# Patient Record
Sex: Female | Born: 1965 | Race: White | Hispanic: Refuse to answer | Marital: Married | Smoking: Current every day smoker
Health system: Southern US, Community
[De-identification: ages and names within clinical notes are randomized; demographics above are authoritative.]

## PROBLEM LIST (undated history)

## (undated) DIAGNOSIS — Z923 Personal history of irradiation: Secondary | ICD-10-CM

## (undated) DIAGNOSIS — F419 Anxiety disorder, unspecified: Secondary | ICD-10-CM

## (undated) DIAGNOSIS — E785 Hyperlipidemia, unspecified: Secondary | ICD-10-CM

## (undated) DIAGNOSIS — F319 Bipolar disorder, unspecified: Secondary | ICD-10-CM

## (undated) DIAGNOSIS — I209 Angina pectoris, unspecified: Secondary | ICD-10-CM

## (undated) DIAGNOSIS — J45909 Unspecified asthma, uncomplicated: Secondary | ICD-10-CM

## (undated) DIAGNOSIS — I2510 Atherosclerotic heart disease of native coronary artery without angina pectoris: Secondary | ICD-10-CM

## (undated) DIAGNOSIS — C50919 Malignant neoplasm of unspecified site of unspecified female breast: Secondary | ICD-10-CM

## (undated) DIAGNOSIS — Z9221 Personal history of antineoplastic chemotherapy: Secondary | ICD-10-CM

## (undated) DIAGNOSIS — K859 Acute pancreatitis without necrosis or infection, unspecified: Secondary | ICD-10-CM

## (undated) DIAGNOSIS — E1169 Type 2 diabetes mellitus with other specified complication: Secondary | ICD-10-CM

## (undated) DIAGNOSIS — E119 Type 2 diabetes mellitus without complications: Secondary | ICD-10-CM

## (undated) DIAGNOSIS — I890 Lymphedema, not elsewhere classified: Secondary | ICD-10-CM

## (undated) DIAGNOSIS — I82401 Acute embolism and thrombosis of unspecified deep veins of right lower extremity: Secondary | ICD-10-CM

## (undated) DIAGNOSIS — M1990 Unspecified osteoarthritis, unspecified site: Secondary | ICD-10-CM

## (undated) DIAGNOSIS — I219 Acute myocardial infarction, unspecified: Secondary | ICD-10-CM

## (undated) DIAGNOSIS — G4730 Sleep apnea, unspecified: Secondary | ICD-10-CM

## (undated) DIAGNOSIS — E669 Obesity, unspecified: Secondary | ICD-10-CM

## (undated) HISTORY — DX: Acute pancreatitis without necrosis or infection, unspecified: K85.90

## (undated) HISTORY — DX: Unspecified osteoarthritis, unspecified site: M19.90

## (undated) HISTORY — PX: UMBILICAL HERNIA REPAIR: SHX196

## (undated) HISTORY — DX: Anxiety disorder, unspecified: F41.9

## (undated) HISTORY — DX: Sleep apnea, unspecified: G47.30

## (undated) HISTORY — PX: TUBAL LIGATION: SHX77

## (undated) HISTORY — PX: TRIGGER FINGER RELEASE: SHX641

## (undated) HISTORY — PX: CATARACT EXTRACTION: SUR2

## (undated) HISTORY — PX: CHOLECYSTECTOMY: SHX55

## (undated) HISTORY — PX: OTHER SURGICAL HISTORY: SHX169

## (undated) HISTORY — DX: Lymphedema, not elsewhere classified: I89.0

## (undated) NOTE — *Deleted (*Deleted)
PROGRESS NOTE    Ariana Herrera   NKN:[id number]  DOB: [birth year]  DOA: 08/20/2020 PCP: Ariana Sellar, MD   Brief Narrative:  Ariana Herrera a 62 y.o.femalewith medical history significant ofCAD andbreastcancertreatedwith chemo/rads and with residual lymphedema presenting with abdominal pain.She reports she was awakened overnight with severe abdominal pain. It got worse and worse for about 2 hours and her boyfriend called 911. +n/v. She felt ok yesterday with mild cramping. She is still having severe substernal pain that radiates into her back. Nothing makes it worse, better with pain meds but it is short-lived. No h/o similar prior. She is very nauseated. She did have an MI in the past (maybe 4 years ago) - presented with chest discomfort, told it might be GERD and they discharged her. About 1-2 days later she went back and they said a stent was not needed but it was a heart attack.   Of note, she was seen at Urgent Care 2 days ago with R sided shoulder pain and chest pain. She took NTG x 2 for this due to concern that it was a heart attack. She reports being told that it was a shoulder strain for which she was given prednisone. She took the 60 mg prednisone yesterday for the first time and symptoms developed overnight. Her only other pain medication was Norco, which she takes regularly for pain. Denies NSAIDs use.    Subjective: Abdominal pain improved. States she is very constipated.    Assessment & Plan:   Principal Problem:   NSTEMI (non-ST elevated myocardial infarction)   - vs related to acute pancreatitis  Ref. Range 08/20/2020 10:27 08/20/2020 13:32 08/20/2020 15:03  Troponin I (High Sensitivity) Latest Ref Range: <18 ng/L 1,534 (HH) 1,529 (HH) 1,415 [facility])  Cardiac Cath 10/29:  Prox RCA lesion is 30% stenosed.  Mid RCA lesion is 40% stenosed.  Mid RCA to Dist RCA lesion is 20% stenosed.  Ost LAD to Prox LAD lesion is 20%  stenosed.   1. Mild non-obstructive CAD 2. Elevated left filling pressures.  - ECHO noted below is unremarkable - no symptoms or signs of vasculitis/ sepsis/ shock/ rhabdomyolysis/ CVA - no history of Cocaine abuse  - no further work up at this time per Cards- will f/u as outpt  Active Problems:     Pancreatitis, acute - presenting with abdominal pain- CT correlated with acute pancreatitis and fatty liver (No h/o ETOH abuse) - having ongoing pain after being started on diet and asking for pain medications - downgraded to NPO- pain improved- will place on clears - advised to have fat free clears only- place on maintenance fluids  Constipation - Dulcolax suppository and Miralax ordered - Fleet enema if no BM today  Hypomagnesemia - replaced-  Labs not drawn today.  Fatty liver  - discussed findings and discussed need for weigh loss with patient  Dyslipidemia- cont Tricor - also related to uncontrolled DM Total CHOL/HDL Ratio    4.7           Total CHOL/HDL Ratio     Cholesterol    135           Cholesterol    HDL Cholesterol    29           HDL Cholesterol    LDL (calc)    61           LDL (calc)    Triglycerides    227  Triglycerides    VLDL    45           VLDL    CBC     Splenic lesion - see CTA report below - non urgent outpatient MRI can be pursued     Diabetes mellitus type 2 in obese (HCC)   - Uncontrolled   - A1c 10.8 - on Metformin at home- will need hold stop Trulicity - cont Lantus- following glucose -  she may benefit from short acting insulin with meals as outpatient     Breast cancer (HCC)  - cont outpt follow up  Time spent in minutes: 35 DVT prophylaxis: SCD's Start: 08/22/20 1322  Code Status: full code Family Communication:  Disposition Plan:  Status is: Inpatient  Remains inpatient appropriate because:acute pancreatitis   Dispo: The patient is from: Home              Anticipated d/c is to: Home              Anticipated d/c  date is: > 3 days              Patient currently is not medically stable to d/c.      Consultants:   cardiology Procedures:  10/28- 2 D ECHO 1. Left ventricular ejection fraction, by estimation, is 60 to 65%. The  left ventricle has normal function. The left ventricle has no regional  wall motion abnormalities. Left ventricular diastolic parameters were  normal.  2. Right ventricular systolic function is normal. The right ventricular  size is normal. Tricuspid regurgitation signal is inadequate for assessing  PA pressure.  3. The mitral valve is normal in structure. Trivial mitral valve  regurgitation. No evidence of mitral stenosis.  4. The aortic valve is tricuspid. Aortic valve regurgitation is not  visualized. No aortic stenosis is present.  5. The inferior vena cava is normal in size with greater than 50%  respiratory variability, suggesting right atrial pressure of 3 mmHg.  Antimicrobials:  Anti-infectives (From admission, onward)   None       Objective: Vitals:   08/24/20 2038 08/25/20 0342 08/25/20 0919 08/25/20 1109  BP: 124/75 127/77 (!) 147/90 123/82  Pulse: 86 88 92 92  Resp: 20 16 18 20   Temp: 98.8 F (37.1 C) 98.7 F (37.1 C) 97.8 F (36.6 C) 98.4 F (36.9 C)  TempSrc: Oral Oral Oral Oral  SpO2: 95% 97% 99% 97%  Weight:  72.7 kg    Height:        Intake/Output Summary (Last 24 hours) at 08/25/2020 1633 Last data filed at 08/25/2020 1606 Gross per 24 hour  Intake 3379.96 ml  Output 3625 ml  Net -245.04 ml   Filed Weights   08/23/20 0102 08/24/20 0059 08/25/20 0342  Weight: 73.8 kg 72.4 kg 72.7 kg    Examination: General exam: Appears comfortable  HEENT: PERRLA, oral mucosa moist, no sclera icterus or thrush Respiratory system: Clear to auscultation. Respiratory effort normal. Cardiovascular system: S1 & S2 heard,  No murmurs  Gastrointestinal system: Abdomen soft, epigastric tenderness today, nondistended. Normal bowel sounds    Central nervous system: Alert and oriented. No focal neurological deficits. Extremities: No cyanosis, clubbing or edema Skin: No rashes or ulcers Psychiatry:  Mood & affect appropriate.     Data Reviewed: I have personally reviewed following labs and imaging studies  CBC: Recent Labs  Lab 08/20/20 1027 08/20/20 1040 08/21/20 0558 08/22/20 0016 08/23/20 0558  WBC 12.8*  --  9.2  6.3 5.1  HGB 13.2 13.6 12.2 10.7* 11.9*  HCT 38.3 40.0 35.7* 31.5* 35.2*  MCV 89.3  --  88.8 89.2 90.5  PLT 408*  --  327 263 273   Basic Metabolic Panel: Recent Labs  Lab 08/21/20 0558 08/22/20 0016 08/23/20 0558 08/24/20 1454 08/25/20 0954  NA 136 137 137 136 137  K 3.6 3.3* 3.8 5.1 5.4*  CL 106 106 106 105 109  CO2 24 22 22 23 22   GLUCOSE 103* 68* 173* 285* 209*  BUN 9 6 5* 5* <5*  CREATININE 0.71 0.66 0.77 0.64 0.58  CALCIUM 8.6* 8.6* 9.0 8.6* 8.5*  MG 1.7 1.5* 1.5* 1.3* 1.2*   GFR: Estimated Creatinine Clearance: 77.6 mL/min (by C-G formula based on SCr of 0.58 mg/dL). Liver Function Tests: Recent Labs  Lab 08/20/20 1223 08/23/20 0558  AST 23 13*  ALT 23 15  ALKPHOS 72 60  BILITOT 0.9 0.8  PROT 6.1* 5.8*  ALBUMIN 3.4* 2.8*   Recent Labs  Lab 08/20/20 1223 08/22/20 0016 08/23/20 0558  LIPASE 148* 57* 48   No results for input(s): AMMONIA in the last 168 hours. Coagulation Profile: Recent Labs  Lab 08/20/20 1027 08/21/20 0558  INR 1.0 1.1   Cardiac Enzymes: No results for input(s): CKTOTAL, CKMB, CKMBINDEX, TROPONINI in the last 168 hours. BNP (last 3 results) No results for input(s): PROBNP in the last 8760 hours. HbA1C: No results for input(s): HGBA1C in the last 72 hours. CBG: Recent Labs  Lab 08/24/20 1720 08/24/20 2114 08/25/20 0615 08/25/20 1110 08/25/20 1605  GLUCAP 167* 162* 190* 221* 187*   Lipid Profile: No results for input(s): CHOL, HDL, LDLCALC, TRIG, CHOLHDL, LDLDIRECT in the last 72 hours. Thyroid Function Tests: No results for input(s):  TSH, T4TOTAL, FREET4, T3FREE, THYROIDAB in the last 72 hours. Anemia Panel: No results for input(s): VITAMINB12, FOLATE, FERRITIN, TIBC, IRON, RETICCTPCT in the last 72 hours. Urine analysis:    Component Value Date/Time   COLORURINE STRAW (A) 08/25/2020 1221   APPEARANCEUR CLEAR 08/25/2020 1221   LABSPEC 1.004 (L) 08/25/2020 1221   PHURINE 6.0 08/25/2020 1221   GLUCOSEU 50 (A) 08/25/2020 1221   HGBUR NEGATIVE 08/25/2020 1221   BILIRUBINUR NEGATIVE 08/25/2020 1221   KETONESUR NEGATIVE 08/25/2020 1221   PROTEINUR NEGATIVE 08/25/2020 1221   NITRITE NEGATIVE 08/25/2020 1221   LEUKOCYTESUR NEGATIVE 08/25/2020 1221   Sepsis Labs: @LABRCNTIP (procalcitonin:4,lacticidven:4) ) Recent Results (from the past 240 hour(s))  Respiratory Panel by RT PCR (Flu A&B, Covid) - Nasopharyngeal Swab     Status: None   Collection Time: 08/20/20 11:47 AM   Specimen: Nasopharyngeal Swab  Result Value Ref Range Status   SARS Coronavirus 2 by RT PCR NEGATIVE NEGATIVE Final    Comment: (NOTE) SARS-CoV-2 target nucleic acids are NOT DETECTED.  The SARS-CoV-2 RNA is generally detectable in upper respiratoy specimens during the acute phase of infection. The lowest concentration of SARS-CoV-2 viral copies this assay can detect is 131 copies/mL. A negative result does not preclude SARS-Cov-2 infection and should not be used as the sole basis for treatment or other patient management decisions. A negative result may occur with  improper specimen collection/handling, submission of specimen other than nasopharyngeal swab, presence of viral mutation(s) within the areas targeted by this assay, and inadequate number of viral copies (<131 copies/mL). A negative result must be combined with clinical observations, patient history, and epidemiological information. The expected result is Negative.  Fact Sheet for Patients:  https://www.moore.com/  Fact Sheet for Healthcare  Providers:   https://www.young.biz/  This test is no t yet approved or cleared by the [place] and  has been authorized for detection and/or diagnosis of SARS-CoV-2 by FDA under an Emergency Use Authorization (EUA). This EUA will remain  in effect (meaning this test can be used) for the duration of the COVID-19 declaration under Section 564(b)(1) of the Act, 21 U.S.C. section 360bbb-3(b)(1), unless the authorization is terminated or revoked sooner.     Influenza A by PCR NEGATIVE NEGATIVE Final   Influenza B by PCR NEGATIVE NEGATIVE Final    Comment: (NOTE) The Xpert Xpress SARS-CoV-2/FLU/RSV assay is intended as an aid in  the diagnosis of influenza from Nasopharyngeal swab specimens and  should not be used as a sole basis for treatment. Nasal washings and  aspirates are unacceptable for Xpert Xpress SARS-CoV-2/FLU/RSV  testing.  Fact Sheet for Patients: https://www.moore.com/  Fact Sheet for Healthcare Providers: https://www.young.biz/  This test is not yet approved or cleared by the [place] FDA and  has been authorized for detection and/or diagnosis of SARS-CoV-2 by  FDA under an Emergency Use Authorization (EUA). This EUA will remain  in effect (meaning this test can be used) for the duration of the  Covid-19 declaration under Section 564(b)(1) of the Act, 21  U.S.C. section 360bbb-3(b)(1), unless the authorization is  terminated or revoked. Performed at [facility], [address], [place], [place] [address]          Radiology Studies: No results found.    Scheduled Meds: . aspirin EC  81 mg Oral Daily  . atorvastatin  40 mg Oral Daily  . HYDROcodone-acetaminophen  1 tablet Oral TID  . insulin aspart  0-15 Units Subcutaneous TID WC  . insulin glargine  6 Units Subcutaneous QHS  . losartan  25 mg Oral Daily  . metoprolol succinate  25 mg Oral Daily  . nicotine  21 mg Transdermal Daily  .  pantoprazole (PROTONIX) IV  40 mg Intravenous Q12H  . pneumococcal 23 valent vaccine  0.5 mL Intramuscular Tomorrow-1000  . polyethylene glycol  17 g Oral Daily  . QUEtiapine  800 mg Oral QHS  . sodium chloride flush  3 mL Intravenous Q12H   Continuous Infusions: . sodium chloride       LOS: 5 days      Calvert Cantor, MD [facility] Pager: [email or web address] 08/25/2020, 4:33 PM

---

## 2019-03-26 HISTORY — PX: BREAST LUMPECTOMY: SHX2

## 2019-11-09 ENCOUNTER — Telehealth: Payer: Self-pay | Admitting: Hematology

## 2019-11-09 NOTE — Telephone Encounter (Signed)
Unable to reach patient to inform of new patient appointment 1/26 at 12 pm. Mailed appointment letter 11/09/19. Call [facility] to inform referral coordinator of appt date/time/location. Also, informed them that we will need patient records from previous oncologist per MD request.

## 2019-11-20 ENCOUNTER — Inpatient Hospital Stay: Payer: Medicaid Other

## 2019-11-20 ENCOUNTER — Ambulatory Visit: Payer: Medicaid Other | Admitting: Hematology

## 2019-11-26 ENCOUNTER — Other Ambulatory Visit: Payer: Self-pay | Admitting: *Deleted

## 2019-11-26 ENCOUNTER — Inpatient Hospital Stay (HOSPITAL_BASED_OUTPATIENT_CLINIC_OR_DEPARTMENT_OTHER): Payer: Medicaid Other | Admitting: Hematology

## 2019-11-26 ENCOUNTER — Inpatient Hospital Stay: Payer: Medicaid Other | Attending: Hematology

## 2019-11-26 ENCOUNTER — Other Ambulatory Visit: Payer: Self-pay

## 2019-11-26 ENCOUNTER — Encounter: Payer: Self-pay | Admitting: Hematology

## 2019-11-26 ENCOUNTER — Inpatient Hospital Stay: Payer: Medicaid Other

## 2019-11-26 ENCOUNTER — Inpatient Hospital Stay: Payer: Medicaid Other | Admitting: Hematology

## 2019-11-26 VITALS — BP 133/76 | HR 108 | Temp 97.9°F | Resp 18 | Ht 62.0 in | Wt 151.1 lb

## 2019-11-26 DIAGNOSIS — T451X5A Adverse effect of antineoplastic and immunosuppressive drugs, initial encounter: Secondary | ICD-10-CM | POA: Diagnosis not present

## 2019-11-26 DIAGNOSIS — C50912 Malignant neoplasm of unspecified site of left female breast: Secondary | ICD-10-CM | POA: Insufficient documentation

## 2019-11-26 DIAGNOSIS — Z9221 Personal history of antineoplastic chemotherapy: Secondary | ICD-10-CM | POA: Diagnosis not present

## 2019-11-26 DIAGNOSIS — Z720 Tobacco use: Secondary | ICD-10-CM

## 2019-11-26 DIAGNOSIS — Z171 Estrogen receptor negative status [ER-]: Secondary | ICD-10-CM

## 2019-11-26 DIAGNOSIS — C50919 Malignant neoplasm of unspecified site of unspecified female breast: Secondary | ICD-10-CM | POA: Insufficient documentation

## 2019-11-26 DIAGNOSIS — Z79899 Other long term (current) drug therapy: Secondary | ICD-10-CM | POA: Diagnosis not present

## 2019-11-26 DIAGNOSIS — F17210 Nicotine dependence, cigarettes, uncomplicated: Secondary | ICD-10-CM | POA: Diagnosis not present

## 2019-11-26 DIAGNOSIS — Z923 Personal history of irradiation: Secondary | ICD-10-CM | POA: Insufficient documentation

## 2019-11-26 DIAGNOSIS — I824Y1 Acute embolism and thrombosis of unspecified deep veins of right proximal lower extremity: Secondary | ICD-10-CM | POA: Diagnosis not present

## 2019-11-26 DIAGNOSIS — Z7984 Long term (current) use of oral hypoglycemic drugs: Secondary | ICD-10-CM | POA: Diagnosis not present

## 2019-11-26 DIAGNOSIS — Z86718 Personal history of other venous thrombosis and embolism: Secondary | ICD-10-CM | POA: Insufficient documentation

## 2019-11-26 DIAGNOSIS — Z7901 Long term (current) use of anticoagulants: Secondary | ICD-10-CM | POA: Diagnosis not present

## 2019-11-26 DIAGNOSIS — G620 Drug-induced polyneuropathy: Secondary | ICD-10-CM | POA: Insufficient documentation

## 2019-11-26 DIAGNOSIS — I82401 Acute embolism and thrombosis of unspecified deep veins of right lower extremity: Secondary | ICD-10-CM | POA: Insufficient documentation

## 2019-11-26 LAB — CBC WITH DIFFERENTIAL (CANCER CENTER ONLY)
Abs Immature Granulocytes: 0.04 10*3/uL (ref 0.00–0.07)
Basophils Absolute: 0 10*3/uL (ref 0.0–0.1)
Basophils Relative: 1 %
Eosinophils Absolute: 0.1 10*3/uL (ref 0.0–0.5)
Eosinophils Relative: 1 %
HCT: 40.6 % (ref 36.0–46.0)
Hemoglobin: 14.1 g/dL (ref 12.0–15.0)
Immature Granulocytes: 1 %
Lymphocytes Relative: 25 %
Lymphs Abs: 1.8 10*3/uL (ref 0.7–4.0)
MCH: 31.4 pg (ref 26.0–34.0)
MCHC: 34.7 g/dL (ref 30.0–36.0)
MCV: 90.4 fL (ref 80.0–100.0)
Monocytes Absolute: 0.5 10*3/uL (ref 0.1–1.0)
Monocytes Relative: 7 %
Neutro Abs: 4.8 10*3/uL (ref 1.7–7.7)
Neutrophils Relative %: 65 %
Platelet Count: 313 10*3/uL (ref 150–400)
RBC: 4.49 MIL/uL (ref 3.87–5.11)
RDW: 13.2 % (ref 11.5–15.5)
WBC Count: 7.2 10*3/uL (ref 4.0–10.5)
nRBC: 0 % (ref 0.0–0.2)

## 2019-11-26 LAB — CMP (CANCER CENTER ONLY)
ALT: 13 U/L (ref 0–44)
AST: 12 U/L — ABNORMAL LOW (ref 15–41)
Albumin: 4 g/dL (ref 3.5–5.0)
Alkaline Phosphatase: 123 U/L (ref 38–126)
Anion gap: 9 (ref 5–15)
BUN: 8 mg/dL (ref 6–20)
CO2: 25 mmol/L (ref 22–32)
Calcium: 9.4 mg/dL (ref 8.9–10.3)
Chloride: 104 mmol/L (ref 98–111)
Creatinine: 0.72 mg/dL (ref 0.44–1.00)
GFR, Est AFR Am: >60 mL/min (ref >60)
GFR, Estimated: >60 mL/min (ref >60)
Glucose, Bld: 175 mg/dL — ABNORMAL HIGH (ref 70–99)
Potassium: 4.5 mmol/L (ref 3.5–5.1)
Sodium: 138 mmol/L (ref 135–145)
Total Bilirubin: 0.3 mg/dL (ref 0.3–1.2)
Total Protein: 6.8 g/dL (ref 6.5–8.1)

## 2019-11-26 NOTE — Progress Notes (Addendum)
Ariana Herrera CONSULT NOTE  Patient Care Team: Ariana Sellar, MD as PCP - General (General Practice)  HEME/ONC OVERVIEW: 1. 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 RLE DVT -07/2018: acute DVT in the R gastrocnemius vein with slight extension to the popliteal vein  On Eliquis 7m BID  2. 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  She underwent left lumpectomy with sentinel lymph node biopsy on 07/18/2018.  Pathology showed invasive ductal carcinoma, Grade 3, tumor measuring 2.1 cm with negative margins and sentinel lymph node.  She received adjuvant Adriamycin/Cytoxan x4 cycles, followed by weekly Taxol x 10 cycles.  Her Taxol was  discontinued due to chemotherapy associated neuropathy.  She also received adjuvant radiation to the left breast and axilla, which she completed in late 02/2019.  Her surveillance mammogram in late May 2020 showed some questionable calcifications in the upper outer quadrant of the left breast, approximately 6 cm from the nipple, suspicious for malignancy.  She underwent a biopsy, which showed atrophic breast parenchyma with stromal fibrosis, and there was no atypical hyperplasia or malignancy.  [facility] and [facility] for [facility] of care. -I reviewed the imaging and pathology results in detail with the patient, as well as NCCN guideline -Given the triple negative breast cancer s/p adjuvant chemotherapy radiation, she does not require any further treatment, such as endocrine therapy, due to negative hormone and HER2 receptor status -As triple negative breast cancer is at slightly higher risk of disease recurrence then hormone positive breast cancer, I emphasized the importance of self breast exam and yearly surveillance mammogram (next due in 02/2019) -We will plan to see her q643monthwith breast exam x 5 years, and then annually -I also counseled the patient on the importance of maintaining an active lifestyle, healthy diet, limited alcohol intake, and tobacco cessation (see below)  RLE DVT -Diagnosed in 07/2018 in the distal right lower extremity -I reviewed with the patient about the plan for care for DVT  -This last episode of blood clot appeared to be provoked.  -Patient is tolerating Eliquis well without any abnormal bleeding or bruising -As she does not have a history of DVT or PTE, and her distal left lower extremity DVT was provoked in the setting of surgery and prolonged immobility, the duration of anticoagulation is generally 3-6 months -  I ordered Doppler of the right lower extremity, which was negative for DVT  -Therefore, I have  instructed the patient to stop anticoagulation  -I reinforced the importance of preventive strategies such as avoiding hormonal supplement, avoiding cigarette smoking, keeping up-to-date with screening programs for early cancer detection, frequent ambulation for long distance travel and aggressive DVT prophylaxis in all surgical settings.  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 -Neuropathy overall stable  -Currently on gabapentin -I have referred the patient to outpatient cancer rehab for physical therapy -I also encouraged patient to continue follow-up with her PCP for adjustment of her gabapentin -If her neuropathy worsens in the future, I encouraged her to discuss with her PCP regarding neurology referral for further management  Tobacco abuse -Patient currently smokes 1.5 to 2 ppd for past 40 years -I counseled the patient on the importance of tobacco cessation, especially in the setting of breast cancer and DVT -We discussed some of the pharmacologic interventions, including nicotine patch, gums, lozenges, and medications (such as Chantix) -Patient will discuss with her PCP for further management of tobacco cessation   Port-A-Cath in place -As patient has completed all her chemotherapy, and there is no evidence of recurrent disease, I have ordered the port to be removed  Orders Placed This Encounter  Procedures  . IR REMOVAL TUN ACCESS W/ PORT W/O FL MOD SED    Standing Status:   Future    Standing Expiration Date:   01/23/2021    Order Specific Question:   Reason for exam:    Answer:   Completed chemotherapy    Order Specific Question:   Preferred Imaging Location?    Answer:   [facility]    Order Specific Question:   Is the patient pregnant?    Answer:   No  . MM DIAG BREAST TOMO BILATERAL    Standing Status:   Future    Standing Expiration Date:   11/25/2020    Order Specific Question:   Reason for Exam (SYMPTOM  OR DIAGNOSIS REQUIRED)    Answer:    History of triple-negative breast cancer    Order Specific Question:   Is the patient pregnant?    Answer:   No    Order Specific Question:   Preferred imaging location?    Answer:   [facility]  . CBC with Differential (Cancer Center Only)    Standing Status:   Future    Standing Expiration Date:   12/30/2020  . CMP ([place] only)    Standing Status:   Future    Standing Expiration Date:   12/30/2020  . AMB referral to rehabilitation    Referral Priority:   Routine    Referral Type:   Consultation    Number of Visits Requested:   1    The total time spent in the appointment was 80 minutes encounter with patients including review of chart and various tests results, discussions about plan of care and coordination of care plan  All questions were answered. The patient knows to call the clinic with any problems, questions or concerns. No barriers to learning was detected.  Return in 6 months for labs, breast exam, and clinic appointment.   Tish Men, MD 2/1/20219:47 AM  CHIEF COMPLAINTS/PURPOSE OF CONSULTATION:  "I am doing okay*"  HISTORY OF PRESENTING ILLNESS:  Ariana Herrera 54 y.o. female is here because of history of Stage II triple negative breast cancer on surveillance.  Patient was diagnosed with triple-negative invasive ductal carcinoma of  the left breast in late 2019 in [place].  Staging scans, including CT and bone scan, were negative for metastatic disease.  She underwent left lumpectomy with sentinel lymph node biopsy on 07/18/2018.  Pathology showed invasive ductal carcinoma, Grade 3, tumor measuring 2.1 cm with negative margins and sentinel lymph node.  She received adjuvant Adriamycin/Cytoxan x4 cycles, followed by weekly Taxol x 10 cycles.  Her Taxol was discontinued due to chemotherapy associated neuropathy.  She also received adjuvant radiation to the left breast and axilla, which she completed in late 02/2019.  Her surveillance mammogram in late May 2020 showed  some questionable calcifications in the upper outer quadrant of the left breast, approximately 6 cm from the nipple, suspicious for malignancy.  She underwent a biopsy, which showed atrophic breast parenchyma with stromal fibrosis, and there was no atypical hyperplasia or malignancy.  [facility] after completing her treatment, and presented for establishment of care.  Patient reports that she has chronic numbness/tingling sensation in the fingertips and feet, as well as some burning sensation in the feet.  She also has had a few falls, usually with walking down stairs, due to "buckling of knee).  She also has some periodic bone aches, but her bone scan in 2020 was negative for any metastatic disease.  She takes Eliquis 5 mg BID for history of right lower extremity DVT, and denies any abnormal bleeding or bruising.  She currently smokes 1.5 to 2 ppds for the past 40 years.  She denies any other complaint today.  REVIEW OF SYSTEMS:   Constitutional: ( - ) fevers, ( - )  chills , ( - ) night sweats Eyes: ( - ) blurriness of vision, ( - ) double vision, ( - ) watery eyes Ears, nose, mouth, throat, and face: ( - ) mucositis, ( - ) sore throat Respiratory: ( - ) cough, ( - ) dyspnea, ( - ) wheezes Cardiovascular: ( - ) palpitation, ( - ) chest discomfort, ( - ) lower extremity swelling Gastrointestinal:  ( - ) nausea, ( - ) heartburn, ( - ) change in bowel habits Skin: ( - ) abnormal skin rashes Lymphatics: ( - ) new lymphadenopathy, ( - ) easy bruising Neurological: ( + ) numbness, ( + ) tingling, ( - ) new weaknesses Behavioral/Psych: ( - ) mood change, ( - ) new changes  All other systems were reviewed with the patient and are negative.  I have reviewed her chart and materials related to her cancer extensively and collaborated history with the patient. Summary of oncologic history is as follows: Oncology History  Breast cancer ([place])  11/26/2019 Initial Diagnosis   Breast cancer  ([place])   11/26/2019 Cancer Staging   Staging form: Breast, AJCC 8th Edition - Pathologic: Stage IIA (pT2, pN0, cM0, G3, ER-, PR-, HER2-) - Signed by Tish Men, MD on 11/26/2019     MEDICAL HISTORY:  History reviewed. No pertinent past medical history.  SURGICAL HISTORY: History reviewed. No pertinent surgical history.  SOCIAL HISTORY: Social History   Socioeconomic History  . Marital status: Unknown    Spouse name: Not on file  . Number of children: Not on file  . Years of education: Not on file  . Highest education level: Not on file  Occupational History  . Not on file  Tobacco Use  . Smoking status: Current Every Day Smoker    Packs/day: 2.00    Types: Cigarettes    Start date: 11/26/2019  . Smokeless  tobacco: Never Used  Substance and Sexual Activity  . Alcohol use: Not on file  . Drug use: Not on file  . Sexual activity: Not on file  Other Topics Concern  . Not on file  Social History Narrative  . Not on file   Social Determinants of Health   Financial Resource Strain:   . Difficulty of Paying Living Expenses: Not on file  Food Insecurity:   . Worried About Charity fundraiser in the Last Year: Not on file  . Ran Out of Food in the Last Year: Not on file  Transportation Needs:   . Lack of Transportation (Medical): Not on file  . Lack of Transportation (Non-Medical): Not on file  Physical Activity:   . Days of Exercise per Week: Not on file  . Minutes of Exercise per Session: Not on file  Stress:   . Feeling of Stress : Not on file  Social Connections:   . Frequency of Communication with Friends and Family: Not on file  . Frequency of Social Gatherings with Friends and Family: Not on file  . Attends Religious Services: Not on file  . Active Member of Clubs or Organizations: Not on file  . Attends Archivist Meetings: Not on file  . Marital Status: Not on file  Intimate Partner Violence:   . Fear of Current or Ex-Partner: Not on file  .  Emotionally Abused: Not on file  . Physically Abused: Not on file  . Sexually Abused: Not on file    FAMILY HISTORY: History reviewed. No pertinent family history.  ALLERGIES:  is allergic to lithium and penicillins.  MEDICATIONS:  Current Outpatient Medications  Medication Sig Dispense Refill  . apixaban (ELIQUIS) 5 MG TABS tablet Take 5 mg by mouth 2 (two) times daily.    . cyclobenzaprine (FLEXERIL) 10 MG tablet Take 10 mg by mouth 2 (two) times daily.    Marland Kitchen gabapentin (NEURONTIN) 800 MG tablet Take 800 mg by mouth 3 (three) times daily.    Marland Kitchen glipiZIDE (GLUCOTROL) 10 MG tablet Take 10 mg by mouth 2 (two) times daily before a meal.    . HYDROcodone-acetaminophen (NORCO/VICODIN) 5-325 MG tablet Take 1 tablet by mouth 2 (two) times daily.    Marland Kitchen ibuprofen (ADVIL) 400 MG tablet Take 400 mg by mouth 3 (three) times daily.    . metFORMIN (GLUCOPHAGE) 1000 MG tablet Take 1,000 mg by mouth 2 (two) times daily with a meal.    . omeprazole (PRILOSEC) 40 MG capsule Take 40 mg by mouth daily.     No current facility-administered medications for this visit.    PHYSICAL EXAMINATION: ECOG PERFORMANCE STATUS: 1 - Symptomatic but completely ambulatory  Vitals:   11/26/19 0904  BP: 133/76  Pulse: (!) 108  Resp: 18  Temp: 97.9 F (36.6 C)  SpO2: 98%   Filed Weights   11/26/19 0904  Weight: 151 lb 1.3 oz (68.5 kg)    GENERAL: alert, no distress and comfortable SKIN: skin color, texture, turgor are normal, no rashes or significant lesions EYES: conjunctiva are pink and non-injected, sclera clear OROPHARYNX: no exudate, no erythema; lips, buccal mucosa, and tongue normal  NECK: supple, non-tender LYMPH:  no palpable lymphadenopathy in the cervical LUNGS: clear to auscultation with normal breathing effort HEART: regular rate & rhythm, no murmurs, no lower extremity edema ABDOMEN: soft, non-tender, non-distended, normal bowel sounds Musculoskeletal: no cyanosis of digits and no clubbing   PSYCH: alert & oriented x 3, fluent speech  BREAST: left lumpectomy scar well healed, no abnormal mass palpated in either breast, no axillary adenopathy  LABORATORY DATA:  I have reviewed the data as listed Lab Results  Component Value Date   WBC 7.2 11/26/2019   HGB 14.1 11/26/2019   HCT 40.6 11/26/2019   MCV 90.4 11/26/2019   PLT 313 11/26/2019   Lab Results  Component Value Date   NA 138 11/26/2019   K 4.5 11/26/2019   CL 104 11/26/2019   CO2 25 11/26/2019    RADIOGRAPHIC STUDIES: I have personally reviewed the radiological images as listed and agreed with the findings in the report. No results found.  PATHOLOGY: I have reviewed the pathology reports as documented in the oncologist history.

## 2019-11-28 ENCOUNTER — Ambulatory Visit: Payer: Medicaid Other | Admitting: Physical Therapy

## 2019-11-29 ENCOUNTER — Ambulatory Visit (HOSPITAL_BASED_OUTPATIENT_CLINIC_OR_DEPARTMENT_OTHER)
Admission: RE | Admit: 2019-11-29 | Discharge: 2019-11-29 | Disposition: A | Discharge status: Home / Self Care | Payer: Medicaid Other | Source: Ambulatory Visit | Attending: Hematology | Admitting: Hematology

## 2019-11-29 ENCOUNTER — Other Ambulatory Visit: Payer: Self-pay

## 2019-11-29 DIAGNOSIS — I824Y1 Acute embolism and thrombosis of unspecified deep veins of right proximal lower extremity: Secondary | ICD-10-CM | POA: Insufficient documentation

## 2019-11-29 NOTE — Progress Notes (Signed)
VAS [place] LOWER EXTREMITY VENOUS BILAT (DVT)  11/29/19 Cardell Peach RDCS, RVT

## 2019-11-30 ENCOUNTER — Telehealth: Payer: Self-pay | Admitting: *Deleted

## 2019-11-30 NOTE — Telephone Encounter (Signed)
-----   Message from Tish Men, MD sent at 11/30/2019  9:06 AM EST ----- Graceann Congress,  Can you let Ms. Jones know that her doppler was negative for DVT, and she can stop her anticoagulation?Thanks.  GZ  ----- Message ----- From: Interface, [employer] Sent: 11/29/2019   5:34 PM EST To: Tish Men, MD

## 2019-11-30 NOTE — Telephone Encounter (Signed)
Unable to reach pt, no working phone numbers on file

## 2019-12-03 ENCOUNTER — Ambulatory Visit: Payer: Medicaid Other | Attending: Hematology | Admitting: Rehabilitation

## 2019-12-03 ENCOUNTER — Other Ambulatory Visit: Payer: Self-pay

## 2019-12-03 ENCOUNTER — Encounter: Payer: Self-pay | Admitting: Rehabilitation

## 2019-12-03 DIAGNOSIS — M6281 Muscle weakness (generalized): Secondary | ICD-10-CM | POA: Insufficient documentation

## 2019-12-03 DIAGNOSIS — T451X5A Adverse effect of antineoplastic and immunosuppressive drugs, initial encounter: Secondary | ICD-10-CM | POA: Diagnosis present

## 2019-12-03 DIAGNOSIS — G620 Drug-induced polyneuropathy: Secondary | ICD-10-CM | POA: Diagnosis present

## 2019-12-03 DIAGNOSIS — R296 Repeated falls: Secondary | ICD-10-CM | POA: Diagnosis present

## 2019-12-03 NOTE — Therapy (Signed)
[place], [place], [address] Phone: [phone]   Fax:  [phone]  Physical Therapy Treatment  Patient Details  Name: Ariana Herrera MRN: [id number] Date of Birth: 19-Feb-1966 Referring Provider (PT): Dr. Maylon Peppers   Encounter Date: 12/03/2019  PT End of Session - 12/03/19 1150    Visit Number  1    Number of Visits  13   POC visits 13   Date for PT Re-Evaluation  01/14/20    Authorization Type  Medicaid    PT Start Time  1100    PT Stop Time  1150    PT Time Calculation (min)  50 min    Activity Tolerance  Patient tolerated treatment well    Behavior During Therapy  [facility] for tasks assessed/performed       History reviewed. No pertinent past medical history.  History reviewed. No pertinent surgical history.  There were no vitals filed for this visit.  Subjective Assessment - 12/03/19 1103    Subjective  The numbness and tingling started with the taxotere,  I had to use the walker and my knees started giving out.  It has gotten worse since then.  My hands and the from the knees down are completely numb and 1-2x per week I go numb from the neck down.    Pertinent History  DM, bipolar disorder, high cholesterol, asthma, history of blood clots, breast cancer treatment completed lumpectomy on the left with SLNB 0/1 nodes removed.  Chemo completed except for 3 sessions and radiation completed all in [place].    Currently in Pain?  No/denies   It only hurts when I'm walking up to 8/10 both feet        [facility] PT Assessment - 12/03/19 0001      Assessment   Medical Diagnosis  CIPN    Referring Provider (PT)  Dr. Maylon Peppers    Onset Date/Surgical Date  07/18/18    Hand Dominance  Right    Prior Therapy  no      Precautions   Precaution Comments  fall       Balance Screen   Has the patient fallen in the past 6 months  Yes    How many times?  5    Has the patient had a decrease in activity level because  of a fear of falling?   Yes    Is the patient reluctant to leave their home because of a fear of falling?   Yes      [place]  Private residence    Living Arrangements  Spouse/significant other    Available Help at Discharge  Friend(s)    [place] to enter    Entrance Stairs-Number of Steps  3   I go [place]  One level    Additional Comments  I can only go down the stairs sideways because my knees give out      Prior Function   Level of Independence  Independent    Vocation  On disability      Cognition   Overall Cognitive Status  Within Functional Limits for tasks assessed      Sensation   Additional Comments  not able to feel monofilament Rt LE: toes 1-3 and anterior shin, LT LE: toes 2-3 and anterior shin      Coordination   Gross Motor Movements are Fluid and Coordinated  No    Coordination  and Movement Description  overall shakiness with LE gross movements    Finger Nose Finger Test  WNL    Heel Shin Test  WNL      ROM / Strength   AROM / PROM / Strength  Strength      Strength   Overall Strength Comments  SLR Rt lag of about 10deg, left lag of about 10deg with lots of shaking    Strength Assessment Site  Hip;Knee;Ankle    Right/Left Hip  Right;Left    Right Hip ABduction  4+/5   shaky   Left Hip ABduction  5/5    Right/Left Knee  Right;Left    Right Knee Flexion  4/5   slight cogwheel   Right Knee Extension  4+/5    Left Knee Flexion  4/5   slight cogwheel   Left Knee Extension  4+/5    Right/Left Ankle  Right;Left    Right Ankle Dorsiflexion  5/5    Right Ankle Plantar Flexion  4/5   gets arch cramps with PF bilaterally   Left Ankle Dorsiflexion  5/5    Left Ankle Plantar Flexion  4/5      Ambulation/Gait   Gait Comments  walks without evident deviation      Standardized Balance Assessment   Standardized Balance Assessment  Timed Up and Go Test;Berg Balance Test      Berg Balance Test   Sit to  Stand  Able to stand using hands after several tries    Standing Unsupported  Able to stand safely 2 minutes    Sitting with Back Unsupported but Feet Supported on Floor or Stool  Able to sit safely and securely 2 minutes    Stand to Sit  Sits safely with minimal use of hands    Transfers  Able to transfer safely, definite need of hands    Standing Unsupported with Eyes Closed  Able to stand 10 seconds with supervision    Standing Unsupported with Feet Together  Able to place feet together independently and stand for 1 minute with supervision    From Standing, Reach Forward with Outstretched Arm  Can reach confidently >25 cm (10")    From Standing Position, Pick up Object from Floor  Unable to pick up and needs supervision    From Standing Position, Turn to Look Behind Over each Shoulder  Needs assist to keep from losing balance and falling    Turn 360 Degrees  Needs assistance while turning    Standing Unsupported, Alternately Place Feet on Step/Stool  Able to complete >2 steps/needs minimal assist    Standing Unsupported, One Foot in [employer] balance while stepping or standing    Standing on One Leg  Unable to try or needs assist to prevent fall    Total Score  29                           PT Education - 12/03/19 1150    Education Details  high fall risk, walker use, POC, seated exercises to begin    Person(s) Educated  Patient    Methods  Explanation;Demonstration;Verbal cues;Handout    Comprehension  Verbalized understanding          PT Long Term Goals - 12/03/19 1201      PT LONG TERM GOAL #1   Title  Pt will improve BERG to at least 45/56    Baseline  26/56    Time  6    Period  Weeks    Status  New      PT LONG TERM GOAL #2   Title  Pt will demonstrate SLR with no quad lag and shakiness bilaterally    Time  6    Period  Weeks    Status  New      PT LONG TERM GOAL #3   Title  Pt will perform stairs or step up/down in the clinic safely with no  knee buckling    Time  6    Period  Weeks    Status  New      PT LONG TERM GOAL #4   Title  pt will be ind with LRAD as needed for ambulation    Time  6    Period  Weeks    Status  New            Plan - 12/03/19 1152    Clinical Impression Statement  Pt presents post left breast cancer treatment with continued LE Rt>Lt weakness and decreased sensation post chemotherapy with taxotere.  Pt continues with numbness noted in toes 1-3 and the anterior shin but also reports intermittent episodes of numbness from the neck down which is unlike CIPN presentation.  Pt reports Dr. Maylon Peppers may be also sending her to a neurologist which PT would also agree.  Pt also reports history of stroke/TIA in the past.  Examination today showing 26/56 on the BERG and pt was counseled on the use of a RW for safety.  LE strength testing showing more shakiness in the LE Rt>Lt limiting holds vs weakness but pt does have a bit of quad lag present in supine.  LOB posteriorly onto the mat x 1 with BERG testing.    Personal Factors and Comorbidities  Fitness;Behavior Pattern;Comorbidity 2    Comorbidities  odd presentation of symptoms, chemo reaction    Examination-Activity Limitations  Stairs;Locomotion Level    Examination-Participation Restrictions  Yard Work;Cleaning;Meal Prep;Community Activity;Driving;Laundry;Shop    Stability/Clinical Decision Making  Evolving/Moderate complexity    Clinical Decision Making  Moderate    Rehab Potential  Fair    PT Frequency  2x / week    PT Duration  6 weeks   after 3 initial medicaid visits   PT Treatment/Interventions  ADLs/Self Care Home Management;Therapeutic exercise;Patient/family education;Neuromuscular re-education;Gait training;DME Instruction;Balance training    PT Next Visit Plan  mat strengthening exercises for quads and LEs, TUG?, balance and gait work using care for falls    PT Home Exercise Plan  Access Code: [email or web address]    Consulted and Agree with Plan of Care   Patient       Patient will benefit from skilled therapeutic intervention in order to improve the following deficits and impairments:  Abnormal gait, Pain, Decreased mobility, Decreased activity tolerance, Decreased strength, Decreased balance  Visit Diagnosis: Muscle weakness (generalized)  Repeated falls  Chemotherapy-induced neuropathy ([place])     Problem List Patient Active Problem List   Diagnosis Date Noted  . Breast cancer ([place]) 11/26/2019  . Chemotherapy-induced neuropathy ([place]) 11/26/2019  . Deep vein thrombosis (DVT) of proximal vein of right lower extremity ([place]) 11/26/2019    Stark Bray 12/03/2019, 12:04 PM  [place], [place], [address] Phone: [phone]   Fax:  [phone]  Name: Aubry Farnes MRN: [id number] Date of Birth: 13-Sep-1966

## 2019-12-03 NOTE — Patient Instructions (Signed)
Access Code: [email or web address]  URL: [email or web address]  Date: 12/03/2019  Prepared by: Shan Levans   Exercises  Seated Long Arc Quad - 10 reps - 1 sets - 5 second hold - 1x daily - 7x weekly  Seated Heel Raise - 10 reps - 2x daily - 7x weekly  Seated Ankle Dorsiflexion AROM - 10 reps - 2x daily - 7x weekly  Seated Hip Adduction Isometrics with Ball - 10 reps - 5 second hold - 2x daily - 7x weekly  Seated March - 10 reps - 2x daily - 7x weekly

## 2019-12-10 ENCOUNTER — Ambulatory Visit: Payer: Medicaid Other | Admitting: Rehabilitation

## 2019-12-10 ENCOUNTER — Other Ambulatory Visit: Payer: Self-pay

## 2019-12-10 ENCOUNTER — Encounter: Payer: Self-pay | Admitting: Rehabilitation

## 2019-12-10 DIAGNOSIS — M6281 Muscle weakness (generalized): Secondary | ICD-10-CM | POA: Diagnosis not present

## 2019-12-10 DIAGNOSIS — T451X5A Adverse effect of antineoplastic and immunosuppressive drugs, initial encounter: Secondary | ICD-10-CM

## 2019-12-10 DIAGNOSIS — R296 Repeated falls: Secondary | ICD-10-CM

## 2019-12-10 DIAGNOSIS — G620 Drug-induced polyneuropathy: Secondary | ICD-10-CM

## 2019-12-10 NOTE — Therapy (Signed)
[place], [place], [address] Phone: [phone]   Fax:  [phone]  Physical Therapy Treatment  Patient Details  Name: Josiane Reifer MRN: [id number] Date of Birth: 09/13/1966 Referring Provider (PT): Dr. Maylon Peppers   Encounter Date: 12/10/2019  PT End of Session - 12/10/19 2146    Visit Number  2    Number of Visits  13    Date for PT Re-Evaluation  01/14/20    Authorization Type  Medicaid    Authorization - Visit Number  1    Authorization - Number of Visits  3   unitl 01/06/20   PT Start Time  0805    PT Stop Time  0843    PT Time Calculation (min)  38 min    Activity Tolerance  Patient tolerated treatment well    Behavior During Therapy  [facility] for tasks assessed/performed       History reviewed. No pertinent past medical history.  History reviewed. No pertinent surgical history.  There were no vitals filed for this visit.  Subjective Assessment - 12/10/19 2137    Subjective  My knees are hurting from walking this weekend.  I did alot of walking but with a buggy.  I have a walker ordered    Pertinent History  DM, bipolar disorder, high cholesterol, asthma, history of blood clots, breast cancer treatment completed lumpectomy on the left with SLNB 0/1 nodes removed.  Chemo completed except for 3 sessions and radiation completed all in [place].    Patient Stated Goals  improve walking and strength    Currently in Pain?  Yes    Pain Score  8     Pain Location  Knee    Pain Orientation  Right;Left    Pain Descriptors / Indicators  Aching    Pain Type  Chronic pain    Pain Onset  In the past 7 days    Pain Frequency  Intermittent    Aggravating Factors   walking weather    Pain Relieving Factors  rest, heat         [facility] PT Assessment - 12/10/19 0001      6 Minute Walk- Baseline   6 Minute Walk- Baseline  yes    Modified Borg Scale for Dyspnea  0- Nothing at all    Perceived Rate of  Exertion (Borg)  13- Somewhat hard      6 minute walk test results    Aerobic Endurance Distance Walked  456    Endurance additional comments  able to walk 74min15sec due to knees starting to feel weak                   [facility] Adult PT Treatment/Exercise - 12/10/19 0001      Ambulation/Gait   Gait Comments  discussed walking with increased TKE during stance phase to help with feelings of knees going to buckle      High Level Balance   High Level Balance Comments  standing at treadmill blue foam stance without hands 3x30" and feet together 3x20" SBA, at treadmill unstable grid foam step ups x 3 bil and side steps across the two squares Rt/Lt x 3      Exercises   Exercises  Knee/Hip      Knee/Hip Exercises: Aerobic   Stationary Bike  level 1 x 9min with knee pain decreased per pt      Knee/Hip Exercises: Standing   Heel Raises  5 reps  Heel Raises Limitations  at treadmill    Hip Flexion  5 reps    Hip Flexion Limitations  at treadmill    Hip Abduction  5 reps    Abduction Limitations  at treadmill    Hip Extension  5 reps    Extension Limitations  at treadmill    Functional Squat  5 reps    Functional Squat Limitations  at treadmill with vcs to increase hips over the chair                  PT Long Term Goals - 12/10/19 2150      PT LONG TERM GOAL #5   Title  Pt will tolerate a full 6min of walking during 25min walk test in clinic and without reports of knees feeling like buckling    Time  5    Period  Weeks    Status  New            Plan - 12/10/19 2147    Clinical Impression Statement  Pt returns for first treatment post eval.  Started LE endurance and balance which pt tolerated well with no LOB or knees giving away which pt is fearful of during gait and unsupported stance.  Pt arrived with knee pain that was decreased with TE today.  24min walk test performed today only able to get to 3min 15 seconds due to knees feeling weak but pt happy with  this distance and time due to never walking unsupported.    PT Frequency  2x / week    PT Duration  6 weeks    PT Treatment/Interventions  ADLs/Self Care Home Management;Therapeutic exercise;Patient/family education;Neuromuscular re-education;Gait training;DME Instruction;Balance training    PT Next Visit Plan  cont bike and strengthening exercises for quads and LEs,, balance and gait work using care for falls    PT Home Exercise Plan  Access Code: [email or web address]    Consulted and Agree with Plan of Care  Patient       Patient will benefit from skilled therapeutic intervention in order to improve the following deficits and impairments:  Abnormal gait, Pain, Decreased mobility, Decreased activity tolerance, Decreased strength, Decreased balance  Visit Diagnosis: Muscle weakness (generalized)  Repeated falls  Chemotherapy-induced neuropathy ([place])     Problem List Patient Active Problem List   Diagnosis Date Noted  . Breast cancer ([place]) 11/26/2019  . Chemotherapy-induced neuropathy ([place]) 11/26/2019  . Deep vein thrombosis (DVT) of proximal vein of right lower extremity ([place]) 11/26/2019    Stark Bray 12/10/2019, 9:52 PM  [place], [place], [address] Phone: [phone]   Fax:  [phone]  Name: Genara Turcotte MRN: [id number] Date of Birth: 1966/02/11

## 2019-12-11 ENCOUNTER — Other Ambulatory Visit: Payer: Self-pay | Admitting: *Deleted

## 2019-12-11 ENCOUNTER — Telehealth: Payer: Self-pay | Admitting: *Deleted

## 2019-12-11 ENCOUNTER — Other Ambulatory Visit: Payer: Self-pay | Admitting: Radiology

## 2019-12-11 NOTE — Telephone Encounter (Signed)
Tried returning patient's phone call regarding "a hard spot on her breast." Per Dr. Maylon Peppers, she needs to go to [place] and have a mammogram. He put the order in [place]. She can go to [facility], [place] [address], [address], [place], [place] [address]. She can call and arrange the appointment date and time. Phone number is [phone].

## 2019-12-13 ENCOUNTER — Other Ambulatory Visit (HOSPITAL_COMMUNITY): Payer: Medicaid Other

## 2019-12-13 ENCOUNTER — Ambulatory Visit (HOSPITAL_COMMUNITY): Payer: Medicaid Other

## 2019-12-14 ENCOUNTER — Telehealth: Payer: Self-pay | Admitting: *Deleted

## 2019-12-14 ENCOUNTER — Other Ambulatory Visit: Payer: Self-pay | Admitting: *Deleted

## 2019-12-14 DIAGNOSIS — C50212 Malignant neoplasm of upper-inner quadrant of left female breast: Secondary | ICD-10-CM

## 2019-12-14 DIAGNOSIS — Z171 Estrogen receptor negative status [ER-]: Secondary | ICD-10-CM

## 2019-12-14 NOTE — Telephone Encounter (Signed)
Patient called and stated,"I had cancer in my left breast. I've been in remission for 9 months. About four days ago, I noticed my left nipple is hard and is swollen. In fact, my whole breast is firm. I've had radiation and chemotherapy. There is no discharge from the nipple." Per Laverna Peace, she needs a mammogram on Monday at [facility]. Orders placed. Patient verbalized understanding.

## 2019-12-17 ENCOUNTER — Other Ambulatory Visit: Payer: Self-pay

## 2019-12-17 ENCOUNTER — Ambulatory Visit: Payer: Medicaid Other

## 2019-12-17 DIAGNOSIS — M6281 Muscle weakness (generalized): Secondary | ICD-10-CM

## 2019-12-17 DIAGNOSIS — R296 Repeated falls: Secondary | ICD-10-CM

## 2019-12-17 DIAGNOSIS — G620 Drug-induced polyneuropathy: Secondary | ICD-10-CM

## 2019-12-17 DIAGNOSIS — T451X5A Adverse effect of antineoplastic and immunosuppressive drugs, initial encounter: Secondary | ICD-10-CM

## 2019-12-17 NOTE — Patient Instructions (Addendum)
Hamstring Stretch    Sit in chair. Inhale and straighten spine. Exhale and lean forward toward extended leg holding for 20-30 sec. Repeat with other leg extended. Repeat _3__ times, alternating legs. Do _2__ times per day.   Piriformis Stretch, Sitting    Sit, one ankle on opposite knee, same-side hand on crossed knee. Push down on knee, keeping spine straight. Lean torso forward, with flat back, until tension is felt in hamstrings and gluteals of crossed-leg side. Hold __20-30_ seconds.  Repeat _3__ times per session. Do __2_ sessions per day.  Bridging (Buttocks / Hamstring Stretch)    Lie on back, knees bent, feet flat on surface. Breathe in. Raise buttocks and hold position _5__ seconds, breathing out through pursed lips. Return to start position, breathing in. Remember: do not hold your breath. Repeat _10__ times. Do _1-2__ sessions per day.  Short Arc [employer] a large can or rolled towel under left leg. Straighten leg. Hold __5__ seconds. Repeat __10__ times. Do __1-2__ sessions per day.  Straight Leg Raise    Tighten stomach and slowly raise locked right leg __6-10__ inches from floor. Repeat __5-10__ times per set. Do __1-2__ sets per session. Do __1-2__ sessions per day.    3 Way Raises:      Starting Position: FOR NOW SEATED IN CHAIR (Leaning against wall, walk feet a few inches away from the wall and make tummy tight (tuck hips underneath you) Press back/shoulders/head against wall as much as possible.) Keep thumbs up to ceiling, elbows straight and shoulders relaxed/down throughout.  1. Lift arms in front to shoulder height 2. Lift arms a little wider into a "V" to shoulder height 3. Lift arms out to sides in a "T" to shoulder height  Perform 10 times in each direction. Hold 1-2 lbs to start with and work up to 2-3 sets of 10/day. Perform 3-4 times/week. Increase weight as able, decreasing sets of 10 each time you increase weights, then slowly  working your way back up to 2-3 sets each time.    Cancer Rehab [phone]

## 2019-12-17 NOTE — Therapy (Signed)
[place], [place], [address] Phone: [phone]   Fax:  [phone]  Physical Therapy Treatment  Patient Details  Name: Ariana Herrera MRN: [id number] Date of Birth: 12/09/1965 Referring Provider (PT): Dr. Maylon Peppers   Encounter Date: 12/17/2019  PT End of Session - 12/17/19 1421    Visit Number  3    Number of Visits  13    Date for PT Re-Evaluation  01/14/20    Authorization Type  Medicaid    Authorization - Visit Number  2    Authorization - Number of Visits  3   until 01/06/20   PT Start Time  [id number]   pt arrived early and PTA had a cancellation so able to start early   PT Stop Time  1418    PT Time Calculation (min)  65 min    Activity Tolerance  Patient tolerated treatment well    Behavior During Therapy  [facility] for tasks assessed/performed       History reviewed. No pertinent past medical history.  History reviewed. No pertinent surgical history.  There were no vitals filed for this visit.  Subjective Assessment - 12/17/19 1316    Subjective  My knees bothered all weekend after I fell down my 3 steps at home Saturday. So both knees are really hurting today. I was going down the stairs and that's when it normally happens.    Pertinent History  DM, bipolar disorder, high cholesterol, asthma, history of blood clots, breast cancer treatment completed lumpectomy on the left with SLNB 0/1 nodes removed.  Chemo completed except for 3 sessions and radiation completed all in [place].    Patient Stated Goals  improve walking and strength    Currently in Pain?  Yes    Pain Score  8    Rt 8, Lt 5   Pain Location  Knee    Pain Orientation  Right;Left   right is worse   Pain Descriptors / Indicators  Sharp   feels like bone rubbing together   Pain Type  Chronic pain    Pain Onset  In the past 7 days    Pain Frequency  Intermittent    Aggravating Factors   fell on Saturday, prolonged walking, weather     Pain Relieving Factors  rest heat                       [facility] Adult PT Treatment/Exercise - 12/17/19 0001      Knee/Hip Exercises: Stretches   Piriformis Stretch  Right;Left;1 rep;20 seconds    Piriformis Stretch Limitations  seated edge of mat    Gastroc Stretch  Right;Left;2 reps;10 seconds   then seated with strap, much easier for pt   Gastroc Stretch Limitations  at counter supporting self on forearms, but still had LOB from Rt knee giving out on her      Knee/Hip Exercises: Aerobic   Nustep  Level 2, x10 mins which pt tolerated very well reporting pain almost gone after      Knee/Hip Exercises: Standing   Heel Raises  Both;10 reps    Heel Raises Limitations  at counter      Knee/Hip Exercises: Seated   Long Arc Quad  AROM;Strengthening;Right;Left;Other (comment)   7 reps   Long Arc Quad Limitations  pain in Lt (calf tightness), no pain with Rt, just quad quiver             PT Education -  12/17/19 1412    Education Details  Supine bil LE strength, and seated bil UE strength    Person(s) Educated  Patient    Methods  Explanation;Demonstration;Handout    Comprehension  Verbalized understanding;Returned demonstration;Need further instruction          PT Long Term Goals - 12/10/19 2150      PT LONG TERM GOAL #5   Title  Pt will tolerate a full 46min of walking during 45min walk test in clinic and without reports of knees feeling like buckling    Time  5    Period  Weeks    Status  New             Plan - 12/17/19 1422    Clinical Impression Statement  Started session with pt on NuStep which she tolerated very well being able to go for 10 minutes, though did require low resistance at level 2 due to LE weakness. Was working on calf stretch with leaning forearms on counter in runners pose with bent knee supported by cabinet and during last rep with Rt LE posterior her knee gave out casuing Lt knee to give out and pt slid to floor. No injury was  incurred with pt just reporting some soreness at knees once sitting edge of bed (was able to get up off floor using therapist min assist and mat table to pull up). Pt was able to complete session with seated edge of bed LE stretches and supine LE exercises which pt tolerated well without any pain, just reported exercises being challenging due to LE weakness. At end of session pt did not have any increased knee pain and soreness gone as well, and she was able to ambulate to car, though therapist walked with her for safety. Encouraged pt to begin bringing her walker with her and using for community ambulation as she has no signs of when her knees give out. She verbalized understanding and agreed.    Personal Factors and Comorbidities  Fitness;Behavior Pattern;Comorbidity 2    Comorbidities  odd presentation of symptoms, chemo reaction    Examination-Activity Limitations  Stairs;Locomotion Level    Examination-Participation Restrictions  Yard Work;Cleaning;Meal Prep;Community Activity;Driving;Laundry;Shop    Stability/Clinical Decision Making  Evolving/Moderate complexity    Rehab Potential  Fair    PT Frequency  2x / week    PT Duration  6 weeks    PT Treatment/Interventions  ADLs/Self Care Home Management;Therapeutic exercise;Patient/family education;Neuromuscular re-education;Gait training;DME Instruction;Balance training    PT Next Visit Plan  cont bike or Nustep and strengthening exercises for quads and LEs, balance and gait work using care for falls. Gait train with walker if pt brings.    PT Home Exercise Plan  Access Code: [email or web address]; seated HS and piriformis stretches, and supine LE strength    Consulted and Agree with Plan of Care  Patient       Patient will benefit from skilled therapeutic intervention in order to improve the following deficits and impairments:  Abnormal gait, Pain, Decreased mobility, Decreased activity tolerance, Decreased strength, Decreased balance  Visit  Diagnosis: Muscle weakness (generalized)  Repeated falls  Chemotherapy-induced neuropathy ([place])     Problem List Patient Active Problem List   Diagnosis Date Noted  . Breast cancer ([place]) 11/26/2019  . Chemotherapy-induced neuropathy ([place]) 11/26/2019  . Deep vein thrombosis (DVT) of proximal vein of right lower extremity ([place]) 11/26/2019    Otelia Limes, PTA 12/17/2019, 2:59 PM  [place]  [place], [place], [address] Phone: [phone]   Fax:  [phone]  Name: Ariana Herrera MRN: [id number] Date of Birth: [birth year]

## 2019-12-19 ENCOUNTER — Other Ambulatory Visit: Payer: Self-pay | Admitting: Radiology

## 2019-12-20 ENCOUNTER — Encounter (HOSPITAL_COMMUNITY): Payer: Self-pay

## 2019-12-20 ENCOUNTER — Other Ambulatory Visit: Payer: Self-pay

## 2019-12-20 ENCOUNTER — Ambulatory Visit (HOSPITAL_COMMUNITY)
Admission: RE | Admit: 2019-12-20 | Discharge: 2019-12-20 | Disposition: A | Discharge status: Home / Self Care | Payer: Medicaid Other | Source: Ambulatory Visit | Attending: Hematology | Admitting: Hematology

## 2019-12-20 DIAGNOSIS — Z923 Personal history of irradiation: Secondary | ICD-10-CM | POA: Insufficient documentation

## 2019-12-20 DIAGNOSIS — F17210 Nicotine dependence, cigarettes, uncomplicated: Secondary | ICD-10-CM | POA: Insufficient documentation

## 2019-12-20 DIAGNOSIS — Z7984 Long term (current) use of oral hypoglycemic drugs: Secondary | ICD-10-CM | POA: Insufficient documentation

## 2019-12-20 DIAGNOSIS — Z853 Personal history of malignant neoplasm of breast: Secondary | ICD-10-CM | POA: Diagnosis not present

## 2019-12-20 DIAGNOSIS — Z86718 Personal history of other venous thrombosis and embolism: Secondary | ICD-10-CM | POA: Diagnosis not present

## 2019-12-20 DIAGNOSIS — Z79899 Other long term (current) drug therapy: Secondary | ICD-10-CM | POA: Insufficient documentation

## 2019-12-20 DIAGNOSIS — Z880 Allergy status to penicillin: Secondary | ICD-10-CM | POA: Diagnosis not present

## 2019-12-20 DIAGNOSIS — Z452 Encounter for adjustment and management of vascular access device: Secondary | ICD-10-CM | POA: Diagnosis not present

## 2019-12-20 DIAGNOSIS — Z9221 Personal history of antineoplastic chemotherapy: Secondary | ICD-10-CM | POA: Insufficient documentation

## 2019-12-20 DIAGNOSIS — Z171 Estrogen receptor negative status [ER-]: Secondary | ICD-10-CM

## 2019-12-20 DIAGNOSIS — C50912 Malignant neoplasm of unspecified site of left female breast: Secondary | ICD-10-CM

## 2019-12-20 DIAGNOSIS — Z7901 Long term (current) use of anticoagulants: Secondary | ICD-10-CM | POA: Insufficient documentation

## 2019-12-20 HISTORY — PX: IR REMOVAL TUN ACCESS W/ PORT W/O FL MOD SED: IMG2290

## 2019-12-20 LAB — CBC WITH DIFFERENTIAL/PLATELET
Abs Immature Granulocytes: 0.02 10*3/uL (ref 0.00–0.07)
Basophils Absolute: 0 10*3/uL (ref 0.0–0.1)
Basophils Relative: 0 %
Eosinophils Absolute: 0.1 10*3/uL (ref 0.0–0.5)
Eosinophils Relative: 1 %
HCT: 38.7 % (ref 36.0–46.0)
Hemoglobin: 13.2 g/dL (ref 12.0–15.0)
Immature Granulocytes: 0 %
Lymphocytes Relative: 27 %
Lymphs Abs: 1.9 10*3/uL (ref 0.7–4.0)
MCH: 32.1 pg (ref 26.0–34.0)
MCHC: 34.1 g/dL (ref 30.0–36.0)
MCV: 94.2 fL (ref 80.0–100.0)
Monocytes Absolute: 0.6 10*3/uL (ref 0.1–1.0)
Monocytes Relative: 9 %
Neutro Abs: 4.3 10*3/uL (ref 1.7–7.7)
Neutrophils Relative %: 63 %
Platelets: 386 10*3/uL (ref 150–400)
RBC: 4.11 MIL/uL (ref 3.87–5.11)
RDW: 13.5 % (ref 11.5–15.5)
WBC: 6.9 10*3/uL (ref 4.0–10.5)
nRBC: 0 % (ref 0.0–0.2)

## 2019-12-20 LAB — PROTIME-INR
INR: 1 (ref 0.8–1.2)
Prothrombin Time: 13.5 seconds (ref 11.4–15.2)

## 2019-12-20 MED ORDER — SODIUM CHLORIDE 0.9 % IV SOLN: INTRAVENOUS | Status: DC

## 2019-12-20 MED ORDER — HEPARIN SOD (PORK) LOCK FLUSH 100 UNIT/ML IV SOLN
INTRAVENOUS | Status: AC
  Filled 2019-12-20: qty 5

## 2019-12-20 MED ORDER — LIDOCAINE HCL 1 % IJ SOLN
INTRAMUSCULAR | Status: AC
  Filled 2019-12-20: qty 20

## 2019-12-20 MED ORDER — MIDAZOLAM HCL 2 MG/2ML IJ SOLN
INTRAMUSCULAR | Status: AC | PRN
  Administered 2019-12-20 (×3): 1 mg via INTRAVENOUS

## 2019-12-20 MED ORDER — FENTANYL CITRATE (PF) 100 MCG/2ML IJ SOLN
INTRAMUSCULAR | Status: AC | PRN
  Administered 2019-12-20 (×2): 50 ug via INTRAVENOUS

## 2019-12-20 MED ORDER — MIDAZOLAM HCL 2 MG/2ML IJ SOLN
INTRAMUSCULAR | Status: AC
  Filled 2019-12-20: qty 4

## 2019-12-20 MED ORDER — LIDOCAINE HCL (PF) 1 % IJ SOLN
INTRAMUSCULAR | Status: AC | PRN
  Administered 2019-12-20: 10 mL via INTRADERMAL

## 2019-12-20 MED ORDER — FENTANYL CITRATE (PF) 100 MCG/2ML IJ SOLN
INTRAMUSCULAR | Status: AC
  Filled 2019-12-20: qty 2

## 2019-12-20 MED ORDER — CLINDAMYCIN PHOSPHATE 900 MG/50ML IV SOLN: 900.0000 mg | Freq: Once | INTRAVENOUS | Status: AC

## 2019-12-20 MED ORDER — CLINDAMYCIN PHOSPHATE 900 MG/50ML IV SOLN
INTRAVENOUS | Status: AC
  Administered 2019-12-20: 900 mg via INTRAVENOUS
  Filled 2019-12-20: qty 50

## 2019-12-20 NOTE — Consult Note (Signed)
Chief Complaint: Patient was seen in consultation today for Port-A-Cath removal  Referring Physician(s): [place]  Supervising Physician: Aletta Edouard  Patient Status: Liberal  History of Present Illness: Ariana Herrera is a 54 y.o. female smoker with past medical history of left breast carcinoma in 2019 with prior Port-A-Cath placement as well as lumpectomy and chemoradiation in [place].  She also has a history of right lower extremity DVT in 2019, on Eliquis, but with recent Doppler showing no further evidence of DVT.  She currently has no evidence of disease recurrence and presents today for Port-A-Cath removal.    History reviewed. No pertinent past medical history.  History reviewed. No pertinent surgical history.  Allergies: Lithium and Penicillins  Medications: Prior to Admission medications   Medication Sig Start Date End Date Taking? Authorizing Provider  cyclobenzaprine (FLEXERIL) 10 MG tablet Take 10 mg by mouth 2 (two) times daily. 11/26/19  Yes [provider]  gabapentin (NEURONTIN) 800 MG tablet Take 800 mg by mouth 3 (three) times daily. 11/26/19  Yes [provider]  glipiZIDE (GLUCOTROL) 10 MG tablet Take 10 mg by mouth 2 (two) times daily before a meal. 11/26/19  Yes [provider]  HYDROcodone-acetaminophen (NORCO/VICODIN) 5-325 MG tablet Take 1 tablet by mouth 2 (two) times daily. 11/26/19  Yes [provider]  ibuprofen (ADVIL) 400 MG tablet Take 400 mg by mouth 3 (three) times daily. 11/26/19  Yes [provider]  metFORMIN (GLUCOPHAGE) 1000 MG tablet Take 1,000 mg by mouth 2 (two) times daily with a meal. 11/26/19  Yes [provider]  omeprazole (PRILOSEC) 40 MG capsule Take 40 mg by mouth daily. 11/26/19  Yes [provider]  apixaban (ELIQUIS) 5 MG TABS tablet Take 5 mg by mouth 2 (two) times daily. 11/26/19   [provider]     History reviewed. No pertinent family  history.  Social History   Socioeconomic History  . Marital status: Unknown    Spouse name: Not on file  . Number of children: Not on file  . Years of education: Not on file  . Highest education level: Not on file  Occupational History  . Not on file  Tobacco Use  . Smoking status: Current Every Day Smoker    Packs/day: 2.00    Types: Cigarettes    Start date: 11/26/2019  . Smokeless tobacco: Never Used  Substance and Sexual Activity  . Alcohol use: Not on file  . Drug use: Not on file  . Sexual activity: Not on file  Other Topics Concern  . Not on file  Social History Narrative  . Not on file   Social Determinants of Health   Financial Resource Strain:   . Difficulty of Paying Living Expenses: Not on file  Food Insecurity:   . Worried About Charity fundraiser in the Last Year: Not on file  . Ran Out of Food in the Last Year: Not on file  Transportation Needs:   . Lack of Transportation (Medical): Not on file  . Lack of Transportation (Non-Medical): Not on file  Physical Activity:   . Days of Exercise per Week: Not on file  . Minutes of Exercise per Session: Not on file  Stress:   . Feeling of Stress : Not on file  Social Connections:   . Frequency of Communication with Friends and Family: Not on file  . Frequency of Social Gatherings with Friends and Family: Not on file  . Attends Religious Services: Not  on file  . Active Member of Clubs or Organizations: Not on file  . Attends Archivist Meetings: Not on file  . Marital Status: Not on file      Review of Systems She denies fever, headache, chest pain, dyspnea, cough, abdominal/back pain, nausea, vomiting or bleeding.  Vital Signs: BP 130/82   Pulse 99   Temp 98.6 F (37 C) (Oral)   Resp 16   SpO2 96%   Physical Exam awake, alert.  Chest clear to auscultation bilaterally.  Clean, intact right chest wall Port-A-Cath.  Heart with regular rate and rhythm.  Abdomen soft, positive bowel sounds,  nontender.  No lower extremity edema.  Imaging: VAS [place] LOWER EXTREMITY VENOUS (DVT)  Result Date: 11/29/2019  Lower Venous DVTStudy Indications: Rule out recurrent dvt.  Performing Technologist: Cardell Peach RDCS, RVT  Examination Guidelines: A complete evaluation includes B-mode imaging, spectral Doppler, color Doppler, and power Doppler as needed of all accessible portions of each vessel. Bilateral testing is considered an integral part of a complete examination. Limited examinations for reoccurring indications may be performed as noted. The reflux portion of the exam is performed with the patient in reverse Trendelenburg.  +---------+---------------+---------+-----------+----------+--------------+ RIGHT    CompressibilityPhasicitySpontaneityPropertiesThrombus Aging +---------+---------------+---------+-----------+----------+--------------+ CFV      Full           Yes      Yes                                 +---------+---------------+---------+-----------+----------+--------------+ SFJ      Full                                                        +---------+---------------+---------+-----------+----------+--------------+ FV Prox  Full           Yes      Yes                                 +---------+---------------+---------+-----------+----------+--------------+ FV Mid   Full                                                        +---------+---------------+---------+-----------+----------+--------------+ FV DistalFull           Yes      Yes                                 +---------+---------------+---------+-----------+----------+--------------+ PFV      Full                                                        +---------+---------------+---------+-----------+----------+--------------+ POP      Full           Yes      Yes                                 +---------+---------------+---------+-----------+----------+--------------+  PTV      Full                                                         +---------+---------------+---------+-----------+----------+--------------+ PERO     Full                                                        +---------+---------------+---------+-----------+----------+--------------+ Gastroc  Full                                                        +---------+---------------+---------+-----------+----------+--------------+ GSV      Full                                                        +---------+---------------+---------+-----------+----------+--------------+ SSV      Full                                                        +---------+---------------+---------+-----------+----------+--------------+   +---------+---------------+---------+-----------+----------+--------------+ LEFT     CompressibilityPhasicitySpontaneityPropertiesThrombus Aging +---------+---------------+---------+-----------+----------+--------------+ CFV      Full           Yes      Yes                                 +---------+---------------+---------+-----------+----------+--------------+ SFJ      Full                                                        +---------+---------------+---------+-----------+----------+--------------+ FV Prox  Full           Yes      Yes                                 +---------+---------------+---------+-----------+----------+--------------+ FV Mid   Full                                                        +---------+---------------+---------+-----------+----------+--------------+ FV DistalFull           Yes      Yes                                 +---------+---------------+---------+-----------+----------+--------------+  PFV      Full                                                        +---------+---------------+---------+-----------+----------+--------------+ POP      Full           Yes      Yes                                  +---------+---------------+---------+-----------+----------+--------------+ PTV      Full                                                        +---------+---------------+---------+-----------+----------+--------------+ PERO     Full                                                        +---------+---------------+---------+-----------+----------+--------------+ Gastroc  Full                                                        +---------+---------------+---------+-----------+----------+--------------+ GSV      Full                                                        +---------+---------------+---------+-----------+----------+--------------+ SSV      Full                                                        +---------+---------------+---------+-----------+----------+--------------+   No previous study for comparison   Summary: BILATERAL: - No evidence of deep vein thrombosis seen in the lower extremities, bilaterally. - No evidence of superficial venous thrombosis in the lower extremities, bilaterally.  RIGHT: - There is no evidence of deep vein thrombosis in the lower extremity. - There is no evidence of superficial venous thrombosis.  - No cystic structure found in the popliteal fossa.  LEFT: - There is no evidence of superficial venous thrombosis. - There is no evidence of deep vein thrombosis in the lower extremity.  - No cystic structure found in the popliteal fossa.  *See table(s) above for measurements and observations. Electronically signed by Shirlee More MD on 11/29/2019 at 5:34:10 PM.    Final     Labs:  CBC: Recent Labs    11/26/19 0823  WBC 7.2  HGB 14.1  HCT 40.6  PLT 313    COAGS: No results for input(s): INR, APTT in the last 8760 hours.  BMP: Recent Labs    11/26/19 0823  NA 138  K 4.5  CL 104  CO2 25  GLUCOSE 175*  BUN 8  CALCIUM 9.4  CREATININE 0.72  GFRNONAA >60  GFRAA >60    LIVER FUNCTION TESTS: Recent Labs     11/26/19 0823  BILITOT 0.3  AST 12*  ALT 13  ALKPHOS 123  PROT 6.8  ALBUMIN 4.0    TUMOR MARKERS: No results for input(s): AFPTM, CEA, CA199, CHROMGRNA in the last 8760 hours.  Assessment and Plan:  54 y.o. female smoker with past medical history of left breast carcinoma in 2019 with prior Port-A-Cath placement as well as lumpectomy and chemoradiation in [place].  She also has a history of right lower extremity DVT in 2019, on Eliquis, but with recent Doppler showing no further evidence of DVT.  She currently has no evidence of disease recurrence and presents today for Port-A-Cath removal.  She has been off her Eliquis for allotted amount of time in preparation for port removal.  Details/risks of procedure, including but not limited to, internal bleeding, infection, injury to adjacent structures discussed with patient with her understanding and consent.   Thank you for this interesting consult.  I greatly enjoyed meeting Ariana Herrera and look forward to participating in their care.  A copy of this report was sent to the requesting provider on this date.  Electronically Signed: D. Rowe Robert, PA-C 12/20/2019, 1:21 PM   I spent a total of 20 minutes  in face to face in clinical consultation, greater than 50% of which was counseling/coordinating care for Port-A-Cath removal

## 2019-12-20 NOTE — Sedation Documentation (Signed)
Finger pulse oximeter not registering oxygen level adequately due to patient's nail polish. Ear pulse oximeter applied.

## 2019-12-20 NOTE — Procedures (Signed)
Interventional Radiology Procedure Note  Procedure: Port removal  Complications: None  Estimated Blood Loss: < 10 mL  Findings: Right chest port removed in entirety utilizing sharp and blunt dissection.  Venetia Night. Kathlene Cote, M.D Pager:  [phone]

## 2019-12-20 NOTE — Discharge Instructions (Signed)
Implanted Port Removal  Implanted port removal is a procedure to remove the port and catheter that are implanted under your skin. The port is a small disc under your skin that can be punctured with a needle. It is connected to a vein in your chest or neck by a small flexible tube (catheter). The implanted port is used to give medicines for treatments, and it may also be used to take blood samples. Your health care provider will remove the implanted port if:  You no longer need it for treatment.  It is not working properly.  The area around it gets infected. Tell a health care provider about:  Any allergies you have.  All medicines you are taking, including vitamins, herbs, eye drops, creams, and over-the-counter medicines.  Any problems you or family members have had with anesthetic medicines.  Any blood disorders you have.  Any surgeries you have had.  Any medical conditions you have.  Whether you are pregnant or may be pregnant. What are the risks? Generally, this is a safe procedure. However, problems may occur, including:  Infection.  Bleeding.  Allergic reactions to anesthetic medicines.  Damage to nerves or blood vessels. What happens before the procedure? Medicines  Ask your health care provider about: ? Changing or stopping your regular medicines. This is especially important if you are taking diabetes medicines or blood thinners. ? Taking medicines such as aspirin and ibuprofen. These medicines can thin your blood. Do not take these medicines unless your health care provider tells you to take them. ? Taking over-the-counter medicines, vitamins, herbs, and supplements. General instructions  You will have: ? A physical exam. ? Blood tests. ? Imaging tests, including a chest X-ray.  Follow instructions from your health care provider about eating or drinking restrictions.  Ask your health care provider how your surgical site will be marked or identified.  Ask  your health care provider what steps will be taken to help prevent infection. These may include: ? Removing hair at the surgery site. ? Washing skin with a germ-killing soap. ? Antibiotic medicine.  Plan to have someone take you home from the hospital or clinic.  If you will be going home right after the procedure, plan to have a responsible adult care for you for at least 24 hours after you leave the hospital or clinic. This is important. What happens during the procedure?  You may be given one or more of the following: ? A medicine to help you relax (sedative). ? A medicine to numb the area (local anesthetic).  A small incision will be made at the site of your implanted port.  The implanted port and the catheter that has been inside your vein will be gently removed.  The port and catheter will be inspected to make sure that all the parts have been removed. Part of the catheter may be tested for bacteria.  The incision will be closed with stitches (sutures), adhesive strips, or skin glue.  A bandage (dressing) will be placed over the incision. The health care provider may apply gentle pressure over the dressing for about 5 minutes. The procedure may vary among health care providers and hospitals. What happens after the procedure?  Your blood pressure, heart rate, breathing rate, and blood oxygen level will be monitored until you leave the hospital or clinic.  You will be monitored to make sure that there is no bleeding from the site where the port was removed.  Do not drive for 24 hours  if you were given a sedative during your procedure. Summary  Implanted port removal is a procedure to remove the port and catheter that are implanted under your skin.  Before the procedure, follow your health care provider's instructions about changing or stopping your regular medicines. This is especially important if you are taking diabetes medicines or blood thinners.  If you will be going  home right after the procedure, plan to have a responsible adult care for you for at least 24 hours after you leave the hospital or clinic. This information is not intended to replace advice given to you by your health care provider. Make sure you discuss any questions you have with your health care provider. Document Revised: 11/24/2017 Document Reviewed: 11/24/2017 Elsevier Patient Education  [employer]. Moderate Conscious Sedation, Adult, Care After These instructions provide you with information about caring for yourself after your procedure. Your health care provider may also give you more specific instructions. Your treatment has been planned according to current medical practices, but problems sometimes occur. Call your health care provider if you have any problems or questions after your procedure. What can I expect after the procedure? After your procedure, it is common:  To feel sleepy for several hours.  To feel clumsy and have poor balance for several hours.  To have poor judgment for several hours.  To vomit if you eat too soon. Follow these instructions at home: For at least 24 hours after the procedure:   Do not: ? Participate in activities where you could fall or become injured. ? Drive. ? Use heavy machinery. ? Drink alcohol. ? Take sleeping pills or medicines that cause drowsiness. ? Make important decisions or sign legal documents. ? Take care of children on your own.  Rest. Eating and drinking  Follow the diet recommended by your health care provider.  If you vomit: ? Drink water, juice, or soup when you can drink without vomiting. ? Make sure you have little or no nausea before eating solid foods. General instructions  Have a responsible adult stay with you until you are awake and alert.  Take over-the-counter and prescription medicines only as told by your health care provider.  If you smoke, do not smoke without supervision.  Keep all  follow-up visits as told by your health care provider. This is important. Contact a health care provider if:  You keep feeling nauseous or you keep vomiting.  You feel light-headed.  You develop a rash.  You have a fever. Get help right away if:  You have trouble breathing. This information is not intended to replace advice given to you by your health care provider. Make sure you discuss any questions you have with your health care provider. Document Revised: 09/23/2017 Document Reviewed: 01/31/2016 Elsevier Patient Education  2020 [employer].

## 2019-12-24 ENCOUNTER — Other Ambulatory Visit: Payer: Self-pay

## 2019-12-24 ENCOUNTER — Ambulatory Visit: Payer: Medicaid Other | Attending: Hematology | Admitting: Rehabilitation

## 2019-12-24 ENCOUNTER — Encounter: Payer: Self-pay | Admitting: Rehabilitation

## 2019-12-24 DIAGNOSIS — M6281 Muscle weakness (generalized): Secondary | ICD-10-CM | POA: Diagnosis present

## 2019-12-24 DIAGNOSIS — T451X5A Adverse effect of antineoplastic and immunosuppressive drugs, initial encounter: Secondary | ICD-10-CM | POA: Insufficient documentation

## 2019-12-24 DIAGNOSIS — R296 Repeated falls: Secondary | ICD-10-CM | POA: Insufficient documentation

## 2019-12-24 DIAGNOSIS — G620 Drug-induced polyneuropathy: Secondary | ICD-10-CM | POA: Insufficient documentation

## 2019-12-24 NOTE — Therapy (Signed)
[facility] [place], [place], [address] Phone: [phone]   Fax:  [phone]  Physical Therapy Treatment  Patient Details  Name: Ariana Herrera MRN: [id number] Date of Birth: 20-Mar-1966 Referring Provider (PT): Dr. Maylon Peppers   Encounter Date: 12/24/2019  PT End of Session - 12/24/19 1926    Visit Number  4    Number of Visits  13    Date for PT Re-Evaluation  01/14/20    Authorization Type  Medicaid    Authorization - Visit Number  3    Authorization - Number of Visits  3    PT Start Time  8309    PT Stop Time  1153    PT Time Calculation (min)  45 min    Activity Tolerance  Patient tolerated treatment well    Behavior During Therapy  [facility] for tasks assessed/performed       History reviewed. No pertinent past medical history.  Past Surgical History:  Procedure Laterality Date  . IR REMOVAL TUN ACCESS W/ PORT W/O FL MOD SED  12/20/2019    There were no vitals filed for this visit.  Subjective Assessment - 12/24/19 1136    Subjective  I didn't fall at all since last time and I have my walker    Pertinent History  DM, bipolar disorder, high cholesterol, asthma, history of blood clots, breast cancer treatment completed lumpectomy on the left with SLNB 0/1 nodes removed.  Chemo completed except for 3 sessions and radiation completed all in [place].    Patient Stated Goals  improve walking and strength    Currently in Pain?  No/denies         [facility] PT Assessment - 12/24/19 0001      Strength   Overall Strength Comments  no more SLR lag and able to get it up to the height of the opposite knee      Berg Balance Test   Sit to Stand  Able to stand  independently using hands    Standing Unsupported  Able to stand 2 minutes with supervision    Sitting with Back Unsupported but Feet Supported on Floor or Stool  Able to sit safely and securely 2 minutes    Stand to Sit  Sits safely with minimal use of hands    Transfers  Able to transfer safely, definite need of hands    Standing Unsupported with Eyes Closed  Able to stand 10 seconds with supervision    Standing Unsupported with Feet Together  Able to place feet together independently and stand for 1 minute with supervision    From Standing, Reach Forward with Outstretched Arm  Can reach confidently >25 cm (10")    From Standing Position, Pick up Object from Floor  Able to pick up shoe, needs supervision    From Standing Position, Turn to Look Behind Over each Shoulder  Turn sideways only but maintains balance    Turn 360 Degrees  Needs close supervision or verbal cueing    Standing Unsupported, Alternately Place Feet on Step/Stool  Needs assistance to keep from falling or unable to try    Standing Unsupported, One Foot in Front  Loses balance while stepping or standing    Standing on One Leg  Able to lift leg independently and hold equal to or more than 3 seconds    Total Score  35  [facility] PT Treatment/Exercise - 12/24/19 0001      Ambulation/Gait   Gait Comments  pt arrives with standard walker without wheels discussed getting wheels or switching to a diifferent walker using her prescription; gait in the hallway x 54f with clinic RW      Balance   Balance Assessed  Yes    Balance comment  BERG retested today for goal assessment / visit request      Knee/Hip Exercises: Aerobic   Nustep  level 2 x 613m stopping due to fatigue in the quads                  PT Long Term Goals - 12/24/19 1931      PT LONG TERM GOAL #1   Title  Pt will improve BERG to at least 45/56    Baseline  26/56, 35/56 on 12/24/19    Time  6    Period  Weeks    Status  On-going      PT LONG TERM GOAL #2   Title  Pt will demonstrate SLR with no quad lag and shakiness bilaterally    Status  Achieved      PT LONG TERM GOAL #3   Title  Pt will perform stairs or step up/down in the clinic safely with no knee buckling     Status  On-going      PT LONG TERM GOAL #4   Title  pt will be ind with LRAD as needed for ambulation    Status  Partially Met      PT LONG TERM GOAL #5   Title  Pt will tolerate a full 45m45mof walking during 45mi74malk test in clinic and without reports of knees feeling like buckling    Status  Not Met            Plan - 12/24/19 1928    Clinical Impression Statement  Pt arrives with new standard walker but it would be preferrable to have pt get a RW so she will see if she can switch it or get wheels at the medical supply store.  Reassessed goals today with pt able increasing BERG balance score from 26/56 to 35/56 but still under the cut off for high risk for falls and with RW recommendations.  Pt is now able to perform a SLR without lag and without shakiness which is a big improvement since evaluation.  Pt's biggest issue continues to be falls with the knees giving away when going down stairs or with knee bends and gait.  Will request 2x per week for 4 weeks for new medicaid authorization today.    Personal Factors and Comorbidities  Fitness;Behavior Pattern;Comorbidity 2    PT Frequency  2x / week    PT Duration  6 weeks    PT Treatment/Interventions  ADLs/Self Care Home Management;Therapeutic exercise;Patient/family education;Neuromuscular re-education;Gait training;DME Instruction;Balance training    PT Next Visit Plan  cont bike or Nustep and strengthening exercises for quads and LEs, balance and gait work using care for falls. Gait train with walker if pt brings.    PT Home Exercise Plan  Access Code: [email or web address] HS and piriformis stretches, and supine LE strength    Consulted and Agree with Plan of Care  Patient       Patient will benefit from skilled therapeutic intervention in order to improve the following deficits and impairments:  Abnormal gait, Pain, Decreased mobility, Decreased activity tolerance, Decreased strength, Decreased balance  Visit Diagnosis: Muscle  weakness (generalized)  Repeated falls  Chemotherapy-induced neuropathy ([place])     Problem List Patient Active Problem List   Diagnosis Date Noted  . Breast cancer ([place]) 11/26/2019  . Chemotherapy-induced neuropathy ([place]) 11/26/2019  . Deep vein thrombosis (DVT) of proximal vein of right lower extremity ([place]) 11/26/2019    Stark Bray 12/24/2019, 7:34 PM  [facility] [place], [place], [address] Phone: [phone]   Fax:  [phone]  Name: Cheney Gosch MRN: [id number] Date of Birth: 1966-04-23

## 2019-12-25 ENCOUNTER — Other Ambulatory Visit: Payer: Self-pay | Admitting: Family

## 2019-12-25 DIAGNOSIS — Z171 Estrogen receptor negative status [ER-]: Secondary | ICD-10-CM

## 2019-12-25 DIAGNOSIS — C50212 Malignant neoplasm of upper-inner quadrant of left female breast: Secondary | ICD-10-CM

## 2020-01-02 ENCOUNTER — Ambulatory Visit: Payer: Medicaid Other | Admitting: Rehabilitation

## 2020-01-04 ENCOUNTER — Ambulatory Visit: Payer: Medicaid Other | Admitting: Rehabilitation

## 2020-01-08 ENCOUNTER — Other Ambulatory Visit: Payer: Self-pay | Admitting: Family

## 2020-01-08 ENCOUNTER — Other Ambulatory Visit: Payer: Self-pay

## 2020-01-08 ENCOUNTER — Ambulatory Visit
Admission: RE | Admit: 2020-01-08 | Discharge: 2020-01-08 | Disposition: A | Discharge status: Home / Self Care | Payer: Medicaid Other | Source: Ambulatory Visit | Attending: Family | Admitting: Family

## 2020-01-08 DIAGNOSIS — Z171 Estrogen receptor negative status [ER-]: Secondary | ICD-10-CM

## 2020-01-08 DIAGNOSIS — C50212 Malignant neoplasm of upper-inner quadrant of left female breast: Secondary | ICD-10-CM

## 2020-01-08 HISTORY — DX: Personal history of irradiation: Z92.3

## 2020-01-08 HISTORY — DX: Personal history of antineoplastic chemotherapy: Z92.21

## 2020-01-09 ENCOUNTER — Ambulatory Visit: Payer: Medicaid Other | Admitting: Rehabilitation

## 2020-01-09 ENCOUNTER — Other Ambulatory Visit: Payer: Self-pay | Admitting: Hematology

## 2020-01-09 DIAGNOSIS — C50212 Malignant neoplasm of upper-inner quadrant of left female breast: Secondary | ICD-10-CM

## 2020-01-09 DIAGNOSIS — R234 Changes in skin texture: Secondary | ICD-10-CM

## 2020-01-09 DIAGNOSIS — Z171 Estrogen receptor negative status [ER-]: Secondary | ICD-10-CM

## 2020-01-11 ENCOUNTER — Other Ambulatory Visit: Payer: Self-pay | Admitting: *Deleted

## 2020-01-11 ENCOUNTER — Ambulatory Visit: Payer: Medicaid Other | Admitting: Rehabilitation

## 2020-01-11 DIAGNOSIS — R234 Changes in skin texture: Secondary | ICD-10-CM

## 2020-01-14 ENCOUNTER — Other Ambulatory Visit: Payer: Self-pay | Admitting: Hematology

## 2020-01-15 ENCOUNTER — Encounter: Payer: Self-pay | Admitting: *Deleted

## 2020-01-15 ENCOUNTER — Other Ambulatory Visit: Payer: Self-pay | Admitting: Hematology

## 2020-01-15 DIAGNOSIS — C50212 Malignant neoplasm of upper-inner quadrant of left female breast: Secondary | ICD-10-CM

## 2020-01-15 DIAGNOSIS — Z171 Estrogen receptor negative status [ER-]: Secondary | ICD-10-CM

## 2020-01-15 DIAGNOSIS — N6459 Other signs and symptoms in breast: Secondary | ICD-10-CM

## 2020-01-15 NOTE — Progress Notes (Signed)
Scheduled MR Breast for patient. She denies any exposure to metal, claustrophobia or allergy to dye. Due to her diabetes she needs to have creatinine checked prior to MRI.   Patient is aware of her Lab and MRI appointment, including time, date and location. She will follow up with her after scan.

## 2020-01-16 ENCOUNTER — Ambulatory Visit: Payer: Medicaid Other | Admitting: Rehabilitation

## 2020-01-18 ENCOUNTER — Other Ambulatory Visit: Payer: Self-pay

## 2020-01-18 ENCOUNTER — Inpatient Hospital Stay: Payer: Medicaid Other | Attending: Hematology

## 2020-01-18 ENCOUNTER — Ambulatory Visit: Payer: Medicaid Other | Admitting: Rehabilitation

## 2020-01-18 DIAGNOSIS — Z171 Estrogen receptor negative status [ER-]: Secondary | ICD-10-CM | POA: Diagnosis not present

## 2020-01-18 DIAGNOSIS — C50912 Malignant neoplasm of unspecified site of left female breast: Secondary | ICD-10-CM | POA: Insufficient documentation

## 2020-01-18 DIAGNOSIS — C50212 Malignant neoplasm of upper-inner quadrant of left female breast: Secondary | ICD-10-CM

## 2020-01-18 LAB — CBC WITH DIFFERENTIAL (CANCER CENTER ONLY)
Abs Immature Granulocytes: 0.02 10*3/uL (ref 0.00–0.07)
Basophils Absolute: 0 10*3/uL (ref 0.0–0.1)
Basophils Relative: 1 %
Eosinophils Absolute: 0.1 10*3/uL (ref 0.0–0.5)
Eosinophils Relative: 2 %
HCT: 37.8 % (ref 36.0–46.0)
Hemoglobin: 12.6 g/dL (ref 12.0–15.0)
Immature Granulocytes: 0 %
Lymphocytes Relative: 27 %
Lymphs Abs: 1.6 10*3/uL (ref 0.7–4.0)
MCH: 31 pg (ref 26.0–34.0)
MCHC: 33.3 g/dL (ref 30.0–36.0)
MCV: 93.1 fL (ref 80.0–100.0)
Monocytes Absolute: 0.6 10*3/uL (ref 0.1–1.0)
Monocytes Relative: 10 %
Neutro Abs: 3.6 10*3/uL (ref 1.7–7.7)
Neutrophils Relative %: 60 %
Platelet Count: 370 10*3/uL (ref 150–400)
RBC: 4.06 MIL/uL (ref 3.87–5.11)
RDW: 13 % (ref 11.5–15.5)
WBC Count: 6 10*3/uL (ref 4.0–10.5)
nRBC: 0 % (ref 0.0–0.2)

## 2020-01-18 LAB — CMP (CANCER CENTER ONLY)
ALT: 13 U/L (ref 0–44)
AST: 14 U/L — ABNORMAL LOW (ref 15–41)
Albumin: 4.4 g/dL (ref 3.5–5.0)
Alkaline Phosphatase: 80 U/L (ref 38–126)
Anion gap: 7 (ref 5–15)
BUN: 17 mg/dL (ref 6–20)
CO2: 27 mmol/L (ref 22–32)
Calcium: 9.6 mg/dL (ref 8.9–10.3)
Chloride: 104 mmol/L (ref 98–111)
Creatinine: 0.87 mg/dL (ref 0.44–1.00)
GFR, Est AFR Am: >60 mL/min (ref >60)
GFR, Estimated: >60 mL/min (ref >60)
Glucose, Bld: 149 mg/dL — ABNORMAL HIGH (ref 70–99)
Potassium: 3.8 mmol/L (ref 3.5–5.1)
Sodium: 138 mmol/L (ref 135–145)
Total Bilirubin: 0.4 mg/dL (ref 0.3–1.2)
Total Protein: 7.3 g/dL (ref 6.5–8.1)

## 2020-01-22 ENCOUNTER — Encounter: Payer: Self-pay | Admitting: *Deleted

## 2020-01-22 ENCOUNTER — Other Ambulatory Visit: Payer: Self-pay | Admitting: Hematology

## 2020-01-22 ENCOUNTER — Ambulatory Visit (HOSPITAL_COMMUNITY)
Admission: RE | Admit: 2020-01-22 | Discharge: 2020-01-22 | Disposition: A | Discharge status: Home / Self Care | Payer: Medicaid Other | Source: Ambulatory Visit | Attending: Hematology | Admitting: Hematology

## 2020-01-22 ENCOUNTER — Other Ambulatory Visit: Payer: Self-pay

## 2020-01-22 DIAGNOSIS — C50212 Malignant neoplasm of upper-inner quadrant of left female breast: Secondary | ICD-10-CM | POA: Insufficient documentation

## 2020-01-22 DIAGNOSIS — N6459 Other signs and symptoms in breast: Secondary | ICD-10-CM | POA: Diagnosis present

## 2020-01-22 DIAGNOSIS — Z171 Estrogen receptor negative status [ER-]: Secondary | ICD-10-CM

## 2020-01-22 DIAGNOSIS — R234 Changes in skin texture: Secondary | ICD-10-CM

## 2020-01-22 MED ORDER — GADOBUTROL 1 MMOL/ML IV SOLN
7.0000 mL | Freq: Once | INTRAVENOUS | Status: AC | PRN
  Administered 2020-01-22: 7 mL via INTRAVENOUS

## 2020-01-22 MED ORDER — DOXYCYCLINE HYCLATE 100 MG PO TABS: 100.0000 mg | ORAL_TABLET | Freq: Two times a day (BID) | ORAL | 0 refills | Status: AC

## 2020-01-22 NOTE — Progress Notes (Signed)
Per Dr Maylon Peppers - patient needs a punch biopsy ASAP at [facility]. Referral placed.  I spoke to [place] at [facility] and relayed information. They will call patient this morning and schedule her with first available MD. I also called the patient and informed her of referral and reason for biopsy. She understood and was appreciative of the information.

## 2020-02-01 ENCOUNTER — Other Ambulatory Visit: Payer: Medicaid Other

## 2020-02-05 ENCOUNTER — Other Ambulatory Visit: Payer: Self-pay | Admitting: Surgery

## 2020-03-28 ENCOUNTER — Other Ambulatory Visit: Payer: Self-pay | Admitting: Family

## 2020-03-28 ENCOUNTER — Ambulatory Visit: Payer: Medicaid Other | Attending: Hematology | Admitting: Physical Therapy

## 2020-03-28 ENCOUNTER — Encounter: Payer: Self-pay | Admitting: Physical Therapy

## 2020-03-28 ENCOUNTER — Other Ambulatory Visit: Payer: Self-pay

## 2020-03-28 DIAGNOSIS — M25612 Stiffness of left shoulder, not elsewhere classified: Secondary | ICD-10-CM | POA: Insufficient documentation

## 2020-03-28 DIAGNOSIS — C50212 Malignant neoplasm of upper-inner quadrant of left female breast: Secondary | ICD-10-CM

## 2020-03-28 DIAGNOSIS — L599 Disorder of the skin and subcutaneous tissue related to radiation, unspecified: Secondary | ICD-10-CM | POA: Insufficient documentation

## 2020-03-28 DIAGNOSIS — I890 Lymphedema, not elsewhere classified: Secondary | ICD-10-CM | POA: Diagnosis present
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
## 2020-04-08 ENCOUNTER — Ambulatory Visit: Payer: Medicaid Other

## 2020-04-08 ENCOUNTER — Other Ambulatory Visit: Payer: Self-pay

## 2020-04-08 DIAGNOSIS — M25612 Stiffness of left shoulder, not elsewhere classified: Secondary | ICD-10-CM

## 2020-04-08 DIAGNOSIS — I890 Lymphedema, not elsewhere classified: Secondary | ICD-10-CM | POA: Diagnosis not present

## 2020-04-08 DIAGNOSIS — L599 Disorder of the skin and subcutaneous tissue related to radiation, unspecified: Secondary | ICD-10-CM

## 2020-04-08 NOTE — Therapy (Signed)
[place], [place], [address] Phone: [phone]   Fax:  [phone]  Physical Therapy Treatment  Patient Details  Name: Ariana Herrera MRN: [id number] Date of Birth: 1966/07/06 Referring Provider (PT): Dr. Marin Olp   Encounter Date: 04/08/2020   PT End of Session - 04/08/20 1207    Visit Number 2    Number of Visits 4    Date for PT Re-Evaluation 05/02/20    Authorization Type Medicaid    Authorization Time Period 04/01/20-05/05/20, 3 visits    Authorization - Visit Number 1    Authorization - Number of Visits 3    PT Start Time 1107    PT Stop Time 1205    PT Time Calculation (min) 58 min    Activity Tolerance Patient tolerated treatment well    Behavior During Therapy [facility] for tasks assessed/performed           Past Medical History:  Diagnosis Date  . Personal history of chemotherapy   . Personal history of radiation therapy     Past Surgical History:  Procedure Laterality Date  . BREAST LUMPECTOMY Left 03/2019  . IR REMOVAL TUN ACCESS W/ PORT W/O FL MOD SED  12/20/2019    There were no vitals filed for this visit.   Subjective Assessment - 04/08/20 1113    Subjective My Rt shoulder has started feeling sore, probably from overuse lately. My breast is bothering me today though.    Pertinent History DM, bipolar disorder, high cholesterol, asthma, history of blood clots, breast cancer treatment completed lumpectomy 07/17/18 on the left with SLNB 0/1 nodes removed.  Chemo completed except for 3 sessions and radiation completed all in [place].    Patient Stated Goals to get the swelling down    Currently in Pain? Yes    Pain Score 5     Pain Location Breast    Pain Orientation Left    Pain Descriptors / Indicators Sharp    Pain Type Surgical pain    Pain Onset More than a month ago    Pain Frequency Intermittent    Aggravating Factors  nothing much    Pain Relieving Factors nothing                               [facility] Adult PT Treatment/Exercise - 04/08/20 0001      Manual Therapy   Manual Therapy Myofascial release;Manual Lymphatic Drainage (MLD);Passive ROM;Soft tissue mobilization    Soft tissue mobilization Scar tissue massage at medial breast incision    Myofascial Release To Lt axilla during P/ROM very gently as pt very tender with limited tolerance for stretch    Manual Lymphatic Drainage (MLD) In Supine: Short neck, superficial and deep abdomials, Rt axillary and Lt inguinal nodes, anterior inter-axillary and Lt axillo-inguinal anastomosis, then focused on Lt breast, then into Rt S/L for brief work at lateral breast redirecting fluid towards posterior inter-axillary and Lt axillo-inguinal anastomosis, then finished retracing steps in supine beginning to instruct pt throughout    Passive ROM In Supine to Lt shoulder into flexion, abduction , and D2 to tolerance                       PT Long Term Goals - 03/28/20 1019      PT LONG TERM GOAL #1   Title Pt will receive appropriate compression garments for long term management of  lymphedema    Baseline measured for sleeve, gauntlet, bra on 6/4 by Melissa    Time 5    Period Weeks    Status New    Target Date 05/02/20      PT LONG TERM GOAL #2   Title Pt will be independent with self MLD for long term management of lymphedema.    Time 5    Period Weeks    Status New    Target Date 05/02/20      PT LONG TERM GOAL #3   Title Pt will demonstrate 150 degrees of left shoulder abduction to allow her to reach out to the side.    Baseline 104    Time 5    Period Weeks    Status New    Target Date 05/02/20      PT LONG TERM GOAL #4   Title Pt will report a 75% improvement in pain in left breast to allow improved comfort.    Time 5    Period Weeks    Status New    Target Date 05/02/20                 Plan - 04/08/20 1314    Clinical Impression Statement First session of  Lt breast manual lymph drainage and beginning to instruct pt while performing in basic anatomy of lymphatic system and sequence of MLD. Also incorporated Lt shoulder P/ROM during MLD with MFR which she had limited tolerance for due to sensitivity. She also required multiple VCs to relax due to muscle guarding which seems to be causing most of shoulder discomfort. Pt reports breast feeling much softer and less heavy by end of session.    Personal Factors and Comorbidities Fitness    Comorbidities odd presentation of symptoms, chemo reaction    Examination-Activity Limitations Sleep    Examination-Participation Restrictions Yard Work;Cleaning;Meal Prep;Community Activity;Driving;Laundry;Shop    Stability/Clinical Decision Making Stable/Uncomplicated    Rehab Potential Good    PT Frequency Other (comment)   3 visits per Josem Kaufmann   PT Duration 6 weeks    PT Treatment/Interventions ADLs/Self Care Home Management;Patient/family education;Manual lymph drainage;Compression bandaging;Manual techniques;Passive range of motion;Scar mobilization;Vasopneumatic Device;Taping;Therapeutic exercise    PT Next Visit Plan cont MLD to L breast, instruct pt and issue handout, L shoulder ROM and issue HEP for this, scar massage    Consulted and Agree with Plan of Care Patient           Patient will benefit from skilled therapeutic intervention in order to improve the following deficits and impairments:  Pain, Impaired UE functional use, Increased fascial restricitons, Decreased strength, Decreased knowledge of precautions, Decreased range of motion, Decreased scar mobility, Increased edema  Visit Diagnosis: Lymphedema, not elsewhere classified  Stiffness of left shoulder, not elsewhere classified  Disorder of the skin and subcutaneous tissue related to radiation, unspecified     Problem List Patient Active Problem List   Diagnosis Date Noted  . Breast cancer ([place]) 11/26/2019  . Chemotherapy-induced neuropathy  ([place]) 11/26/2019  . Deep vein thrombosis (DVT) of proximal vein of right lower extremity ([place]) 11/26/2019    Otelia Limes, PTA 04/08/2020, 1:20 PM  [place], [place], [address] Phone: [phone]   Fax:  [phone]  Name: Tiaira Arambula MRN: [id number] Date of Birth: Feb 03, 1966

## 2020-04-15 ENCOUNTER — Other Ambulatory Visit: Payer: Self-pay

## 2020-04-15 ENCOUNTER — Ambulatory Visit: Payer: Medicaid Other

## 2020-04-15 DIAGNOSIS — I890 Lymphedema, not elsewhere classified: Secondary | ICD-10-CM | POA: Diagnosis not present

## 2020-04-15 DIAGNOSIS — L599 Disorder of the skin and subcutaneous tissue related to radiation, unspecified: Secondary | ICD-10-CM

## 2020-04-15 DIAGNOSIS — M25612 Stiffness of left shoulder, not elsewhere classified: Secondary | ICD-10-CM

## 2020-04-15 NOTE — Therapy (Addendum)
[place], [place], [address] Phone: [phone]   Fax:  [phone]  Physical Therapy Treatment  Patient Details  Name: Ariana Herrera MRN: [id number] Date of Birth: 1965/11/13 Referring Provider (PT): Dr. Marin Olp   Encounter Date: 04/15/2020   PT End of Session - 04/15/20 1110    Visit Number 3    Number of Visits 4    Date for PT Re-Evaluation 05/02/20    Authorization Type Medicaid    Authorization Time Period 04/01/20-05/05/20, 3 visits    Authorization - Visit Number 2    Authorization - Number of Visits 3    PT Start Time 1006    PT Stop Time 1108    PT Time Calculation (min) 62 min    Activity Tolerance Patient tolerated treatment well    Behavior During Therapy [facility] for tasks assessed/performed           Past Medical History:  Diagnosis Date  . Personal history of chemotherapy   . Personal history of radiation therapy     Past Surgical History:  Procedure Laterality Date  . BREAST LUMPECTOMY Left 03/2019  . IR REMOVAL TUN ACCESS W/ PORT W/O FL MOD SED  12/20/2019    There were no vitals filed for this visit.   Subjective Assessment - 04/15/20 1010    Subjective My Lt breast stayed down since last session. I've been doing across the chest for the massage but couldn't remember the rest.    Pertinent History DM, bipolar disorder, high cholesterol, asthma, history of blood clots, breast cancer treatment completed lumpectomy 07/17/18 on the left with SLNB 0/1 nodes removed.  Chemo completed except for 3 sessions and radiation completed all in [place].    Patient Stated Goals to get the swelling down    Currently in Pain? Yes    Pain Score 5     Pain Location Shoulder    Pain Orientation Right    Pain Descriptors / Indicators Sharp    Pain Type Chronic pain    Pain Onset More than a month ago    Pain Frequency Intermittent    Aggravating Factors  depending on how I use it    Pain  Relieving Factors nothing                             [facility] Adult PT Treatment/Exercise - 04/15/20 0001      Shoulder Exercises: Supine   Horizontal ABduction Strengthening;Both;5 reps;Theraband    Theraband Level (Shoulder Horizontal ABduction) Level 1 (Yellow)    Horizontal ABduction Limitations Some pain with Rt UE so instructed to work within pain tolerance    External Rotation Strengthening;Both;5 reps;Theraband    Theraband Level (Shoulder External Rotation) Level 1 (Yellow)    External Rotation Limitations Tactile cuing and demo to keep elbows adducted at sides    Flexion Strengthening;Both;10 reps;Theraband   Narrow and Wide Grip 10 times each   Theraband Level (Shoulder Flexion) Level 1 (Yellow)    Flexion Limitations Cuing to again work within pain tolerance of Rt UE    Diagonals Strengthening;Right;Left;Theraband;Other (comment)    Theraband Level (Shoulder Diagonals) Level 1 (Yellow)    Diagonals Limitations Had pt only do 2x with each UE but had pain with Rt and max discomfort with Lt so did not cont or add to HEP      Manual Therapy   Soft tissue mobilization Scar tissue massage at  medial breast incision    Myofascial Release To Lt axilla during P/ROM very gently as pt very tender with limited tolerance for stretch    Manual Lymphatic Drainage (MLD) In Supine: Short neck, superficial and deep abdomials, Rt axillary and Lt inguinal nodes, Rt intact upper quadrant sequence then anterior inter-axillary and Lt axillo-inguinal anastomosis, then focused on Lt breast, then into Rt S/L for brief work at lateral breast redirecting fluid towards posterior inter-axillary and Lt axillo-inguinal anastomosis, then finished retracing steps in supine beginning to instruct pt throughout    Passive ROM In Supine to Lt shoulder into flexion, abduction , and D2 to tolerance                  PT Education - 04/15/20 1134    Education Details Self MLD and supine scapular  series modified    Person(s) Educated Patient;Spouse   fiancee   Methods Explanation;Demonstration;Verbal cues;Tactile cues;Handout    Comprehension Verbalized understanding;Returned demonstration;Need further instruction;Tactile cues required               PT Long Term Goals - 03/28/20 1019      PT LONG TERM GOAL #1   Title Pt will receive appropriate compression garments for long term management of lymphedema    Baseline measured for sleeve, gauntlet, bra on 6/4 by Melissa    Time 5    Period Weeks    Status New    Target Date 05/02/20      PT LONG TERM GOAL #2   Title Pt will be independent with self MLD for long term management of lymphedema.    Time 5    Period Weeks    Status New    Target Date 05/02/20      PT LONG TERM GOAL #3   Title Pt will demonstrate 150 degrees of left shoulder abduction to allow her to reach out to the side.    Baseline 104    Time 5    Period Weeks    Status New    Target Date 05/02/20      PT LONG TERM GOAL #4   Title Pt will report a 75% improvement in pain in left breast to allow improved comfort.    Time 5    Period Weeks    Status New    Target Date 05/02/20                 Plan - 04/15/20 1111    Clinical Impression Statement Continued with manual lymph drainage to Lt breast today continuing with instructing pt and her fiancee that came today as well. Had him return demo and hand over hand technique used to instruct in correct direction and lighter pressure. He did struggle with this but was very attentive during session paying attention to therapists technique. Also progressed HEP to include supine scapular series but not including D2 as this was painful to her Rt shoulder (chronic issues with this). Also heavily advised her to avoid pain at Rt shoulder with these exercises to avoid making shoulder worse. Pt verbalized understanding.    Personal Factors and Comorbidities Fitness    Comorbidities odd presentation of  symptoms, chemo reaction    Examination-Activity Limitations Sleep    Examination-Participation Restrictions Yard Work;Cleaning;Meal Prep;Community Activity;Driving;Laundry;Shop    Stability/Clinical Decision Making Stable/Uncomplicated    Rehab Potential Good    PT Frequency Other (comment)   3 visits per auth   PT Duration 6 weeks    PT  Treatment/Interventions ADLs/Self Care Home Management;Patient/family education;Manual lymph drainage;Compression bandaging;Manual techniques;Passive range of motion;Scar mobilization;Vasopneumatic Device;Taping;Therapeutic exercise    PT Next Visit Plan Medicaid renewal next if pt to cont; reassess goals/ROM and cont MLD to L breast and instruct pt in same, L shoulder ROM and issue HEP for this, scar massage; review supine scapular series assessing pain tolerance and modifying prn    PT Home Exercise Plan Access Code: [email or web address]; seated HS and piriformis stretches, and supine LE strength    Consulted and Agree with Plan of Care Patient           Patient will benefit from skilled therapeutic intervention in order to improve the following deficits and impairments:  Pain, Impaired UE functional use, Increased fascial restricitons, Decreased strength, Decreased knowledge of precautions, Decreased range of motion, Decreased scar mobility, Increased edema  Visit Diagnosis: Lymphedema, not elsewhere classified  Stiffness of left shoulder, not elsewhere classified  Disorder of the skin and subcutaneous tissue related to radiation, unspecified     Problem List Patient Active Problem List   Diagnosis Date Noted  . Breast cancer ([place]) 11/26/2019  . Chemotherapy-induced neuropathy ([place]) 11/26/2019  . Deep vein thrombosis (DVT) of proximal vein of right lower extremity ([place]) 11/26/2019    Otelia Limes, PTA 04/15/2020, 11:50 AM  [employer] [place], [place], [address] Phone:  [phone]   Fax:  [phone]  Name: Ariana Herrera MRN: [id number] Date of Birth: [birth year]

## 2020-04-15 NOTE — Patient Instructions (Signed)
Self manual lymph drainage:     Cancer Rehab  [phone] Perform this sequence once a day.  Only give enough pressure to your skin, to make the skin move.  Hug yourself.  Do circles at your neck just above your collarbones.  Repeat this 10 times.  Diaphragmatic - Supine   Inhale through nose making navel move out toward hands. Exhale through puckered lips, hands follow navel in. Repeat _5__ times. Rest _10__ seconds between repeats.   Axilla - One at a Time   Using full weight of flat hand and fingers at center of uninvolved armpit, make _10__ in-place circles.   LEG: Inguinal Nodes Stimulation   With small finger side of hand against hip crease on involved side, gently perform circles at the crease. Repeat __10_ times.   1) Axilla to Inguinal Nodes - Pump   On involved side, pump _4__ times from armpit along side of trunk to hip crease.  Now gently stretch skin from the involved side to the uninvolved side across the chest at the shoulder line.  Repeat that 4 times.  Draw an imaginary diagonal line from upper outer breast through the nipple area toward lower inner breast.  Direct fluid upward and inward from this line toward the pathway across your upper chest .  Do this in three rows to treat all of the upper inner breast tissue, and do each row 3-4x.      Direct fluid to treat all of lower outer breast tissue downward and outward toward  pathway that is aimed at the left groin.  Finish by doing the pathways as described above going from your involved armpit to the same side groin and going across your upper chest from the involved shoulder to the uninvolved shoulder.  Repeat the steps above where you do circles in your left groin and right armpit.  Over Head Pull: Narrow and Wide Grip      On back, knees bent, feet flat, band across thighs, elbows straight but relaxed. Pull hands apart (start). Keeping elbows straight, bring arms up and over head, hands toward floor. Keep pull  steady on band. Hold momentarily. Return slowly, keeping pull steady, back to start. Then do same with a wider grip on the band (past shoulder width) Repeat _5-10__ times. Band color __yellow____   Side Pull: Double Arm   On back, knees bent, feet flat. Arms perpendicular to body, shoulder level, elbows straight but relaxed. Pull arms out to sides, elbows straight. Resistance band comes across collarbones, hands toward floor. Hold momentarily. Slowly return to starting position. Repeat _5-10__ times. Band color _yellow____    Shoulder Rotation: Double Arm   On back, knees bent, feet flat, elbows tucked at sides, bent 90, hands palms up. Pull hands apart and down toward floor, keeping elbows near sides. Hold momentarily. Slowly return to starting position. Repeat _5-10__ times. Band color __yellow____

## 2020-04-22 ENCOUNTER — Ambulatory Visit: Payer: Medicaid Other

## 2020-04-30 ENCOUNTER — Other Ambulatory Visit: Payer: Self-pay

## 2020-04-30 ENCOUNTER — Ambulatory Visit: Payer: Medicaid Other | Attending: Family

## 2020-04-30 DIAGNOSIS — I890 Lymphedema, not elsewhere classified: Secondary | ICD-10-CM | POA: Insufficient documentation

## 2020-04-30 DIAGNOSIS — L599 Disorder of the skin and subcutaneous tissue related to radiation, unspecified: Secondary | ICD-10-CM | POA: Insufficient documentation

## 2020-04-30 DIAGNOSIS — M25612 Stiffness of left shoulder, not elsewhere classified: Secondary | ICD-10-CM | POA: Diagnosis present

## 2020-04-30 NOTE — Patient Instructions (Addendum)
Strengthening: Resisted Flexion    Cancer Rehab [phone]    Hold tubing with left arm at side. Pull forward and up. Move shoulder through pain-free range of motion. Repeat _5-10___ times per set. Do _1-2___ sessions per day.  Strengthening: Resisted Internal Rotation    Hold tubing in left hand, elbow at side and forearm out. Rotate forearm in across body. Repeat _5-10___ times per set. Do _1-2___ sessions per day.  Strengthening: Resisted Extension    Hold tubing in left hand, arm forward. Pull arm back, elbow straight. At end of motion, pinch shoulder blade towards spine. Repeat __5-10__ times per set. Do __1-2__ sessions per day.   Strengthening: Resisted External Rotation    Hold tubing in left hand, elbow at side and forearm across body. Rotate forearm out. Repeat _5-10___ times per set. Do __1-2__ sessions per day.   Flexion (Eccentric) - Active-Assist (Cane)          Use unaffected arm to push affected arm forward. Avoid hiking shoulder (shoulder should NOT touch cheek). Keep palm relaxed. Slowly lower affected arm. Hold stretch for _5_ seconds repeating _5-10_ times, _1-2_ times a day.  Abduction (Eccentric) - Active-Assist (Cane)    Use unaffected arm to push affected arm out to side. Avoid hiking shoulder (shoulder should NOT touch cheek). Keep palm relaxed. Slowly lower affected arm. Hold stretch _5_ seconds repeating _5-10_ times, _1-2_ times a day.   CHEST: Doorway, Bilateral - Standing    Standing in doorway, place Lt hand on wall with elbow bent at shoulder height and place one foot in front of other. Shift weight onto front foot. Hold _10-20__ seconds. Do _3-5_ times, _1-2_ times a day.

## 2020-04-30 NOTE — Therapy (Addendum)
[place], [place], [address] Phone: [phone]   Fax:  [phone]  Physical Therapy Treatment  Patient Details  Name: Ariana Herrera MRN: [id number] Date of Birth: 1966/10/09 Referring Provider (PT): Dr. Marin Olp   Encounter Date: 04/30/2020   PT End of Session - 04/30/20 1108    Visit Number 4    Number of Visits 4    Date for PT Re-Evaluation 05/02/20    Authorization Type Medicaid    Authorization Time Period 04/01/20-05/05/20, 3 visits    Authorization - Visit Number 3    Authorization - Number of Visits 3    PT Start Time 1003    PT Stop Time 1107    PT Time Calculation (min) 64 min    Activity Tolerance Patient tolerated treatment well    Behavior During Therapy [facility] for tasks assessed/performed           Past Medical History:  Diagnosis Date  . Personal history of chemotherapy   . Personal history of radiation therapy     Past Surgical History:  Procedure Laterality Date  . BREAST LUMPECTOMY Left 03/2019  . IR REMOVAL TUN ACCESS W/ PORT W/O FL MOD SED  12/20/2019    There were no vitals filed for this visit.   Subjective Assessment - 04/30/20 1007    Subjective I still haven't heard about my compression. I need to make today my last today so I brought my fiancee to review the MLD today. I couldn't do the new exercises because of my Rt shoulder.    Pertinent History DM, bipolar disorder, high cholesterol, asthma, history of blood clots, breast cancer treatment completed lumpectomy 07/17/18 on the left with SLNB 0/1 nodes removed.  Chemo completed except for 3 sessions and radiation completed all in [place].    Patient Stated Goals to get the swelling down    Currently in Pain? No/denies   Rt shoulder is achy from old injury             [facility] PT Assessment - 04/30/20 0001      AROM   Left Shoulder Flexion 147 Degrees    Left Shoulder ABduction 151 Degrees                          [facility] Adult PT Treatment/Exercise - 04/30/20 0001      Shoulder Exercises: Standing   External Rotation Strengthening;Left;5 reps;Theraband    Theraband Level (Shoulder External Rotation) Level 2 (Red)   but pt to use her yellow at home initially with all   Internal Rotation Strengthening;Left;5 reps;Theraband    Theraband Level (Shoulder Internal Rotation) Level 2 (Red)    Flexion Strengthening;Left;5 reps;Theraband    Theraband Level (Shoulder Flexion) Level 2 (Red)    Extension Strengthening;Left;5 reps;Theraband    Theraband Level (Shoulder Extension) Level 2 (Red)      Manual Therapy   Soft tissue mobilization Scar tissue massage at medial breast incision    Myofascial Release To Lt axilla during P/ROM very gently as pt still very tender with limited tolerance for stretch    Manual Lymphatic Drainage (MLD) In Supine: Short neck, 5 diaphragmatic breaths, Rt axillary and Lt inguinal nodes, Rt intact upper quadrant sequence then anterior inter-axillary and Lt axillo-inguinal anastomosis, then focused on Lt breast redirecting towards coordinating anastomsis reviewing technique with pt throughout and having her return demo.     Passive ROM In Supine to Lt shoulder  into flexion, abduction , and D2 to tolerance                  PT Education - 04/30/20 1018    Education Details Lt [place] with red issued, but pt to use her yellow for now at home and progress to red later.    Person(s) Educated Patient    Methods Explanation;Demonstration;Handout    Comprehension Verbalized understanding;Returned demonstration;Tactile cues required               PT Long Term Goals - 04/30/20 1019      PT LONG TERM GOAL #1   Title Pt will receive appropriate compression garments for long term management of lymphedema    Baseline measured for sleeve, gauntlet, bra on 6/4 by Melissa; these have not arrived yet due to awaiting [employer] coverage-04/30/20    Status  Partially Met      PT LONG TERM GOAL #2   Title Pt will be independent with self MLD for long term management of lymphedema.    Baseline Pt and fiancee have been instructed in this-04/30/20    Status Achieved      PT LONG TERM GOAL #3   Title Pt will demonstrate 150 degrees of left shoulder abduction to allow her to reach out to the side.    Baseline 104; 151 degrees after session today-04/30/20    Status Achieved      PT LONG TERM GOAL #4   Title Pt will report a 75% improvement in pain in left breast to allow improved comfort.    Baseline 50% reported at this time - 04/30/20    Status Partially Met                 Plan - 04/30/20 1300    Clinical Impression Statement Pt reports needing to make today her last session due to transportation issues. However, she has made excellent progress thus far. She has met her A/ROM goal and being independent with self MLD. Also good progress towards her pain reduction goal and compression garments goals (see goals for updates, and issued fitter number for pt to check ETA of garments). Changed HEP to include [place] for Lt shoulder as supine scapular series was increasing Rt shoulder pain. Instructed her to use her yellow theraband at home for now until strength improves and pain reduces. She verbalized understanding and knows she can call clinic with any follow up questions.    Personal Factors and Comorbidities Fitness    Comorbidities odd presentation of symptoms, chemo reaction    Examination-Activity Limitations Sleep    Examination-Participation Restrictions Yard Work;Cleaning;Meal Prep;Community Activity;Driving;Laundry;Shop    Stability/Clinical Decision Making Stable/Uncomplicated    Rehab Potential Good    PT Frequency Other (comment)   3 visits per Josem Kaufmann   PT Duration 6 weeks    PT Treatment/Interventions ADLs/Self Care Home Management;Patient/family education;Manual lymph drainage;Compression bandaging;Manual techniques;Passive range of  motion;Scar mobilization;Vasopneumatic Device;Taping;Therapeutic exercise    PT Next Visit Plan D/C this visit.    PT Home Exercise Plan Stop supine scapular series and begin [place]; cont self MLD    Consulted and Agree with Plan of Care Patient           Patient will benefit from skilled therapeutic intervention in order to improve the following deficits and impairments:  Pain, Impaired UE functional use, Increased fascial restricitons, Decreased strength, Decreased knowledge of precautions, Decreased range of motion, Decreased scar mobility, Increased edema  Visit Diagnosis: Lymphedema, not elsewhere classified  Stiffness of left shoulder, not elsewhere classified  Disorder of the skin and subcutaneous tissue related to radiation, unspecified     Problem List Patient Active Problem List   Diagnosis Date Noted  . Breast cancer ([place]) 11/26/2019  . 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 Deep vein thrombosis (DVT) of proximal vein of right lower extremity ([place]) 11/26/2019    Otelia Limes, PTA 04/30/2020, 1:16 PM  [place], [place], [address] Phone: [phone]   Fax:  [phone]  Name: Dali Kraner MRN: [id number] Date of Birth: 14-Apr-1966  PHYSICAL THERAPY DISCHARGE SUMMARY  Visits from Start of Care: 4  Current functional level related to goals / functional outcomes: All goals met or partially met- see above   Remaining deficits: See above -still has some pain in L breast though it is improving   Education / Equipment: HEP, self MLD Plan: Patient agrees to discharge.  Patient goals were not met. Patient is being discharged due to the patient's request.  ?????    Allyson Sabal [place], [place] 04/30/20 1:54 PM

## 2020-05-27 ENCOUNTER — Other Ambulatory Visit: Payer: Self-pay

## 2020-05-27 ENCOUNTER — Other Ambulatory Visit: Payer: Self-pay | Admitting: Family

## 2020-05-27 ENCOUNTER — Inpatient Hospital Stay: Payer: Medicaid Other | Attending: Hematology & Oncology | Admitting: Family

## 2020-05-27 ENCOUNTER — Encounter: Payer: Self-pay | Admitting: Family

## 2020-05-27 ENCOUNTER — Inpatient Hospital Stay: Payer: Medicaid Other

## 2020-05-27 VITALS — BP 139/77 | HR 95 | Temp 98.3°F | Resp 18 | Ht 62.0 in | Wt 172.4 lb

## 2020-05-27 DIAGNOSIS — C50212 Malignant neoplasm of upper-inner quadrant of left female breast: Secondary | ICD-10-CM

## 2020-05-27 DIAGNOSIS — I890 Lymphedema, not elsewhere classified: Secondary | ICD-10-CM

## 2020-05-27 DIAGNOSIS — Z171 Estrogen receptor negative status [ER-]: Secondary | ICD-10-CM

## 2020-05-27 DIAGNOSIS — Z923 Personal history of irradiation: Secondary | ICD-10-CM | POA: Diagnosis not present

## 2020-05-27 DIAGNOSIS — N6459 Other signs and symptoms in breast: Secondary | ICD-10-CM | POA: Diagnosis not present

## 2020-05-27 DIAGNOSIS — Z86718 Personal history of other venous thrombosis and embolism: Secondary | ICD-10-CM | POA: Diagnosis not present

## 2020-05-27 DIAGNOSIS — Z853 Personal history of malignant neoplasm of breast: Secondary | ICD-10-CM | POA: Diagnosis present

## 2020-05-27 DIAGNOSIS — Z9221 Personal history of antineoplastic chemotherapy: Secondary | ICD-10-CM | POA: Insufficient documentation

## 2020-05-27 DIAGNOSIS — I824Y1 Acute embolism and thrombosis of unspecified deep veins of right proximal lower extremity: Secondary | ICD-10-CM

## 2020-05-27 LAB — CBC WITH DIFFERENTIAL (CANCER CENTER ONLY)
Abs Immature Granulocytes: 0.04 10*3/uL (ref 0.00–0.07)
Basophils Absolute: 0 10*3/uL (ref 0.0–0.1)
Basophils Relative: 1 %
Eosinophils Absolute: 0.1 10*3/uL (ref 0.0–0.5)
Eosinophils Relative: 1 %
HCT: 38.1 % (ref 36.0–46.0)
Hemoglobin: 13.1 g/dL (ref 12.0–15.0)
Immature Granulocytes: 1 %
Lymphocytes Relative: 27 %
Lymphs Abs: 1.7 10*3/uL (ref 0.7–4.0)
MCH: 30.7 pg (ref 26.0–34.0)
MCHC: 34.4 g/dL (ref 30.0–36.0)
MCV: 89.2 fL (ref 80.0–100.0)
Monocytes Absolute: 0.4 10*3/uL (ref 0.1–1.0)
Monocytes Relative: 7 %
Neutro Abs: 3.9 10*3/uL (ref 1.7–7.7)
Neutrophils Relative %: 63 %
Platelet Count: 417 10*3/uL — ABNORMAL HIGH (ref 150–400)
RBC: 4.27 MIL/uL (ref 3.87–5.11)
RDW: 13.4 % (ref 11.5–15.5)
WBC Count: 6.1 10*3/uL (ref 4.0–10.5)
nRBC: 0 % (ref 0.0–0.2)

## 2020-05-27 LAB — CMP (CANCER CENTER ONLY)
ALT: 14 U/L (ref 0–44)
AST: 13 U/L — ABNORMAL LOW (ref 15–41)
Albumin: 4.1 g/dL (ref 3.5–5.0)
Alkaline Phosphatase: 74 U/L (ref 38–126)
Anion gap: 10 (ref 5–15)
BUN: 11 mg/dL (ref 6–20)
CO2: 24 mmol/L (ref 22–32)
Calcium: 9.4 mg/dL (ref 8.9–10.3)
Chloride: 102 mmol/L (ref 98–111)
Creatinine: 0.75 mg/dL (ref 0.44–1.00)
GFR, Est AFR Am: >60 mL/min (ref >60)
GFR, Estimated: >60 mL/min (ref >60)
Glucose, Bld: 283 mg/dL — ABNORMAL HIGH (ref 70–99)
Potassium: 4.2 mmol/L (ref 3.5–5.1)
Sodium: 136 mmol/L (ref 135–145)
Total Bilirubin: 0.3 mg/dL (ref 0.3–1.2)
Total Protein: 6.8 g/dL (ref 6.5–8.1)

## 2020-05-27 NOTE — Progress Notes (Signed)
Hematology and Oncology Follow Up Visit  Ariana Herrera [id number] [birth year] 54 y.o. 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 Path: invasive ductal carcinoma, Grade 3, tumor 2.1cm, margins negative, sentinel LN negative; pT2,pN0 Late 07/2018 - 12/2018: adjuvant AC x 4 cycles, followed by weekly Taxol x 10 cycles (d/c'ed due to neuropathy) 01/2019 - 02/2019: adjuvant RT  Late 02/2019: calcifications in the UOQ of left breast, 6cm from nipple, suspicious; bx'ed ? Path: atrophic breast parenchyma with stromal fibrosis; no atypical hyperplasia or malignancy On surveillance   Current Therapy:   Observation   Interim History:  Ariana Herrera is here today for follow-up. She is doing well and has completed lymphedema therapy. She learned how to massage and reduce the lymphedema in the left breast which seems to be improved.  Bilateral breast exam today was negative. She has some atrophy/thickening of the skin (fibrosis) of the left breast post radiation.  She does self breast exams regularly and will be due again for mammogram in March 2022.  No fever, chills, n/v, cough, rash, dizziness, chest pain, palpitations, abdominal pain or changes in bowel or bladder habits.  She has chronic constipation and states that she has seen GI and will be scheduled a capsule endosocpy.  She states that she has SOB secondary to bronchitis and asthma.  No episodes of bleeding. No bruising or petechiae.  No swelling or tenderness in her extremities.  She has neuropathy in her hands and feet that is stable/unchanged.  She states that she has stumbled a few times but denies falls or syncope.  She is still smoking 1 ppd but plans to try and quit.  She had 7 more teeth pulled and has temporary dentures in place at this time. She  has an ok appetite and is on soft foods. She is hydrating well and her weight is stable.   ECOG Performance Status: 1 - Symptomatic but completely ambulatory  Medications:  Allergies as of 05/27/2020      Reactions   Lithium Other (See Comments)   Spinal fluid built up in brain   Penicillins Other (See Comments)   UNKNOWN CHILDHOOD REACTION      Medication List       Accurate as of May 27, 2020  1:24 PM. If you have any questions, ask your nurse or doctor.        STOP taking these medications   ibuprofen 400 MG tablet Commonly known as: ADVIL Stopped by: Laverna Peace, NP     TAKE these medications   cyclobenzaprine 10 MG tablet Commonly known as: FLEXERIL Take 10 mg by mouth 2 (two) times daily.   Eliquis 5 MG Tabs tablet Generic drug: apixaban Take 5 mg by mouth 2 (two) times daily.   gabapentin 800 MG tablet Commonly known as: NEURONTIN Take 800 mg by mouth 3 (three) times daily.   glipiZIDE 10 MG tablet Commonly known as: GLUCOTROL Take 10 mg by mouth 2 (two) times daily before a meal.   HYDROcodone-acetaminophen 5-325 MG tablet Commonly known as: NORCO/VICODIN Take 1 tablet by mouth 2 (two) times daily.   metFORMIN 1000 MG tablet Commonly known as: GLUCOPHAGE Take 1,000 mg by mouth 2 (two) times daily with a meal.   omeprazole 40 MG capsule Commonly known as: PRILOSEC Take 40 mg by mouth daily.  Allergies:  Allergies  Allergen Reactions  . Lithium Other (See Comments)    Spinal fluid built up in brain  . Penicillins Other (See Comments)    UNKNOWN CHILDHOOD REACTION    Past Medical History, Surgical history, Social history, and Family History were reviewed and updated.  Review of Systems: All other 10 point review of systems is negative.   Physical Exam:  height is 5' 2"  (1.575 m) and weight is 172 lb 6.4 oz (78.2 kg). Her oral temperature is 98.3 F (36.8 C). Her blood pressure is 139/77 and her pulse is 95. Her respiration is 18  and oxygen saturation is 99%.   Wt Readings from Last 3 Encounters:  05/27/20 172 lb 6.4 oz (78.2 kg)  11/26/19 151 lb 1.3 oz (68.5 kg)    Ocular: Sclerae unicteric, pupils equal, round and reactive to light Ear-nose-throat: Oropharynx clear, dentition fair Lymphatic: No cervical, supraclavicular or axillary adenopathy Lungs no rales or rhonchi, good excursion bilaterally Heart regular rate and rhythm, no murmur appreciated Abd soft, nontender, positive bowel sounds, no liver or spleen tip palpated on exam, no fluid wave  MSK no focal spinal tenderness, no joint edema Neuro: non-focal, well-oriented, appropriate affect Breasts: Bilateral breast exam negative. She has skin atrophy/thickening (fibrosis) of the left breast due to radiation. No mass, lesion or rash noted.   Lab Results  Component Value Date   WBC 6.1 05/27/2020   HGB 13.1 05/27/2020   HCT 38.1 05/27/2020   MCV 89.2 05/27/2020   PLT 417 (H) 05/27/2020   No results found for: FERRITIN, IRON, TIBC, UIBC, IRONPCTSAT Lab Results  Component Value Date   RBC 4.27 05/27/2020   No results found for: KPAFRELGTCHN, LAMBDASER, KAPLAMBRATIO No results found for: IGGSERUM, IGA, IGMSERUM No results found for: Ronnald Herrera, A1GS, A2GS, Tillman Sers, SPEI   Chemistry      Component Value Date/Time   NA 138 01/18/2020 1354   K 3.8 01/18/2020 1354   CL 104 01/18/2020 1354   CO2 27 01/18/2020 1354   BUN 17 01/18/2020 1354   CREATININE 0.87 01/18/2020 1354      Component Value Date/Time   CALCIUM 9.6 01/18/2020 1354   ALKPHOS 80 01/18/2020 1354   AST 14 (L) 01/18/2020 1354   ALT 13 01/18/2020 1354   BILITOT 0.4 01/18/2020 1354       Impression and Plan: Ariana Herrera is a very pleasant 54 yo caucasian female with history of stage IIA (pT2,pN0,cM0) IDC of the left breast, 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 She completed treatment (lumpectomy, chemo and radiation) in [place] in May 2020. She also developed  andRLE DVT and thrombus of her central line which she states was felt to be provoked.  She has done well and so far there has been no evidence of recurrence.  I spoke with Dr. Marin Olp and since she has completed 2 years of full dose anticoagulation with Eiquis she is ok to stop at this time. We will plan to see her again in 6 months for follow-up.  Mammogram is due again in March 2022.  She promises to contact our office with any questions or concerns. We can certainly see her sooner if needed.  Greater than 50% of her visit was spent counseling and coordinating care.   Laverna Peace, NP 8/3/20211:24 PM

## 2020-05-28 ENCOUNTER — Telehealth: Payer: Self-pay | Admitting: Hematology & Oncology

## 2020-05-28 NOTE — Telephone Encounter (Signed)
No los 8/3 

## 2020-08-20 ENCOUNTER — Emergency Department (HOSPITAL_COMMUNITY): Payer: Medicaid Other

## 2020-08-20 ENCOUNTER — Encounter (HOSPITAL_COMMUNITY): Payer: Self-pay | Admitting: Cardiology

## 2020-08-20 ENCOUNTER — Inpatient Hospital Stay (HOSPITAL_COMMUNITY)
Admission: EM | Admit: 2020-08-20 | Discharge: 2020-08-25 | DRG: 280 | Disposition: A | Discharge status: Home / Self Care | Payer: Medicaid Other | Attending: Internal Medicine | Admitting: Internal Medicine

## 2020-08-20 ENCOUNTER — Inpatient Hospital Stay (HOSPITAL_COMMUNITY): Payer: Medicaid Other

## 2020-08-20 ENCOUNTER — Other Ambulatory Visit: Payer: Self-pay

## 2020-08-20 DIAGNOSIS — E781 Pure hyperglyceridemia: Secondary | ICD-10-CM | POA: Diagnosis present

## 2020-08-20 DIAGNOSIS — Z86718 Personal history of other venous thrombosis and embolism: Secondary | ICD-10-CM

## 2020-08-20 DIAGNOSIS — E876 Hypokalemia: Secondary | ICD-10-CM | POA: Diagnosis not present

## 2020-08-20 DIAGNOSIS — R079 Chest pain, unspecified: Secondary | ICD-10-CM | POA: Diagnosis present

## 2020-08-20 DIAGNOSIS — Z853 Personal history of malignant neoplasm of breast: Secondary | ICD-10-CM | POA: Diagnosis not present

## 2020-08-20 DIAGNOSIS — Z923 Personal history of irradiation: Secondary | ICD-10-CM

## 2020-08-20 DIAGNOSIS — F319 Bipolar disorder, unspecified: Secondary | ICD-10-CM | POA: Diagnosis present

## 2020-08-20 DIAGNOSIS — E669 Obesity, unspecified: Secondary | ICD-10-CM

## 2020-08-20 DIAGNOSIS — Z9221 Personal history of antineoplastic chemotherapy: Secondary | ICD-10-CM

## 2020-08-20 DIAGNOSIS — E6609 Other obesity due to excess calories: Secondary | ICD-10-CM

## 2020-08-20 DIAGNOSIS — Z888 Allergy status to other drugs, medicaments and biological substances status: Secondary | ICD-10-CM

## 2020-08-20 DIAGNOSIS — C50919 Malignant neoplasm of unspecified site of unspecified female breast: Secondary | ICD-10-CM | POA: Diagnosis present

## 2020-08-20 DIAGNOSIS — E1165 Type 2 diabetes mellitus with hyperglycemia: Secondary | ICD-10-CM | POA: Diagnosis present

## 2020-08-20 DIAGNOSIS — Z6831 Body mass index (BMI) 31.0-31.9, adult: Secondary | ICD-10-CM | POA: Diagnosis not present

## 2020-08-20 DIAGNOSIS — C50212 Malignant neoplasm of upper-inner quadrant of left female breast: Secondary | ICD-10-CM | POA: Diagnosis not present

## 2020-08-20 DIAGNOSIS — R7989 Other specified abnormal findings of blood chemistry: Secondary | ICD-10-CM

## 2020-08-20 DIAGNOSIS — T380X5A Adverse effect of glucocorticoids and synthetic analogues, initial encounter: Secondary | ICD-10-CM | POA: Diagnosis present

## 2020-08-20 DIAGNOSIS — S46911A Strain of unspecified muscle, fascia and tendon at shoulder and upper arm level, right arm, initial encounter: Secondary | ICD-10-CM | POA: Diagnosis present

## 2020-08-20 DIAGNOSIS — I252 Old myocardial infarction: Secondary | ICD-10-CM | POA: Diagnosis not present

## 2020-08-20 DIAGNOSIS — Z20822 Contact with and (suspected) exposure to covid-19: Secondary | ICD-10-CM | POA: Diagnosis present

## 2020-08-20 DIAGNOSIS — K760 Fatty (change of) liver, not elsewhere classified: Secondary | ICD-10-CM | POA: Diagnosis present

## 2020-08-20 DIAGNOSIS — I2510 Atherosclerotic heart disease of native coronary artery without angina pectoris: Secondary | ICD-10-CM | POA: Diagnosis present

## 2020-08-20 DIAGNOSIS — Z79899 Other long term (current) drug therapy: Secondary | ICD-10-CM

## 2020-08-20 DIAGNOSIS — D7389 Other diseases of spleen: Secondary | ICD-10-CM | POA: Diagnosis present

## 2020-08-20 DIAGNOSIS — K859 Acute pancreatitis without necrosis or infection, unspecified: Secondary | ICD-10-CM | POA: Diagnosis present

## 2020-08-20 DIAGNOSIS — R778 Other specified abnormalities of plasma proteins: Secondary | ICD-10-CM

## 2020-08-20 DIAGNOSIS — Z794 Long term (current) use of insulin: Secondary | ICD-10-CM | POA: Diagnosis not present

## 2020-08-20 DIAGNOSIS — K5909 Other constipation: Secondary | ICD-10-CM | POA: Diagnosis present

## 2020-08-20 DIAGNOSIS — E1169 Type 2 diabetes mellitus with other specified complication: Secondary | ICD-10-CM | POA: Diagnosis present

## 2020-08-20 DIAGNOSIS — G8929 Other chronic pain: Secondary | ICD-10-CM | POA: Diagnosis present

## 2020-08-20 DIAGNOSIS — I214 Non-ST elevation (NSTEMI) myocardial infarction: Secondary | ICD-10-CM | POA: Diagnosis present

## 2020-08-20 DIAGNOSIS — E785 Hyperlipidemia, unspecified: Secondary | ICD-10-CM | POA: Diagnosis present

## 2020-08-20 DIAGNOSIS — Z8249 Family history of ischemic heart disease and other diseases of the circulatory system: Secondary | ICD-10-CM

## 2020-08-20 DIAGNOSIS — K8500 Idiopathic acute pancreatitis without necrosis or infection: Secondary | ICD-10-CM | POA: Diagnosis not present

## 2020-08-20 DIAGNOSIS — Z171 Estrogen receptor negative status [ER-]: Secondary | ICD-10-CM | POA: Diagnosis not present

## 2020-08-20 DIAGNOSIS — Z8719 Personal history of other diseases of the digestive system: Secondary | ICD-10-CM | POA: Diagnosis present

## 2020-08-20 DIAGNOSIS — F17210 Nicotine dependence, cigarettes, uncomplicated: Secondary | ICD-10-CM | POA: Diagnosis present

## 2020-08-20 DIAGNOSIS — Z880 Allergy status to penicillin: Secondary | ICD-10-CM

## 2020-08-20 HISTORY — DX: Atherosclerotic heart disease of native coronary artery without angina pectoris: I25.10

## 2020-08-20 HISTORY — DX: Acute myocardial infarction, unspecified: I21.9

## 2020-08-20 HISTORY — DX: Acute embolism and thrombosis of unspecified deep veins of right lower extremity: I82.401

## 2020-08-20 HISTORY — DX: Bipolar disorder, unspecified: F31.9

## 2020-08-20 HISTORY — DX: Obesity, unspecified: E11.69

## 2020-08-20 HISTORY — DX: Type 2 diabetes mellitus without complications: E11.9

## 2020-08-20 HISTORY — DX: Unspecified asthma, uncomplicated: J45.909

## 2020-08-20 HISTORY — DX: Angina pectoris, unspecified: I20.9

## 2020-08-20 HISTORY — DX: Obesity, unspecified: E66.9

## 2020-08-20 HISTORY — DX: Hyperlipidemia, unspecified: E78.5

## 2020-08-20 HISTORY — DX: Malignant neoplasm of unspecified site of unspecified female breast: C50.919

## 2020-08-20 LAB — TROPONIN I (HIGH SENSITIVITY)
Troponin I (High Sensitivity): 1534 ng/L (ref <18)
Troponin I (High Sensitivity): 1529 ng/L (ref <18)
Troponin I (High Sensitivity): 1415 ng/L (ref <18)

## 2020-08-20 LAB — HIV ANTIBODY (ROUTINE TESTING W REFLEX): HIV Screen 4th Generation wRfx: NONREACTIVE

## 2020-08-20 LAB — CBC
HCT: 38.3 % (ref 36.0–46.0)
Hemoglobin: 13.2 g/dL (ref 12.0–15.0)
MCH: 30.8 pg (ref 26.0–34.0)
MCHC: 34.5 g/dL (ref 30.0–36.0)
MCV: 89.3 fL (ref 80.0–100.0)
Platelets: 408 10*3/uL — ABNORMAL HIGH (ref 150–400)
RBC: 4.29 MIL/uL (ref 3.87–5.11)
RDW: 13.8 % (ref 11.5–15.5)
WBC: 12.8 10*3/uL — ABNORMAL HIGH (ref 4.0–10.5)
nRBC: 0 % (ref 0.0–0.2)

## 2020-08-20 LAB — TYPE AND SCREEN
ABO/RH(D): A POS
Antibody Screen: NEGATIVE

## 2020-08-20 LAB — I-STAT CHEM 8, ED
BUN: 14 mg/dL (ref 6–20)
Calcium, Ion: 1.11 mmol/L — ABNORMAL LOW (ref 1.15–1.40)
Chloride: 101 mmol/L (ref 98–111)
Creatinine, Ser: 0.6 mg/dL (ref 0.44–1.00)
Glucose, Bld: 334 mg/dL — ABNORMAL HIGH (ref 70–99)
HCT: 40 % (ref 36.0–46.0)
Hemoglobin: 13.6 g/dL (ref 12.0–15.0)
Potassium: 3.9 mmol/L (ref 3.5–5.1)
Sodium: 135 mmol/L (ref 135–145)
TCO2: 21 mmol/L — ABNORMAL LOW (ref 22–32)

## 2020-08-20 LAB — LIPID PANEL
Cholesterol: 170 mg/dL (ref 0–200)
HDL: 38 mg/dL — ABNORMAL LOW (ref >40)
LDL Cholesterol: 101 mg/dL — ABNORMAL HIGH (ref 0–99)
Total CHOL/HDL Ratio: 4.5 RATIO
Triglycerides: 153 mg/dL — ABNORMAL HIGH (ref <150)
VLDL: 31 mg/dL (ref 0–40)

## 2020-08-20 LAB — HEPARIN LEVEL (UNFRACTIONATED): Heparin Unfractionated: <0.1 IU/mL — ABNORMAL LOW (ref 0.30–0.70)

## 2020-08-20 LAB — PROTIME-INR
INR: 1 (ref 0.8–1.2)
Prothrombin Time: 13.2 seconds (ref 11.4–15.2)

## 2020-08-20 LAB — LIPASE, BLOOD: Lipase: 148 U/L — ABNORMAL HIGH (ref 11–51)

## 2020-08-20 LAB — HEPATIC FUNCTION PANEL
ALT: 23 U/L (ref 0–44)
AST: 23 U/L (ref 15–41)
Albumin: 3.4 g/dL — ABNORMAL LOW (ref 3.5–5.0)
Alkaline Phosphatase: 72 U/L (ref 38–126)
Bilirubin, Direct: 0.3 mg/dL — ABNORMAL HIGH (ref 0.0–0.2)
Indirect Bilirubin: 0.6 mg/dL (ref 0.3–0.9)
Total Bilirubin: 0.9 mg/dL (ref 0.3–1.2)
Total Protein: 6.1 g/dL — ABNORMAL LOW (ref 6.5–8.1)

## 2020-08-20 LAB — RESPIRATORY PANEL BY RT PCR (FLU A&B, COVID)
Influenza A by PCR: NEGATIVE
Influenza B by PCR: NEGATIVE
SARS Coronavirus 2 by RT PCR: NEGATIVE

## 2020-08-20 LAB — TSH: TSH: 1.011 u[IU]/mL (ref 0.350–4.500)

## 2020-08-20 LAB — I-STAT BETA HCG BLOOD, ED (MC, WL, AP ONLY): I-stat hCG, quantitative: 6.6 m[IU]/mL — ABNORMAL HIGH (ref <5)

## 2020-08-20 LAB — GLUCOSE, CAPILLARY: Glucose-Capillary: 240 mg/dL — ABNORMAL HIGH (ref 70–99)

## 2020-08-20 LAB — MAGNESIUM
Magnesium: 1.5 mg/dL — ABNORMAL LOW (ref 1.7–2.4)
Magnesium: 1.3 mg/dL — ABNORMAL LOW (ref 1.7–2.4)

## 2020-08-20 LAB — T4, FREE: Free T4: 0.75 ng/dL (ref 0.61–1.12)

## 2020-08-20 LAB — ABO/RH: ABO/RH(D): A POS

## 2020-08-20 MED ORDER — SODIUM CHLORIDE 0.9 % IV BOLUS
500.0000 mL | Freq: Once | INTRAVENOUS | Status: AC
  Administered 2020-08-20: 500 mL via INTRAVENOUS

## 2020-08-20 MED ORDER — NICOTINE 21 MG/24HR TD PT24
21.0000 mg | MEDICATED_PATCH | Freq: Every day | TRANSDERMAL | Status: DC
  Filled 2020-08-20 (×5): qty 1

## 2020-08-20 MED ORDER — QUETIAPINE FUMARATE 100 MG PO TABS
800.0000 mg | ORAL_TABLET | Freq: Every day | ORAL | Status: DC
  Administered 2020-08-21 – 2020-08-24 (×4): 800 mg via ORAL
  Filled 2020-08-20: qty 8
  Filled 2020-08-20: qty 4
  Filled 2020-08-20 (×2): qty 8
  Filled 2020-08-20: qty 4
  Filled 2020-08-20 (×2): qty 8
  Filled 2020-08-20: qty 4

## 2020-08-20 MED ORDER — MORPHINE SULFATE (PF) 2 MG/ML IV SOLN
2.0000 mg | INTRAVENOUS | Status: DC | PRN
  Administered 2020-08-20 (×2): 2 mg via INTRAVENOUS
  Filled 2020-08-20 (×2): qty 1

## 2020-08-20 MED ORDER — MAGNESIUM SULFATE 2 GM/50ML IV SOLN
2.0000 g | Freq: Once | INTRAVENOUS | Status: AC
  Administered 2020-08-20: 2 g via INTRAVENOUS
  Filled 2020-08-20: qty 50

## 2020-08-20 MED ORDER — ASPIRIN 81 MG PO CHEW
324.0000 mg | CHEWABLE_TABLET | Freq: Once | ORAL | Status: AC
  Administered 2020-08-20: 324 mg via ORAL
  Filled 2020-08-20: qty 4

## 2020-08-20 MED ORDER — HYDROMORPHONE HCL 1 MG/ML IJ SOLN
1.0000 mg | Freq: Once | INTRAMUSCULAR | Status: AC
  Administered 2020-08-20: 1 mg via INTRAVENOUS
  Filled 2020-08-20: qty 1

## 2020-08-20 MED ORDER — HEPARIN BOLUS VIA INFUSION
4000.0000 [IU] | Freq: Once | INTRAVENOUS | Status: AC
  Administered 2020-08-20: 4000 [IU] via INTRAVENOUS
  Filled 2020-08-20: qty 4000

## 2020-08-20 MED ORDER — ONDANSETRON HCL 4 MG/2ML IJ SOLN
4.0000 mg | Freq: Four times a day (QID) | INTRAMUSCULAR | Status: DC | PRN
  Administered 2020-08-20 – 2020-08-21 (×2): 4 mg via INTRAVENOUS
  Filled 2020-08-20 (×2): qty 2

## 2020-08-20 MED ORDER — LACTATED RINGERS IV SOLN: INTRAVENOUS | Status: DC

## 2020-08-20 MED ORDER — PANTOPRAZOLE SODIUM 40 MG IV SOLR
40.0000 mg | Freq: Two times a day (BID) | INTRAVENOUS | Status: DC
  Administered 2020-08-20 – 2020-08-25 (×10): 40 mg via INTRAVENOUS
  Filled 2020-08-20 (×10): qty 40

## 2020-08-20 MED ORDER — INSULIN ASPART 100 UNIT/ML ~~LOC~~ SOLN
0.0000 [IU] | Freq: Three times a day (TID) | SUBCUTANEOUS | Status: DC
  Administered 2020-08-22: 3 [IU] via SUBCUTANEOUS
  Administered 2020-08-23: 2 [IU] via SUBCUTANEOUS
  Administered 2020-08-23: 3 [IU] via SUBCUTANEOUS
  Administered 2020-08-23: 5 [IU] via SUBCUTANEOUS
  Administered 2020-08-24: 3 [IU] via SUBCUTANEOUS
  Administered 2020-08-24: 5 [IU] via SUBCUTANEOUS

## 2020-08-20 MED ORDER — FENTANYL CITRATE (PF) 100 MCG/2ML IJ SOLN
100.0000 ug | Freq: Once | INTRAMUSCULAR | Status: AC
  Administered 2020-08-20: 100 ug via INTRAVENOUS

## 2020-08-20 MED ORDER — INSULIN GLARGINE 100 UNIT/ML ~~LOC~~ SOLN
12.0000 [IU] | Freq: Every day | SUBCUTANEOUS | Status: DC
  Administered 2020-08-20: 12 [IU] via SUBCUTANEOUS
  Filled 2020-08-20 (×4): qty 0.12

## 2020-08-20 MED ORDER — HEPARIN (PORCINE) 25000 UT/250ML-% IV SOLN
1250.0000 [IU]/h | INTRAVENOUS | Status: DC
  Administered 2020-08-20: 800 [IU]/h via INTRAVENOUS
  Filled 2020-08-20 (×3): qty 250

## 2020-08-20 MED ORDER — MORPHINE SULFATE (PF) 4 MG/ML IV SOLN
4.0000 mg | Freq: Once | INTRAVENOUS | Status: AC
  Administered 2020-08-20: 4 mg via INTRAVENOUS
  Filled 2020-08-20: qty 1

## 2020-08-20 MED ORDER — KETOROLAC TROMETHAMINE 15 MG/ML IJ SOLN
15.0000 mg | Freq: Once | INTRAMUSCULAR | Status: AC
  Administered 2020-08-20: 15 mg via INTRAVENOUS
  Filled 2020-08-20: qty 1

## 2020-08-20 MED ORDER — ACETAMINOPHEN 325 MG PO TABS: 650.0000 mg | ORAL_TABLET | ORAL | Status: DC | PRN

## 2020-08-20 MED ORDER — SODIUM CHLORIDE 0.9% FLUSH
3.0000 mL | Freq: Two times a day (BID) | INTRAVENOUS | Status: DC
  Administered 2020-08-21: 3 mL via INTRAVENOUS
  Administered 2020-08-21: 10 mL via INTRAVENOUS
  Administered 2020-08-25: 3 mL via INTRAVENOUS

## 2020-08-20 MED ORDER — NITROGLYCERIN 0.4 MG SL SUBL
SUBLINGUAL_TABLET | SUBLINGUAL | Status: AC
  Filled 2020-08-20: qty 3

## 2020-08-20 MED ORDER — NITROGLYCERIN 0.4 MG SL SUBL
0.4000 mg | SUBLINGUAL_TABLET | SUBLINGUAL | Status: DC | PRN
  Administered 2020-08-20 (×4): 0.4 mg via SUBLINGUAL
  Filled 2020-08-20: qty 1

## 2020-08-20 MED ORDER — SODIUM CHLORIDE 0.9 % IV SOLN: Freq: Once | INTRAVENOUS | Status: AC

## 2020-08-20 MED ORDER — HYDROMORPHONE HCL 1 MG/ML IJ SOLN
1.0000 mg | INTRAMUSCULAR | Status: DC | PRN
  Administered 2020-08-20 – 2020-08-25 (×26): 1 mg via INTRAVENOUS
  Filled 2020-08-20 (×29): qty 1

## 2020-08-20 MED ORDER — ATORVASTATIN CALCIUM 40 MG PO TABS
40.0000 mg | ORAL_TABLET | Freq: Every day | ORAL | Status: DC
  Administered 2020-08-21 – 2020-08-25 (×5): 40 mg via ORAL
  Filled 2020-08-20 (×5): qty 1

## 2020-08-20 MED ORDER — METOPROLOL TARTRATE 12.5 MG HALF TABLET
12.5000 mg | ORAL_TABLET | Freq: Two times a day (BID) | ORAL | Status: DC
  Administered 2020-08-20 – 2020-08-23 (×6): 12.5 mg via ORAL
  Filled 2020-08-20 (×6): qty 1

## 2020-08-20 MED ORDER — FENTANYL CITRATE (PF) 100 MCG/2ML IJ SOLN
INTRAMUSCULAR | Status: AC
  Filled 2020-08-20: qty 2

## 2020-08-20 MED ORDER — HEPARIN BOLUS VIA INFUSION
2000.0000 [IU] | Freq: Once | INTRAVENOUS | Status: AC
  Administered 2020-08-20: 2000 [IU] via INTRAVENOUS
  Filled 2020-08-20: qty 2000

## 2020-08-20 MED ORDER — IOHEXOL 350 MG/ML SOLN
100.0000 mL | Freq: Once | INTRAVENOUS | Status: AC | PRN
  Administered 2020-08-20: 100 mL via INTRAVENOUS

## 2020-08-20 MED ORDER — SODIUM CHLORIDE 0.9% FLUSH: 3.0000 mL | INTRAVENOUS | Status: DC | PRN

## 2020-08-20 MED ORDER — ASPIRIN EC 81 MG PO TBEC
81.0000 mg | DELAYED_RELEASE_TABLET | Freq: Every day | ORAL | Status: DC
  Administered 2020-08-21 – 2020-08-25 (×5): 81 mg via ORAL
  Filled 2020-08-20 (×5): qty 1

## 2020-08-20 MED ORDER — SODIUM CHLORIDE 0.9 % IV SOLN: 250.0000 mL | INTRAVENOUS | Status: DC | PRN

## 2020-08-20 NOTE — Progress Notes (Signed)
Admitted to room 9, assisted to bed, IVs and cardiac monitor attached without difficulty.  No skin abnormalities.  Limb alert to left arm.  States pain is 6/10 in abdomen, assisted to reposition with some relief.  Up to BR without difficulty.

## 2020-08-20 NOTE — Consult Note (Addendum)
Cardiology consult    Patient ID: Ariana Herrera MRN: [id number]; DOB: [birth year]   Admission date: 08/20/2020  Primary Care Provider: Dulce Sellar, MD [facility] Cardiologist: No primary care provider on file. new [place] Electrophysiologist:  None   Chief Complaint:  epigastric pain   Patient Profile:   Ariana Herrera is a 54 y.o. female who is being seen today for the evaluation of epigastric pain at the request of DR. Wentz.  Ariana Herrera is a 55 y.o. 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  Recently she has been well though a few days ago saw urgent care for Rt should er pain, dx of strain and placed on steroids, also had been on steroids for bronchitis. With that pain she took NTG without relief.    Today pain woke her from sleep today.  Epigastric, + nausea.  radiation to back and ribs, no neck or arms. Here with significant pain   Has had ASA 324 mg  Fentanyl, dilaudid, and morphine   EKG:  The ECG that was done today  was personally reviewed and demonstrates SR with no acute ST changes  Read as prolonged QT but baseline is wavy    Hs Troponin 1534, second pending  No chest Xray but CT chest and abd for aortic dissection with acute uncomplicated pancreatitis, hepatic steatosis, Indeterminate 1.8 cm rounded area of hypoattenuation within the anterior aspect of the spleen. Further evaluation with contrast-enhanced MRI can be performed on a nonemergent basis.  No aortic dissection.   Lipase 148  Total protein 6.1, albumn 3.4, direct bili 0.3 COVID neg Na 135, K+  3.9 glucose 334 Cr 0.60  WBC 12.8, Hgb 13.2 plts 408  INR 1.0 HCG 6.6 she denies pregnancy has had tubes tied.    Past Medical History:  Diagnosis Date  . MI (myocardial infarction) ([place])    was in [place]  . Personal history of chemotherapy   . Personal history of radiation therapy     Past Surgical History:  Procedure Laterality Date  . BREAST LUMPECTOMY Left 03/2019  . IR REMOVAL TUN ACCESS W/ PORT W/O FL MOD SED  12/20/2019     Medications Prior to Admission: Prior to Admission medications   Medication Sig Start Date End Date Taking? Authorizing Provider  Dulaglutide (TRULICITY) 4.58 KD/9.8PJ SOPN Inject 0.75 mg into the skin every Tuesday.   Yes [provider]  fenofibrate (TRICOR) 145 MG tablet Take 145 mg by mouth daily.   Yes [provider]  HYDROcodone-acetaminophen (NORCO/VICODIN) 5-325 MG tablet Take 1 tablet by mouth in the morning, at noon, and at bedtime.  11/26/19  Yes [provider]  insulin glargine (LANTUS) 100 UNIT/ML injection Inject 25 Units into the skin at bedtime.   Yes [provider]  linaclotide (LINZESS) 290 MCG CAPS capsule Take 290 mcg by mouth daily as needed.   Yes [provider]  metFORMIN (GLUCOPHAGE) 1000 MG tablet Take 1,000 mg by mouth 2 (two) times daily with a meal. 11/26/19  Yes [provider]  predniSONE (DELTASONE) 10 MG tablet Take by mouth as directed. 6 day supply 08/18/20  Yes [provider]  QUEtiapine (SEROQUEL) 400 MG tablet Take 800 mg by mouth at bedtime.   Yes [provider]     Allergies:    Allergies  Allergen Reactions  . Lithium Other (See Comments)    Spinal fluid built up in brain  . Penicillins Other (See Comments)    UNKNOWN CHILDHOOD REACTION    Social History:   Social History   Socioeconomic History  . Marital status: Unknown    Spouse name: Not on file  . Number of children: Not on file  . Years of education: Not on file  .  Highest education level: Not on file  Occupational History  . Not on file  Tobacco Use  . Smoking status: Current Every Day Smoker    Packs/day: 2.00    Types: Cigarettes    Start date: 11/26/2019  . Smokeless tobacco: Never Used  Vaping Use  . Vaping Use: Never used  Substance and Sexual Activity  . Alcohol use: Never  . Drug use: Yes    Types: Marijuana  . Sexual activity: Not on file  Other Topics Concern  . Not on file  Social History Narrative  . Not on file   Social Determinants of Health   Financial Resource Strain:   . Difficulty of Paying Living Expenses: Not on file  Food Insecurity:   . Worried About Charity fundraiser in the Last Year: Not on file  . Ran Out of Food in the Last Year: Not on file  Transportation Needs:   . Lack of Transportation (Medical): Not on file  . Lack of Transportation (Non-Medical): Not on file  Physical Activity:   . Days of Exercise per Week: Not on file  . Minutes of Exercise per Session: Not on file  Stress:   . Feeling of Stress : Not on file  Social Connections:   . Frequency of Communication with Friends and Family: Not on file  . Frequency of Social Gatherings with Friends and Family: Not on file  . Attends Religious Services: Not on file  . Active Member of Clubs or Organizations: Not on file  . Attends Archivist Meetings: Not on file  . Marital Status: Not on file  Intimate Partner Violence:   . Fear of Current or Ex-Partner: Not on file  . Emotionally Abused: Not on file  . Physically Abused: Not on file  . Sexually Abused: Not on file    Family History:  Mother with heart attack in her 26s. The patient's family history includes Heart attack in her mother.    ROS:  Please see the history of present illness.  General:no colds or fevers, no weight changes Skin:no rashes or ulcers HEENT:no blurred vision, no congestion CV:see HPI PUL:see HPI GI:no diarrhea constipation or melena, no indigestion GU:no  hematuria, no dysuria MS:+ rt shoulder pain recently ,no joint pain, no claudication Neuro:no syncope, no lightheadedness Endo:+ diabetes, no thyroid disease  All other ROS reviewed and negative.     Physical Exam/Data:   Vitals:   08/20/20 1215 08/20/20 1345 08/20/20 1400 08/20/20 1415  BP: (!) 148/80 (!) 154/82 139/86 (!) 154/86  Pulse: 82 95 88 80  Resp: (!) 25 (!) 25 18 (!) 23  SpO2: 98% 99% 96% 100%   No intake or output data in the 24 hours ending 08/20/20 1453 Last 3  Weights 05/27/2020 11/26/2019  Weight (lbs) 172 lb 6.4 oz 151 lb 1.3 oz  Weight (kg) 78.2 kg 68.529 kg     There is no height or weight on file to calculate BMI.  General:  Well nourished, well developed, in  acute distress with pain HEENT: normal Lymph: no adenopathy Neck: no JVD Endocrine:  No thryomegaly Vascular: No carotid bruits; pedal pulses 2+ bilaterally  Cardiac:  normal S1, S2; RRR; no murmur gallup rub or click Lungs:  clear to auscultation bilaterally, no wheezing, rhonchi or rales  Abd: soft, ++tenderness across upper abd and no pain in chest to palpation, no hepatomegaly  Ext: no lower ext edema Musculoskeletal:  No deformities, BUE and BLE strength normal and equal Skin: warm and dry  Neuro:  Alert and oriented X 3 MAE follows commands, no focal abnormalities noted Psych:  Normal affect    Relevant CV Studies: None Echo pending  Laboratory Data:  High Sensitivity Troponin:   Recent Labs  Lab 08/20/20 1027  TROPONINIHS 1,534*      Chemistry Recent Labs  Lab 08/20/20 1040  NA 135  K 3.9  CL 101  GLUCOSE 334*  BUN 14  CREATININE 0.60    Recent Labs  Lab 08/20/20 1223  PROT 6.1*  ALBUMIN 3.4*  AST 23  ALT 23  ALKPHOS 72  BILITOT 0.9   Hematology Recent Labs  Lab 08/20/20 1027 08/20/20 1040  WBC 12.8*  --   RBC 4.29  --   HGB 13.2 13.6  HCT 38.3 40.0  MCV 89.3  --   MCH 30.8  --   MCHC 34.5  --   RDW 13.8  --   PLT 408*  --    BNPNo results for input(s):  BNP, PROBNP in the last 168 hours.  DDimer No results for input(s): DDIMER in the last 168 hours.   Radiology/Studies:  CT Angio Chest/Abd/Pel for Dissection W and/or Wo Contrast  Result Date: 08/20/2020 CLINICAL DATA:  Severe epigastric pain, abdominal pain. History of left breast cancer treated with lumpectomy, chemotherapy, and radiation in September of 2019 EXAM: CT ANGIOGRAPHY CHEST, ABDOMEN AND PELVIS TECHNIQUE: Non-contrast CT of the chest was initially obtained. Multidetector CT imaging through the chest, abdomen and pelvis was performed using the standard protocol during bolus administration of intravenous contrast. Multiplanar reconstructed images and MIPs were obtained and reviewed to evaluate the vascular anatomy. CONTRAST:  161mL OMNIPAQUE IOHEXOL 350 MG/ML SOLN COMPARISON:  None. FINDINGS: CTA CHEST FINDINGS Cardiovascular: Initial noncontrast imaging of the chest demonstrates no evidence for aortic intramural hematoma. Arterial phase postcontrast imaging demonstrates no evidence of aortic aneurysm or dissection. Four vessel arch with the left vertebral artery arising directly from the arch, an anatomic variant. There is scattered atherosclerotic calcification of the aorta. There is an 11 x 5 mm area of noncalcified atherosclerotic plaque with a slightly polypoid configuration projecting into the lumen of the distal descending thoracic aorta (series 12, image 93). Central pulmonary vasculature is within normal limits. No central filling defect. Normal heart size. No pericardial effusion. Mediastinum/Nodes: No axillary, mediastinal, or hilar lymphadenopathy. Thyroid and trachea within normal limits. Small sliding-type hiatal hernia. Esophagus otherwise within normal limits. Lungs/Pleura: No focal airspace consolidation. No suspicious pulmonary nodules or masses. No pleural effusion or pneumothorax. Musculoskeletal: There is a somewhat mottled appearance of the left third rib anteriorly (series  7, image 34) with some scarring in the adjacent lung, potentially representing post radiation changes. Osseous structures of the bony thorax appear otherwise  within normal limits without suspicious bony lesion. Skin thickening of the left breast with irregularly marginated density within the inner aspect of the breast abutting the pectoralis musculature measuring approximately 2.3 x 1.8 x 2.2 cm (series 7, image 44; series 12, image 116), which may represent patient's lumpectomy site. Review of the MIP images confirms the above findings. CTA ABDOMEN AND PELVIS FINDINGS VASCULAR Aorta: Normal caliber aorta without aneurysm, dissection, vasculitis or significant stenosis. Mild calcified and noncalcified atherosclerotic plaque. Celiac: Patent without evidence of aneurysm, dissection, vasculitis or significant stenosis. SMA: Patent without evidence of aneurysm, dissection, vasculitis or significant stenosis. Renals: Both renal arteries are patent without evidence of aneurysm, dissection, vasculitis, fibromuscular dysplasia or significant stenosis. IMA: Patent. Inflow: Patent without evidence of aneurysm, dissection, vasculitis or significant stenosis. Mild calcified and noncalcified atherosclerotic plaque. Veins: No obvious venous abnormality within the limitations of this arterial phase study. Review of the MIP images confirms the above findings. NON-VASCULAR Hepatobiliary: Diffusely decreased attenuation of the hepatic parenchyma. No focal hepatic lesion is identified on arterial phase imaging. Status post cholecystectomy. No biliary dilatation. Pancreas: There is peripancreatic fat stranding around the pancreatic head and proximal body (series 7, image 93). No pancreatic ductal dilatation. No evidence of parenchymal non enhancement. No peripancreatic fluid collection. Spleen: 1.8 x 0.9 cm rounded area of hypoattenuation within the anterior aspect of the spleen (series 7, image 84; series 12, image 160) measuring  greater than fluid density. Spleen is otherwise within normal limits. There are 2 small splenules in the region of the splenic hilum. Adrenals/Urinary Tract: No adrenal nodules or masses. There are wedge-shaped areas of decreased enhancement within the inferior pole of the right kidney (series 10, images 117 and 121). Possible tiny area of hypoattenuating cortex in the inferior pole of the left kidney (series 10, image 119). Kidneys have otherwise symmetric enhancement. No enhancing renal mass, stone, or hydronephrosis. Urinary bladder unremarkable. Stomach/Bowel: Small hiatal hernia. Stomach is otherwise within normal limits. Appendix appears normal (series 7, image 159). No evidence of bowel wall thickening, distention, or inflammatory changes. Moderate volume stool within the colon. Lymphatic: No abdominopelvic lymphadenopathy. Reproductive: Uterus and bilateral adnexa are unremarkable. Other: No free fluid. No abdominopelvic fluid collection. No pneumoperitoneum. No abdominal wall hernia. Musculoskeletal: No acute or significant osseous findings. Review of the MIP images confirms the above findings. IMPRESSION: Vascular findings: 1. Negative for aortic aneurysm or dissection. 2. Small wedge-shaped areas of decreased enhancement within the inferior poles of the right kidney and left kidney, concerning for areas of focal renal infarction. Pyelonephritis could have a similar appearance. Correlation with urinalysis is recommended. 3. Focal 11 x 5 mm area of noncalcified atherosclerotic plaque with a slightly polypoid configuration projecting into the lumen of the distal descending thoracic aorta. This may be at risk for potential plaque thrombosis. Nonvascular findings: 1. Findings of acute uncomplicated pancreatitis. No evidence of pancreatic necrosis or peripancreatic fluid collection. Correlate with serum lipase. 2. Findings suggestive of hepatic steatosis. 3. Indeterminate 1.8 cm rounded area of hypoattenuation  within the anterior aspect of the spleen. Further evaluation with contrast-enhanced MRI can be performed on a nonemergent basis. 4. Slightly mottled appearance of the left third rib anteriorly with some adjacent scarring in the adjacent lung, potentially representing post radiation changes. 5. Left breast skin thickening with suspected lumpectomy site at the inner left breast. Continued mammographic follow-up is recommended. Electronically Signed   By: Davina Poke D.O.   On: 08/20/2020 11:40     Assessment and Plan:  1. Acute epigastric pain, her troponin is elevated and her lipase is mildly elevated though on CT of chest abd, + acute pancreatitis.  Second hs troponin is about the same, flat though elevated, no acute ST elevation.  Has had fentanyl, morphine and dilaudid, and trying NTG now.   Will order echo as well.  Dr. Debara Pickett to see.  Begin IV heparin for now , we will consult and follow  2. Hx of MI in [place] 4 years ago no stent but did have cath.  Elevated troponin.  + risk for CAD, DM-2, premature FH CAD in her mother, + tobacco use and has had radiation for breast cancer. 3. Hx of DVT and eliquis stopped 05/2020 after 2 years of taking felt to be due to central line 4. DM-2 she states has been stable after trulicity started  (side effect is pancreatitis) now on steroids as well.  5. HLD on Tricor will check lipids  6. Bipolar disorder with allergy to lithium has been stable.  7. Tobacco use, may need nicoderm patch       TIMI Risk Score for Unstable Angina or Non-ST Elevation MI:   The patient's TIMI risk score is 4, which indicates a 20% risk of all cause mortality, new or recurrent myocardial infarction or need for urgent revascularization in the next 14 days.     For questions or updates, please contact [place] Please consult [email or web address] for contact info under     Signed, Cecilie Kicks, NP  08/20/2020 2:53 PM

## 2020-08-20 NOTE — Progress Notes (Signed)
ANTICOAGULATION CONSULT NOTE - Initial Consult  Pharmacy Consult for heparin gtt Indication: chest pain/ACS  Allergies  Allergen Reactions  . Lithium Other (See Comments)    Spinal fluid built up in brain  . Penicillins Other (See Comments)    UNKNOWN CHILDHOOD REACTION    Patient Measurements: Height: 5\' 2"  (157.5 cm) Weight: 78.2 kg (172 lb 6.4 oz) IBW/kg (Calculated) : 50.1 Heparin Dosing Weight: 67.3kg  Vital Signs: BP: 154/86 (10/27 1415) Pulse Rate: 80 (10/27 1415)  Labs: Recent Labs    08/20/20 1027 08/20/20 1040 08/20/20 1332  HGB 13.2 13.6  --   HCT 38.3 40.0  --   PLT 408*  --   --   LABPROT 13.2  --   --   INR 1.0  --   --   CREATININE  --  0.60  --   TROPONINIHS 1,534*  --  1,529*    Estimated Creatinine Clearance: 80.5 mL/min (by C-G formula based on SCr of 0.6 mg/dL).   Medical History: Past Medical History:  Diagnosis Date  . MI (myocardial infarction) ([place])    was in [place]  . Personal history of chemotherapy   . Personal history of radiation therapy    Assessment: 54 year old female presenting with epigastric pain that awoke her from sleep. Per cardiology, pt was on eliquis x2 years for history of RLE DVT and thrombus of central line. Pt stopped taking eliquis in August (fill history correlates with this. LF #30 in Aug 2021). Pharmacy now consulted to dose heparin for ACS concerns.   CBC WNL; Hgb 13.6; Plt 408   Goal of Therapy:  Heparin level 0.3-0.7 units/ml Monitor platelets by anticoagulation protocol: Yes   Plan:  Heparin 4000 units bolus x1 Heparin 800 units/hr infusion 6hr HL 2200  Monitor CBC, HL, clinical progress, and s/sx bleeding    Carolin Guernsey  PGY1 Pharmacy resident 08/20/2020,3:11 PM

## 2020-08-20 NOTE — ED Notes (Signed)
States went to urgent care in [facility] 2 days ago "thinking I had a heart attack" "they said it was a strained right shoulder" Hx MI 4 yrs ago - treated in [place]

## 2020-08-20 NOTE — H&P (Addendum)
History and Physical    Ariana Herrera FGH:[id number] DOB: January 31, 1966 DOA: 08/20/2020  PCP: Dulce Sellar, MD Consultants:  Marin Olp - oncology Patient coming from:  Home - lives with fiance; NOK: Tansy, [place], [phone]   Chief Complaint: Abdominal pain  HPI: Ariana Herrera is a 54 y.o. female with medical history significant of CAD and breast cancer treated with chemo/rads and with residual lymphedema presenting with abdominal pain.  She reports she was awakened overnight with severe abdominal pain.  It got worse and worse for about 2 hours and her boyfriend called 911.  +n/v.  She felt ok yesterday with mild cramping.  She is still having severe substernal pain that radiates into her back.  Nothing makes it worse, better with pain meds but it is short-lived.  No h/o similar prior.  She is very nauseated.  She did have an MI in the past (maybe 4 years ago) - presented with chest discomfort, told it might be GERD and they discharged her.  About 1-2 days later she went back and they said a stent was not needed but it was a heart attack.    Of note, she was seen at Urgent Care 2 days ago with R sided shoulder pain and chest pain.  She took NTG x 2 for this due to concern that it was a heart attack.  She reports being told that it was a shoulder strain for which she was given prednisone.  She took the 60 mg prednisone yesterday for the first time and symptoms developed overnight.  Her only other pain medication was Norco, which she takes regularly for pain.  Denies NSAIDs use.   ED Course:  Cardiology has seen, troponin 1500+, giving heparin for possible NSTEMI.  Cardiology thinks more concerning for pancreatitis based on history.  Treated with morphine.  CTA for possible dissection.  Pancreatis on CT with lipase of 148.    Review of Systems: As per HPI; otherwise review of systems reviewed and negative.   Ambulatory Status:  Ambulates without assistance  COVID Vaccine Status:  Complete  Past Medical History:  Diagnosis Date  . Asthma   . Bipolar disease, chronic ([place])   . Breast cancer in female [facility])   . MI (myocardial infarction) ([place])    was in [place]  . Personal history of chemotherapy   . Personal history of radiation therapy     Past Surgical History:  Procedure Laterality Date  . BREAST LUMPECTOMY Left 03/2019  . IR REMOVAL TUN ACCESS W/ PORT W/O FL MOD SED  12/20/2019    Social History   Socioeconomic History  . Marital status: Unknown    Spouse name: Not on file  . Number of children: Not on file  . Years of education: Not on file  . Highest education level: Not on file  Occupational History  . Occupation: disabled due to neuropathy and cancer treatments  Tobacco Use  . Smoking status: Current Every Day Smoker    Packs/day: 2.00    Years: 36.00    Pack years: 72.00    Types: Cigarettes    Start date: 11/26/2019  . Smokeless tobacco: Never Used  Vaping Use  . Vaping Use: Never used  Substance and Sexual Activity  . Alcohol use: Never  . Drug use: Yes    Types: Marijuana    Comment: occasionally  . Sexual activity: Not on file  Other Topics Concern  . Not on file  Social History Narrative  . Not on  file   Social Determinants of Health   Financial Resource Strain:   . Difficulty of Paying Living Expenses: Not on file  Food Insecurity:   . Worried About Charity fundraiser in the Last Year: Not on file  . Ran Out of Food in the Last Year: Not on file  Transportation Needs:   . Lack of Transportation (Medical): Not on file  . Lack of Transportation (Non-Medical): Not on file  Physical Activity:   . Days of Exercise per Week: Not on file  . Minutes of Exercise per Session: Not on file  Stress:   . Feeling of Stress : Not on file  Social Connections:   . Frequency of Communication with Friends and Family: Not on file  . Frequency of Social Gatherings with Friends and Family: Not on file  . Attends Religious Services:  Not on file  . Active Member of Clubs or Organizations: Not on file  . Attends Archivist Meetings: Not on file  . Marital Status: Not on file  Intimate Partner Violence:   . Fear of Current or Ex-Partner: Not on file  . Emotionally Abused: Not on file  . Physically Abused: Not on file  . Sexually Abused: Not on file    Allergies  Allergen Reactions  . Lithium Other (See Comments)    Spinal fluid built up in brain  . Penicillins Other (See Comments)    UNKNOWN CHILDHOOD REACTION    Family History  Problem Relation Age of Onset  . Heart attack Mother 67    Prior to Admission medications   Medication Sig Start Date End Date Taking? Authorizing Provider  Dulaglutide (TRULICITY) 0.01 VC/9.4WH SOPN Inject 0.75 mg into the skin every Tuesday.   Yes [provider]  fenofibrate (TRICOR) 145 MG tablet Take 145 mg by mouth daily.   Yes [provider]  HYDROcodone-acetaminophen (NORCO/VICODIN) 5-325 MG tablet Take 1 tablet by mouth in the morning, at noon, and at bedtime.  11/26/19  Yes [provider]  insulin glargine (LANTUS) 100 UNIT/ML injection Inject 25 Units into the skin at bedtime.   Yes [provider]  linaclotide (LINZESS) 290 MCG CAPS capsule Take 290 mcg by mouth daily as needed.   Yes [provider]  metFORMIN (GLUCOPHAGE) 1000 MG tablet Take 1,000 mg by mouth 2 (two) times daily with a meal. 11/26/19  Yes [provider]  predniSONE (DELTASONE) 10 MG tablet Take by mouth as directed. 6 day supply 08/18/20  Yes [provider]  QUEtiapine (SEROQUEL) 400 MG tablet Take 800 mg by mouth at bedtime.   Yes [provider]    Physical Exam: Vitals:   08/20/20 1345 08/20/20 1400 08/20/20 1415 08/20/20 1500  BP: (!) 154/82 139/86 (!) 154/86   Pulse: 95 88 80   Resp: (!) 25 18 (!) 23   SpO2: 99% 96% 100%   Weight:    78.2 kg  Height:    5\' 2"  (1.575 m)     . General:  Appears uncomfortable and  emotional . Eyes:  PERRL, EOMI, normal lids, iris . ENT:  grossly normal hearing, lips & tongue, mmm . Neck:  no LAD, masses or thyromegaly . Cardiovascular:  RRR, no m/r/g. No LE edema.  Marland Kitchen Respiratory:   CTA bilaterally with no wheezes/rales/rhonchi.  Normal respiratory effort. . Abdomen:  Significant mid-epigastric TTP with voluntary guarding . Skin:  no rash or induration seen on limited exam . Musculoskeletal:  grossly normal tone  BUE/BLE, good ROM, no bony abnormality . Psychiatric:  Emotionally labile/hystrionic mood and affect, speech fluent and appropriate, AOx3 . Neurologic:  CN 2-12 grossly intact, moves all extremities in coordinated fashion    Radiological Exams on Admission: Independently reviewed - see discussion in A/P where applicable  DG Chest Portable 1 View  Result Date: 08/20/2020 CLINICAL DATA:  Chest pain EXAM: PORTABLE CHEST 1 VIEW COMPARISON:  Same-day CT FINDINGS: The heart size and mediastinal contours are within normal limits. No focal airspace consolidation, pleural effusion, or pneumothorax. The visualized skeletal structures are unremarkable. IMPRESSION: No active disease. Electronically Signed   By: Davina Poke D.O.   On: 08/20/2020 14:55   CT Angio Chest/Abd/Pel for Dissection W and/or Wo Contrast  Result Date: 08/20/2020 CLINICAL DATA:  Severe epigastric pain, abdominal pain. History of left breast cancer treated with lumpectomy, chemotherapy, and radiation in September of 2019 EXAM: CT ANGIOGRAPHY CHEST, ABDOMEN AND PELVIS TECHNIQUE: Non-contrast CT of the chest was initially obtained. Multidetector CT imaging through the chest, abdomen and pelvis was performed using the standard protocol during bolus administration of intravenous contrast. Multiplanar reconstructed images and MIPs were obtained and reviewed to evaluate the vascular anatomy. CONTRAST:  166mL OMNIPAQUE IOHEXOL 350 MG/ML SOLN COMPARISON:  None. FINDINGS: CTA CHEST FINDINGS Cardiovascular:  Initial noncontrast imaging of the chest demonstrates no evidence for aortic intramural hematoma. Arterial phase postcontrast imaging demonstrates no evidence of aortic aneurysm or dissection. Four vessel arch with the left vertebral artery arising directly from the arch, an anatomic variant. There is scattered atherosclerotic calcification of the aorta. There is an 11 x 5 mm area of noncalcified atherosclerotic plaque with a slightly polypoid configuration projecting into the lumen of the distal descending thoracic aorta (series 12, image 93). Central pulmonary vasculature is within normal limits. No central filling defect. Normal heart size. No pericardial effusion. Mediastinum/Nodes: No axillary, mediastinal, or hilar lymphadenopathy. Thyroid and trachea within normal limits. Small sliding-type hiatal hernia. Esophagus otherwise within normal limits. Lungs/Pleura: No focal airspace consolidation. No suspicious pulmonary nodules or masses. No pleural effusion or pneumothorax. Musculoskeletal: There is a somewhat mottled appearance of the left third rib anteriorly (series 7, image 34) with some scarring in the adjacent lung, potentially representing post radiation changes. Osseous structures of the bony thorax appear otherwise within normal limits without suspicious bony lesion. Skin thickening of the left breast with irregularly marginated density within the inner aspect of the breast abutting the pectoralis musculature measuring approximately 2.3 x 1.8 x 2.2 cm (series 7, image 44; series 12, image 116), which may represent patient's lumpectomy site. Review of the MIP images confirms the above findings. CTA ABDOMEN AND PELVIS FINDINGS VASCULAR Aorta: Normal caliber aorta without aneurysm, dissection, vasculitis or significant stenosis. Mild calcified and noncalcified atherosclerotic plaque. Celiac: Patent without evidence of aneurysm, dissection, vasculitis or significant stenosis. SMA: Patent without evidence of  aneurysm, dissection, vasculitis or significant stenosis. Renals: Both renal arteries are patent without evidence of aneurysm, dissection, vasculitis, fibromuscular dysplasia or significant stenosis. IMA: Patent. Inflow: Patent without evidence of aneurysm, dissection, vasculitis or significant stenosis. Mild calcified and noncalcified atherosclerotic plaque. Veins: No obvious venous abnormality within the limitations of this arterial phase study. Review of the MIP images confirms the above findings. NON-VASCULAR Hepatobiliary: Diffusely decreased attenuation of the hepatic parenchyma. No focal hepatic lesion is identified on arterial phase imaging. Status post cholecystectomy. No biliary dilatation. Pancreas: There is peripancreatic fat stranding around the pancreatic head and proximal body (series 7, image 93). No pancreatic  ductal dilatation. No evidence of parenchymal non enhancement. No peripancreatic fluid collection. Spleen: 1.8 x 0.9 cm rounded area of hypoattenuation within the anterior aspect of the spleen (series 7, image 84; series 12, image 160) measuring greater than fluid density. Spleen is otherwise within normal limits. There are 2 small splenules in the region of the splenic hilum. Adrenals/Urinary Tract: No adrenal nodules or masses. There are wedge-shaped areas of decreased enhancement within the inferior pole of the right kidney (series 10, images 117 and 121). Possible tiny area of hypoattenuating cortex in the inferior pole of the left kidney (series 10, image 119). Kidneys have otherwise symmetric enhancement. No enhancing renal mass, stone, or hydronephrosis. Urinary bladder unremarkable. Stomach/Bowel: Small hiatal hernia. Stomach is otherwise within normal limits. Appendix appears normal (series 7, image 159). No evidence of bowel wall thickening, distention, or inflammatory changes. Moderate volume stool within the colon. Lymphatic: No abdominopelvic lymphadenopathy. Reproductive: Uterus  and bilateral adnexa are unremarkable. Other: No free fluid. No abdominopelvic fluid collection. No pneumoperitoneum. No abdominal wall hernia. Musculoskeletal: No acute or significant osseous findings. Review of the MIP images confirms the above findings. IMPRESSION: Vascular findings: 1. Negative for aortic aneurysm or dissection. 2. Small wedge-shaped areas of decreased enhancement within the inferior poles of the right kidney and left kidney, concerning for areas of focal renal infarction. Pyelonephritis could have a similar appearance. Correlation with urinalysis is recommended. 3. Focal 11 x 5 mm area of noncalcified atherosclerotic plaque with a slightly polypoid configuration projecting into the lumen of the distal descending thoracic aorta. This may be at risk for potential plaque thrombosis. Nonvascular findings: 1. Findings of acute uncomplicated pancreatitis. No evidence of pancreatic necrosis or peripancreatic fluid collection. Correlate with serum lipase. 2. Findings suggestive of hepatic steatosis. 3. Indeterminate 1.8 cm rounded area of hypoattenuation within the anterior aspect of the spleen. Further evaluation with contrast-enhanced MRI can be performed on a nonemergent basis. 4. Slightly mottled appearance of the left third rib anteriorly with some adjacent scarring in the adjacent lung, potentially representing post radiation changes. 5. Left breast skin thickening with suspected lumpectomy site at the inner left breast. Continued mammographic follow-up is recommended. Electronically Signed   By: Davina Poke D.O.   On: 08/20/2020 11:40    EKG: Independently reviewed.   1022 - NSR with rate 89; prolonged QTc 505; low voltage with no evidence of acute ischemia 1143 - NSR with rate 84; prolonged QTc 506; low voltage with no evidence of acute ischemia   Labs on Admission: I have personally reviewed the available labs and imaging studies at the time of the admission.  Pertinent labs:    Glucose 334 Mag++ 1.5 Lipase 148 HS troponin 1534, 1529, 1415 WBC 12.8 Platelets 408 INR 1.0 HCG 6.6 COVID/flu negative   Assessment/Plan Principal Problem:   NSTEMI (non-ST elevated myocardial infarction) (HCC) Active Problems:   Breast cancer (HCC)   Pancreatitis, acute   Bipolar disease, chronic (HCC)   Diabetes mellitus type 2 in obese (HCC)   Class 1 obesity due to excess calories with body mass index (BMI) of 31.0 to 31.9 in adult   NSTEMI -Patient with substernal chest pain and R shoulder pain that appeared to be similar to prior MI, occurring 2 days PTA -She was seen at Urgent Care and given prednisone for possible shoulder strain -She took 1 dose of 60 mg prednisone yesterday and developed extreme midepigastric/substernal CP overnight that is ongoing -Troponin is markedly elevated and c/w NSTEMI; it is now  downtrending -CXR unremarkable.   -EKG with no apparent STEMI. -HEART risk score is 5; indicating an elevated risk and need for cardiology evaluation on admission.  -Will admit since the patient has positive troponins and/or an abnormal EKG with angina necessitating acute intervention. -Repeat EKG in AM -Risk factor stratification with HgbA1c and FLP; will also check TSH and UDS -Cardiology consultation requested -Patient appears likely to need invasive evaluation (cardiac catheterization) based on concerning history, pertinent symptoms, elevated troponin  -Start Heparin drip as per cardiology -NTG for symptom relief (although there is no mortality benefit) -Needs beta blocker (PO, but only if not in HF or at risk for shock); given metoprolol -morphine given -Supplemental O2 -Start Lipitor 40 mg qhs for now and check FLP -Echo ordered per cardiology, will change to STAT -Nonspecific atherosclerotic changes on dissection study  Possible pancreatitis -Patient without prior h/o pancreatitis presenting with symptoms as above -History is not overly consistent  with pancreatitis by my review, more likely gastritis associated with prednisone IF not due to NSTEMI -Lipase is minimally elevated and not overly suggestive of pancreatitis -CT read as acute uncomplicated pancreatitis -Strict NPO for now -Treat with IVF hydration but not overly aggressive due to concern for NSTEMI as above (Echo is pending and if negative it may be reasonable to increase IVF rate) -Pain control with morphine 2 mg q2h prn.   -Nausea control with Zofran -The 4 most likely causes for pancreatitis include:             -Gallstones - will check RUQ [place]             -Alcohol - does not drink.             -Medications - Could be related to Trulicity, will hold                     -Hypertriglyceridemia -Will check a fasting lipid profile -Will start Protonix 40 mg IV BID -Consider Carafate but hold for now -Splenic lesion seen on CTA, consider f/u with MRI   HLD -Hold Tricor for now -Check Lipids -Start empiric Lipitor  DM -Glucose 334, likely elevated due to prednisone and stress -Check A1c  -Continue Lantus but decrease from 25 to 12 units qhs while NPO -Will cover with moderate-scale SSI for now  Bipolar d/o -Continue high-dose Seroquel  Chronic pain -I have reviewed this patient in the [place] Controlled Substances Reporting System.  She is receiving medications from only one provider and appears to be taking them as prescribed. -She is at high risk of opioid misuse or diversion, but not of overdose.  Breast cancer -Followed by Dr. Marin Olp -Treated in [place] recommended as an outpatient due to left breast skin thickening on CTA  Obesity Body mass index is 31.53 kg/m.  -Weight loss should be encouraged -Outpatient PCP/bariatric medicine f/u encouraged     Note: This patient has been tested and is negative for the novel coronavirus COVID-19. The patient has been fully vaccinated against COVID-19.     DVT prophylaxis: Heparin drip Code Status:  Full -  confirmed with patient Family Communication: None present Disposition Plan:  The patient is from: home  Anticipated d/c is to: home without [facility] services   Anticipated d/c date will depend on clinical response to treatment, but likely several days  Patient is currently: acutely ill Consults called: Cardiology  Admission status: Admit - It is my clinical opinion that admission to INPATIENT is reasonable and necessary  because of the expectation that this patient will require hospital care that crosses at least 2 midnights to treat this condition based on the medical complexity of the problems presented.  Given the aforementioned information, the predictability of an adverse outcome is felt to be significant.   Karmen Bongo MD [facility]   How to contact the [facility] Attending or Consulting provider [place] or covering provider during after hours [place], for this patient?  1. Check the care team in [facility] and look for a) attending/consulting [facility] provider listed and b) the [facility] team listed 2. Log into [email or web address] and use [place] universal password to access. If you do not have the password, please contact the hospital operator. 3. Locate the [facility] provider you are looking for under [facility] and page to a number that you can be directly reached. 4. If you still have difficulty reaching the provider, please page the [facility] (Director on Call) for the Hospitalists listed on amion for assistance.   08/20/2020, 4:53 PM

## 2020-08-20 NOTE — ED Provider Notes (Signed)
  Face-to-face evaluation   History: She reports onset of upper abdominal pain radiating to her back, several hours ago.  She thought she might be constipated.  She did not eat breakfast this morning because she was not hungry.  Physical exam: Alert, groaning, uncomfortable.  Abdomen is mildly distended.  The abdomen is moderately tender primarily in the upper mid region.  No palpable mass.  Hypoactive bowel sounds, with auscultation.  Moving arms and legs normally.   EKG Interpretation  Date/Time:  Wednesday August 20 2020 11:43:20 EDT Ventricular Rate:  84 PR Interval:    QRS Duration: 101 QT Interval:  428 QTC Calculation: 506 R Axis:   13 Text Interpretation: Sinus rhythm Borderline prolonged QT interval Since last tracing of earlier today No significant change was found Confirmed by Daleen Bo [phone]) on 08/20/2020 12:08:46 PM      2:02 PM-she continues to be uncomfortable and is moaning.  She states that she gets transient relief with pain medicine for the pain comes back lipase returned, mildly elevated.  Delta troponin, delayed, currently pending.   Medical screening examination/treatment/procedure(s) were conducted as a shared visit with non-physician practitioner(s) and myself.  I personally evaluated the patient during the encounter    Daleen Bo, MD 08/23/20 [phone]

## 2020-08-20 NOTE — ED Provider Notes (Signed)
[facility] EMERGENCY DEPARTMENT Provider Note   CSN: [id number] Arrival date & time: 08/20/20  1019     History Chief Complaint  Patient presents with  . Abdominal Pain  . Chest Pain    Ariana Herrera is a 54 y.o. 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  I obtained history from EMS reports that she received 4 mg morphine and 4 mg Zofran in route to the hospital.  Vital signs stable aside from mildly elevated blood pressure.  On my exam, patient states that she felt entirely fine yesterday and prior to going to bed.  She is now writhing in discomfort and complaining of significant pain symptoms.  I was grabbed a sooner she was wheeled in by EMS for evaluation given concern for emergent pathology.  Will place orders for i-STAT Chem-8 and dissection studies.  CT has already been called.   HPI     Past Medical History:  Diagnosis Date  . MI (myocardial infarction) ([place])    was in [place]  . Personal history of chemotherapy   . Personal history of radiation therapy     Patient Active Problem List   Diagnosis Date Noted  . Breast cancer ([place]) 11/26/2019  . 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 Deep vein thrombosis (DVT) of proximal vein of right lower extremity ([place]) 11/26/2019    Past Surgical History:  Procedure Laterality Date  . BREAST LUMPECTOMY Left 03/2019  . IR REMOVAL TUN ACCESS W/ PORT W/O FL MOD SED  12/20/2019     OB History   No obstetric history on file.     Family History  Problem Relation Age of Onset  . Heart attack Mother     Social History   Tobacco Use  . Smoking status: Current Every Day Smoker    Packs/day: 2.00    Types: Cigarettes    Start date: 11/26/2019  . Smokeless tobacco: Never Used   Vaping Use  . Vaping Use: Never used  Substance Use Topics  . Alcohol use: Never  . Drug use: Yes    Types: Marijuana    Home Medications Prior to Admission medications   Medication Sig Start Date End Date Taking? Authorizing Provider  Dulaglutide (TRULICITY) 6.29 BM/8.4XL SOPN Inject 0.75 mg into the skin every Tuesday.   Yes [provider]  fenofibrate (TRICOR) 145 MG tablet Take 145 mg by mouth daily.   Yes [provider]  HYDROcodone-acetaminophen (NORCO/VICODIN) 5-325 MG tablet Take 1 tablet by mouth in the morning, at noon, and at bedtime.  11/26/19  Yes [provider]  insulin glargine (LANTUS) 100 UNIT/ML injection Inject 25 Units into the skin at bedtime.   Yes [provider]  linaclotide (LINZESS) 290 MCG CAPS capsule Take 290 mcg by mouth daily as needed.   Yes [provider]  metFORMIN (GLUCOPHAGE) 1000 MG tablet Take 1,000 mg by mouth 2 (two) times daily with a meal. 11/26/19  Yes [provider]  predniSONE (DELTASONE) 10 MG tablet Take by mouth as directed. 6 day supply 08/18/20  Yes [provider]  QUEtiapine (SEROQUEL) 400 MG tablet Take 800 mg by mouth at bedtime.   Yes [provider]    Allergies    Lithium and Penicillins  Review of  Systems   Review of Systems  Unable to perform ROS: Acuity of condition    Physical Exam Updated Vital Signs BP (!) 154/86   Pulse 80   Resp (!) 23   Ht 5\' 2"  (1.575 m)   Wt 78.2 kg   SpO2 100%   BMI 31.53 kg/m   Physical Exam Vitals and nursing note reviewed. Exam conducted with a chaperone present.  Constitutional:      General: She is in acute distress.     Appearance: Normal appearance.  HENT:     Head: Normocephalic and atraumatic.  Eyes:     General: No scleral icterus.    Conjunctiva/sclera: Conjunctivae normal.  Cardiovascular:     Rate and Rhythm: Normal rate and regular rhythm.     Pulses: Normal pulses.     Heart sounds: Normal  heart sounds.     Comments: Radial and pedal pulses intact and symmetric bilaterally. Pulmonary:     Effort: Pulmonary effort is normal.  Abdominal:     Comments: Significant epigastric and periumbilical TTP.  No overlying skin changes.  No guarding.  No pulsatile mass.  Musculoskeletal:     Cervical back: Normal range of motion.  Skin:    General: Skin is dry.     Capillary Refill: Capillary refill takes less than 2 seconds.  Neurological:     General: No focal deficit present.     Mental Status: She is alert and oriented to person, place, and time.     GCS: GCS eye subscore is 4. GCS verbal subscore is 5. GCS motor subscore is 6.  Psychiatric:        Mood and Affect: Mood normal.        Behavior: Behavior normal.        Thought Content: Thought content normal.     ED Results / Procedures / Treatments   Labs (all labs ordered are listed, but only abnormal results are displayed) Labs Reviewed  CBC - Abnormal; Notable for the following components:      Result Value   WBC 12.8 (*)    Platelets 408 (*)    All other components within normal limits  HEPATIC FUNCTION PANEL - Abnormal; Notable for the following components:   Total Protein 6.1 (*)    Albumin 3.4 (*)    Bilirubin, Direct 0.3 (*)    All other components within normal limits  LIPASE, BLOOD - Abnormal; Notable for the following components:   Lipase 148 (*)    All other components within normal limits  MAGNESIUM - Abnormal; Notable for the following components:   Magnesium 1.5 (*)    All other components within normal limits  I-STAT BETA HCG BLOOD, ED ([facility], [facility], [facility] ONLY) - Abnormal; Notable for the following components:   I-stat hCG, quantitative 6.6 (*)    All other components within normal limits  I-STAT CHEM 8, ED - Abnormal; Notable for the following components:   Glucose, Bld 334 (*)    Calcium, Ion 1.11 (*)    TCO2 21 (*)    All other components within normal limits  TROPONIN I (HIGH SENSITIVITY) - Abnormal;  Notable for the following components:   Troponin I (High Sensitivity) 1,534 (*)    All other components within normal limits  TROPONIN I (HIGH SENSITIVITY) - Abnormal; Notable for the following components:   Troponin I (High Sensitivity) 1,529 (*)    All other components within normal limits  TROPONIN I (HIGH SENSITIVITY) - Abnormal; Notable for  the following components:   Troponin I (High Sensitivity) 1,415 (*)    All other components within normal limits  RESPIRATORY PANEL BY RT PCR (FLU A&B, COVID)  PROTIME-INR  URINALYSIS, ROUTINE W REFLEX MICROSCOPIC  T4, FREE  HEMOGLOBIN A1C  HEPARIN LEVEL (UNFRACTIONATED)  TSH  MAGNESIUM  LIPID PANEL  TYPE AND SCREEN  ABO/RH    EKG EKG Interpretation  Date/Time:  Wednesday August 20 2020 11:43:20 EDT Ventricular Rate:  84 PR Interval:    QRS Duration: 101 QT Interval:  428 QTC Calculation: 506 R Axis:   13 Text Interpretation: Sinus rhythm Borderline prolonged QT interval Since last tracing of earlier today No significant change was found Confirmed by Daleen Bo [phone]) on 08/20/2020 12:08:46 PM   Radiology DG Chest Portable 1 View  Result Date: 08/20/2020 CLINICAL DATA:  Chest pain EXAM: PORTABLE CHEST 1 VIEW COMPARISON:  Same-day CT FINDINGS: The heart size and mediastinal contours are within normal limits. No focal airspace consolidation, pleural effusion, or pneumothorax. The visualized skeletal structures are unremarkable. IMPRESSION: No active disease. Electronically Signed   By: Davina Poke D.O.   On: 08/20/2020 14:55   CT Angio Chest/Abd/Pel for Dissection W and/or Wo Contrast  Result Date: 08/20/2020 CLINICAL DATA:  Severe epigastric pain, abdominal pain. History of left breast cancer treated with lumpectomy, chemotherapy, and radiation in September of 2019 EXAM: CT ANGIOGRAPHY CHEST, ABDOMEN AND PELVIS TECHNIQUE: Non-contrast CT of the chest was initially obtained. Multidetector CT imaging through the chest,  abdomen and pelvis was performed using the standard protocol during bolus administration of intravenous contrast. Multiplanar reconstructed images and MIPs were obtained and reviewed to evaluate the vascular anatomy. CONTRAST:  146mL OMNIPAQUE IOHEXOL 350 MG/ML SOLN COMPARISON:  None. FINDINGS: CTA CHEST FINDINGS Cardiovascular: Initial noncontrast imaging of the chest demonstrates no evidence for aortic intramural hematoma. Arterial phase postcontrast imaging demonstrates no evidence of aortic aneurysm or dissection. Four vessel arch with the left vertebral artery arising directly from the arch, an anatomic variant. There is scattered atherosclerotic calcification of the aorta. There is an 11 x 5 mm area of noncalcified atherosclerotic plaque with a slightly polypoid configuration projecting into the lumen of the distal descending thoracic aorta (series 12, image 93). Central pulmonary vasculature is within normal limits. No central filling defect. Normal heart size. No pericardial effusion. Mediastinum/Nodes: No axillary, mediastinal, or hilar lymphadenopathy. Thyroid and trachea within normal limits. Small sliding-type hiatal hernia. Esophagus otherwise within normal limits. Lungs/Pleura: No focal airspace consolidation. No suspicious pulmonary nodules or masses. No pleural effusion or pneumothorax. Musculoskeletal: There is a somewhat mottled appearance of the left third rib anteriorly (series 7, image 34) with some scarring in the adjacent lung, potentially representing post radiation changes. Osseous structures of the bony thorax appear otherwise within normal limits without suspicious bony lesion. Skin thickening of the left breast with irregularly marginated density within the inner aspect of the breast abutting the pectoralis musculature measuring approximately 2.3 x 1.8 x 2.2 cm (series 7, image 44; series 12, image 116), which may represent patient's lumpectomy site. Review of the MIP images confirms the  above findings. CTA ABDOMEN AND PELVIS FINDINGS VASCULAR Aorta: Normal caliber aorta without aneurysm, dissection, vasculitis or significant stenosis. Mild calcified and noncalcified atherosclerotic plaque. Celiac: Patent without evidence of aneurysm, dissection, vasculitis or significant stenosis. SMA: Patent without evidence of aneurysm, dissection, vasculitis or significant stenosis. Renals: Both renal arteries are patent without evidence of aneurysm, dissection, vasculitis, fibromuscular dysplasia or significant stenosis. IMA: Patent. Inflow: Patent  without evidence of aneurysm, dissection, vasculitis or significant stenosis. Mild calcified and noncalcified atherosclerotic plaque. Veins: No obvious venous abnormality within the limitations of this arterial phase study. Review of the MIP images confirms the above findings. NON-VASCULAR Hepatobiliary: Diffusely decreased attenuation of the hepatic parenchyma. No focal hepatic lesion is identified on arterial phase imaging. Status post cholecystectomy. No biliary dilatation. Pancreas: There is peripancreatic fat stranding around the pancreatic head and proximal body (series 7, image 93). No pancreatic ductal dilatation. No evidence of parenchymal non enhancement. No peripancreatic fluid collection. Spleen: 1.8 x 0.9 cm rounded area of hypoattenuation within the anterior aspect of the spleen (series 7, image 84; series 12, image 160) measuring greater than fluid density. Spleen is otherwise within normal limits. There are 2 small splenules in the region of the splenic hilum. Adrenals/Urinary Tract: No adrenal nodules or masses. There are wedge-shaped areas of decreased enhancement within the inferior pole of the right kidney (series 10, images 117 and 121). Possible tiny area of hypoattenuating cortex in the inferior pole of the left kidney (series 10, image 119). Kidneys have otherwise symmetric enhancement. No enhancing renal mass, stone, or hydronephrosis.  Urinary bladder unremarkable. Stomach/Bowel: Small hiatal hernia. Stomach is otherwise within normal limits. Appendix appears normal (series 7, image 159). No evidence of bowel wall thickening, distention, or inflammatory changes. Moderate volume stool within the colon. Lymphatic: No abdominopelvic lymphadenopathy. Reproductive: Uterus and bilateral adnexa are unremarkable. Other: No free fluid. No abdominopelvic fluid collection. No pneumoperitoneum. No abdominal wall hernia. Musculoskeletal: No acute or significant osseous findings. Review of the MIP images confirms the above findings. IMPRESSION: Vascular findings: 1. Negative for aortic aneurysm or dissection. 2. Small wedge-shaped areas of decreased enhancement within the inferior poles of the right kidney and left kidney, concerning for areas of focal renal infarction. Pyelonephritis could have a similar appearance. Correlation with urinalysis is recommended. 3. Focal 11 x 5 mm area of noncalcified atherosclerotic plaque with a slightly polypoid configuration projecting into the lumen of the distal descending thoracic aorta. This may be at risk for potential plaque thrombosis. Nonvascular findings: 1. Findings of acute uncomplicated pancreatitis. No evidence of pancreatic necrosis or peripancreatic fluid collection. Correlate with serum lipase. 2. Findings suggestive of hepatic steatosis. 3. Indeterminate 1.8 cm rounded area of hypoattenuation within the anterior aspect of the spleen. Further evaluation with contrast-enhanced MRI can be performed on a nonemergent basis. 4. Slightly mottled appearance of the left third rib anteriorly with some adjacent scarring in the adjacent lung, potentially representing post radiation changes. 5. Left breast skin thickening with suspected lumpectomy site at the inner left breast. Continued mammographic follow-up is recommended. Electronically Signed   By: Davina Poke D.O.   On: 08/20/2020 11:40     Procedures .Critical Care Performed by: Corena Herter, PA-C Authorized by: Corena Herter, PA-C   Critical care provider statement:    Critical care time (minutes):  45   Critical care was necessary to treat or prevent imminent or life-threatening deterioration of the following conditions:  Cardiac failure   Critical care was time spent personally by me on the following activities:  Discussions with consultants, evaluation of patient's response to treatment, examination of patient, ordering and performing treatments and interventions, ordering and review of laboratory studies, ordering and review of radiographic studies, pulse oximetry, re-evaluation of patient's condition, obtaining history from patient or surrogate, review of old charts and blood draw for specimens Comments:     Questionable NSTEMI given troponin elevated at 1500+   (  including critical care time)  Medications Ordered in ED Medications  fentaNYL (SUBLIMAZE) 100 MCG/2ML injection (has no administration in time range)  nitroGLYCERIN (NITROSTAT) SL tablet 0.4 mg (0.4 mg Sublingual Given 08/20/20 1432)  nitroGLYCERIN (NITROSTAT) 0.4 MG SL tablet (has no administration in time range)  heparin ADULT infusion 100 units/mL (25000 units/272mL sodium chloride 0.45%) (800 Units/hr Intravenous New Bag/Given 08/20/20 1555)  fentaNYL (SUBLIMAZE) injection 100 mcg (100 mcg Intravenous Given 08/20/20 1031)  iohexol (OMNIPAQUE) 350 MG/ML injection 100 mL (100 mLs Intravenous Contrast Given 08/20/20 1101)  morphine 4 MG/ML injection 4 mg (4 mg Intravenous Given 08/20/20 1144)  aspirin chewable tablet 324 mg (324 mg Oral Given 08/20/20 1353)  HYDROmorphone (DILAUDID) injection 1 mg (1 mg Intravenous Given 08/20/20 1414)  heparin bolus via infusion 4,000 Units (4,000 Units Intravenous Bolus from Bag 08/20/20 1556)  sodium chloride 0.9 % bolus 500 mL (500 mLs Intravenous New Bag/Given 08/20/20 1553)  0.9 %  sodium chloride infusion  ( Intravenous New Bag/Given 08/20/20 1553)    ED Course  I have reviewed the triage vital signs and the nursing notes.  Pertinent labs & imaging results that were available during my care of the patient were reviewed by me and considered in my medical decision making (see chart for details).  Clinical Course as of Aug 20 1601  Wed Aug 20, 2020  1220 Spoke with cardiology who will come evaluate patient given her elevated troponin to 1534.   [GG]    Clinical Course User Index [GG] Corena Herter, PA-C   MDM Rules/Calculators/A&P                          On subsequent evaluation, patient reports that she takes sublingual nitroglycerin intermittently for chest pain.  Patient has a history of blood clots, but subsequent to surgery and she is no longer taking any anticoagulation.  She states that she had been having some weakness, chest pain, and dyspnea on exertion in the past week which prompted her to go to the urgent care a couple of days ago.  They had lower suspicion for ACS and according to her sent her home with a prednisone taper.  Patient endorses nausea and diaphoresis this morning in addition to her epigastric abdominal pain that radiates to her back.  PCP with [facility].  Labs CBC: Mild leukocytosis 12.8. I-stat chem 8: Hyperglycemia to 334.  Troponin: Elevated at 1534 >> 1529.   Hepatic function panel: Unremarkable. Lipase: 148 Respiratory panel by PCR: In process.  EKG demonstrates sinus rhythm but with mild prolonged QTC of 505.  CT dissection study is personally reviewed and demonstrates findings suggestive of acute uncomplicated pancreatitis.  S/p cholecystectomy.  Negative for aortic aneurysm or dissection.  There are also small wedge-shaped areas of decreased enhancement in the inferior pole of the kidneys concerning for renal infarction.  There is also an area of hypoattenuation within the anterior aspect of the spleen, recommending further evaluation with  contrast-enhanced MRI.  Spoke with cardiology who will come evaluate patient given her elevated troponin to 1534.  Per cardiology, they asked that we admit to hospitalist services as they suspect that her epigastric abdominal pain is more likely related to her pancreatitis.  However, they will continue to follow once admitted.  Starting her on heparin.    Patient is with [facility], will consult unassigned medicine.    Dr. Lorin Mercy, [facility], is consulted and will admit patient for  her acute uncomplicated pancreatitis.  Will continue working with cardiology regarding her elevated troponins.    Final Clinical Impression(s) / ED Diagnoses Final diagnoses:  Acute pancreatitis, unspecified complication status, unspecified pancreatitis type  Elevated troponin    Rx / DC Orders ED Discharge Orders    None       Corena Herter, PA-C 08/20/20 1602    Daleen Bo, MD 08/23/20 [phone]

## 2020-08-20 NOTE — Progress Notes (Addendum)
ANTICOAGULATION CONSULT NOTE - Follow Up Consult  Pharmacy Consult for heparin Indication: chest pain/ACS  Labs: Recent Labs    08/20/20 1027 08/20/20 1040 08/20/20 1332 08/20/20 1503 08/20/20 2149  HGB 13.2 13.6  --   --   --   HCT 38.3 40.0  --   --   --   PLT 408*  --   --   --   --   LABPROT 13.2  --   --   --   --   INR 1.0  --   --   --   --   HEPARINUNFRC  --   --   --   --  <0.10*  CREATININE  --  0.60  --   --   --   TROPONINIHS 1,534*  --  1,529* 1,415*  --     Assessment: 53yo female subtherapeutic on heparin with initial dosing for ACS; no gtt issues or signs of bleeding per RN.  Goal of Therapy:  Heparin level 0.3-0.7 units/ml   Plan:  Will rebolus with heparin 2000 units and increase heparin gtt by 4 units/kg/hr to 1100 units/hr and check level in 6 hours.    Wynona Neat, PharmD, BCPS  08/20/2020,11:37 PM   Addendum: Heparin level now at goal, 0.47.  Will continue at current rate and confirm stable with additional level.  VB 08/21/2020 6:43 AM

## 2020-08-20 NOTE — ED Triage Notes (Signed)
To ED via [facility] EMS from home, with c/o severe tearing type pain midsternal upper abd, radiating to back that woke her up at 9:30 this am. Pt is moaning with pain, holding onto chest and abd. Pale, cool, oriented x 4,  Received Morphine 4mg  + Zofran 4mg  IV enroute per EMS.  Feet cool, positive pedal pulses.

## 2020-08-21 ENCOUNTER — Inpatient Hospital Stay (HOSPITAL_COMMUNITY): Payer: Medicaid Other

## 2020-08-21 DIAGNOSIS — F319 Bipolar disorder, unspecified: Secondary | ICD-10-CM

## 2020-08-21 DIAGNOSIS — K8500 Idiopathic acute pancreatitis without necrosis or infection: Secondary | ICD-10-CM | POA: Diagnosis not present

## 2020-08-21 DIAGNOSIS — I214 Non-ST elevation (NSTEMI) myocardial infarction: Secondary | ICD-10-CM | POA: Diagnosis not present

## 2020-08-21 DIAGNOSIS — R079 Chest pain, unspecified: Secondary | ICD-10-CM

## 2020-08-21 LAB — ECHOCARDIOGRAM COMPLETE
Area-P 1/2: 3.21 cm2
Height: 62 in
S' Lateral: 2.1 cm
Weight: 2563.2 [oz_av]

## 2020-08-21 LAB — CBC
HCT: 35.7 % — ABNORMAL LOW (ref 36.0–46.0)
Hemoglobin: 12.2 g/dL (ref 12.0–15.0)
MCH: 30.3 pg (ref 26.0–34.0)
MCHC: 34.2 g/dL (ref 30.0–36.0)
MCV: 88.8 fL (ref 80.0–100.0)
Platelets: 327 K/uL (ref 150–400)
RBC: 4.02 MIL/uL (ref 3.87–5.11)
RDW: 13.9 % (ref 11.5–15.5)
WBC: 9.2 K/uL (ref 4.0–10.5)
nRBC: 0 % (ref 0.0–0.2)

## 2020-08-21 LAB — GLUCOSE, CAPILLARY
Glucose-Capillary: 102 mg/dL — ABNORMAL HIGH (ref 70–99)
Glucose-Capillary: 122 mg/dL — ABNORMAL HIGH (ref 70–99)
Glucose-Capillary: 224 mg/dL — ABNORMAL HIGH (ref 70–99)
Glucose-Capillary: 70 mg/dL (ref 70–99)
Glucose-Capillary: 76 mg/dL (ref 70–99)
Glucose-Capillary: 79 mg/dL (ref 70–99)

## 2020-08-21 LAB — BASIC METABOLIC PANEL
Anion gap: 6 (ref 5–15)
BUN: 9 mg/dL (ref 6–20)
CO2: 24 mmol/L (ref 22–32)
Calcium: 8.6 mg/dL — ABNORMAL LOW (ref 8.9–10.3)
Chloride: 106 mmol/L (ref 98–111)
Creatinine, Ser: 0.71 mg/dL (ref 0.44–1.00)
GFR, Estimated: >60 mL/min (ref >60)
Glucose, Bld: 103 mg/dL — ABNORMAL HIGH (ref 70–99)
Potassium: 3.6 mmol/L (ref 3.5–5.1)
Sodium: 136 mmol/L (ref 135–145)

## 2020-08-21 LAB — PROTIME-INR
INR: 1.1 (ref 0.8–1.2)
Prothrombin Time: 13.8 seconds (ref 11.4–15.2)

## 2020-08-21 LAB — LIPID PANEL
Cholesterol: 135 mg/dL (ref 0–200)
HDL: 29 mg/dL — ABNORMAL LOW (ref >40)
LDL Cholesterol: 61 mg/dL (ref 0–99)
Total CHOL/HDL Ratio: 4.7 RATIO
Triglycerides: 227 mg/dL — ABNORMAL HIGH (ref <150)
VLDL: 45 mg/dL — ABNORMAL HIGH (ref 0–40)

## 2020-08-21 LAB — HEPARIN LEVEL (UNFRACTIONATED)
Heparin Unfractionated: 0.47 [IU]/mL (ref 0.30–0.70)
Heparin Unfractionated: 0.22 [IU]/mL — ABNORMAL LOW (ref 0.30–0.70)

## 2020-08-21 LAB — HEMOGLOBIN A1C
Hgb A1c MFr Bld: 10.8 % — ABNORMAL HIGH (ref 4.8–5.6)
Mean Plasma Glucose: 263 mg/dL

## 2020-08-21 LAB — MAGNESIUM: Magnesium: 1.7 mg/dL (ref 1.7–2.4)

## 2020-08-21 MED ORDER — SODIUM CHLORIDE 0.9 % IV SOLN: 250.0000 mL | INTRAVENOUS | Status: DC | PRN

## 2020-08-21 MED ORDER — SODIUM CHLORIDE 0.9% FLUSH: 3.0000 mL | INTRAVENOUS | Status: DC | PRN

## 2020-08-21 MED ORDER — DILTIAZEM HCL 25 MG/5ML IV SOLN
INTRAVENOUS | Status: AC
  Administered 2020-08-21: 10 mg
  Filled 2020-08-21: qty 5

## 2020-08-21 MED ORDER — NITROGLYCERIN 0.4 MG SL SUBL
SUBLINGUAL_TABLET | SUBLINGUAL | Status: AC
  Administered 2020-08-21: 0.4 mg
  Filled 2020-08-21: qty 2

## 2020-08-21 MED ORDER — SODIUM CHLORIDE 0.9% FLUSH
3.0000 mL | Freq: Two times a day (BID) | INTRAVENOUS | Status: DC
  Administered 2020-08-22 – 2020-08-24 (×4): 3 mL via INTRAVENOUS

## 2020-08-21 MED ORDER — SODIUM CHLORIDE 0.9 % IV SOLN: INTRAVENOUS | Status: DC

## 2020-08-21 MED ORDER — METOPROLOL TARTRATE 50 MG PO TABS
50.0000 mg | ORAL_TABLET | ORAL | Status: AC
  Administered 2020-08-21: 50 mg via ORAL
  Filled 2020-08-21: qty 1

## 2020-08-21 MED ORDER — METOPROLOL TARTRATE 5 MG/5ML IV SOLN
INTRAVENOUS | Status: AC
  Administered 2020-08-21: 5 mg
  Filled 2020-08-21: qty 5

## 2020-08-21 NOTE — Progress Notes (Signed)
Echocardiogram 2D Echocardiogram has been performed.  [name] 08/21/2020, 8:55 AM

## 2020-08-21 NOTE — Progress Notes (Signed)
ANTICOAGULATION CONSULT NOTE - Initial Consult  Pharmacy Consult for heparin Indication: chest pain/ACS  Allergies  Allergen Reactions  . Lithium Other (See Comments)    Spinal fluid built up in brain  . Penicillins Other (See Comments)    UNKNOWN CHILDHOOD REACTION    Patient Measurements: Height: 5\' 2"  (157.5 cm) Weight: 72.7 kg (160 lb 3.2 oz) IBW/kg (Calculated) : 50.1 Heparin Dosing Weight: 67.3 kg  Vital Signs: Temp: 98.4 F (36.9 C) (10/28 1131) Temp Source: Oral (10/28 1131) BP: 106/61 (10/28 1131) Pulse Rate: 88 (10/28 1131)  Labs: Recent Labs    08/20/20 1027 08/20/20 1027 08/20/20 1040 08/20/20 1332 08/20/20 1503 08/20/20 2149 08/21/20 0558 08/21/20 1555  HGB 13.2   < > 13.6  --   --   --  12.2  --   HCT 38.3  --  40.0  --   --   --  35.7*  --   PLT 408*  --   --   --   --   --  327  --   LABPROT 13.2  --   --   --   --   --  13.8  --   INR 1.0  --   --   --   --   --  1.1  --   HEPARINUNFRC  --   --   --   --   --  <0.10* 0.47 0.22*  CREATININE  --   --  0.60  --   --   --  0.71  --   TROPONINIHS 1,534*  --   --  1,529* 1,415*  --   --   --    < > = values in this interval not displayed.    Estimated Creatinine Clearance: 77.6 mL/min (by C-G formula based on SCr of 0.71 mg/dL).   Medical History: Past Medical History:  Diagnosis Date  . Asthma   . Bipolar disease, chronic ([place])   . Breast cancer in female [facility])   . MI (myocardial infarction) ([place])    was in [place]  . Personal history of chemotherapy   . Personal history of radiation therapy     Assessment: 54 yo W with chest pain and elevated troponins, started on heparin infusion. No AC PTA.   Heparin level now subtherapeutic at 0.22. CBC wnl. No active bleed issues reported.  Goal of Therapy:  Heparin level 0.3-0.7 units/ml Monitor platelets by anticoagulation protocol: Yes   Plan: Increase heparin to 1250 units/hr Check 6hr heparin level Monitor daily heparin level and CBC,  s/sx bleeding   Arturo Morton, PharmD, BCPS Please check AMION for all [place] contact numbers Clinical Pharmacist 08/21/2020 5:05 PM

## 2020-08-21 NOTE — Progress Notes (Signed)
ANTICOAGULATION CONSULT NOTE - Initial Consult  Pharmacy Consult for heparin Indication: chest pain/ACS  Allergies  Allergen Reactions  . Lithium Other (See Comments)    Spinal fluid built up in brain  . Penicillins Other (See Comments)    UNKNOWN CHILDHOOD REACTION    Patient Measurements: Height: 5\' 2"  (157.5 cm) Weight: 72.7 kg (160 lb 3.2 oz) IBW/kg (Calculated) : 50.1 Heparin Dosing Weight: 63 kg  Vital Signs: Temp: 98.9 F (37.2 C) (10/28 0800) Temp Source: Oral (10/28 0800) BP: 119/70 (10/28 0800) Pulse Rate: 86 (10/28 0800)  Labs: Recent Labs    08/20/20 1027 08/20/20 1027 08/20/20 1040 08/20/20 1332 08/20/20 1503 08/20/20 2149 08/21/20 0558  HGB 13.2   < > 13.6  --   --   --  12.2  HCT 38.3  --  40.0  --   --   --  35.7*  PLT 408*  --   --   --   --   --  327  LABPROT 13.2  --   --   --   --   --  13.8  INR 1.0  --   --   --   --   --  1.1  HEPARINUNFRC  --   --   --   --   --  <0.10* 0.47  CREATININE  --   --  0.60  --   --   --  0.71  TROPONINIHS 1,534*  --   --  1,529* 1,415*  --   --    < > = values in this interval not displayed.    Estimated Creatinine Clearance: 77.6 mL/min (by C-G formula based on SCr of 0.71 mg/dL).   Medical History: Past Medical History:  Diagnosis Date  . Asthma   . Bipolar disease, chronic ([place])   . Breast cancer in female [facility])   . MI (myocardial infarction) ([place])    was in [place]  . Personal history of chemotherapy   . Personal history of radiation therapy     Assessment: 54 yo W with chest pain and elevated troponins, started on heparin infusion. No AC PTA.   HL 0.47 at goal on 1100 units/hr. H/H, plt stable given fluids and lab draws.   Goal of Therapy:  Heparin level 0.3-0.7 units/ml Monitor platelets by anticoagulation protocol: Yes   Plan: Continue heparin 1100 units/hr F/u 8hr confirmatory level Monitor daily HL, CBC/plt Monitor for signs/symptoms of bleeding    Benetta Spar, PharmD, BCPS,  BCCP Clinical Pharmacist  Please check AMION for all [place] phone numbers After 10:00 PM, call [place]

## 2020-08-21 NOTE — Progress Notes (Addendum)
Inpatient Diabetes Program Recommendations  AACE/ADA: New Consensus Statement on Inpatient Glycemic Control (2015)  Target Ranges:  Prepandial:   less than 140 mg/dL      Peak postprandial:   less than 180 mg/dL (1-2 hours)      Critically ill patients:  140 - 180 mg/dL   Lab Results  Component Value Date   GLUCAP 76 08/21/2020   HGBA1C 10.8 (H) 08/20/2020    Review of Glycemic Control Results for Ariana Herrera, Ariana Herrera (MRN [id number]) as of 08/21/2020 13:45  Ref. Range 08/21/2020 00:07 08/21/2020 04:28 08/21/2020 08:36 08/21/2020 11:34  Glucose-Capillary Latest Ref Range: 70 - 99 mg/dL 224 (H) 122 (H) 102 (H) 76   Diabetes history: Type 2 DM Outpatient Diabetes medications: Trulicity 1.32 mg Qwk, Lantus 25 units QD, Metformin 1000 mg BID Current orders for Inpatient glycemic control: Lantus 12 units QHS, Novolog 0-15 units TID  Inpatient Diabetes Program Recommendations:    Noted potential for pancreatitis, would be guarded with resuming Truliclity at discharge.   Spoke with patient regarding outpatient diabetes management. Patient has been on Prednisone outpatient and reports CBG range from 100-450's mg/dL. Reviewed patient's current A1c of 10.8%. Explained what a A1c is and what it measures. Also reviewed goal A1c with patient, importance of good glucose control @ home, and blood sugar goals. Reviewed patho of DM, need for insulin, role of pancreas, impact of steroids, current inpatient glucose trends, vascular changes, impact from cardiac perspective, differences between long acting vs short acting insulin, importance of evaluating post prandial glucose, and other commorbidities.  Patient has a meter and uses it 3 times per day. Reviewed recommended frequency of CBG checks, and stressed the importance of reaching out to PCP with values exceeding 180 mg/dL. Encouraged to check post prandials while on steroids.  Admitting to consuming sugary beverages. Reviewed alternatives, carb counting,  reading nutritional labels and the importance of mindful of CHO intake. Patient has no further questions at this time and plans to follow up with PCP.   Thanks, Bronson Curb, MSN, RNC-OB Diabetes Coordinator [phone] (8a-5p)

## 2020-08-21 NOTE — Progress Notes (Signed)
PROGRESS NOTE    Ariana Herrera  GMW:[id number] DOB: May 31, 1966 DOA: 08/20/2020 PCP: Dulce Sellar, MD   Brief Narrative:  HPI on 08/20/2020 by Dr. Karmen Bongo Ariana Herrera is a 54 y.o. female with medical history significant of CAD and breast cancer treated with chemo/rads and with residual lymphedema presenting with abdominal pain.  She reports she was awakened overnight with severe abdominal pain.  It got worse and worse for about 2 hours and her boyfriend called 911.  +n/v.  She felt ok yesterday with mild cramping.  She is still having severe substernal pain that radiates into her back.  Nothing makes it worse, better with pain meds but it is short-lived.  No h/o similar prior.  She is very nauseated.  She did have an MI in the past (maybe 4 years ago) - presented with chest discomfort, told it might be GERD and they discharged her.  About 1-2 days later she went back and they said a stent was not needed but it was a heart attack.    Of note, she was seen at Urgent Care 2 days ago with R sided shoulder pain and chest pain.  She took NTG x 2 for this due to concern that it was a heart attack.  She reports being told that it was a shoulder strain for which she was given prednisone.  She took the 60 mg prednisone yesterday for the first time and symptoms developed overnight.  Her only other pain medication was Norco, which she takes regularly for pain.  Denies NSAIDs use.  Interim history  Assessment & Plan   NSTEMI -Patient presented with substernal chest pain along with right shoulder pain that she found was similar to her prior MI -She presented to urgent care prior to admission and was given prednisone for possible shoulder strain -High-sensitivity troponin IV, and trending downward to 1415 -Cardiology consulted and appreciated -Initially recommended placing the patient on heparin, obtaining echocardiogram and possible need for cardiac catheterization -Echocardiogram  pending -Today, cardiology recommending cardiac CT -Risk ratification including hemoglobin A1c was 10.8, lipid panel showed total cholesterol 170, HDL 38, LDL 101, triglycerides 153  Possible pancreatitis/epigastric pain -Upon admission, lipase 148 -RUQ ultrasound: Post cholecystectomy without biliary dilatation.  Heterogeneous hepatic parenchyma which is difficult to penetrate, typical steatosis -Patient denies any alcohol use.  She has been on Trulicity which has been held.  Her triglycerides minimally elevated at 153 -At this point if this is truly pancreatitis, will continue n.p.o. along with IV fluids and pain control along with antiemetics  Hyperlipidemia -Lipid panel as above -Continue statin -TriCor held  Diabetes mellitus, type II -Hemoglobin A1c as above, 10.8 -Continue Lantus, insulin sliding scale and CBG monitoring  Bipolar disorder -Continue Seroquel  Chronic pain -Continue Tylenol, Dilaudid as needed  Breast cancer -Continue to follow-up with Dr. Marin Olp, oncology  Obesity -BMI of 29.3 -Patient to follow-up with PCP to discuss lifestyle modifications  Splenic lesion -Incidentally seen on CTA -Patient may need outpatient MRI for follow-up  DVT Prophylaxis Heparin  Code Status: Full  Family Communication: None at bedside  Disposition Plan:  Status is: Inpatient  Remains inpatient appropriate because:Ongoing active pain requiring inpatient pain management and IV treatments appropriate due to intensity of illness or inability to take PO   Dispo: The patient is from: Home              Anticipated d/c is to: Home  Anticipated d/c date is: 2 days              Patient currently is not medically stable to d/c.   Consultants Cardiology  Procedures  RUQ ultrasound Echocardiogram  Antibiotics   Anti-infectives (From admission, onward)   None      Subjective:   Ariana Herrera seen and examined today.  Continues to have epigastric pain.   Denies current chest pain or shortness of breath, abdominal pain, nausea or vomiting, dizziness or headache.  Objective:   Vitals:   08/20/20 2346 08/21/20 0427 08/21/20 0800 08/21/20 1131  BP: 123/67 125/70 119/70 106/61  Pulse:  85 86 88  Resp: 16 16 16 20   Temp: 99.1 F (37.3 C) 98.7 F (37.1 C) 98.9 F (37.2 C) 98.4 F (36.9 C)  TempSrc: Oral Oral Oral Oral  SpO2: 99% 98% 96% 96%  Weight:  72.7 kg    Height:        Intake/Output Summary (Last 24 hours) at 08/21/2020 1359 Last data filed at 08/21/2020 1022 Gross per 24 hour  Intake 1359.39 ml  Output 1200 ml  Net 159.39 ml   Filed Weights   08/20/20 1500 08/21/20 0427  Weight: 78.2 kg 72.7 kg    Exam  General: Well developed, well nourished, NAD, appears stated age  20: NCAT,  mucous membranes moist.   Cardiovascular: S1 S2 auscultated, no murmurs, RRR  Respiratory: Clear to auscultation bilaterally  Abdomen: Soft, epigastric TTP, nondistended, + bowel sounds  Extremities: warm dry without cyanosis clubbing or edema  Neuro: AAOx3, nonfocal  Psych: Normal affect and demeanor with intact judgement and insight   Data Reviewed: I have personally reviewed following labs and imaging studies  CBC: Recent Labs  Lab 08/20/20 1027 08/20/20 1040 08/21/20 0558  WBC 12.8*  --  9.2  HGB 13.2 13.6 12.2  HCT 38.3 40.0 35.7*  MCV 89.3  --  88.8  PLT 408*  --  147   Basic Metabolic Panel: Recent Labs  Lab 08/20/20 1040 08/20/20 1503 08/20/20 1547 08/21/20 0558  NA 135  --   --  136  K 3.9  --   --  3.6  CL 101  --   --  106  CO2  --   --   --  24  GLUCOSE 334*  --   --  103*  BUN 14  --   --  9  CREATININE 0.60  --   --  0.71  CALCIUM  --   --   --  8.6*  MG  --  1.5* 1.3* 1.7   GFR: Estimated Creatinine Clearance: 77.6 mL/min (by C-G formula based on SCr of 0.71 mg/dL). Liver Function Tests: Recent Labs  Lab 08/20/20 1223  AST 23  ALT 23  ALKPHOS 72  BILITOT 0.9  PROT 6.1*  ALBUMIN  3.4*   Recent Labs  Lab 08/20/20 1223  LIPASE 148*   No results for input(s): AMMONIA in the last 168 hours. Coagulation Profile: Recent Labs  Lab 08/20/20 1027 08/21/20 0558  INR 1.0 1.1   Cardiac Enzymes: No results for input(s): CKTOTAL, CKMB, CKMBINDEX, TROPONINI in the last 168 hours. BNP (last 3 results) No results for input(s): PROBNP in the last 8760 hours. HbA1C: Recent Labs    08/20/20 1421  HGBA1C 10.8*   CBG: Recent Labs  Lab 08/20/20 2020 08/21/20 0007 08/21/20 0428 08/21/20 0836 08/21/20 1134  GLUCAP 240* 224* 122* 102* 76   Lipid Profile: Recent Labs  08/20/20 1547 08/21/20 0558  CHOL 170 135  HDL 38* 29*  LDLCALC 101* 61  TRIG 153* 227*  CHOLHDL 4.5 4.7   Thyroid Function Tests: Recent Labs    08/20/20 1547  TSH 1.011  FREET4 0.75   Anemia Panel: No results for input(s): VITAMINB12, FOLATE, FERRITIN, TIBC, IRON, RETICCTPCT in the last 72 hours. Urine analysis: No results found for: COLORURINE, APPEARANCEUR, LABSPEC, PHURINE, GLUCOSEU, HGBUR, BILIRUBINUR, KETONESUR, PROTEINUR, UROBILINOGEN, NITRITE, LEUKOCYTESUR Sepsis Labs: @LABRCNTIP (procalcitonin:4,lacticidven:4)  ) Recent Results (from the past 240 hour(s))  Respiratory Panel by RT PCR (Flu A&B, Covid) - Nasopharyngeal Swab     Status: None   Collection Time: 08/20/20 11:47 AM   Specimen: Nasopharyngeal Swab  Result Value Ref Range Status   SARS Coronavirus 2 by RT PCR NEGATIVE NEGATIVE Final    Comment: (NOTE) SARS-CoV-2 target nucleic acids are NOT DETECTED.  The SARS-CoV-2 RNA is generally detectable in upper respiratoy specimens during the acute phase of infection. The lowest concentration of SARS-CoV-2 viral copies this assay can detect is 131 copies/mL. A negative result does not preclude SARS-Cov-2 infection and should not be used as the sole basis for treatment or other patient management decisions. A negative result may occur with  improper specimen  collection/handling, submission of specimen other than nasopharyngeal swab, presence of viral mutation(s) within the areas targeted by this assay, and inadequate number of viral copies (<131 copies/mL). A negative result must be combined with clinical observations, patient history, and epidemiological information. The expected result is Negative.  Fact Sheet for Patients:  PinkCheek.be  Fact Sheet for Healthcare Providers:  GravelBags.it  This test is no t yet approved or cleared by the [place] FDA and  has been authorized for detection and/or diagnosis of SARS-CoV-2 by FDA under an Emergency Use Authorization (EUA). This EUA will remain  in effect (meaning this test can be used) for the duration of the COVID-19 declaration under Section 564(b)(1) of the Act, 21 U.S.C. section 360bbb-3(b)(1), unless the authorization is terminated or revoked sooner.     Influenza A by PCR NEGATIVE NEGATIVE Final   Influenza B by PCR NEGATIVE NEGATIVE Final    Comment: (NOTE) The Xpert Xpress SARS-CoV-2/FLU/RSV assay is intended as an aid in  the diagnosis of influenza from Nasopharyngeal swab specimens and  should not be used as a sole basis for treatment. Nasal washings and  aspirates are unacceptable for Xpert Xpress SARS-CoV-2/FLU/RSV  testing.  Fact Sheet for Patients: PinkCheek.be  Fact Sheet for Healthcare Providers: GravelBags.it  This test is not yet approved or cleared by the [place] FDA and  has been authorized for detection and/or diagnosis of SARS-CoV-2 by  FDA under an Emergency Use Authorization (EUA). This EUA will remain  in effect (meaning this test can be used) for the duration of the  Covid-19 declaration under Section 564(b)(1) of the Act, 21  U.S.C. section 360bbb-3(b)(1), unless the authorization is  terminated or revoked. Performed at [facility], [place] [address] [place], [place] [address]       Radiology Studies: DG Chest Portable 1 View  Result Date: 08/20/2020 CLINICAL DATA:  Chest pain EXAM: PORTABLE CHEST 1 VIEW COMPARISON:  Same-day CT FINDINGS: The heart size and mediastinal contours are within normal limits. No focal airspace consolidation, pleural effusion, or pneumothorax. The visualized skeletal structures are unremarkable. IMPRESSION: No active disease. Electronically Signed   By: Davina Poke D.O.   On: 08/20/2020 14:55   CT Angio Chest/Abd/Pel for Dissection  W and/or Wo Contrast  Result Date: 08/20/2020 CLINICAL DATA:  Severe epigastric pain, abdominal pain. History of left breast cancer treated with lumpectomy, chemotherapy, and radiation in September of 2019 EXAM: CT ANGIOGRAPHY CHEST, ABDOMEN AND PELVIS TECHNIQUE: Non-contrast CT of the chest was initially obtained. Multidetector CT imaging through the chest, abdomen and pelvis was performed using the standard protocol during bolus administration of intravenous contrast. Multiplanar reconstructed images and MIPs were obtained and reviewed to evaluate the vascular anatomy. CONTRAST:  130mL OMNIPAQUE IOHEXOL 350 MG/ML SOLN COMPARISON:  None. FINDINGS: CTA CHEST FINDINGS Cardiovascular: Initial noncontrast imaging of the chest demonstrates no evidence for aortic intramural hematoma. Arterial phase postcontrast imaging demonstrates no evidence of aortic aneurysm or dissection. Four vessel arch with the left vertebral artery arising directly from the arch, an anatomic variant. There is scattered atherosclerotic calcification of the aorta. There is an 11 x 5 mm area of noncalcified atherosclerotic plaque with a slightly polypoid configuration projecting into the lumen of the distal descending thoracic aorta (series 12, image 93). Central pulmonary vasculature is within normal limits. No central filling defect. Normal heart size. No pericardial effusion.  Mediastinum/Nodes: No axillary, mediastinal, or hilar lymphadenopathy. Thyroid and trachea within normal limits. Small sliding-type hiatal hernia. Esophagus otherwise within normal limits. Lungs/Pleura: No focal airspace consolidation. No suspicious pulmonary nodules or masses. No pleural effusion or pneumothorax. Musculoskeletal: There is a somewhat mottled appearance of the left third rib anteriorly (series 7, image 34) with some scarring in the adjacent lung, potentially representing post radiation changes. Osseous structures of the bony thorax appear otherwise within normal limits without suspicious bony lesion. Skin thickening of the left breast with irregularly marginated density within the inner aspect of the breast abutting the pectoralis musculature measuring approximately 2.3 x 1.8 x 2.2 cm (series 7, image 44; series 12, image 116), which may represent patient's lumpectomy site. Review of the MIP images confirms the above findings. CTA ABDOMEN AND PELVIS FINDINGS VASCULAR Aorta: Normal caliber aorta without aneurysm, dissection, vasculitis or significant stenosis. Mild calcified and noncalcified atherosclerotic plaque. Celiac: Patent without evidence of aneurysm, dissection, vasculitis or significant stenosis. SMA: Patent without evidence of aneurysm, dissection, vasculitis or significant stenosis. Renals: Both renal arteries are patent without evidence of aneurysm, dissection, vasculitis, fibromuscular dysplasia or significant stenosis. IMA: Patent. Inflow: Patent without evidence of aneurysm, dissection, vasculitis or significant stenosis. Mild calcified and noncalcified atherosclerotic plaque. Veins: No obvious venous abnormality within the limitations of this arterial phase study. Review of the MIP images confirms the above findings. NON-VASCULAR Hepatobiliary: Diffusely decreased attenuation of the hepatic parenchyma. No focal hepatic lesion is identified on arterial phase imaging. Status post  cholecystectomy. No biliary dilatation. Pancreas: There is peripancreatic fat stranding around the pancreatic head and proximal body (series 7, image 93). No pancreatic ductal dilatation. No evidence of parenchymal non enhancement. No peripancreatic fluid collection. Spleen: 1.8 x 0.9 cm rounded area of hypoattenuation within the anterior aspect of the spleen (series 7, image 84; series 12, image 160) measuring greater than fluid density. Spleen is otherwise within normal limits. There are 2 small splenules in the region of the splenic hilum. Adrenals/Urinary Tract: No adrenal nodules or masses. There are wedge-shaped areas of decreased enhancement within the inferior pole of the right kidney (series 10, images 117 and 121). Possible tiny area of hypoattenuating cortex in the inferior pole of the left kidney (series 10, image 119). Kidneys have otherwise symmetric enhancement. No enhancing renal mass, stone, or hydronephrosis. Urinary bladder unremarkable. Stomach/Bowel: Small  hiatal hernia. Stomach is otherwise within normal limits. Appendix appears normal (series 7, image 159). No evidence of bowel wall thickening, distention, or inflammatory changes. Moderate volume stool within the colon. Lymphatic: No abdominopelvic lymphadenopathy. Reproductive: Uterus and bilateral adnexa are unremarkable. Other: No free fluid. No abdominopelvic fluid collection. No pneumoperitoneum. No abdominal wall hernia. Musculoskeletal: No acute or significant osseous findings. Review of the MIP images confirms the above findings. IMPRESSION: Vascular findings: 1. Negative for aortic aneurysm or dissection. 2. Small wedge-shaped areas of decreased enhancement within the inferior poles of the right kidney and left kidney, concerning for areas of focal renal infarction. Pyelonephritis could have a similar appearance. Correlation with urinalysis is recommended. 3. Focal 11 x 5 mm area of noncalcified atherosclerotic plaque with a slightly  polypoid configuration projecting into the lumen of the distal descending thoracic aorta. This may be at risk for potential plaque thrombosis. Nonvascular findings: 1. Findings of acute uncomplicated pancreatitis. No evidence of pancreatic necrosis or peripancreatic fluid collection. Correlate with serum lipase. 2. Findings suggestive of hepatic steatosis. 3. Indeterminate 1.8 cm rounded area of hypoattenuation within the anterior aspect of the spleen. Further evaluation with contrast-enhanced MRI can be performed on a nonemergent basis. 4. Slightly mottled appearance of the left third rib anteriorly with some adjacent scarring in the adjacent lung, potentially representing post radiation changes. 5. Left breast skin thickening with suspected lumpectomy site at the inner left breast. Continued mammographic follow-up is recommended. Electronically Signed   By: Davina Poke D.O.   On: 08/20/2020 11:40   US Abdomen Limited RUQ (LIVER/GB)  Result Date: 08/20/2020 CLINICAL DATA:  Pancreatitis.  Right upper quadrant pain.  Nausea. EXAM: ULTRASOUND ABDOMEN LIMITED RIGHT UPPER QUADRANT COMPARISON:  Dissection protocol CT earlier today. FINDINGS: Gallbladder: Surgically absent. Common bile duct: Diameter: 4 mm, nondilated. Liver: Heterogeneously increased in parenchymal echogenicity. Liver parenchyma is difficult to penetrate. No focal abnormalities seen by ultrasound. Portal vein is patent on color Doppler imaging with normal direction of blood flow towards the liver. Other: No right upper quadrant ascites. IMPRESSION: 1. Post cholecystectomy without biliary dilatation. 2. Heterogeneous hepatic parenchyma which is difficult to penetrate, typical of steatosis. Electronically Signed   By: Keith Rake M.D.   On: 08/20/2020 19:46     Scheduled Meds: . aspirin EC  81 mg Oral Daily  . atorvastatin  40 mg Oral Daily  . insulin aspart  0-15 Units Subcutaneous TID WC  . insulin glargine  12 Units Subcutaneous  QHS  . metoprolol tartrate  12.5 mg Oral BID  . nicotine  21 mg Transdermal Daily  . pantoprazole (PROTONIX) IV  40 mg Intravenous Q12H  . QUEtiapine  800 mg Oral QHS  . sodium chloride flush  3 mL Intravenous Q12H   Continuous Infusions: . sodium chloride    . heparin 1,100 Units/hr (08/20/20 2349)  . lactated ringers 125 mL/hr at 08/21/20 0438     LOS: 1 day   Time Spent in minutes   45 minutes  Thoma Paulsen D.O. on 08/21/2020 at 1:59 PM  Between 7am to 7pm - Please see pager noted on [email or web address]  After 7pm go to [email or web address]  And look for the night coverage person covering for me after hours  [facility] Office  [phone]

## 2020-08-21 NOTE — Progress Notes (Signed)
DAILY PROGRESS NOTE   Patient Name: Ariana Herrera Date of Encounter: 08/21/2020 Cardiologist: Pixie Casino, MD  Chief Complaint   Midepigastric pain  Patient Profile   Ariana Herrera is a 54 y.o. female who is being seen today for the evaluation of epigastric pain at the request of DR. Wentz.  [name] is a 54 y.o. 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 Triglycerides not significantly elevated to cause pancreatitis. Echo personally reviewed, appears to have normal systolic and diastolic function without regional WMA's.   Objective   Vitals:   08/20/20 2047 08/20/20 2346 08/21/20 0427 08/21/20 0800  BP: 128/80 123/67 125/70 119/70  Pulse:   85 86  Resp:  16 16 16   Temp:  99.1 F (37.3 C) 98.7 F (37.1 C) 98.9 F (37.2 C)  TempSrc:  Oral Oral Oral  SpO2:  99% 98% 96%  Weight:   72.7 kg   Height:        Intake/Output Summary (Last 24 hours) at 08/21/2020 1030 Last data filed at 08/21/2020 1022 Gross per 24 hour  Intake 1359.39 ml  Output 1200 ml  Net 159.39 ml   Filed Weights   08/20/20 1500 08/21/20 0427  Weight: 78.2 kg 72.7 kg    Physical Exam   General appearance: alert and no distress Neck: no carotid bruit, no JVD and thyroid not enlarged, symmetric, no tenderness/mass/nodules Lungs: clear to auscultation bilaterally Heart: regular rate and rhythm Abdomen: moderate TTP over the upper mid-epigastrium Extremities: extremities normal, atraumatic, no cyanosis or edema Pulses: 2+ and symmetric Skin: Skin color, texture, turgor normal. No rashes or lesions Neurologic: Grossly normal Psych: Pleasant  Inpatient Medications    Scheduled Meds: .  aspirin EC  81 mg Oral Daily  . atorvastatin  40 mg Oral Daily  . insulin aspart  0-15 Units Subcutaneous TID WC  . insulin glargine  12 Units Subcutaneous QHS  . metoprolol tartrate  12.5 mg Oral BID  . nicotine  21 mg Transdermal Daily  . pantoprazole (PROTONIX) IV  40 mg Intravenous Q12H  . QUEtiapine  800 mg Oral QHS  . sodium chloride flush  3 mL Intravenous Q12H    Continuous Infusions: . sodium chloride    . heparin 1,100 Units/hr (08/20/20 2349)  . lactated ringers 125 mL/hr at 08/21/20 0438    PRN Meds: sodium chloride, acetaminophen, HYDROmorphone (DILAUDID) injection, nitroGLYCERIN, ondansetron (ZOFRAN) IV, sodium chloride flush   Labs   Results for orders placed or performed during the hospital encounter of 08/20/20 (from the past 48 hour(s))  CBC     Status: Abnormal   Collection Time: 08/20/20 10:27 AM  Result Value Ref Range   WBC 12.8 (H) 4.0 - 10.5 K/uL   RBC 4.29 3.87 - 5.11 MIL/uL   Hemoglobin 13.2 12.0 - 15.0 g/dL   HCT 38.3 36 - 46 %   MCV 89.3 80.0 - 100.0 fL   MCH 30.8 26.0 - 34.0 pg   MCHC 34.5 30.0 - 36.0 g/dL   RDW 13.8 11.5 - 15.5 %   Platelets 408 (H) 150 - 400 K/uL   nRBC 0.0 0.0 - 0.2 %  Comment: Performed at [facility], [place] [address], [place], [place] [address]  Troponin I (High Sensitivity)     Status: Abnormal   Collection Time: 08/20/20 10:27 AM  Result Value Ref Range   Troponin I (High Sensitivity) 1,534 (HH) <18 ng/L    Comment: CRITICAL RESULT CALLED TO, READ BACK BY AND VERIFIED WITH: k cobb,rn 08/20/2020 1135 wilderk (NOTE) Elevated high sensitivity troponin I (hsTnI) values and significant  changes across serial measurements may suggest ACS but many other  chronic and acute conditions are known to elevate hsTnI results.  Refer to the Links section for chest pain algorithms and additional  guidance. Performed at [facility], [place] [address], [place], [place] [address]   Protime-INR (order if Patient is taking  Coumadin / Warfarin)     Status: None   Collection Time: 08/20/20 10:27 AM  Result Value Ref Range   Prothrombin Time 13.2 11.4 - 15.2 seconds   INR 1.0 0.8 - 1.2    Comment: (NOTE) INR goal varies based on device and disease states. Performed at [facility], [place] [address] [place], [place] [address]   Type and screen [place]     Status: None   Collection Time: 08/20/20 10:27 AM  Result Value Ref Range   ABO/RH(D) A POS    Antibody Screen NEG    Sample Expiration      08/23/2020,2359 Performed at [facility], [place] [address], [place], [place] [address]   I-Stat beta hCG blood, ED     Status: Abnormal   Collection Time: 08/20/20 10:39 AM  Result Value Ref Range   I-stat hCG, quantitative 6.6 (H) <5 mIU/mL   Comment 3            Comment:   GEST. AGE      CONC.  (mIU/mL)   <=1 WEEK        5 - 50     2 WEEKS       50 - 500     3 WEEKS       100 - 10,000     4 WEEKS     1,000 - 30,000        FEMALE AND NON-PREGNANT FEMALE:     LESS THAN 5 mIU/mL   I-stat chem 8, ED (not at [facility] or [facility])     Status: Abnormal   Collection Time: 08/20/20 10:40 AM  Result Value Ref Range   Sodium 135 135 - 145 mmol/L   Potassium 3.9 3.5 - 5.1 mmol/L   Chloride 101 98 - 111 mmol/L   BUN 14 6 - 20 mg/dL   Creatinine, Ser 0.60 0.44 - 1.00 mg/dL   Glucose, Bld 334 (H) 70 - 99 mg/dL    Comment: Glucose reference range applies only to samples taken after fasting for at least 8 hours.   Calcium, Ion 1.11 (L) 1.15 - 1.40 mmol/L   TCO2 21 (L) 22 - 32 mmol/L   Hemoglobin 13.6 12.0 - 15.0 g/dL   HCT 40.0 36 - 46 %  Respiratory Panel by RT PCR (Flu A&B, Covid) - Nasopharyngeal Swab     Status: None   Collection Time: 08/20/20 11:47 AM   Specimen: Nasopharyngeal Swab  Result Value Ref Range   SARS Coronavirus 2 by RT PCR NEGATIVE NEGATIVE    Comment: (NOTE) SARS-CoV-2 target nucleic acids are NOT DETECTED.  The SARS-CoV-2 RNA is generally detectable in upper  respiratoy  specimens during the acute phase of infection. The lowest concentration of SARS-CoV-2 viral copies this assay can detect is 131 copies/mL. A negative result does not preclude SARS-Cov-2 infection and should not be used as the sole basis for treatment or other patient management decisions. A negative result may occur with  improper specimen collection/handling, submission of specimen other than nasopharyngeal swab, presence of viral mutation(s) within the areas targeted by this assay, and inadequate number of viral copies (<131 copies/mL). A negative result must be combined with clinical observations, patient history, and epidemiological information. The expected result is Negative.  Fact Sheet for Patients:  PinkCheek.be  Fact Sheet for Healthcare Providers:  GravelBags.it  This test is no t yet approved or cleared by the [place] FDA and  has been authorized for detection and/or diagnosis of SARS-CoV-2 by FDA under an Emergency Use Authorization (EUA). This EUA will remain  in effect (meaning this test can be used) for the duration of the COVID-19 declaration under Section 564(b)(1) of the Act, 21 U.S.C. section 360bbb-3(b)(1), unless the authorization is terminated or revoked sooner.     Influenza A by PCR NEGATIVE NEGATIVE   Influenza B by PCR NEGATIVE NEGATIVE    Comment: (NOTE) The Xpert Xpress SARS-CoV-2/FLU/RSV assay is intended as an aid in  the diagnosis of influenza from Nasopharyngeal swab specimens and  should not be used as a sole basis for treatment. Nasal washings and  aspirates are unacceptable for Xpert Xpress SARS-CoV-2/FLU/RSV  testing.  Fact Sheet for Patients: PinkCheek.be  Fact Sheet for Healthcare Providers: GravelBags.it  This test is not yet approved or cleared by the [place] FDA and  has been authorized for  detection and/or diagnosis of SARS-CoV-2 by  FDA under an Emergency Use Authorization (EUA). This EUA will remain  in effect (meaning this test can be used) for the duration of the  Covid-19 declaration under Section 564(b)(1) of the Act, 21  U.S.C. section 360bbb-3(b)(1), unless the authorization is  terminated or revoked. Performed at [facility], [place] [address], [place], [place] [address]   Hepatic function panel     Status: Abnormal   Collection Time: 08/20/20 12:23 PM  Result Value Ref Range   Total Protein 6.1 (L) 6.5 - 8.1 g/dL   Albumin 3.4 (L) 3.5 - 5.0 g/dL   AST 23 15 - 41 U/L   ALT 23 0 - 44 U/L   Alkaline Phosphatase 72 38 - 126 U/L   Total Bilirubin 0.9 0.3 - 1.2 mg/dL   Bilirubin, Direct 0.3 (H) 0.0 - 0.2 mg/dL   Indirect Bilirubin 0.6 0.3 - 0.9 mg/dL    Comment: Performed at [place] [facility] [address] [place], [place] [address]  Lipase, blood     Status: Abnormal   Collection Time: 08/20/20 12:23 PM  Result Value Ref Range   Lipase 148 (H) 11 - 51 U/L    Comment: Performed at [facility], [place] [address] [place], [place] [address]  ABO/Rh     Status: None   Collection Time: 08/20/20  1:32 PM  Result Value Ref Range   ABO/RH(D)      A POS Performed at [facility] [address], [place], [place] [address]   Troponin I (High Sensitivity)     Status: Abnormal   Collection Time: 08/20/20  1:32 PM  Result Value Ref Range   Troponin I (High Sensitivity) 1,529 (HH) <18 ng/L    Comment: CRITICAL VALUE  NOTED.  VALUE IS CONSISTENT WITH PREVIOUSLY REPORTED AND CALLED VALUE. (NOTE) Elevated high sensitivity troponin I (hsTnI) values and significant  changes across serial measurements may suggest ACS but many other  chronic and acute conditions are known to elevate hsTnI results.  Refer to the Links section for chest pain algorithms and additional  guidance. Performed at [facility], [place] [address] [place], [place] [address]    Hemoglobin A1c     Status: Abnormal   Collection Time: 08/20/20  2:21 PM  Result Value Ref Range   Hgb A1c MFr Bld 10.8 (H) 4.8 - 5.6 %    Comment: (NOTE)         Prediabetes: 5.7 - 6.4         Diabetes: >6.4         Glycemic control for adults with diabetes: <7.0    Mean Plasma Glucose 263 mg/dL    Comment: (NOTE) Performed At: [facility] [place], [place] [id number] Rush Farmer MD [id number]   Troponin I (High Sensitivity)     Status: Abnormal   Collection Time: 08/20/20  3:03 PM  Result Value Ref Range   Troponin I (High Sensitivity) 1,415 (HH) <18 ng/L    Comment: CRITICAL VALUE NOTED.  VALUE IS CONSISTENT WITH PREVIOUSLY REPORTED AND CALLED VALUE. (NOTE) Elevated high sensitivity troponin I (hsTnI) values and significant  changes across serial measurements may suggest ACS but many other  chronic and acute conditions are known to elevate hsTnI results.  Refer to the Links section for chest pain algorithms and additional  guidance. Performed at [facility], [place] [address], [place], [place] [address]   Magnesium     Status: Abnormal   Collection Time: 08/20/20  3:03 PM  Result Value Ref Range   Magnesium 1.5 (L) 1.7 - 2.4 mg/dL    Comment: SLIGHT HEMOLYSIS Performed at [facility] [address], [place], [place] [address]   T4, free     Status: None   Collection Time: 08/20/20  3:47 PM  Result Value Ref Range   Free T4 0.75 0.61 - 1.12 ng/dL    Comment: (NOTE) Biotin ingestion may interfere with free T4 tests. If the results are inconsistent with the TSH level, previous test results, or the clinical presentation, then consider biotin interference. If needed, order repeat testing after stopping biotin. Performed at [facility], [place] [address], [place], [place] [address]   TSH     Status: None   Collection Time: 08/20/20  3:47 PM  Result Value Ref Range   TSH 1.011 0.350 - 4.500 uIU/mL    Comment: Performed by  a 3rd Generation assay with a functional sensitivity of <=0.01 uIU/mL. Performed at [facility], [place] [address], [place], [place] [address]   Magnesium     Status: Abnormal   Collection Time: 08/20/20  3:47 PM  Result Value Ref Range   Magnesium 1.3 (L) 1.7 - 2.4 mg/dL    Comment: Performed at [facility] [address] [place], [place] [address]  Lipid panel     Status: Abnormal   Collection Time: 08/20/20  3:47 PM  Result Value Ref Range   Cholesterol 170 0 - 200 mg/dL   Triglycerides 153 (H) <150 mg/dL   HDL 38 (L) >40 mg/dL   Total CHOL/HDL Ratio 4.5 RATIO   VLDL 31 0 - 40 mg/dL   LDL Cholesterol 101 (H) 0 - 99  mg/dL    Comment:        Total Cholesterol/HDL:CHD Risk Coronary Heart Disease Risk Table                     Men   Women  1/2 Average Risk   3.4   3.3  Average Risk       5.0   4.4  2 X Average Risk   9.6   7.1  3 X Average Risk  23.4   11.0        Use the calculated Patient Ratio above and the CHD Risk Table to determine the patient's CHD Risk.        ATP III CLASSIFICATION (LDL):  <100     mg/dL   Optimal  100-129  mg/dL   Near or Above                    Optimal  130-159  mg/dL   Borderline  160-189  mg/dL   High  >190     mg/dL   Very High Performed at [facility] [address] [place], [place] [address]   HIV Antibody (routine testing w rflx)     Status: None   Collection Time: 08/20/20  6:13 PM  Result Value Ref Range   HIV Screen 4th Generation wRfx Non Reactive Non Reactive    Comment: Performed at [facility], [place] [address] [place], [place] [address]  Glucose, capillary     Status: Abnormal   Collection Time: 08/20/20  8:20 PM  Result Value Ref Range   Glucose-Capillary 240 (H) 70 - 99 mg/dL    Comment: Glucose reference range applies only to samples taken after fasting for at least 8 hours.  Heparin level (unfractionated)     Status: Abnormal   Collection Time: 08/20/20  9:49 PM  Result Value Ref Range    Heparin Unfractionated <0.10 (L) 0.30 - 0.70 IU/mL    Comment: (NOTE) If heparin results are below expected values, and patient dosage has  been confirmed, suggest follow up testing of antithrombin III levels. Performed at [facility], [place] [address], [place], [place] [address]   Glucose, capillary     Status: Abnormal   Collection Time: 08/21/20 12:07 AM  Result Value Ref Range   Glucose-Capillary 224 (H) 70 - 99 mg/dL    Comment: Glucose reference range applies only to samples taken after fasting for at least 8 hours.  Glucose, capillary     Status: Abnormal   Collection Time: 08/21/20  4:28 AM  Result Value Ref Range   Glucose-Capillary 122 (H) 70 - 99 mg/dL    Comment: Glucose reference range applies only to samples taken after fasting for at least 8 hours.   Comment 1 Notify RN    Comment 2 Document in Chart   Basic metabolic panel     Status: Abnormal   Collection Time: 08/21/20  5:58 AM  Result Value Ref Range   Sodium 136 135 - 145 mmol/L   Potassium 3.6 3.5 - 5.1 mmol/L   Chloride 106 98 - 111 mmol/L   CO2 24 22 - 32 mmol/L   Glucose, Bld 103 (H) 70 - 99 mg/dL    Comment: Glucose reference range applies only to samples taken after fasting for at least 8 hours.   BUN 9 6 - 20 mg/dL   Creatinine, Ser 0.71 0.44 - 1.00 mg/dL   Calcium 8.6 (L)  8.9 - 10.3 mg/dL   GFR, Estimated >60 >60 mL/min    Comment: (NOTE) Calculated using the CKD-EPI Creatinine Equation (2021)    Anion gap 6 5 - 15    Comment: Performed at [facility], [place] [address] [place], [place] [address]  CBC     Status: Abnormal   Collection Time: 08/21/20  5:58 AM  Result Value Ref Range   WBC 9.2 4.0 - 10.5 K/uL   RBC 4.02 3.87 - 5.11 MIL/uL   Hemoglobin 12.2 12.0 - 15.0 g/dL   HCT 35.7 (L) 36 - 46 %   MCV 88.8 80.0 - 100.0 fL   MCH 30.3 26.0 - 34.0 pg   MCHC 34.2 30.0 - 36.0 g/dL   RDW 13.9 11.5 - 15.5 %   Platelets 327 150 - 400 K/uL   nRBC 0.0 0.0 - 0.2 %    Comment: Performed at  [facility], [place] [address], [place], [place] [address]  Heparin level (unfractionated)     Status: None   Collection Time: 08/21/20  5:58 AM  Result Value Ref Range   Heparin Unfractionated 0.47 0.30 - 0.70 IU/mL    Comment: (NOTE) If heparin results are below expected values, and patient dosage has  been confirmed, suggest follow up testing of antithrombin III levels. Performed at [facility], [place] [address] [place], [place] [address]   Protime-INR     Status: None   Collection Time: 08/21/20  5:58 AM  Result Value Ref Range   Prothrombin Time 13.8 11.4 - 15.2 seconds   INR 1.1 0.8 - 1.2    Comment: (NOTE) INR goal varies based on device and disease states. Performed at [facility], [place] [address] [place], [place] [address]   Lipid panel     Status: Abnormal   Collection Time: 08/21/20  5:58 AM  Result Value Ref Range   Cholesterol 135 0 - 200 mg/dL   Triglycerides 227 (H) <150 mg/dL   HDL 29 (L) >40 mg/dL   Total CHOL/HDL Ratio 4.7 RATIO   VLDL 45 (H) 0 - 40 mg/dL   LDL Cholesterol 61 0 - 99 mg/dL    Comment:        Total Cholesterol/HDL:CHD Risk Coronary Heart Disease Risk Table                     Men   Women  1/2 Average Risk   3.4   3.3  Average Risk       5.0   4.4  2 X Average Risk   9.6   7.1  3 X Average Risk  23.4   11.0        Use the calculated Patient Ratio above and the CHD Risk Table to determine the patient's CHD Risk.        ATP III CLASSIFICATION (LDL):  <100     mg/dL   Optimal  100-129  mg/dL   Near or Above                    Optimal  130-159  mg/dL   Borderline  160-189  mg/dL   High  >190     mg/dL   Very High Performed at [facility] [address], [place], [place] [address]   Magnesium     Status: None   Collection Time: 08/21/20  5:58 AM  Result Value Ref Range   Magnesium  1.7 1.7 - 2.4 mg/dL    Comment: Performed at [facility], [place] [address], [place], [place] [address]  Glucose, capillary      Status: Abnormal   Collection Time: 08/21/20  8:36 AM  Result Value Ref Range   Glucose-Capillary 102 (H) 70 - 99 mg/dL    Comment: Glucose reference range applies only to samples taken after fasting for at least 8 hours.    ECG   Normal sinus rhythm at 76, no ischemic changes - Personally Reviewed  Telemetry   Sinus rhythm - Personally Reviewed  Radiology    DG Chest Portable 1 View  Result Date: 08/20/2020 CLINICAL DATA:  Chest pain EXAM: PORTABLE CHEST 1 VIEW COMPARISON:  Same-day CT FINDINGS: The heart size and mediastinal contours are within normal limits. No focal airspace consolidation, pleural effusion, or pneumothorax. The visualized skeletal structures are unremarkable. IMPRESSION: No active disease. Electronically Signed   By: Davina Poke D.O.   On: 08/20/2020 14:55   CT Angio Chest/Abd/Pel for Dissection W and/or Wo Contrast  Result Date: 08/20/2020 CLINICAL DATA:  Severe epigastric pain, abdominal pain. History of left breast cancer treated with lumpectomy, chemotherapy, and radiation in September of 2019 EXAM: CT ANGIOGRAPHY CHEST, ABDOMEN AND PELVIS TECHNIQUE: Non-contrast CT of the chest was initially obtained. Multidetector CT imaging through the chest, abdomen and pelvis was performed using the standard protocol during bolus administration of intravenous contrast. Multiplanar reconstructed images and MIPs were obtained and reviewed to evaluate the vascular anatomy. CONTRAST:  117mL OMNIPAQUE IOHEXOL 350 MG/ML SOLN COMPARISON:  None. FINDINGS: CTA CHEST FINDINGS Cardiovascular: Initial noncontrast imaging of the chest demonstrates no evidence for aortic intramural hematoma. Arterial phase postcontrast imaging demonstrates no evidence of aortic aneurysm or dissection. Four vessel arch with the left vertebral artery arising directly from the arch, an anatomic variant. There is scattered atherosclerotic calcification of the aorta. There is an 11 x 5 mm area of  noncalcified atherosclerotic plaque with a slightly polypoid configuration projecting into the lumen of the distal descending thoracic aorta (series 12, image 93). Central pulmonary vasculature is within normal limits. No central filling defect. Normal heart size. No pericardial effusion. Mediastinum/Nodes: No axillary, mediastinal, or hilar lymphadenopathy. Thyroid and trachea within normal limits. Small sliding-type hiatal hernia. Esophagus otherwise within normal limits. Lungs/Pleura: No focal airspace consolidation. No suspicious pulmonary nodules or masses. No pleural effusion or pneumothorax. Musculoskeletal: There is a somewhat mottled appearance of the left third rib anteriorly (series 7, image 34) with some scarring in the adjacent lung, potentially representing post radiation changes. Osseous structures of the bony thorax appear otherwise within normal limits without suspicious bony lesion. Skin thickening of the left breast with irregularly marginated density within the inner aspect of the breast abutting the pectoralis musculature measuring approximately 2.3 x 1.8 x 2.2 cm (series 7, image 44; series 12, image 116), which may represent patient's lumpectomy site. Review of the MIP images confirms the above findings. CTA ABDOMEN AND PELVIS FINDINGS VASCULAR Aorta: Normal caliber aorta without aneurysm, dissection, vasculitis or significant stenosis. Mild calcified and noncalcified atherosclerotic plaque. Celiac: Patent without evidence of aneurysm, dissection, vasculitis or significant stenosis. SMA: Patent without evidence of aneurysm, dissection, vasculitis or significant stenosis. Renals: Both renal arteries are patent without evidence of aneurysm, dissection, vasculitis, fibromuscular dysplasia or significant stenosis. IMA: Patent. Inflow: Patent without evidence of aneurysm, dissection, vasculitis or significant stenosis. Mild calcified and noncalcified atherosclerotic plaque. Veins: No obvious venous  abnormality within the limitations of this arterial  phase study. Review of the MIP images confirms the above findings. NON-VASCULAR Hepatobiliary: Diffusely decreased attenuation of the hepatic parenchyma. No focal hepatic lesion is identified on arterial phase imaging. Status post cholecystectomy. No biliary dilatation. Pancreas: There is peripancreatic fat stranding around the pancreatic head and proximal body (series 7, image 93). No pancreatic ductal dilatation. No evidence of parenchymal non enhancement. No peripancreatic fluid collection. Spleen: 1.8 x 0.9 cm rounded area of hypoattenuation within the anterior aspect of the spleen (series 7, image 84; series 12, image 160) measuring greater than fluid density. Spleen is otherwise within normal limits. There are 2 small splenules in the region of the splenic hilum. Adrenals/Urinary Tract: No adrenal nodules or masses. There are wedge-shaped areas of decreased enhancement within the inferior pole of the right kidney (series 10, images 117 and 121). Possible tiny area of hypoattenuating cortex in the inferior pole of the left kidney (series 10, image 119). Kidneys have otherwise symmetric enhancement. No enhancing renal mass, stone, or hydronephrosis. Urinary bladder unremarkable. Stomach/Bowel: Small hiatal hernia. Stomach is otherwise within normal limits. Appendix appears normal (series 7, image 159). No evidence of bowel wall thickening, distention, or inflammatory changes. Moderate volume stool within the colon. Lymphatic: No abdominopelvic lymphadenopathy. Reproductive: Uterus and bilateral adnexa are unremarkable. Other: No free fluid. No abdominopelvic fluid collection. No pneumoperitoneum. No abdominal wall hernia. Musculoskeletal: No acute or significant osseous findings. Review of the MIP images confirms the above findings. IMPRESSION: Vascular findings: 1. Negative for aortic aneurysm or dissection. 2. Small wedge-shaped areas of decreased enhancement  within the inferior poles of the right kidney and left kidney, concerning for areas of focal renal infarction. Pyelonephritis could have a similar appearance. Correlation with urinalysis is recommended. 3. Focal 11 x 5 mm area of noncalcified atherosclerotic plaque with a slightly polypoid configuration projecting into the lumen of the distal descending thoracic aorta. This may be at risk for potential plaque thrombosis. Nonvascular findings: 1. Findings of acute uncomplicated pancreatitis. No evidence of pancreatic necrosis or peripancreatic fluid collection. Correlate with serum lipase. 2. Findings suggestive of hepatic steatosis. 3. Indeterminate 1.8 cm rounded area of hypoattenuation within the anterior aspect of the spleen. Further evaluation with contrast-enhanced MRI can be performed on a nonemergent basis. 4. Slightly mottled appearance of the left third rib anteriorly with some adjacent scarring in the adjacent lung, potentially representing post radiation changes. 5. Left breast skin thickening with suspected lumpectomy site at the inner left breast. Continued mammographic follow-up is recommended. Electronically Signed   By: Davina Poke D.O.   On: 08/20/2020 11:40   US Abdomen Limited RUQ (LIVER/GB)  Result Date: 08/20/2020 CLINICAL DATA:  Pancreatitis.  Right upper quadrant pain.  Nausea. EXAM: ULTRASOUND ABDOMEN LIMITED RIGHT UPPER QUADRANT COMPARISON:  Dissection protocol CT earlier today. FINDINGS: Gallbladder: Surgically absent. Common bile duct: Diameter: 4 mm, nondilated. Liver: Heterogeneously increased in parenchymal echogenicity. Liver parenchyma is difficult to penetrate. No focal abnormalities seen by ultrasound. Portal vein is patent on color Doppler imaging with normal direction of blood flow towards the liver. Other: No right upper quadrant ascites. IMPRESSION: 1. Post cholecystectomy without biliary dilatation. 2. Heterogeneous hepatic parenchyma which is difficult to penetrate,  typical of steatosis. Electronically Signed   By: Keith Rake M.D.   On: 08/20/2020 19:46    Cardiac Studies   Echo pending  Assessment   1. Principal Problem: 2.   NSTEMI (non-ST elevated myocardial infarction) ([place]) 3. Active Problems: 4.   Breast cancer ([place]) 5.  Pancreatitis, acute 6.   Bipolar disease, chronic ([place]) 7.   Diabetes mellitus type 2 in obese ([facility]) 8.   Class 1 obesity due to excess calories with body mass index (BMI) of 31.0 to 31.9 in adult 9.   Plan   1. Noted to have flat, but significantly elevated troponin. Sounds like she had a similar presentation before and cath did not result in intervention, but told she "had an MI". Certain by elevated lipase, CT imaging and exam findings that she has pancreatitis. Literature review suggests that this can be associated with a falsely elevated troponin. I personally reviewed her echo today - there is normal LV function without regional WMA's and normal diastolic function - very unusual finding for acute MI. Will probably recommend coronary evaluation - I think we could consider a cardiac CT rather than cath. She is agreeable to this plan.  Time Spent Directly with Patient:  I have spent a total of 25 minutes with the patient reviewing hospital notes, telemetry, EKGs, labs and examining the patient as well as establishing an assessment and plan that was discussed personally with the patient.  > 50% of time was spent in direct patient care.  Length of Stay:  LOS: 1 day   Pixie Casino, MD, [facility], [place] Director of the Advanced Lipid Disorders &  Cardiovascular Risk Reduction Clinic Diplomate of the American Board of Clinical Lipidology Attending Cardiologist  Direct Dial: [phone]  Fax: [phone]  Website:  [email or web address] Sayed 08/21/2020, 10:30 AM

## 2020-08-21 NOTE — Progress Notes (Signed)
.   Called and discussed the calcium score findings with the patient.  Unfortunately we were unable to get an adequate heart rate allowing a safe blood pressure in order to do a CT coronary angiogram.  She was noted however to have an elevated calcium score out of proportion for age.  Even though her echo appears normal, I cannot exclude a cardiac event.  Therefore would recommend left heart catheterization.  I discussed this with her including the risk, benefits and alternatives and she is agreeable to proceed.  Please continue to keep her n.p.o. through tomorrow and obtain consent for cardiac catheterization.  Pixie Casino, MD, [facility], [place] Director of the Advanced Lipid Disorders &  Cardiovascular Risk Reduction Clinic Diplomate of the American Board of Clinical Lipidology Attending Cardiologist  Direct Dial: [phone]  Fax: [phone]  Website:  [email or web address]

## 2020-08-21 NOTE — Progress Notes (Signed)
Attempted CCTA but unable to complete due to a high heart rate despite PO and IV medications given.  Pt given 50mg  metoprolol tartrate 2 hr prior to scan, then while in CT1 HR was assessed and 75bpm,  5mg  IV metoprolol given at and then gave 1 SL NTG for vasodilation which subsequently increased HR to 85bpm. Hilty consulted and suggested 10mg  diltiazem given.  BP assess before diltiazem was given and was 770 systolic.   After diltiazem was given BP 94 systolic and HR 87.   Decision made to cancel test by hilty. Patient aware of the same.  Marchia Bond RN Navigator Cardiac Imaging [facility] [phone] Office  [phone] Cell

## 2020-08-22 ENCOUNTER — Encounter (HOSPITAL_COMMUNITY): Payer: Self-pay | Admitting: Cardiovascular Disease

## 2020-08-22 ENCOUNTER — Encounter (HOSPITAL_COMMUNITY)
Admission: EM | Disposition: A | Discharge status: Home / Self Care | Payer: Self-pay | Source: Home / Self Care | Attending: Internal Medicine

## 2020-08-22 DIAGNOSIS — R079 Chest pain, unspecified: Secondary | ICD-10-CM

## 2020-08-22 DIAGNOSIS — I214 Non-ST elevation (NSTEMI) myocardial infarction: Secondary | ICD-10-CM | POA: Diagnosis not present

## 2020-08-22 DIAGNOSIS — I2510 Atherosclerotic heart disease of native coronary artery without angina pectoris: Secondary | ICD-10-CM | POA: Diagnosis not present

## 2020-08-22 DIAGNOSIS — K8500 Idiopathic acute pancreatitis without necrosis or infection: Secondary | ICD-10-CM | POA: Diagnosis not present

## 2020-08-22 DIAGNOSIS — R7989 Other specified abnormal findings of blood chemistry: Secondary | ICD-10-CM

## 2020-08-22 DIAGNOSIS — R778 Other specified abnormalities of plasma proteins: Secondary | ICD-10-CM

## 2020-08-22 HISTORY — PX: LEFT HEART CATH AND CORONARY ANGIOGRAPHY: CATH118249

## 2020-08-22 LAB — BASIC METABOLIC PANEL
Anion gap: 9 (ref 5–15)
BUN: 6 mg/dL (ref 6–20)
CO2: 22 mmol/L (ref 22–32)
Calcium: 8.6 mg/dL — ABNORMAL LOW (ref 8.9–10.3)
Chloride: 106 mmol/L (ref 98–111)
Creatinine, Ser: 0.66 mg/dL (ref 0.44–1.00)
GFR, Estimated: >60 mL/min (ref >60)
Glucose, Bld: 68 mg/dL — ABNORMAL LOW (ref 70–99)
Potassium: 3.3 mmol/L — ABNORMAL LOW (ref 3.5–5.1)
Sodium: 137 mmol/L (ref 135–145)

## 2020-08-22 LAB — LIPASE, BLOOD: Lipase: 57 U/L — ABNORMAL HIGH (ref 11–51)

## 2020-08-22 LAB — GLUCOSE, CAPILLARY
Glucose-Capillary: 104 mg/dL — ABNORMAL HIGH (ref 70–99)
Glucose-Capillary: 123 mg/dL — ABNORMAL HIGH (ref 70–99)
Glucose-Capillary: 126 mg/dL — ABNORMAL HIGH (ref 70–99)
Glucose-Capillary: 141 mg/dL — ABNORMAL HIGH (ref 70–99)
Glucose-Capillary: 189 mg/dL — ABNORMAL HIGH (ref 70–99)
Glucose-Capillary: 59 mg/dL — ABNORMAL LOW (ref 70–99)
Glucose-Capillary: 66 mg/dL — ABNORMAL LOW (ref 70–99)
Glucose-Capillary: 98 mg/dL (ref 70–99)

## 2020-08-22 LAB — HEPARIN LEVEL (UNFRACTIONATED)
Heparin Unfractionated: 0.43 IU/mL (ref 0.30–0.70)
Heparin Unfractionated: 0.5 IU/mL (ref 0.30–0.70)

## 2020-08-22 LAB — CBC
HCT: 31.5 % — ABNORMAL LOW (ref 36.0–46.0)
Hemoglobin: 10.7 g/dL — ABNORMAL LOW (ref 12.0–15.0)
MCH: 30.3 pg (ref 26.0–34.0)
MCHC: 34 g/dL (ref 30.0–36.0)
MCV: 89.2 fL (ref 80.0–100.0)
Platelets: 263 10*3/uL (ref 150–400)
RBC: 3.53 MIL/uL — ABNORMAL LOW (ref 3.87–5.11)
RDW: 14 % (ref 11.5–15.5)
WBC: 6.3 10*3/uL (ref 4.0–10.5)
nRBC: 0 % (ref 0.0–0.2)

## 2020-08-22 LAB — MAGNESIUM: Magnesium: 1.5 mg/dL — ABNORMAL LOW (ref 1.7–2.4)

## 2020-08-22 SURGERY — LEFT HEART CATH AND CORONARY ANGIOGRAPHY
Anesthesia: LOCAL

## 2020-08-22 MED ORDER — HEPARIN SODIUM (PORCINE) 1000 UNIT/ML IJ SOLN
INTRAMUSCULAR | Status: DC | PRN
  Administered 2020-08-22: 4000 [IU] via INTRAVENOUS

## 2020-08-22 MED ORDER — PNEUMOCOCCAL VAC POLYVALENT 25 MCG/0.5ML IJ INJ
0.5000 mL | INJECTION | INTRAMUSCULAR | Status: DC
  Filled 2020-08-22: qty 0.5

## 2020-08-22 MED ORDER — SODIUM CHLORIDE 0.9% FLUSH
3.0000 mL | Freq: Two times a day (BID) | INTRAVENOUS | Status: DC
  Administered 2020-08-23 – 2020-08-24 (×3): 3 mL via INTRAVENOUS

## 2020-08-22 MED ORDER — LIDOCAINE HCL (PF) 1 % IJ SOLN
INTRAMUSCULAR | Status: DC | PRN
  Administered 2020-08-22: 2 mL

## 2020-08-22 MED ORDER — FENTANYL CITRATE (PF) 100 MCG/2ML IJ SOLN
INTRAMUSCULAR | Status: DC | PRN
Start: 2020-08-22 — End: 2020-08-22
  Administered 2020-08-22: 50 ug via INTRAVENOUS

## 2020-08-22 MED ORDER — LABETALOL HCL 5 MG/ML IV SOLN: 10.0000 mg | INTRAVENOUS | Status: AC | PRN

## 2020-08-22 MED ORDER — HYDRALAZINE HCL 20 MG/ML IJ SOLN: 10.0000 mg | INTRAMUSCULAR | Status: AC | PRN

## 2020-08-22 MED ORDER — IOHEXOL 350 MG/ML SOLN
INTRAVENOUS | Status: DC | PRN
  Administered 2020-08-22: 50 mL

## 2020-08-22 MED ORDER — VERAPAMIL HCL 2.5 MG/ML IV SOLN
INTRAVENOUS | Status: DC | PRN
  Administered 2020-08-22: 10 mL via INTRA_ARTERIAL

## 2020-08-22 MED ORDER — DEXTROSE-NACL 5-0.45 % IV SOLN: Freq: Once | INTRAVENOUS | Status: AC

## 2020-08-22 MED ORDER — MIDAZOLAM HCL 2 MG/2ML IJ SOLN
INTRAMUSCULAR | Status: DC | PRN
  Administered 2020-08-22: 2 mg via INTRAVENOUS

## 2020-08-22 MED ORDER — SODIUM CHLORIDE 0.9 % IV SOLN: 250.0000 mL | INTRAVENOUS | Status: DC | PRN

## 2020-08-22 MED ORDER — INSULIN GLARGINE 100 UNIT/ML ~~LOC~~ SOLN
6.0000 [IU] | Freq: Every day | SUBCUTANEOUS | Status: DC
  Administered 2020-08-22 – 2020-08-24 (×3): 6 [IU] via SUBCUTANEOUS
  Filled 2020-08-22 (×4): qty 0.06

## 2020-08-22 MED ORDER — SODIUM CHLORIDE 0.9 % IV SOLN: INTRAVENOUS | Status: DC

## 2020-08-22 MED ORDER — FENTANYL CITRATE (PF) 100 MCG/2ML IJ SOLN
INTRAMUSCULAR | Status: AC
Start: 2020-08-22 — End: ?
  Filled 2020-08-22: qty 2

## 2020-08-22 MED ORDER — HEPARIN SODIUM (PORCINE) 1000 UNIT/ML IJ SOLN
INTRAMUSCULAR | Status: AC
  Filled 2020-08-22: qty 1

## 2020-08-22 MED ORDER — SODIUM CHLORIDE 0.9 % IV SOLN: INTRAVENOUS | Status: AC

## 2020-08-22 MED ORDER — HEPARIN (PORCINE) IN NACL 1000-0.9 UT/500ML-% IV SOLN
INTRAVENOUS | Status: AC
  Filled 2020-08-22: qty 1000

## 2020-08-22 MED ORDER — DEXTROSE 50 % IV SOLN
INTRAVENOUS | Status: AC
  Filled 2020-08-22: qty 50

## 2020-08-22 MED ORDER — MAGNESIUM SULFATE 2 GM/50ML IV SOLN
2.0000 g | Freq: Once | INTRAVENOUS | Status: AC
  Administered 2020-08-22: 2 g via INTRAVENOUS
  Filled 2020-08-22: qty 50

## 2020-08-22 MED ORDER — SODIUM CHLORIDE 0.9% FLUSH: 3.0000 mL | INTRAVENOUS | Status: DC | PRN

## 2020-08-22 MED ORDER — LIDOCAINE HCL (PF) 1 % IJ SOLN
INTRAMUSCULAR | Status: AC
  Filled 2020-08-22: qty 30

## 2020-08-22 MED ORDER — POTASSIUM CHLORIDE 10 MEQ/100ML IV SOLN
10.0000 meq | INTRAVENOUS | Status: AC
  Administered 2020-08-22 (×6): 10 meq via INTRAVENOUS
  Filled 2020-08-22 (×6): qty 100

## 2020-08-22 MED ORDER — DEXTROSE 50 % IV SOLN
12.5000 g | INTRAVENOUS | Status: AC
  Administered 2020-08-22: 12.5 g via INTRAVENOUS

## 2020-08-22 MED ORDER — MIDAZOLAM HCL 2 MG/2ML IJ SOLN
INTRAMUSCULAR | Status: AC
  Filled 2020-08-22: qty 2

## 2020-08-22 MED ORDER — VERAPAMIL HCL 2.5 MG/ML IV SOLN
INTRAVENOUS | Status: AC
  Filled 2020-08-22: qty 2

## 2020-08-22 MED ORDER — HEPARIN (PORCINE) IN NACL 1000-0.9 UT/500ML-% IV SOLN
INTRAVENOUS | Status: DC | PRN
  Administered 2020-08-22 (×2): 500 mL

## 2020-08-22 SURGICAL SUPPLY — 9 items

## 2020-08-22 NOTE — Interval H&P Note (Signed)
History and Physical Interval Note:  08/22/2020 12:22 PM  Ariana Herrera  has presented today for surgery, with the diagnosis of chest pain.  The various methods of treatment have been discussed with the patient and family. After consideration of risks, benefits and other options for treatment, the patient has consented to  Procedure(s): LEFT HEART CATH AND CORONARY ANGIOGRAPHY (N/A) as a surgical intervention.  The patient's history has been reviewed, patient examined, no change in status, stable for surgery.  I have reviewed the patient's chart and labs.  Questions were answered to the patient's satisfaction.    Cath Lab Visit (complete for each Cath Lab visit)  Clinical Evaluation Leading to the Procedure:   ACS: No.  Non-ACS:    Anginal Classification: CCS III  Anti-ischemic medical therapy: No Therapy  Non-Invasive Test Results: No non-invasive testing performed  Prior CABG: No previous CABG        Lauree Chandler

## 2020-08-22 NOTE — Progress Notes (Signed)
Inpatient Diabetes Program Recommendations  AACE/ADA: New Consensus Statement on Inpatient Glycemic Control (2015)  Target Ranges:  Prepandial:   less than 140 mg/dL      Peak postprandial:   less than 180 mg/dL (1-2 hours)      Critically ill patients:  140 - 180 mg/dL   Lab Results  Component Value Date   GLUCAP 123 (H) 08/22/2020   HGBA1C 10.8 (H) 08/20/2020    Review of Glycemic Control Results for DELMI, FULFER (MRN [id number]) as of 08/22/2020 08:47  Ref. Range 08/22/2020 00:41 08/22/2020 04:00 08/22/2020 04:39 08/22/2020 07:43  Glucose-Capillary Latest Ref Range: 70 - 99 mg/dL 126 (H) 66 (L) 141 (H) 123 (H)   Diabetes history: Type 2 DM Outpatient Diabetes medications: Trulicity 2.63 mg Qwk, Lantus 25 units QD, Metformin 1000 mg BID Current orders for Inpatient glycemic control: Lantus 6 units QHS, Novolog 0-15 units TID  Inpatient Diabetes Program Recommendations:    Noted potential for pancreatitis, would be guarded with resuming Truliclity at discharge.   Also, noted hypoglycemia and subsequent insulin adjustments. Following.   Thanks, Bronson Curb, MSN, RNC-OB Diabetes Coordinator [phone] (8a-5p)

## 2020-08-22 NOTE — H&P (View-Only) (Signed)
DAILY PROGRESS NOTE   Patient Name: Ariana Herrera Date of Encounter: 08/22/2020 Cardiologist: Pixie Casino, MD  Chief Complaint   Midepigastric pain  Patient Profile   Ariana Herrera is a 54 y.o. female who is being seen today for the evaluation of epigastric pain at the request of DR. Wentz.  Ariana Herrera is a 54 y.o. 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 Did get a calcium score which was 31, 94th percentile for age and sex matched control. No significant extracardiac findings. There was left main and LAD calcification. Echo was normal. I don't suspect obstructive CAD, however, cannot exclude it - will need to proceed with cath.  Objective   Vitals:   08/22/20 0011 08/22/20 0228 08/22/20 0357 08/22/20 0742  BP: 99/60  110/65 111/63  Pulse: 90  93 91  Resp: 20  18 16   Temp: 98.2 F (36.8 C)  99 F (37.2 C) 99.3 F (37.4 C)  TempSrc: Oral  Oral Oral  SpO2: 93%  97% 93%  Weight:  74.3 kg    Height:        Intake/Output Summary (Last 24 hours) at 08/22/2020 0900 Last data filed at 08/22/2020 1610 Gross per 24 hour  Intake 1461.11 ml  Output 1700 ml  Net -238.89 ml   Filed Weights   08/20/20 1500 08/21/20 0427 08/22/20 0228  Weight: 78.2 kg 72.7 kg 74.3 kg    Physical Exam   General appearance: alert and no distress Neck: no carotid bruit, no JVD and thyroid not enlarged, symmetric, no tenderness/mass/nodules Lungs: clear to auscultation bilaterally Heart: regular rate and rhythm Abdomen: moderate TTP over the upper mid-epigastrium Extremities: extremities normal, atraumatic, no cyanosis or  edema Pulses: 2+ and symmetric Skin: Skin color, texture, turgor normal. No rashes or lesions Neurologic: Grossly normal Psych: Pleasant  Inpatient Medications    Scheduled Meds: . aspirin EC  81 mg Oral Daily  . atorvastatin  40 mg Oral Daily  . insulin aspart  0-15 Units Subcutaneous TID WC  . insulin glargine  6 Units Subcutaneous QHS  . metoprolol tartrate  12.5 mg Oral BID  . nicotine  21 mg Transdermal Daily  . pantoprazole (PROTONIX) IV  40 mg Intravenous Q12H  . QUEtiapine  800 mg Oral QHS  . sodium chloride flush  3 mL Intravenous Q12H  . sodium chloride flush  3 mL Intravenous Q12H    Continuous Infusions: . sodium chloride    . sodium chloride    . heparin 1,250 Units/hr (08/21/20 1821)  . potassium chloride 10 mEq (08/22/20 0758)    PRN Meds: sodium chloride, sodium chloride, acetaminophen, HYDROmorphone (DILAUDID) injection, nitroGLYCERIN, ondansetron (ZOFRAN) IV, sodium chloride flush, sodium chloride flush   Labs   Results for orders placed or performed during the hospital encounter of 08/20/20 (from the past 48 hour(s))  CBC     Status: Abnormal   Collection Time: 08/20/20 10:27 AM  Result Value Ref Range   WBC 12.8 (H) 4.0 - 10.5 K/uL   RBC 4.29 3.87 - 5.11 MIL/uL   Hemoglobin 13.2 12.0 - 15.0 g/dL   HCT  38.3 36 - 46 %   MCV 89.3 80.0 - 100.0 fL   MCH 30.8 26.0 - 34.0 pg   MCHC 34.5 30.0 - 36.0 g/dL   RDW 13.8 11.5 - 15.5 %   Platelets 408 (H) 150 - 400 K/uL   nRBC 0.0 0.0 - 0.2 %    Comment: Performed at [facility] [address] [place], [place] [address]  Troponin I (High Sensitivity)     Status: Abnormal   Collection Time: 08/20/20 10:27 AM  Result Value Ref Range   Troponin I (High Sensitivity) 1,534 (HH) <18 ng/L    Comment: CRITICAL RESULT CALLED TO, READ BACK BY AND VERIFIED WITH: k cobb,rn 08/20/2020 1135 wilderk (NOTE) Elevated high sensitivity troponin I (hsTnI) values and significant  changes across serial measurements may  suggest ACS but many other  chronic and acute conditions are known to elevate hsTnI results.  Refer to the Links section for chest pain algorithms and additional  guidance. Performed at [facility], [place] [address], [place], [place] [address]   Protime-INR (order if Patient is taking Coumadin / Warfarin)     Status: None   Collection Time: 08/20/20 10:27 AM  Result Value Ref Range   Prothrombin Time 13.2 11.4 - 15.2 seconds   INR 1.0 0.8 - 1.2    Comment: (NOTE) INR goal varies based on device and disease states. Performed at [facility], [place] [address] [place], [place] [address]   Type and screen [place]     Status: None   Collection Time: 08/20/20 10:27 AM  Result Value Ref Range   ABO/RH(D) A POS    Antibody Screen NEG    Sample Expiration      08/23/2020,2359 Performed at [facility], [place] [address] [place], [place] [address]   I-Stat beta hCG blood, ED     Status: Abnormal   Collection Time: 08/20/20 10:39 AM  Result Value Ref Range   I-stat hCG, quantitative 6.6 (H) <5 mIU/mL   Comment 3            Comment:   GEST. AGE      CONC.  (mIU/mL)   <=1 WEEK        5 - 50     2 WEEKS       50 - 500     3 WEEKS       100 - 10,000     4 WEEKS     1,000 - 30,000        FEMALE AND NON-PREGNANT FEMALE:     LESS THAN 5 mIU/mL   I-stat chem 8, ED (not at [facility] or [facility])     Status: Abnormal   Collection Time: 08/20/20 10:40 AM  Result Value Ref Range   Sodium 135 135 - 145 mmol/L   Potassium 3.9 3.5 - 5.1 mmol/L   Chloride 101 98 - 111 mmol/L   BUN 14 6 - 20 mg/dL   Creatinine, Ser 0.60 0.44 - 1.00 mg/dL   Glucose, Bld 334 (H) 70 - 99 mg/dL    Comment: Glucose reference range applies only to samples taken after fasting for at least 8 hours.   Calcium, Ion 1.11 (L) 1.15 - 1.40 mmol/L   TCO2 21 (L) 22 - 32 mmol/L   Hemoglobin 13.6 12.0 - 15.0 g/dL   HCT 40.0 36 - 46 %  Respiratory Panel by RT PCR (Flu A&B, Covid) - Nasopharyngeal  Swab      Status: None   Collection Time: 08/20/20 11:47 AM   Specimen: Nasopharyngeal Swab  Result Value Ref Range   SARS Coronavirus 2 by RT PCR NEGATIVE NEGATIVE    Comment: (NOTE) SARS-CoV-2 target nucleic acids are NOT DETECTED.  The SARS-CoV-2 RNA is generally detectable in upper respiratoy specimens during the acute phase of infection. The lowest concentration of SARS-CoV-2 viral copies this assay can detect is 131 copies/mL. A negative result does not preclude SARS-Cov-2 infection and should not be used as the sole basis for treatment or other patient management decisions. A negative result may occur with  improper specimen collection/handling, submission of specimen other than nasopharyngeal swab, presence of viral mutation(s) within the areas targeted by this assay, and inadequate number of viral copies (<131 copies/mL). A negative result must be combined with clinical observations, patient history, and epidemiological information. The expected result is Negative.  Fact Sheet for Patients:  PinkCheek.be  Fact Sheet for Healthcare Providers:  GravelBags.it  This test is no t yet approved or cleared by the [place] FDA and  has been authorized for detection and/or diagnosis of SARS-CoV-2 by FDA under an Emergency Use Authorization (EUA). This EUA will remain  in effect (meaning this test can be used) for the duration of the COVID-19 declaration under Section 564(b)(1) of the Act, 21 U.S.C. section 360bbb-3(b)(1), unless the authorization is terminated or revoked sooner.     Influenza A by PCR NEGATIVE NEGATIVE   Influenza B by PCR NEGATIVE NEGATIVE    Comment: (NOTE) The Xpert Xpress SARS-CoV-2/FLU/RSV assay is intended as an aid in  the diagnosis of influenza from Nasopharyngeal swab specimens and  should not be used as a sole basis for treatment. Nasal washings and  aspirates are unacceptable for Xpert Xpress  SARS-CoV-2/FLU/RSV  testing.  Fact Sheet for Patients: PinkCheek.be  Fact Sheet for Healthcare Providers: GravelBags.it  This test is not yet approved or cleared by the [place] FDA and  has been authorized for detection and/or diagnosis of SARS-CoV-2 by  FDA under an Emergency Use Authorization (EUA). This EUA will remain  in effect (meaning this test can be used) for the duration of the  Covid-19 declaration under Section 564(b)(1) of the Act, 21  U.S.C. section 360bbb-3(b)(1), unless the authorization is  terminated or revoked. Performed at [facility], [place] [address] [place], [place] [address]   Hepatic function panel     Status: Abnormal   Collection Time: 08/20/20 12:23 PM  Result Value Ref Range   Total Protein 6.1 (L) 6.5 - 8.1 g/dL   Albumin 3.4 (L) 3.5 - 5.0 g/dL   AST 23 15 - 41 U/L   ALT 23 0 - 44 U/L   Alkaline Phosphatase 72 38 - 126 U/L   Total Bilirubin 0.9 0.3 - 1.2 mg/dL   Bilirubin, Direct 0.3 (H) 0.0 - 0.2 mg/dL   Indirect Bilirubin 0.6 0.3 - 0.9 mg/dL    Comment: Performed at [facility] [address], [place], [place] [address]  Lipase, blood     Status: Abnormal   Collection Time: 08/20/20 12:23 PM  Result Value Ref Range   Lipase 148 (H) 11 - 51 U/L    Comment: Performed at [facility], [place] [address], [place],  [address]  ABO/Rh     Status: None   Collection Time: 08/20/20  1:32 PM  Result Value Ref Range   ABO/RH(D)  A POS Performed at [facility], [place] [address] [place], [place] [address]   Troponin I (High Sensitivity)     Status: Abnormal   Collection Time: 08/20/20  1:32 PM  Result Value Ref Range   Troponin I (High Sensitivity) 1,529 (HH) <18 ng/L    Comment: CRITICAL VALUE NOTED.  VALUE IS CONSISTENT WITH PREVIOUSLY REPORTED AND CALLED VALUE. (NOTE) Elevated high sensitivity troponin I (hsTnI) values and significant  changes across  serial measurements may suggest ACS but many other  chronic and acute conditions are known to elevate hsTnI results.  Refer to the Links section for chest pain algorithms and additional  guidance. Performed at [facility], [place] [address], [place], [place] [address]   Hemoglobin A1c     Status: Abnormal   Collection Time: 08/20/20  2:21 PM  Result Value Ref Range   Hgb A1c MFr Bld 10.8 (H) 4.8 - 5.6 %    Comment: (NOTE)         Prediabetes: 5.7 - 6.4         Diabetes: >6.4         Glycemic control for adults with diabetes: <7.0    Mean Plasma Glucose 263 mg/dL    Comment: (NOTE) Performed At: [facility] [place], [place] [id number] Rush Farmer MD HQ:[id number]   Troponin I (High Sensitivity)     Status: Abnormal   Collection Time: 08/20/20  3:03 PM  Result Value Ref Range   Troponin I (High Sensitivity) 1,415 (HH) <18 ng/L    Comment: CRITICAL VALUE NOTED.  VALUE IS CONSISTENT WITH PREVIOUSLY REPORTED AND CALLED VALUE. (NOTE) Elevated high sensitivity troponin I (hsTnI) values and significant  changes across serial measurements may suggest ACS but many other  chronic and acute conditions are known to elevate hsTnI results.  Refer to the Links section for chest pain algorithms and additional  guidance. Performed at [facility], [place] [address] [place], [place] [address]   Magnesium     Status: Abnormal   Collection Time: 08/20/20  3:03 PM  Result Value Ref Range   Magnesium 1.5 (L) 1.7 - 2.4 mg/dL    Comment: SLIGHT HEMOLYSIS Performed at [place] [address], [place], [place] [address]   T4, free     Status: None   Collection Time: 08/20/20  3:47 PM  Result Value Ref Range   Free T4 0.75 0.61 - 1.12 ng/dL    Comment: (NOTE) Biotin ingestion may interfere with free T4 tests. If the results are inconsistent with the TSH level, previous test results, or the clinical presentation, then consider biotin interference. If  needed, order repeat testing after stopping biotin. Performed at [facility], [place] [address], [place], [place] [address]   TSH     Status: None   Collection Time: 08/20/20  3:47 PM  Result Value Ref Range   TSH 1.011 0.350 - 4.500 uIU/mL    Comment: Performed by a 3rd Generation assay with a functional sensitivity of <=0.01 uIU/mL. Performed at [facility], [place] [address] [place], [place] [address]   Magnesium     Status: Abnormal   Collection Time: 08/20/20  3:47 PM  Result Value Ref Range   Magnesium 1.3 (L) 1.7 - 2.4 mg/dL    Comment: Performed at [facility] [address], [place],  [address]  Lipid panel     Status: Abnormal   Collection  Time: 08/20/20  3:47 PM  Result Value Ref Range   Cholesterol 170 0 - 200 mg/dL   Triglycerides 153 (H) <150 mg/dL   HDL 38 (L) >40 mg/dL   Total CHOL/HDL Ratio 4.5 RATIO   VLDL 31 0 - 40 mg/dL   LDL Cholesterol 101 (H) 0 - 99 mg/dL    Comment:        Total Cholesterol/HDL:CHD Risk Coronary Heart Disease Risk Table                     Men   Women  1/2 Average Risk   3.4   3.3  Average Risk       5.0   4.4  2 X Average Risk   9.6   7.1  3 X Average Risk  23.4   11.0        Use the calculated Patient Ratio above and the CHD Risk Table to determine the patient's CHD Risk.        ATP III CLASSIFICATION (LDL):  <100     mg/dL   Optimal  100-129  mg/dL   Near or Above                    Optimal  130-159  mg/dL   Borderline  160-189  mg/dL   High  >190     mg/dL   Very High Performed at [facility] [address], [place], [place] [address]   HIV Antibody (routine testing w rflx)     Status: None   Collection Time: 08/20/20  6:13 PM  Result Value Ref Range   HIV Screen 4th Generation wRfx Non Reactive Non Reactive    Comment: Performed at [facility], [place] [address], [place], [place] [address]  Glucose, capillary     Status: Abnormal   Collection Time: 08/20/20  8:20 PM  Result Value  Ref Range   Glucose-Capillary 240 (H) 70 - 99 mg/dL    Comment: Glucose reference range applies only to samples taken after fasting for at least 8 hours.  Heparin level (unfractionated)     Status: Abnormal   Collection Time: 08/20/20  9:49 PM  Result Value Ref Range   Heparin Unfractionated <0.10 (L) 0.30 - 0.70 IU/mL    Comment: (NOTE) If heparin results are below expected values, and patient dosage has  been confirmed, suggest follow up testing of antithrombin III levels. Performed at [facility], [place] [address], [place], [place] [address]   Glucose, capillary     Status: Abnormal   Collection Time: 08/21/20 12:07 AM  Result Value Ref Range   Glucose-Capillary 224 (H) 70 - 99 mg/dL    Comment: Glucose reference range applies only to samples taken after fasting for at least 8 hours.  Glucose, capillary     Status: Abnormal   Collection Time: 08/21/20  4:28 AM  Result Value Ref Range   Glucose-Capillary 122 (H) 70 - 99 mg/dL    Comment: Glucose reference range applies only to samples taken after fasting for at least 8 hours.   Comment 1 Notify RN    Comment 2 Document in Chart   Basic metabolic panel     Status: Abnormal   Collection Time: 08/21/20  5:58 AM  Result Value Ref Range   Sodium 136 135 - 145 mmol/L   Potassium 3.6 3.5 - 5.1 mmol/L   Chloride 106 98 - 111 mmol/L   CO2  24 22 - 32 mmol/L   Glucose, Bld 103 (H) 70 - 99 mg/dL    Comment: Glucose reference range applies only to samples taken after fasting for at least 8 hours.   BUN 9 6 - 20 mg/dL   Creatinine, Ser 0.71 0.44 - 1.00 mg/dL   Calcium 8.6 (L) 8.9 - 10.3 mg/dL   GFR, Estimated >60 >60 mL/min    Comment: (NOTE) Calculated using the CKD-EPI Creatinine Equation (2021)    Anion gap 6 5 - 15    Comment: Performed at [facility] [address], [place], [place] [address]  CBC     Status: Abnormal   Collection Time: 08/21/20  5:58 AM  Result Value Ref Range   WBC 9.2 4.0 - 10.5 K/uL   RBC 4.02  3.87 - 5.11 MIL/uL   Hemoglobin 12.2 12.0 - 15.0 g/dL   HCT 35.7 (L) 36 - 46 %   MCV 88.8 80.0 - 100.0 fL   MCH 30.3 26.0 - 34.0 pg   MCHC 34.2 30.0 - 36.0 g/dL   RDW 13.9 11.5 - 15.5 %   Platelets 327 150 - 400 K/uL   nRBC 0.0 0.0 - 0.2 %    Comment: Performed at [facility], [place] [address] [place], [place] [address]  Heparin level (unfractionated)     Status: None   Collection Time: 08/21/20  5:58 AM  Result Value Ref Range   Heparin Unfractionated 0.47 0.30 - 0.70 IU/mL    Comment: (NOTE) If heparin results are below expected values, and patient dosage has  been confirmed, suggest follow up testing of antithrombin III levels. Performed at [facility], [place] [address], [place], [place] [address]   Protime-INR     Status: None   Collection Time: 08/21/20  5:58 AM  Result Value Ref Range   Prothrombin Time 13.8 11.4 - 15.2 seconds   INR 1.1 0.8 - 1.2    Comment: (NOTE) INR goal varies based on device and disease states. Performed at [facility], [place] [address], [place], [place] [address]   Lipid panel     Status: Abnormal   Collection Time: 08/21/20  5:58 AM  Result Value Ref Range   Cholesterol 135 0 - 200 mg/dL   Triglycerides 227 (H) <150 mg/dL   HDL 29 (L) >40 mg/dL   Total CHOL/HDL Ratio 4.7 RATIO   VLDL 45 (H) 0 - 40 mg/dL   LDL Cholesterol 61 0 - 99 mg/dL    Comment:        Total Cholesterol/HDL:CHD Risk Coronary Heart Disease Risk Table                     Men   Women  1/2 Average Risk   3.4   3.3  Average Risk       5.0   4.4  2 X Average Risk   9.6   7.1  3 X Average Risk  23.4   11.0        Use the calculated Patient Ratio above and the CHD Risk Table to determine the patient's CHD Risk.        ATP III CLASSIFICATION (LDL):  <100     mg/dL   Optimal  100-129  mg/dL   Near or Above                    Optimal  130-159  mg/dL   Borderline  160-189  mg/dL   High  >190     mg/dL   Very High Performed at [facility] [address], [place], [place] [address]   Magnesium     Status: None   Collection Time: 08/21/20  5:58 AM  Result Value Ref Range   Magnesium 1.7 1.7 - 2.4 mg/dL    Comment: Performed at [facility] [address], [place], [place] [address]  Glucose, capillary     Status: Abnormal   Collection Time: 08/21/20  8:36 AM  Result Value Ref Range   Glucose-Capillary 102 (H) 70 - 99 mg/dL    Comment: Glucose reference range applies only to samples taken after fasting for at least 8 hours.  Glucose, capillary     Status: None   Collection Time: 08/21/20 11:34 AM  Result Value Ref Range   Glucose-Capillary 76 70 - 99 mg/dL    Comment: Glucose reference range applies only to samples taken after fasting for at least 8 hours.   Comment 1 Notify RN   Heparin level (unfractionated)     Status: Abnormal   Collection Time: 08/21/20  3:55 PM  Result Value Ref Range   Heparin Unfractionated 0.22 (L) 0.30 - 0.70 IU/mL    Comment: (NOTE) If heparin results are below expected values, and patient dosage has  been confirmed, suggest follow up testing of antithrombin III levels. Performed at [facility], [place] [address], [place], [place] [address]   Glucose, capillary     Status: None   Collection Time: 08/21/20  7:42 PM  Result Value Ref Range   Glucose-Capillary 70 70 - 99 mg/dL    Comment: Glucose reference range applies only to samples taken after fasting for at least 8 hours.  Glucose, capillary     Status: None   Collection Time: 08/21/20  9:16 PM  Result Value Ref Range   Glucose-Capillary 79 70 - 99 mg/dL    Comment: Glucose reference range applies only to samples taken after fasting for at least 8 hours.  Glucose, capillary     Status: Abnormal   Collection Time: 08/22/20 12:13 AM  Result Value Ref Range   Glucose-Capillary 59 (L) 70 - 99 mg/dL    Comment: Glucose reference range applies only to samples taken after fasting for at least 8 hours.  Heparin level (unfractionated)      Status: None   Collection Time: 08/22/20 12:16 AM  Result Value Ref Range   Heparin Unfractionated 0.50 0.30 - 0.70 IU/mL    Comment: (NOTE) If heparin results are below expected values, and patient dosage has  been confirmed, suggest follow up testing of antithrombin III levels. Performed at [facility], [place] [address], [place], [place] [address]   Lipase, blood     Status: Abnormal   Collection Time: 08/22/20 12:16 AM  Result Value Ref Range   Lipase 57 (H) 11 - 51 U/L    Comment: Performed at [facility] [address] [place], [place] [address]  Basic metabolic panel     Status: Abnormal   Collection Time: 08/22/20 12:16 AM  Result Value Ref Range   Sodium 137 135 - 145 mmol/L   Potassium 3.3 (L) 3.5 - 5.1 mmol/L   Chloride 106 98 - 111 mmol/L   CO2 22 22 - 32 mmol/L   Glucose, Bld 68 (L) 70 - 99 mg/dL    Comment: Glucose reference range applies only to samples  taken after fasting for at least 8 hours.   BUN 6 6 - 20 mg/dL   Creatinine, Ser 0.66 0.44 - 1.00 mg/dL   Calcium 8.6 (L) 8.9 - 10.3 mg/dL   GFR, Estimated >60 >60 mL/min    Comment: (NOTE) Calculated using the CKD-EPI Creatinine Equation (2021)    Anion gap 9 5 - 15    Comment: Performed at [facility] [address] [place], [place] [address]  Heparin level (unfractionated)     Status: None   Collection Time: 08/22/20 12:16 AM  Result Value Ref Range   Heparin Unfractionated 0.43 0.30 - 0.70 IU/mL    Comment: (NOTE) If heparin results are below expected values, and patient dosage has  been confirmed, suggest follow up testing of antithrombin III levels. Performed at [facility], [place] [address] [place], [place] [address]   CBC     Status: Abnormal   Collection Time: 08/22/20 12:16 AM  Result Value Ref Range   WBC 6.3 4.0 - 10.5 K/uL   RBC 3.53 (L) 3.87 - 5.11 MIL/uL   Hemoglobin 10.7 (L) 12.0 - 15.0 g/dL   HCT 31.5 (L) 36 - 46 %   MCV 89.2 80.0 - 100.0 fL   MCH 30.3  26.0 - 34.0 pg   MCHC 34.0 30.0 - 36.0 g/dL   RDW 14.0 11.5 - 15.5 %   Platelets 263 150 - 400 K/uL   nRBC 0.0 0.0 - 0.2 %    Comment: Performed at [facility], [place] [address] [place], [place] [address]  Glucose, capillary     Status: Abnormal   Collection Time: 08/22/20 12:41 AM  Result Value Ref Range   Glucose-Capillary 126 (H) 70 - 99 mg/dL    Comment: Glucose reference range applies only to samples taken after fasting for at least 8 hours.  Glucose, capillary     Status: Abnormal   Collection Time: 08/22/20  4:00 AM  Result Value Ref Range   Glucose-Capillary 66 (L) 70 - 99 mg/dL    Comment: Glucose reference range applies only to samples taken after fasting for at least 8 hours.   Comment 1 Notify RN    Comment 2 Document in Chart   Glucose, capillary     Status: Abnormal   Collection Time: 08/22/20  4:39 AM  Result Value Ref Range   Glucose-Capillary 141 (H) 70 - 99 mg/dL    Comment: Glucose reference range applies only to samples taken after fasting for at least 8 hours.  Glucose, capillary     Status: Abnormal   Collection Time: 08/22/20  7:43 AM  Result Value Ref Range   Glucose-Capillary 123 (H) 70 - 99 mg/dL    Comment: Glucose reference range applies only to samples taken after fasting for at least 8 hours.    ECG   Normal sinus rhythm at 76, no ischemic changes - Personally Reviewed  Telemetry   Sinus rhythm - Personally Reviewed  Radiology    CT CARDIAC SCORING  Addendum Date: 08/21/2020   ADDENDUM REPORT: 08/21/2020 18:20 EXAM: OVER-READ INTERPRETATION  CT CHEST The following report is an over-read performed by radiologist Dr. Alvino Blood [facility] on 08/21/2020. This over-read does not include interpretation of cardiac or coronary anatomy or pathology. The calcium score interpretation by the cardiologist is attached. COMPARISON:  CT chest 08/20/2020 FINDINGS: Limited view of the lung parenchyma demonstrates no suspicious  nodularity. Airways are normal. Limited view of the mediastinum  demonstrates no adenopathy. Esophagus normal. Limited view of the upper abdomen unremarkable. Limited view of the skeleton and chest wall is unremarkable. IMPRESSION: No significant extracardiac findings. Electronically Signed   By: Suzy Bouchard M.D.   On: 08/21/2020 18:20   Result Date: 08/21/2020 CLINICAL DATA:  Risk stratification EXAM: Coronary Calcium Score TECHNIQUE: The patient was scanned on a [employer]. Axial non-contrast 3 mm slices were carried out through the heart. The data set was analyzed on a dedicated work station and scored using the [place]. FINDINGS: Non-cardiac: See separate report from [facility]. Ascending Aorta: Normal caliber.  Atherosclerosis. Pericardium: Normal Coronary arteries: Normal origins.  Multivessel CAC. IMPRESSION: Coronary calcium score of 35. This was 94th percentile for age and sex matched control. Electronically Signed: By: Pixie Casino M.D. On: 08/21/2020 16:27   DG Chest Portable 1 View  Result Date: 08/20/2020 CLINICAL DATA:  Chest pain EXAM: PORTABLE CHEST 1 VIEW COMPARISON:  Same-day CT FINDINGS: The heart size and mediastinal contours are within normal limits. No focal airspace consolidation, pleural effusion, or pneumothorax. The visualized skeletal structures are unremarkable. IMPRESSION: No active disease. Electronically Signed   By: Davina Poke D.O.   On: 08/20/2020 14:55   ECHOCARDIOGRAM COMPLETE  Result Date: 08/21/2020    ECHOCARDIOGRAM REPORT   Patient Name:   Ariana Herrera Date of Exam: 08/21/2020 Medical Rec #:  [id number]     Height:       62.0 in Accession #:    [id number]    Weight:       160.2 lb Date of Birth:  [birth year]    BSA:          1.740 m Patient Age:    79 years      BP:           125/70 mmHg Patient Gender: F             HR:           90 bpm. Exam Location:  Inpatient Procedure: 2D Echo, Color Doppler and Cardiac Doppler  Indications:    NSTEMI  History:        Patient has no prior history of Echocardiogram examinations.                 Risk Factors:Diabetes.  Sonographer:    Raquel Sarna Senior RDCS Referring Phys: Isaiah Serge IMPRESSIONS  1. Left ventricular ejection fraction, by estimation, is 60 to 65%. The left ventricle has normal function. The left ventricle has no regional wall motion abnormalities. Left ventricular diastolic parameters were normal.  2. Right ventricular systolic function is normal. The right ventricular size is normal. Tricuspid regurgitation signal is inadequate for assessing PA pressure.  3. The mitral valve is normal in structure. Trivial mitral valve regurgitation. No evidence of mitral stenosis.  4. The aortic valve is tricuspid. Aortic valve regurgitation is not visualized. No aortic stenosis is present.  5. The inferior vena cava is normal in size with greater than 50% respiratory variability, suggesting right atrial pressure of 3 mmHg. FINDINGS  Left Ventricle: Left ventricular ejection fraction, by estimation, is 60 to 65%. The left ventricle has normal function. The left ventricle has no regional wall motion abnormalities. The left ventricular internal cavity size was normal in size. There is  no left ventricular hypertrophy. Left ventricular diastolic parameters were normal. Right Ventricle: The right ventricular size is normal. No increase in right ventricular wall thickness. Right ventricular systolic function is normal. Tricuspid regurgitation  signal is inadequate for assessing PA pressure. Left Atrium: Left atrial size was normal in size. Right Atrium: Right atrial size was normal in size. Pericardium: There is no evidence of pericardial effusion. Mitral Valve: The mitral valve is normal in structure. Trivial mitral valve regurgitation. No evidence of mitral valve stenosis. Tricuspid Valve: The tricuspid valve is normal in structure. Tricuspid valve regurgitation is not demonstrated. Aortic Valve:  The aortic valve is tricuspid. Aortic valve regurgitation is not visualized. No aortic stenosis is present. Pulmonic Valve: The pulmonic valve was normal in structure. Pulmonic valve regurgitation is not visualized. Aorta: The aortic root is normal in size and structure. Venous: The inferior vena cava is normal in size with greater than 50% respiratory variability, suggesting right atrial pressure of 3 mmHg. IAS/Shunts: No atrial level shunt detected by color flow Doppler.  LEFT VENTRICLE PLAX 2D LVIDd:         4.00 cm  Diastology LVIDs:         2.10 cm  LV e' medial:    8.16 cm/s LV PW:         0.60 cm  LV E/e' medial:  9.9 LV IVS:        0.90 cm  LV e' lateral:   9.03 cm/s LVOT diam:     2.00 cm  LV E/e' lateral: 9.0 LV SV:         62 LV SV Index:   36 LVOT Area:     3.14 cm  RIGHT VENTRICLE RV S prime:     9.57 cm/s TAPSE (M-mode): 1.7 cm LEFT ATRIUM             Index       RIGHT ATRIUM           Index LA diam:        2.50 cm 1.44 cm/m  RA Area:     11.90 cm LA Vol (A2C):   38.1 ml 21.90 ml/m RA Volume:   26.80 ml  15.41 ml/m LA Vol (A4C):   28.3 ml 16.27 ml/m LA Biplane Vol: 32.8 ml 18.85 ml/m  AORTIC VALVE LVOT Vmax:   102.00 cm/s LVOT Vmean:  75.900 cm/s LVOT VTI:    0.197 m  AORTA Ao Root diam: 3.00 cm Ao Asc diam:  3.20 cm MITRAL VALVE MV Area (PHT): 3.21 cm    SHUNTS MV Decel Time: 236 msec    Systemic VTI:  0.20 m MV E velocity: 81.00 cm/s  Systemic Diam: 2.00 cm MV A velocity: 68.60 cm/s MV E/A ratio:  1.18 Loralie Champagne MD Electronically signed by Loralie Champagne MD Signature Date/Time: 08/21/2020/2:50:07 PM    Final    CT Angio Chest/Abd/Pel for Dissection W and/or Wo Contrast  Result Date: 08/20/2020 CLINICAL DATA:  Severe epigastric pain, abdominal pain. History of left breast cancer treated with lumpectomy, chemotherapy, and radiation in September of 2019 EXAM: CT ANGIOGRAPHY CHEST, ABDOMEN AND PELVIS TECHNIQUE: Non-contrast CT of the chest was initially obtained. Multidetector CT imaging  through the chest, abdomen and pelvis was performed using the standard protocol during bolus administration of intravenous contrast. Multiplanar reconstructed images and MIPs were obtained and reviewed to evaluate the vascular anatomy. CONTRAST:  133mL OMNIPAQUE IOHEXOL 350 MG/ML SOLN COMPARISON:  None. FINDINGS: CTA CHEST FINDINGS Cardiovascular: Initial noncontrast imaging of the chest demonstrates no evidence for aortic intramural hematoma. Arterial phase postcontrast imaging demonstrates no evidence of aortic aneurysm or dissection. Four vessel arch with the left vertebral artery arising directly from  the arch, an anatomic variant. There is scattered atherosclerotic calcification of the aorta. There is an 11 x 5 mm area of noncalcified atherosclerotic plaque with a slightly polypoid configuration projecting into the lumen of the distal descending thoracic aorta (series 12, image 93). Central pulmonary vasculature is within normal limits. No central filling defect. Normal heart size. No pericardial effusion. Mediastinum/Nodes: No axillary, mediastinal, or hilar lymphadenopathy. Thyroid and trachea within normal limits. Small sliding-type hiatal hernia. Esophagus otherwise within normal limits. Lungs/Pleura: No focal airspace consolidation. No suspicious pulmonary nodules or masses. No pleural effusion or pneumothorax. Musculoskeletal: There is a somewhat mottled appearance of the left third rib anteriorly (series 7, image 34) with some scarring in the adjacent lung, potentially representing post radiation changes. Osseous structures of the bony thorax appear otherwise within normal limits without suspicious bony lesion. Skin thickening of the left breast with irregularly marginated density within the inner aspect of the breast abutting the pectoralis musculature measuring approximately 2.3 x 1.8 x 2.2 cm (series 7, image 44; series 12, image 116), which may represent patient's lumpectomy site. Review of the MIP  images confirms the above findings. CTA ABDOMEN AND PELVIS FINDINGS VASCULAR Aorta: Normal caliber aorta without aneurysm, dissection, vasculitis or significant stenosis. Mild calcified and noncalcified atherosclerotic plaque. Celiac: Patent without evidence of aneurysm, dissection, vasculitis or significant stenosis. SMA: Patent without evidence of aneurysm, dissection, vasculitis or significant stenosis. Renals: Both renal arteries are patent without evidence of aneurysm, dissection, vasculitis, fibromuscular dysplasia or significant stenosis. IMA: Patent. Inflow: Patent without evidence of aneurysm, dissection, vasculitis or significant stenosis. Mild calcified and noncalcified atherosclerotic plaque. Veins: No obvious venous abnormality within the limitations of this arterial phase study. Review of the MIP images confirms the above findings. NON-VASCULAR Hepatobiliary: Diffusely decreased attenuation of the hepatic parenchyma. No focal hepatic lesion is identified on arterial phase imaging. Status post cholecystectomy. No biliary dilatation. Pancreas: There is peripancreatic fat stranding around the pancreatic head and proximal body (series 7, image 93). No pancreatic ductal dilatation. No evidence of parenchymal non enhancement. No peripancreatic fluid collection. Spleen: 1.8 x 0.9 cm rounded area of hypoattenuation within the anterior aspect of the spleen (series 7, image 84; series 12, image 160) measuring greater than fluid density. Spleen is otherwise within normal limits. There are 2 small splenules in the region of the splenic hilum. Adrenals/Urinary Tract: No adrenal nodules or masses. There are wedge-shaped areas of decreased enhancement within the inferior pole of the right kidney (series 10, images 117 and 121). Possible tiny area of hypoattenuating cortex in the inferior pole of the left kidney (series 10, image 119). Kidneys have otherwise symmetric enhancement. No enhancing renal mass, stone, or  hydronephrosis. Urinary bladder unremarkable. Stomach/Bowel: Small hiatal hernia. Stomach is otherwise within normal limits. Appendix appears normal (series 7, image 159). No evidence of bowel wall thickening, distention, or inflammatory changes. Moderate volume stool within the colon. Lymphatic: No abdominopelvic lymphadenopathy. Reproductive: Uterus and bilateral adnexa are unremarkable. Other: No free fluid. No abdominopelvic fluid collection. No pneumoperitoneum. No abdominal wall hernia. Musculoskeletal: No acute or significant osseous findings. Review of the MIP images confirms the above findings. IMPRESSION: Vascular findings: 1. Negative for aortic aneurysm or dissection. 2. Small wedge-shaped areas of decreased enhancement within the inferior poles of the right kidney and left kidney, concerning for areas of focal renal infarction. Pyelonephritis could have a similar appearance. Correlation with urinalysis is recommended. 3. Focal 11 x 5 mm area of noncalcified atherosclerotic plaque with a slightly polypoid configuration  projecting into the lumen of the distal descending thoracic aorta. This may be at risk for potential plaque thrombosis. Nonvascular findings: 1. Findings of acute uncomplicated pancreatitis. No evidence of pancreatic necrosis or peripancreatic fluid collection. Correlate with serum lipase. 2. Findings suggestive of hepatic steatosis. 3. Indeterminate 1.8 cm rounded area of hypoattenuation within the anterior aspect of the spleen. Further evaluation with contrast-enhanced MRI can be performed on a nonemergent basis. 4. Slightly mottled appearance of the left third rib anteriorly with some adjacent scarring in the adjacent lung, potentially representing post radiation changes. 5. Left breast skin thickening with suspected lumpectomy site at the inner left breast. Continued mammographic follow-up is recommended. Electronically Signed   By: Davina Poke D.O.   On: 08/20/2020 11:40   US  Abdomen Limited RUQ (LIVER/GB)  Result Date: 08/20/2020 CLINICAL DATA:  Pancreatitis.  Right upper quadrant pain.  Nausea. EXAM: ULTRASOUND ABDOMEN LIMITED RIGHT UPPER QUADRANT COMPARISON:  Dissection protocol CT earlier today. FINDINGS: Gallbladder: Surgically absent. Common bile duct: Diameter: 4 mm, nondilated. Liver: Heterogeneously increased in parenchymal echogenicity. Liver parenchyma is difficult to penetrate. No focal abnormalities seen by ultrasound. Portal vein is patent on color Doppler imaging with normal direction of blood flow towards the liver. Other: No right upper quadrant ascites. IMPRESSION: 1. Post cholecystectomy without biliary dilatation. 2. Heterogeneous hepatic parenchyma which is difficult to penetrate, typical of steatosis. Electronically Signed   By: Keith Rake M.D.   On: 08/20/2020 19:46    Cardiac Studies   Echo as above, calcium score  Assessment   Principal Problem:   NSTEMI (non-ST elevated myocardial infarction) ([place]) Active Problems:   Breast cancer (HCC)   Pancreatitis, acute   Bipolar disease, chronic (HCC)   Diabetes mellitus type 2 in obese (HCC)   Class 1 obesity due to excess calories with body mass index (BMI) of 31.0 to 31.9 in adult   Plan   1. Normal echo but significantly elevated troponin -still suspect troponin elevation due to pancreatitis, likely worsened by diabetes medication. However, there is disproportionate coronary calcium, she is a smoker and had known non-obstructive CAD with similar presentation in the past for NSTEMI, but was not stented. Will proceed with coronary angiogram today.  Time Spent Directly with Patient:  I have spent a total of 25 minutes with the patient reviewing hospital notes, telemetry, EKGs, labs and examining the patient as well as establishing an assessment and plan that was discussed personally with the patient.  > 50% of time was spent in direct patient care.  Length of Stay:  LOS: 2 days    Pixie Casino, MD, [facility], [place] Director of the [facility] Diplomate of the American Board of Clinical Lipidology Attending Cardiologist  Direct Dial: [phone]  Fax: [phone]  Website:  [email or web address] Ericca Labra 08/22/2020, 9:00 AM

## 2020-08-22 NOTE — Progress Notes (Signed)
DAILY PROGRESS NOTE   Patient Name: Ariana Herrera Date of Encounter: 08/22/2020 Cardiologist: Pixie Casino, MD  Chief Complaint   Midepigastric pain  Patient Profile   Ariana Herrera is a 54 y.o. female who is being seen today for the evaluation of epigastric pain at the request of DR. Wentz.  Ariana Herrera is a 54 y.o. 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 Did get a calcium score which was 68, 94th percentile for age and sex matched control. No significant extracardiac findings. There was left main and LAD calcification. Echo was normal. I don't suspect obstructive CAD, however, cannot exclude it - will need to proceed with cath.  Objective   Vitals:   08/22/20 0011 08/22/20 0228 08/22/20 0357 08/22/20 0742  BP: 99/60  110/65 111/63  Pulse: 90  93 91  Resp: 20  18 16   Temp: 98.2 F (36.8 C)  99 F (37.2 C) 99.3 F (37.4 C)  TempSrc: Oral  Oral Oral  SpO2: 93%  97% 93%  Weight:  74.3 kg    Height:        Intake/Output Summary (Last 24 hours) at 08/22/2020 0900 Last data filed at 08/22/2020 4193 Gross per 24 hour  Intake 1461.11 ml  Output 1700 ml  Net -238.89 ml   Filed Weights   08/20/20 1500 08/21/20 0427 08/22/20 0228  Weight: 78.2 kg 72.7 kg 74.3 kg    Physical Exam   General appearance: alert and no distress Neck: no carotid bruit, no JVD and thyroid not enlarged, symmetric, no tenderness/mass/nodules Lungs: clear to auscultation bilaterally Heart: regular rate and rhythm Abdomen: moderate TTP over the upper mid-epigastrium Extremities: extremities normal, atraumatic, no cyanosis or  edema Pulses: 2+ and symmetric Skin: Skin color, texture, turgor normal. No rashes or lesions Neurologic: Grossly normal Psych: Pleasant  Inpatient Medications    Scheduled Meds: . aspirin EC  81 mg Oral Daily  . atorvastatin  40 mg Oral Daily  . insulin aspart  0-15 Units Subcutaneous TID WC  . insulin glargine  6 Units Subcutaneous QHS  . metoprolol tartrate  12.5 mg Oral BID  . nicotine  21 mg Transdermal Daily  . pantoprazole (PROTONIX) IV  40 mg Intravenous Q12H  . QUEtiapine  800 mg Oral QHS  . sodium chloride flush  3 mL Intravenous Q12H  . sodium chloride flush  3 mL Intravenous Q12H    Continuous Infusions: . sodium chloride    . sodium chloride    . heparin 1,250 Units/hr (08/21/20 1821)  . potassium chloride 10 mEq (08/22/20 0758)    PRN Meds: sodium chloride, sodium chloride, acetaminophen, HYDROmorphone (DILAUDID) injection, nitroGLYCERIN, ondansetron (ZOFRAN) IV, sodium chloride flush, sodium chloride flush   Labs   Results for orders placed or performed during the hospital encounter of 08/20/20 (from the past 48 hour(s))  CBC     Status: Abnormal   Collection Time: 08/20/20 10:27 AM  Result Value Ref Range   WBC 12.8 (H) 4.0 - 10.5 K/uL   RBC 4.29 3.87 - 5.11 MIL/uL   Hemoglobin 13.2 12.0 - 15.0 g/dL   HCT  38.3 36 - 46 %   MCV 89.3 80.0 - 100.0 fL   MCH 30.8 26.0 - 34.0 pg   MCHC 34.5 30.0 - 36.0 g/dL   RDW 13.8 11.5 - 15.5 %   Platelets 408 (H) 150 - 400 K/uL   nRBC 0.0 0.0 - 0.2 %    Comment: Performed at [facility] [address], [place], [place] [address]  Troponin I (High Sensitivity)     Status: Abnormal   Collection Time: 08/20/20 10:27 AM  Result Value Ref Range   Troponin I (High Sensitivity) 1,534 (HH) <18 ng/L    Comment: CRITICAL RESULT CALLED TO, READ BACK BY AND VERIFIED WITH: k cobb,rn 08/20/2020 1135 wilderk (NOTE) Elevated high sensitivity troponin I (hsTnI) values and significant  changes across serial measurements may  suggest ACS but many other  chronic and acute conditions are known to elevate hsTnI results.  Refer to the Links section for chest pain algorithms and additional  guidance. Performed at [facility], [place] [address] [place], [place] [address]   Protime-INR (order if Patient is taking Coumadin / Warfarin)     Status: None   Collection Time: 08/20/20 10:27 AM  Result Value Ref Range   Prothrombin Time 13.2 11.4 - 15.2 seconds   INR 1.0 0.8 - 1.2    Comment: (NOTE) INR goal varies based on device and disease states. Performed at [facility], [place] [address], [place], [place] [address]   Type and screen [place]     Status: None   Collection Time: 08/20/20 10:27 AM  Result Value Ref Range   ABO/RH(D) A POS    Antibody Screen NEG    Sample Expiration      08/23/2020,2359 Performed at [facility], [place] [address] [place], [place] [address]   I-Stat beta hCG blood, ED     Status: Abnormal   Collection Time: 08/20/20 10:39 AM  Result Value Ref Range   I-stat hCG, quantitative 6.6 (H) <5 mIU/mL   Comment 3            Comment:   GEST. AGE      CONC.  (mIU/mL)   <=1 WEEK        5 - 50     2 WEEKS       50 - 500     3 WEEKS       100 - 10,000     4 WEEKS     1,000 - 30,000        FEMALE AND NON-PREGNANT FEMALE:     LESS THAN 5 mIU/mL   I-stat chem 8, ED (not at [facility] or [facility])     Status: Abnormal   Collection Time: 08/20/20 10:40 AM  Result Value Ref Range   Sodium 135 135 - 145 mmol/L   Potassium 3.9 3.5 - 5.1 mmol/L   Chloride 101 98 - 111 mmol/L   BUN 14 6 - 20 mg/dL   Creatinine, Ser 0.60 0.44 - 1.00 mg/dL   Glucose, Bld 334 (H) 70 - 99 mg/dL    Comment: Glucose reference range applies only to samples taken after fasting for at least 8 hours.   Calcium, Ion 1.11 (L) 1.15 - 1.40 mmol/L   TCO2 21 (L) 22 - 32 mmol/L   Hemoglobin 13.6 12.0 - 15.0 g/dL   HCT 40.0 36 - 46 %  Respiratory Panel by RT PCR (Flu A&B, Covid) - Nasopharyngeal  Swab      Status: None   Collection Time: 08/20/20 11:47 AM   Specimen: Nasopharyngeal Swab  Result Value Ref Range   SARS Coronavirus 2 by RT PCR NEGATIVE NEGATIVE    Comment: (NOTE) SARS-CoV-2 target nucleic acids are NOT DETECTED.  The SARS-CoV-2 RNA is generally detectable in upper respiratoy specimens during the acute phase of infection. The lowest concentration of SARS-CoV-2 viral copies this assay can detect is 131 copies/mL. A negative result does not preclude SARS-Cov-2 infection and should not be used as the sole basis for treatment or other patient management decisions. A negative result may occur with  improper specimen collection/handling, submission of specimen other than nasopharyngeal swab, presence of viral mutation(s) within the areas targeted by this assay, and inadequate number of viral copies (<131 copies/mL). A negative result must be combined with clinical observations, patient history, and epidemiological information. The expected result is Negative.  Fact Sheet for Patients:  PinkCheek.be  Fact Sheet for Healthcare Providers:  GravelBags.it  This test is no t yet approved or cleared by the [place] FDA and  has been authorized for detection and/or diagnosis of SARS-CoV-2 by FDA under an Emergency Use Authorization (EUA). This EUA will remain  in effect (meaning this test can be used) for the duration of the COVID-19 declaration under Section 564(b)(1) of the Act, 21 U.S.C. section 360bbb-3(b)(1), unless the authorization is terminated or revoked sooner.     Influenza A by PCR NEGATIVE NEGATIVE   Influenza B by PCR NEGATIVE NEGATIVE    Comment: (NOTE) The Xpert Xpress SARS-CoV-2/FLU/RSV assay is intended as an aid in  the diagnosis of influenza from Nasopharyngeal swab specimens and  should not be used as a sole basis for treatment. Nasal washings and  aspirates are unacceptable for Xpert Xpress  SARS-CoV-2/FLU/RSV  testing.  Fact Sheet for Patients: PinkCheek.be  Fact Sheet for Healthcare Providers: GravelBags.it  This test is not yet approved or cleared by the [place] FDA and  has been authorized for detection and/or diagnosis of SARS-CoV-2 by  FDA under an Emergency Use Authorization (EUA). This EUA will remain  in effect (meaning this test can be used) for the duration of the  Covid-19 declaration under Section 564(b)(1) of the Act, 21  U.S.C. section 360bbb-3(b)(1), unless the authorization is  terminated or revoked. Performed at [facility], [place] [address], [place], [place] [address]   Hepatic function panel     Status: Abnormal   Collection Time: 08/20/20 12:23 PM  Result Value Ref Range   Total Protein 6.1 (L) 6.5 - 8.1 g/dL   Albumin 3.4 (L) 3.5 - 5.0 g/dL   AST 23 15 - 41 U/L   ALT 23 0 - 44 U/L   Alkaline Phosphatase 72 38 - 126 U/L   Total Bilirubin 0.9 0.3 - 1.2 mg/dL   Bilirubin, Direct 0.3 (H) 0.0 - 0.2 mg/dL   Indirect Bilirubin 0.6 0.3 - 0.9 mg/dL    Comment: Performed at [facility] [address], [place], [place] [address]  Lipase, blood     Status: Abnormal   Collection Time: 08/20/20 12:23 PM  Result Value Ref Range   Lipase 148 (H) 11 - 51 U/L    Comment: Performed at [facility], [place] [address], [place], [place] [address]  ABO/Rh     Status: None   Collection Time: 08/20/20  1:32 PM  Result Value Ref Range   ABO/RH(D)  A POS Performed at [facility], [place] [address] [place], [place] [address]   Troponin I (High Sensitivity)     Status: Abnormal   Collection Time: 08/20/20  1:32 PM  Result Value Ref Range   Troponin I (High Sensitivity) 1,529 (HH) <18 ng/L    Comment: CRITICAL VALUE NOTED.  VALUE IS CONSISTENT WITH PREVIOUSLY REPORTED AND CALLED VALUE. (NOTE) Elevated high sensitivity troponin I (hsTnI) values and significant  changes across  serial measurements may suggest ACS but many other  chronic and acute conditions are known to elevate hsTnI results.  Refer to the Links section for chest pain algorithms and additional  guidance. Performed at [facility], [place] [address], [place], [place] [address]   Hemoglobin A1c     Status: Abnormal   Collection Time: 08/20/20  2:21 PM  Result Value Ref Range   Hgb A1c MFr Bld 10.8 (H) 4.8 - 5.6 %    Comment: (NOTE)         Prediabetes: 5.7 - 6.4         Diabetes: >6.4         Glycemic control for adults with diabetes: <7.0    Mean Plasma Glucose 263 mg/dL    Comment: (NOTE) Performed At: [facility] [place], [place] [id number] Rush Farmer MD [id number]   Troponin I (High Sensitivity)     Status: Abnormal   Collection Time: 08/20/20  3:03 PM  Result Value Ref Range   Troponin I (High Sensitivity) 1,415 (HH) <18 ng/L    Comment: CRITICAL VALUE NOTED.  VALUE IS CONSISTENT WITH PREVIOUSLY REPORTED AND CALLED VALUE. (NOTE) Elevated high sensitivity troponin I (hsTnI) values and significant  changes across serial measurements may suggest ACS but many other  chronic and acute conditions are known to elevate hsTnI results.  Refer to the Links section for chest pain algorithms and additional  guidance. Performed at [facility], [place] [address] [place], [place] [address]   Magnesium     Status: Abnormal   Collection Time: 08/20/20  3:03 PM  Result Value Ref Range   Magnesium 1.5 (L) 1.7 - 2.4 mg/dL    Comment: SLIGHT HEMOLYSIS Performed at [facility] [address] [place], [place] [address]   T4, free     Status: None   Collection Time: 08/20/20  3:47 PM  Result Value Ref Range   Free T4 0.75 0.61 - 1.12 ng/dL    Comment: (NOTE) Biotin ingestion may interfere with free T4 tests. If the results are inconsistent with the TSH level, previous test results, or the clinical presentation, then consider biotin interference. If  needed, order repeat testing after stopping biotin. Performed at [facility], [place] [address], [place], [place] [address]   TSH     Status: None   Collection Time: 08/20/20  3:47 PM  Result Value Ref Range   TSH 1.011 0.350 - 4.500 uIU/mL    Comment: Performed by a 3rd Generation assay with a functional sensitivity of <=0.01 uIU/mL. Performed at [facility], [place] [address], [place], [place] [address]   Magnesium     Status: Abnormal   Collection Time: 08/20/20  3:47 PM  Result Value Ref Range   Magnesium 1.3 (L) 1.7 - 2.4 mg/dL    Comment: Performed at [facility] [address], [place], [place] [address]  Lipid panel     Status: Abnormal   Collection  Time: 08/20/20  3:47 PM  Result Value Ref Range   Cholesterol 170 0 - 200 mg/dL   Triglycerides 153 (H) <150 mg/dL   HDL 38 (L) >40 mg/dL   Total CHOL/HDL Ratio 4.5 RATIO   VLDL 31 0 - 40 mg/dL   LDL Cholesterol 101 (H) 0 - 99 mg/dL    Comment:        Total Cholesterol/HDL:CHD Risk Coronary Heart Disease Risk Table                     Men   Women  1/2 Average Risk   3.4   3.3  Average Risk       5.0   4.4  2 X Average Risk   9.6   7.1  3 X Average Risk  23.4   11.0        Use the calculated Patient Ratio above and the CHD Risk Table to determine the patient's CHD Risk.        ATP III CLASSIFICATION (LDL):  <100     mg/dL   Optimal  100-129  mg/dL   Near or Above                    Optimal  130-159  mg/dL   Borderline  160-189  mg/dL   High  >190     mg/dL   Very High Performed at [facility] [address], [place], [place] [address]   HIV Antibody (routine testing w rflx)     Status: None   Collection Time: 08/20/20  6:13 PM  Result Value Ref Range   HIV Screen 4th Generation wRfx Non Reactive Non Reactive    Comment: Performed at [facility], [place] [address] [place], [place] [address]  Glucose, capillary     Status: Abnormal   Collection Time: 08/20/20  8:20 PM  Result Value  Ref Range   Glucose-Capillary 240 (H) 70 - 99 mg/dL    Comment: Glucose reference range applies only to samples taken after fasting for at least 8 hours.  Heparin level (unfractionated)     Status: Abnormal   Collection Time: 08/20/20  9:49 PM  Result Value Ref Range   Heparin Unfractionated <0.10 (L) 0.30 - 0.70 IU/mL    Comment: (NOTE) If heparin results are below expected values, and patient dosage has  been confirmed, suggest follow up testing of antithrombin III levels. Performed at [facility], [place] [address] [place], [place] [address]   Glucose, capillary     Status: Abnormal   Collection Time: 08/21/20 12:07 AM  Result Value Ref Range   Glucose-Capillary 224 (H) 70 - 99 mg/dL    Comment: Glucose reference range applies only to samples taken after fasting for at least 8 hours.  Glucose, capillary     Status: Abnormal   Collection Time: 08/21/20  4:28 AM  Result Value Ref Range   Glucose-Capillary 122 (H) 70 - 99 mg/dL    Comment: Glucose reference range applies only to samples taken after fasting for at least 8 hours.   Comment 1 Notify RN    Comment 2 Document in Chart   Basic metabolic panel     Status: Abnormal   Collection Time: 08/21/20  5:58 AM  Result Value Ref Range   Sodium 136 135 - 145 mmol/L   Potassium 3.6 3.5 - 5.1 mmol/L   Chloride 106 98 - 111 mmol/L   CO2  24 22 - 32 mmol/L   Glucose, Bld 103 (H) 70 - 99 mg/dL    Comment: Glucose reference range applies only to samples taken after fasting for at least 8 hours.   BUN 9 6 - 20 mg/dL   Creatinine, Ser 0.71 0.44 - 1.00 mg/dL   Calcium 8.6 (L) 8.9 - 10.3 mg/dL   GFR, Estimated >60 >60 mL/min    Comment: (NOTE) Calculated using the CKD-EPI Creatinine Equation (2021)    Anion gap 6 5 - 15    Comment: Performed at [facility] [address] [place], [place] [address]  CBC     Status: Abnormal   Collection Time: 08/21/20  5:58 AM  Result Value Ref Range   WBC 9.2 4.0 - 10.5 K/uL   RBC 4.02  3.87 - 5.11 MIL/uL   Hemoglobin 12.2 12.0 - 15.0 g/dL   HCT 35.7 (L) 36 - 46 %   MCV 88.8 80.0 - 100.0 fL   MCH 30.3 26.0 - 34.0 pg   MCHC 34.2 30.0 - 36.0 g/dL   RDW 13.9 11.5 - 15.5 %   Platelets 327 150 - 400 K/uL   nRBC 0.0 0.0 - 0.2 %    Comment: Performed at [facility], [place] [address] [place], [place] [address]  Heparin level (unfractionated)     Status: None   Collection Time: 08/21/20  5:58 AM  Result Value Ref Range   Heparin Unfractionated 0.47 0.30 - 0.70 IU/mL    Comment: (NOTE) If heparin results are below expected values, and patient dosage has  been confirmed, suggest follow up testing of antithrombin III levels. Performed at [facility], [place] [address] [place], [place] [address]   Protime-INR     Status: None   Collection Time: 08/21/20  5:58 AM  Result Value Ref Range   Prothrombin Time 13.8 11.4 - 15.2 seconds   INR 1.1 0.8 - 1.2    Comment: (NOTE) INR goal varies based on device and disease states. Performed at [facility], [place] [address] [place], [place] [address]   Lipid panel     Status: Abnormal   Collection Time: 08/21/20  5:58 AM  Result Value Ref Range   Cholesterol 135 0 - 200 mg/dL   Triglycerides 227 (H) <150 mg/dL   HDL 29 (L) >40 mg/dL   Total CHOL/HDL Ratio 4.7 RATIO   VLDL 45 (H) 0 - 40 mg/dL   LDL Cholesterol 61 0 - 99 mg/dL    Comment:        Total Cholesterol/HDL:CHD Risk Coronary Heart Disease Risk Table                     Men   Women  1/2 Average Risk   3.4   3.3  Average Risk       5.0   4.4  2 X Average Risk   9.6   7.1  3 X Average Risk  23.4   11.0        Use the calculated Patient Ratio above and the CHD Risk Table to determine the patient's CHD Risk.        ATP III CLASSIFICATION (LDL):  <100     mg/dL   Optimal  100-129  mg/dL   Near or Above                    Optimal  130-159  mg/dL   Borderline  160-189  mg/dL   High  >190     mg/dL   Very High Performed at [facility] [address], [place], [place] [address]   Magnesium     Status: None   Collection Time: 08/21/20  5:58 AM  Result Value Ref Range   Magnesium 1.7 1.7 - 2.4 mg/dL    Comment: Performed at [facility] [address] [place], [place] [address]  Glucose, capillary     Status: Abnormal   Collection Time: 08/21/20  8:36 AM  Result Value Ref Range   Glucose-Capillary 102 (H) 70 - 99 mg/dL    Comment: Glucose reference range applies only to samples taken after fasting for at least 8 hours.  Glucose, capillary     Status: None   Collection Time: 08/21/20 11:34 AM  Result Value Ref Range   Glucose-Capillary 76 70 - 99 mg/dL    Comment: Glucose reference range applies only to samples taken after fasting for at least 8 hours.   Comment 1 Notify RN   Heparin level (unfractionated)     Status: Abnormal   Collection Time: 08/21/20  3:55 PM  Result Value Ref Range   Heparin Unfractionated 0.22 (L) 0.30 - 0.70 IU/mL    Comment: (NOTE) If heparin results are below expected values, and patient dosage has  been confirmed, suggest follow up testing of antithrombin III levels. Performed at [facility], [place] [address], [place], [place] [address]   Glucose, capillary     Status: None   Collection Time: 08/21/20  7:42 PM  Result Value Ref Range   Glucose-Capillary 70 70 - 99 mg/dL    Comment: Glucose reference range applies only to samples taken after fasting for at least 8 hours.  Glucose, capillary     Status: None   Collection Time: 08/21/20  9:16 PM  Result Value Ref Range   Glucose-Capillary 79 70 - 99 mg/dL    Comment: Glucose reference range applies only to samples taken after fasting for at least 8 hours.  Glucose, capillary     Status: Abnormal   Collection Time: 08/22/20 12:13 AM  Result Value Ref Range   Glucose-Capillary 59 (L) 70 - 99 mg/dL    Comment: Glucose reference range applies only to samples taken after fasting for at least 8 hours.  Heparin level (unfractionated)      Status: None   Collection Time: 08/22/20 12:16 AM  Result Value Ref Range   Heparin Unfractionated 0.50 0.30 - 0.70 IU/mL    Comment: (NOTE) If heparin results are below expected values, and patient dosage has  been confirmed, suggest follow up testing of antithrombin III levels. Performed at [facility], [place] [address] [place] [address] [place], [place] [address]   Lipase, blood     Status: Abnormal   Collection Time: 08/22/20 12:16 AM  Result Value Ref Range   Lipase 57 (H) 11 - 51 U/L    Comment: Performed at [facility] [address] [place], [place] [address]  Basic metabolic panel     Status: Abnormal   Collection Time: 08/22/20 12:16 AM  Result Value Ref Range   Sodium 137 135 - 145 mmol/L   Potassium 3.3 (L) 3.5 - 5.1 mmol/L   Chloride 106 98 - 111 mmol/L   CO2 22 22 - 32 mmol/L   Glucose, Bld 68 (L) 70 - 99 mg/dL    Comment: Glucose reference range applies only to samples  taken after fasting for at least 8 hours.   BUN 6 6 - 20 mg/dL   Creatinine, Ser 0.66 0.44 - 1.00 mg/dL   Calcium 8.6 (L) 8.9 - 10.3 mg/dL   GFR, Estimated >60 >60 mL/min    Comment: (NOTE) Calculated using the CKD-EPI Creatinine Equation (2021)    Anion gap 9 5 - 15    Comment: Performed at [facility] [address] [place], [place] [address]  Heparin level (unfractionated)     Status: None   Collection Time: 08/22/20 12:16 AM  Result Value Ref Range   Heparin Unfractionated 0.43 0.30 - 0.70 IU/mL    Comment: (NOTE) If heparin results are below expected values, and patient dosage has  been confirmed, suggest follow up testing of antithrombin III levels. Performed at [facility], [place] [address] [place],  [address]   CBC     Status: Abnormal   Collection Time: 08/22/20 12:16 AM  Result Value Ref Range   WBC 6.3 4.0 - 10.5 K/uL   RBC 3.53 (L) 3.87 - 5.11 MIL/uL   Hemoglobin 10.7 (L) 12.0 - 15.0 g/dL   HCT 31.5 (L) 36 - 46 %   MCV 89.2 80.0 - 100.0 fL   MCH 30.3  26.0 - 34.0 pg   MCHC 34.0 30.0 - 36.0 g/dL   RDW 14.0 11.5 - 15.5 %   Platelets 263 150 - 400 K/uL   nRBC 0.0 0.0 - 0.2 %    Comment: Performed at [facility], [place] [address], [place],  [address]  Glucose, capillary     Status: Abnormal   Collection Time: 08/22/20 12:41 AM  Result Value Ref Range   Glucose-Capillary 126 (H) 70 - 99 mg/dL    Comment: Glucose reference range applies only to samples taken after fasting for at least 8 hours.  Glucose, capillary     Status: Abnormal   Collection Time: 08/22/20  4:00 AM  Result Value Ref Range   Glucose-Capillary 66 (L) 70 - 99 mg/dL    Comment: Glucose reference range applies only to samples taken after fasting for at least 8 hours.   Comment 1 Notify RN    Comment 2 Document in Chart   Glucose, capillary     Status: Abnormal   Collection Time: 08/22/20  4:39 AM  Result Value Ref Range   Glucose-Capillary 141 (H) 70 - 99 mg/dL    Comment: Glucose reference range applies only to samples taken after fasting for at least 8 hours.  Glucose, capillary     Status: Abnormal   Collection Time: 08/22/20  7:43 AM  Result Value Ref Range   Glucose-Capillary 123 (H) 70 - 99 mg/dL    Comment: Glucose reference range applies only to samples taken after fasting for at least 8 hours.    ECG   Normal sinus rhythm at 76, no ischemic changes - Personally Reviewed  Telemetry   Sinus rhythm - Personally Reviewed  Radiology    CT CARDIAC SCORING  Addendum Date: 08/21/2020   ADDENDUM REPORT: 08/21/2020 18:20 EXAM: OVER-READ INTERPRETATION  CT CHEST The following report is an over-read performed by radiologist Dr. Alvino Blood [facility] on 08/21/2020. This over-read does not include interpretation of cardiac or coronary anatomy or pathology. The calcium score interpretation by the cardiologist is attached. COMPARISON:  CT chest 08/20/2020 FINDINGS: Limited view of the lung parenchyma demonstrates no suspicious  nodularity. Airways are normal. Limited view of the mediastinum  demonstrates no adenopathy. Esophagus normal. Limited view of the upper abdomen unremarkable. Limited view of the skeleton and chest wall is unremarkable. IMPRESSION: No significant extracardiac findings. Electronically Signed   By: Suzy Bouchard M.D.   On: 08/21/2020 18:20   Result Date: 08/21/2020 CLINICAL DATA:  Risk stratification EXAM: Coronary Calcium Score TECHNIQUE: The patient was scanned on a [employer]. Axial non-contrast 3 mm slices were carried out through the heart. The data set was analyzed on a dedicated work station and scored using the [place]. FINDINGS: Non-cardiac: See separate report from [facility]. Ascending Aorta: Normal caliber.  Atherosclerosis. Pericardium: Normal Coronary arteries: Normal origins.  Multivessel CAC. IMPRESSION: Coronary calcium score of 35. This was 94th percentile for age and sex matched control. Electronically Signed: By: Pixie Casino M.D. On: 08/21/2020 16:27   DG Chest Portable 1 View  Result Date: 08/20/2020 CLINICAL DATA:  Chest pain EXAM: PORTABLE CHEST 1 VIEW COMPARISON:  Same-day CT FINDINGS: The heart size and mediastinal contours are within normal limits. No focal airspace consolidation, pleural effusion, or pneumothorax. The visualized skeletal structures are unremarkable. IMPRESSION: No active disease. Electronically Signed   By: Davina Poke D.O.   On: 08/20/2020 14:55   ECHOCARDIOGRAM COMPLETE  Result Date: 08/21/2020    ECHOCARDIOGRAM REPORT   Patient Name:   Ariana Herrera Date of Exam: 08/21/2020 Medical Rec #:  [id number]     Height:       62.0 in Accession #:    [id number]    Weight:       160.2 lb Date of Birth:  05/22/1966    BSA:          1.740 m Patient Age:    62 years      BP:           125/70 mmHg Patient Gender: F             HR:           90 bpm. Exam Location:  Inpatient Procedure: 2D Echo, Color Doppler and Cardiac Doppler  Indications:    NSTEMI  History:        Patient has no prior history of Echocardiogram examinations.                 Risk Factors:Diabetes.  Sonographer:    Raquel Sarna Senior RDCS Referring Phys: Isaiah Serge IMPRESSIONS  1. Left ventricular ejection fraction, by estimation, is 60 to 65%. The left ventricle has normal function. The left ventricle has no regional wall motion abnormalities. Left ventricular diastolic parameters were normal.  2. Right ventricular systolic function is normal. The right ventricular size is normal. Tricuspid regurgitation signal is inadequate for assessing PA pressure.  3. The mitral valve is normal in structure. Trivial mitral valve regurgitation. No evidence of mitral stenosis.  4. The aortic valve is tricuspid. Aortic valve regurgitation is not visualized. No aortic stenosis is present.  5. The inferior vena cava is normal in size with greater than 50% respiratory variability, suggesting right atrial pressure of 3 mmHg. FINDINGS  Left Ventricle: Left ventricular ejection fraction, by estimation, is 60 to 65%. The left ventricle has normal function. The left ventricle has no regional wall motion abnormalities. The left ventricular internal cavity size was normal in size. There is  no left ventricular hypertrophy. Left ventricular diastolic parameters were normal. Right Ventricle: The right ventricular size is normal. No increase in right ventricular wall thickness. Right ventricular systolic function is normal. Tricuspid regurgitation  signal is inadequate for assessing PA pressure. Left Atrium: Left atrial size was normal in size. Right Atrium: Right atrial size was normal in size. Pericardium: There is no evidence of pericardial effusion. Mitral Valve: The mitral valve is normal in structure. Trivial mitral valve regurgitation. No evidence of mitral valve stenosis. Tricuspid Valve: The tricuspid valve is normal in structure. Tricuspid valve regurgitation is not demonstrated. Aortic Valve:  The aortic valve is tricuspid. Aortic valve regurgitation is not visualized. No aortic stenosis is present. Pulmonic Valve: The pulmonic valve was normal in structure. Pulmonic valve regurgitation is not visualized. Aorta: The aortic root is normal in size and structure. Venous: The inferior vena cava is normal in size with greater than 50% respiratory variability, suggesting right atrial pressure of 3 mmHg. IAS/Shunts: No atrial level shunt detected by color flow Doppler.  LEFT VENTRICLE PLAX 2D LVIDd:         4.00 cm  Diastology LVIDs:         2.10 cm  LV e' medial:    8.16 cm/s LV PW:         0.60 cm  LV E/e' medial:  9.9 LV IVS:        0.90 cm  LV e' lateral:   9.03 cm/s LVOT diam:     2.00 cm  LV E/e' lateral: 9.0 LV SV:         62 LV SV Index:   36 LVOT Area:     3.14 cm  RIGHT VENTRICLE RV S prime:     9.57 cm/s TAPSE (M-mode): 1.7 cm LEFT ATRIUM             Index       RIGHT ATRIUM           Index LA diam:        2.50 cm 1.44 cm/m  RA Area:     11.90 cm LA Vol (A2C):   38.1 ml 21.90 ml/m RA Volume:   26.80 ml  15.41 ml/m LA Vol (A4C):   28.3 ml 16.27 ml/m LA Biplane Vol: 32.8 ml 18.85 ml/m  AORTIC VALVE LVOT Vmax:   102.00 cm/s LVOT Vmean:  75.900 cm/s LVOT VTI:    0.197 m  AORTA Ao Root diam: 3.00 cm Ao Asc diam:  3.20 cm MITRAL VALVE MV Area (PHT): 3.21 cm    SHUNTS MV Decel Time: 236 msec    Systemic VTI:  0.20 m MV E velocity: 81.00 cm/s  Systemic Diam: 2.00 cm MV A velocity: 68.60 cm/s MV E/A ratio:  1.18 Loralie Champagne MD Electronically signed by Loralie Champagne MD Signature Date/Time: 08/21/2020/2:50:07 PM    Final    CT Angio Chest/Abd/Pel for Dissection W and/or Wo Contrast  Result Date: 08/20/2020 CLINICAL DATA:  Severe epigastric pain, abdominal pain. History of left breast cancer treated with lumpectomy, chemotherapy, and radiation in September of 2019 EXAM: CT ANGIOGRAPHY CHEST, ABDOMEN AND PELVIS TECHNIQUE: Non-contrast CT of the chest was initially obtained. Multidetector CT imaging  through the chest, abdomen and pelvis was performed using the standard protocol during bolus administration of intravenous contrast. Multiplanar reconstructed images and MIPs were obtained and reviewed to evaluate the vascular anatomy. CONTRAST:  141mL OMNIPAQUE IOHEXOL 350 MG/ML SOLN COMPARISON:  None. FINDINGS: CTA CHEST FINDINGS Cardiovascular: Initial noncontrast imaging of the chest demonstrates no evidence for aortic intramural hematoma. Arterial phase postcontrast imaging demonstrates no evidence of aortic aneurysm or dissection. Four vessel arch with the left vertebral artery arising directly from  the arch, an anatomic variant. There is scattered atherosclerotic calcification of the aorta. There is an 11 x 5 mm area of noncalcified atherosclerotic plaque with a slightly polypoid configuration projecting into the lumen of the distal descending thoracic aorta (series 12, image 93). Central pulmonary vasculature is within normal limits. No central filling defect. Normal heart size. No pericardial effusion. Mediastinum/Nodes: No axillary, mediastinal, or hilar lymphadenopathy. Thyroid and trachea within normal limits. Small sliding-type hiatal hernia. Esophagus otherwise within normal limits. Lungs/Pleura: No focal airspace consolidation. No suspicious pulmonary nodules or masses. No pleural effusion or pneumothorax. Musculoskeletal: There is a somewhat mottled appearance of the left third rib anteriorly (series 7, image 34) with some scarring in the adjacent lung, potentially representing post radiation changes. Osseous structures of the bony thorax appear otherwise within normal limits without suspicious bony lesion. Skin thickening of the left breast with irregularly marginated density within the inner aspect of the breast abutting the pectoralis musculature measuring approximately 2.3 x 1.8 x 2.2 cm (series 7, image 44; series 12, image 116), which may represent patient's lumpectomy site. Review of the MIP  images confirms the above findings. CTA ABDOMEN AND PELVIS FINDINGS VASCULAR Aorta: Normal caliber aorta without aneurysm, dissection, vasculitis or significant stenosis. Mild calcified and noncalcified atherosclerotic plaque. Celiac: Patent without evidence of aneurysm, dissection, vasculitis or significant stenosis. SMA: Patent without evidence of aneurysm, dissection, vasculitis or significant stenosis. Renals: Both renal arteries are patent without evidence of aneurysm, dissection, vasculitis, fibromuscular dysplasia or significant stenosis. IMA: Patent. Inflow: Patent without evidence of aneurysm, dissection, vasculitis or significant stenosis. Mild calcified and noncalcified atherosclerotic plaque. Veins: No obvious venous abnormality within the limitations of this arterial phase study. Review of the MIP images confirms the above findings. NON-VASCULAR Hepatobiliary: Diffusely decreased attenuation of the hepatic parenchyma. No focal hepatic lesion is identified on arterial phase imaging. Status post cholecystectomy. No biliary dilatation. Pancreas: There is peripancreatic fat stranding around the pancreatic head and proximal body (series 7, image 93). No pancreatic ductal dilatation. No evidence of parenchymal non enhancement. No peripancreatic fluid collection. Spleen: 1.8 x 0.9 cm rounded area of hypoattenuation within the anterior aspect of the spleen (series 7, image 84; series 12, image 160) measuring greater than fluid density. Spleen is otherwise within normal limits. There are 2 small splenules in the region of the splenic hilum. Adrenals/Urinary Tract: No adrenal nodules or masses. There are wedge-shaped areas of decreased enhancement within the inferior pole of the right kidney (series 10, images 117 and 121). Possible tiny area of hypoattenuating cortex in the inferior pole of the left kidney (series 10, image 119). Kidneys have otherwise symmetric enhancement. No enhancing renal mass, stone, or  hydronephrosis. Urinary bladder unremarkable. Stomach/Bowel: Small hiatal hernia. Stomach is otherwise within normal limits. Appendix appears normal (series 7, image 159). No evidence of bowel wall thickening, distention, or inflammatory changes. Moderate volume stool within the colon. Lymphatic: No abdominopelvic lymphadenopathy. Reproductive: Uterus and bilateral adnexa are unremarkable. Other: No free fluid. No abdominopelvic fluid collection. No pneumoperitoneum. No abdominal wall hernia. Musculoskeletal: No acute or significant osseous findings. Review of the MIP images confirms the above findings. IMPRESSION: Vascular findings: 1. Negative for aortic aneurysm or dissection. 2. Small wedge-shaped areas of decreased enhancement within the inferior poles of the right kidney and left kidney, concerning for areas of focal renal infarction. Pyelonephritis could have a similar appearance. Correlation with urinalysis is recommended. 3. Focal 11 x 5 mm area of noncalcified atherosclerotic plaque with a slightly polypoid configuration  projecting into the lumen of the distal descending thoracic aorta. This may be at risk for potential plaque thrombosis. Nonvascular findings: 1. Findings of acute uncomplicated pancreatitis. No evidence of pancreatic necrosis or peripancreatic fluid collection. Correlate with serum lipase. 2. Findings suggestive of hepatic steatosis. 3. Indeterminate 1.8 cm rounded area of hypoattenuation within the anterior aspect of the spleen. Further evaluation with contrast-enhanced MRI can be performed on a nonemergent basis. 4. Slightly mottled appearance of the left third rib anteriorly with some adjacent scarring in the adjacent lung, potentially representing post radiation changes. 5. Left breast skin thickening with suspected lumpectomy site at the inner left breast. Continued mammographic follow-up is recommended. Electronically Signed   By: Davina Poke D.O.   On: 08/20/2020 11:40   US  Abdomen Limited RUQ (LIVER/GB)  Result Date: 08/20/2020 CLINICAL DATA:  Pancreatitis.  Right upper quadrant pain.  Nausea. EXAM: ULTRASOUND ABDOMEN LIMITED RIGHT UPPER QUADRANT COMPARISON:  Dissection protocol CT earlier today. FINDINGS: Gallbladder: Surgically absent. Common bile duct: Diameter: 4 mm, nondilated. Liver: Heterogeneously increased in parenchymal echogenicity. Liver parenchyma is difficult to penetrate. No focal abnormalities seen by ultrasound. Portal vein is patent on color Doppler imaging with normal direction of blood flow towards the liver. Other: No right upper quadrant ascites. IMPRESSION: 1. Post cholecystectomy without biliary dilatation. 2. Heterogeneous hepatic parenchyma which is difficult to penetrate, typical of steatosis. Electronically Signed   By: Keith Rake M.D.   On: 08/20/2020 19:46    Cardiac Studies   Echo as above, calcium score  Assessment   Principal Problem:   NSTEMI (non-ST elevated myocardial infarction) ([place]) Active Problems:   Breast cancer (HCC)   Pancreatitis, acute   Bipolar disease, chronic (HCC)   Diabetes mellitus type 2 in obese (HCC)   Class 1 obesity due to excess calories with body mass index (BMI) of 31.0 to 31.9 in adult   Plan   1. Normal echo but significantly elevated troponin -still suspect troponin elevation due to pancreatitis, likely worsened by diabetes medication. However, there is disproportionate coronary calcium, she is a smoker and had known non-obstructive CAD with similar presentation in the past for NSTEMI, but was not stented. Will proceed with coronary angiogram today.  Time Spent Directly with Patient:  I have spent a total of 25 minutes with the patient reviewing hospital notes, telemetry, EKGs, labs and examining the patient as well as establishing an assessment and plan that was discussed personally with the patient.  > 50% of time was spent in direct patient care.  Length of Stay:  LOS: 2 days    Pixie Casino, MD, [facility], [place] Director of the [facility]  [facility] Diplomate of the American Board of Clinical Lipidology Attending Cardiologist  Direct Dial: [phone]  Fax: [phone]  Website:  [email or web address] Lallie Strahm 08/22/2020, 9:00 AM

## 2020-08-22 NOTE — Progress Notes (Signed)
[place] for heparin Indication: chest pain/ACS  Allergies  Allergen Reactions  . Lithium Other (See Comments)    Spinal fluid built up in brain  . Penicillins Other (See Comments)    UNKNOWN CHILDHOOD REACTION    Patient Measurements: Height: 5\' 2"  (157.5 cm) Weight: 72.7 kg (160 lb 3.2 oz) IBW/kg (Calculated) : 50.1 Heparin Dosing Weight: 67.3 kg  Vital Signs: Temp: 98.2 F (36.8 C) (10/29 0011) Temp Source: Oral (10/29 0011) BP: 99/60 (10/29 0011) Pulse Rate: 90 (10/29 0011)  Labs: Recent Labs    08/20/20 1027 08/20/20 1027 08/20/20 1040 08/20/20 1332 08/20/20 1503 08/20/20 2149 08/21/20 0558 08/21/20 1555 08/22/20 0016  HGB 13.2   < > 13.6  --   --   --  12.2  --   --   HCT 38.3  --  40.0  --   --   --  35.7*  --   --   PLT 408*  --   --   --   --   --  327  --   --   LABPROT 13.2  --   --   --   --   --  13.8  --   --   INR 1.0  --   --   --   --   --  1.1  --   --   HEPARINUNFRC  --   --   --   --   --    < > 0.47 0.22* 0.50  0.43  CREATININE  --   --  0.60  --   --   --  0.71  --   --   TROPONINIHS 1,534*  --   --  1,529* 1,415*  --   --   --   --    < > = values in this interval not displayed.    Estimated Creatinine Clearance: 77.6 mL/min (by C-G formula based on SCr of 0.71 mg/dL).    Assessment: 54 yo W with chest pain and elevated troponins, started on heparin infusion. No AC PTA.   Heparin level therapeutic (0.43 and 0.5) on gtt at 1250 units/hr.  Goal of Therapy:  Heparin level 0.3-0.7 units/ml Monitor platelets by anticoagulation protocol: Yes   Plan: Continue heparin at 1250 units/hr F/u daily heparin level  Sherlon Handing, PharmD, BCPS Please see amion for complete clinical pharmacist phone list 08/22/2020 12:56 AM

## 2020-08-22 NOTE — Progress Notes (Addendum)
PROGRESS NOTE    Ariana Herrera  PJK:[id number] DOB: 01-18-1966 DOA: 08/20/2020 PCP: Dulce Sellar, MD   Brief Narrative:  HPI on 08/20/2020 by Dr. Karmen Bongo Gabrielly Mccrystal is a 54 y.o. female with medical history significant of CAD and breast cancer treated with chemo/rads and with residual lymphedema presenting with abdominal pain.  She reports she was awakened overnight with severe abdominal pain.  It got worse and worse for about 2 hours and her boyfriend called 911.  +n/v.  She felt ok yesterday with mild cramping.  She is still having severe substernal pain that radiates into her back.  Nothing makes it worse, better with pain meds but it is short-lived.  No h/o similar prior.  She is very nauseated.  She did have an MI in the past (maybe 4 years ago) - presented with chest discomfort, told it might be GERD and they discharged her.  About 1-2 days later she went back and they said a stent was not needed but it was a heart attack.    Of note, she was seen at Urgent Care 2 days ago with R sided shoulder pain and chest pain.  She took NTG x 2 for this due to concern that it was a heart attack.  She reports being told that it was a shoulder strain for which she was given prednisone.  She took the 60 mg prednisone yesterday for the first time and symptoms developed overnight.  Her only other pain medication was Norco, which she takes regularly for pain.  Denies NSAIDs use.  Interim history Patient admitted with NSTEMI and also possible pancreatitis.  Cardiology consulted.  Cardiac catheterization.  Currently n.p.o., conservative management for possible pancreatitis. Assessment & Plan   NSTEMI -Patient presented with substernal chest pain along with right shoulder pain that she found was similar to her prior MI -She presented to urgent care prior to admission and was given prednisone for possible shoulder strain -High-sensitivity troponin IV, and trending downward to 1415 -Cardiology  consulted and appreciated -Initially recommended placing the patient on heparin, obtaining echocardiogram and possible need for cardiac catheterization -Echocardiogram pending -Today, cardiology recommending cardiac CT -Risk ratification including hemoglobin A1c was 10.8, lipid panel showed total cholesterol 170, HDL 38, LDL 101, triglycerides 153 -CT coronary was not completed due to heart rate control without hypotension.  Calcium score 35.  Cardiology planning to move forward with cardiac cath.  Acute pancreatitis/epigastric pain -Upon admission, lipase 148.  Base down to 57 -RUQ ultrasound: Post cholecystectomy without biliary dilatation.  Heterogeneous hepatic parenchyma which is difficult to penetrate, typical steatosis -Patient denies any alcohol use.  She has been on Trulicity which has been held (pancreatitis listed as an adverse reaction).  Her triglycerides minimally elevated at 153 -At this point if this is truly pancreatitis, will continue n.p.o. along with IV fluids and pain control along with antiemetics -may start patient on clear liquids after heart cath  Hyperlipidemia -Lipid panel as above -Continue statin -TriCor held  Diabetes mellitus, type II -Hemoglobin A1c as above, 10.8 -Continue Lantus, insulin sliding scale and CBG monitoring  Bipolar disorder -Continue Seroquel  Chronic pain -Continue Tylenol, Dilaudid as needed  Breast cancer -Continue to follow-up with Dr. Marin Olp, oncology  Obesity -BMI of 29.3 -Patient to follow-up with PCP to discuss lifestyle modifications  Splenic lesion -Incidentally seen on CTA -Patient may need outpatient MRI for follow-up  Hypokalemia/hypomagnesemia  -Replacing continue to monitor BMP and magnesium  DVT Prophylaxis Heparin  Code Status: Full  Family Communication: None at bedside  Disposition Plan:  Status is: Inpatient  Remains inpatient appropriate because:Ongoing active pain requiring inpatient pain  management and IV treatments appropriate due to intensity of illness or inability to take PO. Pending cardiac cath today.    Dispo: The patient is from: Home              Anticipated d/c is to: Home              Anticipated d/c date is: 2 days              Patient currently is not medically stable to d/c.   Consultants Cardiology  Procedures  RUQ ultrasound Echocardiogram  Antibiotics   Anti-infectives (From admission, onward)   None      Subjective:   Lubertha Basque seen and examined today.  Continues to have some epigastric tenderness and soreness but states it is improved since coming to the hospital.  Denies current chest pain or shortness of breath, nausea or vomiting, dizziness or headache.    Objective:   Vitals:   08/22/20 0011 08/22/20 0228 08/22/20 0357 08/22/20 0742  BP: 99/60  110/65 111/63  Pulse: 90  93 91  Resp: 20  18 16   Temp: 98.2 F (36.8 C)  99 F (37.2 C) 99.3 F (37.4 C)  TempSrc: Oral  Oral Oral  SpO2: 93%  97% 93%  Weight:  74.3 kg    Height:        Intake/Output Summary (Last 24 hours) at 08/22/2020 0941 Last data filed at 08/22/2020 0928 Gross per 24 hour  Intake 1461.11 ml  Output 2350 ml  Net -888.89 ml   Filed Weights   08/20/20 1500 08/21/20 0427 08/22/20 0228  Weight: 78.2 kg 72.7 kg 74.3 kg   Exam  General: Well developed, well nourished, NAD, appears stated age  52: NCAT,mucous membranes moist.   Cardiovascular: S1 S2 auscultated, no rubs, murmurs or gallops. Regular rate and rhythm.  Respiratory: Clear to auscultation bilaterally with equal chest rise  Abdomen: Soft, epigastric TTP, nondistended, + bowel sounds  Extremities: warm dry without cyanosis clubbing or edema  Neuro: AAOx3, nonfocal  Psych: appropriate mood and affect, pleasant   Data Reviewed: I have personally reviewed following labs and imaging studies  CBC: Recent Labs  Lab 08/20/20 1027 08/20/20 1040 08/21/20 0558 08/22/20 0016  WBC  12.8*  --  9.2 6.3  HGB 13.2 13.6 12.2 10.7*  HCT 38.3 40.0 35.7* 31.5*  MCV 89.3  --  88.8 89.2  PLT 408*  --  327 144   Basic Metabolic Panel: Recent Labs  Lab 08/20/20 1040 08/20/20 1503 08/20/20 1547 08/21/20 0558 08/22/20 0016  NA 135  --   --  136 137  K 3.9  --   --  3.6 3.3*  CL 101  --   --  106 106  CO2  --   --   --  24 22  GLUCOSE 334*  --   --  103* 68*  BUN 14  --   --  9 6  CREATININE 0.60  --   --  0.71 0.66  CALCIUM  --   --   --  8.6* 8.6*  MG  --  1.5* 1.3* 1.7 1.5*   GFR: Estimated Creatinine Clearance: 78.5 mL/min (by C-G formula based on SCr of 0.66 mg/dL). Liver Function Tests: Recent Labs  Lab 08/20/20 1223  AST 23  ALT 23  ALKPHOS 72  BILITOT 0.9  PROT 6.1*  ALBUMIN 3.4*   Recent Labs  Lab 08/20/20 1223 08/22/20 0016  LIPASE 148* 57*   No results for input(s): AMMONIA in the last 168 hours. Coagulation Profile: Recent Labs  Lab 08/20/20 1027 08/21/20 0558  INR 1.0 1.1   Cardiac Enzymes: No results for input(s): CKTOTAL, CKMB, CKMBINDEX, TROPONINI in the last 168 hours. BNP (last 3 results) No results for input(s): PROBNP in the last 8760 hours. HbA1C: Recent Labs    08/20/20 1421  HGBA1C 10.8*   CBG: Recent Labs  Lab 08/22/20 0013 08/22/20 0041 08/22/20 0400 08/22/20 0439 08/22/20 0743  GLUCAP 59* 126* 66* 141* 123*   Lipid Profile: Recent Labs    08/20/20 1547 08/21/20 0558  CHOL 170 135  HDL 38* 29*  LDLCALC 101* 61  TRIG 153* 227*  CHOLHDL 4.5 4.7   Thyroid Function Tests: Recent Labs    08/20/20 1547  TSH 1.011  FREET4 0.75   Anemia Panel: No results for input(s): VITAMINB12, FOLATE, FERRITIN, TIBC, IRON, RETICCTPCT in the last 72 hours. Urine analysis: No results found for: COLORURINE, APPEARANCEUR, LABSPEC, PHURINE, GLUCOSEU, HGBUR, BILIRUBINUR, KETONESUR, PROTEINUR, UROBILINOGEN, NITRITE, LEUKOCYTESUR Sepsis Labs: @LABRCNTIP (procalcitonin:4,lacticidven:4)  ) Recent Results (from the past  240 hour(s))  Respiratory Panel by RT PCR (Flu A&B, Covid) - Nasopharyngeal Swab     Status: None   Collection Time: 08/20/20 11:47 AM   Specimen: Nasopharyngeal Swab  Result Value Ref Range Status   SARS Coronavirus 2 by RT PCR NEGATIVE NEGATIVE Final    Comment: (NOTE) SARS-CoV-2 target nucleic acids are NOT DETECTED.  The SARS-CoV-2 RNA is generally detectable in upper respiratoy specimens during the acute phase of infection. The lowest concentration of SARS-CoV-2 viral copies this assay can detect is 131 copies/mL. A negative result does not preclude SARS-Cov-2 infection and should not be used as the sole basis for treatment or other patient management decisions. A negative result may occur with  improper specimen collection/handling, submission of specimen other than nasopharyngeal swab, presence of viral mutation(s) within the areas targeted by this assay, and inadequate number of viral copies (<131 copies/mL). A negative result must be combined with clinical observations, patient history, and epidemiological information. The expected result is Negative.  Fact Sheet for Patients:  PinkCheek.be  Fact Sheet for Healthcare Providers:  GravelBags.it  This test is no t yet approved or cleared by the [place] FDA and  has been authorized for detection and/or diagnosis of SARS-CoV-2 by FDA under an Emergency Use Authorization (EUA). This EUA will remain  in effect (meaning this test can be used) for the duration of the COVID-19 declaration under Section 564(b)(1) of the Act, 21 U.S.C. section 360bbb-3(b)(1), unless the authorization is terminated or revoked sooner.     Influenza A by PCR NEGATIVE NEGATIVE Final   Influenza B by PCR NEGATIVE NEGATIVE Final    Comment: (NOTE) The Xpert Xpress SARS-CoV-2/FLU/RSV assay is intended as an aid in  the diagnosis of influenza from Nasopharyngeal swab specimens and  should  not be used as a sole basis for treatment. Nasal washings and  aspirates are unacceptable for Xpert Xpress SARS-CoV-2/FLU/RSV  testing.  Fact Sheet for Patients: PinkCheek.be  Fact Sheet for Healthcare Providers: GravelBags.it  This test is not yet approved or cleared by the [place] FDA and  has been authorized for detection and/or diagnosis of SARS-CoV-2 by  FDA under an Emergency Use Authorization (EUA). This EUA will remain  in effect (meaning this test can be used) for  the duration of the  Covid-19 declaration under Section 564(b)(1) of the Act, 21  U.S.C. section 360bbb-3(b)(1), unless the authorization is  terminated or revoked. Performed at [facility], [place] [address] [place],  [address]       Radiology Studies: CT CARDIAC SCORING  Addendum Date: 08/21/2020   ADDENDUM REPORT: 08/21/2020 18:20 EXAM: OVER-READ INTERPRETATION  CT CHEST The following report is an over-read performed by radiologist Dr. Alvino Blood [facility] on 08/21/2020. This over-read does not include interpretation of cardiac or coronary anatomy or pathology. The calcium score interpretation by the cardiologist is attached. COMPARISON:  CT chest 08/20/2020 FINDINGS: Limited view of the lung parenchyma demonstrates no suspicious nodularity. Airways are normal. Limited view of the mediastinum demonstrates no adenopathy. Esophagus normal. Limited view of the upper abdomen unremarkable. Limited view of the skeleton and chest wall is unremarkable. IMPRESSION: No significant extracardiac findings. Electronically Signed   By: Suzy Bouchard M.D.   On: 08/21/2020 18:20   Result Date: 08/21/2020 CLINICAL DATA:  Risk stratification EXAM: Coronary Calcium Score TECHNIQUE: The patient was scanned on a [employer]. Axial non-contrast 3 mm slices were carried out through the heart. The data set was analyzed on a  dedicated work station and scored using the [employer]. FINDINGS: Non-cardiac: See separate report from [facility]. Ascending Aorta: Normal caliber.  Atherosclerosis. Pericardium: Normal Coronary arteries: Normal origins.  Multivessel CAC. IMPRESSION: Coronary calcium score of 35. This was 94th percentile for age and sex matched control. Electronically Signed: By: Pixie Casino M.D. On: 08/21/2020 16:27   DG Chest Portable 1 View  Result Date: 08/20/2020 CLINICAL DATA:  Chest pain EXAM: PORTABLE CHEST 1 VIEW COMPARISON:  Same-day CT FINDINGS: The heart size and mediastinal contours are within normal limits. No focal airspace consolidation, pleural effusion, or pneumothorax. The visualized skeletal structures are unremarkable. IMPRESSION: No active disease. Electronically Signed   By: Davina Poke D.O.   On: 08/20/2020 14:55   ECHOCARDIOGRAM COMPLETE  Result Date: 08/21/2020    ECHOCARDIOGRAM REPORT   Patient Name:   Vedika Dumlao Date of Exam: 08/21/2020 Medical Rec #:  [id number]     Height:       62.0 in Accession #:    [id number]    Weight:       160.2 lb Date of Birth:  August 31, 1966    BSA:          1.740 m Patient Age:    56 years      BP:           125/70 mmHg Patient Gender: F             HR:           90 bpm. Exam Location:  Inpatient Procedure: 2D Echo, Color Doppler and Cardiac Doppler Indications:    NSTEMI  History:        Patient has no prior history of Echocardiogram examinations.                 Risk Factors:Diabetes.  Sonographer:    Raquel Sarna Senior RDCS Referring Phys: Isaiah Serge IMPRESSIONS  1. Left ventricular ejection fraction, by estimation, is 60 to 65%. The left ventricle has normal function. The left ventricle has no regional wall motion abnormalities. Left ventricular diastolic parameters were normal.  2. Right ventricular systolic function is normal. The right ventricular size is normal. Tricuspid regurgitation signal is inadequate for assessing PA pressure.  3.  The mitral  valve is normal in structure. Trivial mitral valve regurgitation. No evidence of mitral stenosis.  4. The aortic valve is tricuspid. Aortic valve regurgitation is not visualized. No aortic stenosis is present.  5. The inferior vena cava is normal in size with greater than 50% respiratory variability, suggesting right atrial pressure of 3 mmHg. FINDINGS  Left Ventricle: Left ventricular ejection fraction, by estimation, is 60 to 65%. The left ventricle has normal function. The left ventricle has no regional wall motion abnormalities. The left ventricular internal cavity size was normal in size. There is  no left ventricular hypertrophy. Left ventricular diastolic parameters were normal. Right Ventricle: The right ventricular size is normal. No increase in right ventricular wall thickness. Right ventricular systolic function is normal. Tricuspid regurgitation signal is inadequate for assessing PA pressure. Left Atrium: Left atrial size was normal in size. Right Atrium: Right atrial size was normal in size. Pericardium: There is no evidence of pericardial effusion. Mitral Valve: The mitral valve is normal in structure. Trivial mitral valve regurgitation. No evidence of mitral valve stenosis. Tricuspid Valve: The tricuspid valve is normal in structure. Tricuspid valve regurgitation is not demonstrated. Aortic Valve: The aortic valve is tricuspid. Aortic valve regurgitation is not visualized. No aortic stenosis is present. Pulmonic Valve: The pulmonic valve was normal in structure. Pulmonic valve regurgitation is not visualized. Aorta: The aortic root is normal in size and structure. Venous: The inferior vena cava is normal in size with greater than 50% respiratory variability, suggesting right atrial pressure of 3 mmHg. IAS/Shunts: No atrial level shunt detected by color flow Doppler.  LEFT VENTRICLE PLAX 2D LVIDd:         4.00 cm  Diastology LVIDs:         2.10 cm  LV e' medial:    8.16 cm/s LV PW:          0.60 cm  LV E/e' medial:  9.9 LV IVS:        0.90 cm  LV e' lateral:   9.03 cm/s LVOT diam:     2.00 cm  LV E/e' lateral: 9.0 LV SV:         62 LV SV Index:   36 LVOT Area:     3.14 cm  RIGHT VENTRICLE RV S prime:     9.57 cm/s TAPSE (M-mode): 1.7 cm LEFT ATRIUM             Index       RIGHT ATRIUM           Index LA diam:        2.50 cm 1.44 cm/m  RA Area:     11.90 cm LA Vol (A2C):   38.1 ml 21.90 ml/m RA Volume:   26.80 ml  15.41 ml/m LA Vol (A4C):   28.3 ml 16.27 ml/m LA Biplane Vol: 32.8 ml 18.85 ml/m  AORTIC VALVE LVOT Vmax:   102.00 cm/s LVOT Vmean:  75.900 cm/s LVOT VTI:    0.197 m  AORTA Ao Root diam: 3.00 cm Ao Asc diam:  3.20 cm MITRAL VALVE MV Area (PHT): 3.21 cm    SHUNTS MV Decel Time: 236 msec    Systemic VTI:  0.20 m MV E velocity: 81.00 cm/s  Systemic Diam: 2.00 cm MV A velocity: 68.60 cm/s MV E/A ratio:  1.18 Loralie Champagne MD Electronically signed by Loralie Champagne MD Signature Date/Time: 08/21/2020/2:50:07 PM    Final    CT Angio Chest/Abd/Pel for Dissection W and/or Wo Contrast  Result Date: 08/20/2020 CLINICAL DATA:  Severe epigastric pain, abdominal pain. History of left breast cancer treated with lumpectomy, chemotherapy, and radiation in September of 2019 EXAM: CT ANGIOGRAPHY CHEST, ABDOMEN AND PELVIS TECHNIQUE: Non-contrast CT of the chest was initially obtained. Multidetector CT imaging through the chest, abdomen and pelvis was performed using the standard protocol during bolus administration of intravenous contrast. Multiplanar reconstructed images and MIPs were obtained and reviewed to evaluate the vascular anatomy. CONTRAST:  126mL OMNIPAQUE IOHEXOL 350 MG/ML SOLN COMPARISON:  None. FINDINGS: CTA CHEST FINDINGS Cardiovascular: Initial noncontrast imaging of the chest demonstrates no evidence for aortic intramural hematoma. Arterial phase postcontrast imaging demonstrates no evidence of aortic aneurysm or dissection. Four vessel arch with the left vertebral artery arising  directly from the arch, an anatomic variant. There is scattered atherosclerotic calcification of the aorta. There is an 11 x 5 mm area of noncalcified atherosclerotic plaque with a slightly polypoid configuration projecting into the lumen of the distal descending thoracic aorta (series 12, image 93). Central pulmonary vasculature is within normal limits. No central filling defect. Normal heart size. No pericardial effusion. Mediastinum/Nodes: No axillary, mediastinal, or hilar lymphadenopathy. Thyroid and trachea within normal limits. Small sliding-type hiatal hernia. Esophagus otherwise within normal limits. Lungs/Pleura: No focal airspace consolidation. No suspicious pulmonary nodules or masses. No pleural effusion or pneumothorax. Musculoskeletal: There is a somewhat mottled appearance of the left third rib anteriorly (series 7, image 34) with some scarring in the adjacent lung, potentially representing post radiation changes. Osseous structures of the bony thorax appear otherwise within normal limits without suspicious bony lesion. Skin thickening of the left breast with irregularly marginated density within the inner aspect of the breast abutting the pectoralis musculature measuring approximately 2.3 x 1.8 x 2.2 cm (series 7, image 44; series 12, image 116), which may represent patient's lumpectomy site. Review of the MIP images confirms the above findings. CTA ABDOMEN AND PELVIS FINDINGS VASCULAR Aorta: Normal caliber aorta without aneurysm, dissection, vasculitis or significant stenosis. Mild calcified and noncalcified atherosclerotic plaque. Celiac: Patent without evidence of aneurysm, dissection, vasculitis or significant stenosis. SMA: Patent without evidence of aneurysm, dissection, vasculitis or significant stenosis. Renals: Both renal arteries are patent without evidence of aneurysm, dissection, vasculitis, fibromuscular dysplasia or significant stenosis. IMA: Patent. Inflow: Patent without evidence of  aneurysm, dissection, vasculitis or significant stenosis. Mild calcified and noncalcified atherosclerotic plaque. Veins: No obvious venous abnormality within the limitations of this arterial phase study. Review of the MIP images confirms the above findings. NON-VASCULAR Hepatobiliary: Diffusely decreased attenuation of the hepatic parenchyma. No focal hepatic lesion is identified on arterial phase imaging. Status post cholecystectomy. No biliary dilatation. Pancreas: There is peripancreatic fat stranding around the pancreatic head and proximal body (series 7, image 93). No pancreatic ductal dilatation. No evidence of parenchymal non enhancement. No peripancreatic fluid collection. Spleen: 1.8 x 0.9 cm rounded area of hypoattenuation within the anterior aspect of the spleen (series 7, image 84; series 12, image 160) measuring greater than fluid density. Spleen is otherwise within normal limits. There are 2 small splenules in the region of the splenic hilum. Adrenals/Urinary Tract: No adrenal nodules or masses. There are wedge-shaped areas of decreased enhancement within the inferior pole of the right kidney (series 10, images 117 and 121). Possible tiny area of hypoattenuating cortex in the inferior pole of the left kidney (series 10, image 119). Kidneys have otherwise symmetric enhancement. No enhancing renal mass, stone, or hydronephrosis. Urinary bladder unremarkable. Stomach/Bowel: Small hiatal hernia. Stomach is otherwise  within normal limits. Appendix appears normal (series 7, image 159). No evidence of bowel wall thickening, distention, or inflammatory changes. Moderate volume stool within the colon. Lymphatic: No abdominopelvic lymphadenopathy. Reproductive: Uterus and bilateral adnexa are unremarkable. Other: No free fluid. No abdominopelvic fluid collection. No pneumoperitoneum. No abdominal wall hernia. Musculoskeletal: No acute or significant osseous findings. Review of the MIP images confirms the above  findings. IMPRESSION: Vascular findings: 1. Negative for aortic aneurysm or dissection. 2. Small wedge-shaped areas of decreased enhancement within the inferior poles of the right kidney and left kidney, concerning for areas of focal renal infarction. Pyelonephritis could have a similar appearance. Correlation with urinalysis is recommended. 3. Focal 11 x 5 mm area of noncalcified atherosclerotic plaque with a slightly polypoid configuration projecting into the lumen of the distal descending thoracic aorta. This may be at risk for potential plaque thrombosis. Nonvascular findings: 1. Findings of acute uncomplicated pancreatitis. No evidence of pancreatic necrosis or peripancreatic fluid collection. Correlate with serum lipase. 2. Findings suggestive of hepatic steatosis. 3. Indeterminate 1.8 cm rounded area of hypoattenuation within the anterior aspect of the spleen. Further evaluation with contrast-enhanced MRI can be performed on a nonemergent basis. 4. Slightly mottled appearance of the left third rib anteriorly with some adjacent scarring in the adjacent lung, potentially representing post radiation changes. 5. Left breast skin thickening with suspected lumpectomy site at the inner left breast. Continued mammographic follow-up is recommended. Electronically Signed   By: Davina Poke D.O.   On: 08/20/2020 11:40   US Abdomen Limited RUQ (LIVER/GB)  Result Date: 08/20/2020 CLINICAL DATA:  Pancreatitis.  Right upper quadrant pain.  Nausea. EXAM: ULTRASOUND ABDOMEN LIMITED RIGHT UPPER QUADRANT COMPARISON:  Dissection protocol CT earlier today. FINDINGS: Gallbladder: Surgically absent. Common bile duct: Diameter: 4 mm, nondilated. Liver: Heterogeneously increased in parenchymal echogenicity. Liver parenchyma is difficult to penetrate. No focal abnormalities seen by ultrasound. Portal vein is patent on color Doppler imaging with normal direction of blood flow towards the liver. Other: No right upper quadrant  ascites. IMPRESSION: 1. Post cholecystectomy without biliary dilatation. 2. Heterogeneous hepatic parenchyma which is difficult to penetrate, typical of steatosis. Electronically Signed   By: Keith Rake M.D.   On: 08/20/2020 19:46     Scheduled Meds: . aspirin EC  81 mg Oral Daily  . atorvastatin  40 mg Oral Daily  . insulin aspart  0-15 Units Subcutaneous TID WC  . insulin glargine  6 Units Subcutaneous QHS  . metoprolol tartrate  12.5 mg Oral BID  . nicotine  21 mg Transdermal Daily  . pantoprazole (PROTONIX) IV  40 mg Intravenous Q12H  . QUEtiapine  800 mg Oral QHS  . sodium chloride flush  3 mL Intravenous Q12H  . sodium chloride flush  3 mL Intravenous Q12H   Continuous Infusions: . sodium chloride    . sodium chloride    . heparin 1,250 Units/hr (08/21/20 1821)  . potassium chloride 10 mEq (08/22/20 0912)     LOS: 2 days   Time Spent in minutes   45 minutes  Aleighya Mcanelly D.O. on 08/22/2020 at 9:41 AM  Between 7am to 7pm - Please see pager noted on [email or web address]  After 7pm go to [email or web address]  And look for the night coverage person covering for me after hours  [facility] Office  [phone]

## 2020-08-22 NOTE — Progress Notes (Signed)
BS 141, coverage insulin 2 units per order, patient refused stating she is afraid her "sugar will drop" because of NPO status and procedure today.  Teaching provided, refused insulin injection.

## 2020-08-22 NOTE — Progress Notes (Signed)
Hypoglycemic Event  CBG: 59  Treatment: 1/2 amp D50  Symptoms: drowsy  Follow-up CBG: Time: 0041 CBG Result: 126   Possible Reasons for Event: NPO  Comments/MD notified: protocol followed.    [place]

## 2020-08-22 NOTE — Progress Notes (Signed)
Hypoglycemic Event  CBG: 66  Treatment: 1/2 amp d50  Symptoms: asymptomatic   Follow-up CBG: Time: 0439 CBG Result: 141  Possible Reasons for Event: NPO  Comments/MD notified: protocol followed.     [place]

## 2020-08-23 DIAGNOSIS — K859 Acute pancreatitis without necrosis or infection, unspecified: Secondary | ICD-10-CM

## 2020-08-23 DIAGNOSIS — I214 Non-ST elevation (NSTEMI) myocardial infarction: Secondary | ICD-10-CM | POA: Diagnosis not present

## 2020-08-23 LAB — COMPREHENSIVE METABOLIC PANEL
ALT: 15 U/L (ref 0–44)
AST: 13 U/L — ABNORMAL LOW (ref 15–41)
Albumin: 2.8 g/dL — ABNORMAL LOW (ref 3.5–5.0)
Alkaline Phosphatase: 60 U/L (ref 38–126)
Anion gap: 9 (ref 5–15)
BUN: 5 mg/dL — ABNORMAL LOW (ref 6–20)
CO2: 22 mmol/L (ref 22–32)
Calcium: 9 mg/dL (ref 8.9–10.3)
Chloride: 106 mmol/L (ref 98–111)
Creatinine, Ser: 0.77 mg/dL (ref 0.44–1.00)
GFR, Estimated: >60 mL/min (ref >60)
Glucose, Bld: 173 mg/dL — ABNORMAL HIGH (ref 70–99)
Potassium: 3.8 mmol/L (ref 3.5–5.1)
Sodium: 137 mmol/L (ref 135–145)
Total Bilirubin: 0.8 mg/dL (ref 0.3–1.2)
Total Protein: 5.8 g/dL — ABNORMAL LOW (ref 6.5–8.1)

## 2020-08-23 LAB — GLUCOSE, CAPILLARY
Glucose-Capillary: 146 mg/dL — ABNORMAL HIGH (ref 70–99)
Glucose-Capillary: 218 mg/dL — ABNORMAL HIGH (ref 70–99)
Glucose-Capillary: 170 mg/dL — ABNORMAL HIGH (ref 70–99)

## 2020-08-23 LAB — CBC
HCT: 35.2 % — ABNORMAL LOW (ref 36.0–46.0)
Hemoglobin: 11.9 g/dL — ABNORMAL LOW (ref 12.0–15.0)
MCH: 30.6 pg (ref 26.0–34.0)
MCHC: 33.8 g/dL (ref 30.0–36.0)
MCV: 90.5 fL (ref 80.0–100.0)
Platelets: 273 10*3/uL (ref 150–400)
RBC: 3.89 MIL/uL (ref 3.87–5.11)
RDW: 14 % (ref 11.5–15.5)
WBC: 5.1 10*3/uL (ref 4.0–10.5)
nRBC: 0 % (ref 0.0–0.2)

## 2020-08-23 LAB — LIPASE, BLOOD: Lipase: 48 U/L (ref 11–51)

## 2020-08-23 LAB — MAGNESIUM: Magnesium: 1.5 mg/dL — ABNORMAL LOW (ref 1.7–2.4)

## 2020-08-23 MED ORDER — METOPROLOL TARTRATE 12.5 MG HALF TABLET
12.5000 mg | ORAL_TABLET | Freq: Once | ORAL | Status: AC
  Administered 2020-08-23: 12.5 mg via ORAL
  Filled 2020-08-23: qty 1

## 2020-08-23 MED ORDER — POTASSIUM CHLORIDE IN NACL 20-0.9 MEQ/L-% IV SOLN
INTRAVENOUS | Status: DC
  Filled 2020-08-23 (×5): qty 1000

## 2020-08-23 MED ORDER — MAGNESIUM SULFATE 4 GM/100ML IV SOLN
4.0000 g | Freq: Once | INTRAVENOUS | Status: AC
  Administered 2020-08-23: 4 g via INTRAVENOUS
  Filled 2020-08-23: qty 100

## 2020-08-23 MED ORDER — METOPROLOL SUCCINATE ER 25 MG PO TB24
25.0000 mg | ORAL_TABLET | Freq: Every day | ORAL | Status: DC
  Administered 2020-08-24 – 2020-08-25 (×2): 25 mg via ORAL
  Filled 2020-08-23 (×2): qty 1

## 2020-08-23 NOTE — Progress Notes (Signed)
PROGRESS NOTE    Ariana Herrera   XNT:[id number]  DOB: 11/26/1965  DOA: 08/20/2020 PCP: Dulce Sellar, MD   Brief Narrative:  Ariana Herrera a 54 y.o.femalewith medical history significant ofCAD andbreastcancertreatedwith chemo/rads and with residual lymphedema presenting with abdominal pain.She reports she was awakened overnight with severe abdominal pain. It got worse and worse for about 2 hours and her boyfriend called 911. +n/v. She felt ok yesterday with mild cramping. She is still having severe substernal pain that radiates into her back. Nothing makes it worse, better with pain meds but it is short-lived. No h/o similar prior. She is very nauseated. She did have an MI in the past (maybe 4 years ago) - presented with chest discomfort, told it might be GERD and they discharged her. About 1-2 days later she went back and they said a stent was not needed but it was a heart attack.   Of note, she was seen at Urgent Care 2 days ago with R sided shoulder pain and chest pain. She took NTG x 2 for this due to concern that it was a heart attack. She reports being told that it was a shoulder strain for which she was given prednisone. She took the 60 mg prednisone yesterday for the first time and symptoms developed overnight. Her only other pain medication was Norco, which she takes regularly for pain. Denies NSAIDs use.    Subjective: Having abdominal pain radiating to her back after eating.     Assessment & Plan:   Principal Problem:   NSTEMI (non-ST elevated myocardial infarction)   - vs related to acute pancreatitis  Ref. Range 08/20/2020 10:27 08/20/2020 13:32 08/20/2020 15:03  Troponin I (High Sensitivity) Latest Ref Range: <18 ng/L 1,534 (HH) 1,529 (HH) 1,415 [facility])  Cardiac Cath 10/29:  Prox RCA lesion is 30% stenosed.  Mid RCA lesion is 40% stenosed.  Mid RCA to Dist RCA lesion is 20% stenosed.  Ost LAD to Prox LAD lesion is 20%  stenosed.   1. Mild non-obstructive CAD 2. Elevated left filling pressures.  - ECHO noted below is unremarkable - no symptoms or signs of vasculitis/ sepsis/ shock/ rhabdomyolysis/ CVA - no history of Cocaine abuse  - no further work up at this time per Cards- will f/u as outpt  Active Problems:     Pancreatitis, acute - presenting with abdominal pain- CT correlated with acute pancreatitis and fatty liver (No h/o ETOH abuse) - having ongoing pain after being started on diet and asking for pain medications - downgrade to NPO- place on maintenance fluids  Hypomagnesemia - replaced- recheck tomorrow  Fatty liver  - discussed findings and discussed need for weigh loss per patient  Dyslipidemia- cont Tricor - also related to uncontrolled DM Total CHOL/HDL Ratio    4.7           Total CHOL/HDL Ratio     Cholesterol    135           Cholesterol    HDL Cholesterol    29           HDL Cholesterol    LDL (calc)    61           LDL (calc)    Triglycerides    227           Triglycerides    VLDL    45           VLDL    CBC  Splenic lesion - see CTA report below - non urgent outpatient MRI can be pursued     Diabetes mellitus type 2 in obese ([place])   - Uncontrolled   - A1c 10.8 - on Metformin at home- will need hold stop Trulicity - cont Lantus- she may benefit from short acting insulin with meals - I will ask her to follow closely with PCP for further management     Breast cancer ([place])  - cont outpt follow up  Time spent in minutes: 35 DVT prophylaxis: SCD's Start: 08/22/20 1322  Code Status: full code Family Communication:  Disposition Plan:  Status is: Inpatient  Remains inpatient appropriate because:acute pancreatitis   Dispo: The patient is from: Home              Anticipated d/c is to: Home              Anticipated d/c date is: > 3 days              Patient currently is not medically stable to d/c.      Consultants:   cardiology Procedures:  10/28-  2 D ECHO 1. Left ventricular ejection fraction, by estimation, is 60 to 65%. The  left ventricle has normal function. The left ventricle has no regional  wall motion abnormalities. Left ventricular diastolic parameters were  normal.  2. Right ventricular systolic function is normal. The right ventricular  size is normal. Tricuspid regurgitation signal is inadequate for assessing  PA pressure.  3. The mitral valve is normal in structure. Trivial mitral valve  regurgitation. No evidence of mitral stenosis.  4. The aortic valve is tricuspid. Aortic valve regurgitation is not  visualized. No aortic stenosis is present.  5. The inferior vena cava is normal in size with greater than 50%  respiratory variability, suggesting right atrial pressure of 3 mmHg.  Antimicrobials:  Anti-infectives (From admission, onward)   None       Objective: Vitals:   08/22/20 2104 08/23/20 0102 08/23/20 0548 08/23/20 0806  BP: 136/74 112/64 118/70 124/69  Pulse: 94 96 89 98  Resp: 18 15 17 18   Temp: 98.8 F (37.1 C) 98.7 F (37.1 C) 98.7 F (37.1 C) 99.8 F (37.7 C)  TempSrc: Oral Oral Oral Oral  SpO2: 93% 91% 94% 93%  Weight:  73.8 kg    Height:        Intake/Output Summary (Last 24 hours) at 08/23/2020 0934 Last data filed at 08/23/2020 0600 Gross per 24 hour  Intake 957 ml  Output 3100 ml  Net -2143 ml   Filed Weights   08/21/20 0427 08/22/20 0228 08/23/20 0102  Weight: 72.7 kg 74.3 kg 73.8 kg    Examination: General exam: Appears comfortable  HEENT: PERRLA, oral mucosa moist, no sclera icterus or thrush Respiratory system: Clear to auscultation. Respiratory effort normal. Cardiovascular system: S1 & S2 heard, RRR.   Gastrointestinal system: Abdomen soft,tender in epigastrium, nondistended. Normal bowel sounds. Central nervous system: Alert and oriented. No focal neurological deficits. Extremities: No cyanosis, clubbing or edema Skin: No rashes or ulcers Psychiatry:  Mood &  affect appropriate.     Data Reviewed: I have personally reviewed following labs and imaging studies  CBC: Recent Labs  Lab 08/20/20 1027 08/20/20 1040 08/21/20 0558 08/22/20 0016 08/23/20 0558  WBC 12.8*  --  9.2 6.3 5.1  HGB 13.2 13.6 12.2 10.7* 11.9*  HCT 38.3 40.0 35.7* 31.5* 35.2*  MCV 89.3  --  88.8 89.2 90.5  PLT 408*  --  327 263 671   Basic Metabolic Panel: Recent Labs  Lab 08/20/20 1040 08/20/20 1503 08/20/20 1547 08/21/20 0558 08/22/20 0016 08/23/20 0558  NA 135  --   --  136 137 137  K 3.9  --   --  3.6 3.3* 3.8  CL 101  --   --  106 106 106  CO2  --   --   --  24 22 22   GLUCOSE 334*  --   --  103* 68* 173*  BUN 14  --   --  9 6 5*  CREATININE 0.60  --   --  0.71 0.66 0.77  CALCIUM  --   --   --  8.6* 8.6* 9.0  MG  --  1.5* 1.3* 1.7 1.5* 1.5*   GFR: Estimated Creatinine Clearance: 78.3 mL/min (by C-G formula based on SCr of 0.77 mg/dL). Liver Function Tests: Recent Labs  Lab 08/20/20 1223 08/23/20 0558  AST 23 13*  ALT 23 15  ALKPHOS 72 60  BILITOT 0.9 0.8  PROT 6.1* 5.8*  ALBUMIN 3.4* 2.8*   Recent Labs  Lab 08/20/20 1223 08/22/20 0016 08/23/20 0558  LIPASE 148* 57* 48   No results for input(s): AMMONIA in the last 168 hours. Coagulation Profile: Recent Labs  Lab 08/20/20 1027 08/21/20 0558  INR 1.0 1.1   Cardiac Enzymes: No results for input(s): CKTOTAL, CKMB, CKMBINDEX, TROPONINI in the last 168 hours. BNP (last 3 results) No results for input(s): PROBNP in the last 8760 hours. HbA1C: Recent Labs    08/20/20 1421  HGBA1C 10.8*   CBG: Recent Labs  Lab 08/22/20 0743 08/22/20 1106 08/22/20 1607 08/22/20 2050 08/23/20 0638  GLUCAP 123* 104* 189* 98 146*   Lipid Profile: Recent Labs    08/20/20 1547 08/21/20 0558  CHOL 170 135  HDL 38* 29*  LDLCALC 101* 61  TRIG 153* 227*  CHOLHDL 4.5 4.7   Thyroid Function Tests: Recent Labs    08/20/20 1547  TSH 1.011  FREET4 0.75   Anemia Panel: No results for  input(s): VITAMINB12, FOLATE, FERRITIN, TIBC, IRON, RETICCTPCT in the last 72 hours. Urine analysis: No results found for: COLORURINE, APPEARANCEUR, LABSPEC, PHURINE, GLUCOSEU, HGBUR, BILIRUBINUR, KETONESUR, PROTEINUR, UROBILINOGEN, NITRITE, LEUKOCYTESUR Sepsis Labs: @LABRCNTIP (procalcitonin:4,lacticidven:4) ) Recent Results (from the past 240 hour(s))  Respiratory Panel by RT PCR (Flu A&B, Covid) - Nasopharyngeal Swab     Status: None   Collection Time: 08/20/20 11:47 AM   Specimen: Nasopharyngeal Swab  Result Value Ref Range Status   SARS Coronavirus 2 by RT PCR NEGATIVE NEGATIVE Final    Comment: (NOTE) SARS-CoV-2 target nucleic acids are NOT DETECTED.  The SARS-CoV-2 RNA is generally detectable in upper respiratoy specimens during the acute phase of infection. The lowest concentration of SARS-CoV-2 viral copies this assay can detect is 131 copies/mL. A negative result does not preclude SARS-Cov-2 infection and should not be used as the sole basis for treatment or other patient management decisions. A negative result may occur with  improper specimen collection/handling, submission of specimen other than nasopharyngeal swab, presence of viral mutation(s) within the areas targeted by this assay, and inadequate number of viral copies (<131 copies/mL). A negative result must be combined with clinical observations, patient history, and epidemiological information. The expected result is Negative.  Fact Sheet for Patients:  PinkCheek.be  Fact Sheet for Healthcare Providers:  GravelBags.it  This test is no t yet approved or cleared by the [place] and  has been authorized for detection and/or diagnosis of SARS-CoV-2 by FDA under an Emergency Use Authorization (EUA). This EUA will remain  in effect (meaning this test can be used) for the duration of the COVID-19 declaration under Section 564(b)(1) of the Act, 21  U.S.C. section 360bbb-3(b)(1), unless the authorization is terminated or revoked sooner.     Influenza A by PCR NEGATIVE NEGATIVE Final   Influenza B by PCR NEGATIVE NEGATIVE Final    Comment: (NOTE) The Xpert Xpress SARS-CoV-2/FLU/RSV assay is intended as an aid in  the diagnosis of influenza from Nasopharyngeal swab specimens and  should not be used as a sole basis for treatment. Nasal washings and  aspirates are unacceptable for Xpert Xpress SARS-CoV-2/FLU/RSV  testing.  Fact Sheet for Patients: PinkCheek.be  Fact Sheet for Healthcare Providers: GravelBags.it  This test is not yet approved or cleared by the [place] FDA and  has been authorized for detection and/or diagnosis of SARS-CoV-2 by  FDA under an Emergency Use Authorization (EUA). This EUA will remain  in effect (meaning this test can be used) for the duration of the  Covid-19 declaration under Section 564(b)(1) of the Act, 21  U.S.C. section 360bbb-3(b)(1), unless the authorization is  terminated or revoked. Performed at [facility], [place] [address] [place], [place] [address]          Radiology Studies: CARDIAC CATHETERIZATION  Result Date: 08/22/2020  Prox RCA lesion is 30% stenosed.  Mid RCA lesion is 40% stenosed.  Mid RCA to Dist RCA lesion is 20% stenosed.  Ost LAD to Prox LAD lesion is 20% stenosed.  1. Mild non-obstructive CAD 2. Elevated left filling pressures. Recommendations: Medical management of mild CAD   CT CARDIAC SCORING  Addendum Date: 08/21/2020   ADDENDUM REPORT: 08/21/2020 18:20 EXAM: OVER-READ INTERPRETATION  CT CHEST The following report is an over-read performed by radiologist Dr. Alvino Blood [facility] on 08/21/2020. This over-read does not include interpretation of cardiac or coronary anatomy or pathology. The calcium score interpretation by the cardiologist is attached. COMPARISON:  CT chest  08/20/2020 FINDINGS: Limited view of the lung parenchyma demonstrates no suspicious nodularity. Airways are normal. Limited view of the mediastinum demonstrates no adenopathy. Esophagus normal. Limited view of the upper abdomen unremarkable. Limited view of the skeleton and chest wall is unremarkable. IMPRESSION: No significant extracardiac findings. Electronically Signed   By: Suzy Bouchard M.D.   On: 08/21/2020 18:20   Result Date: 08/21/2020 CLINICAL DATA:  Risk stratification EXAM: Coronary Calcium Score TECHNIQUE: The patient was scanned on a [employer]. Axial non-contrast 3 mm slices were carried out through the heart. The data set was analyzed on a dedicated work station and scored using the [place]. FINDINGS: Non-cardiac: See separate report from [facility]. Ascending Aorta: Normal caliber.  Atherosclerosis. Pericardium: Normal Coronary arteries: Normal origins.  Multivessel CAC. IMPRESSION: Coronary calcium score of 35. This was 94th percentile for age and sex matched control. Electronically Signed: By: Pixie Casino M.D. On: 08/21/2020 16:27      Scheduled Meds: . aspirin EC  81 mg Oral Daily  . atorvastatin  40 mg Oral Daily  . insulin aspart  0-15 Units Subcutaneous TID WC  . insulin glargine  6 Units Subcutaneous QHS  . metoprolol succinate  25 mg Oral Daily  . nicotine  21 mg Transdermal Daily  . pantoprazole (PROTONIX) IV  40 mg Intravenous Q12H  . pneumococcal 23 valent vaccine  0.5 mL Intramuscular Tomorrow-1000  .  QUEtiapine  800 mg Oral QHS  . sodium chloride flush  3 mL Intravenous Q12H  . sodium chloride flush  3 mL Intravenous Q12H  . sodium chloride flush  3 mL Intravenous Q12H   Continuous Infusions: . sodium chloride    . sodium chloride    . magnesium sulfate bolus IVPB       LOS: 3 days      Debbe Odea, MD [facility] Pager: [email or web address] 08/23/2020, 9:34 AM

## 2020-08-23 NOTE — Progress Notes (Signed)
Progress Note  Patient Name: Ariana Herrera Date of Encounter: 08/23/2020  [facility] Cardiologist: Pixie Casino, MD   Subjective  Feels better this morning. Epigastric pain improved. Hoping to go home today.  Patient unable to tolerate CTA due to inability to control HR without causing hypotension. Ca score was 35 which was 94% for age and sex matched control. Given location in LM/LAD and inability to tolerate CTA, decision was made to proceed with cath yesterday.  Cath revealed non-obstructive CAD.  Inpatient Medications    Scheduled Meds: . aspirin EC  81 mg Oral Daily  . atorvastatin  40 mg Oral Daily  . insulin aspart  0-15 Units Subcutaneous TID WC  . insulin glargine  6 Units Subcutaneous QHS  . metoprolol tartrate  12.5 mg Oral BID  . nicotine  21 mg Transdermal Daily  . pantoprazole (PROTONIX) IV  40 mg Intravenous Q12H  . pneumococcal 23 valent vaccine  0.5 mL Intramuscular Tomorrow-1000  . QUEtiapine  800 mg Oral QHS  . sodium chloride flush  3 mL Intravenous Q12H  . sodium chloride flush  3 mL Intravenous Q12H  . sodium chloride flush  3 mL Intravenous Q12H   Continuous Infusions: . sodium chloride    . sodium chloride    . magnesium sulfate bolus IVPB     PRN Meds: sodium chloride, sodium chloride, acetaminophen, HYDROmorphone (DILAUDID) injection, nitroGLYCERIN, ondansetron (ZOFRAN) IV, sodium chloride flush, sodium chloride flush   Vital Signs    Vitals:   08/22/20 2104 08/23/20 0102 08/23/20 0548 08/23/20 0806  BP: 136/74 112/64 118/70 124/69  Pulse: 94 96 89 98  Resp: 18 15 17 18   Temp: 98.8 F (37.1 C) 98.7 F (37.1 C) 98.7 F (37.1 C) 99.8 F (37.7 C)  TempSrc: Oral Oral Oral Oral  SpO2: 93% 91% 94% 93%  Weight:  73.8 kg    Height:        Intake/Output Summary (Last 24 hours) at 08/23/2020 0900 Last data filed at 08/23/2020 0600 Gross per 24 hour  Intake 957 ml  Output 3750 ml  Net -2793 ml   Last 3 Weights 08/23/2020  08/22/2020 08/21/2020  Weight (lbs) 162 lb 11.2 oz 163 lb 14.4 oz 160 lb 3.2 oz  Weight (kg) 73.8 kg 74.345 kg 72.666 kg      Telemetry    NSR - Personally Reviewed  ECG    No new ECG - Personally Reviewed  Physical Exam   GEN: No acute distress. Laying comfortably in bed.  Neck: No JVD Cardiac: RRR, no murmurs, rubs, or gallops.  Respiratory: Clear to auscultation bilaterally. GI: Soft, nontender, non-distended  MS: No edema; No deformity. Neuro:  Nonfocal  Psych: Normal affect   Labs    High Sensitivity Troponin:   Recent Labs  Lab 08/20/20 1027 08/20/20 1332 08/20/20 1503  TROPONINIHS 1,534* 1,529* 1,415*      Chemistry Recent Labs  Lab 08/20/20 1040 08/20/20 1223 08/21/20 0558 08/22/20 0016 08/23/20 0558  NA   < >  --  136 137 137  K   < >  --  3.6 3.3* 3.8  CL   < >  --  106 106 106  CO2  --   --  24 22 22   GLUCOSE   < >  --  103* 68* 173*  BUN   < >  --  9 6 5*  CREATININE   < >  --  0.71 0.66 0.77  CALCIUM  --   --  8.6* 8.6* 9.0  PROT  --  6.1*  --   --  5.8*  ALBUMIN  --  3.4*  --   --  2.8*  AST  --  23  --   --  13*  ALT  --  23  --   --  15  ALKPHOS  --  72  --   --  60  BILITOT  --  0.9  --   --  0.8  GFRNONAA  --   --  >60 >60 >60  ANIONGAP  --   --  6 9 9    < > = values in this interval not displayed.     Hematology Recent Labs  Lab 08/21/20 0558 08/22/20 0016 08/23/20 0558  WBC 9.2 6.3 5.1  RBC 4.02 3.53* 3.89  HGB 12.2 10.7* 11.9*  HCT 35.7* 31.5* 35.2*  MCV 88.8 89.2 90.5  MCH 30.3 30.3 30.6  MCHC 34.2 34.0 33.8  RDW 13.9 14.0 14.0  PLT 327 263 273    BNPNo results for input(s): BNP, PROBNP in the last 168 hours.   DDimer No results for input(s): DDIMER in the last 168 hours.   Radiology    CARDIAC CATHETERIZATION  Result Date: 08/22/2020  Prox RCA lesion is 30% stenosed.  Mid RCA lesion is 40% stenosed.  Mid RCA to Dist RCA lesion is 20% stenosed.  Ost LAD to Prox LAD lesion is 20% stenosed.  1. Mild  non-obstructive CAD 2. Elevated left filling pressures. Recommendations: Medical management of mild CAD   CT CARDIAC SCORING  Addendum Date: 08/21/2020   ADDENDUM REPORT: 08/21/2020 18:20 EXAM: OVER-READ INTERPRETATION  CT CHEST The following report is an over-read performed by radiologist Dr. Alvino Blood [facility] on 08/21/2020. This over-read does not include interpretation of cardiac or coronary anatomy or pathology. The calcium score interpretation by the cardiologist is attached. COMPARISON:  CT chest 08/20/2020 FINDINGS: Limited view of the lung parenchyma demonstrates no suspicious nodularity. Airways are normal. Limited view of the mediastinum demonstrates no adenopathy. Esophagus normal. Limited view of the upper abdomen unremarkable. Limited view of the skeleton and chest wall is unremarkable. IMPRESSION: No significant extracardiac findings. Electronically Signed   By: Suzy Bouchard M.D.   On: 08/21/2020 18:20   Result Date: 08/21/2020 CLINICAL DATA:  Risk stratification EXAM: Coronary Calcium Score TECHNIQUE: The patient was scanned on a [employer]. Axial non-contrast 3 mm slices were carried out through the heart. The data set was analyzed on a dedicated work station and scored using the [place]. FINDINGS: Non-cardiac: See separate report from [facility]. Ascending Aorta: Normal caliber.  Atherosclerosis. Pericardium: Normal Coronary arteries: Normal origins.  Multivessel CAC. IMPRESSION: Coronary calcium score of 35. This was 94th percentile for age and sex matched control. Electronically Signed: By: Pixie Casino M.D. On: 08/21/2020 16:27    Cardiac Studies   Coronary angiogram 08/22/20:  Prox RCA lesion is 30% stenosed.  Mid RCA lesion is 40% stenosed.  Mid RCA to Dist RCA lesion is 20% stenosed.  Ost LAD to Prox LAD lesion is 20% stenosed.   1. Mild non-obstructive CAD 2. Elevated left filling pressures.    Recommendations: Medical management of mild CAD  TTE 08/21/20: FINDINGS  Left Ventricle: Left ventricular ejection fraction, by estimation, is 60  to 65%. The left ventricle has normal function. The left ventricle has no  regional wall motion abnormalities. The left ventricular internal cavity  size was normal in size. There is  no left ventricular hypertrophy. Left ventricular diastolic parameters  were normal.   Right Ventricle: The right ventricular size is normal. No increase in  right ventricular wall thickness. Right ventricular systolic function is  normal. Tricuspid regurgitation signal is inadequate for assessing PA  pressure.   Left Atrium: Left atrial size was normal in size.   Right Atrium: Right atrial size was normal in size.   Pericardium: There is no evidence of pericardial effusion.   Mitral Valve: The mitral valve is normal in structure. Trivial mitral  valve regurgitation. No evidence of mitral valve stenosis.   Tricuspid Valve: The tricuspid valve is normal in structure. Tricuspid  valve regurgitation is not demonstrated.   Aortic Valve: The aortic valve is tricuspid. Aortic valve regurgitation is  not visualized. No aortic stenosis is present.   Pulmonic Valve: The pulmonic valve was normal in structure. Pulmonic valve  regurgitation is not visualized.   Aorta: The aortic root is normal in size and structure.   Venous: The inferior vena cava is normal in size with greater than 50%  respiratory variability, suggesting right atrial pressure of 3 mmHg.   IAS/Shunts: No atrial level shunt detected by color flow Doppler.   CTA chest/abdomen/pelvis 08/20/20: NON-VASCULAR  Hepatobiliary: Diffusely decreased attenuation of the hepatic parenchyma. No focal hepatic lesion is identified on arterial phase imaging. Status post cholecystectomy. No biliary dilatation.  Pancreas: There is peripancreatic fat stranding around the pancreatic head and  proximal body (series 7, image 93). No pancreatic ductal dilatation. No evidence of parenchymal non enhancement. No peripancreatic fluid collection.  Spleen: 1.8 x 0.9 cm rounded area of hypoattenuation within the anterior aspect of the spleen (series 7, image 84; series 12, image 160) measuring greater than fluid density. Spleen is otherwise within normal limits. There are 2 small splenules in the region of the splenic hilum.  Adrenals/Urinary Tract: No adrenal nodules or masses. There are wedge-shaped areas of decreased enhancement within the inferior pole of the right kidney (series 10, images 117 and 121). Possible tiny area of hypoattenuating cortex in the inferior pole of the left kidney (series 10, image 119). Kidneys have otherwise symmetric enhancement. No enhancing renal mass, stone, or hydronephrosis. Urinary bladder unremarkable.  Stomach/Bowel: Small hiatal hernia. Stomach is otherwise within normal limits. Appendix appears normal (series 7, image 159). No evidence of bowel wall thickening, distention, or inflammatory changes. Moderate volume stool within the colon.  Lymphatic: No abdominopelvic lymphadenopathy.  Reproductive: Uterus and bilateral adnexa are unremarkable.  Other: No free fluid. No abdominopelvic fluid collection. No pneumoperitoneum. No abdominal wall hernia.  Musculoskeletal: No acute or significant osseous findings.  Review of the MIP images confirms the above findings.  IMPRESSION: Vascular findings:  1. Negative for aortic aneurysm or dissection. 2. Small wedge-shaped areas of decreased enhancement within the inferior poles of the right kidney and left kidney, concerning for areas of focal renal infarction. Pyelonephritis could have a similar appearance. Correlation with urinalysis is recommended. 3. Focal 11 x 5 mm area of noncalcified atherosclerotic plaque with a slightly polypoid configuration projecting into the lumen of  the distal descending thoracic aorta. This may be at risk for potential plaque thrombosis.  Nonvascular findings:  1. Findings of acute uncomplicated pancreatitis. No evidence of pancreatic necrosis or peripancreatic fluid collection. Correlate with serum lipase. 2. Findings suggestive of hepatic steatosis. 3. Indeterminate 1.8 cm rounded area of hypoattenuation within the anterior aspect of the spleen. Further evaluation with contrast-enhanced MRI can be performed on a nonemergent basis.  4. Slightly mottled appearance of the left third rib anteriorly with some adjacent scarring in the adjacent lung, potentially representing post radiation changes. 5. Left breast skin thickening with suspected lumpectomy site at the inner left breast. Continued mammographic follow-up is recommended.   Patient Profile     54 y.o. 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 Now on medical management.   Assessment & Plan    #Nonobstructive CAD: #NSTEMI Patient presented with epigastric pain that woke her from sleep with concern for UA. Attempted CTA, however, patient was unable to tolerate as became hypotensive with medication administered to control her HR. Ca score, however, was 35 (LAD and LM) which was 96% for age-matched gender controls. Given elevated Ca score, symptoms and risk factors, decision was made to proceed with cath which demonstrated non-obstructive CAD. Now on medical management. -Continue ASA 81mg  daily -Continue atorvastatin 40mg  daily -Change to metop succinate 25mg  XL -Would benefit from ACE/ARB, can add as out-patient -Okay to discharge home from CV perspective  #Epigastric Pain #Possible  pancreatitis: Patient presented with epigastric pain and elevated lipase with concern for possible pancreatitis. Now improving and tolerating full diet. -Management per primary team  #HLD: LDL 61, HDL 29, TG 227. -Continue atorvastatin 40mg  daily  Cardiology will sign off at this time. Okay to discharge home from CV perspective with follow-up with Dr. Debara Pickett.     For questions or updates, please contact [place] Please consult [email or web address] for contact info under        Signed, Freada Bergeron, MD  08/23/2020, 9:00 AM

## 2020-08-24 DIAGNOSIS — I214 Non-ST elevation (NSTEMI) myocardial infarction: Secondary | ICD-10-CM | POA: Diagnosis not present

## 2020-08-24 LAB — BASIC METABOLIC PANEL
Anion gap: 8 (ref 5–15)
BUN: 5 mg/dL — ABNORMAL LOW (ref 6–20)
CO2: 23 mmol/L (ref 22–32)
Calcium: 8.6 mg/dL — ABNORMAL LOW (ref 8.9–10.3)
Chloride: 105 mmol/L (ref 98–111)
Creatinine, Ser: 0.64 mg/dL (ref 0.44–1.00)
GFR, Estimated: >60 mL/min (ref >60)
Glucose, Bld: 285 mg/dL — ABNORMAL HIGH (ref 70–99)
Potassium: 5.1 mmol/L (ref 3.5–5.1)
Sodium: 136 mmol/L (ref 135–145)

## 2020-08-24 LAB — GLUCOSE, CAPILLARY
Glucose-Capillary: 119 mg/dL — ABNORMAL HIGH (ref 70–99)
Glucose-Capillary: 118 mg/dL — ABNORMAL HIGH (ref 70–99)
Glucose-Capillary: 220 mg/dL — ABNORMAL HIGH (ref 70–99)
Glucose-Capillary: 167 mg/dL — ABNORMAL HIGH (ref 70–99)
Glucose-Capillary: 162 mg/dL — ABNORMAL HIGH (ref 70–99)

## 2020-08-24 LAB — MAGNESIUM: Magnesium: 1.3 mg/dL — ABNORMAL LOW (ref 1.7–2.4)

## 2020-08-24 MED ORDER — FLEET ENEMA 7-19 GM/118ML RE ENEM
1.0000 | ENEMA | Freq: Once | RECTAL | Status: AC
  Administered 2020-08-24: 1 via RECTAL
  Filled 2020-08-24: qty 1

## 2020-08-24 MED ORDER — POLYETHYLENE GLYCOL 3350 17 G PO PACK
17.0000 g | PACK | Freq: Every day | ORAL | Status: DC
  Administered 2020-08-24 – 2020-08-25 (×2): 17 g via ORAL
  Filled 2020-08-24 (×2): qty 1

## 2020-08-24 MED ORDER — LOSARTAN POTASSIUM 25 MG PO TABS
25.0000 mg | ORAL_TABLET | Freq: Every day | ORAL | Status: DC
  Administered 2020-08-24 – 2020-08-25 (×2): 25 mg via ORAL
  Filled 2020-08-24 (×2): qty 1

## 2020-08-24 MED ORDER — PNEUMOCOCCAL VAC POLYVALENT 25 MCG/0.5ML IJ INJ: 0.5000 mL | INJECTION | INTRAMUSCULAR | Status: DC

## 2020-08-24 MED ORDER — BISACODYL 10 MG RE SUPP
10.0000 mg | Freq: Once | RECTAL | Status: AC
  Administered 2020-08-24: 10 mg via RECTAL
  Filled 2020-08-24: qty 1

## 2020-08-24 MED ORDER — INSULIN ASPART 100 UNIT/ML ~~LOC~~ SOLN
0.0000 [IU] | Freq: Three times a day (TID) | SUBCUTANEOUS | Status: DC
  Administered 2020-08-25 (×2): 3 [IU] via SUBCUTANEOUS
  Administered 2020-08-25: 5 [IU] via SUBCUTANEOUS

## 2020-08-24 NOTE — Progress Notes (Signed)
Progress Note  Patient Name: Ariana Herrera Date of Encounter: 08/24/2020  [facility] Cardiologist: Pixie Casino, MD   Subjective   Continues to have epigastric pain but improving. No chest pain. Cath access site in right wrist is c/d/i  Inpatient Medications    Scheduled Meds: . aspirin EC  81 mg Oral Daily  . atorvastatin  40 mg Oral Daily  . insulin aspart  0-15 Units Subcutaneous TID WC  . insulin glargine  6 Units Subcutaneous QHS  . metoprolol succinate  25 mg Oral Daily  . nicotine  21 mg Transdermal Daily  . pantoprazole (PROTONIX) IV  40 mg Intravenous Q12H  . pneumococcal 23 valent vaccine  0.5 mL Intramuscular Tomorrow-1000  . QUEtiapine  800 mg Oral QHS  . sodium chloride flush  3 mL Intravenous Q12H  . sodium chloride flush  3 mL Intravenous Q12H  . sodium chloride flush  3 mL Intravenous Q12H   Continuous Infusions: . sodium chloride    . sodium chloride    . 0.9 % NaCl with KCl 20 mEq / L 75 mL/hr at 08/23/20 2142   PRN Meds: sodium chloride, sodium chloride, acetaminophen, HYDROmorphone (DILAUDID) injection, nitroGLYCERIN, ondansetron (ZOFRAN) IV, sodium chloride flush, sodium chloride flush   Vital Signs    Vitals:   08/24/20 0059 08/24/20 0103 08/24/20 0406 08/24/20 0407  BP:  133/62 116/76 116/76  Pulse:  89 85 85  Resp:  19 17 17   Temp:  98.6 F (37 C) 99.2 F (37.3 C) 99.2 F (37.3 C)  TempSrc:  Oral Oral Oral  SpO2:  100% 97% 94%  Weight: 72.4 kg     Height:        Intake/Output Summary (Last 24 hours) at 08/24/2020 0804 Last data filed at 08/24/2020 0400 Gross per 24 hour  Intake 984.25 ml  Output 800 ml  Net 184.25 ml   Last 3 Weights 08/24/2020 08/23/2020 08/22/2020  Weight (lbs) 159 lb 11.2 oz 162 lb 11.2 oz 163 lb 14.4 oz  Weight (kg) 72.439 kg 73.8 kg 74.345 kg      Telemetry    NSR with HR 80-100 - Personally Reviewed  ECG    No new ECG - Personally Reviewed  Physical Exam   GEN: No acute distress.    Neck: No JVD Cardiac: RRR, no murmurs, rubs, or gallops.  Respiratory: Clear to auscultation bilaterally. GI: Soft, TTP in epigastric region MS: No edema; No deformity. Neuro:  Nonfocal  Psych: Normal affect   Labs    High Sensitivity Troponin:   Recent Labs  Lab 08/20/20 1027 08/20/20 1332 08/20/20 1503  TROPONINIHS 1,534* 1,529* 1,415*      Chemistry Recent Labs  Lab 08/20/20 1040 08/20/20 1223 08/21/20 0558 08/22/20 0016 08/23/20 0558  NA   < >  --  136 137 137  K   < >  --  3.6 3.3* 3.8  CL   < >  --  106 106 106  CO2  --   --  24 22 22   GLUCOSE   < >  --  103* 68* 173*  BUN   < >  --  9 6 5*  CREATININE   < >  --  0.71 0.66 0.77  CALCIUM  --   --  8.6* 8.6* 9.0  PROT  --  6.1*  --   --  5.8*  ALBUMIN  --  3.4*  --   --  2.8*  AST  --  23  --   --  13*  ALT  --  23  --   --  15  ALKPHOS  --  72  --   --  60  BILITOT  --  0.9  --   --  0.8  GFRNONAA  --   --  >60 >60 >60  ANIONGAP  --   --  6 9 9    < > = values in this interval not displayed.     Hematology Recent Labs  Lab 08/21/20 0558 08/22/20 0016 08/23/20 0558  WBC 9.2 6.3 5.1  RBC 4.02 3.53* 3.89  HGB 12.2 10.7* 11.9*  HCT 35.7* 31.5* 35.2*  MCV 88.8 89.2 90.5  MCH 30.3 30.3 30.6  MCHC 34.2 34.0 33.8  RDW 13.9 14.0 14.0  PLT 327 263 273    BNPNo results for input(s): BNP, PROBNP in the last 168 hours.   DDimer No results for input(s): DDIMER in the last 168 hours.   Radiology    CARDIAC CATHETERIZATION  Result Date: 08/22/2020  Prox RCA lesion is 30% stenosed.  Mid RCA lesion is 40% stenosed.  Mid RCA to Dist RCA lesion is 20% stenosed.  Ost LAD to Prox LAD lesion is 20% stenosed.  1. Mild non-obstructive CAD 2. Elevated left filling pressures. Recommendations: Medical management of mild CAD    Cardiac Studies   Coronary angiogram 08/22/20:  Prox RCA lesion is 30% stenosed.  Mid RCA lesion is 40% stenosed.  Mid RCA to Dist RCA lesion is 20% stenosed.  Ost LAD to Prox  LAD lesion is 20% stenosed.  1. Mild non-obstructive CAD 2. Elevated left filling pressures.   Recommendations: Medical management of mild CAD  TTE 08/21/20: FINDINGS  Left Ventricle: Left ventricular ejection fraction, by estimation, is 60  to 65%. The left ventricle has normal function. The left ventricle has no  regional wall motion abnormalities. The left ventricular internal cavity  size was normal in size. There is  no left ventricular hypertrophy. Left ventricular diastolic parameters  were normal.   Right Ventricle: The right ventricular size is normal. No increase in  right ventricular wall thickness. Right ventricular systolic function is  normal. Tricuspid regurgitation signal is inadequate for assessing PA  pressure.   Left Atrium: Left atrial size was normal in size.   Right Atrium: Right atrial size was normal in size.   Pericardium: There is no evidence of pericardial effusion.   Mitral Valve: The mitral valve is normal in structure. Trivial mitral  valve regurgitation. No evidence of mitral valve stenosis.   Tricuspid Valve: The tricuspid valve is normal in structure. Tricuspid  valve regurgitation is not demonstrated.   Aortic Valve: The aortic valve is tricuspid. Aortic valve regurgitation is  not visualized. No aortic stenosis is present.   Pulmonic Valve: The pulmonic valve was normal in structure. Pulmonic valve  regurgitation is not visualized.   Aorta: The aortic root is normal in size and structure.   Venous: The inferior vena cava is normal in size with greater than 50%  respiratory variability, suggesting right atrial pressure of 3 mmHg.   IAS/Shunts: No atrial level shunt detected by color flow Doppler.   CTA chest/abdomen/pelvis 08/20/20: NON-VASCULAR  Hepatobiliary: Diffusely decreased attenuation of the hepatic parenchyma. No focal hepatic lesion is identified on arterial phase imaging. Status post cholecystectomy. No biliary  dilatation.  Pancreas: There is peripancreatic fat stranding around the pancreatic head and proximal body (series 7, image 93). No pancreatic ductal dilatation.  No evidence of parenchymal non enhancement. No peripancreatic fluid collection.  Spleen: 1.8 x 0.9 cm rounded area of hypoattenuation within the anterior aspect of the spleen (series 7, image 84; series 12, image 160) measuring greater than fluid density. Spleen is otherwise within normal limits. There are 2 small splenules in the region of the splenic hilum.  Adrenals/Urinary Tract: No adrenal nodules or masses. There are wedge-shaped areas of decreased enhancement within the inferior pole of the right kidney (series 10, images 117 and 121). Possible tiny area of hypoattenuating cortex in the inferior pole of the left kidney (series 10, image 119). Kidneys have otherwise symmetric enhancement. No enhancing renal mass, stone, or hydronephrosis. Urinary bladder unremarkable.  Stomach/Bowel: Small hiatal hernia. Stomach is otherwise within normal limits. Appendix appears normal (series 7, image 159). No evidence of bowel wall thickening, distention, or inflammatory changes. Moderate volume stool within the colon.  Lymphatic: No abdominopelvic lymphadenopathy.  Reproductive: Uterus and bilateral adnexa are unremarkable.  Other: No free fluid. No abdominopelvic fluid collection. No pneumoperitoneum. No abdominal wall hernia.  Musculoskeletal: No acute or significant osseous findings.  Review of the MIP images confirms the above findings.  IMPRESSION: Vascular findings:  1. Negative for aortic aneurysm or dissection. 2. Small wedge-shaped areas of decreased enhancement within the inferior poles of the right kidney and left kidney, concerning for areas of focal renal infarction. Pyelonephritis could have a similar appearance. Correlation with urinalysis is recommended. 3. Focal 11 x 5 mm area of noncalcified  atherosclerotic plaque with a slightly polypoid configuration projecting into the lumen of the distal descending thoracic aorta. This may be at risk for potential plaque thrombosis.  Nonvascular findings:  1. Findings of acute uncomplicated pancreatitis. No evidence of pancreatic necrosis or peripancreatic fluid collection. Correlate with serum lipase. 2. Findings suggestive of hepatic steatosis. 3. Indeterminate 1.8 cm rounded area of hypoattenuation within the anterior aspect of the spleen. Further evaluation with contrast-enhanced MRI can be performed on a nonemergent basis. 4. Slightly mottled appearance of the left third rib anteriorly with some adjacent scarring in the adjacent lung, potentially representing post radiation changes. 5. Left breast skin thickening with suspected lumpectomy site at the inner left breast. Continued mammographic follow-up is recommended.  Patient Profile     54 y.o. 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 Now on medical management.  Assessment & Plan    #Nonobstructive CAD: #NSTEMI: Patient presented with epigastric pain that woke her from sleep with concern for UA. Attempted CTA, however, patient was unable to tolerate as became hypotensive with medication administered to control her HR. Ca score was obtained and returned 35 (LAD and LM) which was 96% for age-matched gender controls. Given elevated Ca score, symptoms and risk factors, decision was made to proceed with cath which demonstrated non-obstructive CAD. Given reassuring cardiac work-up (cath with non-obstructive CAD; TTE with normal EF, no WMA, no LVH, normal valves; tele without significant ectopy, CTA c/a/p  without PE/aortic pathology/vasculitis) suspect elevated troponin was secondary to acute pancreatitis. No further cardiac work-up needed  -Continue ASA 81mg  daily -Continue atorvastatin 40mg  daily -Continue metop succinate 25mg  XL -Start low dose losartan 25mg  daily -No further work-up of elevated troponin needed as in-patient at  this time; will follow-up with cardiology as out-patient for further management  #Epigastric Pain #Possible pancreatitis: With increased abdominal pain yesterday. Now NPO on IVF per primary team. -Management per primary team  #HLD: LDL 61, HDL 29, TG 227. -Continue atorvastatin 40mg  daily    Cardiology will sign-off. Please feel free to call with any questions or concerns. We will arrange for out-patient follow-up with Dr. Debara Pickett.  For questions or updates, please contact [place] Please consult [email or web address] for contact info under        Signed, Freada Bergeron, MD  08/24/2020, 8:04 AM

## 2020-08-24 NOTE — Progress Notes (Signed)
PROGRESS NOTE    Ariana Herrera   NKN:[id number]  DOB: [birth year]  DOA: 08/20/2020 PCP: Dulce Sellar, MD   Brief Narrative:  Ariana Herrera a 54 y.o.femalewith medical history significant ofCAD andbreastcancertreatedwith chemo/rads and with residual lymphedema presenting with abdominal pain.She reports she was awakened overnight with severe abdominal pain. It got worse and worse for about 2 hours and her boyfriend called 911. +n/v. She felt ok yesterday with mild cramping. She is still having severe substernal pain that radiates into her back. Nothing makes it worse, better with pain meds but it is short-lived. No h/o similar prior. She is very nauseated. She did have an MI in the past (maybe 4 years ago) - presented with chest discomfort, told it might be GERD and they discharged her. About 1-2 days later she went back and they said a stent was not needed but it was a heart attack.   Of note, she was seen at Urgent Care 2 days ago with R sided shoulder pain and chest pain. She took NTG x 2 for this due to concern that it was a heart attack. She reports being told that it was a shoulder strain for which she was given prednisone. She took the 60 mg prednisone yesterday for the first time and symptoms developed overnight. Her only other pain medication was Norco, which she takes regularly for pain. Denies NSAIDs use.    Subjective: Abdominal pain improved. States she is very constipated.    Assessment & Plan:   Principal Problem:   NSTEMI (non-ST elevated myocardial infarction)   - vs related to acute pancreatitis  Ref. Range 08/20/2020 10:27 08/20/2020 13:32 08/20/2020 15:03  Troponin I (High Sensitivity) Latest Ref Range: <18 ng/L 1,534 (HH) 1,529 (HH) 1,415 [facility])  Cardiac Cath 10/29:  Prox RCA lesion is 30% stenosed.  Mid RCA lesion is 40% stenosed.  Mid RCA to Dist RCA lesion is 20% stenosed.  Ost LAD to Prox LAD lesion is 20%  stenosed.   1. Mild non-obstructive CAD 2. Elevated left filling pressures.  - ECHO noted below is unremarkable - no symptoms or signs of vasculitis/ sepsis/ shock/ rhabdomyolysis/ CVA - no history of Cocaine abuse  - no further work up at this time per Cards- will f/u as outpt  Active Problems:     Pancreatitis, acute - presenting with abdominal pain- CT correlated with acute pancreatitis and fatty liver (No h/o ETOH abuse) - having ongoing pain after being started on diet and asking for pain medications - downgraded to NPO- pain improved- will place on clears - advised to have fat free clears only- place on maintenance fluids  Constipation - Dulcolax suppository and Miralax ordered - Fleet enema if no BM today  Hypomagnesemia - replaced-  Labs not drawn today.  Fatty liver  - discussed findings and discussed need for weigh loss with patient  Dyslipidemia- cont Tricor - also related to uncontrolled DM Total CHOL/HDL Ratio    4.7           Total CHOL/HDL Ratio     Cholesterol    135           Cholesterol    HDL Cholesterol    29           HDL Cholesterol    LDL (calc)    61           LDL (calc)    Triglycerides    227  Triglycerides    VLDL    45           VLDL    CBC     Splenic lesion - see CTA report below - non urgent outpatient MRI can be pursued     Diabetes mellitus type 2 in obese (HCC)   - Uncontrolled   - A1c 10.8 - on Metformin at home- will need hold stop Trulicity - cont Lantus- following glucose -  she may benefit from short acting insulin with meals as outpatient     Breast cancer ([place])  - cont outpt follow up  Time spent in minutes: 35 DVT prophylaxis: SCD's Start: 08/22/20 1322  Code Status: full code Family Communication:  Disposition Plan:  Status is: Inpatient  Remains inpatient appropriate because:acute pancreatitis   Dispo: The patient is from: Home              Anticipated d/c is to: Home              Anticipated d/c  date is: > 3 days              Patient currently is not medically stable to d/c.      Consultants:   cardiology Procedures:  10/28- 2 D ECHO 1. Left ventricular ejection fraction, by estimation, is 60 to 65%. The  left ventricle has normal function. The left ventricle has no regional  wall motion abnormalities. Left ventricular diastolic parameters were  normal.  2. Right ventricular systolic function is normal. The right ventricular  size is normal. Tricuspid regurgitation signal is inadequate for assessing  PA pressure.  3. The mitral valve is normal in structure. Trivial mitral valve  regurgitation. No evidence of mitral stenosis.  4. The aortic valve is tricuspid. Aortic valve regurgitation is not  visualized. No aortic stenosis is present.  5. The inferior vena cava is normal in size with greater than 50%  respiratory variability, suggesting right atrial pressure of 3 mmHg.  Antimicrobials:  Anti-infectives (From admission, onward)   None       Objective: Vitals:   08/24/20 0406 08/24/20 0407 08/24/20 0838 08/24/20 1207  BP: 116/76 116/76 (!) 143/76 131/74  Pulse: 85 85 90 91  Resp: 17 17 18 18   Temp: 99.2 F (37.3 C) 99.2 F (37.3 C) 98.7 F (37.1 C) 97.7 F (36.5 C)  TempSrc: Oral Oral Oral Oral  SpO2: 97% 94% 98% 94%  Weight:      Height:        Intake/Output Summary (Last 24 hours) at 08/24/2020 1411 Last data filed at 08/24/2020 1100 Gross per 24 hour  Intake 1227.25 ml  Output --  Net 1227.25 ml   Filed Weights   08/22/20 0228 08/23/20 0102 08/24/20 0059  Weight: 74.3 kg 73.8 kg 72.4 kg    Examination: General exam: Appears comfortable  HEENT: PERRLA, oral mucosa moist, no sclera icterus or thrush Respiratory system: Clear to auscultation. Respiratory effort normal. Cardiovascular system: S1 & S2 heard,  No murmurs  Gastrointestinal system: Abdomen soft, epigastric tenderness today, nondistended. Normal bowel sounds   Central nervous  system: Alert and oriented. No focal neurological deficits. Extremities: No cyanosis, clubbing or edema Skin: No rashes or ulcers Psychiatry:  Mood & affect appropriate.     Data Reviewed: I have personally reviewed following labs and imaging studies  CBC: Recent Labs  Lab 08/20/20 1027 08/20/20 1040 08/21/20 0558 08/22/20 0016 08/23/20 0558  WBC 12.8*  --  9.2 6.3 5.1  HGB 13.2 13.6 12.2 10.7* 11.9*  HCT 38.3 40.0 35.7* 31.5* 35.2*  MCV 89.3  --  88.8 89.2 90.5  PLT 408*  --  327 263 767   Basic Metabolic Panel: Recent Labs  Lab 08/20/20 1040 08/20/20 1503 08/20/20 1547 08/21/20 0558 08/22/20 0016 08/23/20 0558  NA 135  --   --  136 137 137  K 3.9  --   --  3.6 3.3* 3.8  CL 101  --   --  106 106 106  CO2  --   --   --  24 22 22   GLUCOSE 334*  --   --  103* 68* 173*  BUN 14  --   --  9 6 5*  CREATININE 0.60  --   --  0.71 0.66 0.77  CALCIUM  --   --   --  8.6* 8.6* 9.0  MG  --  1.5* 1.3* 1.7 1.5* 1.5*   GFR: Estimated Creatinine Clearance: 77.5 mL/min (by C-G formula based on SCr of 0.77 mg/dL). Liver Function Tests: Recent Labs  Lab 08/20/20 1223 08/23/20 0558  AST 23 13*  ALT 23 15  ALKPHOS 72 60  BILITOT 0.9 0.8  PROT 6.1* 5.8*  ALBUMIN 3.4* 2.8*   Recent Labs  Lab 08/20/20 1223 08/22/20 0016 08/23/20 0558  LIPASE 148* 57* 48   No results for input(s): AMMONIA in the last 168 hours. Coagulation Profile: Recent Labs  Lab 08/20/20 1027 08/21/20 0558  INR 1.0 1.1   Cardiac Enzymes: No results for input(s): CKTOTAL, CKMB, CKMBINDEX, TROPONINI in the last 168 hours. BNP (last 3 results) No results for input(s): PROBNP in the last 8760 hours. HbA1C: No results for input(s): HGBA1C in the last 72 hours. CBG: Recent Labs  Lab 08/23/20 1244 08/23/20 1757 08/24/20 0107 08/24/20 0644 08/24/20 1204  GLUCAP 218* 170* 119* 118* 220*   Lipid Profile: No results for input(s): CHOL, HDL, LDLCALC, TRIG, CHOLHDL, LDLDIRECT in the last 72  hours. Thyroid Function Tests: No results for input(s): TSH, T4TOTAL, FREET4, T3FREE, THYROIDAB in the last 72 hours. Anemia Panel: No results for input(s): VITAMINB12, FOLATE, FERRITIN, TIBC, IRON, RETICCTPCT in the last 72 hours. Urine analysis: No results found for: COLORURINE, APPEARANCEUR, LABSPEC, PHURINE, GLUCOSEU, HGBUR, BILIRUBINUR, KETONESUR, PROTEINUR, UROBILINOGEN, NITRITE, LEUKOCYTESUR Sepsis Labs: @LABRCNTIP (procalcitonin:4,lacticidven:4) ) Recent Results (from the past 240 hour(s))  Respiratory Panel by RT PCR (Flu A&B, Covid) - Nasopharyngeal Swab     Status: None   Collection Time: 08/20/20 11:47 AM   Specimen: Nasopharyngeal Swab  Result Value Ref Range Status   SARS Coronavirus 2 by RT PCR NEGATIVE NEGATIVE Final    Comment: (NOTE) SARS-CoV-2 target nucleic acids are NOT DETECTED.  The SARS-CoV-2 RNA is generally detectable in upper respiratoy specimens during the acute phase of infection. The lowest concentration of SARS-CoV-2 viral copies this assay can detect is 131 copies/mL. A negative result does not preclude SARS-Cov-2 infection and should not be used as the sole basis for treatment or other patient management decisions. A negative result may occur with  improper specimen collection/handling, submission of specimen other than nasopharyngeal swab, presence of viral mutation(s) within the areas targeted by this assay, and inadequate number of viral copies (<131 copies/mL). A negative result must be combined with clinical observations, patient history, and epidemiological information. The expected result is Negative.  Fact Sheet for Patients:  PinkCheek.be  Fact Sheet for Healthcare Providers:  GravelBags.it  This test is no t yet approved or cleared by the  [place] [employer]ural [employer] and  has been authorized for detection and/or diagnosis of SARS-CoV-2 by FDA under an Print production planner (EUA). This  EUA will remain  in effect (meaning this test can be used) for the duration of the COVID-19 declaration under Section 564(b)(1) of the Act, 21 U.S.C. section 360bbb-3(b)(1), unless the authorization is terminated or revoked sooner.     Influenza A by PCR NEGATIVE NEGATIVE Final   Influenza B by PCR NEGATIVE NEGATIVE Final    Comment: (NOTE) The Xpert Xpress SARS-CoV-2/FLU/RSV assay is intended as an aid in  the diagnosis of influenza from Nasopharyngeal swab specimens and  should not be used as a sole basis for treatment. Nasal washings and  aspirates are unacceptable for Xpert Xpress SARS-CoV-2/FLU/RSV  testing.  Fact Sheet for Patients: PinkCheek.be  Fact Sheet for Healthcare Providers: GravelBags.it  This test is not yet approved or cleared by the [place] FDA and  has been authorized for detection and/or diagnosis of SARS-CoV-2 by  FDA under an Emergency Use Authorization (EUA). This EUA will remain  in effect (meaning this test can be used) for the duration of the  Covid-19 declaration under Section 564(b)(1) of the Act, 21  U.S.C. section 360bbb-3(b)(1), unless the authorization is  terminated or revoked. Performed at [facility], [place] [address], [place], [place] [address]          Radiology Studies: No results found.    Scheduled Meds: . aspirin EC  81 mg Oral Daily  . atorvastatin  40 mg Oral Daily  . insulin aspart  0-15 Units Subcutaneous TID WC  . insulin glargine  6 Units Subcutaneous QHS  . losartan  25 mg Oral Daily  . metoprolol succinate  25 mg Oral Daily  . nicotine  21 mg Transdermal Daily  . pantoprazole (PROTONIX) IV  40 mg Intravenous Q12H  . pneumococcal 23 valent vaccine  0.5 mL Intramuscular Tomorrow-1000  . QUEtiapine  800 mg Oral QHS  . sodium chloride flush  3 mL Intravenous Q12H  . sodium chloride flush  3 mL Intravenous Q12H  . sodium chloride flush  3 mL  Intravenous Q12H   Continuous Infusions: . sodium chloride    . sodium chloride    . 0.9 % NaCl with KCl 20 mEq / L 75 mL/hr at 08/24/20 1028     LOS: 4 days      Debbe Odea, MD [facility] Pager: [email or web address] 08/24/2020, 2:11 PM

## 2020-08-25 ENCOUNTER — Telehealth: Payer: Self-pay | Admitting: Internal Medicine

## 2020-08-25 ENCOUNTER — Encounter (HOSPITAL_COMMUNITY): Payer: Self-pay | Admitting: Internal Medicine

## 2020-08-25 DIAGNOSIS — C50212 Malignant neoplasm of upper-inner quadrant of left female breast: Secondary | ICD-10-CM

## 2020-08-25 DIAGNOSIS — Z171 Estrogen receptor negative status [ER-]: Secondary | ICD-10-CM

## 2020-08-25 DIAGNOSIS — E669 Obesity, unspecified: Secondary | ICD-10-CM

## 2020-08-25 DIAGNOSIS — E1169 Type 2 diabetes mellitus with other specified complication: Secondary | ICD-10-CM

## 2020-08-25 LAB — URINALYSIS, ROUTINE W REFLEX MICROSCOPIC
Bilirubin Urine: NEGATIVE
Glucose, UA: 50 mg/dL — AB
Hgb urine dipstick: NEGATIVE
Ketones, ur: NEGATIVE mg/dL
Leukocytes,Ua: NEGATIVE
Nitrite: NEGATIVE
Protein, ur: NEGATIVE mg/dL
Specific Gravity, Urine: 1.004 — ABNORMAL LOW (ref 1.005–1.030)
pH: 6 (ref 5.0–8.0)

## 2020-08-25 LAB — GLUCOSE, CAPILLARY
Glucose-Capillary: 190 mg/dL — ABNORMAL HIGH (ref 70–99)
Glucose-Capillary: 221 mg/dL — ABNORMAL HIGH (ref 70–99)
Glucose-Capillary: 187 mg/dL — ABNORMAL HIGH (ref 70–99)

## 2020-08-25 LAB — BASIC METABOLIC PANEL
Anion gap: 6 (ref 5–15)
BUN: <5 mg/dL — ABNORMAL LOW (ref 6–20)
CO2: 22 mmol/L (ref 22–32)
Calcium: 8.5 mg/dL — ABNORMAL LOW (ref 8.9–10.3)
Chloride: 109 mmol/L (ref 98–111)
Creatinine, Ser: 0.58 mg/dL (ref 0.44–1.00)
GFR, Estimated: >60 mL/min (ref >60)
Glucose, Bld: 209 mg/dL — ABNORMAL HIGH (ref 70–99)
Potassium: 5.4 mmol/L — ABNORMAL HIGH (ref 3.5–5.1)
Sodium: 137 mmol/L (ref 135–145)

## 2020-08-25 LAB — RAPID URINE DRUG SCREEN, HOSP PERFORMED
Amphetamines: NOT DETECTED
Barbiturates: NOT DETECTED
Benzodiazepines: NOT DETECTED
Cocaine: NOT DETECTED
Opiates: POSITIVE — AB
Tetrahydrocannabinol: NOT DETECTED

## 2020-08-25 LAB — MAGNESIUM: Magnesium: 1.2 mg/dL — ABNORMAL LOW (ref 1.7–2.4)

## 2020-08-25 MED ORDER — ATORVASTATIN CALCIUM 40 MG PO TABS
40.0000 mg | ORAL_TABLET | Freq: Every day | ORAL | 0 refills | Status: DC
Start: 2020-08-26 — End: 2020-12-05

## 2020-08-25 MED ORDER — HYDROCODONE-ACETAMINOPHEN 5-325 MG PO TABS
1.0000 | ORAL_TABLET | Freq: Three times a day (TID) | ORAL | Status: DC
  Administered 2020-08-25: 1 via ORAL
  Filled 2020-08-25: qty 1

## 2020-08-25 MED ORDER — NICOTINE 21 MG/24HR TD PT24: 21.0000 mg | MEDICATED_PATCH | Freq: Every day | TRANSDERMAL | 0 refills | Status: DC

## 2020-08-25 MED ORDER — KETOROLAC TROMETHAMINE 30 MG/ML IJ SOLN
30.0000 mg | Freq: Once | INTRAMUSCULAR | Status: AC
  Administered 2020-08-25: 30 mg via INTRAVENOUS
  Filled 2020-08-25: qty 1

## 2020-08-25 MED ORDER — METOPROLOL SUCCINATE ER 25 MG PO TB24
25.0000 mg | ORAL_TABLET | Freq: Every day | ORAL | 0 refills | Status: DC
Start: 2020-08-26 — End: 2021-07-06

## 2020-08-25 MED ORDER — NOVOLOG FLEXPEN 100 UNIT/ML ~~LOC~~ SOPN
3.0000 [IU] | PEN_INJECTOR | Freq: Three times a day (TID) | SUBCUTANEOUS | 0 refills | Status: DC
Start: 2020-08-25 — End: 2024-03-01

## 2020-08-25 MED ORDER — LOSARTAN POTASSIUM 25 MG PO TABS
25.0000 mg | ORAL_TABLET | Freq: Every day | ORAL | 0 refills | Status: DC
Start: 2020-08-26 — End: 2023-01-24

## 2020-08-25 NOTE — Discharge Instructions (Signed)
Remember to continue to take a low fat diet for at least 1 wk.  If you find the Nicotine patches useful, please request your PCP to prescribe more.   You were cared for by a hospitalist during your hospital stay. If you have any questions about your discharge medications or the care you received while you were in the hospital after you are discharged, you can call the unit and asked to speak with the hospitalist on call if the hospitalist that took care of you is not available. Once you are discharged, your primary care physician will handle any further medical issues.   Please note that NO REFILLS for any discharge medications will be authorized once you are discharged, as it is imperative that you return to your primary care physician (or establish a relationship with a primary care physician if you do not have one) for your aftercare needs so that they can reassess your need for medications and monitor your lab values.  Please take all your medications with you for your next visit with your Primary MD. Please ask your Primary MD to get all Hospital records sent to his/her office. Please request your Primary MD to go over all hospital test results at the follow up.   If you experience worsening of your admission symptoms, develop shortness of breath, chest pain, suicidal or homicidal thoughts or a life threatening emergency, you must seek medical attention immediately by calling 911 or calling your MD.   Dennis Bast must read the complete instructions/literature along with all the possible adverse reactions/side effects for all the medicines you take including new medications that have been prescribed to you. Take new medicines after you have completely understood and accpet all the possible adverse reactions/side effects.    Do not drive when taking pain medications or sedatives.     Do not take more than prescribed Pain, Sleep and Anxiety Medications   If you have smoked or chewed Tobacco in the last  2 yrs please stop. Stop any regular alcohol  and or recreational drug use.   Wear Seat belts while driving.

## 2020-08-25 NOTE — Telephone Encounter (Signed)
°  TOC appt on 09/04/20 @ 9:15 am with Coletta Memos per staff message from [place], [place]

## 2020-08-25 NOTE — Progress Notes (Signed)
Inpatient Diabetes Program Recommendations  AACE/ADA: New Consensus Statement on Inpatient Glycemic Control (2015)  Target Ranges:  Prepandial:   less than 140 mg/dL      Peak postprandial:   less than 180 mg/dL (1-2 hours)      Critically ill patients:  140 - 180 mg/dL   Lab Results  Component Value Date   GLUCAP 190 (H) 08/25/2020   HGBA1C 10.8 (H) 08/20/2020    Review of Glycemic Control Results for ICEIS, KNAB (MRN [id number]) as of 08/25/2020 11:10  Ref. Range 08/24/2020 06:44 08/24/2020 12:04 08/24/2020 17:20 08/24/2020 21:14 08/25/2020 06:15  Glucose-Capillary Latest Ref Range: 70 - 99 mg/dL 118 (H) 220 (H) 167 (H) 162 (H) 190 (H)   Diabetes history:Type 2 DM Outpatient Diabetes medications:Trulicity0.75 mgQwk, Lantus 25 units QD, Metformin 1000 mg BID Current orders for Inpatient glycemic control:Lantus 6 units QHS, Novolog 0-15 units TID  Note: Referral for inpatient diabetes education on possible discharge on rapid acting insulin.  Spoke with patient at bedside.  Reviewed patient's current A1c of 10.8%. Explained what a A1c is and what it measures. Also reviewed goal A1c with patient, importance of good glucose control @ home, and blood sugar goals. Explained she may be discharged on rapid acting insulin.  She states she has taken it in the past.  Reviewed how rapid acting insulin works and importance of checking her blood sugars ac/hs.  She is aware of hypoglycemia, signs, symptoms and treatments.   She is current with her PCP and denies difficulties obtaining medications or supplies.  She should notify her PCP for blood sugars consistently < 100 mg/dL and > 200 mg/dL.  Will continue to follow while inpatient.  Thank you, Reche Dixon, RN, BSN Diabetes Coordinator Inpatient Diabetes Program [phone] (team pager from 8a-5p)

## 2020-08-25 NOTE — Telephone Encounter (Signed)
Currently admitted. TOC call for 11/2

## 2020-08-26 NOTE — Telephone Encounter (Signed)
[id number] 

## 2020-08-26 NOTE — Discharge Summary (Signed)
Physician Discharge Summary  Ariana Herrera KWI:[id number] DOB: 1966-10-02 DOA: 08/20/2020  PCP: Dulce Sellar, MD  Admit date: 08/20/2020 Discharge date: 08/26/2020  Admitted From: home  Disposition:  home   Recommendations for Outpatient Follow-up:  1. F/u on Troponin 2. Holding trulicity 3. Note new finding of splenic lesion- Radiology recommending f/u imaging- see below  Home Health:  none  Discharge Condition:  stable   CODE STATUS:  Full code   Diet recommendation:  Heart healthy/ low fat/ diabetic Consultations:  cardiology  Procedures/Studies: . Cardiac cath   Discharge Diagnoses:  Principal Problem:   NSTEMI (non-ST elevated myocardial infarction) ([place]) Active Problems:   Pancreatitis, acute   Bipolar disease, chronic ([place])   Diabetes mellitus type 2 in obese ([place])   Class 1 obesity due to excess calories with body mass index (BMI) of 31.0 to 31.9 in adult   Chest pain of uncertain etiology    Breast cancer ([place])     Brief Summary: Ariana Herrera a 54 y.o.femalewith medical history significant ofCAD andbreastcancertreatedwith chemo/rads and with residual lymphedema presenting with abdominal pain.She reports she was awakened overnight with severe abdominal pain. It got worse and worse for about 2 hours and her boyfriend called 911. +n/v. She felt ok yesterday with mild cramping. She is still having severe substernal pain that radiates into her back. Nothing makes it worse, better with pain meds but it is short-lived. No h/o similar prior. She is very nauseated. She did have an MI in the past (maybe 4 years ago) - presented with chest discomfort, told it might be GERD and they discharged her. About 1-2 days later she went back and they said a stent was not needed but it was a heart attack.   Of note, she was seen at Urgent Care 2 days ago with R sided shoulder pain and chest pain. She took NTG x 2 for this due to concern that it  was a heart attack. She reports being told that it was a shoulder strain for which she was given prednisone. She took the 60 mg prednisone yesterday for the first time and symptoms developed overnight. Her only other pain medication was Norco, which she takes regularly for pain. Denies NSAIDs use.  Hospital Course:  NSTEMI (non-ST elevated myocardial infarction)   - vs related to acute pancreatitis  Ref. Range 08/20/2020 10:27 08/20/2020 13:32 08/20/2020 15:03  Troponin I (High Sensitivity) Latest Ref Range: <18 ng/L 1,534 (HH) 1,529 (HH) 1,415 [facility])  Cardiac Cath 10/29:  Prox RCA lesion is 30% stenosed.  Mid RCA lesion is 40% stenosed.  Mid RCA to Dist RCA lesion is 20% stenosed.  Ost LAD to Prox LAD lesion is 20% stenosed.  1. Mild non-obstructive CAD 2. Elevated left filling pressures.  - ECHO noted below is unremarkable - no symptoms or signs of vasculitis/ sepsis/ shock/ rhabdomyolysis/ CVA - no history of Cocaine abuse  - Troponin can be elevated in setting of Acute pancreatitis (rarely) - no further work up at this time per Cards- will f/u as outpt  Active Problems:     Pancreatitis, acute - presenting with abdominal pain- CT correlated with acute pancreatitis and fatty liver (No h/o ETOH abuse) - possibly secondary to Trulicity which we are now holding - having ongoing pain after being started on diet and asking for pain medications - downgraded to NPO- pain improved-  - today has been advanced to solids- pain is constant and likely soreness from vomiting- not being exacerbated  after taking solids for lunch and dinner - she wants to d/c home today- can use heating pad for soreness and Ibuprofen for a few days.      Diabetes mellitus type 2 in obese ([facility])   - Uncontrolled   - A1c 10.8                - will need hold Trulicity - on Metformin at home- due to above pancreatitis - cont Lantus-   - I have discussed and started Novolog with meals which she would  like to take at home- patient is comfortable with using it- titrate dose as outpt  Constipation, chronic issue - Resolved after Fleet enema   Hypomagnesemia - replaced-   Fatty liver  - discussed findings and discussed need for weigh loss with patient  Dyslipidemia- cont Tricor - also related to uncontrolled DM                     Total CHOL/HDL Ratio    4.7           Total CHOL/HDL Ratio     Cholesterol    135           Cholesterol    HDL Cholesterol    29           HDL Cholesterol    LDL (calc)    61           LDL (calc)    Triglycerides    227           Triglycerides    VLDL    45           VLDL    CBC      Splenic lesion - see CTA report below - non urgent outpatient MRI can be pursued     Breast cancer ([place])  - cont outpt follow up     Discharge Exam: Vitals:   08/25/20 0919 08/25/20 1109  BP: (!) 147/90 123/82  Pulse: 92 92  Resp: 18 20  Temp: 97.8 F (36.6 C) 98.4 F (36.9 C)  SpO2: 99% 97%   Vitals:   08/24/20 2038 08/25/20 0342 08/25/20 0919 08/25/20 1109  BP: 124/75 127/77 (!) 147/90 123/82  Pulse: 86 88 92 92  Resp: 20 16 18 20   Temp: 98.8 F (37.1 C) 98.7 F (37.1 C) 97.8 F (36.6 C) 98.4 F (36.9 C)  TempSrc: Oral Oral Oral Oral  SpO2: 95% 97% 99% 97%  Weight:  72.7 kg    Height:        General: Pt is alert, awake, not in acute distress Cardiovascular: RRR, S1/S2 +, no rubs, no gallops Respiratory: CTA bilaterally, no wheezing, no rhonchi Abdominal: Soft, NT, mild tenderness in upper abdomen, bowel sounds + Extremities: no edema, no cyanosis   Discharge Instructions  Discharge Instructions    Diet - low sodium heart healthy   Complete by: As directed    Low fat   Diet Carb Modified   Complete by: As directed    Increase activity slowly   Complete by: As directed      Allergies as of 08/25/2020      Reactions   Lithium Other  (See Comments)   Spinal fluid built up in brain   Penicillins Other (See Comments)   UNKNOWN CHILDHOOD REACTION      Medication List    STOP taking these medications   predniSONE 10 MG tablet Commonly known as: DELTASONE  Trulicity 0.53 ZJ/6.7HA Sopn Generic drug: Dulaglutide     TAKE these medications   atorvastatin 40 MG tablet Commonly known as: LIPITOR Take 1 tablet (40 mg total) by mouth daily.   fenofibrate 145 MG tablet Commonly known as: TRICOR Take 145 mg by mouth daily.   HYDROcodone-acetaminophen 5-325 MG tablet Commonly known as: NORCO/VICODIN Take 1 tablet by mouth in the morning, at noon, and at bedtime.   insulin glargine 100 UNIT/ML injection Commonly known as: LANTUS Inject 25 Units into the skin at bedtime.   Linzess 290 MCG Caps capsule Generic drug: linaclotide Take 290 mcg by mouth daily as needed.   losartan 25 MG tablet Commonly known as: COZAAR Take 1 tablet (25 mg total) by mouth daily.   metFORMIN 1000 MG tablet Commonly known as: GLUCOPHAGE Take 1,000 mg by mouth 2 (two) times daily with a meal.   metoprolol succinate 25 MG 24 hr tablet Commonly known as: TOPROL-XL Take 1 tablet (25 mg total) by mouth daily.   nicotine 21 mg/24hr patch Commonly known as: NICODERM CQ - dosed in mg/24 hours Place 1 patch (21 mg total) onto the skin daily.   NovoLOG FlexPen 100 UNIT/ML FlexPen Generic drug: insulin aspart Inject 3 Units into the skin 3 (three) times daily with meals.   QUEtiapine 400 MG tablet Commonly known as: SEROQUEL Take 800 mg by mouth at bedtime.       Follow-up Information    Hilty, Nadean Corwin, MD Follow up.   Specialty: Cardiology Why: the office with call with appt date and time, if you have not heard by Tuesday call the office.  thanks Contact information: [place] [address] [phone]        Dulce Sellar, MD. Schedule an appointment as soon as possible for a visit in 1  week(s).   Specialty: General Practice Contact information: [address] [place] [address] [phone]              Allergies  Allergen Reactions  . Lithium Other (See Comments)    Spinal fluid built up in brain  . Penicillins Other (See Comments)    UNKNOWN CHILDHOOD REACTION      CARDIAC CATHETERIZATION  Result Date: 08/22/2020  Prox RCA lesion is 30% stenosed.  Mid RCA lesion is 40% stenosed.  Mid RCA to Dist RCA lesion is 20% stenosed.  Ost LAD to Prox LAD lesion is 20% stenosed.  1. Mild non-obstructive CAD 2. Elevated left filling pressures. Recommendations: Medical management of mild CAD   CT CARDIAC SCORING  Addendum Date: 08/21/2020   ADDENDUM REPORT: 08/21/2020 18:20 EXAM: OVER-READ INTERPRETATION  CT CHEST The following report is an over-read performed by radiologist Dr. Alvino Blood [facility] on 08/21/2020. This over-read does not include interpretation of cardiac or coronary anatomy or pathology. The calcium score interpretation by the cardiologist is attached. COMPARISON:  CT chest 08/20/2020 FINDINGS: Limited view of the lung parenchyma demonstrates no suspicious nodularity. Airways are normal. Limited view of the mediastinum demonstrates no adenopathy. Esophagus normal. Limited view of the upper abdomen unremarkable. Limited view of the skeleton and chest wall is unremarkable. IMPRESSION: No significant extracardiac findings. Electronically Signed   By: Suzy Bouchard M.D.   On: 08/21/2020 18:20   Result Date: 08/21/2020 CLINICAL DATA:  Risk stratification EXAM: Coronary Calcium Score TECHNIQUE: The patient was scanned on a [employer]. Axial non-contrast 3 mm slices were carried out through the heart. The data set was  analyzed on a dedicated work station and scored using the [employer]. FINDINGS: Non-cardiac: See separate report from [facility]. Ascending Aorta: Normal caliber.  Atherosclerosis.  Pericardium: Normal Coronary arteries: Normal origins.  Multivessel CAC. IMPRESSION: Coronary calcium score of 35. This was 94th percentile for age and sex matched control. Electronically Signed: By: Pixie Casino M.D. On: 08/21/2020 16:27   DG Chest Portable 1 View  Result Date: 08/20/2020 CLINICAL DATA:  Chest pain EXAM: PORTABLE CHEST 1 VIEW COMPARISON:  Same-day CT FINDINGS: The heart size and mediastinal contours are within normal limits. No focal airspace consolidation, pleural effusion, or pneumothorax. The visualized skeletal structures are unremarkable. IMPRESSION: No active disease. Electronically Signed   By: Davina Poke D.O.   On: 08/20/2020 14:55   ECHOCARDIOGRAM COMPLETE  Result Date: 08/21/2020    ECHOCARDIOGRAM REPORT   Patient Name:   Ariana Herrera Date of Exam: 08/21/2020 Medical Rec #:  [id number]     Height:       62.0 in Accession #:    [id number]    Weight:       160.2 lb Date of Birth:  1966-03-06    BSA:          1.740 m Patient Age:    18 years      BP:           125/70 mmHg Patient Gender: F             HR:           90 bpm. Exam Location:  Inpatient Procedure: 2D Echo, Color Doppler and Cardiac Doppler Indications:    NSTEMI  History:        Patient has no prior history of Echocardiogram examinations.                 Risk Factors:Diabetes.  Sonographer:    Raquel Sarna Senior RDCS Referring Phys: Isaiah Serge IMPRESSIONS  1. Left ventricular ejection fraction, by estimation, is 60 to 65%. The left ventricle has normal function. The left ventricle has no regional wall motion abnormalities. Left ventricular diastolic parameters were normal.  2. Right ventricular systolic function is normal. The right ventricular size is normal. Tricuspid regurgitation signal is inadequate for assessing PA pressure.  3. The mitral valve is normal in structure. Trivial mitral valve regurgitation. No evidence of mitral stenosis.  4. The aortic valve is tricuspid. Aortic valve regurgitation is not  visualized. No aortic stenosis is present.  5. The inferior vena cava is normal in size with greater than 50% respiratory variability, suggesting right atrial pressure of 3 mmHg. FINDINGS  Left Ventricle: Left ventricular ejection fraction, by estimation, is 60 to 65%. The left ventricle has normal function. The left ventricle has no regional wall motion abnormalities. The left ventricular internal cavity size was normal in size. There is  no left ventricular hypertrophy. Left ventricular diastolic parameters were normal. Right Ventricle: The right ventricular size is normal. No increase in right ventricular wall thickness. Right ventricular systolic function is normal. Tricuspid regurgitation signal is inadequate for assessing PA pressure. Left Atrium: Left atrial size was normal in size. Right Atrium: Right atrial size was normal in size. Pericardium: There is no evidence of pericardial effusion. Mitral Valve: The mitral valve is normal in structure. Trivial mitral valve regurgitation. No evidence of mitral valve stenosis. Tricuspid Valve: The tricuspid valve is normal in structure. Tricuspid valve regurgitation is not demonstrated. Aortic Valve: The aortic valve is tricuspid. Aortic valve regurgitation is not  visualized. No aortic stenosis is present. Pulmonic Valve: The pulmonic valve was normal in structure. Pulmonic valve regurgitation is not visualized. Aorta: The aortic root is normal in size and structure. Venous: The inferior vena cava is normal in size with greater than 50% respiratory variability, suggesting right atrial pressure of 3 mmHg. IAS/Shunts: No atrial level shunt detected by color flow Doppler.  LEFT VENTRICLE PLAX 2D LVIDd:         4.00 cm  Diastology LVIDs:         2.10 cm  LV e' medial:    8.16 cm/s LV PW:         0.60 cm  LV E/e' medial:  9.9 LV IVS:        0.90 cm  LV e' lateral:   9.03 cm/s LVOT diam:     2.00 cm  LV E/e' lateral: 9.0 LV SV:         62 LV SV Index:   36 LVOT Area:      3.14 cm  RIGHT VENTRICLE RV S prime:     9.57 cm/s TAPSE (M-mode): 1.7 cm LEFT ATRIUM             Index       RIGHT ATRIUM           Index LA diam:        2.50 cm 1.44 cm/m  RA Area:     11.90 cm LA Vol (A2C):   38.1 ml 21.90 ml/m RA Volume:   26.80 ml  15.41 ml/m LA Vol (A4C):   28.3 ml 16.27 ml/m LA Biplane Vol: 32.8 ml 18.85 ml/m  AORTIC VALVE LVOT Vmax:   102.00 cm/s LVOT Vmean:  75.900 cm/s LVOT VTI:    0.197 m  AORTA Ao Root diam: 3.00 cm Ao Asc diam:  3.20 cm MITRAL VALVE MV Area (PHT): 3.21 cm    SHUNTS MV Decel Time: 236 msec    Systemic VTI:  0.20 m MV E velocity: 81.00 cm/s  Systemic Diam: 2.00 cm MV A velocity: 68.60 cm/s MV E/A ratio:  1.18 Loralie Champagne MD Electronically signed by Loralie Champagne MD Signature Date/Time: 08/21/2020/2:50:07 PM    Final    CT Angio Chest/Abd/Pel for Dissection W and/or Wo Contrast  Result Date: 08/20/2020 CLINICAL DATA:  Severe epigastric pain, abdominal pain. History of left breast cancer treated with lumpectomy, chemotherapy, and radiation in September of 2019 EXAM: CT ANGIOGRAPHY CHEST, ABDOMEN AND PELVIS TECHNIQUE: Non-contrast CT of the chest was initially obtained. Multidetector CT imaging through the chest, abdomen and pelvis was performed using the standard protocol during bolus administration of intravenous contrast. Multiplanar reconstructed images and MIPs were obtained and reviewed to evaluate the vascular anatomy. CONTRAST:  168mL OMNIPAQUE IOHEXOL 350 MG/ML SOLN COMPARISON:  None. FINDINGS: CTA CHEST FINDINGS Cardiovascular: Initial noncontrast imaging of the chest demonstrates no evidence for aortic intramural hematoma. Arterial phase postcontrast imaging demonstrates no evidence of aortic aneurysm or dissection. Four vessel arch with the left vertebral artery arising directly from the arch, an anatomic variant. There is scattered atherosclerotic calcification of the aorta. There is an 11 x 5 mm area of noncalcified atherosclerotic plaque with a  slightly polypoid configuration projecting into the lumen of the distal descending thoracic aorta (series 12, image 93). Central pulmonary vasculature is within normal limits. No central filling defect. Normal heart size. No pericardial effusion. Mediastinum/Nodes: No axillary, mediastinal, or hilar lymphadenopathy. Thyroid and trachea within normal limits. Small sliding-type hiatal hernia. Esophagus  otherwise within normal limits. Lungs/Pleura: No focal airspace consolidation. No suspicious pulmonary nodules or masses. No pleural effusion or pneumothorax. Musculoskeletal: There is a somewhat mottled appearance of the left third rib anteriorly (series 7, image 34) with some scarring in the adjacent lung, potentially representing post radiation changes. Osseous structures of the bony thorax appear otherwise within normal limits without suspicious bony lesion. Skin thickening of the left breast with irregularly marginated density within the inner aspect of the breast abutting the pectoralis musculature measuring approximately 2.3 x 1.8 x 2.2 cm (series 7, image 44; series 12, image 116), which may represent patient's lumpectomy site. Review of the MIP images confirms the above findings. CTA ABDOMEN AND PELVIS FINDINGS VASCULAR Aorta: Normal caliber aorta without aneurysm, dissection, vasculitis or significant stenosis. Mild calcified and noncalcified atherosclerotic plaque. Celiac: Patent without evidence of aneurysm, dissection, vasculitis or significant stenosis. SMA: Patent without evidence of aneurysm, dissection, vasculitis or significant stenosis. Renals: Both renal arteries are patent without evidence of aneurysm, dissection, vasculitis, fibromuscular dysplasia or significant stenosis. IMA: Patent. Inflow: Patent without evidence of aneurysm, dissection, vasculitis or significant stenosis. Mild calcified and noncalcified atherosclerotic plaque. Veins: No obvious venous abnormality within the limitations of this  arterial phase study. Review of the MIP images confirms the above findings. NON-VASCULAR Hepatobiliary: Diffusely decreased attenuation of the hepatic parenchyma. No focal hepatic lesion is identified on arterial phase imaging. Status post cholecystectomy. No biliary dilatation. Pancreas: There is peripancreatic fat stranding around the pancreatic head and proximal body (series 7, image 93). No pancreatic ductal dilatation. No evidence of parenchymal non enhancement. No peripancreatic fluid collection. Spleen: 1.8 x 0.9 cm rounded area of hypoattenuation within the anterior aspect of the spleen (series 7, image 84; series 12, image 160) measuring greater than fluid density. Spleen is otherwise within normal limits. There are 2 small splenules in the region of the splenic hilum. Adrenals/Urinary Tract: No adrenal nodules or masses. There are wedge-shaped areas of decreased enhancement within the inferior pole of the right kidney (series 10, images 117 and 121). Possible tiny area of hypoattenuating cortex in the inferior pole of the left kidney (series 10, image 119). Kidneys have otherwise symmetric enhancement. No enhancing renal mass, stone, or hydronephrosis. Urinary bladder unremarkable. Stomach/Bowel: Small hiatal hernia. Stomach is otherwise within normal limits. Appendix appears normal (series 7, image 159). No evidence of bowel wall thickening, distention, or inflammatory changes. Moderate volume stool within the colon. Lymphatic: No abdominopelvic lymphadenopathy. Reproductive: Uterus and bilateral adnexa are unremarkable. Other: No free fluid. No abdominopelvic fluid collection. No pneumoperitoneum. No abdominal wall hernia. Musculoskeletal: No acute or significant osseous findings. Review of the MIP images confirms the above findings. IMPRESSION: Vascular findings: 1. Negative for aortic aneurysm or dissection. 2. Small wedge-shaped areas of decreased enhancement within the inferior poles of the right  kidney and left kidney, concerning for areas of focal renal infarction. Pyelonephritis could have a similar appearance. Correlation with urinalysis is recommended. 3. Focal 11 x 5 mm area of noncalcified atherosclerotic plaque with a slightly polypoid configuration projecting into the lumen of the distal descending thoracic aorta. This may be at risk for potential plaque thrombosis. Nonvascular findings: 1. Findings of acute uncomplicated pancreatitis. No evidence of pancreatic necrosis or peripancreatic fluid collection. Correlate with serum lipase. 2. Findings suggestive of hepatic steatosis. 3. Indeterminate 1.8 cm rounded area of hypoattenuation within the anterior aspect of the spleen. Further evaluation with contrast-enhanced MRI can be performed on a nonemergent basis. 4. Slightly mottled appearance of the  left third rib anteriorly with some adjacent scarring in the adjacent lung, potentially representing post radiation changes. 5. Left breast skin thickening with suspected lumpectomy site at the inner left breast. Continued mammographic follow-up is recommended. Electronically Signed   By: Davina Poke D.O.   On: 08/20/2020 11:40   US Abdomen Limited RUQ (LIVER/GB)  Result Date: 08/20/2020 CLINICAL DATA:  Pancreatitis.  Right upper quadrant pain.  Nausea. EXAM: ULTRASOUND ABDOMEN LIMITED RIGHT UPPER QUADRANT COMPARISON:  Dissection protocol CT earlier today. FINDINGS: Gallbladder: Surgically absent. Common bile duct: Diameter: 4 mm, nondilated. Liver: Heterogeneously increased in parenchymal echogenicity. Liver parenchyma is difficult to penetrate. No focal abnormalities seen by ultrasound. Portal vein is patent on color Doppler imaging with normal direction of blood flow towards the liver. Other: No right upper quadrant ascites. IMPRESSION: 1. Post cholecystectomy without biliary dilatation. 2. Heterogeneous hepatic parenchyma which is difficult to penetrate, typical of steatosis. Electronically  Signed   By: Keith Rake M.D.   On: 08/20/2020 19:46      The results of significant diagnostics from this hospitalization (including imaging, microbiology, ancillary and laboratory) are listed below for reference.     Microbiology: Recent Results (from the past 240 hour(s))  Respiratory Panel by RT PCR (Flu A&B, Covid) - Nasopharyngeal Swab     Status: None   Collection Time: 08/20/20 11:47 AM   Specimen: Nasopharyngeal Swab  Result Value Ref Range Status   SARS Coronavirus 2 by RT PCR NEGATIVE NEGATIVE Final    Comment: (NOTE) SARS-CoV-2 target nucleic acids are NOT DETECTED.  The SARS-CoV-2 RNA is generally detectable in upper respiratoy specimens during the acute phase of infection. The lowest concentration of SARS-CoV-2 viral copies this assay can detect is 131 copies/mL. A negative result does not preclude SARS-Cov-2 infection and should not be used as the sole basis for treatment or other patient management decisions. A negative result may occur with  improper specimen collection/handling, submission of specimen other than nasopharyngeal swab, presence of viral mutation(s) within the areas targeted by this assay, and inadequate number of viral copies (<131 copies/mL). A negative result must be combined with clinical observations, patient history, and epidemiological information. The expected result is Negative.  Fact Sheet for Patients:  PinkCheek.be  Fact Sheet for Healthcare Providers:  GravelBags.it  This test is no t yet approved or cleared by the [place] FDA and  has been authorized for detection and/or diagnosis of SARS-CoV-2 by FDA under an Emergency Use Authorization (EUA). This EUA will remain  in effect (meaning this test can be used) for the duration of the COVID-19 declaration under Section 564(b)(1) of the Act, 21 U.S.C. section 360bbb-3(b)(1), unless the authorization is terminated  or revoked sooner.     Influenza A by PCR NEGATIVE NEGATIVE Final   Influenza B by PCR NEGATIVE NEGATIVE Final    Comment: (NOTE) The Xpert Xpress SARS-CoV-2/FLU/RSV assay is intended as an aid in  the diagnosis of influenza from Nasopharyngeal swab specimens and  should not be used as a sole basis for treatment. Nasal washings and  aspirates are unacceptable for Xpert Xpress SARS-CoV-2/FLU/RSV  testing.  Fact Sheet for Patients: PinkCheek.be  Fact Sheet for Healthcare Providers: GravelBags.it  This test is not yet approved or cleared by the [place] FDA and  has been authorized for detection and/or diagnosis of SARS-CoV-2 by  FDA under an Emergency Use Authorization (EUA). This EUA will remain  in effect (meaning this test can be used) for the duration of the  Covid-19 declaration under Section 564(b)(1) of the Act, 21  U.S.C. section 360bbb-3(b)(1), unless the authorization is  terminated or revoked. Performed at [facility], [place] [address] [place], [place] [address]      Labs: BNP (last 3 results) No results for input(s): BNP in the last 8760 hours. Basic Metabolic Panel: Recent Labs  Lab 08/21/20 0558 08/22/20 0016 08/23/20 0558 08/24/20 1454 08/25/20 0954  NA 136 137 137 136 137  K 3.6 3.3* 3.8 5.1 5.4*  CL 106 106 106 105 109  CO2 24 22 22 23 22   GLUCOSE 103* 68* 173* 285* 209*  BUN 9 6 5* 5* <5*  CREATININE 0.71 0.66 0.77 0.64 0.58  CALCIUM 8.6* 8.6* 9.0 8.6* 8.5*  MG 1.7 1.5* 1.5* 1.3* 1.2*   Liver Function Tests: Recent Labs  Lab 08/20/20 1223 08/23/20 0558  AST 23 13*  ALT 23 15  ALKPHOS 72 60  BILITOT 0.9 0.8  PROT 6.1* 5.8*  ALBUMIN 3.4* 2.8*   Recent Labs  Lab 08/20/20 1223 08/22/20 0016 08/23/20 0558  LIPASE 148* 57* 48   No results for input(s): AMMONIA in the last 168 hours. CBC: Recent Labs  Lab 08/20/20 1027 08/20/20 1040 08/21/20 0558 08/22/20 0016  08/23/20 0558  WBC 12.8*  --  9.2 6.3 5.1  HGB 13.2 13.6 12.2 10.7* 11.9*  HCT 38.3 40.0 35.7* 31.5* 35.2*  MCV 89.3  --  88.8 89.2 90.5  PLT 408*  --  327 263 273   Cardiac Enzymes: No results for input(s): CKTOTAL, CKMB, CKMBINDEX, TROPONINI in the last 168 hours. BNP: Invalid input(s): POCBNP CBG: Recent Labs  Lab 08/24/20 1720 08/24/20 2114 08/25/20 0615 08/25/20 1110 08/25/20 1605  GLUCAP 167* 162* 190* 221* 187*   D-Dimer No results for input(s): DDIMER in the last 72 hours. Hgb A1c No results for input(s): HGBA1C in the last 72 hours. Lipid Profile No results for input(s): CHOL, HDL, LDLCALC, TRIG, CHOLHDL, LDLDIRECT in the last 72 hours. Thyroid function studies No results for input(s): TSH, T4TOTAL, T3FREE, THYROIDAB in the last 72 hours.  Invalid input(s): FREET3 Anemia work up No results for input(s): VITAMINB12, FOLATE, FERRITIN, TIBC, IRON, RETICCTPCT in the last 72 hours. Urinalysis    Component Value Date/Time   COLORURINE STRAW (A) 08/25/2020 1221   APPEARANCEUR CLEAR 08/25/2020 1221   LABSPEC 1.004 (L) 08/25/2020 1221   PHURINE 6.0 08/25/2020 1221   GLUCOSEU 50 (A) 08/25/2020 1221   HGBUR NEGATIVE 08/25/2020 [place] 08/25/2020 1221   KETONESUR NEGATIVE 08/25/2020 1221   PROTEINUR NEGATIVE 08/25/2020 1221   NITRITE NEGATIVE 08/25/2020 1221   LEUKOCYTESUR NEGATIVE 08/25/2020 1221   Sepsis Labs Invalid input(s): PROCALCITONIN,  WBC,  LACTICIDVEN Microbiology Recent Results (from the past 240 hour(s))  Respiratory Panel by RT PCR (Flu A&B, Covid) - Nasopharyngeal Swab     Status: None   Collection Time: 08/20/20 11:47 AM   Specimen: Nasopharyngeal Swab  Result Value Ref Range Status   SARS Coronavirus 2 by RT PCR NEGATIVE NEGATIVE Final    Comment: (NOTE) SARS-CoV-2 target nucleic acids are NOT DETECTED.  The SARS-CoV-2 RNA is generally detectable in upper respiratoy specimens during the acute phase of infection. The  lowest concentration of SARS-CoV-2 viral copies this assay can detect is 131 copies/mL. A negative result does not preclude SARS-Cov-2 infection and should not be used as the sole basis for treatment or other patient management decisions. A negative result may occur with  improper specimen collection/handling,  submission of specimen other than nasopharyngeal swab, presence of viral mutation(s) within the areas targeted by this assay, and inadequate number of viral copies (<131 copies/mL). A negative result must be combined with clinical observations, patient history, and epidemiological information. The expected result is Negative.  Fact Sheet for Patients:  PinkCheek.be  Fact Sheet for Healthcare Providers:  GravelBags.it  This test is no t yet approved or cleared by the [place] FDA and  has been authorized for detection and/or diagnosis of SARS-CoV-2 by FDA under an Emergency Use Authorization (EUA). This EUA will remain  in effect (meaning this test can be used) for the duration of the COVID-19 declaration under Section 564(b)(1) of the Act, 21 U.S.C. section 360bbb-3(b)(1), unless the authorization is terminated or revoked sooner.     Influenza A by PCR NEGATIVE NEGATIVE Final   Influenza B by PCR NEGATIVE NEGATIVE Final    Comment: (NOTE) The Xpert Xpress SARS-CoV-2/FLU/RSV assay is intended as an aid in  the diagnosis of influenza from Nasopharyngeal swab specimens and  should not be used as a sole basis for treatment. Nasal washings and  aspirates are unacceptable for Xpert Xpress SARS-CoV-2/FLU/RSV  testing.  Fact Sheet for Patients: PinkCheek.be  Fact Sheet for Healthcare Providers: GravelBags.it  This test is not yet approved or cleared by the [place] FDA and  has been authorized for detection and/or diagnosis of SARS-CoV-2 by  FDA under  an Emergency Use Authorization (EUA). This EUA will remain  in effect (meaning this test can be used) for the duration of the  Covid-19 declaration under Section 564(b)(1) of the Act, 21  U.S.C. section 360bbb-3(b)(1), unless the authorization is  terminated or revoked. Performed at [facility], [place] [address], [place], [place] [address]      Time coordinating discharge in minutes: 65  SIGNED:   Debbe Odea, MD  [facility] 08/26/2020, 5:42 PM

## 2020-08-27 NOTE — Telephone Encounter (Signed)
Lm will call back later Pt is at DR appt ./cy

## 2020-08-28 NOTE — Telephone Encounter (Signed)
Patient contacted regarding discharge from [facility] on 08/26/20.  Patient understands to follow up with provider cleaver on 09/04/20 at 9:15 am at [facility]. Patient understands discharge instructions? yes Patient understands medications and regiment? yes Patient understands to bring all medications to this visit? yes  Ask patient:  Are you enrolled in My Chart (yes or no)  If no ask patient if they would like to enroll.

## 2020-09-03 NOTE — Progress Notes (Signed)
Cardiology Clinic Note   Patient Name: Ariana Herrera Date of Encounter: 09/04/2020  Primary Care Provider:  Dulce Sellar, MD Primary Cardiologist:  Pixie Casino, MD  Patient Profile    Ariana Herrera 54 year old female presents the clinic today status post NSTEMI.  Past Medical History    Past Medical History:  Diagnosis Date  . Anginal pain ([place])   . Asthma   . Bipolar 1 disorder ([place])   . Bipolar disease, chronic ([place])   . Breast cancer in female [facility])   . Coronary artery disease   . Deep vein blood clot of right lower extremity ([place])   . Diabetes mellitus type 2 in obese ([place])   . Hyperlipidemia   . MI (myocardial infarction) ([place])    was in [place]  . Obesity   . Personal history of chemotherapy   . Personal history of radiation therapy    Past Surgical History:  Procedure Laterality Date  . BREAST LUMPECTOMY Left 03/2019  . IR REMOVAL TUN ACCESS W/ PORT W/O FL MOD SED  12/20/2019  . LEFT HEART CATH AND CORONARY ANGIOGRAPHY N/A 08/22/2020   Procedure: LEFT HEART CATH AND CORONARY ANGIOGRAPHY;  Surgeon: Burnell Blanks, MD;  Location: [facility];  Service: Cardiovascular;  Laterality: N/A;    Allergies  Allergies  Allergen Reactions  . Lithium Other (See Comments)    Spinal fluid built up in brain  . Trulicity [Dulaglutide] Nausea And Vomiting  . Penicillins Other (See Comments)    UNKNOWN CHILDHOOD REACTION    History of Present Illness    Ms. Ariana Herrera is a PMH of DVT right lower extremity, acute pancreatitis, type 2 diabetes chemotherapy-induced neuropathy, obesity, breast CA, bipolar disease, and NSTEMI.  She underwent cardiac catheterization 08/22/2020.  Her cardiac catheterization showed proximal RCA lesion 30%, mid RCA lesion 40%, mid-distal RCA lesion 20%, and OST LAD-proximal LAD 20%.  Medical management was recommended.  She presented to the hospital on 08/20/2020 and was discharged on 08/26/2020.  She awoke at night with severe  abdominal pain.  She indicated that her pain increased intensity over a 2-hour time span when she dialed 911.  She reported nausea and vomiting.  She also reported severe substernal pain that radiated to her back.  The pain was decreased with pain medication.  She indicated that she had been in the emergency department for years prior with similar symptoms/chest discomfort and was told that it was GERD.  She was discharged without further interventions.  1-2 days after the incident she presented back to the ED underwent evaluation.  She was informed that she had a heart attack but a stent was not needed.  During this admission she underwent abdominal CT which was consistent with acute pancreatitis and fatty liver.  It was felt that this possibly secondary to Trulicity which was placed on hold.  She was changed to n.p.o. status and her pain improved.  She was discharged in stable condition and told to follow-up with cardiology and her PCP.  She presents the clinic today for follow-up evaluation states she feels well today.  She has had no recurrent episodes of chest discomfort.  We reviewed her cardiac catheterization results.  She states she started smoking again when she left the hospital but plans to quit on December 1.  She also has nicotine patches which she is planning to use.  She reports controlled blood pressure at home.  I will give her the salty 6 diet sheet, have her  increase her physical activity as tolerated and follow-up in 6 months.  Today she denies chest pain, shortness of breath, lower extremity edema, fatigue, palpitations, melena, hematuria, hemoptysis, diaphoresis, weakness, presyncope, syncope, orthopnea, and PND.   Home Medications    Prior to Admission medications   Medication Sig Start Date End Date Taking? Authorizing Provider  atorvastatin (LIPITOR) 40 MG tablet Take 1 tablet (40 mg total) by mouth daily. 08/26/20   Debbe Odea, MD  fenofibrate (TRICOR) 145 MG tablet Take 145  mg by mouth daily.    [provider]  HYDROcodone-acetaminophen (NORCO/VICODIN) 5-325 MG tablet Take 1 tablet by mouth in the morning, at noon, and at bedtime.  11/26/19   [provider]  insulin aspart (NOVOLOG FLEXPEN) 100 UNIT/ML FlexPen Inject 3 Units into the skin 3 (three) times daily with meals. 08/25/20   Debbe Odea, MD  insulin glargine (LANTUS) 100 UNIT/ML injection Inject 25 Units into the skin at bedtime.    [provider]  linaclotide (LINZESS) 290 MCG CAPS capsule Take 290 mcg by mouth daily as needed.    [provider]  losartan (COZAAR) 25 MG tablet Take 1 tablet (25 mg total) by mouth daily. 08/26/20   Debbe Odea, MD  metFORMIN (GLUCOPHAGE) 1000 MG tablet Take 1,000 mg by mouth 2 (two) times daily with a meal. 11/26/19   [provider]  metoprolol succinate (TOPROL-XL) 25 MG 24 hr tablet Take 1 tablet (25 mg total) by mouth daily. 08/26/20   Debbe Odea, MD  nicotine (NICODERM CQ - DOSED IN MG/24 HOURS) 21 mg/24hr patch Place 1 patch (21 mg total) onto the skin daily. 08/26/20   Debbe Odea, MD  QUEtiapine (SEROQUEL) 400 MG tablet Take 800 mg by mouth at bedtime.    [provider]    Family History    Family History  Problem Relation Age of Onset  . Heart attack Mother 13   She indicated that her mother is alive.  Social History    Social History   Socioeconomic History  . Marital status: Unknown    Spouse name: Not on file  . Number of children: Not on file  . Years of education: Not on file  . Highest education level: Not on file  Occupational History  . Occupation: disabled due to neuropathy and cancer treatments  Tobacco Use  . Smoking status: Current Every Day Smoker    Packs/day: 2.00    Years: 36.00    Pack years: 72.00    Types: Cigarettes    Start date: 11/26/2019  . Smokeless tobacco: Never Used  Vaping Use  . Vaping Use: Never used  Substance and Sexual Activity  . Alcohol use: Never  .  Drug use: Yes    Types: Marijuana    Comment: occasionally  . Sexual activity: Not on file  Other Topics Concern  . Not on file  Social History Narrative  . Not on file   Social Determinants of Health   Financial Resource Strain:   . Difficulty of Paying Living Expenses: Not on file  Food Insecurity:   . Worried About Charity fundraiser in the Last Year: Not on file  . Ran Out of Food in the Last Year: Not on file  Transportation Needs:   . Lack of Transportation (Medical): Not on file  . Lack of Transportation (Non-Medical): Not on file  Physical Activity:   . Days of Exercise per Week: Not on file  . Minutes of Exercise per  Session: Not on file  Stress:   . Feeling of Stress : Not on file  Social Connections:   . Frequency of Communication with Friends and Family: Not on file  . Frequency of Social Gatherings with Friends and Family: Not on file  . Attends Religious Services: Not on file  . Active Member of Clubs or Organizations: Not on file  . Attends Archivist Meetings: Not on file  . Marital Status: Not on file  Intimate Partner Violence:   . Fear of Current or Ex-Partner: Not on file  . Emotionally Abused: Not on file  . Physically Abused: Not on file  . Sexually Abused: Not on file     Review of Systems    General:  No chills, fever, night sweats or weight changes.  Cardiovascular:  No chest pain, dyspnea on exertion, edema, orthopnea, palpitations, paroxysmal nocturnal dyspnea. Dermatological: No rash, lesions/masses Respiratory: No cough, dyspnea Urologic: No hematuria, dysuria Abdominal:   No nausea, vomiting, diarrhea, bright red blood per rectum, melena, or hematemesis Neurologic:  No visual changes, wkns, changes in mental status. All other systems reviewed and are otherwise negative except as noted above.  Physical Exam    VS:  BP 126/72   Pulse 88   Ht 5\' 2"  (1.575 m)   Wt 165 lb 6.4 oz (75 kg)   BMI 30.25 kg/m  , BMI Body mass index  is 30.25 kg/m. GEN: Well nourished, well developed, in no acute distress. HEENT: normal. Neck: Supple, no JVD, carotid bruits, or masses. Cardiac: RRR, no murmurs, rubs, or gallops. No clubbing, cyanosis, edema.  Radials/DP/PT 2+ and equal bilaterally.  Respiratory:  Respirations regular and unlabored, clear to auscultation bilaterally. GI: Soft, nontender, nondistended, BS + x 4. MS: no deformity or atrophy. Skin: warm and dry, no rash. Neuro:  Strength and sensation are intact. Psych: Normal affect.  Accessory Clinical Findings    Recent Labs: 08/20/2020: TSH 1.011 08/23/2020: ALT 15; Hemoglobin 11.9; Platelets 273 08/25/2020: BUN <5; Creatinine, Ser 0.58; Magnesium 1.2; Potassium 5.4; Sodium 137   Recent Lipid Panel    Component Value Date/Time   CHOL 135 08/21/2020 0558   TRIG 227 (H) 08/21/2020 0558   HDL 29 (L) 08/21/2020 0558   CHOLHDL 4.7 08/21/2020 0558   VLDL 45 (H) 08/21/2020 0558   LDLCALC 61 08/21/2020 0558    ECG personally reviewed by me today-none today. CT angio chest/abdomen  IMPRESSION: Vascular findings:  1. Negative for aortic aneurysm or dissection. 2. Small wedge-shaped areas of decreased enhancement within the inferior poles of the right kidney and left kidney, concerning for areas of focal renal infarction. Pyelonephritis could have a similar appearance. Correlation with urinalysis is recommended. 3. Focal 11 x 5 mm area of noncalcified atherosclerotic plaque with a slightly polypoid configuration projecting into the lumen of the distal descending thoracic aorta. This may be at risk for potential plaque thrombosis.  Nonvascular findings:  1. Findings of acute uncomplicated pancreatitis. No evidence of pancreatic necrosis or peripancreatic fluid collection. Correlate with serum lipase. 2. Findings suggestive of hepatic steatosis. 3. Indeterminate 1.8 cm rounded area of hypoattenuation within the anterior aspect of the spleen. Further  evaluation with contrast-enhanced MRI can be performed on a nonemergent basis. 4. Slightly mottled appearance of the left third rib anteriorly with some adjacent scarring in the adjacent lung, potentially representing post radiation changes. 5. Left breast skin thickening with suspected lumpectomy site at the inner left breast. Continued mammographic follow-up is recommended.  Echocardiogram 08/13/2020 IMPRESSIONS    1. Left ventricular ejection fraction, by estimation, is 60 to 65%. The  left ventricle has normal function. The left ventricle has no regional  wall motion abnormalities. Left ventricular diastolic parameters were  normal.  2. Right ventricular systolic function is normal. The right ventricular  size is normal. Tricuspid regurgitation signal is inadequate for assessing  PA pressure.  3. The mitral valve is normal in structure. Trivial mitral valve  regurgitation. No evidence of mitral stenosis.  4. The aortic valve is tricuspid. Aortic valve regurgitation is not  visualized. No aortic stenosis is present.  5. The inferior vena cava is normal in size with greater than 50%  respiratory variability, suggesting right atrial pressure of 3 mmHg.  Cardiac catheterization 08/22/2020   Prox RCA lesion is 30% stenosed.  Mid RCA lesion is 40% stenosed.  Mid RCA to Dist RCA lesion is 20% stenosed.  Ost LAD to Prox LAD lesion is 20% stenosed.   1. Mild non-obstructive CAD 2. Elevated left filling pressures.   Recommendations: Medical management of mild CAD   Diagnostic Dominance: Right  Intervention    Assessment & Plan   1.  NSTEMI/nonobstructive CAD -no chest pain today.  Underwent cardiac catheterization 08/22/2020 which showed nonobstructive CAD.  Medical management was recommended. Continue atorvastatin, metoprolol, losartan, fenofibrate Heart healthy low-sodium diet-salty 6 given Increase physical activity as  tolerated  Hyperlipidemia-08/21/2020: Cholesterol 135; HDL 29; LDL Cholesterol 61; Triglycerides 227; VLDL 45 Continue atorvastatin, fenofibrate Heart healthy low-sodium high-fiber diet.   Increase physical activity as tolerated  Essential hypertension-BP today 126/72.  Currently well controlled at home. Continue metoprolol, losartan Heart healthy low-sodium diet-salty 6 given Increase physical activity as tolerated Order BMP  Acute pancreatitis-no abdominal pain today.  Had CT abdomen 08/20/2020 which showed acute pancreatitis and fatty liver disease.  Negative for aortic aneurysm or dissection.  No history of EtOH abuse.  Details above.  Pain improved with making her n.p.o.  It was felt that her Trulicity may have been a contributing factor. Follow-up with PCP  Disposition: Follow-up with Dr. Debara Pickett or me in 3 months.   Jossie Ng. Derica Leiber NP-C    09/04/2020, 9:25 AM [place] [address] Office [phone] Fax [phone]  Notice: This dictation was prepared with Dragon dictation along with smaller phrase technology. Any transcriptional errors that result from this process are unintentional and may not be corrected upon review.

## 2020-09-04 ENCOUNTER — Other Ambulatory Visit: Payer: Self-pay

## 2020-09-04 ENCOUNTER — Encounter: Payer: Self-pay | Admitting: General Practice

## 2020-09-04 ENCOUNTER — Ambulatory Visit (INDEPENDENT_AMBULATORY_CARE_PROVIDER_SITE_OTHER): Payer: Medicaid Other | Admitting: General Practice

## 2020-09-04 VITALS — BP 126/72 | HR 88 | Ht 62.0 in | Wt 165.4 lb

## 2020-09-04 DIAGNOSIS — E782 Mixed hyperlipidemia: Secondary | ICD-10-CM | POA: Diagnosis not present

## 2020-09-04 DIAGNOSIS — I10 Essential (primary) hypertension: Secondary | ICD-10-CM | POA: Diagnosis not present

## 2020-09-04 DIAGNOSIS — I214 Non-ST elevation (NSTEMI) myocardial infarction: Secondary | ICD-10-CM | POA: Diagnosis not present

## 2020-09-04 DIAGNOSIS — K858 Other acute pancreatitis without necrosis or infection: Secondary | ICD-10-CM | POA: Diagnosis not present

## 2020-09-04 MED ORDER — NITROGLYCERIN 0.4 MG SL SUBL: 0.4000 mg | SUBLINGUAL_TABLET | SUBLINGUAL | 3 refills | Status: DC | PRN

## 2020-09-04 NOTE — Patient Instructions (Signed)
Medication Instructions:  The current medical regimen is effective;  continue present plan and medications as directed. Please refer to the Current Medication list given to you today.  *If you need a refill on your cardiac medications before your next appointment, please call your pharmacy*  Lab Work:   Testing/Procedures:  NONE    NONE  Special Instructions PLEASE READ AND FOLLOW HEART HEALTHY DIET-ATTACHED  PLEASE READ AND FOLLOW SALTY 6-ATTACHED-1,800mg  daily  PLEASE INCREASE PHYSICAL ACTIVITY AS TOLERATED  Follow-Up: Your next appointment:  3 month(s) In Person with K. Mali Hilty, MD OR IF UNAVAILABLE [place], FNP-C At [facility], you and your health needs are our priority.  As part of our continuing mission to provide you with exceptional heart care, we have created designated Provider Care Teams.  These Care Teams include your primary Cardiologist (physician) and Advanced Practice Providers (APPs -  Physician Assistants and Nurse Practitioners) who all work together to provide you with the care you need, when you need it.  We recommend signing up for the patient portal called "MyChart".  Sign up information is provided on this After Visit Summary.  MyChart is used to connect with patients for Virtual Visits (Telemedicine).  Patients are able to view lab/test results, encounter notes, upcoming appointments, etc.  Non-urgent messages can be sent to your provider as well.   To learn more about what you can do with MyChart, go to [email or web address].              6 SALTY THINGS TO AVOID     1,800MG  DAILY     HEART HEALTHY DIET Getting too much fat and cholesterol in your diet may cause health problems. Choosing the right foods helps keep your fat and cholesterol at normal levels. This can keep you from getting certain diseases.  What are tips for following this plan? Meal planning  At meals, divide your plate into four equal parts: ? Fill one-half of your plate with  vegetables and green salads. ? Fill one-fourth of your plate with whole grains. ? Fill one-fourth of your plate with low-fat (lean) protein foods.  Eat fish that is high in omega-3 fats at least two times a week. This includes mackerel, tuna, sardines, and salmon.  Eat foods that are high in fiber, such as whole grains, beans, apples, broccoli, carrots, peas, and barley. General tips   Work with your doctor to lose weight if you need to.  Avoid: ? Foods with added sugar. ? Fried foods. ? Foods with partially hydrogenated oils.  Limit alcohol intake to no more than 1 drink a day for nonpregnant women and 2 drinks a day for men. One drink equals 12 oz of beer, 5 oz of wine, or 1 oz of hard liquor. Reading food labels  Check food labels for: ? Trans fats. ? Partially hydrogenated oils. ? Saturated fat (g) in each serving. ? Cholesterol (mg) in each serving. ? Fiber (g) in each serving.  Choose foods with healthy fats, such as: ? Monounsaturated fats. ? Polyunsaturated fats. ? Omega-3 fats.  Choose grain products that have whole grains. Look for the word "whole" as the first word in the ingredient list. Cooking  Cook foods using low-fat methods. These include baking, boiling, grilling, and broiling.  Eat more home-cooked foods. Eat at restaurants and buffets less often.  Avoid cooking using saturated fats, such as butter, cream, palm oil, palm kernel oil, and coconut oil. Recommended foods  Fruits  All fresh, canned (in natural  juice), or frozen fruits. Vegetables  Fresh or frozen vegetables (raw, steamed, roasted, or grilled). Green salads. Grains  Whole grains, such as whole wheat or whole grain breads, crackers, cereals, and pasta. Unsweetened oatmeal, bulgur, barley, quinoa, or brown rice. Corn or whole wheat flour tortillas. Meats and other protein foods  Ground beef (85% or leaner), grass-fed beef, or beef trimmed of fat. Skinless chicken or [place]. Ground  chicken or [place]. Pork trimmed of fat. All fish and seafood. Egg whites. Dried beans, peas, or lentils. Unsalted nuts or seeds. Unsalted canned beans. Nut butters without added sugar or oil. Dairy  Low-fat or nonfat dairy products, such as skim or 1% milk, 2% or reduced-fat cheeses, low-fat and fat-free ricotta or cottage cheese, or plain low-fat and nonfat yogurt. Fats and oils  Tub margarine without trans fats. Light or reduced-fat mayonnaise and salad dressings. Avocado. Olive, canola, sesame, or safflower oils. The items listed above may not be a complete list of foods and beverages you can eat. Contact a dietitian for more information. Foods to avoid Fruits  Canned fruit in heavy syrup. Fruit in cream or butter sauce. Fried fruit. Vegetables  Vegetables cooked in cheese, cream, or butter sauce. Fried vegetables. Grains  White bread. White pasta. White rice. Cornbread. Bagels, pastries, and croissants. Crackers and snack foods that contain trans fat and hydrogenated oils. Meats and other protein foods  Fatty cuts of meat. Ribs, chicken wings, bacon, sausage, bologna, salami, chitterlings, fatback, hot dogs, bratwurst, and packaged lunch meats. Liver and organ meats. Whole eggs and egg yolks. Chicken and [place] with skin. Fried meat. Dairy  Whole or 2% milk, cream, half-and-half, and cream cheese. Whole milk cheeses. Whole-fat or sweetened yogurt. Full-fat cheeses. Nondairy creamers and whipped toppings. Processed cheese, cheese spreads, and cheese curds. Beverages  Alcohol. Sugar-sweetened drinks such as sodas, lemonade, and fruit drinks. Fats and oils  Butter, stick margarine, lard, shortening, ghee, or bacon fat. Coconut, palm kernel, and palm oils. Sweets and desserts  Corn syrup, sugars, honey, and molasses. Candy. Jam and jelly. Syrup. Sweetened cereals. Cookies, pies, cakes, donuts, muffins, and ice cream. The items listed above may not be a complete list of foods and  beverages you should avoid. Contact a dietitian for more information. Summary  Choosing the right foods helps keep your fat and cholesterol at normal levels. This can keep you from getting certain diseases.  At meals, fill one-half of your plate with vegetables and green salads.  Eat high-fiber foods, like whole grains, beans, apples, carrots, peas, and barley.  Limit added sugar, saturated fats, alcohol, and fried foods. This information is not intended to replace advice given to you by your health care provider. Make sure you discuss any questions you have with your health care provider. Document Revised: 06/14/2018 Document Reviewed: 06/28/2017 Elsevier Patient Education  [place].

## 2020-11-27 ENCOUNTER — Inpatient Hospital Stay: Payer: Medicaid Other | Admitting: Family

## 2020-11-27 ENCOUNTER — Inpatient Hospital Stay: Payer: Medicaid Other

## 2020-12-04 NOTE — Progress Notes (Signed)
Cardiology Clinic Note   Patient Name: Ariana Herrera Date of Encounter: 12/05/2020  Primary Care Provider:  Dulce Sellar, MD Primary Cardiologist:  Pixie Casino, MD  Patient Profile    Ariana Herrera 55 year old female presents the clinic today for follow-up evaluation of her coronary artery disease.  Past Medical History    Past Medical History:  Diagnosis Date  . Anginal pain ([place])   . Asthma   . Bipolar 1 disorder ([place])   . Bipolar disease, chronic ([place])   . Breast cancer in female [facility])   . Coronary artery disease   . Deep vein blood clot of right lower extremity ([place])   . Diabetes mellitus type 2 in obese ([place])   . Hyperlipidemia   . MI (myocardial infarction) ([place])    was in [place]  . Obesity   . Personal history of chemotherapy   . Personal history of radiation therapy    Past Surgical History:  Procedure Laterality Date  . BREAST LUMPECTOMY Left 03/2019  . IR REMOVAL TUN ACCESS W/ PORT W/O FL MOD SED  12/20/2019  . LEFT HEART CATH AND CORONARY ANGIOGRAPHY N/A 08/22/2020   Procedure: LEFT HEART CATH AND CORONARY ANGIOGRAPHY;  Surgeon: Burnell Blanks, MD;  Location: [facility];  Service: Cardiovascular;  Laterality: N/A;    Allergies  Allergies  Allergen Reactions  . Lithium Other (See Comments)    Spinal fluid built up in brain  . Trulicity [Dulaglutide] Nausea And Vomiting  . Penicillins Other (See Comments)    UNKNOWN CHILDHOOD REACTION    History of Present Illness    Ariana Herrera is a PMH of DVT right lower extremity, acute pancreatitis, type 2 diabetes chemotherapy-induced neuropathy, obesity, breast CA, bipolar disease, and NSTEMI.  She underwent cardiac catheterization 08/22/2020.  Her cardiac catheterization showed proximal RCA lesion 30%, mid RCA lesion 40%, mid-distal RCA lesion 20%, and OST LAD-proximal LAD 20%.  Medical management was recommended.  She presented to the hospital on 08/20/2020 and was discharged on  08/26/2020.  She awoke at night with severe abdominal pain.  She indicated that her pain increased intensity over a 2-hour time span when she dialed 911.  She reported nausea and vomiting.  She also reported severe substernal pain that radiated to her back.  The pain was decreased with pain medication.  She indicated that she had been in the emergency department for years prior with similar symptoms/chest discomfort and was told that it was GERD.  She was discharged without further interventions.  1-2 days after the incident she presented back to the ED underwent evaluation.  She was informed that she had a heart attack but a stent was not needed.  During this admission she underwent abdominal CT which was consistent with acute pancreatitis and fatty liver.  It was felt that this possibly secondary to Trulicity which was placed on hold.  She was changed to n.p.o. status and her pain improved.  She was discharged in stable condition and told to follow-up with cardiology and her PCP.  She presented to the clinic 09/04/2020 for follow-up evaluation stated she felt well today.  She  had no recurrent episodes of chest discomfort.  We reviewed her cardiac catheterization results.  She stated she started smoking again when she left the hospital but plans to quit on December 1.  She also had nicotine patches which she was planning to use.  She reported controlled blood pressure at home.  We reviewed her lipid panel  and reviewed her cardiac catheterization.  She expressed understanding. I  gave her the salty 6 diet sheet, had her increase her physical activity as tolerated and follow-up in 6 months.  She presents the clinic today for follow-up evaluation states states she feels well.  She has not had any further episodes of chest discomfort or increased shortness of breath.  She remains physically active doing indoor activities and chores around her house.  She is tolerating cancer treatment well and has not had any  trouble with her medications.  She reports that she is following a low-sodium diet.  She reports she had both COVID-19 vaccinations and is going to review getting a booster with her oncologist on Monday.  I will give her the salty 6 diet sheet, have her continue to increase her physical activity as tolerated-follow-up in 6 months.  Today she denies chest pain, shortness of breath, lower extremity edema, fatigue, palpitations, melena, hematuria, hemoptysis, diaphoresis, weakness, presyncope, syncope, orthopnea, and PND.  Home Medications    Prior to Admission medications   Medication Sig Start Date End Date Taking? Authorizing Provider  atorvastatin (LIPITOR) 40 MG tablet Take 1 tablet (40 mg total) by mouth daily. 08/26/20   Debbe Odea, MD  fenofibrate (TRICOR) 145 MG tablet Take 145 mg by mouth daily.    [provider]  HYDROcodone-acetaminophen (NORCO/VICODIN) 5-325 MG tablet Take 1 tablet by mouth in the morning, at noon, and at bedtime.  11/26/19   [provider]  insulin aspart (NOVOLOG FLEXPEN) 100 UNIT/ML FlexPen Inject 3 Units into the skin 3 (three) times daily with meals. 08/25/20   Debbe Odea, MD  insulin glargine (LANTUS) 100 UNIT/ML injection Inject 25 Units into the skin at bedtime.    [provider]  linaclotide (LINZESS) 290 MCG CAPS capsule Take 290 mcg by mouth daily as needed.    [provider]  losartan (COZAAR) 25 MG tablet Take 1 tablet (25 mg total) by mouth daily. 08/26/20   Debbe Odea, MD  metFORMIN (GLUCOPHAGE) 1000 MG tablet Take 1,000 mg by mouth 2 (two) times daily with a meal. 11/26/19   [provider]  metoprolol succinate (TOPROL-XL) 25 MG 24 hr tablet Take 1 tablet (25 mg total) by mouth daily. 08/26/20   Debbe Odea, MD  nicotine (NICODERM CQ - DOSED IN MG/24 HOURS) 21 mg/24hr patch Place 1 patch (21 mg total) onto the skin daily. Patient not taking: Reported on 09/04/2020 08/26/20   Debbe Odea, MD   nitroGLYCERIN (NITROSTAT) 0.4 MG SL tablet Place 1 tablet (0.4 mg total) under the tongue every 5 (five) minutes as needed for chest pain. 09/04/20   Deberah Pelton, NP  QUEtiapine (SEROQUEL) 400 MG tablet Take 800 mg by mouth at bedtime.    [provider]    Family History    Family History  Problem Relation Age of Onset  . Heart attack Mother 59   She indicated that her mother is alive.  Social History    Social History   Socioeconomic History  . Marital status: Unknown    Spouse name: Not on file  . Number of children: Not on file  . Years of education: Not on file  . Highest education level: Not on file  Occupational History  . Occupation: disabled due to neuropathy and cancer treatments  Tobacco Use  . Smoking status: Current Every Day Smoker    Packs/day: 2.00    Years: 36.00    Pack years: 72.00  Types: Cigarettes    Start date: 11/26/2019  . Smokeless tobacco: Never Used  Vaping Use  . Vaping Use: Never used  Substance and Sexual Activity  . Alcohol use: Never  . Drug use: Yes    Types: Marijuana    Comment: occasionally  . Sexual activity: Not on file  Other Topics Concern  . Not on file  Social History Narrative  . Not on file   Social Determinants of Health   Financial Resource Strain: Not on file  Food Insecurity: Not on file  Transportation Needs: Not on file  Physical Activity: Not on file  Stress: Not on file  Social Connections: Not on file  Intimate Partner Violence: Not on file     Review of Systems    General:  No chills, fever, night sweats or weight changes.  Cardiovascular:  No chest pain, dyspnea on exertion, edema, orthopnea, palpitations, paroxysmal nocturnal dyspnea. Dermatological: No rash, lesions/masses Respiratory: No cough, dyspnea Urologic: No hematuria, dysuria Abdominal:   No nausea, vomiting, diarrhea, bright red blood per rectum, melena, or hematemesis Neurologic:  No visual changes, wkns, changes in  mental status. All other systems reviewed and are otherwise negative except as noted above.  Physical Exam    VS:  BP 130/78   Pulse 90   Ht 5\' 2"  (1.575 m)   Wt 162 lb (73.5 kg)   SpO2 98%   BMI 29.63 kg/m  , BMI Body mass index is 29.63 kg/m. GEN: Well nourished, well developed, in no acute distress. HEENT: normal. Neck: Supple, no JVD, carotid bruits, or masses. Cardiac: RRR, no murmurs, rubs, or gallops. No clubbing, cyanosis, edema.  Radials/DP/PT 2+ and equal bilaterally.  Respiratory:  Respirations regular and unlabored, clear to auscultation bilaterally. GI: Soft, nontender, nondistended, BS + x 4. MS: no deformity or atrophy. Skin: warm and dry, no rash. Neuro:  Strength and sensation are intact. Psych: Normal affect.  Accessory Clinical Findings    Recent Labs: 08/20/2020: TSH 1.011 08/23/2020: ALT 15; Hemoglobin 11.9; Platelets 273 08/25/2020: BUN <5; Creatinine, Ser 0.58; Magnesium 1.2; Potassium 5.4; Sodium 137   Recent Lipid Panel    Component Value Date/Time   CHOL 135 08/21/2020 0558   TRIG 227 (H) 08/21/2020 0558   HDL 29 (L) 08/21/2020 0558   CHOLHDL 4.7 08/21/2020 0558   VLDL 45 (H) 08/21/2020 0558   LDLCALC 61 08/21/2020 0558    ECG personally reviewed by me today-none today.  CT angio chest/abdomen  IMPRESSION: Vascular findings:  1. Negative for aortic aneurysm or dissection. 2. Small wedge-shaped areas of decreased enhancement within the inferior poles of the right kidney and left kidney, concerning for areas of focal renal infarction. Pyelonephritis could have a similar appearance. Correlation with urinalysis is recommended. 3. Focal 11 x 5 mm area of noncalcified atherosclerotic plaque with a slightly polypoid configuration projecting into the lumen of the distal descending thoracic aorta. This may be at risk for potential plaque thrombosis.  Nonvascular findings:  1. Findings of acute uncomplicated pancreatitis. No evidence  of pancreatic necrosis or peripancreatic fluid collection. Correlate with serum lipase. 2. Findings suggestive of hepatic steatosis. 3. Indeterminate 1.8 cm rounded area of hypoattenuation within the anterior aspect of the spleen. Further evaluation with contrast-enhanced MRI can be performed on a nonemergent basis. 4. Slightly mottled appearance of the left third rib anteriorly with some adjacent scarring in the adjacent lung, potentially representing post radiation changes. 5. Left breast skin thickening with suspected lumpectomy site at the inner  left breast. Continued mammographic follow-up is recommended.   Echocardiogram 08/13/2020 IMPRESSIONS    1. Left ventricular ejection fraction, by estimation, is 60 to 65%. The  left ventricle has normal function. The left ventricle has no regional  wall motion abnormalities. Left ventricular diastolic parameters were  normal.  2. Right ventricular systolic function is normal. The right ventricular  size is normal. Tricuspid regurgitation signal is inadequate for assessing  PA pressure.  3. The mitral valve is normal in structure. Trivial mitral valve  regurgitation. No evidence of mitral stenosis.  4. The aortic valve is tricuspid. Aortic valve regurgitation is not  visualized. No aortic stenosis is present.  5. The inferior vena cava is normal in size with greater than 50%  respiratory variability, suggesting right atrial pressure of 3 mmHg.  Cardiac catheterization 08/22/2020   Prox RCA lesion is 30% stenosed.  Mid RCA lesion is 40% stenosed.  Mid RCA to Dist RCA lesion is 20% stenosed.  Ost LAD to Prox LAD lesion is 20% stenosed.  1. Mild non-obstructive CAD 2. Elevated left filling pressures.   Recommendations: Medical management of mild CAD   Diagnostic Dominance: Right  Intervention  Assessment & Plan   1.  NSTEMI/nonobstructive CAD -continues with no chest pain today.  Underwent cardiac  catheterization 08/22/2020 which showed nonobstructive CAD.  Medical management was recommended. Continue atorvastatin, metoprolol, losartan, fenofibrate Heart healthy low-sodium diet-salty 6 given Increase physical activity as tolerated  Hyperlipidemia-08/21/2020: Cholesterol 135; HDL 29; LDL Cholesterol 61; Triglycerides 227; VLDL 45 Continue atorvastatin, fenofibrate Heart healthy low-sodium high-fiber diet.   Increase physical activity as tolerated  Essential hypertension-BP today 130/78.  Currently well controlled at home. Continue metoprolol, losartan Heart healthy low-sodium diet-salty 6 given Increase physical activity as tolerated   Acute pancreatitis-no abdominal pain today.  Had CT abdomen 08/20/2020 which showed acute pancreatitis and fatty liver disease.  Negative for aortic aneurysm or dissection.  No history of EtOH abuse.  Details above.  Pain improved with making her n.p.o.  It was felt that her Trulicity may have been a contributing factor. Follow-up with PCP  Disposition: Follow-up with Dr. Debara Pickett  in 6 months.   Jossie Ng. Ethal Gotay NP-C    12/05/2020, 1:50 PM [place] [address] [phone] Fax [phone]  Notice: This dictation was prepared with Dragon dictation along with smaller phrase technology. Any transcriptional errors that result from this process are unintentional and may not be corrected upon review.  I spent 10 minutes examining this patient, reviewing medications, and using patient centered shared decision making involving her cardiac care.  Prior to her visit I spent greater than 20 minutes reviewing her past medical history,  medications, and prior cardiac tests.

## 2020-12-05 ENCOUNTER — Encounter: Payer: Self-pay | Admitting: General Practice

## 2020-12-05 ENCOUNTER — Other Ambulatory Visit: Payer: Self-pay

## 2020-12-05 ENCOUNTER — Ambulatory Visit (INDEPENDENT_AMBULATORY_CARE_PROVIDER_SITE_OTHER): Payer: Medicaid Other | Admitting: General Practice

## 2020-12-05 VITALS — BP 130/78 | HR 90 | Ht 62.0 in | Wt 162.0 lb

## 2020-12-05 DIAGNOSIS — K858 Other acute pancreatitis without necrosis or infection: Secondary | ICD-10-CM | POA: Diagnosis not present

## 2020-12-05 DIAGNOSIS — E782 Mixed hyperlipidemia: Secondary | ICD-10-CM | POA: Diagnosis not present

## 2020-12-05 DIAGNOSIS — I214 Non-ST elevation (NSTEMI) myocardial infarction: Secondary | ICD-10-CM

## 2020-12-05 DIAGNOSIS — I10 Essential (primary) hypertension: Secondary | ICD-10-CM

## 2020-12-05 MED ORDER — FENOFIBRATE 145 MG PO TABS: 145.0000 mg | ORAL_TABLET | Freq: Every day | ORAL | 3 refills | Status: DC

## 2020-12-05 MED ORDER — ATORVASTATIN CALCIUM 40 MG PO TABS: 40.0000 mg | ORAL_TABLET | Freq: Every day | ORAL | 3 refills | Status: DC

## 2020-12-05 NOTE — Patient Instructions (Signed)
Medication Instructions:  The current medical regimen is effective;  continue present plan and medications as directed. Please refer to the Current Medication list given to you today.  *If you need a refill on your cardiac medications before your next appointment, please call your pharmacy*  Lab Work:   Testing/Procedures:  NONE    NONE  Special Instructions PLEASE READ AND FOLLOW SALTY 6-ATTACHED-1,800mg  daily  PLEASE INCREASE PHYSICAL ACTIVITY AS TOLERATED  Follow-Up: Your next appointment:  6 month(s) In Person with K. Mali Hilty, MD OR IF UNAVAILABLE JESSE CLEAVER, FNP-C   Please call our office 2 months in advance to schedule this appointment   At [facility], you and your health needs are our priority.  As part of our continuing mission to provide you with exceptional heart care, we have created designated Provider Care Teams.  These Care Teams include your primary Cardiologist (physician) and Advanced Practice Providers (APPs -  Physician Assistants and Nurse Practitioners) who all work together to provide you with the care you need, when you need it.  We recommend signing up for the patient portal called "MyChart".  Sign up information is provided on this After Visit Summary.  MyChart is used to connect with patients for Virtual Visits (Telemedicine).  Patients are able to view lab/test results, encounter notes, upcoming appointments, etc.  Non-urgent messages can be sent to your provider as well.   To learn more about what you can do with MyChart, go to [email or web address].              6 SALTY THINGS TO AVOID     1,800MG  DAILY

## 2020-12-08 ENCOUNTER — Inpatient Hospital Stay: Payer: Medicaid Other

## 2020-12-08 ENCOUNTER — Inpatient Hospital Stay: Payer: Medicaid Other | Admitting: Family

## 2020-12-23 ENCOUNTER — Encounter: Payer: Self-pay | Admitting: Family

## 2020-12-23 ENCOUNTER — Other Ambulatory Visit: Payer: Self-pay

## 2020-12-23 ENCOUNTER — Telehealth: Payer: Self-pay

## 2020-12-23 ENCOUNTER — Inpatient Hospital Stay: Payer: Medicaid Other | Attending: Family

## 2020-12-23 ENCOUNTER — Inpatient Hospital Stay (HOSPITAL_BASED_OUTPATIENT_CLINIC_OR_DEPARTMENT_OTHER): Payer: Medicaid Other | Admitting: Family

## 2020-12-23 VITALS — BP 138/74 | HR 95 | Temp 98.2°F | Resp 18 | Ht 62.0 in | Wt 180.0 lb

## 2020-12-23 DIAGNOSIS — Z853 Personal history of malignant neoplasm of breast: Secondary | ICD-10-CM | POA: Diagnosis present

## 2020-12-23 DIAGNOSIS — G629 Polyneuropathy, unspecified: Secondary | ICD-10-CM | POA: Insufficient documentation

## 2020-12-23 DIAGNOSIS — F17210 Nicotine dependence, cigarettes, uncomplicated: Secondary | ICD-10-CM | POA: Diagnosis not present

## 2020-12-23 DIAGNOSIS — Z923 Personal history of irradiation: Secondary | ICD-10-CM | POA: Diagnosis not present

## 2020-12-23 DIAGNOSIS — Z9221 Personal history of antineoplastic chemotherapy: Secondary | ICD-10-CM | POA: Diagnosis not present

## 2020-12-23 DIAGNOSIS — N6459 Other signs and symptoms in breast: Secondary | ICD-10-CM

## 2020-12-23 DIAGNOSIS — Z171 Estrogen receptor negative status [ER-]: Secondary | ICD-10-CM | POA: Diagnosis not present

## 2020-12-23 DIAGNOSIS — C50212 Malignant neoplasm of upper-inner quadrant of left female breast: Secondary | ICD-10-CM

## 2020-12-23 DIAGNOSIS — I824Y1 Acute embolism and thrombosis of unspecified deep veins of right proximal lower extremity: Secondary | ICD-10-CM

## 2020-12-23 LAB — CMP (CANCER CENTER ONLY)
ALT: 11 U/L (ref 0–44)
AST: 10 U/L — ABNORMAL LOW (ref 15–41)
Albumin: 4.2 g/dL (ref 3.5–5.0)
Alkaline Phosphatase: 82 U/L (ref 38–126)
Anion gap: 7 (ref 5–15)
BUN: 12 mg/dL (ref 6–20)
CO2: 29 mmol/L (ref 22–32)
Calcium: 9.9 mg/dL (ref 8.9–10.3)
Chloride: 102 mmol/L (ref 98–111)
Creatinine: 0.83 mg/dL (ref 0.44–1.00)
GFR, Estimated: >60 mL/min (ref >60)
Glucose, Bld: 185 mg/dL — ABNORMAL HIGH (ref 70–99)
Potassium: 4.6 mmol/L (ref 3.5–5.1)
Sodium: 138 mmol/L (ref 135–145)
Total Bilirubin: 0.4 mg/dL (ref 0.3–1.2)
Total Protein: 7 g/dL (ref 6.5–8.1)

## 2020-12-23 LAB — CBC WITH DIFFERENTIAL (CANCER CENTER ONLY)
Abs Immature Granulocytes: 0.02 10*3/uL (ref 0.00–0.07)
Basophils Absolute: 0 10*3/uL (ref 0.0–0.1)
Basophils Relative: 1 %
Eosinophils Absolute: 0.1 10*3/uL (ref 0.0–0.5)
Eosinophils Relative: 1 %
HCT: 38.2 % (ref 36.0–46.0)
Hemoglobin: 13 g/dL (ref 12.0–15.0)
Immature Granulocytes: 0 %
Lymphocytes Relative: 24 %
Lymphs Abs: 1.5 10*3/uL (ref 0.7–4.0)
MCH: 30.1 pg (ref 26.0–34.0)
MCHC: 34 g/dL (ref 30.0–36.0)
MCV: 88.4 fL (ref 80.0–100.0)
Monocytes Absolute: 0.6 10*3/uL (ref 0.1–1.0)
Monocytes Relative: 9 %
Neutro Abs: 4.3 10*3/uL (ref 1.7–7.7)
Neutrophils Relative %: 65 %
Platelet Count: 311 10*3/uL (ref 150–400)
RBC: 4.32 MIL/uL (ref 3.87–5.11)
RDW: 13.8 % (ref 11.5–15.5)
WBC Count: 6.6 10*3/uL (ref 4.0–10.5)
nRBC: 0 % (ref 0.0–0.2)

## 2020-12-23 NOTE — Progress Notes (Addendum)
Hematology and Oncology Follow Up Visit  Ariana Herrera [id number] [birth year] 55 y.o. 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 Path: invasive ductal carcinoma, Grade 3, tumor 2.1cm, margins negative, sentinel LN negative; pT2,pN0 Late 07/2018 - 12/2018: adjuvant AC x 4 cycles, followed by weekly Taxol x 10cycles (d/c'eddue to neuropathy) 01/2019 - 02/2019: adjuvant RT  Late 02/2019: calcifications in the UOQ of left breast, 6cm from nipple, suspicious; bx'ed ? Path: atrophic breastparenchyma with stromal fibrosis; no atypical hyperplasia or malignancyOn surveillance  Current Therapy:        Observation   Interim History:  Ariana Herrera is here today for follow-up. She is doing well but still notes a little fluid build up around the left breast at time. She is interested in going back to PT for lymphatic massage. Bilateral breast exam negative. No mass, lesion or rash noted.  Mammogram due again later this month. Order is in and patient will schedule at [facility] breast center.  No adenopathy or lymphedema noted on exam.  No fever, chills, n/v, cough, rash, dizziness, SOB, chest pain, palpitations, abdominal pain or changes in bowel or bladder habits.  She is still smoking 1.5 ppd.  She did have pancreatitis as a side effect of Trulicity  No blood loss noted. No bruising or petechiae.  No swelling or tenderness in her extremities at this time.  She has neuropathy in her hands and feet that is stable/unchanged.  No syncope. She did trip and fall a month or so ago but thankfully states she was not injured.  ECOG Performance Status: 1 - Symptomatic but completely ambulatory  Medications:  Allergies as of 12/23/2020      Reactions   Lithium Other (See Comments)   Spinal fluid built up in  brain   Trulicity [dulaglutide] Nausea And Vomiting   Penicillins Other (See Comments)   UNKNOWN CHILDHOOD REACTION      Medication List       Accurate as of December 23, 2020  2:19 PM. If you have any questions, ask your nurse or doctor.        atorvastatin 40 MG tablet Commonly known as: LIPITOR Take 1 tablet (40 mg total) by mouth daily.   B-D UF III MINI PEN NEEDLES 31G X 5 MM Misc Generic drug: Insulin Pen Needle Use to administer insulin 4 times daily   fenofibrate 145 MG tablet Commonly known as: TRICOR Take 1 tablet (145 mg total) by mouth daily.   FreeStyle Libre 2 Sensor Misc Inject 1 sensor to the skin every 14 days for continuous glucose monitoring.   HYDROcodone-acetaminophen 5-325 MG tablet Commonly known as: NORCO/VICODIN Take 1 tablet by mouth in the morning, at noon, and at bedtime.   ibuprofen 800 MG tablet Commonly known as: ADVIL Take 800 mg by mouth 3 (three) times daily as needed.   insulin glargine 100 UNIT/ML injection Commonly known as: LANTUS Inject 25 Units into the skin at bedtime.   linaclotide 290 MCG Caps capsule Commonly known as: LINZESS Take 290 mcg by mouth daily as needed.   LORazepam 1 MG tablet Commonly known as: ATIVAN SMARTSIG:1 Tablet(s) Sublingual   losartan 25 MG tablet Commonly known as: COZAAR Take 1 tablet (25 mg total) by mouth daily.   metFORMIN 1000 MG tablet Commonly known as: GLUCOPHAGE Take 1,000 mg by  mouth 2 (two) times daily with a meal.   metoprolol succinate 25 MG 24 hr tablet Commonly known as: TOPROL-XL Take 1 tablet (25 mg total) by mouth daily.   nitroGLYCERIN 0.4 MG SL tablet Commonly known as: NITROSTAT Place 1 tablet (0.4 mg total) under the tongue every 5 (five) minutes as needed for chest pain.   NovoLOG FlexPen 100 UNIT/ML FlexPen Generic drug: insulin aspart Inject 3 Units into the skin 3 (three) times daily with meals.   omeprazole 20 MG capsule Commonly known as: PRILOSEC Take 20 mg  by mouth 2 (two) times daily.   QUEtiapine 400 MG tablet Commonly known as: SEROQUEL Take 800 mg by mouth at bedtime.       Allergies:  Allergies  Allergen Reactions  . Lithium Other (See Comments)    Spinal fluid built up in brain  . Trulicity [Dulaglutide] Nausea And Vomiting  . Penicillins Other (See Comments)    UNKNOWN CHILDHOOD REACTION    Past Medical History, Surgical history, Social history, and Family History were reviewed and updated.  Review of Systems: All other 10 point review of systems is negative.   Physical Exam:  height is 5' 2"  (1.575 m) and weight is 180 lb (81.6 kg). Her oral temperature is 98.2 F (36.8 C). Her blood pressure is 138/74 and her pulse is 95. Her respiration is 18 and oxygen saturation is 99%.   Wt Readings from Last 3 Encounters:  12/23/20 180 lb (81.6 kg)  12/05/20 162 lb (73.5 kg)  09/04/20 165 lb 6.4 oz (75 kg)    Ocular: Sclerae unicteric, pupils equal, round and reactive to light Ear-nose-throat: Oropharynx clear, dentition fair Lymphatic: No cervical, supraclavicular or axillary adenopathy Lungs no rales or rhonchi, good excursion bilaterally Heart regular rate and rhythm, no murmur appreciated Abd soft, nontender, positive bowel sounds, no liver or spleen tip palpated on exam, no fluid wave  MSK no focal spinal tenderness, no joint edema Neuro: non-focal, well-oriented, appropriate affect Breasts: Bilateral breast exam negative. No mass, lesion or rash noted.   Lab Results  Component Value Date   WBC 6.6 12/23/2020   HGB 13.0 12/23/2020   HCT 38.2 12/23/2020   MCV 88.4 12/23/2020   PLT 311 12/23/2020   No results found for: FERRITIN, IRON, TIBC, UIBC, IRONPCTSAT Lab Results  Component Value Date   RBC 4.32 12/23/2020   No results found for: KPAFRELGTCHN, LAMBDASER, KAPLAMBRATIO No results found for: IGGSERUM, IGA, IGMSERUM No results found for: Ariana Herrera   Chemistry      Component Value Date/Time   NA 137 08/25/2020 0954   K 5.4 (H) 08/25/2020 0954   CL 109 08/25/2020 0954   CO2 22 08/25/2020 0954   BUN <5 (L) 08/25/2020 0954   CREATININE 0.58 08/25/2020 0954   CREATININE 0.75 05/27/2020 1256      Component Value Date/Time   CALCIUM 8.5 (L) 08/25/2020 0954   ALKPHOS 60 08/23/2020 0558   AST 13 (L) 08/23/2020 0558   AST 13 (L) 05/27/2020 1256   ALT 15 08/23/2020 0558   ALT 14 05/27/2020 1256   BILITOT 0.8 08/23/2020 0558   BILITOT 0.3 05/27/2020 1256       Impression and Plan: Ariana Herrera is a very pleasant 55 yo caucasian female with history of stage IIA (pT2,pN0,cM0) IDC of the left breast, 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 She completed treatment (lumpectomy, chemo and radiation) in [place] in May 2020. She also developed andRLE DVT and  thrombus of her central line which she states was felt to be provoked. She completed 2 years of full dose anticoagulation in August 2021.  She continues to do well and so far there has been no evidence of recurrence.  Mammogram ordered for in 2-3 weeks.  Referral placed for PT/lymphedema clinic.  Follow-up in 6 months.  She was encouraged to contact our office with any questions or concerns.   Laverna Peace, NP 3/1/20222:19 PM

## 2020-12-23 NOTE — Telephone Encounter (Signed)
appts made and printed for pt per 12/23/20 los   [employer]

## 2021-01-09 ENCOUNTER — Telehealth: Payer: Self-pay | Admitting: Family

## 2021-01-09 NOTE — Telephone Encounter (Signed)
Appointments rescheduled due to provider pal.  Updated calendar & letter was mailed

## 2021-05-01 ENCOUNTER — Other Ambulatory Visit: Payer: Self-pay | Admitting: Family

## 2021-05-01 ENCOUNTER — Ambulatory Visit
Admission: RE | Admit: 2021-05-01 | Discharge: 2021-05-01 | Disposition: A | Discharge status: Home / Self Care | Payer: Medicaid Other | Source: Ambulatory Visit | Attending: Family | Admitting: Family

## 2021-05-01 ENCOUNTER — Other Ambulatory Visit: Payer: Self-pay

## 2021-05-01 DIAGNOSIS — C50212 Malignant neoplasm of upper-inner quadrant of left female breast: Secondary | ICD-10-CM

## 2021-05-01 DIAGNOSIS — N6459 Other signs and symptoms in breast: Secondary | ICD-10-CM

## 2021-05-01 DIAGNOSIS — N6489 Other specified disorders of breast: Secondary | ICD-10-CM

## 2021-05-01 DIAGNOSIS — Z171 Estrogen receptor negative status [ER-]: Secondary | ICD-10-CM

## 2021-05-08 ENCOUNTER — Ambulatory Visit
Admission: RE | Admit: 2021-05-08 | Discharge: 2021-05-08 | Disposition: A | Discharge status: Home / Self Care | Payer: Medicaid Other | Source: Ambulatory Visit | Attending: Family | Admitting: Family

## 2021-05-08 ENCOUNTER — Other Ambulatory Visit: Payer: Self-pay

## 2021-05-08 DIAGNOSIS — N6489 Other specified disorders of breast: Secondary | ICD-10-CM

## 2021-05-11 ENCOUNTER — Telehealth: Payer: Self-pay | Admitting: *Deleted

## 2021-05-11 NOTE — Telephone Encounter (Addendum)
-----   Message from Eliezer Bottom, NP sent at 05/11/2021  1:44 PM EDT ----- Called patient with message from Verneita Griffes NP. LMAM for patient to cal [place] back for this message. "No malignancy identified!!! [place]!!!!!!"  Sarah  ----- Message ----- From: Interface, Lab In [facility] Sent: 05/11/2021  11:07 AM EDT To: Eliezer Bottom, NP

## 2021-05-26 ENCOUNTER — Telehealth: Payer: Self-pay | Admitting: *Deleted

## 2021-05-26 NOTE — Telephone Encounter (Signed)
Called and lvm of rescheduling appointment with Judson Roch - requested call back to confirm - mailed updated calendar

## 2021-06-23 ENCOUNTER — Inpatient Hospital Stay: Payer: Medicaid Other

## 2021-06-23 ENCOUNTER — Inpatient Hospital Stay: Payer: Medicaid Other | Admitting: Family

## 2021-06-25 ENCOUNTER — Ambulatory Visit: Payer: Medicaid Other | Admitting: Family

## 2021-06-25 ENCOUNTER — Other Ambulatory Visit: Payer: Medicaid Other

## 2021-06-26 ENCOUNTER — Ambulatory Visit: Payer: Medicaid Other | Admitting: Family

## 2021-06-26 ENCOUNTER — Other Ambulatory Visit: Payer: Medicaid Other

## 2021-07-03 NOTE — Progress Notes (Signed)
Cardiology Clinic Note   Patient Name: [name] Date of Encounter: 07/06/2021  Primary Care Provider:  Dulce Sellar, MD Primary Cardiologist:  Pixie Casino, MD  Patient Profile    Angelis Herrera 55 year old female presents the clinic today for follow-up evaluation of her coronary artery disease and HLD.  Past Medical History    Past Medical History:  Diagnosis Date   Anginal pain ([place])    Asthma    Bipolar 1 disorder ([place])    Bipolar disease, chronic ([place])    Breast cancer in female [facility])    Coronary artery disease    Deep vein blood clot of right lower extremity ([place])    Diabetes mellitus type 2 in obese ([place])    Hyperlipidemia    MI (myocardial infarction) ([place])    was in [place]   Obesity    Personal history of chemotherapy    Personal history of radiation therapy    Past Surgical History:  Procedure Laterality Date   BREAST LUMPECTOMY Left 03/2019   IR REMOVAL TUN ACCESS W/ PORT W/O FL MOD SED  12/20/2019   LEFT HEART CATH AND CORONARY ANGIOGRAPHY N/A 08/22/2020   Procedure: LEFT HEART CATH AND CORONARY ANGIOGRAPHY;  Surgeon: Burnell Blanks, MD;  Location: [facility];  Service: Cardiovascular;  Laterality: N/A;    Allergies  Allergies  Allergen Reactions   Lithium Other (See Comments)    Spinal fluid built up in brain   Trulicity [Dulaglutide] Nausea And Vomiting   Penicillins Other (See Comments)    UNKNOWN CHILDHOOD REACTION    History of Present Illness    Ariana Herrera is a PMH of DVT right lower extremity, acute pancreatitis, type 2 diabetes chemotherapy-induced neuropathy, obesity, breast CA, bipolar disease, and NSTEMI.  She underwent cardiac catheterization 08/22/2020.  Her cardiac catheterization showed proximal RCA lesion 30%, mid RCA lesion 40%, mid-distal RCA lesion 20%, and OST LAD-proximal LAD 20%.  Medical management was recommended.   She presented to the hospital on 08/20/2020 and was discharged on 08/26/2020.   She awoke at night with severe abdominal pain.  She indicated that her pain increased intensity over a 2-hour time span when she dialed 911.  She reported nausea and vomiting.  She also reported severe substernal pain that radiated to her back.  The pain was decreased with pain medication.  She indicated that she had been in the emergency department for years prior with similar symptoms/chest discomfort and was told that it was GERD.  She was discharged without further interventions.  1-2 days after the incident she presented back to the ED underwent evaluation.  She was informed that she had a heart attack but a stent was not needed.  During this admission she underwent abdominal CT which was consistent with acute pancreatitis and fatty liver.  It was felt that this possibly secondary to Trulicity which was placed on hold.  She was changed to n.p.o. status and her pain improved.  She was discharged in stable condition and told to follow-up with cardiology and her PCP.   She presented to the clinic 09/04/2020 for follow-up evaluation stated she felt well today.  She  had no recurrent episodes of chest discomfort.  We reviewed her cardiac catheterization results.  She stated she started smoking again when she left the hospital but plans to quit on December 1.  She also had nicotine patches which she was planning to use.  She reported controlled blood pressure at home.  We reviewed her lipid panel and reviewed her cardiac catheterization.  She expressed understanding. I  gave her the salty 6 diet sheet, had her increase her physical activity as tolerated and follow-up in 6 months.   She presented to the clinic 12/05/20 for follow-up evaluation stated  she felt well.  She had not had any further episodes of chest discomfort or increased shortness of breath.  She remained physically active doing indoor activities and chores around her house.  She was tolerating cancer treatment well and had not had any trouble with  her medications.  She reported that she was following a low-sodium diet.  She reported she had both COVID-19 vaccinations and was going to review getting a booster with her oncologist on Monday.  I gave her the salty 6 diet sheet, had her continue to increase her physical activity as tolerated and planned follow-up in 6 months.  She presents to the clinic today for follow up evaluation and states she noticed swelling in her feet and hands last week.  This week she reports that her feet and hands have returned to normal.  She went to her PCP who prescribed pravastatin.  She reports that she was already taking atorvastatin.  I explained that these medications are in the same class and that she does not need to take pravastatin.  We will discontinue this.  She also notices that her heart rate occasionally goes into the 100s.  On exam today it is then normal 90s.  Blood pressure initially is 140/78 and on recheck is 136/78.  She also reports that she has been gaining weight.  She reports that she has been cooking most of her meals at home but uses salt type seasonings in the food.  I will increase her metoprolol to 37.5.,  Give high-fiber diet as well as salty 6 diet instructions.   I have asked her to increase her physical activity and we will repeat her fasting lipids and LFTs next week.  We plan follow-up for 6 months.   Today she denies chest pain, shortness of breath, lower extremity edema, fatigue, palpitations, melena, hematuria, hemoptysis, diaphoresis, weakness, presyncope, syncope, orthopnea, and PND.  Home Medications    Prior to Admission medications   Medication Sig Start Date End Date Taking? Authorizing Provider  atorvastatin (LIPITOR) 40 MG tablet Take 1 tablet (40 mg total) by mouth daily. 12/05/20   Deberah Pelton, NP  Continuous Blood Gluc Sensor (FREESTYLE LIBRE 2 SENSOR) MISC Inject 1 sensor to the skin every 14 days for continuous glucose monitoring. 12/09/20   [provider]   fenofibrate (TRICOR) 145 MG tablet Take 1 tablet (145 mg total) by mouth daily. 12/05/20   Deberah Pelton, NP  HYDROcodone-acetaminophen (NORCO/VICODIN) 5-325 MG tablet Take 1 tablet by mouth in the morning, at noon, and at bedtime.  11/26/19   [provider]  ibuprofen (ADVIL) 800 MG tablet Take 800 mg by mouth 3 (three) times daily as needed. 09/24/20   [provider]  insulin aspart (NOVOLOG FLEXPEN) 100 UNIT/ML FlexPen Inject 3 Units into the skin 3 (three) times daily with meals. 08/25/20   Debbe Odea, MD  insulin glargine (LANTUS) 100 UNIT/ML injection Inject 25 Units into the skin at bedtime.    [provider]  Insulin Pen Needle (B-D UF III MINI PEN NEEDLES) 31G X 5 MM MISC Use to administer insulin 4 times daily 11/07/20   [provider]  linaclotide (LINZESS) 290 MCG CAPS capsule Take 290  mcg by mouth daily as needed.    [provider]  LORazepam (ATIVAN) 1 MG tablet SMARTSIG:1 Tablet(s) Sublingual 09/08/20   [provider]  losartan (COZAAR) 25 MG tablet Take 1 tablet (25 mg total) by mouth daily. 08/26/20   Debbe Odea, MD  metFORMIN (GLUCOPHAGE) 1000 MG tablet Take 1,000 mg by mouth 2 (two) times daily with a meal. 11/26/19   [provider]  metoprolol succinate (TOPROL-XL) 25 MG 24 hr tablet Take 1 tablet (25 mg total) by mouth daily. 08/26/20   Debbe Odea, MD  nitroGLYCERIN (NITROSTAT) 0.4 MG SL tablet Place 1 tablet (0.4 mg total) under the tongue every 5 (five) minutes as needed for chest pain. 09/04/20   Deberah Pelton, NP  omeprazole (PRILOSEC) 20 MG capsule Take 20 mg by mouth 2 (two) times daily. 10/10/20   [provider]  QUEtiapine (SEROQUEL) 400 MG tablet Take 800 mg by mouth at bedtime.    [provider]    Family History    Family History  Problem Relation Age of Onset   Heart attack Mother 33   She indicated that her mother is alive.  Social History    Social History    Socioeconomic History   Marital status: Unknown    Spouse name: Not on file   Number of children: Not on file   Years of education: Not on file   Highest education level: Not on file  Occupational History   Occupation: disabled due to neuropathy and cancer treatments  Tobacco Use   Smoking status: Every Day    Packs/day: 2.00    Years: 36.00    Pack years: 72.00    Types: Cigarettes    Start date: 11/26/2019   Smokeless tobacco: Never  Vaping Use   Vaping Use: Never used  Substance and Sexual Activity   Alcohol use: Never   Drug use: Yes    Types: Marijuana    Comment: occasionally   Sexual activity: Not on file  Other Topics Concern   Not on file  Social History Narrative   Not on file   Social Determinants of Health   Financial Resource Strain: Not on file  Food Insecurity: Not on file  Transportation Needs: Not on file  Physical Activity: Not on file  Stress: Not on file  Social Connections: Not on file  Intimate Partner Violence: Not on file     Review of Systems    General:  No chills, fever, night sweats or weight changes.  Cardiovascular:  No chest pain, dyspnea on exertion, edema, orthopnea, palpitations, paroxysmal nocturnal dyspnea. Dermatological: No rash, lesions/masses Respiratory: No cough, dyspnea Urologic: No hematuria, dysuria Abdominal:   No nausea, vomiting, diarrhea, bright red blood per rectum, melena, or hematemesis Neurologic:  No visual changes, wkns, changes in mental status. All other systems reviewed and are otherwise negative except as noted above.  Physical Exam    VS:  BP 136/78 (BP Location: Right Arm)   Pulse 90   Ht '5\' 2"'$  (1.575 m)   Wt 186 lb 12.8 oz (84.7 kg)   SpO2 97%   BMI 34.17 kg/m  , BMI Body mass index is 34.17 kg/m. GEN: Well nourished, well developed, in no acute distress. HEENT: normal. Neck: Supple, no JVD, carotid bruits, or masses. Cardiac: RRR, no murmurs, rubs, or gallops. No clubbing, cyanosis, edema.   Radials/DP/PT 2+ and equal bilaterally.  Respiratory:  Respirations regular and unlabored, clear to auscultation bilaterally. GI: Soft, nontender, nondistended,  BS + x 4. MS: no deformity or atrophy. Skin: warm and dry, no rash. Neuro:  Strength and sensation are intact. Psych: Normal affect.  Accessory Clinical Findings    Recent Labs: 08/20/2020: TSH 1.011 08/25/2020: Magnesium 1.2 12/23/2020: ALT 11; BUN 12; Creatinine 0.83; Hemoglobin 13.0; Platelet Count 311; Potassium 4.6; Sodium 138   Recent Lipid Panel    Component Value Date/Time   CHOL 135 08/21/2020 0558   TRIG 227 (H) 08/21/2020 0558   HDL 29 (L) 08/21/2020 0558   CHOLHDL 4.7 08/21/2020 0558   VLDL 45 (H) 08/21/2020 0558   LDLCALC 61 08/21/2020 0558    ECG personally reviewed by me today-none today.  CT angio chest/abdomen   IMPRESSION: Vascular findings:   1. Negative for aortic aneurysm or dissection. 2. Small wedge-shaped areas of decreased enhancement within the inferior poles of the right kidney and left kidney, concerning for areas of focal renal infarction. Pyelonephritis could have a similar appearance. Correlation with urinalysis is recommended. 3. Focal 11 x 5 mm area of noncalcified atherosclerotic plaque with a slightly polypoid configuration projecting into the lumen of the distal descending thoracic aorta. This may be at risk for potential plaque thrombosis.   Nonvascular findings:   1. Findings of acute uncomplicated pancreatitis. No evidence of pancreatic necrosis or peripancreatic fluid collection. Correlate with serum lipase. 2. Findings suggestive of hepatic steatosis. 3. Indeterminate 1.8 cm rounded area of hypoattenuation within the anterior aspect of the spleen. Further evaluation with contrast-enhanced MRI can be performed on a nonemergent basis. 4. Slightly mottled appearance of the left third rib anteriorly with some adjacent scarring in the adjacent lung,  potentially representing post radiation changes. 5. Left breast skin thickening with suspected lumpectomy site at the inner left breast. Continued mammographic follow-up is recommended.     Echocardiogram 08/13/2020 IMPRESSIONS     1. Left ventricular ejection fraction, by estimation, is 60 to 65%. The  left ventricle has normal function. The left ventricle has no regional  wall motion abnormalities. Left ventricular diastolic parameters were  normal.   2. Right ventricular systolic function is normal. The right ventricular  size is normal. Tricuspid regurgitation signal is inadequate for assessing  PA pressure.   3. The mitral valve is normal in structure. Trivial mitral valve  regurgitation. No evidence of mitral stenosis.   4. The aortic valve is tricuspid. Aortic valve regurgitation is not  visualized. No aortic stenosis is present.   5. The inferior vena cava is normal in size with greater than 50%  respiratory variability, suggesting right atrial pressure of 3 mmHg.   Cardiac catheterization 08/22/2020   Prox RCA lesion is 30% stenosed. Mid RCA lesion is 40% stenosed. Mid RCA to Dist RCA lesion is 20% stenosed. Ost LAD to Prox LAD lesion is 20% stenosed.   1. Mild non-obstructive CAD 2. Elevated left filling pressures.    Recommendations: Medical management of mild CAD     Diagnostic Dominance: Right Intervention  Assessment & Plan   1.  Nonobstructive CAD -denies recent episodes of chest discomfort. Underwent cardiac catheterization 08/22/2020 which showed nonobstructive CAD.  Medical management was recommended. Continue atorvastatin, metoprolol, losartan, fenofibrate Heart healthy low-sodium diet-salty 6 given Increase physical activity as tolerated   Hyperlipidemia-08/21/2020: Cholesterol 135; HDL 29; LDL Cholesterol 61; Triglycerides 227; VLDL 45 Continue atorvastatin, fenofibrate Heart healthy low-sodium high-fiber diet.   Increase physical activity as  tolerated  Repeat fasting lipids and LFT's  Essential hypertension-BP today 136/78.  Currently well controlled at home.  Continue metoprolol, losartan Heart healthy low-sodium diet-salty 6 given Increase physical activity as tolerated   Acute pancreatitis-continues to deny episodes of abdominal pain.  Had CT abdomen 08/20/2020 which showed acute pancreatitis and fatty liver disease.  Negative for aortic aneurysm or dissection.  No history of EtOH abuse.  Details above.  Pain previously improved with making her n.p.o.  It was felt that her Trulicity may have been a contributing factor. Follow-up with PCP   Disposition: Follow-up with Dr. Debara Pickett  in 6 months.   Jossie Ng. Aydn Ferrara NP-C    07/06/2021, 11:58 AM [address] Office [phone] Fax [phone]  Notice: This dictation was prepared with Dragon dictation along with smaller phrase technology. Any transcriptional errors that result from this process are unintentional and may not be corrected upon review.  I spent 13 minutes examining this patient, reviewing medications, and using patient centered shared decision making involving her cardiac care.  Prior to her visit I spent greater than 20 minutes reviewing her past medical history,  medications, and prior cardiac tests.

## 2021-07-06 ENCOUNTER — Encounter: Payer: Self-pay | Admitting: General Practice

## 2021-07-06 ENCOUNTER — Other Ambulatory Visit: Payer: Self-pay

## 2021-07-06 ENCOUNTER — Ambulatory Visit (INDEPENDENT_AMBULATORY_CARE_PROVIDER_SITE_OTHER): Payer: Medicaid Other | Admitting: General Practice

## 2021-07-06 VITALS — BP 136/78 | HR 90 | Ht 62.0 in | Wt 186.8 lb

## 2021-07-06 DIAGNOSIS — I10 Essential (primary) hypertension: Secondary | ICD-10-CM

## 2021-07-06 DIAGNOSIS — Z79899 Other long term (current) drug therapy: Secondary | ICD-10-CM

## 2021-07-06 DIAGNOSIS — E782 Mixed hyperlipidemia: Secondary | ICD-10-CM | POA: Diagnosis not present

## 2021-07-06 DIAGNOSIS — K858 Other acute pancreatitis without necrosis or infection: Secondary | ICD-10-CM

## 2021-07-06 DIAGNOSIS — I2510 Atherosclerotic heart disease of native coronary artery without angina pectoris: Secondary | ICD-10-CM

## 2021-07-06 MED ORDER — METOPROLOL SUCCINATE ER 25 MG PO TB24: 37.5000 mg | ORAL_TABLET | Freq: Every day | ORAL | 0 refills | Status: DC

## 2021-07-06 NOTE — Patient Instructions (Signed)
Medication Instructions:  STOP PRAVASTATIN  INCREASE METOPROLOL 37.'5MG'$  DAILY (1 & 1/2 TAB DAILY  *If you need a refill on your cardiac medications before your next appointment, please call your pharmacy*  Lab Work: FASTING LIPID AND LFT NEXT WEEK If you have labs (blood work) drawn today and your tests are completely normal, you will receive your results only by:  [place] (if you have MyChart) OR A paper copy in the mail.  If you have any lab test that is abnormal or we need to change your treatment, we will call you to review the results. You may go to any [facility] that is convenient for you however, we do have a lab in our office that is able to assist you. You DO NOT need an appointment for our lab. The lab is open 8:00am and closes at 4:00pm. Lunch 12:45 - 1:45pm.  Special Instructions PLEASE READ AND FOLLOW INCREASED FIBER DIET-ATTACHED  PLEASE READ AND FOLLOW SALTY 6-ATTACHED-1,800 mg daily  PLEASE START EXERCISING AS TOLERATED  Follow-Up: Your next appointment:  6 month(s) In Person with K. Mali Hilty, MD   At [facility], you and your health needs are our priority.  As part of our continuing mission to provide you with exceptional heart care, we have created designated Provider Care Teams.  These Care Teams include your primary Cardiologist (physician) and Advanced Practice Providers (APPs -  Physician Assistants and Nurse Practitioners) who all work together to provide you with the care you need, when you need it.  We recommend signing up for the patient portal called "MyChart".  Sign up information is provided on this After Visit Summary.  MyChart is used to connect with patients for Virtual Visits (Telemedicine).  Patients are able to view lab/test results, encounter notes, upcoming appointments, etc.  Non-urgent messages can be sent to your provider as well.   To learn more about what you can do with MyChart, go to [email or web address].

## 2021-07-13 LAB — LIPID PANEL
Chol/HDL Ratio: 3.7 ratio (ref 0.0–4.4)
Cholesterol, Total: 128 mg/dL (ref 100–199)
HDL: 35 mg/dL — ABNORMAL LOW (ref >39)
LDL Chol Calc (NIH): 68 mg/dL (ref 0–99)
Triglycerides: 145 mg/dL (ref 0–149)
VLDL Cholesterol Cal: 25 mg/dL (ref 5–40)

## 2021-07-13 LAB — HEPATIC FUNCTION PANEL
ALT: 17 IU/L (ref 0–32)
AST: 13 IU/L (ref 0–40)
Albumin: 4.5 g/dL (ref 3.8–4.9)
Alkaline Phosphatase: 66 IU/L (ref 44–121)
Bilirubin Total: 0.3 mg/dL (ref 0.0–1.2)
Bilirubin, Direct: <0.1 mg/dL (ref 0.00–0.40)
Total Protein: 6.9 g/dL (ref 6.0–8.5)

## 2021-07-29 ENCOUNTER — Inpatient Hospital Stay: Payer: Medicaid Other | Attending: Hematology & Oncology

## 2021-07-29 ENCOUNTER — Inpatient Hospital Stay (HOSPITAL_BASED_OUTPATIENT_CLINIC_OR_DEPARTMENT_OTHER): Payer: Medicaid Other | Admitting: Family

## 2021-07-29 ENCOUNTER — Encounter: Payer: Self-pay | Admitting: Family

## 2021-07-29 ENCOUNTER — Other Ambulatory Visit: Payer: Self-pay

## 2021-07-29 VITALS — BP 136/65 | HR 99 | Temp 98.9°F | Resp 18 | Wt 191.0 lb

## 2021-07-29 DIAGNOSIS — Z9221 Personal history of antineoplastic chemotherapy: Secondary | ICD-10-CM | POA: Insufficient documentation

## 2021-07-29 DIAGNOSIS — N6459 Other signs and symptoms in breast: Secondary | ICD-10-CM

## 2021-07-29 DIAGNOSIS — C50212 Malignant neoplasm of upper-inner quadrant of left female breast: Secondary | ICD-10-CM

## 2021-07-29 DIAGNOSIS — Z171 Estrogen receptor negative status [ER-]: Secondary | ICD-10-CM

## 2021-07-29 DIAGNOSIS — Z853 Personal history of malignant neoplasm of breast: Secondary | ICD-10-CM | POA: Diagnosis not present

## 2021-07-29 DIAGNOSIS — I824Y1 Acute embolism and thrombosis of unspecified deep veins of right proximal lower extremity: Secondary | ICD-10-CM

## 2021-07-29 DIAGNOSIS — Z923 Personal history of irradiation: Secondary | ICD-10-CM | POA: Insufficient documentation

## 2021-07-29 LAB — CBC WITH DIFFERENTIAL (CANCER CENTER ONLY)
Abs Immature Granulocytes: 0.04 10*3/uL (ref 0.00–0.07)
Basophils Absolute: 0 10*3/uL (ref 0.0–0.1)
Basophils Relative: 1 %
Eosinophils Absolute: 0.1 10*3/uL (ref 0.0–0.5)
Eosinophils Relative: 1 %
HCT: 37.6 % (ref 36.0–46.0)
Hemoglobin: 12.8 g/dL (ref 12.0–15.0)
Immature Granulocytes: 1 %
Lymphocytes Relative: 22 %
Lymphs Abs: 1.9 10*3/uL (ref 0.7–4.0)
MCH: 29.5 pg (ref 26.0–34.0)
MCHC: 34 g/dL (ref 30.0–36.0)
MCV: 86.6 fL (ref 80.0–100.0)
Monocytes Absolute: 0.6 10*3/uL (ref 0.1–1.0)
Monocytes Relative: 6 %
Neutro Abs: 6.1 10*3/uL (ref 1.7–7.7)
Neutrophils Relative %: 69 %
Platelet Count: 422 10*3/uL — ABNORMAL HIGH (ref 150–400)
RBC: 4.34 MIL/uL (ref 3.87–5.11)
RDW: 14 % (ref 11.5–15.5)
WBC Count: 8.7 10*3/uL (ref 4.0–10.5)
nRBC: 0 % (ref 0.0–0.2)

## 2021-07-29 LAB — CMP (CANCER CENTER ONLY)
ALT: 13 U/L (ref 0–44)
AST: 12 U/L — ABNORMAL LOW (ref 15–41)
Albumin: 4.2 g/dL (ref 3.5–5.0)
Alkaline Phosphatase: 45 U/L (ref 38–126)
Anion gap: 11 (ref 5–15)
BUN: 22 mg/dL — ABNORMAL HIGH (ref 6–20)
CO2: 21 mmol/L — ABNORMAL LOW (ref 22–32)
Calcium: 9.5 mg/dL (ref 8.9–10.3)
Chloride: 104 mmol/L (ref 98–111)
Creatinine: 0.92 mg/dL (ref 0.44–1.00)
GFR, Estimated: >60 mL/min (ref >60)
Glucose, Bld: 240 mg/dL — ABNORMAL HIGH (ref 70–99)
Potassium: 4.3 mmol/L (ref 3.5–5.1)
Sodium: 136 mmol/L (ref 135–145)
Total Bilirubin: 0.3 mg/dL (ref 0.3–1.2)
Total Protein: 7 g/dL (ref 6.5–8.1)

## 2021-07-29 NOTE — Progress Notes (Signed)
Hematology and Oncology Follow Up Visit  Ariana Herrera [id number] 23-Jan-1966 55 y.o. 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 - Treatment completed in [place] DVT - resolved on [place] 11/29/2019   Past Therapy: 06/2018: left lumpectomy with SLN biopsy Path: invasive ductal carcinoma, Grade 3, tumor 2.1cm, margins negative, sentinel LN negative; pT2,pN0 Late 07/2018 - 12/2018: adjuvant AC x 4 cycles, followed by weekly Taxol x 10 cycles (d/c'ed due to neuropathy) 01/2019 - 02/2019: adjuvant RT  Late 02/2019: calcifications in the UOQ of left breast, 6cm from nipple, suspicious; bx'ed Path: atrophic breast parenchyma with stromal fibrosis; no atypical hyperplasia or malignancy On surveillance    Current Therapy:        Observation   Interim History:  Ariana Herrera is here today for follow-up. She is doing well and has no complaints at this time.  She had a left breast biopsy in July that showed periductal fibrosis, no malignancy. She will have a repeat mammogram in January 2023.  Breast exam today was negative. No mass, lesion or mass, noted. She had some fluid buildup in the left breast that is unchanged from baseline. She has history of lymphedema.  No adenopathy noted.  No swelling in her extremities at this time.  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  No falls or syncope to report.  She has maintained a good appetite and is staying well hydrated. Her weight is stable at 191 lbs. No fever, chills, n/v, cough, rash, dizziness, chest pain, palpitations, abdominal pain or changes in bowel or bladder habits.  She has mild SOB with over exertion and will take a break to rest when needed. She is smoking 1 ppd.   ECOG Performance Status: 1 - Symptomatic but completely ambulatory  Medications:  Allergies as of 07/29/2021       Reactions   Lithium Other (See Comments)   Spinal fluid built up in brain    Trulicity [dulaglutide] Nausea And Vomiting   Penicillins Other (See Comments)   UNKNOWN CHILDHOOD REACTION        Medication List        Accurate as of July 29, 2021 12:39 PM. If you have any questions, ask your nurse or doctor.          atorvastatin 40 MG tablet Commonly known as: LIPITOR Take 1 tablet (40 mg total) by mouth daily.   B-D UF III MINI PEN NEEDLES 31G X 5 MM Misc Generic drug: Insulin Pen Needle Use to administer insulin 4 times daily   fenofibrate 145 MG tablet Commonly known as: TRICOR Take 1 tablet (145 mg total) by mouth daily.   FreeStyle Libre 2 Sensor Misc Inject 1 sensor to the skin every 14 days for continuous glucose monitoring.   HYDROcodone-acetaminophen 5-325 MG tablet Commonly known as: NORCO/VICODIN Take 1 tablet by mouth in the morning, at noon, and at bedtime.   ibuprofen 800 MG tablet Commonly known as: ADVIL Take 800 mg by mouth 3 (three) times daily as needed.   insulin glargine 100 UNIT/ML injection Commonly known as: LANTUS Inject 25 Units into the skin at bedtime.   linaclotide 290 MCG Caps capsule Commonly known as: LINZESS Take 290 mcg by mouth daily as needed.   LORazepam 1 MG tablet Commonly known as: ATIVAN SMARTSIG:1 Tablet(s) Sublingual   losartan 25 MG tablet Commonly known as: COZAAR Take 1 tablet (25 mg total) by mouth daily.  metFORMIN 1000 MG tablet Commonly known as: GLUCOPHAGE Take 1,000 mg by mouth 2 (two) times daily with a meal.   metoprolol succinate 25 MG 24 hr tablet Commonly known as: TOPROL-XL Take 1.5 tablets (37.5 mg total) by mouth daily.   nitroGLYCERIN 0.4 MG SL tablet Commonly known as: NITROSTAT Place 1 tablet (0.4 mg total) under the tongue every 5 (five) minutes as needed for chest pain.   NovoLOG FlexPen 100 UNIT/ML FlexPen Generic drug: insulin aspart Inject 3 Units into the skin 3 (three) times daily with meals.   omeprazole 20 MG capsule Commonly known as:  PRILOSEC Take 20 mg by mouth 2 (two) times daily.   QUEtiapine 400 MG tablet Commonly known as: SEROQUEL Take 800 mg by mouth at bedtime.        Allergies:  Allergies  Allergen Reactions   Lithium Other (See Comments)    Spinal fluid built up in brain   Trulicity [Dulaglutide] Nausea And Vomiting   Penicillins Other (See Comments)    UNKNOWN CHILDHOOD REACTION    Past Medical History, Surgical history, Social history, and Family History were reviewed and updated.  Review of Systems: All other 10 point review of systems is negative.   Physical Exam:  vitals were not taken for this visit.   Wt Readings from Last 3 Encounters:  07/06/21 186 lb 12.8 oz (84.7 kg)  12/23/20 180 lb (81.6 kg)  12/05/20 162 lb (73.5 kg)    Ocular: Sclerae unicteric, pupils equal, round and reactive to light Ear-nose-throat: Oropharynx clear, dentition fair Lymphatic: No cervical, supraclavicular or axillary adenopathy Lungs no rales or rhonchi, good excursion bilaterally Heart regular rate and rhythm, no murmur appreciated Abd soft, nontender, positive bowel sounds MSK no focal spinal tenderness, no joint edema Neuro: non-focal, well-oriented, appropriate affect Breasts: No changes. No mass, lesion or rash noted.   Lab Results  Component Value Date   WBC 8.7 07/29/2021   HGB 12.8 07/29/2021   HCT 37.6 07/29/2021   MCV 86.6 07/29/2021   PLT 422 (H) 07/29/2021   No results found for: FERRITIN, IRON, TIBC, UIBC, IRONPCTSAT Lab Results  Component Value Date   RBC 4.34 07/29/2021   No results found for: KPAFRELGTCHN, LAMBDASER, KAPLAMBRATIO No results found for: IGGSERUM, IGA, IGMSERUM No results found for: Odetta Pink, SPEI   Chemistry      Component Value Date/Time   NA 138 12/23/2020 1347   K 4.6 12/23/2020 1347   CL 102 12/23/2020 1347   CO2 29 12/23/2020 1347   BUN 12 12/23/2020 1347   CREATININE 0.83 12/23/2020 1347       Component Value Date/Time   CALCIUM 9.9 12/23/2020 1347   ALKPHOS 66 07/13/2021 0831   AST 13 07/13/2021 0831   AST 10 (L) 12/23/2020 1347   ALT 17 07/13/2021 0831   ALT 11 12/23/2020 1347   BILITOT 0.3 07/13/2021 0831   BILITOT 0.4 12/23/2020 1347       Impression and Plan: Ariana Herrera is a very pleasant 55 yo caucasian female with history of stage IIA (pT2,pN0,cM0) IDC of the left breast, 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 She completed treatment (lumpectomy, chemo and radiation) in [place] in May 2020. She also developed and RLE DVT and thrombus of her central line which was felt to be provoked. She completed 2 years of full dose anticoagulation in August 2021.  She continues to do well and has no complaints at this time.  So far, there has been no  evidence of recurrence. Mammogram due again in January 2023.  Follow-up in 6 months.  She can contact our office with any questions or concerns.   Lottie Dawson, NP 10/5/202212:39 PM

## 2021-08-27 DIAGNOSIS — M65332 Trigger finger, left middle finger: Secondary | ICD-10-CM | POA: Insufficient documentation

## 2021-08-29 ENCOUNTER — Other Ambulatory Visit: Payer: Self-pay | Admitting: General Practice

## 2021-09-22 ENCOUNTER — Other Ambulatory Visit: Payer: Self-pay | Admitting: General Practice

## 2021-09-23 DIAGNOSIS — Z4789 Encounter for other orthopedic aftercare: Secondary | ICD-10-CM | POA: Insufficient documentation

## 2021-09-28 ENCOUNTER — Other Ambulatory Visit: Payer: Self-pay | Admitting: Adult Medicine

## 2021-09-28 DIAGNOSIS — Z9889 Other specified postprocedural states: Secondary | ICD-10-CM

## 2021-10-05 ENCOUNTER — Inpatient Hospital Stay: Payer: Medicaid Other | Attending: Hematology & Oncology

## 2021-10-05 ENCOUNTER — Other Ambulatory Visit: Payer: Self-pay

## 2021-10-05 ENCOUNTER — Inpatient Hospital Stay (HOSPITAL_BASED_OUTPATIENT_CLINIC_OR_DEPARTMENT_OTHER): Payer: Medicaid Other | Admitting: Family

## 2021-10-05 ENCOUNTER — Inpatient Hospital Stay: Payer: Medicaid Other

## 2021-10-05 ENCOUNTER — Encounter: Payer: Self-pay | Admitting: Family

## 2021-10-05 VITALS — BP 153/71 | HR 67 | Resp 17 | Ht 62.0 in | Wt 196.0 lb

## 2021-10-05 DIAGNOSIS — C50919 Malignant neoplasm of unspecified site of unspecified female breast: Secondary | ICD-10-CM | POA: Diagnosis not present

## 2021-10-05 DIAGNOSIS — C50212 Malignant neoplasm of upper-inner quadrant of left female breast: Secondary | ICD-10-CM

## 2021-10-05 DIAGNOSIS — Z9221 Personal history of antineoplastic chemotherapy: Secondary | ICD-10-CM | POA: Diagnosis not present

## 2021-10-05 DIAGNOSIS — Z171 Estrogen receptor negative status [ER-]: Secondary | ICD-10-CM

## 2021-10-05 DIAGNOSIS — I824Y1 Acute embolism and thrombosis of unspecified deep veins of right proximal lower extremity: Secondary | ICD-10-CM

## 2021-10-05 DIAGNOSIS — R222 Localized swelling, mass and lump, trunk: Secondary | ICD-10-CM

## 2021-10-05 DIAGNOSIS — Z853 Personal history of malignant neoplasm of breast: Secondary | ICD-10-CM | POA: Insufficient documentation

## 2021-10-05 DIAGNOSIS — Z923 Personal history of irradiation: Secondary | ICD-10-CM | POA: Insufficient documentation

## 2021-10-05 DIAGNOSIS — R221 Localized swelling, mass and lump, neck: Secondary | ICD-10-CM | POA: Insufficient documentation

## 2021-10-05 LAB — CMP (CANCER CENTER ONLY)
ALT: 21 U/L (ref 0–44)
AST: 14 U/L — ABNORMAL LOW (ref 15–41)
Albumin: 4.3 g/dL (ref 3.5–5.0)
Alkaline Phosphatase: 70 U/L (ref 38–126)
Anion gap: 8 (ref 5–15)
BUN: 11 mg/dL (ref 6–20)
CO2: 27 mmol/L (ref 22–32)
Calcium: 9.7 mg/dL (ref 8.9–10.3)
Chloride: 103 mmol/L (ref 98–111)
Creatinine: 0.81 mg/dL (ref 0.44–1.00)
GFR, Estimated: >60 mL/min (ref >60)
Glucose, Bld: 164 mg/dL — ABNORMAL HIGH (ref 70–99)
Potassium: 4.2 mmol/L (ref 3.5–5.1)
Sodium: 138 mmol/L (ref 135–145)
Total Bilirubin: 0.3 mg/dL (ref 0.3–1.2)
Total Protein: 6.8 g/dL (ref 6.5–8.1)

## 2021-10-05 LAB — CBC WITH DIFFERENTIAL (CANCER CENTER ONLY)
Abs Immature Granulocytes: 0.04 10*3/uL (ref 0.00–0.07)
Basophils Absolute: 0 10*3/uL (ref 0.0–0.1)
Basophils Relative: 1 %
Eosinophils Absolute: 0.1 10*3/uL (ref 0.0–0.5)
Eosinophils Relative: 2 %
HCT: 37.9 % (ref 36.0–46.0)
Hemoglobin: 12.8 g/dL (ref 12.0–15.0)
Immature Granulocytes: 1 %
Lymphocytes Relative: 27 %
Lymphs Abs: 2 10*3/uL (ref 0.7–4.0)
MCH: 29.2 pg (ref 26.0–34.0)
MCHC: 33.8 g/dL (ref 30.0–36.0)
MCV: 86.3 fL (ref 80.0–100.0)
Monocytes Absolute: 0.6 10*3/uL (ref 0.1–1.0)
Monocytes Relative: 8 %
Neutro Abs: 4.6 10*3/uL (ref 1.7–7.7)
Neutrophils Relative %: 61 %
Platelet Count: 391 10*3/uL (ref 150–400)
RBC: 4.39 MIL/uL (ref 3.87–5.11)
RDW: 14.3 % (ref 11.5–15.5)
WBC Count: 7.4 10*3/uL (ref 4.0–10.5)
nRBC: 0 % (ref 0.0–0.2)

## 2021-10-05 NOTE — Progress Notes (Signed)
Hematology and Oncology Follow Up Visit  Ariana Herrera [id number] [birth year] 55 y.o. 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 - Treatment completed in [place]    RLE DVT - resolved on [place] 11/29/2019   Past Therapy: 06/2018: left lumpectomy with SLN biopsy Path: invasive ductal carcinoma, Grade 3, tumor 2.1cm, margins negative, sentinel LN negative; pT2,pN0 Late 07/2018 - 12/2018: adjuvant AC x 4 cycles, followed by weekly Taxol x 10 cycles (d/c'ed due to neuropathy) 01/2019 - 02/2019: adjuvant RT  Late 02/2019: calcifications in the UOQ of left breast, 6cm from nipple, suspicious; bx'ed Path: atrophic breast parenchyma with stromal fibrosis; no atypical hyperplasia or malignancy On surveillance    Current Therapy:        Observation   Interim History:  Ariana Herrera is here today with her friend Thurmond Butts for early visit. She states that she recently noted a  that significantly increased in size within 3 weeks. She went to see her PCP and states that scans showed possible metastatic mass. She has requested a disc with her imaging from her PCP and will deliver to [facility] radiology department.  Today mass measures approximately 6 in length and 4 in width. She has a palpable nickel sized posterior cervical node above the mass. No axillary adenopathy noted on exam.  She is symptomatic with left neck tenderness and tenderness at the site with pressure.  No axillary adenopathy noted.  No lymphedema noted at this time.  No fever, chills, n/v, cough, rash, dizziness, SOB, chest pain, palpitations, abdominal pain or changes in bowel or bladder habits at this time.  No blood loss noted. No abnormal bruising, no petechiae.  No swelling in her extremities  Neuropathy in the hands and feet unchanged from baseline.  No falls or syncope to report.  She states that she has a good appetite and she is doing her best to stay well hydrated. Her  weight is stable at 196 lbs.   ECOG Performance Status: 1 - Symptomatic but completely ambulatory  Medications:  Allergies as of 10/05/2021       Reactions   Lithium Other (See Comments)   Spinal fluid built up in brain   Trulicity [dulaglutide] Nausea And Vomiting   Penicillins Other (See Comments)   UNKNOWN CHILDHOOD REACTION        Medication List        Accurate as of October 05, 2021  1:09 PM. If you have any questions, ask your nurse or doctor.          STOP taking these medications    ibuprofen 800 MG tablet Commonly known as: ADVIL Stopped by: Lottie Dawson, NP   Jardiance 10 MG Tabs tablet Generic drug: empagliflozin Stopped by: Lottie Dawson, NP       TAKE these medications    Accu-Chek Guide test strip Generic drug: glucose blood 3 (three) times daily.   atorvastatin 40 MG tablet Commonly known as: LIPITOR Take 1 tablet (40 mg total) by mouth daily.   B-D UF III MINI PEN NEEDLES 31G X 5 MM Misc Generic drug: Insulin Pen Needle Use to administer insulin 4 times daily   fenofibrate 145 MG tablet Commonly known as: TRICOR Take 1 tablet (145 mg total) by mouth daily.   FreeStyle Libre 2 Sensor Misc Inject 1 sensor to the skin every 14 days for continuous glucose monitoring.   HYDROcodone-acetaminophen 5-325 MG tablet Commonly known as: NORCO/VICODIN Take 1 tablet by  mouth in the morning, at noon, and at bedtime.   insulin glargine 100 UNIT/ML injection Commonly known as: LANTUS Inject 25 Units into the skin at bedtime.   linaclotide 290 MCG Caps capsule Commonly known as: LINZESS Take 290 mcg by mouth daily as needed.   LORazepam 1 MG tablet Commonly known as: ATIVAN SMARTSIG:1 Tablet(s) Sublingual   losartan 25 MG tablet Commonly known as: COZAAR Take 1 tablet (25 mg total) by mouth daily.   metFORMIN 1000 MG tablet Commonly known as: GLUCOPHAGE Take 1,000 mg by mouth 2 (two) times daily with a meal.   metoprolol succinate 25  MG 24 hr tablet Commonly known as: TOPROL-XL TAKE 1 AND 1/2 TABLETS BY MOUTH EVERY DAY   nitroGLYCERIN 0.4 MG SL tablet Commonly known as: NITROSTAT Place 1 tablet (0.4 mg total) under the tongue every 5 (five) minutes as needed for chest pain.   NovoLOG FlexPen 100 UNIT/ML FlexPen Generic drug: insulin aspart Inject 3 Units into the skin 3 (three) times daily with meals.   omeprazole 20 MG capsule Commonly known as: PRILOSEC Take 20 mg by mouth 2 (two) times daily.   QUEtiapine 400 MG tablet Commonly known as: SEROQUEL Take 800 mg by mouth at bedtime.        Allergies:  Allergies  Allergen Reactions   Lithium Other (See Comments)    Spinal fluid built up in brain   Trulicity [Dulaglutide] Nausea And Vomiting   Penicillins Other (See Comments)    UNKNOWN CHILDHOOD REACTION    Past Medical History, Surgical history, Social history, and Family History were reviewed and updated.  Review of Systems: All other 10 point review of systems is negative.   Physical Exam:  height is 5' 2"  (1.575 m) and weight is 196 lb (88.9 kg). Her blood pressure is 153/71 (abnormal) and her pulse is 67. Her respiration is 17 and oxygen saturation is 100%.   Wt Readings from Last 3 Encounters:  10/05/21 196 lb (88.9 kg)  07/29/21 191 lb (86.6 kg)  07/06/21 186 lb 12.8 oz (84.7 kg)    Ocular: Sclerae unicteric, pupils equal, round and reactive to light Ear-nose-throat: Oropharynx clear, dentition fair Lymphatic: left supraclavicular mass and left palpable nickel sized posterior cervical node, no axillary adenopathy Lungs no rales or rhonchi, good excursion bilaterally Heart regular rate and rhythm, no murmur appreciated Abd soft, nontender, positive bowel sounds MSK no focal spinal tenderness, no joint edema Neuro: non-focal, well-oriented, appropriate affect Breasts: Left breast firm at 12 o'clock position, right breast unremarkable  Lab Results  Component Value Date   WBC 7.4  10/05/2021   HGB 12.8 10/05/2021   HCT 37.9 10/05/2021   MCV 86.3 10/05/2021   PLT 391 10/05/2021   No results found for: FERRITIN, IRON, TIBC, UIBC, IRONPCTSAT Lab Results  Component Value Date   RBC 4.39 10/05/2021   No results found for: KPAFRELGTCHN, LAMBDASER, KAPLAMBRATIO No results found for: IGGSERUM, IGA, IGMSERUM No results found for: Odetta Pink, SPEI   Chemistry      Component Value Date/Time   NA 138 10/05/2021 0828   K 4.2 10/05/2021 0828   CL 103 10/05/2021 0828   CO2 27 10/05/2021 0828   BUN 11 10/05/2021 0828   CREATININE 0.81 10/05/2021 0828      Component Value Date/Time   CALCIUM 9.7 10/05/2021 0828   ALKPHOS 70 10/05/2021 0828   AST 14 (L) 10/05/2021 0828   ALT 21 10/05/2021 0828  BILITOT 0.3 10/05/2021 0828       Impression and Plan: Ariana Herrera is a very pleasant 55 yo caucasian female with history of stage IIA (pT2,pN0,cM0) IDC of the left breast, 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 She completed treatment (lumpectomy, chemo and radiation) in [place] in May 2020. She also developed and RLE DVT and thrombus of her central line which was felt to be provoked. She completed 2 years of full dose anticoagulation in August 2021.  She now has a large left supraclavicular mass and palpable left posterior cervical node noted on exam. Scans with PCP are concerning for metastatic disease. We are waiting for faxed report from their office and patient has requested a disc with imaging to take to [facility] radiology.  CA 27.29 is 30.  IR biopsy order placed. Dr. Marin Olp also aware.  We will schedule follow-up once results are available.  Roselyn Reef, RN oncology navigator notified.   Lottie Dawson, NP 12/12/20221:09 PM

## 2021-10-06 LAB — CANCER ANTIGEN 27.29: CA 27.29: 30 U/mL (ref 0.0–38.6)

## 2021-10-07 ENCOUNTER — Encounter: Payer: Self-pay | Admitting: *Deleted

## 2021-10-07 NOTE — Progress Notes (Signed)
Patient is an established patient with possible recurrence. Provider asked that I assist patient with her workup.   At this time patient needs to be scheduled for a biopsy, however we need the CT images on disc to load into our system for biopsy planning. Patient went to [facility] on [address] yesterday and today and has been unable to get the disc.   I called [facility] [phone]) and requested an update on the disc. They stated it was not ready for pick up and that a message would be left for the MD tomorrow. I expressed the urgent need of this disc and requested that high priority be placed on getting this disc to the patient. They stated they would call [name] when the disc was ready for pickup.  Called [name] and notified her of the above. She will call me back tomorrow if she doesn't hear from the office.   Oncology Nurse Navigator Documentation  Oncology Nurse Navigator Flowsheets 10/07/2021  Navigator Follow Up Date: 10/08/2021  Navigator Follow Up Reason: Radiology  Navigator Location [facility]  Navigator Encounter Type Telephone  Telephone Patient Update;Incoming Call  Patient Visit Type MedOnc  Treatment Phase Abnormal Scans  Barriers/Navigation Needs Coordination of Care;Education  Education Other  Interventions Education;Psycho-Social Support  Acuity Level 2-Minimal Needs (1-2 Barriers Identified)  Referrals -  Coordination of Care -  Education Method Verbal  Time Spent with Patient 30

## 2021-10-08 ENCOUNTER — Encounter: Payer: Self-pay | Admitting: *Deleted

## 2021-10-08 NOTE — Progress Notes (Signed)
Patient called stating she was still unable to pick up a CD of images. I called [facility] [phone]) and requested an update on the disc. The person I spoke to wasn't aware of any prior request. She stated she would leave a message requesting this. I once again expressed the urgency of this request and I requested a call back to my number when it was ready for pick up.  Notified patient of conversation. Will follow up with provider tomorrow.  Oncology Nurse Navigator Documentation  Oncology Nurse Navigator Flowsheets 10/08/2021  Navigator Follow Up Date: 10/09/2021  Navigator Follow Up Reason: Radiology  Navigator Location [facility]  Navigator Encounter Type Telephone  Telephone Patient Update;Incoming Call;Outgoing Call  Patient Visit Type MedOnc  Treatment Phase Abnormal Scans  Barriers/Navigation Needs Coordination of Care;Education  Education Other  Interventions Education;Psycho-Social Support  Acuity Level 2-Minimal Needs (1-2 Barriers Identified)  Referrals -  Coordination of Care -  Education Method Verbal  Time Spent with Patient 30

## 2021-10-09 ENCOUNTER — Encounter: Payer: Self-pay | Admitting: *Deleted

## 2021-10-09 NOTE — Progress Notes (Signed)
Received a call from [facility] stating the CD was ready for pick up. Spoke to [place]. Patient was not notified.   Called patient and notified her that her CD was ready for pick up. She can ask for [facility]. She will then bring the CD to [facility] for biopsy planning.  Will follow up next week for biopsy scheduling.  Oncology Nurse Navigator Documentation  Oncology Nurse Navigator Flowsheets 10/09/2021  Navigator Follow Up Date: 10/13/2021  Navigator Follow Up Reason: Radiology  Navigator Location [facility]  Navigator Encounter Type Telephone  Telephone Outgoing Call  Patient Visit Type MedOnc  Treatment Phase Abnormal Scans  Barriers/Navigation Needs Coordination of Care;Education  Education Other  Interventions Coordination of Care;Education  Acuity Level 2-Minimal Needs (1-2 Barriers Identified)  Referrals -  Coordination of Care Other  Education Method Verbal  Time Spent with Patient 30

## 2021-10-12 ENCOUNTER — Ambulatory Visit
Admission: RE | Admit: 2021-10-12 | Discharge: 2021-10-12 | Disposition: A | Discharge status: Home / Self Care | Payer: Self-pay | Source: Ambulatory Visit | Attending: Family | Admitting: Family

## 2021-10-12 ENCOUNTER — Other Ambulatory Visit (HOSPITAL_COMMUNITY): Payer: Self-pay | Admitting: Family

## 2021-10-12 DIAGNOSIS — R52 Pain, unspecified: Secondary | ICD-10-CM

## 2021-10-13 ENCOUNTER — Encounter (HOSPITAL_COMMUNITY): Payer: Self-pay | Admitting: Radiology

## 2021-10-13 ENCOUNTER — Encounter: Payer: Self-pay | Admitting: *Deleted

## 2021-10-13 NOTE — Progress Notes (Signed)
Patient Name  Ariana Herrera, Ariana Herrera Legal Sex  Female DOB  [birth year] SSN  [id number] Address  [address]  [place] [address] Phone  [phone] (Home)  [phone] (Mobile) *Preferred*    RE: CT Biopsy Received: Today Sandi Mariscal, MD  Garth Bigness D OK for US guided L supraclavicular mass Bx.   Neck CT -  11/30 - image 55, series 3.   Cathren Harsh        Previous Messages   ----- Message -----  From: Garth Bigness D  Sent: 10/13/2021   8:59 AM EST  To: Ir Procedure Requests  Subject: CT Biopsy                                       Procedure:  CT Biopsy   Reason:  Malignant neoplasm of upper-inner quadrant of left breast in female, estrogen receptor,  negative,  Supraclavicular mass,  Metastatic breast cancer, biopsy left supraclavicular mass, history of breast cancer triple negative, assessing for metastatic disease   History:  outside CT uploaded   Provider:  Celso Amy   Provider Contact:  [phone]

## 2021-10-13 NOTE — Progress Notes (Signed)
Patient scheduled for biopsy on 11/02/2021. Her supraclavicular mass is continuing to grow and her biopsy was ordered STAT on 10/05/21.   I contact several people in IR but was unable to get any movement on the patient's appointment. Spoke to the patient and she will travel wherever she needs to, to get the biopsy earlier.  Called [facility] scheduling at [phone]. Spoke to [place] who states they are unable to get the patient seen sooner. They will place me on their cancellation list and reach out if something opens up.   MD and NP notified. Called patient and made her aware that I was unsuccessful in getting the biopsy scheduled any sooner.   Oncology Nurse Navigator Documentation  Oncology Nurse Navigator Flowsheets 10/13/2021  Navigator Follow Up Date: 11/02/2021  Navigator Follow Up Reason: Other:  Navigator Location [facility]  Navigator Encounter Type Telephone;Appt/Treatment Plan Review  Telephone Incoming Call;Outgoing Call  Patient Visit Type MedOnc  Treatment Phase Abnormal Scans  Barriers/Navigation Needs Coordination of Care;Education  Education Other  Interventions Coordination of Care;Psycho-Social Support  Acuity Level 2-Minimal Needs (1-2 Barriers Identified)  Referrals -  Coordination of Care Radiology  Education Method Verbal  Time Spent with Patient 30

## 2021-10-29 ENCOUNTER — Other Ambulatory Visit: Payer: Self-pay | Admitting: Radiology

## 2021-10-30 ENCOUNTER — Other Ambulatory Visit: Payer: Self-pay | Admitting: Radiology

## 2021-11-02 ENCOUNTER — Other Ambulatory Visit: Payer: Self-pay | Admitting: Family

## 2021-11-02 ENCOUNTER — Encounter (HOSPITAL_COMMUNITY): Payer: Self-pay

## 2021-11-02 ENCOUNTER — Other Ambulatory Visit: Payer: Self-pay

## 2021-11-02 ENCOUNTER — Ambulatory Visit (HOSPITAL_COMMUNITY)
Admission: RE | Admit: 2021-11-02 | Discharge: 2021-11-02 | Disposition: A | Discharge status: Home / Self Care | Payer: Medicaid Other | Source: Ambulatory Visit | Attending: Family | Admitting: Family

## 2021-11-02 DIAGNOSIS — C50919 Malignant neoplasm of unspecified site of unspecified female breast: Secondary | ICD-10-CM

## 2021-11-02 DIAGNOSIS — R222 Localized swelling, mass and lump, trunk: Secondary | ICD-10-CM

## 2021-11-02 DIAGNOSIS — Z171 Estrogen receptor negative status [ER-]: Secondary | ICD-10-CM

## 2021-11-02 DIAGNOSIS — C50212 Malignant neoplasm of upper-inner quadrant of left female breast: Secondary | ICD-10-CM

## 2021-11-02 DIAGNOSIS — R221 Localized swelling, mass and lump, neck: Secondary | ICD-10-CM | POA: Insufficient documentation

## 2021-11-02 DIAGNOSIS — F17210 Nicotine dependence, cigarettes, uncomplicated: Secondary | ICD-10-CM | POA: Insufficient documentation

## 2021-11-02 HISTORY — PX: IR US GUIDE BX ASP/DRAIN: IMG2392

## 2021-11-02 LAB — GLUCOSE, CAPILLARY: Glucose-Capillary: 165 mg/dL — ABNORMAL HIGH (ref 70–99)

## 2021-11-02 MED ORDER — MIDAZOLAM HCL 2 MG/2ML IJ SOLN
INTRAMUSCULAR | Status: AC
  Filled 2021-11-02: qty 2

## 2021-11-02 MED ORDER — LIDOCAINE HCL 1 % IJ SOLN
INTRAMUSCULAR | Status: AC
  Administered 2021-11-02: 5 mL
  Filled 2021-11-02: qty 20

## 2021-11-02 MED ORDER — FENTANYL CITRATE (PF) 100 MCG/2ML IJ SOLN
INTRAMUSCULAR | Status: AC | PRN
  Administered 2021-11-02 (×2): 50 ug via INTRAVENOUS

## 2021-11-02 MED ORDER — MIDAZOLAM HCL 2 MG/2ML IJ SOLN
INTRAMUSCULAR | Status: AC | PRN
Start: 2021-11-02 — End: 2021-11-02
  Administered 2021-11-02 (×2): 1 mg via INTRAVENOUS

## 2021-11-02 MED ORDER — SODIUM CHLORIDE 0.9 % IV SOLN: INTRAVENOUS | Status: DC

## 2021-11-02 MED ORDER — FENTANYL CITRATE (PF) 100 MCG/2ML IJ SOLN
INTRAMUSCULAR | Status: AC
  Filled 2021-11-02: qty 2

## 2021-11-02 NOTE — H&P (Addendum)
Chief Complaint: Patient was seen in consultation today for left supraclavicular lymph node biopsy at the request of Carter,Sarah M  Referring Physician(s): Celso Amy  Supervising Physician: Sandi Mariscal  Patient Status: [facility] - Out-pt  History of Present Illness: Ariana Herrera is a 56 y.o. female   Hx Left Breast Ca 2019 Triple negative  Noticed Left neck LN last month Was small and NT It is enlarging now and somewhat painful ++smoker Outside films reviewed with Dr Pascal Lux Now scheduled for biopsy of L SCLN per Lottie Dawson NP  E[place] Post NP note 10/05/21: She now has a large left supraclavicular mass and palpable left posterior cervical node noted on exam. Scans with PCP are concerning for metastatic disease. We are waiting for faxed report from their office and patient has requested a disc with imaging to take to [facility] radiology.  CA 27.29 is 30.  IR biopsy order placed. Dr. Marin Olp also aware.  We will schedule follow-up once results are available.  Roselyn Reef, RN oncology navigator notified.   Past Medical History:  Diagnosis Date   Anginal pain ([place])    Asthma    Bipolar 1 disorder ([place])    Bipolar disease, chronic ([place])    Breast cancer in female [facility])    Coronary artery disease    Deep vein blood clot of right lower extremity (HCC)    Diabetes mellitus type 2 in obese ([place])    Hyperlipidemia    MI (myocardial infarction) ([place])    was in [place]   Obesity    Personal history of chemotherapy    Personal history of radiation therapy     Past Surgical History:  Procedure Laterality Date   BREAST LUMPECTOMY Left 03/2019   IR REMOVAL TUN ACCESS W/ PORT W/O FL MOD SED  12/20/2019   LEFT HEART CATH AND CORONARY ANGIOGRAPHY N/A 08/22/2020   Procedure: LEFT HEART CATH AND CORONARY ANGIOGRAPHY;  Surgeon: Burnell Blanks, MD;  Location: [facility];  Service: Cardiovascular;  Laterality: N/A;    Allergies: Lithium, Trulicity [dulaglutide], and  Penicillins  Medications: Prior to Admission medications   Medication Sig Start Date End Date Taking? Authorizing Provider  ACCU-CHEK GUIDE test strip 3 (three) times daily. 07/17/21  Yes [provider]  albuterol (VENTOLIN HFA) 108 (90 Base) MCG/ACT inhaler Inhale 2 puffs into the lungs every 6 (six) hours as needed for wheezing or shortness of breath.   Yes [provider]  atorvastatin (LIPITOR) 40 MG tablet Take 1 tablet (40 mg total) by mouth daily. 12/05/20  Yes Cleaver, Jossie Ng, NP  Continuous Blood Gluc Sensor (FREESTYLE LIBRE 2 SENSOR) MISC Inject 1 sensor to the skin every 14 days for continuous glucose monitoring. 12/09/20  Yes [provider]  fenofibrate (TRICOR) 145 MG tablet Take 1 tablet (145 mg total) by mouth daily. 12/05/20  Yes Cleaver, Jossie Ng, NP  HYDROcodone-acetaminophen (NORCO/VICODIN) 5-325 MG tablet Take 1 tablet by mouth 3 (three) times daily. 11/26/19  Yes [provider]  insulin aspart (NOVOLOG FLEXPEN) 100 UNIT/ML FlexPen Inject 3 Units into the skin 3 (three) times daily with meals. Patient taking differently: Inject 10-16 Units into the skin 3 (three) times daily with meals. 08/25/20  Yes Debbe Odea, MD  insulin glargine (LANTUS) 100 UNIT/ML injection Inject 25 Units into the skin at bedtime.   Yes [provider]  Insulin Pen Needle (B-D UF III MINI PEN NEEDLES) 31G X 5 MM MISC Use to administer insulin 4 times daily 11/07/20  Yes [provider]  losartan (COZAAR) 25 MG tablet Take 1 tablet (25 mg total) by mouth daily. Patient taking differently: Take 12.5 mg by mouth daily. 08/26/20  Yes Debbe Odea, MD  metFORMIN (GLUCOPHAGE) 1000 MG tablet Take 1,000 mg by mouth 2 (two) times daily with a meal. 11/26/19  Yes [provider]  metoprolol succinate (TOPROL-XL) 25 MG 24 hr tablet TAKE 1 AND 1/2 TABLETS BY MOUTH EVERY DAY Patient taking differently: Take 25 mg by mouth daily. 09/22/21  Yes Hilty, Nadean Corwin, MD  omeprazole (PRILOSEC) 20 MG capsule Take 20 mg by mouth daily. 10/10/20  Yes [provider]  pravastatin (PRAVACHOL) 20 MG tablet Take 20 mg by mouth daily. 09/20/21  Yes [provider]  QUEtiapine (SEROQUEL) 400 MG tablet Take 800 mg by mouth at bedtime.   Yes [provider]  nitroGLYCERIN (NITROSTAT) 0.4 MG SL tablet Place 1 tablet (0.4 mg total) under the tongue every 5 (five) minutes as needed for chest pain. 09/04/20   Deberah Pelton, NP  Potassium 99 MG TABS Take 99 mg by mouth daily.    [provider]     Family History  Problem Relation Age of Onset   Heart attack Mother 43    Social History   Socioeconomic History   Marital status: Unknown    Spouse name: Not on file   Number of children: Not on file   Years of education: Not on file   Highest education level: Not on file  Occupational History   Occupation: disabled due to neuropathy and cancer treatments  Tobacco Use   Smoking status: Every Day    Packs/day: 2.00    Years: 36.00    Pack years: 72.00    Types: Cigarettes    Start date: 11/26/2019   Smokeless tobacco: Never  Vaping Use   Vaping Use: Never used  Substance and Sexual Activity   Alcohol use: Never   Drug use: Yes    Types: Marijuana    Comment: occasionally   Sexual activity: Not on file  Other Topics Concern   Not on file  Social History Narrative   Not on file   Social Determinants of Health   Financial Resource Strain: Not on file  Food Insecurity: Not on file  Transportation Needs: Not on file  Physical Activity: Not on file  Stress: Not on file  Social Connections: Not on file    Review of Systems: A 12 point ROS discussed and pertinent positives are indicated in the HPI above.  All other systems are negative.  Review of Systems  Constitutional:  Negative for activity change, fatigue and fever.  HENT:  Negative for sore throat, tinnitus and trouble swallowing.   Respiratory:  Negative  for cough and shortness of breath.   Cardiovascular:  Negative for chest pain.  Gastrointestinal:  Negative for abdominal pain, nausea and vomiting.  Musculoskeletal:  Positive for neck pain.  Psychiatric/Behavioral:  Negative for behavioral problems and confusion.    Vital Signs: BP (!) 146/84 (BP Location: Right Arm)    Pulse (!) 112    Temp 98.3 F (36.8 C) (Oral)    Resp 18    SpO2 99%   Physical Exam HENT:     Mouth/Throat:     Mouth: Mucous membranes are moist.  Neck:     Comments: Left neck tender Palpation of Left neck Lymph node LN is firm; large and tender Cardiovascular:     Rate and Rhythm:  Normal rate and regular rhythm.     Heart sounds: Normal heart sounds.  Pulmonary:     Effort: Pulmonary effort is normal.     Breath sounds: Normal breath sounds.  Abdominal:     Palpations: Abdomen is soft.  Musculoskeletal:        General: Normal range of motion.     Cervical back: Normal range of motion. Tenderness present.  Skin:    General: Skin is warm.  Neurological:     Mental Status: She is alert and oriented to person, place, and time.  Psychiatric:        Behavior: Behavior normal.    Imaging: No results found.  Labs:  CBC: Recent Labs    12/23/20 1347 07/29/21 1228 10/05/21 0828  WBC 6.6 8.7 7.4  HGB 13.0 12.8 12.8  HCT 38.2 37.6 37.9  PLT 311 422* 391    COAGS: No results for input(s): INR, APTT in the last 8760 hours.  BMP: Recent Labs    12/23/20 1347 07/29/21 1228 10/05/21 0828  NA 138 136 138  K 4.6 4.3 4.2  CL 102 104 103  CO2 29 21* 27  GLUCOSE 185* 240* 164*  BUN 12 22* 11  CALCIUM 9.9 9.5 9.7  CREATININE 0.83 0.92 0.81  GFRNONAA >60 >60 >60    LIVER FUNCTION TESTS: Recent Labs    12/23/20 1347 07/13/21 0831 07/29/21 1228 10/05/21 0828  BILITOT 0.4 0.3 0.3 0.3  AST 10* 13 12* 14*  ALT 11 17 13 21   ALKPHOS 82 66 45 70  PROT 7.0 6.9 7.0 6.8  ALBUMIN 4.2 4.5 4.2 4.3    TUMOR MARKERS: No results for input(s):  AFPTM, CEA, CA199, CHROMGRNA in the last 8760 hours.  Assessment and Plan:  Left Supraclavicular Lymph node Enlarging since onset 1 mo ago Hx Left breast cancer 2019 Scheduled now for biopsy of same Risks and benefits of left supraclavicular lymph node biopsy was discussed with the patient and/or patient's family including, but not limited to bleeding, infection, damage to adjacent structures or low yield requiring additional tests.  All of the questions were answered and there is agreement to proceed.  Consent signed and in chart.   Thank you for this interesting consult.  I greatly enjoyed meeting Ariana Herrera and look forward to participating in their care.  A copy of this report was sent to the requesting provider on this date.  Electronically Signed: Lavonia Drafts, PA-C 11/02/2021, 11:29 AM   I spent a total of  30 Minutes   in face to face in clinical consultation, greater than 50% of which was counseling/coordinating care for left supraclavicular lymph node bx

## 2021-11-02 NOTE — Sedation Documentation (Signed)
Awaiting orders to transfer pt to [facility]. MD paged

## 2021-11-02 NOTE — Sedation Documentation (Signed)
Patient is resting comfortably. 

## 2021-11-02 NOTE — Procedures (Signed)
Pre Procedure Dx: Left neck mass Post Procedural Dx: Same  Technically successful US guided biopsy of indeterminate infiltrative ill defined left sided neck mass.   EBL: None  No immediate complications.   Ronny Bacon, MD Pager #: [phone]

## 2021-11-02 NOTE — Sedation Documentation (Signed)
Vital signs stable. 

## 2021-11-03 ENCOUNTER — Encounter: Payer: Self-pay | Admitting: *Deleted

## 2021-11-03 NOTE — Progress Notes (Signed)
Patient had a technically successful biopsy of her clavicular mass. Will follow for path results.   Oncology Nurse Navigator Documentation  Oncology Nurse Navigator Flowsheets 11/03/2021  Navigator Follow Up Date: 11/05/2021  Navigator Follow Up Reason: Pathology  Navigator Location [facility]  Navigator Encounter Type Appt/Treatment Plan Review  Telephone -  Patient Visit Type MedOnc  Treatment Phase Abnormal Scans  Barriers/Navigation Needs Coordination of Care;Education  Education -  Interventions None Required  Acuity Level 2-Minimal Needs (1-2 Barriers Identified)  Referrals -  Coordination of Care -  Education Method -  Support Groups/Services Friends and Family  Time Spent with Patient 15

## 2021-11-04 ENCOUNTER — Encounter: Payer: Self-pay | Admitting: *Deleted

## 2021-11-04 LAB — SURGICAL PATHOLOGY

## 2021-11-04 NOTE — Progress Notes (Signed)
Received message from pathologist that biopsy is consistent with metastatic breast cancer. Spoke with Dr Marin Olp who would like to see patient this week.   Called patient and scheduled appointment for 11/06/2021. She is aware of time, date and location. Confirmed that pathology had been posted, but did not discuss results.   Oncology Nurse Navigator Documentation  Oncology Nurse Navigator Flowsheets 11/04/2021  Confirmed Diagnosis Date 11/02/2021  Diagnosis Status Recurrent  Navigator Follow Up Date: 11/06/2021  Navigator Follow Up Reason: Follow-up After Biopsy  Navigator Location [facility]  Navigator Encounter Type Telephone;Pathology Review  Telephone Outgoing Call;Education;Appt Confirmation/Clarification  Patient Visit Type MedOnc  Treatment Phase Pre-Tx/Tx Discussion  Barriers/Navigation Needs Coordination of Care;Education  Education Other  Interventions Coordination of Care;Education  Acuity Level 2-Minimal Needs (1-2 Barriers Identified)  Referrals -  Coordination of Care Appts  Education Method Verbal  Support Groups/Services Friends and Family  Time Spent with Patient 66

## 2021-11-06 ENCOUNTER — Other Ambulatory Visit: Payer: Self-pay

## 2021-11-06 ENCOUNTER — Encounter: Payer: Self-pay | Admitting: *Deleted

## 2021-11-06 ENCOUNTER — Inpatient Hospital Stay (HOSPITAL_BASED_OUTPATIENT_CLINIC_OR_DEPARTMENT_OTHER): Payer: Medicaid Other | Admitting: Hematology & Oncology

## 2021-11-06 ENCOUNTER — Encounter: Payer: Self-pay | Admitting: Hematology & Oncology

## 2021-11-06 ENCOUNTER — Inpatient Hospital Stay: Payer: Medicaid Other | Attending: Hematology & Oncology

## 2021-11-06 VITALS — BP 136/70 | HR 95 | Temp 98.4°F | Resp 18 | Ht 62.0 in | Wt 196.1 lb

## 2021-11-06 DIAGNOSIS — R222 Localized swelling, mass and lump, trunk: Secondary | ICD-10-CM

## 2021-11-06 DIAGNOSIS — Z79899 Other long term (current) drug therapy: Secondary | ICD-10-CM | POA: Diagnosis not present

## 2021-11-06 DIAGNOSIS — C7989 Secondary malignant neoplasm of other specified sites: Secondary | ICD-10-CM | POA: Insufficient documentation

## 2021-11-06 DIAGNOSIS — Z171 Estrogen receptor negative status [ER-]: Secondary | ICD-10-CM | POA: Insufficient documentation

## 2021-11-06 DIAGNOSIS — F17210 Nicotine dependence, cigarettes, uncomplicated: Secondary | ICD-10-CM | POA: Diagnosis not present

## 2021-11-06 DIAGNOSIS — C50912 Malignant neoplasm of unspecified site of left female breast: Secondary | ICD-10-CM | POA: Insufficient documentation

## 2021-11-06 DIAGNOSIS — C50919 Malignant neoplasm of unspecified site of unspecified female breast: Secondary | ICD-10-CM

## 2021-11-06 DIAGNOSIS — C50011 Malignant neoplasm of nipple and areola, right female breast: Secondary | ICD-10-CM

## 2021-11-06 DIAGNOSIS — C50212 Malignant neoplasm of upper-inner quadrant of left female breast: Secondary | ICD-10-CM

## 2021-11-06 LAB — CMP (CANCER CENTER ONLY)
ALT: 24 U/L (ref 0–44)
AST: 16 U/L (ref 15–41)
Albumin: 4.3 g/dL (ref 3.5–5.0)
Alkaline Phosphatase: 89 U/L (ref 38–126)
Anion gap: 9 (ref 5–15)
BUN: 10 mg/dL (ref 6–20)
CO2: 24 mmol/L (ref 22–32)
Calcium: 9.8 mg/dL (ref 8.9–10.3)
Chloride: 106 mmol/L (ref 98–111)
Creatinine: 0.79 mg/dL (ref 0.44–1.00)
GFR, Estimated: >60 mL/min (ref >60)
Glucose, Bld: 95 mg/dL (ref 70–99)
Potassium: 4.2 mmol/L (ref 3.5–5.1)
Sodium: 139 mmol/L (ref 135–145)
Total Bilirubin: 0.3 mg/dL (ref 0.3–1.2)
Total Protein: 6.7 g/dL (ref 6.5–8.1)

## 2021-11-06 LAB — CBC WITH DIFFERENTIAL (CANCER CENTER ONLY)
Abs Immature Granulocytes: 0.03 10*3/uL (ref 0.00–0.07)
Basophils Absolute: 0 10*3/uL (ref 0.0–0.1)
Basophils Relative: 1 %
Eosinophils Absolute: 0.1 10*3/uL (ref 0.0–0.5)
Eosinophils Relative: 1 %
HCT: 36.5 % (ref 36.0–46.0)
Hemoglobin: 12.4 g/dL (ref 12.0–15.0)
Immature Granulocytes: 0 %
Lymphocytes Relative: 25 %
Lymphs Abs: 1.8 10*3/uL (ref 0.7–4.0)
MCH: 29.2 pg (ref 26.0–34.0)
MCHC: 34 g/dL (ref 30.0–36.0)
MCV: 86.1 fL (ref 80.0–100.0)
Monocytes Absolute: 0.6 10*3/uL (ref 0.1–1.0)
Monocytes Relative: 8 %
Neutro Abs: 4.8 10*3/uL (ref 1.7–7.7)
Neutrophils Relative %: 65 %
Platelet Count: 397 10*3/uL (ref 150–400)
RBC: 4.24 MIL/uL (ref 3.87–5.11)
RDW: 14.4 % (ref 11.5–15.5)
WBC Count: 7.3 10*3/uL (ref 4.0–10.5)
nRBC: 0 % (ref 0.0–0.2)

## 2021-11-06 NOTE — Progress Notes (Signed)
Met with patient prior to visit with MD. Explained that Dr Marin Olp would be in to explain biopsy results and that a plan would be made from there. I will assist with any further workup needed. I have her, and her fiance, my business card with contact info.   She does not work at this time and she has a good support network.   Patient needs a PET scan. Scheduled for 11/17/2021. Called patient and reviewed this appointment with her including time, date and location. PET prep was also reviewed and radiology info sheet mailed to patient home for education reinforcement.   Spoke with Dr Marin Olp and at this time, patient has no further needs.   Oncology Nurse Navigator Documentation  Oncology Nurse Navigator Flowsheets 11/06/2021  Confirmed Diagnosis Date -  Diagnosis Status -  Navigator Follow Up Date: 11/17/2021  Navigator Follow Up Reason: Scan Review  Navigator Location [facility]  Navigator Encounter Type Follow-up Appt  Telephone -  Patient Visit Type MedOnc  Treatment Phase Pre-Tx/Tx Discussion  Barriers/Navigation Needs Coordination of Care  Education Other  Interventions Coordination of Care;Education;Psycho-Social Support  Acuity Level 2-Minimal Needs (1-2 Barriers Identified)  Referrals -  Coordination of Care Radiology  Education Method Verbal;Written  Support Groups/Services Friends and Family  Time Spent with Patient 73

## 2021-11-06 NOTE — Progress Notes (Signed)
Hematology and Oncology Follow Up Visit  BARBARANN KELLY [id number] 09-Dec-1965 56 y.o. 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 is back for follow-up.  We first saw her last year.  At that time, she had been previously treated in [place] for a stage IIa triple negative ductal carcinoma of the left breast.  She had adjuvant chemotherapy with AC-T.  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  This was back in March 2020.  She then underwent adjuvant radiation therapy.  She completed this in May 2020.  She has been in [place] since then.  She has been doing pretty well until recently.  She noted some swelling in the left neck.  She subsequently had a biopsy.  This was done 11/02/2021.  The pathology report ([id number]) showed an invasive ductal carcinoma.  The tumor was weakly ER positive, PR negative and HER2 negative.  Where she had CA 27.29 done back in December.  This was normal at 30.  At this point, we have to really stage her.  We have to see exactly what we are dealing with.  More think that with the normal CA 27.29 that she may not have extensive or metastatic disease and this may be a local type of recurrence.  She is still smoking.  She says she smokes about a pack and a half a day.  She has had no problems with headache.  She has had no cough.  There is been no nausea or vomiting.  She has not lost weight.  She has had no leg swelling.  She is debilitated by neuropathy.  Overall, I would say performance status is ECOG 1.  Medications:  Current Outpatient Medications:    ACCU-CHEK GUIDE test strip, 3 (three) times daily., Disp: , Rfl:    albuterol (VENTOLIN HFA) 108 (90 Base) MCG/ACT inhaler, Inhale 2 puffs into the lungs every 6 (six) hours as needed for wheezing or shortness of breath., Disp: , Rfl:     Continuous Blood Gluc Sensor (FREESTYLE LIBRE 2 SENSOR) MISC, Inject 1 sensor to the skin every 14 days for continuous glucose monitoring., Disp: , Rfl:    HYDROcodone-acetaminophen (NORCO/VICODIN) 5-325 MG tablet, Take 1 tablet by mouth 3 (three) times daily., Disp: , Rfl:    insulin aspart (NOVOLOG FLEXPEN) 100 UNIT/ML FlexPen, Inject 3 Units into the skin 3 (three) times daily with meals. (Patient taking differently: Inject 10-16 Units into the skin 3 (three) times daily with meals.), Disp: 15 mL, Rfl: 0   insulin glargine (LANTUS) 100 UNIT/ML injection, Inject 25 Units into the skin at bedtime., Disp: , Rfl:    Insulin Pen Needle (B-D UF III MINI PEN NEEDLES) 31G X 5 MM MISC, Use to administer insulin 4 times daily, Disp: , Rfl:    losartan (COZAAR) 25 MG tablet, Take 1 tablet (25 mg total) by mouth daily. (Patient taking differently: Take 12.5 mg by mouth daily.), Disp: 30 tablet, Rfl: 0   metFORMIN (GLUCOPHAGE) 1000 MG tablet, Take 1,000 mg by mouth 2 (two) times daily with a meal., Disp: , Rfl:    metoprolol succinate (TOPROL-XL) 25 MG 24 hr tablet, TAKE 1 AND 1/2 TABLETS BY MOUTH EVERY DAY (Patient taking differently: Take 25 mg by mouth daily.), Disp: 45 tablet, Rfl: 10   omeprazole (PRILOSEC) 20 MG capsule, Take 20 mg by mouth daily., Disp: ,  Rfl:    Potassium 99 MG TABS, Take 99 mg by mouth daily., Disp: , Rfl:    pravastatin (PRAVACHOL) 20 MG tablet, Take 20 mg by mouth daily., Disp: , Rfl:    QUEtiapine (SEROQUEL) 400 MG tablet, Take 800 mg by mouth at bedtime., Disp: , Rfl:    nitroGLYCERIN (NITROSTAT) 0.4 MG SL tablet, Place 1 tablet (0.4 mg total) under the tongue every 5 (five) minutes as needed for chest pain. (Patient not taking: Reported on 11/06/2021), Disp: 25 tablet, Rfl: 3  Allergies:  Allergies  Allergen Reactions   Lithium Other (See Comments)    Spinal fluid built up in brain   Trulicity [Dulaglutide] Nausea And Vomiting   Penicillins Other (See Comments)    UNKNOWN  CHILDHOOD REACTION    Past Medical History, Surgical history, Social history, and Family History were reviewed and updated.  Review of Systems: Review of Systems  Constitutional: Negative.   HENT:   Positive for lump/mass.   Eyes: Negative.   Respiratory: Negative.    Cardiovascular: Negative.   Gastrointestinal: Negative.   Endocrine: Negative.   Genitourinary: Negative.    Musculoskeletal: Negative.   Skin: Negative.   Neurological:  Positive for numbness.  Hematological: Negative.   Psychiatric/Behavioral: Negative.     Physical Exam:  height is _0  (1.575 m) and weight is 196 lb 1.3 oz (88.9 kg). Her oral temperature is 98.4 F (36.9 C). Her blood pressure is 136/70 and her pulse is 95. Her respiration is 18 and oxygen saturation is 100%.   Wt Readings from Last 3 Encounters:  11/06/21 196 lb 1.3 oz (88.9 kg)  10/05/21 196 lb (88.9 kg)  07/29/21 191 lb (86.6 kg)    Physical Exam Vitals reviewed.  Constitutional:      Comments: Her breast exam shows right breast with no masses, edema or erythema.  There is no right axillary adenopathy.  Left breast is retracted from radiation and surgery.  There is some hyperpigmentation of the left breast.  She has the lumpectomy site has well-healed.  She has little bit of radiation telangiectasias at the biopsy site.  There is no left axillary adenopathy.  HENT:     Head: Normocephalic and atraumatic.  Eyes:     Pupils: Pupils are equal, round, and reactive to light.  Neck:     Comments: Neck exam shows some fullness in the left neck.  There is a mass that is palpable.  It is not all that mobile.  It probably measures about 4 x 4 cm.  Right neck is unremarkable. Cardiovascular:     Rate and Rhythm: Normal rate and regular rhythm.     Heart sounds: Normal heart sounds.  Pulmonary:     Effort: Pulmonary effort is normal.     Breath sounds: Normal breath sounds.  Abdominal:     General: Bowel sounds are normal.     Palpations:  Abdomen is soft.  Musculoskeletal:        General: No tenderness or deformity. Normal range of motion.     Cervical back: Normal range of motion.  Lymphadenopathy:     Cervical: No cervical adenopathy.  Skin:    General: Skin is warm and dry.     Findings: No erythema or rash.  Neurological:     Mental Status: She is alert and oriented to person, place, and time.  Psychiatric:        Behavior: Behavior normal.        Thought Content:  Thought content normal.        Judgment: Judgment normal.     Lab Results  Component Value Date   WBC 7.3 11/06/2021   HGB 12.4 11/06/2021   HCT 36.5 11/06/2021   MCV 86.1 11/06/2021   PLT 397 11/06/2021     Chemistry      Component Value Date/Time   NA 139 11/06/2021 1035   K 4.2 11/06/2021 1035   CL 106 11/06/2021 1035   CO2 24 11/06/2021 1035   BUN 10 11/06/2021 1035   CREATININE 0.79 11/06/2021 1035      Component Value Date/Time   CALCIUM 9.8 11/06/2021 1035   ALKPHOS 89 11/06/2021 1035   AST 16 11/06/2021 1035   ALT 24 11/06/2021 1035   BILITOT 0.3 11/06/2021 1035      Impression and Plan: [name] is a very charming 56 year old white female.  Looks like she has recurrent ductal carcinoma of the left breast.  Again, I think the issue now is how extensive is the recurrence.  It does not escape me for the fact that this is in the left supraclavicular region where she had the breast cancer in the left breast.  I suspect that this probably is outside of the radiation field.  I think that if it we do a PET scan and find that she only has disease in the left supraclavicular region, then we might consider trying to resect the recurrence out and then given her radiation and chemotherapy.  I think that we can be aggressive.  She is young.  She is healthy.  I know that she still smokes but I she is overall in pretty decent shape.  I had a wonderful time talking with she and her fianc.  We will get the PET scan set up.  I do not  think we have to do molecular test right now.  We will plan to get her back once we have the results back from the PET scan.  If the PET scan only shows disease in the left supraclavicular region, then I might get an MRI of the neck to see if we can better identify spatial relationships with structures.   Volanda Napoleon, MD 1/13/20235:26 PM

## 2021-11-07 LAB — CANCER ANTIGEN 27.29: CA 27.29: 41.9 U/mL — ABNORMAL HIGH (ref 0.0–38.6)

## 2021-11-10 ENCOUNTER — Other Ambulatory Visit: Payer: Medicaid Other

## 2021-11-17 ENCOUNTER — Ambulatory Visit (HOSPITAL_COMMUNITY)
Admission: RE | Admit: 2021-11-17 | Discharge: 2021-11-17 | Disposition: A | Discharge status: Home / Self Care | Payer: Medicaid Other | Source: Ambulatory Visit | Attending: Hematology & Oncology | Admitting: Hematology & Oncology

## 2021-11-17 ENCOUNTER — Other Ambulatory Visit: Payer: Self-pay

## 2021-11-17 DIAGNOSIS — C50011 Malignant neoplasm of nipple and areola, right female breast: Secondary | ICD-10-CM | POA: Insufficient documentation

## 2021-11-17 LAB — GLUCOSE, CAPILLARY: Glucose-Capillary: 184 mg/dL — ABNORMAL HIGH (ref 70–99)

## 2021-11-17 MED ORDER — FLUDEOXYGLUCOSE F - 18 (FDG) INJECTION
9.7000 | Freq: Once | INTRAVENOUS | Status: AC
  Administered 2021-11-17: 12:00:00 9.78 via INTRAVENOUS

## 2021-11-18 ENCOUNTER — Encounter: Payer: Self-pay | Admitting: *Deleted

## 2021-11-18 NOTE — Progress Notes (Signed)
PET scan resulted and reviewed with Dr Marin Olp. He will speak to patient regarding results and patient will come in Monday for follow up.   Oncology Nurse Navigator Documentation  Oncology Nurse Navigator Flowsheets 11/18/2021  Confirmed Diagnosis Date -  Diagnosis Status -  Navigator Follow Up Date: 11/23/2021  Navigator Follow Up Reason: Follow-up Appointment  Navigator Location [facility]  Navigator Encounter Type Scan Review  Telephone -  Patient Visit Type MedOnc  Treatment Phase Pre-Tx/Tx Discussion  Barriers/Navigation Needs Coordination of Care  Education -  Interventions Coordination of Care  Acuity Level 2-Minimal Needs (1-2 Barriers Identified)  Referrals -  Coordination of Care Other  Education Method -  Support Groups/Services Friends and Family  Time Spent with Patient 30

## 2021-11-20 ENCOUNTER — Telehealth: Payer: Self-pay | Admitting: *Deleted

## 2021-11-20 NOTE — Telephone Encounter (Signed)
Per scheduling message Lorriane Shire per Dr. Marin Olp - called and gave upcoming appointment - confirmed

## 2021-11-23 ENCOUNTER — Encounter: Payer: Self-pay | Admitting: *Deleted

## 2021-11-23 ENCOUNTER — Encounter: Payer: Self-pay | Admitting: Hematology & Oncology

## 2021-11-23 ENCOUNTER — Inpatient Hospital Stay (HOSPITAL_BASED_OUTPATIENT_CLINIC_OR_DEPARTMENT_OTHER): Payer: Medicaid Other | Admitting: Hematology & Oncology

## 2021-11-23 ENCOUNTER — Other Ambulatory Visit: Payer: Self-pay

## 2021-11-23 VITALS — BP 123/75 | HR 112 | Temp 98.4°F | Resp 18 | Wt 192.0 lb

## 2021-11-23 DIAGNOSIS — Z7189 Other specified counseling: Secondary | ICD-10-CM | POA: Insufficient documentation

## 2021-11-23 DIAGNOSIS — C50011 Malignant neoplasm of nipple and areola, right female breast: Secondary | ICD-10-CM

## 2021-11-23 DIAGNOSIS — C50912 Malignant neoplasm of unspecified site of left female breast: Secondary | ICD-10-CM | POA: Diagnosis not present

## 2021-11-23 HISTORY — DX: Other specified counseling: Z71.89

## 2021-11-23 MED ORDER — OLANZAPINE 10 MG PO TABS: 10.0000 mg | ORAL_TABLET | Freq: Every day | ORAL | 4 refills | Status: DC

## 2021-11-23 MED ORDER — MORPHINE SULFATE ER 15 MG PO TBCR
15.0000 mg | EXTENDED_RELEASE_TABLET | Freq: Two times a day (BID) | ORAL | 0 refills | Status: DC
Start: 2021-11-23 — End: 2021-12-21

## 2021-11-23 NOTE — Progress Notes (Signed)
Hematology and Oncology Follow Up Visit  Ariana Herrera [id number] 1966-03-18 56 y.o. 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  We did do a PET scan on her.  The PET scan did show that she had disease outside of the left neck.  She had right axillary adenopathy.  She had some lymph nodes in the mediastinum.  There may been a breast nodule in the right breast.  The biopsy that was taken was basically triple negative.  Her last CA 27.29 was 41.  Think that we can have to go with systemic chemotherapy.  I recommend that she is having more problems with discomfort.  She is on Norco.  I think she needs a long-acting pain medication.  I probably put her on MS Contin at 15 mg p.o. twice daily illicit this can help with some of her pain issues.  She is having some problems with nausea.  I will try her on some Zyprexa to see if this might help.  She has a good performance status.  As such, I think we can be aggressive.  I would probably consider her for systemic chemoimmunotherapy.  I will try her on carboplatinum/Gemzar along with Keytruda.  I talked to she and her partner.  I explained the chemotherapy protocol.  I went over the side effects.  She has a good understanding of all this.  Hopefully, we can see a good response.  If so, then we might still think about some kind of surgical resection.  She has had no change in bowel or bladder habits.  She has had no cough.  I think she is still smokes a little bit.  Overall, I would have to say that her performance status is probably ECOG 1.   Medications:  Current Outpatient Medications:    ACCU-CHEK GUIDE test strip, 3 (three) times daily., Disp: , Rfl:    albuterol (VENTOLIN HFA) 108 (90 Base) MCG/ACT inhaler, Inhale  2 puffs into the lungs every 6 (six) hours as needed for wheezing or shortness of breath., Disp: , Rfl:    Continuous Blood Gluc Sensor (FREESTYLE LIBRE 2 SENSOR) MISC, Inject 1 sensor to the skin every 14 days for continuous glucose monitoring., Disp: , Rfl:    fenofibrate (TRICOR) 145 MG tablet, Take 145 mg by mouth daily., Disp: , Rfl:    HYDROcodone-acetaminophen (NORCO/VICODIN) 5-325 MG tablet, Take 1 tablet by mouth 3 (three) times daily., Disp: , Rfl:    insulin aspart (NOVOLOG FLEXPEN) 100 UNIT/ML FlexPen, Inject 3 Units into the skin 3 (three) times daily with meals. (Patient taking differently: Inject 10-16 Units into the skin 3 (three) times daily with meals.), Disp: 15 mL, Rfl: 0   insulin glargine (LANTUS) 100 UNIT/ML injection, Inject 25 Units into the skin at bedtime., Disp: , Rfl:    Insulin Pen Needle (B-D UF III MINI PEN NEEDLES) 31G X 5 MM MISC, Use to administer insulin 4 times daily, Disp: , Rfl:    losartan (COZAAR) 25 MG tablet, Take 1 tablet (25 mg total) by mouth daily. (Patient taking differently: Take 12.5 mg by mouth daily.), Disp: 30 tablet, Rfl: 0   metFORMIN (GLUCOPHAGE) 1000 MG tablet, Take 1,000 mg by mouth 2 (two) times daily with a meal., Disp: , Rfl:    metoprolol succinate (TOPROL-XL)  25 MG 24 hr tablet, TAKE 1 AND 1/2 TABLETS BY MOUTH EVERY DAY (Patient taking differently: Take 25 mg by mouth daily.), Disp: 45 tablet, Rfl: 10   nitroGLYCERIN (NITROSTAT) 0.4 MG SL tablet, Place 1 tablet (0.4 mg total) under the tongue every 5 (five) minutes as needed for chest pain. (Patient not taking: Reported on 11/06/2021), Disp: 25 tablet, Rfl: 3   omeprazole (PRILOSEC) 20 MG capsule, Take 20 mg by mouth daily., Disp: , Rfl:    Potassium 99 MG TABS, Take 99 mg by mouth daily., Disp: , Rfl:    pravastatin (PRAVACHOL) 20 MG tablet, Take 20 mg by mouth daily., Disp: , Rfl:    QUEtiapine (SEROQUEL) 400 MG tablet, Take 800 mg by mouth at bedtime., Disp: , Rfl:   Allergies:   Allergies  Allergen Reactions   Lithium Other (See Comments)    Spinal fluid built up in brain   Trulicity [Dulaglutide] Nausea And Vomiting   Penicillins Other (See Comments)    UNKNOWN CHILDHOOD REACTION    Past Medical History, Surgical history, Social history, and Family History were reviewed and updated.  Review of Systems: Review of Systems  Constitutional: Negative.   HENT:   Positive for lump/mass.   Eyes: Negative.   Respiratory: Negative.    Cardiovascular: Negative.   Gastrointestinal: Negative.   Endocrine: Negative.   Genitourinary: Negative.    Musculoskeletal: Negative.   Skin: Negative.   Neurological:  Positive for numbness.  Hematological: Negative.   Psychiatric/Behavioral: Negative.     Physical Exam:  weight is 192 lb (87.1 kg). Her oral temperature is 98.4 F (36.9 C). Her blood pressure is 123/75 and her pulse is 112 (abnormal). Her respiration is 18 and oxygen saturation is 99%.   Wt Readings from Last 3 Encounters:  11/23/21 192 lb (87.1 kg)  11/06/21 196 lb 1.3 oz (88.9 kg)  10/05/21 196 lb (88.9 kg)    Physical Exam Vitals reviewed.  Constitutional:      Comments: Her breast exam shows right breast with no masses, edema or erythema.  There is no right axillary adenopathy.  Left breast is retracted from radiation and surgery.  There is some hyperpigmentation of the left breast.  She has the lumpectomy site has well-healed.  She has little bit of radiation telangiectasias at the biopsy site.  There is no left axillary adenopathy.  HENT:     Head: Normocephalic and atraumatic.  Eyes:     Pupils: Pupils are equal, round, and reactive to light.  Neck:     Comments: Neck exam shows some fullness in the left neck.  There is a mass that is palpable.  It is not all that mobile.  It probably measures about 4 x 4 cm.  Right neck is unremarkable. Cardiovascular:     Rate and Rhythm: Normal rate and regular rhythm.     Heart sounds: Normal heart  sounds.  Pulmonary:     Effort: Pulmonary effort is normal.     Breath sounds: Normal breath sounds.  Abdominal:     General: Bowel sounds are normal.     Palpations: Abdomen is soft.  Musculoskeletal:        General: No tenderness or deformity. Normal range of motion.     Cervical back: Normal range of motion.  Lymphadenopathy:     Cervical: No cervical adenopathy.  Skin:    General: Skin is warm and dry.     Findings: No erythema or rash.  Neurological:  Mental Status: She is alert and oriented to person, place, and time.  Psychiatric:        Behavior: Behavior normal.        Thought Content: Thought content normal.        Judgment: Judgment normal.     Lab Results  Component Value Date   WBC 7.3 11/06/2021   HGB 12.4 11/06/2021   HCT 36.5 11/06/2021   MCV 86.1 11/06/2021   PLT 397 11/06/2021     Chemistry      Component Value Date/Time   NA 139 11/06/2021 1035   K 4.2 11/06/2021 1035   CL 106 11/06/2021 1035   CO2 24 11/06/2021 1035   BUN 10 11/06/2021 1035   CREATININE 0.79 11/06/2021 1035      Component Value Date/Time   CALCIUM 9.8 11/06/2021 1035   ALKPHOS 89 11/06/2021 1035   AST 16 11/06/2021 1035   ALT 24 11/06/2021 1035   BILITOT 0.3 11/06/2021 1035      Impression and Plan: Ariana Herrera is a very charming 56 year old white female.  Looks like she has recurrent ductal carcinoma of the left breast.  Looks like the recurrence is a little more extensive than I would have liked.  Again we will try her on systemic chemotherapy.  We will also add immunotherapy.  Given that she is triple negative, I think that carbo/Gemzar/Keytruda would really be reasonable.  I think she could tolerate this.  We could use the CA 27.29 to see how well she responds.  Again went over toxicity.  I do not think that we will have to transfuse her.  I told her about the diarrhea and skin rash along with hypothyroidism along with possibility of liver issues with  immunotherapy.  Again we will try to get treatment started this week.  I would do 2 cycles of treatment and then we can repeat the scan and see how everything looks.    I will go ahead and plan to see her back when she starts her second cycle of treatment.   Volanda Napoleon, MD 1/30/202312:44 PM

## 2021-11-23 NOTE — Progress Notes (Signed)
Plan to start treatment this week. Will wait for chemo authorization and then schedule treatment and chemo ed.   Oncology Nurse Navigator Documentation  Oncology Nurse Navigator Flowsheets 11/23/2021  Confirmed Diagnosis Date -  Diagnosis Status -  Navigator Follow Up Date: 11/24/2021  Navigator Follow Up Reason: Appointment Review  Navigator Location [facility]  Navigator Encounter Type Follow-up Appt;Appt/Treatment Plan Review  Telephone -  Patient Visit Type MedOnc  Treatment Phase Pre-Tx/Tx Discussion  Barriers/Navigation Needs Coordination of Care  Education -  Interventions Coordination of Care  Acuity Level 2-Minimal Needs (1-2 Barriers Identified)  Referrals -  Coordination of Care -  Education Method -  Support Groups/Services Friends and Family  Time Spent with Patient 15

## 2021-11-23 NOTE — Progress Notes (Signed)
START ON PATHWAY REGIMEN - Breast     A cycle is every 21 days:     Pembrolizumab      Carboplatin      Gemcitabine   **Always confirm dose/schedule in your pharmacy ordering system**  Patient Characteristics: Distant Metastases or Locoregional Recurrent Disease - Unresected or Locally Advanced Unresectable Disease Progressing after Neoadjuvant and Local Therapies, HER2 Low/Negative/Unknown, ER Negative/Unknown, Chemotherapy, HER2 Negative/Unknown, First Line,  ER Negative, PD-L1 Expression Positive by CPS ?10 Therapeutic Status: Distant Metastases HER2 Status: Negative (-) ER Status: Negative (-) PR Status: Negative (-) Therapy Approach Indicated: Standard Chemotherapy/Endocrine Therapy Line of Therapy: First Line PD-L1 Expression Status: PD-L1 Expression Positive by CPS ?10 Intent of Therapy: Non-Curative / Palliative Intent, Discussed with Patient

## 2021-11-24 ENCOUNTER — Encounter: Payer: Self-pay | Admitting: *Deleted

## 2021-11-24 NOTE — Progress Notes (Signed)
Patient's chemo regimen authorized. Scheduled to start 11/30/2021. Patient will also need chemo education. Will schedule during her appointment on Monday.   Spoke with patient. She is aware of her appointments on Monday. She has had chemo in the past, but is aware that she will be provided additional education at her appointment.  She has no further questions right now about her regimen.   Patient filled her MSContin and has started it, but she still requires her Norco. Informed her that we would like for her to be managed with her MSContin but that it takes a few days for levels to become therapeutic. She will continue Norco and advise [place] of her pain management when she's here on Monday.   Oncology Nurse Navigator Documentation  Oncology Nurse Navigator Flowsheets 11/24/2021  Confirmed Diagnosis Date -  Diagnosis Status -  Planned Course of Treatment Chemotherapy  Phase of Treatment Chemo  Navigator Follow Up Date: 11/30/2021  Navigator Follow Up Reason: Chemotherapy  Navigator Location [facility]  Navigator Encounter Type Appt/Treatment Plan Review;Telephone  Telephone Outgoing Call  Patient Visit Type MedOnc  Treatment Phase Pre-Tx/Tx Discussion  Barriers/Navigation Needs Coordination of Care  Education Other  Interventions Coordination of Care;Education;Psycho-Social Support  Acuity Level 2-Minimal Needs (1-2 Barriers Identified)  Referrals -  Coordination of Care Appts  Education Method Verbal  Support Groups/Services Friends and Family  Time Spent with Patient 30

## 2021-11-25 NOTE — Progress Notes (Signed)
Pharmacist Chemotherapy Monitoring - Initial Assessment    Anticipated start date: 11-30-21   The following has been reviewed per standard work regarding the patient's treatment regimen: The patient's diagnosis, treatment plan and drug doses, and organ/hematologic function Lab orders and baseline tests specific to treatment regimen  The treatment plan start date, drug sequencing, and pre-medications Prior authorization status  Patient's documented medication list, including drug-drug interaction screen and prescriptions for anti-emetics and supportive care specific to the treatment regimen The drug concentrations, fluid compatibility, administration routes, and timing of the medications to be used The patient's access for treatment and lifetime cumulative dose history, if applicable  The patient's medication allergies and previous infusion related reactions, if applicable   Changes made to treatment plan:  treatment plan date  Follow up needed:  N/A   Judge Stall, [place], 11/25/2021  3:09 PM

## 2021-11-26 ENCOUNTER — Other Ambulatory Visit: Payer: Self-pay | Admitting: *Deleted

## 2021-11-26 DIAGNOSIS — C50011 Malignant neoplasm of nipple and areola, right female breast: Secondary | ICD-10-CM

## 2021-11-26 MED ORDER — LIDOCAINE-PRILOCAINE 2.5-2.5 % EX CREA: TOPICAL_CREAM | CUTANEOUS | 3 refills | Status: DC

## 2021-11-26 MED ORDER — ONDANSETRON HCL 8 MG PO TABS: 8.0000 mg | ORAL_TABLET | Freq: Two times a day (BID) | ORAL | 1 refills | Status: DC | PRN

## 2021-11-26 MED ORDER — PROCHLORPERAZINE MALEATE 10 MG PO TABS: 10.0000 mg | ORAL_TABLET | Freq: Four times a day (QID) | ORAL | 1 refills | Status: DC | PRN

## 2021-11-26 MED ORDER — DEXAMETHASONE 4 MG PO TABS: 8.0000 mg | ORAL_TABLET | Freq: Every day | ORAL | 1 refills | Status: DC

## 2021-11-30 ENCOUNTER — Inpatient Hospital Stay: Payer: Medicaid Other | Attending: Hematology & Oncology

## 2021-11-30 ENCOUNTER — Other Ambulatory Visit: Payer: Self-pay

## 2021-11-30 ENCOUNTER — Inpatient Hospital Stay: Payer: Medicaid Other

## 2021-11-30 VITALS — BP 136/61 | HR 98 | Temp 98.5°F | Resp 18

## 2021-11-30 DIAGNOSIS — C50912 Malignant neoplasm of unspecified site of left female breast: Secondary | ICD-10-CM | POA: Insufficient documentation

## 2021-11-30 DIAGNOSIS — Z171 Estrogen receptor negative status [ER-]: Secondary | ICD-10-CM | POA: Insufficient documentation

## 2021-11-30 DIAGNOSIS — C50011 Malignant neoplasm of nipple and areola, right female breast: Secondary | ICD-10-CM

## 2021-11-30 DIAGNOSIS — Z5112 Encounter for antineoplastic immunotherapy: Secondary | ICD-10-CM | POA: Insufficient documentation

## 2021-11-30 DIAGNOSIS — Z5111 Encounter for antineoplastic chemotherapy: Secondary | ICD-10-CM | POA: Insufficient documentation

## 2021-11-30 DIAGNOSIS — C7989 Secondary malignant neoplasm of other specified sites: Secondary | ICD-10-CM | POA: Insufficient documentation

## 2021-11-30 DIAGNOSIS — Z79899 Other long term (current) drug therapy: Secondary | ICD-10-CM | POA: Diagnosis not present

## 2021-11-30 LAB — CBC WITH DIFFERENTIAL (CANCER CENTER ONLY)
Abs Immature Granulocytes: 0.04 10*3/uL (ref 0.00–0.07)
Basophils Absolute: 0.1 10*3/uL (ref 0.0–0.1)
Basophils Relative: 1 %
Eosinophils Absolute: 0.1 10*3/uL (ref 0.0–0.5)
Eosinophils Relative: 2 %
HCT: 36.8 % (ref 36.0–46.0)
Hemoglobin: 12.3 g/dL (ref 12.0–15.0)
Immature Granulocytes: 1 %
Lymphocytes Relative: 28 %
Lymphs Abs: 2.3 10*3/uL (ref 0.7–4.0)
MCH: 29.1 pg (ref 26.0–34.0)
MCHC: 33.4 g/dL (ref 30.0–36.0)
MCV: 87 fL (ref 80.0–100.0)
Monocytes Absolute: 0.7 10*3/uL (ref 0.1–1.0)
Monocytes Relative: 8 %
Neutro Abs: 4.9 10*3/uL (ref 1.7–7.7)
Neutrophils Relative %: 60 %
Platelet Count: 361 10*3/uL (ref 150–400)
RBC: 4.23 MIL/uL (ref 3.87–5.11)
RDW: 14.5 % (ref 11.5–15.5)
WBC Count: 8.1 10*3/uL (ref 4.0–10.5)
nRBC: 0 % (ref 0.0–0.2)

## 2021-11-30 LAB — CMP (CANCER CENTER ONLY)
ALT: 17 U/L (ref 0–44)
AST: 13 U/L — ABNORMAL LOW (ref 15–41)
Albumin: 3.8 g/dL (ref 3.5–5.0)
Alkaline Phosphatase: 80 U/L (ref 38–126)
Anion gap: 8 (ref 5–15)
BUN: 13 mg/dL (ref 6–20)
CO2: 23 mmol/L (ref 22–32)
Calcium: 9 mg/dL (ref 8.9–10.3)
Chloride: 103 mmol/L (ref 98–111)
Creatinine: 0.75 mg/dL (ref 0.44–1.00)
GFR, Estimated: >60 mL/min (ref >60)
Glucose, Bld: 180 mg/dL — ABNORMAL HIGH (ref 70–99)
Potassium: 3.7 mmol/L (ref 3.5–5.1)
Sodium: 134 mmol/L — ABNORMAL LOW (ref 135–145)
Total Bilirubin: 0.3 mg/dL (ref 0.3–1.2)
Total Protein: 6.4 g/dL — ABNORMAL LOW (ref 6.5–8.1)

## 2021-11-30 MED ORDER — SODIUM CHLORIDE 0.9 % IV SOLN
273.6000 mg | Freq: Once | INTRAVENOUS | Status: AC
  Administered 2021-11-30: 270 mg via INTRAVENOUS
  Filled 2021-11-30: qty 27

## 2021-11-30 MED ORDER — SODIUM CHLORIDE 0.9 % IV SOLN
200.0000 mg | Freq: Once | INTRAVENOUS | Status: AC
  Administered 2021-11-30: 200 mg via INTRAVENOUS
  Filled 2021-11-30: qty 8

## 2021-11-30 MED ORDER — SODIUM CHLORIDE 0.9 % IV SOLN
1000.0000 mg/m2 | Freq: Once | INTRAVENOUS | Status: AC
  Administered 2021-11-30: 1938 mg via INTRAVENOUS
  Filled 2021-11-30: qty 50.97

## 2021-11-30 MED ORDER — PALONOSETRON HCL INJECTION 0.25 MG/5ML
0.2500 mg | Freq: Once | INTRAVENOUS | Status: AC
  Administered 2021-11-30: 0.25 mg via INTRAVENOUS
  Filled 2021-11-30: qty 5

## 2021-11-30 MED ORDER — SODIUM CHLORIDE 0.9% FLUSH: 10.0000 mL | INTRAVENOUS | Status: DC | PRN

## 2021-11-30 MED ORDER — SODIUM CHLORIDE 0.9 % IV SOLN: Freq: Once | INTRAVENOUS | Status: DC

## 2021-11-30 MED ORDER — HEPARIN SOD (PORK) LOCK FLUSH 100 UNIT/ML IV SOLN: 500.0000 [IU] | Freq: Once | INTRAVENOUS | Status: DC | PRN

## 2021-11-30 MED ORDER — SODIUM CHLORIDE 0.9 % IV SOLN
10.0000 mg | Freq: Once | INTRAVENOUS | Status: AC
  Administered 2021-11-30: 10 mg via INTRAVENOUS
  Filled 2021-11-30: qty 10

## 2021-11-30 MED ORDER — SODIUM CHLORIDE 0.9 % IV SOLN: Freq: Once | INTRAVENOUS | Status: AC

## 2021-11-30 MED ORDER — HYDROCODONE-ACETAMINOPHEN 5-325 MG PO TABS
1.0000 | ORAL_TABLET | ORAL | 0 refills | Status: DC | PRN
Start: 2021-11-30 — End: 2021-12-01

## 2021-11-30 NOTE — Patient Instructions (Signed)
[facility]  Discharge Instructions: Thank you for choosing [facility] to provide your oncology and hematology care.   If you have a lab appointment with the [facility], please go directly to the [facility] and check in at the registration area.  Wear comfortable clothing and clothing appropriate for easy access to any Portacath or PICC line.   We strive to give you quality time with your provider. You may need to reschedule your appointment if you arrive late (15 or more minutes).  Arriving late affects you and other patients whose appointments are after yours.  Also, if you miss three or more appointments without notifying the office, you may be dismissed from the clinic at the providers discretion.      For prescription refill requests, have your pharmacy contact our office and allow 72 hours for refills to be completed.    Today you received the following chemotherapy and/or immunotherapy agents Keytruda, Gemzar and Carboplatin      To help prevent nausea and vomiting after your treatment, we encourage you to take your nausea medication as directed.  BELOW ARE SYMPTOMS THAT SHOULD BE REPORTED IMMEDIATELY: *FEVER GREATER THAN 100.4 F (38 C) OR HIGHER *CHILLS OR SWEATING *NAUSEA AND VOMITING THAT IS NOT CONTROLLED WITH YOUR NAUSEA MEDICATION *UNUSUAL SHORTNESS OF BREATH *UNUSUAL BRUISING OR BLEEDING *URINARY PROBLEMS (pain or burning when urinating, or frequent urination) *BOWEL PROBLEMS (unusual diarrhea, constipation, pain near the anus) TENDERNESS IN MOUTH AND THROAT WITH OR WITHOUT PRESENCE OF ULCERS (sore throat, sores in mouth, or a toothache) UNUSUAL RASH, SWELLING OR PAIN  UNUSUAL VAGINAL DISCHARGE OR ITCHING   Items with * indicate a potential emergency and should be followed up as soon as possible or go to the Emergency Department if any problems should occur.  Please show the CHEMOTHERAPY ALERT CARD or IMMUNOTHERAPY ALERT  CARD at check-in to the Emergency Department and triage nurse. Should you have questions after your visit or need to cancel or reschedule your appointment, please contact [facility]  [phone] and follow the prompts.  Office hours are 8:00 a.m. to 4:30 p.m. Monday - Friday. Please note that voicemails left after 4:00 p.m. may not be returned until the following business day.  We are closed weekends and major holidays. You have access to a nurse at all times for urgent questions. Please call the main number to the clinic [phone] and follow the prompts.  For any non-urgent questions, you may also contact your provider using MyChart. We now offer e-Visits for anyone 54 and older to request care online for non-urgent symptoms. For details visit [email or web address].   Also download the MyChart app! Go to the app store, search "MyChart", open the app, select [facility], and log in with your MyChart username and password.  Due to Covid, a mask is required upon entering the hospital/clinic. If you do not have a mask, one will be given to you upon arrival. For doctor visits, patients may have 1 support person aged 54 or older with them. For treatment visits, patients cannot have anyone with them due to current Covid guidelines and our immunocompromised population.

## 2021-11-30 NOTE — Progress Notes (Signed)
Patient in chemotherapy education class with fiance. Discussed side effects of keytruda, carboplatin and gemzar which include but are not limited to myelosuppression, decreased appetitte, fatigue, fever, allergic or infusional reaction, mucositis, cardiac toxicity, cough, SOB, altered taste, nausea and vomiting, diarrhea, constipation, elevated LFTs, myalgia and arthralgias, hair loss or thinning, rash, skin dryness, nail changes, peripheral neuropathy, discolored urine, delayed wound healing, mental changes (chemo brain), increased risk of infection, and weight loss.  Reviewed infusion room and office policy and procedure and phone numbers 24 hours x 7 days a week. Reviewed when to call the office with any concerns or problems. Scientist, clinical (histocompatibility and immunogenetics) given. Discussed portacath insertion and EMLA cream administration. Antiemetic protocol and chemotherapy schedule reviewed. Patient verbalized understanding of chemotherapy indications and possible side effects. Pt demonstrated teachback.

## 2021-12-01 ENCOUNTER — Encounter: Payer: Self-pay | Admitting: Hematology & Oncology

## 2021-12-01 ENCOUNTER — Encounter: Payer: Self-pay | Admitting: *Deleted

## 2021-12-01 ENCOUNTER — Other Ambulatory Visit: Payer: Self-pay

## 2021-12-01 ENCOUNTER — Other Ambulatory Visit (HOSPITAL_BASED_OUTPATIENT_CLINIC_OR_DEPARTMENT_OTHER): Payer: Self-pay

## 2021-12-01 DIAGNOSIS — C50011 Malignant neoplasm of nipple and areola, right female breast: Secondary | ICD-10-CM

## 2021-12-01 DIAGNOSIS — C50919 Malignant neoplasm of unspecified site of unspecified female breast: Secondary | ICD-10-CM

## 2021-12-01 LAB — T4: T4, Total: 3.8 ug/dL — ABNORMAL LOW (ref 4.5–12.0)

## 2021-12-01 LAB — TSH: TSH: 1.557 u[IU]/mL (ref 0.308–3.960)

## 2021-12-01 MED ORDER — HYDROCODONE-ACETAMINOPHEN 5-325 MG PO TABS
1.0000 | ORAL_TABLET | ORAL | 0 refills | Status: DC | PRN
  Filled 2021-12-01: qty 90, 15d supply, fill #0

## 2021-12-01 NOTE — Progress Notes (Signed)
Patient had her first treatment yesterday. Called to get update on how she was doing. She stated she slept well and has no complaints. She denies n/v but does have her prn medications at home and understands how to use them. She states she is drinking well and eating at baseline.   Also spoke to her about a port. She had mentioned interest to the infusion nurses during her treatment. I spoke to Dr Marin Olp and he okay with getting a port placed. Order placed. Patient is aware that it's unlikely that the port will be placed before day 8 of her current cycle, but it should be placed before she starts cycle 2. She is in agreement.   Jackelyn Poling knows to call with any questions or concerns.   Oncology Nurse Navigator Documentation  Oncology Nurse Navigator Flowsheets 12/01/2021  Confirmed Diagnosis Date -  Diagnosis Status -  Planned Course of Treatment -  Phase of Treatment Chemo  Chemotherapy Actual Start Date: 11/30/2021  Chemotherapy Expected End Date: 02/08/2022  Navigator Follow Up Date: 12/21/2021  Navigator Follow Up Reason: Follow-up Appointment;Chemotherapy  Navigator Restaurant manager, fast food Encounter Type Telephone  Telephone Patient Update;Outgoing Call  Patient Visit Type MedOnc  Treatment Phase Pre-Tx/Tx Discussion  Barriers/Navigation Needs Coordination of Care;Education  Education Pain/ Symptom Management;Other  Interventions Coordination of Care;Education;Psycho-Social Support  Acuity Level 2-Minimal Needs (1-2 Barriers Identified)  Referrals -  Coordination of Care Radiology  Education Method Verbal  Support Groups/Services Friends and Family  Time Spent with Patient 47

## 2021-12-01 NOTE — Telephone Encounter (Signed)
Pt called stating [facility] was unable to get her hydrocodone, that it was on back order and request to get it through [facility], she called and confirmed her insurance through them as well.  Called and cancelled rx through [facility] and resent to [employer]

## 2021-12-03 ENCOUNTER — Encounter: Payer: Self-pay | Admitting: *Deleted

## 2021-12-03 NOTE — Progress Notes (Signed)
Patient calling with c/o sore throat last 2 days. Denies any other symptoms including congestion and/or fever.   Instructed patient to use home and over the counter remedies for sore throat. Told her call us back if she were to develop a fever. She understands.   Oncology Nurse Navigator Documentation  Oncology Nurse Navigator Flowsheets 12/03/2021  Confirmed Diagnosis Date -  Diagnosis Status -  Planned Course of Treatment -  Phase of Treatment -  Chemotherapy Actual Start Date: -  Chemotherapy Expected End Date: -  Navigator Follow Up Date: 12/21/2021  Navigator Follow Up Reason: Follow-up Appointment;Chemotherapy  Production assistant, radio Encounter Type Telephone  Telephone Symptom Mgt;Incoming Call  Patient Visit Type MedOnc  Treatment Phase Active Tx  Barriers/Navigation Needs Coordination of Care;Education  Education Pain/ Symptom Management  Interventions Education;Psycho-Social Support  Acuity Level 2-Minimal Needs (1-2 Barriers Identified)  Referrals -  Coordination of Care -  Education Method Verbal  Support Groups/Services Friends and Family  Time Spent with Patient 15

## 2021-12-07 ENCOUNTER — Inpatient Hospital Stay: Payer: Medicaid Other

## 2021-12-07 ENCOUNTER — Other Ambulatory Visit: Payer: Self-pay

## 2021-12-07 VITALS — BP 130/66 | HR 96 | Temp 98.1°F | Resp 18

## 2021-12-07 DIAGNOSIS — C50011 Malignant neoplasm of nipple and areola, right female breast: Secondary | ICD-10-CM

## 2021-12-07 DIAGNOSIS — Z5112 Encounter for antineoplastic immunotherapy: Secondary | ICD-10-CM | POA: Diagnosis not present

## 2021-12-07 LAB — CMP (CANCER CENTER ONLY)
ALT: 68 U/L — ABNORMAL HIGH (ref 0–44)
AST: 37 U/L (ref 15–41)
Albumin: 3.7 g/dL (ref 3.5–5.0)
Alkaline Phosphatase: 87 U/L (ref 38–126)
Anion gap: 7 (ref 5–15)
BUN: 9 mg/dL (ref 6–20)
CO2: 24 mmol/L (ref 22–32)
Calcium: 8.7 mg/dL — ABNORMAL LOW (ref 8.9–10.3)
Chloride: 106 mmol/L (ref 98–111)
Creatinine: 0.77 mg/dL (ref 0.44–1.00)
GFR, Estimated: >60 mL/min (ref >60)
Glucose, Bld: 83 mg/dL (ref 70–99)
Potassium: 4 mmol/L (ref 3.5–5.1)
Sodium: 137 mmol/L (ref 135–145)
Total Bilirubin: 0.3 mg/dL (ref 0.3–1.2)
Total Protein: 6.5 g/dL (ref 6.5–8.1)

## 2021-12-07 LAB — CBC WITH DIFFERENTIAL (CANCER CENTER ONLY)
Abs Immature Granulocytes: 0.03 10*3/uL (ref 0.00–0.07)
Basophils Absolute: 0 10*3/uL (ref 0.0–0.1)
Basophils Relative: 1 %
Eosinophils Absolute: 0 10*3/uL (ref 0.0–0.5)
Eosinophils Relative: 0 %
HCT: 35.3 % — ABNORMAL LOW (ref 36.0–46.0)
Hemoglobin: 11.7 g/dL — ABNORMAL LOW (ref 12.0–15.0)
Immature Granulocytes: 1 %
Lymphocytes Relative: 21 %
Lymphs Abs: 1.1 10*3/uL (ref 0.7–4.0)
MCH: 29 pg (ref 26.0–34.0)
MCHC: 33.1 g/dL (ref 30.0–36.0)
MCV: 87.6 fL (ref 80.0–100.0)
Monocytes Absolute: 0.3 10*3/uL (ref 0.1–1.0)
Monocytes Relative: 5 %
Neutro Abs: 3.8 10*3/uL (ref 1.7–7.7)
Neutrophils Relative %: 72 %
Platelet Count: 240 10*3/uL (ref 150–400)
RBC: 4.03 MIL/uL (ref 3.87–5.11)
RDW: 14.3 % (ref 11.5–15.5)
WBC Count: 5.2 10*3/uL (ref 4.0–10.5)
nRBC: 0.4 % — ABNORMAL HIGH (ref 0.0–0.2)

## 2021-12-07 MED ORDER — SODIUM CHLORIDE 0.9 % IV SOLN
1000.0000 mg/m2 | Freq: Once | INTRAVENOUS | Status: AC
  Administered 2021-12-07: 1938 mg via INTRAVENOUS
  Filled 2021-12-07: qty 50.97

## 2021-12-07 MED ORDER — PALONOSETRON HCL INJECTION 0.25 MG/5ML
0.2500 mg | Freq: Once | INTRAVENOUS | Status: AC
  Administered 2021-12-07: 0.25 mg via INTRAVENOUS
  Filled 2021-12-07: qty 5

## 2021-12-07 MED ORDER — SODIUM CHLORIDE 0.9 % IV SOLN: Freq: Once | INTRAVENOUS | Status: AC

## 2021-12-07 MED ORDER — SODIUM CHLORIDE 0.9 % IV SOLN
273.6000 mg | Freq: Once | INTRAVENOUS | Status: AC
  Administered 2021-12-07: 270 mg via INTRAVENOUS
  Filled 2021-12-07: qty 27

## 2021-12-07 MED ORDER — SODIUM CHLORIDE 0.9 % IV SOLN
10.0000 mg | Freq: Once | INTRAVENOUS | Status: AC
  Administered 2021-12-07: 10 mg via INTRAVENOUS
  Filled 2021-12-07: qty 10

## 2021-12-07 MED ORDER — SODIUM CHLORIDE 0.9 % IV SOLN: Freq: Once | INTRAVENOUS | Status: DC

## 2021-12-08 ENCOUNTER — Other Ambulatory Visit: Payer: Medicaid Other

## 2021-12-08 LAB — TSH: TSH: 1.472 u[IU]/mL (ref 0.308–3.960)

## 2021-12-08 LAB — T4: T4, Total: 3.7 ug/dL — ABNORMAL LOW (ref 4.5–12.0)

## 2021-12-14 ENCOUNTER — Other Ambulatory Visit: Payer: Self-pay | Admitting: Radiology

## 2021-12-14 ENCOUNTER — Other Ambulatory Visit (HOSPITAL_COMMUNITY): Payer: Self-pay | Admitting: Physician Assistant

## 2021-12-15 ENCOUNTER — Other Ambulatory Visit: Payer: Self-pay | Admitting: Hematology & Oncology

## 2021-12-15 ENCOUNTER — Ambulatory Visit (HOSPITAL_COMMUNITY)
Admission: RE | Admit: 2021-12-15 | Discharge: 2021-12-15 | Disposition: A | Discharge status: Home / Self Care | Payer: Medicaid Other | Source: Ambulatory Visit | Attending: Hematology & Oncology | Admitting: Hematology & Oncology

## 2021-12-15 ENCOUNTER — Encounter (HOSPITAL_COMMUNITY): Payer: Self-pay

## 2021-12-15 ENCOUNTER — Other Ambulatory Visit: Payer: Self-pay

## 2021-12-15 DIAGNOSIS — C50011 Malignant neoplasm of nipple and areola, right female breast: Secondary | ICD-10-CM | POA: Insufficient documentation

## 2021-12-15 DIAGNOSIS — C50919 Malignant neoplasm of unspecified site of unspecified female breast: Secondary | ICD-10-CM

## 2021-12-15 DIAGNOSIS — I2510 Atherosclerotic heart disease of native coronary artery without angina pectoris: Secondary | ICD-10-CM | POA: Insufficient documentation

## 2021-12-15 DIAGNOSIS — E119 Type 2 diabetes mellitus without complications: Secondary | ICD-10-CM | POA: Diagnosis not present

## 2021-12-15 DIAGNOSIS — Z794 Long term (current) use of insulin: Secondary | ICD-10-CM | POA: Insufficient documentation

## 2021-12-15 DIAGNOSIS — I252 Old myocardial infarction: Secondary | ICD-10-CM | POA: Diagnosis not present

## 2021-12-15 DIAGNOSIS — Z7984 Long term (current) use of oral hypoglycemic drugs: Secondary | ICD-10-CM | POA: Insufficient documentation

## 2021-12-15 DIAGNOSIS — F319 Bipolar disorder, unspecified: Secondary | ICD-10-CM | POA: Diagnosis not present

## 2021-12-15 HISTORY — PX: IR IMAGING GUIDED PORT INSERTION: IMG5740

## 2021-12-15 LAB — GLUCOSE, CAPILLARY: Glucose-Capillary: 171 mg/dL — ABNORMAL HIGH (ref 70–99)

## 2021-12-15 MED ORDER — SODIUM CHLORIDE 0.9 % IV SOLN: INTRAVENOUS | Status: DC

## 2021-12-15 MED ORDER — FENTANYL CITRATE (PF) 100 MCG/2ML IJ SOLN
INTRAMUSCULAR | Status: AC | PRN
  Administered 2021-12-15: 50 ug via INTRAVENOUS

## 2021-12-15 MED ORDER — LIDOCAINE-EPINEPHRINE 1 %-1:100000 IJ SOLN
INTRAMUSCULAR | Status: AC
  Filled 2021-12-15: qty 1

## 2021-12-15 MED ORDER — LIDOCAINE-EPINEPHRINE 1 %-1:100000 IJ SOLN
INTRAMUSCULAR | Status: AC | PRN
  Administered 2021-12-15: 10 mL via INTRADERMAL

## 2021-12-15 MED ORDER — HEPARIN SOD (PORK) LOCK FLUSH 100 UNIT/ML IV SOLN
INTRAVENOUS | Status: AC
  Filled 2021-12-15: qty 5

## 2021-12-15 MED ORDER — FENTANYL CITRATE (PF) 100 MCG/2ML IJ SOLN
INTRAMUSCULAR | Status: AC
  Filled 2021-12-15: qty 2

## 2021-12-15 MED ORDER — FENTANYL CITRATE (PF) 100 MCG/2ML IJ SOLN
INTRAMUSCULAR | Status: AC | PRN
Start: 2021-12-15 — End: 2021-12-15
  Administered 2021-12-15: 50 ug via INTRAVENOUS

## 2021-12-15 MED ORDER — MIDAZOLAM HCL 2 MG/2ML IJ SOLN
INTRAMUSCULAR | Status: AC
  Filled 2021-12-15: qty 4

## 2021-12-15 MED ORDER — MIDAZOLAM HCL 2 MG/2ML IJ SOLN
INTRAMUSCULAR | Status: AC | PRN
  Administered 2021-12-15: 1 mg via INTRAVENOUS

## 2021-12-15 MED ORDER — HEPARIN SOD (PORK) LOCK FLUSH 100 UNIT/ML IV SOLN
INTRAVENOUS | Status: AC | PRN
  Administered 2021-12-15: 500 [IU] via INTRAVENOUS

## 2021-12-15 NOTE — H&P (Signed)
Referring Physician(s): Ennever,Peter R  Supervising Physician: Michaelle Birks  Patient Status:  [facility]  Chief Complaint:  "I'm getting a port a cath"  Subjective: Patient known to IR service from Port-A-Cath removal in 2021, and left supraclavicular mass biopsy on 11/02/2021.  She has a history of left breast carcinoma diagnosed in 2019 with prior lumpectomy, chemotherapy and radiation therapy, now with evidence of recurrence.She presents today for Port-A-Cath placement for chemo/immunotherapy. She denies fever,HA,CP,dyspnea, cough, abd/back pain, or bleeding. She has had some recent nausea.  Past Medical History:  Diagnosis Date   Anginal pain ([place])    Asthma    Bipolar 1 disorder ([place])    Bipolar disease, chronic ([place])    Breast cancer in female ([place])    2019 and recurrent in 2023 to right breast, right axillary, and left neck   Coronary artery disease    Deep vein blood clot of right lower extremity ([place])    Diabetes mellitus type 2 in obese ([place])    Goals of care, counseling/discussion 11/23/2021   Hyperlipidemia    MI (myocardial infarction) ([place])    was in [place]   Obesity    Personal history of chemotherapy    Personal history of radiation therapy    Past Surgical History:  Procedure Laterality Date   BREAST LUMPECTOMY Left 03/2019   IR REMOVAL TUN ACCESS W/ PORT W/O FL MOD SED  12/20/2019   IR US GUIDE BX ASP/DRAIN  11/02/2021   LEFT HEART CATH AND CORONARY ANGIOGRAPHY N/A 08/22/2020   Procedure: LEFT HEART CATH AND CORONARY ANGIOGRAPHY;  Surgeon: Burnell Blanks, MD;  Location: [facility];  Service: Cardiovascular;  Laterality: N/A;       Allergies: Lithium, Trulicity [dulaglutide], and Penicillins  Medications: Prior to Admission medications   Medication Sig Start Date End Date Taking? Authorizing Provider  albuterol (VENTOLIN HFA) 108 (90 Base) MCG/ACT inhaler Inhale 2 puffs into the lungs every 6 (six) hours as needed for wheezing or  shortness of breath.   Yes [provider]  HYDROcodone-acetaminophen (NORCO/VICODIN) 5-325 MG tablet Take 1 tablet by mouth every 4 (four) hours as needed for moderate pain. 12/01/21  Yes Ennever, Rudell Cobb, MD  insulin aspart (NOVOLOG FLEXPEN) 100 UNIT/ML FlexPen Inject 3 Units into the skin 3 (three) times daily with meals. Patient taking differently: Inject 10-16 Units into the skin 3 (three) times daily with meals. 08/25/20  Yes Debbe Odea, MD  losartan (COZAAR) 25 MG tablet Take 1 tablet (25 mg total) by mouth daily. Patient taking differently: Take 12.5 mg by mouth daily. 08/26/20  Yes Debbe Odea, MD  metFORMIN (GLUCOPHAGE) 1000 MG tablet Take 1,000 mg by mouth 2 (two) times daily with a meal. 11/26/19  Yes [provider]  metoprolol succinate (TOPROL-XL) 25 MG 24 hr tablet TAKE 1 AND 1/2 TABLETS BY MOUTH EVERY DAY Patient taking differently: Take 25 mg by mouth daily. 09/22/21  Yes Hilty, Nadean Corwin, MD  morphine (MS CONTIN) 15 MG 12 hr tablet Take 1 tablet (15 mg total) by mouth every 12 (twelve) hours. 11/23/21  Yes Ennever, Rudell Cobb, MD  OLANZapine (ZYPREXA) 10 MG tablet Take 1 tablet (10 mg total) by mouth at bedtime. 11/23/21  Yes Ennever, Rudell Cobb, MD  omeprazole (PRILOSEC) 20 MG capsule Take 20 mg by mouth daily. 10/10/20  Yes [provider]  pravastatin (PRAVACHOL) 20 MG tablet Take 20 mg by mouth daily. 09/20/21  Yes [provider]  QUEtiapine (SEROQUEL) 400  MG tablet Take 800 mg by mouth at bedtime.   Yes [provider]  ACCU-CHEK GUIDE test strip 3 (three) times daily. 07/17/21   [provider]  Continuous Blood Gluc Sensor (FREESTYLE LIBRE 2 SENSOR) MISC Inject 1 sensor to the skin every 14 days for continuous glucose monitoring. 12/09/20   [provider]  fenofibrate (TRICOR) 145 MG tablet Take 145 mg by mouth daily. 11/22/21   [provider]  insulin glargine (LANTUS) 100 UNIT/ML injection Inject 25 Units into  the skin at bedtime.    [provider]  Insulin Pen Needle (B-D UF III MINI PEN NEEDLES) 31G X 5 MM MISC Use to administer insulin 4 times daily 11/07/20   [provider]  lidocaine-prilocaine (EMLA) cream Apply to affected area once 11/26/21   Ennever, Rudell Cobb, MD  nitroGLYCERIN (NITROSTAT) 0.4 MG SL tablet Place 1 tablet (0.4 mg total) under the tongue every 5 (five) minutes as needed for chest pain. Patient not taking: Reported on 11/06/2021 09/04/20   Deberah Pelton, NP  ondansetron (ZOFRAN) 8 MG tablet Take 1 tablet (8 mg total) by mouth 2 (two) times daily as needed for refractory nausea / vomiting. Start on day 3 after chemotherapy. 11/26/21   Volanda Napoleon, MD  Potassium 99 MG TABS Take 99 mg by mouth daily.    [provider]  prochlorperazine (COMPAZINE) 10 MG tablet Take 1 tablet (10 mg total) by mouth every 6 (six) hours as needed (Nausea or vomiting). 11/26/21   Volanda Napoleon, MD     Vital Signs: BP 132/71    Pulse 93    Temp 98.3 F (36.8 C) (Oral)    Resp 16    LMP 10/04/2016 Comment: after "procedure to stop bleeding"   SpO2 97%   Physical Exam awake, alert.  Chest with distant breath sounds bilaterally. Occ inspiratory wheeze.  Heart with regular rate and rhythm.  Abdomen soft, positive bowel sounds, nontender; left neck/nodal mass; trace pretibial edema  Imaging: No results found.  Labs:  CBC: Recent Labs    10/05/21 0828 11/06/21 1035 11/30/21 1054 12/07/21 1056  WBC 7.4 7.3 8.1 5.2  HGB 12.8 12.4 12.3 11.7*  HCT 37.9 36.5 36.8 35.3*  PLT 391 397 361 240    COAGS: No results for input(s): INR, APTT in the last 8760 hours.  BMP: Recent Labs    10/05/21 0828 11/06/21 1035 11/30/21 1054 12/07/21 1056  NA 138 139 134* 137  K 4.2 4.2 3.7 4.0  CL 103 106 103 106  CO2 27 24 23 24   GLUCOSE 164* 95 180* 83  BUN 11 10 13 9   CALCIUM 9.7 9.8 9.0 8.7*  CREATININE 0.81 0.79 0.75 0.77  GFRNONAA >60 >60 >60 >60    LIVER FUNCTION  TESTS: Recent Labs    10/05/21 0828 11/06/21 1035 11/30/21 1054 12/07/21 1056  BILITOT 0.3 0.3 0.3 0.3  AST 14* 16 13* 37  ALT 21 24 17  68*  ALKPHOS 70 89 80 87  PROT 6.8 6.7 6.4* 6.5  ALBUMIN 4.3 4.3 3.8 3.7    Assessment and Plan: Patient known to IR service from Port-A-Cath removal in 2021, and left supraclavicular mass biopsy on 11/02/2021.  She has a history of left breast carcinoma diagnosed in 2019 with prior lumpectomy, chemotherapy and radiation therapy, now with evidence of recurrence.She presents today for Port-A-Cath placement for chemo/immunotherapy. Risks and benefits of image guided port-a-catheter placement was discussed with the patient including, but not  limited to bleeding, infection, pneumothorax, or fibrin sheath development and need for additional procedures.  All of the patient's questions were answered, patient is agreeable to proceed. Consent signed and in chart.    Electronically Signed: D. Rowe Robert, PA-C 12/15/2021, 10:43 AM   I spent a total of 20 minutes at the the patient's bedside AND on the patient's hospital floor or unit, greater than 50% of which was counseling/coordinating care for port a cath placement

## 2021-12-15 NOTE — Procedures (Signed)
Vascular and Interventional Radiology Procedure Note  Patient: Ariana Herrera DOB: [birth year] Medical Record Number: [id number] Note Date/Time: 12/15/21 1:38 PM   Performing Physician: Michaelle Birks, MD Assistant(s): None  Diagnosis: Breast cancer  Procedure: PORT PLACEMENT  Anesthesia: Conscious Sedation Complications: None Estimated Blood Loss: Minimal  Findings:  Successful right-sided port placement, with the tip of the catheter in the proximal right atrium.  Plan: Catheter ready for use.  See detailed procedure note with images in PACS. The patient tolerated the procedure well without incident or complication and was returned to [facility] in stable condition.    Michaelle Birks, MD [facility] Radiology   Pager. [place]

## 2021-12-21 ENCOUNTER — Encounter: Payer: Self-pay | Admitting: Hematology & Oncology

## 2021-12-21 ENCOUNTER — Other Ambulatory Visit: Payer: Self-pay

## 2021-12-21 ENCOUNTER — Encounter: Payer: Self-pay | Admitting: *Deleted

## 2021-12-21 ENCOUNTER — Inpatient Hospital Stay: Payer: Medicaid Other

## 2021-12-21 ENCOUNTER — Other Ambulatory Visit (HOSPITAL_BASED_OUTPATIENT_CLINIC_OR_DEPARTMENT_OTHER): Payer: Self-pay

## 2021-12-21 ENCOUNTER — Inpatient Hospital Stay (HOSPITAL_BASED_OUTPATIENT_CLINIC_OR_DEPARTMENT_OTHER): Payer: Medicaid Other | Admitting: Hematology & Oncology

## 2021-12-21 DIAGNOSIS — Z5112 Encounter for antineoplastic immunotherapy: Secondary | ICD-10-CM | POA: Diagnosis not present

## 2021-12-21 DIAGNOSIS — C50011 Malignant neoplasm of nipple and areola, right female breast: Secondary | ICD-10-CM

## 2021-12-21 LAB — CBC WITH DIFFERENTIAL (CANCER CENTER ONLY)
Abs Immature Granulocytes: 0.01 10*3/uL (ref 0.00–0.07)
Basophils Absolute: 0 10*3/uL (ref 0.0–0.1)
Basophils Relative: 0 %
Eosinophils Absolute: 0.1 10*3/uL (ref 0.0–0.5)
Eosinophils Relative: 2 %
HCT: 32.7 % — ABNORMAL LOW (ref 36.0–46.0)
Hemoglobin: 11.1 g/dL — ABNORMAL LOW (ref 12.0–15.0)
Immature Granulocytes: 0 %
Lymphocytes Relative: 50 %
Lymphs Abs: 1.3 10*3/uL (ref 0.7–4.0)
MCH: 29.5 pg (ref 26.0–34.0)
MCHC: 33.9 g/dL (ref 30.0–36.0)
MCV: 87 fL (ref 80.0–100.0)
Monocytes Absolute: 0.4 10*3/uL (ref 0.1–1.0)
Monocytes Relative: 16 %
Neutro Abs: 0.8 10*3/uL — ABNORMAL LOW (ref 1.7–7.7)
Neutrophils Relative %: 32 %
Platelet Count: 183 10*3/uL (ref 150–400)
RBC: 3.76 MIL/uL — ABNORMAL LOW (ref 3.87–5.11)
RDW: 14.9 % (ref 11.5–15.5)
WBC Count: 2.6 10*3/uL — ABNORMAL LOW (ref 4.0–10.5)
nRBC: 0.8 % — ABNORMAL HIGH (ref 0.0–0.2)

## 2021-12-21 LAB — CMP (CANCER CENTER ONLY)
ALT: 54 U/L — ABNORMAL HIGH (ref 0–44)
AST: 30 U/L (ref 15–41)
Albumin: 3.8 g/dL (ref 3.5–5.0)
Alkaline Phosphatase: 98 U/L (ref 38–126)
Anion gap: 9 (ref 5–15)
BUN: 12 mg/dL (ref 6–20)
CO2: 26 mmol/L (ref 22–32)
Calcium: 8.9 mg/dL (ref 8.9–10.3)
Chloride: 106 mmol/L (ref 98–111)
Creatinine: 0.76 mg/dL (ref 0.44–1.00)
GFR, Estimated: >60 mL/min (ref >60)
Glucose, Bld: 182 mg/dL — ABNORMAL HIGH (ref 70–99)
Potassium: 4.2 mmol/L (ref 3.5–5.1)
Sodium: 141 mmol/L (ref 135–145)
Total Bilirubin: 0.3 mg/dL (ref 0.3–1.2)
Total Protein: 6.6 g/dL (ref 6.5–8.1)

## 2021-12-21 LAB — TSH: TSH: 2.218 u[IU]/mL (ref 0.308–3.960)

## 2021-12-21 MED ORDER — SODIUM CHLORIDE 0.9% FLUSH
10.0000 mL | INTRAVENOUS | Status: DC | PRN
  Administered 2021-12-21: 10 mL

## 2021-12-21 MED ORDER — MORPHINE SULFATE ER 15 MG PO TBCR
15.0000 mg | EXTENDED_RELEASE_TABLET | Freq: Two times a day (BID) | ORAL | 0 refills | Status: DC
  Filled 2021-12-21: qty 60, 30d supply, fill #0

## 2021-12-21 MED ORDER — SODIUM CHLORIDE 0.9 % IV SOLN
273.6000 mg | Freq: Once | INTRAVENOUS | Status: AC
  Administered 2021-12-21: 270 mg via INTRAVENOUS
  Filled 2021-12-21: qty 27

## 2021-12-21 MED ORDER — SODIUM CHLORIDE 0.9 % IV SOLN
200.0000 mg | Freq: Once | INTRAVENOUS | Status: AC
  Administered 2021-12-21: 200 mg via INTRAVENOUS
  Filled 2021-12-21: qty 8

## 2021-12-21 MED ORDER — SODIUM CHLORIDE 0.9 % IV SOLN
900.0000 mg/m2 | Freq: Once | INTRAVENOUS | Status: AC
  Administered 2021-12-21: 1748 mg via INTRAVENOUS
  Filled 2021-12-21: qty 26.3

## 2021-12-21 MED ORDER — HYDROCODONE-ACETAMINOPHEN 5-325 MG PO TABS
1.0000 | ORAL_TABLET | ORAL | 0 refills | Status: DC | PRN
  Filled 2021-12-21: qty 90, 15d supply, fill #0

## 2021-12-21 MED ORDER — HEPARIN SOD (PORK) LOCK FLUSH 100 UNIT/ML IV SOLN
500.0000 [IU] | Freq: Once | INTRAVENOUS | Status: AC | PRN
  Administered 2021-12-21: 500 [IU]

## 2021-12-21 MED ORDER — SODIUM CHLORIDE 0.9 % IV SOLN
10.0000 mg | Freq: Once | INTRAVENOUS | Status: AC
  Administered 2021-12-21: 10 mg via INTRAVENOUS
  Filled 2021-12-21: qty 10

## 2021-12-21 MED ORDER — PALONOSETRON HCL INJECTION 0.25 MG/5ML
0.2500 mg | Freq: Once | INTRAVENOUS | Status: AC
  Administered 2021-12-21: 0.25 mg via INTRAVENOUS
  Filled 2021-12-21: qty 5

## 2021-12-21 MED ORDER — SODIUM CHLORIDE 0.9 % IV SOLN: Freq: Once | INTRAVENOUS | Status: AC

## 2021-12-21 NOTE — Progress Notes (Signed)
Patient is here for cycle two. She states she did well after cycle one. She had mild diarrhea but otherwise no side effects. She has no concerns at this time. She confirms that she has her prn medication at home in case she needs it.   She will need scans after her next cycle.   Oncology Nurse Navigator Documentation  Oncology Nurse Navigator Flowsheets 12/21/2021  Confirmed Diagnosis Date -  Diagnosis Status -  Planned Course of Treatment -  Phase of Treatment -  Chemotherapy Actual Start Date: -  Chemotherapy Expected End Date: -  Navigator Follow Up Date: 01/11/2022  Navigator Follow Up Reason: Follow-up Appointment;Chemotherapy  Production assistant, radio Encounter Type Treatment  Telephone -  Patient Visit Type MedOnc  Treatment Phase Active Tx  Barriers/Navigation Needs Coordination of Care;Education  Education -  Interventions Psycho-Social Support  Acuity Level 2-Minimal Needs (1-2 Barriers Identified)  Referrals -  Coordination of Care -  Education Method -  Support Groups/Services Friends and Family  Time Spent with Patient 30

## 2021-12-21 NOTE — Patient Instructions (Signed)
[facility]  Discharge Instructions: Thank you for choosing [facility] to provide your oncology and hematology care.   If you have a lab appointment with the [facility], please go directly to the [facility] and check in at the registration area.  Wear comfortable clothing and clothing appropriate for easy access to any Portacath or PICC line.   We strive to give you quality time with your provider. You may need to reschedule your appointment if you arrive late (15 or more minutes).  Arriving late affects you and other patients whose appointments are after yours.  Also, if you miss three or more appointments without notifying the office, you may be dismissed from the clinic at the providers discretion.      For prescription refill requests, have your pharmacy contact our office and allow 72 hours for refills to be completed.    Today you received the following chemotherapy and/or immunotherapy agents Keytruda, Gemzar and Carboplatin      To help prevent nausea and vomiting after your treatment, we encourage you to take your nausea medication as directed.  BELOW ARE SYMPTOMS THAT SHOULD BE REPORTED IMMEDIATELY: *FEVER GREATER THAN 100.4 F (38 C) OR HIGHER *CHILLS OR SWEATING *NAUSEA AND VOMITING THAT IS NOT CONTROLLED WITH YOUR NAUSEA MEDICATION *UNUSUAL SHORTNESS OF BREATH *UNUSUAL BRUISING OR BLEEDING *URINARY PROBLEMS (pain or burning when urinating, or frequent urination) *BOWEL PROBLEMS (unusual diarrhea, constipation, pain near the anus) TENDERNESS IN MOUTH AND THROAT WITH OR WITHOUT PRESENCE OF ULCERS (sore throat, sores in mouth, or a toothache) UNUSUAL RASH, SWELLING OR PAIN  UNUSUAL VAGINAL DISCHARGE OR ITCHING   Items with * indicate a potential emergency and should be followed up as soon as possible or go to the Emergency Department if any problems should occur.  Please show the CHEMOTHERAPY ALERT CARD or IMMUNOTHERAPY ALERT  CARD at check-in to the Emergency Department and triage nurse. Should you have questions after your visit or need to cancel or reschedule your appointment, please contact [facility]  [phone] and follow the prompts.  Office hours are 8:00 a.m. to 4:30 p.m. Monday - Friday. Please note that voicemails left after 4:00 p.m. may not be returned until the following business day.  We are closed weekends and major holidays. You have access to a nurse at all times for urgent questions. Please call the main number to the clinic [phone] and follow the prompts.  For any non-urgent questions, you may also contact your provider using MyChart. We now offer e-Visits for anyone 64 and older to request care online for non-urgent symptoms. For details visit [email or web address].   Also download the MyChart app! Go to the app store, search "MyChart", open the app, select [facility], and log in with your MyChart username and password.  Due to Covid, a mask is required upon entering the hospital/clinic. If you do not have a mask, one will be given to you upon arrival. For doctor visits, patients may have 1 support person aged 40 or older with them. For treatment visits, patients cannot have anyone with them due to current Covid guidelines and our immunocompromised population.

## 2021-12-21 NOTE — Progress Notes (Signed)
Reviewed pt labs with Dr. Marin Olp and pt ok to treat with ANC 0.8 Per Dr. Marin Olp gemzar dose has been reduced.   Reviewed neutropenic precautions with patient including fever guidelines, good hand washing and avoiding crowds. Pt verbalized understanding and had no further questions.

## 2021-12-21 NOTE — Progress Notes (Signed)
Hematology and Oncology Follow Up Visit  MIMA CRANMORE [id number] Apr 02, 1966 56 y.o. 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 is back for follow-up.  She tolerated her first cycle of chemotherapy quite well.  She really had no problems with mouth sores.  She had no fever.  She had no diarrhea.  She had really no nausea or vomiting.  She still has some fullness in the left supraclavicular region.  There is been no problems with cough or shortness of breath.  She has had no bleeding.  There is been no rashes.  She has had no headache.  The big news is that she and her fianc will be married on July 7 of this year.  I am so happy for her.  I am sure that she will have a wonderful wedding and honeymoon.  The MS Contin really has helped with her pain.  She is on MS Contin along with Norco which has been doing a great job with a SPECT to her pain.  Overall, I would say performance status is probably ECOG 1.     Medications:  Current Outpatient Medications:    ACCU-CHEK GUIDE test strip, 3 (three) times daily., Disp: , Rfl:    albuterol (VENTOLIN HFA) 108 (90 Base) MCG/ACT inhaler, Inhale 2 puffs into the lungs every 6 (six) hours as needed for wheezing or shortness of breath., Disp: , Rfl:    Continuous Blood Gluc Sensor (FREESTYLE LIBRE 2 SENSOR) MISC, Inject 1 sensor to the skin every 14 days for continuous glucose monitoring., Disp: , Rfl:    fenofibrate (TRICOR) 145 MG tablet, Take 145 mg by mouth daily., Disp: , Rfl:    HYDROcodone-acetaminophen (NORCO/VICODIN) 5-325 MG tablet, Take 1 tablet by mouth every 4 (four) hours as needed for moderate pain., Disp: 90 tablet, Rfl: 0   insulin aspart (NOVOLOG FLEXPEN) 100 UNIT/ML FlexPen, Inject 3 Units into the skin 3 (three) times daily  with meals. (Patient taking differently: Inject 10-16 Units into the skin 3 (three) times daily with meals.), Disp: 15 mL, Rfl: 0   insulin glargine (LANTUS) 100 UNIT/ML injection, Inject 25 Units into the skin at bedtime., Disp: , Rfl:    Insulin Pen Needle (B-D UF III MINI PEN NEEDLES) 31G X 5 MM MISC, Use to administer insulin 4 times daily, Disp: , Rfl:    lidocaine-prilocaine (EMLA) cream, Apply to affected area once, Disp: 30 g, Rfl: 3   losartan (COZAAR) 25 MG tablet, Take 1 tablet (25 mg total) by mouth daily. (Patient taking differently: Take 12.5 mg by mouth daily.), Disp: 30 tablet, Rfl: 0   metFORMIN (GLUCOPHAGE) 1000 MG tablet, Take 1,000 mg by mouth 2 (two) times daily with a meal., Disp: , Rfl:    metoprolol succinate (TOPROL-XL) 25 MG 24 hr tablet, TAKE 1 AND 1/2 TABLETS BY MOUTH EVERY DAY (Patient taking differently: Take 25 mg by mouth daily.), Disp: 45 tablet, Rfl: 10   morphine (MS CONTIN) 15 MG 12 hr tablet, Take 1 tablet (15 mg total) by mouth every 12 (twelve) hours., Disp: 60 tablet, Rfl: 0   nitroGLYCERIN (NITROSTAT) 0.4 MG SL tablet, Place 1 tablet (0.4 mg total) under the tongue every 5 (five) minutes as needed for chest pain. (Patient not taking: Reported on 11/06/2021), Disp: 25 tablet, Rfl: 3  OLANZapine (ZYPREXA) 10 MG tablet, TAKE 1 TABLET BY MOUTH EVERYDAY AT BEDTIME, Disp: 90 tablet, Rfl: 2   omeprazole (PRILOSEC) 20 MG capsule, Take 20 mg by mouth daily., Disp: , Rfl:    ondansetron (ZOFRAN) 8 MG tablet, Take 1 tablet (8 mg total) by mouth 2 (two) times daily as needed for refractory nausea / vomiting. Start on day 3 after chemotherapy., Disp: 30 tablet, Rfl: 1   Potassium 99 MG TABS, Take 99 mg by mouth daily., Disp: , Rfl:    pravastatin (PRAVACHOL) 20 MG tablet, Take 20 mg by mouth daily., Disp: , Rfl:    prochlorperazine (COMPAZINE) 10 MG tablet, Take 1 tablet (10 mg total) by mouth every 6 (six) hours as needed (Nausea or vomiting)., Disp: 30 tablet, Rfl: 1    QUEtiapine (SEROQUEL) 400 MG tablet, Take 800 mg by mouth at bedtime., Disp: , Rfl:   Allergies:  Allergies  Allergen Reactions   Lithium Other (See Comments)    Spinal fluid built up in brain   Trulicity [Dulaglutide] Nausea And Vomiting   Penicillins Other (See Comments)    UNKNOWN CHILDHOOD REACTION    Past Medical History, Surgical history, Social history, and Family History were reviewed and updated.  Review of Systems: Review of Systems  Constitutional: Negative.   HENT:   Positive for lump/mass.   Eyes: Negative.   Respiratory: Negative.    Cardiovascular: Negative.   Gastrointestinal: Negative.   Endocrine: Negative.   Genitourinary: Negative.    Musculoskeletal: Negative.   Skin: Negative.   Neurological:  Positive for numbness.  Hematological: Negative.   Psychiatric/Behavioral: Negative.     Physical Exam:  vitals were not taken for this visit.   Wt Readings from Last 3 Encounters:  11/23/21 192 lb (87.1 kg)  11/06/21 196 lb 1.3 oz (88.9 kg)  10/05/21 196 lb (88.9 kg)    Physical Exam Vitals reviewed.  Constitutional:      Comments: Her breast exam shows right breast with no masses, edema or erythema.  There is no right axillary adenopathy.  Left breast is retracted from radiation and surgery.  There is some hyperpigmentation of the left breast.  She has the lumpectomy site has well-healed.  She has little bit of radiation telangiectasias at the biopsy site.  There is no left axillary adenopathy.  HENT:     Head: Normocephalic and atraumatic.  Eyes:     Pupils: Pupils are equal, round, and reactive to light.  Neck:     Comments: Neck exam shows some fullness in the left neck.  There is a mass that is palpable.  It is not all that mobile.  It probably measures about 4 x 4 cm.  Right neck is unremarkable. Cardiovascular:     Rate and Rhythm: Normal rate and regular rhythm.     Heart sounds: Normal heart sounds.  Pulmonary:     Effort: Pulmonary effort is  normal.     Breath sounds: Normal breath sounds.  Abdominal:     General: Bowel sounds are normal.     Palpations: Abdomen is soft.  Musculoskeletal:        General: No tenderness or deformity. Normal range of motion.     Cervical back: Normal range of motion.  Lymphadenopathy:     Cervical: No cervical adenopathy.  Skin:    General: Skin is warm and dry.     Findings: No erythema or rash.  Neurological:     Mental Status: She is alert and  oriented to person, place, and time.  Psychiatric:        Behavior: Behavior normal.        Thought Content: Thought content normal.        Judgment: Judgment normal.     Lab Results  Component Value Date   WBC 2.6 (L) 12/21/2021   HGB 11.1 (L) 12/21/2021   HCT 32.7 (L) 12/21/2021   MCV 87.0 12/21/2021   PLT 183 12/21/2021     Chemistry      Component Value Date/Time   NA 137 12/07/2021 1056   K 4.0 12/07/2021 1056   CL 106 12/07/2021 1056   CO2 24 12/07/2021 1056   BUN 9 12/07/2021 1056   CREATININE 0.77 12/07/2021 1056      Component Value Date/Time   CALCIUM 8.7 (L) 12/07/2021 1056   ALKPHOS 87 12/07/2021 1056   AST 37 12/07/2021 1056   ALT 68 (H) 12/07/2021 1056   BILITOT 0.3 12/07/2021 1056      Impression and Plan: [name] is a very charming 56 year old white female.  Looks like she has recurrent ductal carcinoma of the left breast.  Looks like the recurrence is a little more extensive than I would have liked.  We will go ahead with her second cycle of chemotherapy.  I will drop the dose of gemcitabine down by 10% given her white cells.  The white cells are little bit on the low side but her monocytes are up.  This indicates that her bone marrow is recovering.  I am just glad that her quality of life is doing well.  I am glad that the pain medicine is working and again she is more functional.  After the third cycle of treatment, then we will repeat her scans and see how everything looks.  We will get her back in 3  more weeks.    Volanda Napoleon, MD 2/27/202310:02 AM

## 2021-12-22 ENCOUNTER — Encounter: Payer: Self-pay | Admitting: Hematology & Oncology

## 2021-12-22 LAB — T4: T4, Total: 4.8 ug/dL (ref 4.5–12.0)

## 2021-12-22 LAB — CANCER ANTIGEN 27.29: CA 27.29: 52.4 U/mL — ABNORMAL HIGH (ref 0.0–38.6)

## 2022-01-04 NOTE — Progress Notes (Signed)
Patient did not receive cycle 2, day 8. ?Will delete per Dr. Antonieta Pert instructions and resume with Cycle 3. ?

## 2022-01-07 ENCOUNTER — Other Ambulatory Visit: Payer: Self-pay | Admitting: Hematology & Oncology

## 2022-01-07 MED ORDER — HYDROCODONE-ACETAMINOPHEN 5-325 MG PO TABS
1.0000 | ORAL_TABLET | ORAL | 0 refills | Status: DC | PRN
  Filled 2022-01-07: qty 90, 15d supply, fill #0

## 2022-01-08 ENCOUNTER — Other Ambulatory Visit (HOSPITAL_BASED_OUTPATIENT_CLINIC_OR_DEPARTMENT_OTHER): Payer: Self-pay

## 2022-01-11 ENCOUNTER — Inpatient Hospital Stay: Payer: Medicaid Other

## 2022-01-11 ENCOUNTER — Other Ambulatory Visit: Payer: Self-pay

## 2022-01-11 ENCOUNTER — Inpatient Hospital Stay (HOSPITAL_BASED_OUTPATIENT_CLINIC_OR_DEPARTMENT_OTHER): Payer: Medicaid Other | Admitting: Hematology & Oncology

## 2022-01-11 ENCOUNTER — Encounter: Payer: Self-pay | Admitting: Hematology & Oncology

## 2022-01-11 ENCOUNTER — Inpatient Hospital Stay: Payer: Medicaid Other | Attending: Hematology & Oncology

## 2022-01-11 VITALS — BP 108/63 | HR 82

## 2022-01-11 VITALS — BP 107/74 | HR 93 | Temp 97.9°F | Resp 17 | Wt 192.0 lb

## 2022-01-11 DIAGNOSIS — C50011 Malignant neoplasm of nipple and areola, right female breast: Secondary | ICD-10-CM

## 2022-01-11 DIAGNOSIS — Z79899 Other long term (current) drug therapy: Secondary | ICD-10-CM | POA: Diagnosis not present

## 2022-01-11 DIAGNOSIS — C50912 Malignant neoplasm of unspecified site of left female breast: Secondary | ICD-10-CM | POA: Insufficient documentation

## 2022-01-11 DIAGNOSIS — Z5111 Encounter for antineoplastic chemotherapy: Secondary | ICD-10-CM | POA: Insufficient documentation

## 2022-01-11 DIAGNOSIS — C7989 Secondary malignant neoplasm of other specified sites: Secondary | ICD-10-CM | POA: Insufficient documentation

## 2022-01-11 DIAGNOSIS — Z5112 Encounter for antineoplastic immunotherapy: Secondary | ICD-10-CM | POA: Diagnosis not present

## 2022-01-11 DIAGNOSIS — M25512 Pain in left shoulder: Secondary | ICD-10-CM

## 2022-01-11 DIAGNOSIS — Z171 Estrogen receptor negative status [ER-]: Secondary | ICD-10-CM | POA: Insufficient documentation

## 2022-01-11 LAB — CBC WITH DIFFERENTIAL (CANCER CENTER ONLY)
Abs Immature Granulocytes: 0.03 10*3/uL (ref 0.00–0.07)
Basophils Absolute: 0 10*3/uL (ref 0.0–0.1)
Basophils Relative: 1 %
Eosinophils Absolute: 0 10*3/uL (ref 0.0–0.5)
Eosinophils Relative: 0 %
HCT: 34.9 % — ABNORMAL LOW (ref 36.0–46.0)
Hemoglobin: 11.6 g/dL — ABNORMAL LOW (ref 12.0–15.0)
Immature Granulocytes: 1 %
Lymphocytes Relative: 38 %
Lymphs Abs: 1.6 10*3/uL (ref 0.7–4.0)
MCH: 30.6 pg (ref 26.0–34.0)
MCHC: 33.2 g/dL (ref 30.0–36.0)
MCV: 92.1 fL (ref 80.0–100.0)
Monocytes Absolute: 0.9 10*3/uL (ref 0.1–1.0)
Monocytes Relative: 21 %
Neutro Abs: 1.6 10*3/uL — ABNORMAL LOW (ref 1.7–7.7)
Neutrophils Relative %: 39 %
Platelet Count: 233 10*3/uL (ref 150–400)
RBC: 3.79 MIL/uL — ABNORMAL LOW (ref 3.87–5.11)
RDW: 20.6 % — ABNORMAL HIGH (ref 11.5–15.5)
Smear Review: NORMAL
WBC Count: 4.2 10*3/uL (ref 4.0–10.5)
nRBC: 0 % (ref 0.0–0.2)

## 2022-01-11 LAB — CMP (CANCER CENTER ONLY)
ALT: 15 U/L (ref 0–44)
AST: 15 U/L (ref 15–41)
Albumin: 4 g/dL (ref 3.5–5.0)
Alkaline Phosphatase: 99 U/L (ref 38–126)
Anion gap: 7 (ref 5–15)
BUN: 12 mg/dL (ref 6–20)
CO2: 24 mmol/L (ref 22–32)
Calcium: 9.5 mg/dL (ref 8.9–10.3)
Chloride: 106 mmol/L (ref 98–111)
Creatinine: 0.82 mg/dL (ref 0.44–1.00)
GFR, Estimated: >60 mL/min (ref >60)
Glucose, Bld: 68 mg/dL — ABNORMAL LOW (ref 70–99)
Potassium: 4.3 mmol/L (ref 3.5–5.1)
Sodium: 137 mmol/L (ref 135–145)
Total Bilirubin: 0.4 mg/dL (ref 0.3–1.2)
Total Protein: 6.8 g/dL (ref 6.5–8.1)

## 2022-01-11 LAB — SAVE SMEAR(SSMR), FOR PROVIDER SLIDE REVIEW

## 2022-01-11 LAB — LACTATE DEHYDROGENASE: LDH: 187 U/L (ref 98–192)

## 2022-01-11 LAB — TSH: TSH: 1.417 u[IU]/mL (ref 0.308–3.960)

## 2022-01-11 MED ORDER — SODIUM CHLORIDE 0.9 % IV SOLN: Freq: Once | INTRAVENOUS | Status: DC

## 2022-01-11 MED ORDER — SODIUM CHLORIDE 0.9 % IV SOLN
10.0000 mg | Freq: Once | INTRAVENOUS | Status: AC
  Administered 2022-01-11: 10 mg via INTRAVENOUS
  Filled 2022-01-11: qty 10

## 2022-01-11 MED ORDER — PALONOSETRON HCL INJECTION 0.25 MG/5ML
0.2500 mg | Freq: Once | INTRAVENOUS | Status: AC
  Administered 2022-01-11: 0.25 mg via INTRAVENOUS
  Filled 2022-01-11: qty 5

## 2022-01-11 MED ORDER — HEPARIN SOD (PORK) LOCK FLUSH 100 UNIT/ML IV SOLN
500.0000 [IU] | Freq: Once | INTRAVENOUS | Status: AC | PRN
  Administered 2022-01-11: 500 [IU]

## 2022-01-11 MED ORDER — SODIUM CHLORIDE 0.9 % IV SOLN
900.0000 mg/m2 | Freq: Once | INTRAVENOUS | Status: AC
  Administered 2022-01-11: 1748 mg via INTRAVENOUS
  Filled 2022-01-11: qty 26.3

## 2022-01-11 MED ORDER — SODIUM CHLORIDE 0.9 % IV SOLN
268.2000 mg | Freq: Once | INTRAVENOUS | Status: AC
  Administered 2022-01-11: 270 mg via INTRAVENOUS
  Filled 2022-01-11: qty 27

## 2022-01-11 MED ORDER — SODIUM CHLORIDE 0.9 % IV SOLN
200.0000 mg | Freq: Once | INTRAVENOUS | Status: AC
  Administered 2022-01-11: 200 mg via INTRAVENOUS
  Filled 2022-01-11: qty 8

## 2022-01-11 MED ORDER — SODIUM CHLORIDE 0.9 % IV SOLN: Freq: Once | INTRAVENOUS | Status: AC

## 2022-01-11 MED ORDER — SODIUM CHLORIDE 0.9% FLUSH
10.0000 mL | INTRAVENOUS | Status: DC | PRN
  Administered 2022-01-11: 10 mL

## 2022-01-11 NOTE — Patient Instructions (Signed)

## 2022-01-11 NOTE — Patient Instructions (Signed)
[facility]  Discharge Instructions: ?Thank you for choosing [facility] to provide your oncology and hematology care.  ? ?If you have a lab appointment with the [facility], please go directly to the [facility] and check in at the registration area. ? ?Wear comfortable clothing and clothing appropriate for easy access to any Portacath or PICC line.  ? ?We strive to give you quality time with your provider. You may need to reschedule your appointment if you arrive late (15 or more minutes).  Arriving late affects you and other patients whose appointments are after yours.  Also, if you miss three or more appointments without notifying the office, you may be dismissed from the clinic at the provider?s discretion.    ?  ?For prescription refill requests, have your pharmacy contact our office and allow 72 hours for refills to be completed.   ? ?Today you received the following chemotherapy and/or immunotherapy agents Keytruda, Gemzar and Carboplatin    ?  ?To help prevent nausea and vomiting after your treatment, we encourage you to take your nausea medication as directed. ? ?BELOW ARE SYMPTOMS THAT SHOULD BE REPORTED IMMEDIATELY: ?*FEVER GREATER THAN 100.4 F (38 ?C) OR HIGHER ?*CHILLS OR SWEATING ?*NAUSEA AND VOMITING THAT IS NOT CONTROLLED WITH YOUR NAUSEA MEDICATION ?*UNUSUAL SHORTNESS OF BREATH ?*UNUSUAL BRUISING OR BLEEDING ?*URINARY PROBLEMS (pain or burning when urinating, or frequent urination) ?*BOWEL PROBLEMS (unusual diarrhea, constipation, pain near the anus) ?TENDERNESS IN MOUTH AND THROAT WITH OR WITHOUT PRESENCE OF ULCERS (sore throat, sores in mouth, or a toothache) ?UNUSUAL RASH, SWELLING OR PAIN  ?UNUSUAL VAGINAL DISCHARGE OR ITCHING  ? ?Items with * indicate a potential emergency and should be followed up as soon as possible or go to the Emergency Department if any problems should occur. ? ?Please show the CHEMOTHERAPY ALERT CARD or IMMUNOTHERAPY ALERT  CARD at check-in to the Emergency Department and triage nurse. ?Should you have questions after your visit or need to cancel or reschedule your appointment, please contact [facility]  [phone] and follow the prompts.  Office hours are 8:00 a.m. to 4:30 p.m. Monday - Friday. Please note that voicemails left after 4:00 p.m. may not be returned until the following business day.  We are closed weekends and major holidays. You have access to a nurse at all times for urgent questions. Please call the main number to the clinic [phone] and follow the prompts. ? ?For any non-urgent questions, you may also contact your provider using MyChart. We now offer e-Visits for anyone 35 and older to request care online for non-urgent symptoms. For details visit [email or web address]. ?  ?Also download the MyChart app! Go to the app store, search "MyChart", open the app, select [facility], and log in with your MyChart username and password. ? ?Due to Covid, a mask is required upon entering the hospital/clinic. If you do not have a mask, one will be given to you upon arrival. For doctor visits, patients may have 1 support person aged 54 or older with them. For treatment visits, patients cannot have anyone with them due to current Covid guidelines and our immunocompromised population.  ?

## 2022-01-11 NOTE — Progress Notes (Signed)
?Hematology and Oncology Follow Up Visit ? ?Ariana Herrera ?[id number] ?Mar 04, 1966 56 y.o. ?01/11/2022 ? ? 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 ?Current Therapy:   ?Carbo/Gemzar/Pembrolizumab -- s/p cycle #2 -- start on 11/27/2021 ?    ?Interim History:  Ms. Ariana Herrera is back for follow-up.  Her main complaint has been pain in the left shoulder.  I do not think this is anything related to breast cancer.  It sounds like this might be more rotator cuff issue or may be some impingement. ? ?This is been going on for a couple weeks.  She has little bit of tenderness in the left upper arm.  There is no swelling. ? ?She feels that the adenopathy in the left neck is also improving.  I am happy about this. ? ?She is still smoking.  I think she is smoking quite a bit. ? ?She has had no cough or shortness of breath.  There is been no nausea or vomiting.  She has had no bleeding.  There is no mouth sores.  She has had no change in bowel or bladder habits.  She has had no leg swelling.  Maybe a little bit of swelling about the ankles. ? ?Her last CA 27.29 was 52. ? ?Overall, I would say performance status is probably ECOG 1.   ? ? ?Medications:  ?Current Outpatient Medications:  ?  ACCU-CHEK GUIDE test strip, 3 (three) times daily., Disp: , Rfl:  ?  albuterol (VENTOLIN HFA) 108 (90 Base) MCG/ACT inhaler, Inhale 2 puffs into the lungs every 6 (six) hours as needed for wheezing or shortness of breath., Disp: , Rfl:  ?  Continuous Blood Gluc Sensor (FREESTYLE LIBRE 2 SENSOR) MISC, Inject 1 sensor to the skin every 14 days for continuous glucose monitoring. (Patient not taking: Reported on 12/21/2021), Disp: , Rfl:  ?  fenofibrate (TRICOR) 145 MG tablet, Take 145 mg by mouth daily., Disp: , Rfl:  ?  HYDROcodone-acetaminophen (NORCO/VICODIN) 5-325 MG tablet, Take 1 tablet by mouth every 4 (four) hours as needed for moderate pain., Disp: 90 tablet, Rfl: 0 ?   insulin aspart (NOVOLOG FLEXPEN) 100 UNIT/ML FlexPen, Inject 3 Units into the skin 3 (three) times daily with meals. (Patient taking differently: Inject 10-16 Units into the skin 3 (three) times daily with meals.), Disp: 15 mL, Rfl: 0 ?  insulin glargine (LANTUS) 100 UNIT/ML injection, Inject 25 Units into the skin at bedtime., Disp: , Rfl:  ?  Insulin Pen Needle (B-D UF III MINI PEN NEEDLES) 31G X 5 MM MISC, Use to administer insulin 4 times daily, Disp: , Rfl:  ?  lidocaine-prilocaine (EMLA) cream, Apply to affected area once, Disp: 30 g, Rfl: 3 ?  losartan (COZAAR) 25 MG tablet, Take 1 tablet (25 mg total) by mouth daily. (Patient taking differently: Take 12.5 mg by mouth daily.), Disp: 30 tablet, Rfl: 0 ?  metFORMIN (GLUCOPHAGE) 1000 MG tablet, Take 1,000 mg by mouth 2 (two) times daily with a meal., Disp: , Rfl:  ?  metoprolol succinate (TOPROL-XL) 25 MG 24 hr tablet, TAKE 1 AND 1/2 TABLETS BY MOUTH EVERY DAY (Patient taking differently: Take 25 mg by mouth daily.), Disp: 45 tablet, Rfl: 10 ?  morphine (MS CONTIN) 15 MG 12 hr tablet, Take 1 tablet (15 mg total) by mouth every 12 (twelve) hours., Disp: 60 tablet, Rfl: 0 ?  nitroGLYCERIN (NITROSTAT) 0.4 MG SL tablet, Place 1 tablet (0.4 mg total) under the tongue  every 5 (five) minutes as needed for chest pain. (Patient not taking: Reported on 11/06/2021), Disp: 25 tablet, Rfl: 3 ?  OLANZapine (ZYPREXA) 10 MG tablet, TAKE 1 TABLET BY MOUTH EVERYDAY AT BEDTIME, Disp: 90 tablet, Rfl: 2 ?  omeprazole (PRILOSEC) 20 MG capsule, Take 20 mg by mouth daily., Disp: , Rfl:  ?  ondansetron (ZOFRAN) 8 MG tablet, Take 1 tablet (8 mg total) by mouth 2 (two) times daily as needed for refractory nausea / vomiting. Start on day 3 after chemotherapy. (Patient not taking: Reported on 12/21/2021), Disp: 30 tablet, Rfl: 1 ?  Potassium 99 MG TABS, Take 99 mg by mouth daily., Disp: , Rfl:  ?  pravastatin (PRAVACHOL) 20 MG tablet, Take 20 mg by mouth daily., Disp: , Rfl:  ?   prochlorperazine (COMPAZINE) 10 MG tablet, Take 1 tablet (10 mg total) by mouth every 6 (six) hours as needed (Nausea or vomiting). (Patient not taking: Reported on 12/21/2021), Disp: 30 tablet, Rfl: 1 ?  QUEtiapine (SEROQUEL) 400 MG tablet, Take 800 mg by mouth at bedtime., Disp: , Rfl:  ?No current facility-administered medications for this visit. ? ?Facility-Administered Medications Ordered in Other Visits:  ?  0.9 %  sodium chloride infusion, , Intravenous, Once, Eleftherios Dudenhoeffer, Rudell Cobb, MD ?  CARBOplatin (PARAPLATIN) 270 mg in sodium chloride 0.9 % 100 mL chemo infusion, 270 mg, Intravenous, Once, Gatsby Chismar, Rudell Cobb, MD ?  gemcitabine (GEMZAR) 1,748 mg in sodium chloride 0.9 % 250 mL chemo infusion, 900 mg/m2 (Treatment Plan Recorded), Intravenous, Once, Latoya Maulding, Rudell Cobb, MD ?  heparin lock flush 100 unit/mL, 500 Units, Intracatheter, Once PRN, Volanda Napoleon, MD ?  sodium chloride flush (NS) 0.9 % injection 10 mL, 10 mL, Intracatheter, PRN, Volanda Napoleon, MD ? ?Allergies:  ?Allergies  ?Allergen Reactions  ? Lithium Other (See Comments)  ?  Spinal fluid built up in brain  ? Trulicity [Dulaglutide] Nausea And Vomiting  ? Penicillins Other (See Comments)  ?  UNKNOWN CHILDHOOD REACTION  ? ? ?Past Medical History, Surgical history, Social history, and Family History were reviewed and updated. ? ?Review of Systems: ?Review of Systems  ?Constitutional: Negative.   ?HENT:   Positive for lump/mass.   ?Eyes: Negative.   ?Respiratory: Negative.    ?Cardiovascular: Negative.   ?Gastrointestinal: Negative.   ?Endocrine: Negative.   ?Genitourinary: Negative.    ?Musculoskeletal: Negative.   ?Skin: Negative.   ?Neurological:  Positive for numbness.  ?Hematological: Negative.   ?Psychiatric/Behavioral: Negative.    ? ?Physical Exam: ? weight is 192 lb (87.1 kg). Her oral temperature is 97.9 ?F (36.6 ?C). Her blood pressure is 107/74 and her pulse is 93. Her respiration is 17 and oxygen saturation is 99%.  ? ?Wt Readings from Last  3 Encounters:  ?01/11/22 192 lb (87.1 kg)  ?12/21/21 189 lb 1.9 oz (85.8 kg)  ?11/23/21 192 lb (87.1 kg)  ? ? ?Physical Exam ?Vitals reviewed.  ?Constitutional:   ?   Comments: Her breast exam shows right breast with no masses, edema or erythema.  There is no right axillary adenopathy.  Left breast is retracted from radiation and surgery.  There is some hyperpigmentation of the left breast.  She has the lumpectomy site has well-healed.  She has little bit of radiation telangiectasias at the biopsy site.  There is no left axillary adenopathy.  ?HENT:  ?   Head: Normocephalic and atraumatic.  ?Eyes:  ?   Pupils: Pupils are equal, round, and reactive to light.  ?Neck:  ?  Comments: Neck exam shows some fullness in the left neck.  There is a mass that is palpable.  It is not all that mobile.  It probably measures about 4 x 4 cm.  Right neck is unremarkable. ?Cardiovascular:  ?   Rate and Rhythm: Normal rate and regular rhythm.  ?   Heart sounds: Normal heart sounds.  ?Pulmonary:  ?   Effort: Pulmonary effort is normal.  ?   Breath sounds: Normal breath sounds.  ?Abdominal:  ?   General: Bowel sounds are normal.  ?   Palpations: Abdomen is soft.  ?Musculoskeletal:     ?   General: No tenderness or deformity. Normal range of motion.  ?   Cervical back: Normal range of motion.  ?Lymphadenopathy:  ?   Cervical: No cervical adenopathy.  ?Skin: ?   General: Skin is warm and dry.  ?   Findings: No erythema or rash.  ?Neurological:  ?   Mental Status: She is alert and oriented to person, place, and time.  ?Psychiatric:     ?   Behavior: Behavior normal.     ?   Thought Content: Thought content normal.     ?   Judgment: Judgment normal.  ? ? ? ?Lab Results  ?Component Value Date  ? WBC 4.2 01/11/2022  ? HGB 11.6 (L) 01/11/2022  ? HCT 34.9 (L) 01/11/2022  ? MCV 92.1 01/11/2022  ? PLT 233 01/11/2022  ? ?  Chemistry   ?   ?Component Value Date/Time  ? NA 137 01/11/2022 0820  ? K 4.3 01/11/2022 0820  ? CL 106 01/11/2022 0820  ? CO2  24 01/11/2022 0820  ? BUN 12 01/11/2022 0820  ? CREATININE 0.82 01/11/2022 0820  ?    ?Component Value Date/Time  ? CALCIUM 9.5 01/11/2022 0820  ? ALKPHOS 99 01/11/2022 0820  ? AST 15 01/11/2022 0820  ? ALT 15 03

## 2022-01-12 ENCOUNTER — Encounter: Payer: Self-pay | Admitting: *Deleted

## 2022-01-12 LAB — CANCER ANTIGEN 27.29: CA 27.29: 69 U/mL — ABNORMAL HIGH (ref 0.0–38.6)

## 2022-01-12 LAB — T4: T4, Total: 4.7 ug/dL (ref 4.5–12.0)

## 2022-01-12 NOTE — Progress Notes (Signed)
Patient had her 3rd cycle of chemo yesterday. She has tolerated treatment well. Dr Marin Olp would like her to have a PET before her next cycle.  ? ?PET scheduled for 01/29/2022. Patient is aware of appointment time, date and location. Reviewed the following with her, as well as sent out a radiology info sheet to her home for education reinforcement: ? ?Arrive at 8:30 ?Water only that morning ?Hold oral diabetic medication ?Hold long acting insulin after midnight ?Hold short acting insulin after 7am ? ?Oncology Nurse Navigator Documentation ? ?Oncology Nurse Navigator Flowsheets 01/12/2022  ?Confirmed Diagnosis Date -  ?Diagnosis Status -  ?Planned Course of Treatment -  ?Phase of Treatment -  ?Chemotherapy Actual Start Date: -  ?Chemotherapy Expected End Date: -  ?Navigator Follow Up Date: 01/29/2022  ?Navigator Follow Up Reason: Scan Review  ?Navigator Location [facility]  ?Navigator Encounter Type Appt/Treatment Plan Review;Telephone  ?Telephone Appt Confirmation/Clarification;Education;Outgoing Call  ?Patient Visit Type MedOnc  ?Treatment Phase Active Tx  ?Barriers/Navigation Needs Coordination of Care;Education  ?Education Other  ?Interventions Coordination of Care;Education;Psycho-Social Support  ?Acuity Level 2-Minimal Needs (1-2 Barriers Identified)  ?Referrals -  ?Coordination of Care Radiology  ?Education Method Verbal;Teach-back;Written  ?Support Groups/Services Friends and Family  ?Time Spent with Patient 30  ?  ?

## 2022-01-13 ENCOUNTER — Ambulatory Visit: Payer: Medicaid Other | Admitting: Internal Medicine

## 2022-01-14 ENCOUNTER — Other Ambulatory Visit: Payer: Self-pay | Admitting: Hematology & Oncology

## 2022-01-14 ENCOUNTER — Other Ambulatory Visit (HOSPITAL_BASED_OUTPATIENT_CLINIC_OR_DEPARTMENT_OTHER): Payer: Self-pay

## 2022-01-14 DIAGNOSIS — C50919 Malignant neoplasm of unspecified site of unspecified female breast: Secondary | ICD-10-CM

## 2022-01-14 MED ORDER — MORPHINE SULFATE ER 15 MG PO TBCR
15.0000 mg | EXTENDED_RELEASE_TABLET | Freq: Two times a day (BID) | ORAL | 0 refills | Status: DC
  Filled 2022-01-14 – 2022-01-18 (×2): qty 60, 30d supply, fill #0

## 2022-01-18 ENCOUNTER — Other Ambulatory Visit: Payer: Self-pay

## 2022-01-18 ENCOUNTER — Inpatient Hospital Stay: Payer: Medicaid Other

## 2022-01-18 ENCOUNTER — Other Ambulatory Visit: Payer: Self-pay | Admitting: Hematology & Oncology

## 2022-01-18 ENCOUNTER — Other Ambulatory Visit (HOSPITAL_BASED_OUTPATIENT_CLINIC_OR_DEPARTMENT_OTHER): Payer: Self-pay

## 2022-01-18 ENCOUNTER — Other Ambulatory Visit: Payer: Self-pay | Admitting: *Deleted

## 2022-01-18 VITALS — HR 92 | Temp 98.9°F

## 2022-01-18 DIAGNOSIS — C50011 Malignant neoplasm of nipple and areola, right female breast: Secondary | ICD-10-CM

## 2022-01-18 DIAGNOSIS — Z5112 Encounter for antineoplastic immunotherapy: Secondary | ICD-10-CM | POA: Diagnosis not present

## 2022-01-18 LAB — CBC WITH DIFFERENTIAL (CANCER CENTER ONLY)
Abs Immature Granulocytes: 0.1 10*3/uL — ABNORMAL HIGH (ref 0.00–0.07)
Basophils Absolute: 0 10*3/uL (ref 0.0–0.1)
Basophils Relative: 0 %
Eosinophils Absolute: 0 10*3/uL (ref 0.0–0.5)
Eosinophils Relative: 0 %
HCT: 32.9 % — ABNORMAL LOW (ref 36.0–46.0)
Hemoglobin: 11.1 g/dL — ABNORMAL LOW (ref 12.0–15.0)
Immature Granulocytes: 3 %
Lymphocytes Relative: 43 %
Lymphs Abs: 1.3 10*3/uL (ref 0.7–4.0)
MCH: 30.5 pg (ref 26.0–34.0)
MCHC: 33.7 g/dL (ref 30.0–36.0)
MCV: 90.4 fL (ref 80.0–100.0)
Monocytes Absolute: 0.2 10*3/uL (ref 0.1–1.0)
Monocytes Relative: 8 %
Neutro Abs: 1.4 10*3/uL — ABNORMAL LOW (ref 1.7–7.7)
Neutrophils Relative %: 46 %
Platelet Count: 121 10*3/uL — ABNORMAL LOW (ref 150–400)
RBC: 3.64 MIL/uL — ABNORMAL LOW (ref 3.87–5.11)
RDW: 18.9 % — ABNORMAL HIGH (ref 11.5–15.5)
Smear Review: NORMAL
WBC Count: 3.1 10*3/uL — ABNORMAL LOW (ref 4.0–10.5)
nRBC: 1 % — ABNORMAL HIGH (ref 0.0–0.2)

## 2022-01-18 LAB — CMP (CANCER CENTER ONLY)
ALT: 56 U/L — ABNORMAL HIGH (ref 0–44)
AST: 36 U/L (ref 15–41)
Albumin: 4.1 g/dL (ref 3.5–5.0)
Alkaline Phosphatase: 76 U/L (ref 38–126)
Anion gap: 8 (ref 5–15)
BUN: 13 mg/dL (ref 6–20)
CO2: 23 mmol/L (ref 22–32)
Calcium: 9.4 mg/dL (ref 8.9–10.3)
Chloride: 102 mmol/L (ref 98–111)
Creatinine: 0.77 mg/dL (ref 0.44–1.00)
GFR, Estimated: >60 mL/min (ref >60)
Glucose, Bld: 248 mg/dL — ABNORMAL HIGH (ref 70–99)
Potassium: 3.9 mmol/L (ref 3.5–5.1)
Sodium: 133 mmol/L — ABNORMAL LOW (ref 135–145)
Total Bilirubin: 0.4 mg/dL (ref 0.3–1.2)
Total Protein: 6.9 g/dL (ref 6.5–8.1)

## 2022-01-18 MED ORDER — HYDROCODONE-ACETAMINOPHEN 5-325 MG PO TABS
1.0000 | ORAL_TABLET | ORAL | 0 refills | Status: DC | PRN
  Filled 2022-01-18 – 2022-01-21 (×2): qty 90, 15d supply, fill #0

## 2022-01-18 MED ORDER — SODIUM CHLORIDE 0.9 % IV SOLN
273.6000 mg | Freq: Once | INTRAVENOUS | Status: AC
  Administered 2022-01-18: 270 mg via INTRAVENOUS
  Filled 2022-01-18: qty 27

## 2022-01-18 MED ORDER — SODIUM CHLORIDE 0.9% FLUSH
10.0000 mL | INTRAVENOUS | Status: DC | PRN
  Administered 2022-01-18: 10 mL

## 2022-01-18 MED ORDER — PALONOSETRON HCL INJECTION 0.25 MG/5ML
0.2500 mg | Freq: Once | INTRAVENOUS | Status: AC
  Administered 2022-01-18: 0.25 mg via INTRAVENOUS
  Filled 2022-01-18: qty 5

## 2022-01-18 MED ORDER — SODIUM CHLORIDE 0.9 % IV SOLN
10.0000 mg | Freq: Once | INTRAVENOUS | Status: AC
  Administered 2022-01-18: 10 mg via INTRAVENOUS
  Filled 2022-01-18: qty 10

## 2022-01-18 MED ORDER — HEPARIN SOD (PORK) LOCK FLUSH 100 UNIT/ML IV SOLN
500.0000 [IU] | Freq: Once | INTRAVENOUS | Status: AC | PRN
  Administered 2022-01-18: 500 [IU]

## 2022-01-18 MED ORDER — SODIUM CHLORIDE 0.9 % IV SOLN: Freq: Once | INTRAVENOUS | Status: AC

## 2022-01-18 MED ORDER — SODIUM CHLORIDE 0.9 % IV SOLN
900.0000 mg/m2 | Freq: Once | INTRAVENOUS | Status: AC
  Administered 2022-01-18: 1748 mg via INTRAVENOUS
  Filled 2022-01-18: qty 26.3

## 2022-01-18 MED ORDER — AZITHROMYCIN 250 MG PO TABS
ORAL_TABLET | ORAL | 0 refills | Status: DC
  Filled 2022-01-18: qty 6, 5d supply, fill #0

## 2022-01-18 NOTE — Progress Notes (Signed)
Ok to treat with ANC 1.4 per Dr Marin Olp.  Patient has bronchitis without a fever.  Zpak will be called in ? ?

## 2022-01-18 NOTE — Patient Instructions (Signed)
[facility]  Discharge Instructions: ?Thank you for choosing [facility] to provide your oncology and hematology care.  ? ?If you have a lab appointment with the [facility], please go directly to the [facility] and check in at the registration area. ? ?Wear comfortable clothing and clothing appropriate for easy access to any Portacath or PICC line.  ? ?We strive to give you quality time with your provider. You may need to reschedule your appointment if you arrive late (15 or more minutes).  Arriving late affects you and other patients whose appointments are after yours.  Also, if you miss three or more appointments without notifying the office, you may be dismissed from the clinic at the provider?s discretion.    ?  ?For prescription refill requests, have your pharmacy contact our office and allow 72 hours for refills to be completed.   ? ?Today you received the following chemotherapy and/or immunotherapy agents Carboplatin, Gemzar    ?  ?To help prevent nausea and vomiting after your treatment, we encourage you to take your nausea medication as directed. ? ?BELOW ARE SYMPTOMS THAT SHOULD BE REPORTED IMMEDIATELY: ?*FEVER GREATER THAN 100.4 F (38 ?C) OR HIGHER ?*CHILLS OR SWEATING ?*NAUSEA AND VOMITING THAT IS NOT CONTROLLED WITH YOUR NAUSEA MEDICATION ?*UNUSUAL SHORTNESS OF BREATH ?*UNUSUAL BRUISING OR BLEEDING ?*URINARY PROBLEMS (pain or burning when urinating, or frequent urination) ?*BOWEL PROBLEMS (unusual diarrhea, constipation, pain near the anus) ?TENDERNESS IN MOUTH AND THROAT WITH OR WITHOUT PRESENCE OF ULCERS (sore throat, sores in mouth, or a toothache) ?UNUSUAL RASH, SWELLING OR PAIN  ?UNUSUAL VAGINAL DISCHARGE OR ITCHING  ? ?Items with * indicate a potential emergency and should be followed up as soon as possible or go to the Emergency Department if any problems should occur. ? ?Please show the CHEMOTHERAPY ALERT CARD or IMMUNOTHERAPY ALERT CARD at  check-in to the Emergency Department and triage nurse. ?Should you have questions after your visit or need to cancel or reschedule your appointment, please contact [facility]  [phone] and follow the prompts.  Office hours are 8:00 a.m. to 4:30 p.m. Monday - Friday. Please note that voicemails left after 4:00 p.m. may not be returned until the following business day.  We are closed weekends and major holidays. You have access to a nurse at all times for urgent questions. Please call the main number to the clinic [phone] and follow the prompts. ? ?For any non-urgent questions, you may also contact your provider using MyChart. We now offer e-Visits for anyone 72 and older to request care online for non-urgent symptoms. For details visit [email or web address]. ?  ?Also download the MyChart app! Go to the app store, search "MyChart", open the app, select [facility], and log in with your MyChart username and password. ? ?Due to Covid, a mask is required upon entering the hospital/clinic. If you do not have a mask, one will be given to you upon arrival. For doctor visits, patients may have 1 support person aged 40 or older with them. For treatment visits, patients cannot have anyone with them due to current Covid guidelines and our immunocompromised population.  ?

## 2022-01-19 LAB — TSH: TSH: 1.263 u[IU]/mL (ref 0.308–3.960)

## 2022-01-19 LAB — T4: T4, Total: 5.4 ug/dL (ref 4.5–12.0)

## 2022-01-21 ENCOUNTER — Other Ambulatory Visit (HOSPITAL_BASED_OUTPATIENT_CLINIC_OR_DEPARTMENT_OTHER): Payer: Self-pay

## 2022-01-27 ENCOUNTER — Ambulatory Visit: Payer: Medicaid Other | Admitting: Family

## 2022-01-27 ENCOUNTER — Other Ambulatory Visit: Payer: Medicaid Other

## 2022-01-29 ENCOUNTER — Encounter: Payer: Self-pay | Admitting: *Deleted

## 2022-01-29 ENCOUNTER — Telehealth: Payer: Self-pay

## 2022-01-29 ENCOUNTER — Ambulatory Visit (HOSPITAL_COMMUNITY)
Admission: RE | Admit: 2022-01-29 | Discharge: 2022-01-29 | Disposition: A | Discharge status: Home / Self Care | Payer: Medicaid Other | Source: Ambulatory Visit | Attending: Hematology & Oncology | Admitting: Hematology & Oncology

## 2022-01-29 DIAGNOSIS — C50011 Malignant neoplasm of nipple and areola, right female breast: Secondary | ICD-10-CM | POA: Insufficient documentation

## 2022-01-29 LAB — GLUCOSE, CAPILLARY: Glucose-Capillary: 114 mg/dL — ABNORMAL HIGH (ref 70–99)

## 2022-01-29 MED ORDER — FLUDEOXYGLUCOSE F - 18 (FDG) INJECTION
9.7700 | Freq: Once | INTRAVENOUS | Status: AC
  Administered 2022-01-29: 9.77 via INTRAVENOUS

## 2022-01-29 NOTE — Telephone Encounter (Signed)
-----   Message from Volanda Napoleon, MD sent at 01/29/2022  1:36 PM EDT ----- ?Call and let her know that the PET scan shows a very nice response.  Most of the lymph nodes in the left neck have resolved. ?

## 2022-01-29 NOTE — Progress Notes (Signed)
Oncology Nurse Navigator Documentation ? ? ?  01/29/2022  ?  1:30 PM  ?Oncology Nurse Navigator Flowsheets  ?Navigator Follow Up Date: 02/02/2022  ?Navigator Follow Up Reason: Follow-up Appointment;Chemotherapy  ?Navigator Location [facility]  ?Navigator Encounter Type Scan Review  ?Patient Visit Type MedOnc  ?Treatment Phase Active Tx  ?Barriers/Navigation Needs Coordination of Care;Education  ?Interventions None Required  ?Acuity Level 2-Minimal Needs (1-2 Barriers Identified)  ?Support Groups/Services Friends and Family  ?Time Spent with Patient 15  ?  ?

## 2022-01-29 NOTE — Telephone Encounter (Signed)
Advised via MyChart.

## 2022-01-30 ENCOUNTER — Ambulatory Visit (HOSPITAL_COMMUNITY): Payer: Medicaid Other

## 2022-02-02 ENCOUNTER — Other Ambulatory Visit: Payer: Self-pay | Admitting: Hematology & Oncology

## 2022-02-02 ENCOUNTER — Inpatient Hospital Stay (HOSPITAL_BASED_OUTPATIENT_CLINIC_OR_DEPARTMENT_OTHER): Payer: Medicaid Other | Admitting: Hematology & Oncology

## 2022-02-02 ENCOUNTER — Other Ambulatory Visit: Payer: Self-pay

## 2022-02-02 ENCOUNTER — Inpatient Hospital Stay: Payer: Medicaid Other

## 2022-02-02 ENCOUNTER — Encounter: Payer: Self-pay | Admitting: Hematology & Oncology

## 2022-02-02 ENCOUNTER — Other Ambulatory Visit (HOSPITAL_BASED_OUTPATIENT_CLINIC_OR_DEPARTMENT_OTHER): Payer: Self-pay

## 2022-02-02 ENCOUNTER — Encounter: Payer: Self-pay | Admitting: *Deleted

## 2022-02-02 ENCOUNTER — Inpatient Hospital Stay: Payer: Medicaid Other | Attending: Hematology & Oncology

## 2022-02-02 VITALS — BP 122/58 | HR 90 | Temp 98.0°F | Resp 18 | Ht 62.0 in | Wt 197.4 lb

## 2022-02-02 DIAGNOSIS — C50912 Malignant neoplasm of unspecified site of left female breast: Secondary | ICD-10-CM | POA: Insufficient documentation

## 2022-02-02 DIAGNOSIS — Z171 Estrogen receptor negative status [ER-]: Secondary | ICD-10-CM | POA: Diagnosis not present

## 2022-02-02 DIAGNOSIS — Z452 Encounter for adjustment and management of vascular access device: Secondary | ICD-10-CM | POA: Insufficient documentation

## 2022-02-02 DIAGNOSIS — C50011 Malignant neoplasm of nipple and areola, right female breast: Secondary | ICD-10-CM | POA: Diagnosis not present

## 2022-02-02 DIAGNOSIS — Z79899 Other long term (current) drug therapy: Secondary | ICD-10-CM | POA: Diagnosis not present

## 2022-02-02 DIAGNOSIS — G620 Drug-induced polyneuropathy: Secondary | ICD-10-CM | POA: Diagnosis not present

## 2022-02-02 DIAGNOSIS — Z5112 Encounter for antineoplastic immunotherapy: Secondary | ICD-10-CM | POA: Insufficient documentation

## 2022-02-02 DIAGNOSIS — Z5111 Encounter for antineoplastic chemotherapy: Secondary | ICD-10-CM | POA: Insufficient documentation

## 2022-02-02 LAB — CBC WITH DIFFERENTIAL (CANCER CENTER ONLY)
Abs Immature Granulocytes: 0.01 10*3/uL (ref 0.00–0.07)
Basophils Absolute: 0 10*3/uL (ref 0.0–0.1)
Basophils Relative: 0 %
Eosinophils Absolute: 0 10*3/uL (ref 0.0–0.5)
Eosinophils Relative: 0 %
HCT: 26.4 % — ABNORMAL LOW (ref 36.0–46.0)
Hemoglobin: 8.9 g/dL — ABNORMAL LOW (ref 12.0–15.0)
Immature Granulocytes: 1 %
Lymphocytes Relative: 44 %
Lymphs Abs: 0.8 10*3/uL (ref 0.7–4.0)
MCH: 31.4 pg (ref 26.0–34.0)
MCHC: 33.7 g/dL (ref 30.0–36.0)
MCV: 93.3 fL (ref 80.0–100.0)
Monocytes Absolute: 0.3 10*3/uL (ref 0.1–1.0)
Monocytes Relative: 17 %
Neutro Abs: 0.7 10*3/uL — ABNORMAL LOW (ref 1.7–7.7)
Neutrophils Relative %: 38 %
Platelet Count: 59 10*3/uL — ABNORMAL LOW (ref 150–400)
RBC: 2.83 MIL/uL — ABNORMAL LOW (ref 3.87–5.11)
RDW: 21 % — ABNORMAL HIGH (ref 11.5–15.5)
WBC Count: 1.8 10*3/uL — ABNORMAL LOW (ref 4.0–10.5)
nRBC: 0 % (ref 0.0–0.2)

## 2022-02-02 LAB — CMP (CANCER CENTER ONLY)
ALT: 16 U/L (ref 0–44)
AST: 17 U/L (ref 15–41)
Albumin: 3.9 g/dL (ref 3.5–5.0)
Alkaline Phosphatase: 64 U/L (ref 38–126)
Anion gap: 6 (ref 5–15)
BUN: 12 mg/dL (ref 6–20)
CO2: 24 mmol/L (ref 22–32)
Calcium: 9.2 mg/dL (ref 8.9–10.3)
Chloride: 107 mmol/L (ref 98–111)
Creatinine: 0.74 mg/dL (ref 0.44–1.00)
GFR, Estimated: >60 mL/min (ref >60)
Glucose, Bld: 91 mg/dL (ref 70–99)
Potassium: 4.1 mmol/L (ref 3.5–5.1)
Sodium: 137 mmol/L (ref 135–145)
Total Bilirubin: 0.3 mg/dL (ref 0.3–1.2)
Total Protein: 6.2 g/dL — ABNORMAL LOW (ref 6.5–8.1)

## 2022-02-02 LAB — TSH: TSH: 2.051 u[IU]/mL (ref 0.308–3.960)

## 2022-02-02 MED ORDER — SODIUM CHLORIDE 0.9% FLUSH
10.0000 mL | INTRAVENOUS | Status: DC | PRN
  Administered 2022-02-02: 10 mL via INTRAVENOUS

## 2022-02-02 MED ORDER — HEPARIN SOD (PORK) LOCK FLUSH 100 UNIT/ML IV SOLN
500.0000 [IU] | Freq: Once | INTRAVENOUS | Status: AC
  Administered 2022-02-02: 500 [IU] via INTRAVENOUS

## 2022-02-02 MED ORDER — HYDROCODONE-ACETAMINOPHEN 5-325 MG PO TABS
1.0000 | ORAL_TABLET | ORAL | 0 refills | Status: DC | PRN
  Filled 2022-02-02 – 2022-02-03 (×2): qty 90, 15d supply, fill #0

## 2022-02-02 NOTE — Addendum Note (Signed)
Addended by: Rico Ala on: 02/02/2022 08:53 AM ? ? Modules accepted: Orders ? ?

## 2022-02-02 NOTE — Patient Instructions (Signed)

## 2022-02-02 NOTE — Progress Notes (Signed)
Patient's counts are too low for treatment. Will return next week. ? ?Oncology Nurse Navigator Documentation ? ? ?  02/02/2022  ?  8:45 AM  ?Oncology Nurse Navigator Flowsheets  ?Navigator Follow Up Date: 02/08/2022  ?Navigator Follow Up Reason: Chemotherapy  ?Navigator Location [facility]  ?Navigator Encounter Type Treatment;Appt/Treatment Plan Review  ?Patient Visit Type MedOnc  ?Treatment Phase Active Tx  ?Barriers/Navigation Needs Coordination of Care;Education  ?Interventions Psycho-Social Support  ?Acuity Level 2-Minimal Needs (1-2 Barriers Identified)  ?Support Groups/Services Friends and Family  ?Time Spent with Patient 15  ?  ?

## 2022-02-02 NOTE — Progress Notes (Signed)
?Hematology and Oncology Follow Up Visit ? ?Ariana Herrera ?[id number] ?26-May-1966 56 y.o. ?02/02/2022 ? ? 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 ?Current Therapy:   ?Carbo/Gemzar/Pembrolizumab -- s/p cycle #3-- start on 11/27/2021 ?    ?Interim History:  Ms. Ariana Herrera is back for follow-up.  Thankfully, she is doing quite nicely.  She is responding very well.  Unfortunately, her blood counts just not that great today.  We will have to give her a week off. ? ?The PET scan done on 01/29/2022.  PET scan showed a very nice response with interval decrease of her left supraclavicular lymph node down to 1.3 x 1.2 cm.  Has very little SUV now.  There is no evidence of metastatic disease elsewhere. ? ?She still is having problems with neuropathy.  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  She is on hydrocodone which she says works.  She is not taking any MS Contin. ? ?She says her blood sugars have been doing okay.  We will have to see what they are today.  She has had her last hemoglobin A1c was 6.4. ? ?She has had no problems with bowels or bladder.  She has had no problems with nausea or vomiting.  She has had no rashes.  Is been a little swelling in her lower legs. ? ?Overall, I would say performance status is probably ECOG 1. ? ? ?Medications:  ?Current Outpatient Medications:  ?  ACCU-CHEK GUIDE test strip, 3 (three) times daily., Disp: , Rfl:  ?  albuterol (VENTOLIN HFA) 108 (90 Base) MCG/ACT inhaler, Inhale 2 puffs into the lungs every 6 (six) hours as needed for wheezing or shortness of breath., Disp: , Rfl:  ?  Continuous Blood Gluc Sensor (FREESTYLE LIBRE 2 SENSOR) MISC, , Disp: , Rfl:  ?  fenofibrate (TRICOR) 145 MG tablet, Take 145 mg by mouth daily., Disp: , Rfl:  ?  HYDROcodone-acetaminophen (NORCO/VICODIN) 5-325 MG tablet, Take 1 tablet by mouth every 4 (four) hours as needed for moderate pain., Disp: 90  tablet, Rfl: 0 ?  insulin aspart (NOVOLOG FLEXPEN) 100 UNIT/ML FlexPen, Inject 3 Units into the skin 3 (three) times daily with meals. (Patient taking differently: Inject 10-16 Units into the skin 3 (three) times daily with meals.), Disp: 15 mL, Rfl: 0 ?  insulin glargine (LANTUS) 100 UNIT/ML injection, Inject 25 Units into the skin at bedtime., Disp: , Rfl:  ?  Insulin Pen Needle (B-D UF III MINI PEN NEEDLES) 31G X 5 MM MISC, Use to administer insulin 4 times daily, Disp: , Rfl:  ?  lidocaine-prilocaine (EMLA) cream, Apply to affected area once, Disp: 30 g, Rfl: 3 ?  losartan (COZAAR) 25 MG tablet, Take 1 tablet (25 mg total) by mouth daily. (Patient taking differently: Take 12.5 mg by mouth daily.), Disp: 30 tablet, Rfl: 0 ?  metFORMIN (GLUCOPHAGE) 1000 MG tablet, Take 1,000 mg by mouth 2 (two) times daily with a meal., Disp: , Rfl:  ?  metoprolol succinate (TOPROL-XL) 25 MG 24 hr tablet, TAKE 1 AND 1/2 TABLETS BY MOUTH EVERY DAY (Patient taking differently: Take 25 mg by mouth daily.), Disp: 45 tablet, Rfl: 10 ?  morphine (MS CONTIN) 15 MG 12 hr tablet, Take 1 tablet (15 mg total) by mouth every 12 (twelve) hours., Disp: 60 tablet, Rfl: 0 ?  OLANZapine (ZYPREXA) 10 MG tablet, TAKE 1 TABLET BY MOUTH EVERYDAY AT BEDTIME, Disp: 90 tablet, Rfl: 2 ?  omeprazole (PRILOSEC) 20 MG capsule, Take 20 mg by mouth daily., Disp: , Rfl:  ?  ondansetron (ZOFRAN) 8 MG tablet, Take 1 tablet (8 mg total) by mouth 2 (two) times daily as needed for refractory nausea / vomiting. Start on day 3 after chemotherapy., Disp: 30 tablet, Rfl: 1 ?  Potassium 99 MG TABS, Take 99 mg by mouth daily., Disp: , Rfl:  ?  pravastatin (PRAVACHOL) 20 MG tablet, Take 20 mg by mouth daily., Disp: , Rfl:  ?  prochlorperazine (COMPAZINE) 10 MG tablet, Take 1 tablet (10 mg total) by mouth every 6 (six) hours as needed (Nausea or vomiting)., Disp: 30 tablet, Rfl: 1 ?  QUEtiapine (SEROQUEL) 400 MG tablet, Take 800 mg by mouth at bedtime., Disp: , Rfl:  ?   nitroGLYCERIN (NITROSTAT) 0.4 MG SL tablet, Place 1 tablet (0.4 mg total) under the tongue every 5 (five) minutes as needed for chest pain. (Patient not taking: Reported on 11/06/2021), Disp: 25 tablet, Rfl: 3 ? ?Allergies:  ?Allergies  ?Allergen Reactions  ? Lithium Other (See Comments)  ?  Spinal fluid built up in brain  ? Trulicity [Dulaglutide] Nausea And Vomiting  ? Penicillins Other (See Comments)  ?  UNKNOWN CHILDHOOD REACTION  ? ? ?Past Medical History, Surgical history, Social history, and Family History were reviewed and updated. ? ?Review of Systems: ?Review of Systems  ?Constitutional: Negative.   ?HENT:   Positive for lump/mass.   ?Eyes: Negative.   ?Respiratory: Negative.    ?Cardiovascular: Negative.   ?Gastrointestinal: Negative.   ?Endocrine: Negative.   ?Genitourinary: Negative.    ?Musculoskeletal: Negative.   ?Skin: Negative.   ?Neurological:  Positive for numbness.  ?Hematological: Negative.   ?Psychiatric/Behavioral: Negative.    ? ?Physical Exam: ? height is '5\' 2"'$  (1.575 m) and weight is 197 lb 6.4 oz (89.5 kg). Her oral temperature is 98 ?F (36.7 ?C). Her blood pressure is 122/58 (abnormal) and her pulse is 90. Her respiration is 18 and oxygen saturation is 100%.  ? ?Wt Readings from Last 3 Encounters:  ?02/02/22 197 lb 6.4 oz (89.5 kg)  ?01/11/22 192 lb (87.1 kg)  ?12/21/21 189 lb 1.9 oz (85.8 kg)  ? ? ?Physical Exam ?Vitals reviewed.  ?Constitutional:   ?   Comments: Her breast exam shows right breast with no masses, edema or erythema.  There is no right axillary adenopathy.  Left breast is retracted from radiation and surgery.  There is some hyperpigmentation of the left breast.  She has the lumpectomy site has well-healed.  She has little bit of radiation telangiectasias at the biopsy site.  There is no left axillary adenopathy.  ?HENT:  ?   Head: Normocephalic and atraumatic.  ?Eyes:  ?   Pupils: Pupils are equal, round, and reactive to light.  ?Neck:  ?   Comments: Neck exam shows some  fullness in the left neck.  There is a mass that is palpable.  It is not all that mobile.  It probably measures about 4 x 4 cm.  Right neck is unremarkable. ?Cardiovascular:  ?   Rate and Rhythm: Normal rate and regular rhythm.  ?   Heart sounds: Normal heart sounds.  ?Pulmonary:  ?   Effort: Pulmonary effort is normal.  ?   Breath sounds: Normal breath sounds.  ?Abdominal:  ?   General: Bowel sounds are normal.  ?   Palpations: Abdomen is soft.  ?Musculoskeletal:     ?   General: No tenderness or deformity. Normal  range of motion.  ?   Cervical back: Normal range of motion.  ?Lymphadenopathy:  ?   Cervical: No cervical adenopathy.  ?Skin: ?   General: Skin is warm and dry.  ?   Findings: No erythema or rash.  ?Neurological:  ?   Mental Status: She is alert and oriented to person, place, and time.  ?Psychiatric:     ?   Behavior: Behavior normal.     ?   Thought Content: Thought content normal.     ?   Judgment: Judgment normal.  ? ? ? ?Lab Results  ?Component Value Date  ? WBC 3.1 (L) 01/18/2022  ? HGB 11.1 (L) 01/18/2022  ? HCT 32.9 (L) 01/18/2022  ? MCV 90.4 01/18/2022  ? PLT 121 (L) 01/18/2022  ? ?  Chemistry   ?   ?Component Value Date/Time  ? NA 133 (L) 01/18/2022 1050  ? K 3.9 01/18/2022 1050  ? CL 102 01/18/2022 1050  ? CO2 23 01/18/2022 1050  ? BUN 13 01/18/2022 1050  ? CREATININE 0.77 01/18/2022 1050  ?    ?Component Value Date/Time  ? CALCIUM 9.4 01/18/2022 1050  ? ALKPHOS 76 01/18/2022 1050  ? AST 36 01/18/2022 1050  ? ALT 56 (H) 01/18/2022 1050  ? BILITOT 0.4 01/18/2022 1050  ?  ? ? ?Impression and Plan: ?Ms. Ariana Herrera is a very charming 56 year old white female.  Looks like she has recurrent ductal carcinoma of the left breast.  Looks like the recurrence is a little more extensive than I would have liked. ? ?Again, we will hold on her fourth cycle of chemotherapy.  We will give her a week off.  I think that we may have to move her treatments out to every 28-day cycles.  I will decrease day 8 of gemcitabine  to see if this may help a little bit. ? ?I think we will probably give her a total of 6 cycles of chemotherapy.  We then may want to see if we can do some kind of maintenance protocol if possible. ? ?I am ju

## 2022-02-03 ENCOUNTER — Other Ambulatory Visit (HOSPITAL_BASED_OUTPATIENT_CLINIC_OR_DEPARTMENT_OTHER): Payer: Self-pay

## 2022-02-03 LAB — T4: T4, Total: 4.8 ug/dL (ref 4.5–12.0)

## 2022-02-03 LAB — CANCER ANTIGEN 27.29: CA 27.29: 39.6 U/mL — ABNORMAL HIGH (ref 0.0–38.6)

## 2022-02-08 ENCOUNTER — Inpatient Hospital Stay: Payer: Medicaid Other

## 2022-02-08 ENCOUNTER — Telehealth: Payer: Self-pay | Admitting: *Deleted

## 2022-02-08 VITALS — BP 114/64 | HR 97 | Temp 98.2°F | Resp 18

## 2022-02-08 DIAGNOSIS — C50011 Malignant neoplasm of nipple and areola, right female breast: Secondary | ICD-10-CM

## 2022-02-08 DIAGNOSIS — Z5112 Encounter for antineoplastic immunotherapy: Secondary | ICD-10-CM | POA: Diagnosis not present

## 2022-02-08 LAB — CBC WITH DIFFERENTIAL (CANCER CENTER ONLY)
Abs Immature Granulocytes: 0.01 10*3/uL (ref 0.00–0.07)
Basophils Absolute: 0 10*3/uL (ref 0.0–0.1)
Basophils Relative: 0 %
Eosinophils Absolute: 0 10*3/uL (ref 0.0–0.5)
Eosinophils Relative: 0 %
HCT: 28.1 % — ABNORMAL LOW (ref 36.0–46.0)
Hemoglobin: 9.2 g/dL — ABNORMAL LOW (ref 12.0–15.0)
Immature Granulocytes: 1 %
Lymphocytes Relative: 50 %
Lymphs Abs: 1 10*3/uL (ref 0.7–4.0)
MCH: 31.7 pg (ref 26.0–34.0)
MCHC: 32.7 g/dL (ref 30.0–36.0)
MCV: 96.9 fL (ref 80.0–100.0)
Monocytes Absolute: 0.6 10*3/uL (ref 0.1–1.0)
Monocytes Relative: 29 %
Neutro Abs: 0.4 10*3/uL — CL (ref 1.7–7.7)
Neutrophils Relative %: 20 %
Platelet Count: 179 10*3/uL (ref 150–400)
RBC: 2.9 MIL/uL — ABNORMAL LOW (ref 3.87–5.11)
RDW: 23.4 % — ABNORMAL HIGH (ref 11.5–15.5)
Smear Review: NORMAL
WBC Count: 1.9 10*3/uL — ABNORMAL LOW (ref 4.0–10.5)
nRBC: 1 % — ABNORMAL HIGH (ref 0.0–0.2)

## 2022-02-08 LAB — CMP (CANCER CENTER ONLY)
ALT: 12 U/L (ref 0–44)
AST: 16 U/L (ref 15–41)
Albumin: 4.1 g/dL (ref 3.5–5.0)
Alkaline Phosphatase: 66 U/L (ref 38–126)
Anion gap: 9 (ref 5–15)
BUN: 11 mg/dL (ref 6–20)
CO2: 25 mmol/L (ref 22–32)
Calcium: 9.2 mg/dL (ref 8.9–10.3)
Chloride: 103 mmol/L (ref 98–111)
Creatinine: 0.78 mg/dL (ref 0.44–1.00)
GFR, Estimated: >60 mL/min (ref >60)
Glucose, Bld: 102 mg/dL — ABNORMAL HIGH (ref 70–99)
Potassium: 4 mmol/L (ref 3.5–5.1)
Sodium: 137 mmol/L (ref 135–145)
Total Bilirubin: 0.4 mg/dL (ref 0.3–1.2)
Total Protein: 6.7 g/dL (ref 6.5–8.1)

## 2022-02-08 MED ORDER — SODIUM CHLORIDE 0.9% FLUSH
10.0000 mL | INTRAVENOUS | Status: DC | PRN
  Administered 2022-02-08: 10 mL

## 2022-02-08 MED ORDER — SODIUM CHLORIDE 0.9 % IV SOLN: Freq: Once | INTRAVENOUS | Status: AC

## 2022-02-08 MED ORDER — SODIUM CHLORIDE 0.9 % IV SOLN: Freq: Once | INTRAVENOUS | Status: DC

## 2022-02-08 MED ORDER — SODIUM CHLORIDE 0.9 % IV SOLN
200.0000 mg | Freq: Once | INTRAVENOUS | Status: AC
  Administered 2022-02-08: 200 mg via INTRAVENOUS
  Filled 2022-02-08: qty 8

## 2022-02-08 MED ORDER — SODIUM CHLORIDE 0.9 % IV SOLN
10.0000 mg | Freq: Once | INTRAVENOUS | Status: AC
  Administered 2022-02-08: 10 mg via INTRAVENOUS
  Filled 2022-02-08: qty 10

## 2022-02-08 MED ORDER — SODIUM CHLORIDE 0.9 % IV SOLN
400.0000 mg | Freq: Once | INTRAVENOUS | Status: DC
  Filled 2022-02-08: qty 16

## 2022-02-08 MED ORDER — SODIUM CHLORIDE 0.9 % IV SOLN
273.6000 mg | Freq: Once | INTRAVENOUS | Status: AC
  Administered 2022-02-08: 270 mg via INTRAVENOUS
  Filled 2022-02-08: qty 27

## 2022-02-08 MED ORDER — PALONOSETRON HCL INJECTION 0.25 MG/5ML
0.2500 mg | Freq: Once | INTRAVENOUS | Status: AC
  Administered 2022-02-08: 0.25 mg via INTRAVENOUS

## 2022-02-08 MED ORDER — HEPARIN SOD (PORK) LOCK FLUSH 100 UNIT/ML IV SOLN
500.0000 [IU] | Freq: Once | INTRAVENOUS | Status: AC | PRN
  Administered 2022-02-08: 500 [IU]

## 2022-02-08 MED ORDER — SODIUM CHLORIDE 0.9 % IV SOLN
900.0000 mg/m2 | Freq: Once | INTRAVENOUS | Status: AC
  Administered 2022-02-08: 1748 mg via INTRAVENOUS
  Filled 2022-02-08: qty 26.3

## 2022-02-08 NOTE — Progress Notes (Signed)
Changing Keytruda to 200 mg every 4 weeks per Dr. Antonieta Pert instructions. ?

## 2022-02-08 NOTE — Progress Notes (Signed)
Per dr Marin Olp, okay to treat today, despite labs ?

## 2022-02-08 NOTE — Progress Notes (Signed)
Reviewed with MD "Ok to treat with Recent Vitals  114/44 HR 97" ? ?

## 2022-02-08 NOTE — Patient Instructions (Signed)
[facility]  Discharge Instructions: ?Thank you for choosing [facility] to provide your oncology and hematology care.  ? ?If you have a lab appointment with the [facility], please go directly to the [facility] and check in at the registration area. ? ?Wear comfortable clothing and clothing appropriate for easy access to any Portacath or PICC line.  ? ?We strive to give you quality time with your provider. You may need to reschedule your appointment if you arrive late (15 or more minutes).  Arriving late affects you and other patients whose appointments are after yours.  Also, if you miss three or more appointments without notifying the office, you may be dismissed from the clinic at the provider?s discretion.    ?  ?For prescription refill requests, have your pharmacy contact our office and allow 72 hours for refills to be completed.   ? ?Today you received the following chemotherapy and/or immunotherapy agents keytruda, gemzar, carboplatin   ?  ?To help prevent nausea and vomiting after your treatment, we encourage you to take your nausea medication as directed. ? ?BELOW ARE SYMPTOMS THAT SHOULD BE REPORTED IMMEDIATELY: ?*FEVER GREATER THAN 100.4 F (38 ?C) OR HIGHER ?*CHILLS OR SWEATING ?*NAUSEA AND VOMITING THAT IS NOT CONTROLLED WITH YOUR NAUSEA MEDICATION ?*UNUSUAL SHORTNESS OF BREATH ?*UNUSUAL BRUISING OR BLEEDING ?*URINARY PROBLEMS (pain or burning when urinating, or frequent urination) ?*BOWEL PROBLEMS (unusual diarrhea, constipation, pain near the anus) ?TENDERNESS IN MOUTH AND THROAT WITH OR WITHOUT PRESENCE OF ULCERS (sore throat, sores in mouth, or a toothache) ?UNUSUAL RASH, SWELLING OR PAIN  ?UNUSUAL VAGINAL DISCHARGE OR ITCHING  ? ?Items with * indicate a potential emergency and should be followed up as soon as possible or go to the Emergency Department if any problems should occur. ? ?Please show the CHEMOTHERAPY ALERT CARD or IMMUNOTHERAPY ALERT CARD  at check-in to the Emergency Department and triage nurse. ?Should you have questions after your visit or need to cancel or reschedule your appointment, please contact [facility]  [phone] and follow the prompts.  Office hours are 8:00 a.m. to 4:30 p.m. Monday - Friday. Please note that voicemails left after 4:00 p.m. may not be returned until the following business day.  We are closed weekends and major holidays. You have access to a nurse at all times for urgent questions. Please call the main number to the clinic [phone] and follow the prompts. ? ?For any non-urgent questions, you may also contact your provider using MyChart. We now offer e-Visits for anyone 27 and older to request care online for non-urgent symptoms. For details visit [email or web address] ?  ?Also download the MyChart app! Go to the app store, search "MyChart", open the app, select [facility], and log in with your MyChart username and password. ? ?Due to Covid, a mask is required upon entering the hospital/clinic. If you do not have a mask, one will be given to you upon arrival. For doctor visits, patients may have 1 support person aged 40 or older with them. For treatment visits, patients cannot have anyone with them due to current Covid guidelines and our immunocompromised population.  ?

## 2022-02-08 NOTE — Patient Instructions (Signed)

## 2022-02-08 NOTE — Telephone Encounter (Signed)
Critical ANC reported by [facility] in the lab of .4.  Dr Marin Olp notified.  No orders received ?

## 2022-02-09 ENCOUNTER — Encounter: Payer: Self-pay | Admitting: *Deleted

## 2022-02-09 LAB — T4: T4, Total: 5.8 ug/dL (ref 4.5–12.0)

## 2022-02-09 LAB — TSH: TSH: 1.759 u[IU]/mL (ref 0.350–4.500)

## 2022-02-09 NOTE — Progress Notes (Signed)
Patient treated 02/08/22 after one week delay.  ? ?Oncology Nurse Navigator Documentation ? ? ?  02/09/2022  ?  8:00 AM  ?Oncology Nurse Navigator Flowsheets  ?Navigator Follow Up Date: 03/09/2022  ?Navigator Follow Up Reason: Follow-up Appointment;Chemotherapy  ?Navigator Location [facility]  ?Navigator Encounter Type Appt/Treatment Plan Review  ?Patient Visit Type MedOnc  ?Treatment Phase Active Tx  ?Barriers/Navigation Needs Coordination of Care;Education  ?Interventions None Required  ?Acuity Level 2-Minimal Needs (1-2 Barriers Identified)  ?Support Groups/Services Friends and Family  ?Time Spent with Patient 15  ?  ?

## 2022-02-10 ENCOUNTER — Ambulatory Visit (HOSPITAL_COMMUNITY): Admission: RE | Admit: 2022-02-10 | Payer: Medicaid Other | Source: Ambulatory Visit

## 2022-02-15 ENCOUNTER — Inpatient Hospital Stay: Payer: Medicaid Other

## 2022-02-15 ENCOUNTER — Other Ambulatory Visit: Payer: Self-pay | Admitting: *Deleted

## 2022-02-15 ENCOUNTER — Other Ambulatory Visit: Payer: Self-pay | Admitting: Hematology & Oncology

## 2022-02-15 ENCOUNTER — Other Ambulatory Visit (HOSPITAL_BASED_OUTPATIENT_CLINIC_OR_DEPARTMENT_OTHER): Payer: Self-pay

## 2022-02-15 VITALS — BP 135/59 | HR 107 | Temp 98.9°F | Resp 18

## 2022-02-15 DIAGNOSIS — C50011 Malignant neoplasm of nipple and areola, right female breast: Secondary | ICD-10-CM

## 2022-02-15 DIAGNOSIS — M25512 Pain in left shoulder: Secondary | ICD-10-CM

## 2022-02-15 DIAGNOSIS — N390 Urinary tract infection, site not specified: Secondary | ICD-10-CM

## 2022-02-15 DIAGNOSIS — Z5112 Encounter for antineoplastic immunotherapy: Secondary | ICD-10-CM | POA: Diagnosis not present

## 2022-02-15 LAB — CMP (CANCER CENTER ONLY)
ALT: 33 U/L (ref 0–44)
AST: 46 U/L — ABNORMAL HIGH (ref 15–41)
Albumin: 3.9 g/dL (ref 3.5–5.0)
Alkaline Phosphatase: 57 U/L (ref 38–126)
Anion gap: 8 (ref 5–15)
BUN: 11 mg/dL (ref 6–20)
CO2: 23 mmol/L (ref 22–32)
Calcium: 9.3 mg/dL (ref 8.9–10.3)
Chloride: 105 mmol/L (ref 98–111)
Creatinine: 0.74 mg/dL (ref 0.44–1.00)
GFR, Estimated: >60 mL/min (ref >60)
Glucose, Bld: 143 mg/dL — ABNORMAL HIGH (ref 70–99)
Potassium: 4 mmol/L (ref 3.5–5.1)
Sodium: 136 mmol/L (ref 135–145)
Total Bilirubin: 0.6 mg/dL (ref 0.3–1.2)
Total Protein: 6.8 g/dL (ref 6.5–8.1)

## 2022-02-15 LAB — URINALYSIS, COMPLETE (UACMP) WITH MICROSCOPIC
Bilirubin Urine: NEGATIVE
Glucose, UA: NEGATIVE mg/dL
Hgb urine dipstick: NEGATIVE
Ketones, ur: NEGATIVE mg/dL
Leukocytes,Ua: NEGATIVE
Nitrite: NEGATIVE
Protein, ur: NEGATIVE mg/dL
Specific Gravity, Urine: 1.015 (ref 1.005–1.030)
pH: 7 (ref 5.0–8.0)

## 2022-02-15 LAB — TSH: TSH: 0.331 u[IU]/mL — ABNORMAL LOW (ref 0.350–4.500)

## 2022-02-15 LAB — CBC WITH DIFFERENTIAL (CANCER CENTER ONLY)
Abs Immature Granulocytes: 0.21 10*3/uL — ABNORMAL HIGH (ref 0.00–0.07)
Basophils Absolute: 0 10*3/uL (ref 0.0–0.1)
Basophils Relative: 0 %
Eosinophils Absolute: 0 10*3/uL (ref 0.0–0.5)
Eosinophils Relative: 0 %
HCT: 27.2 % — ABNORMAL LOW (ref 36.0–46.0)
Hemoglobin: 9.2 g/dL — ABNORMAL LOW (ref 12.0–15.0)
Immature Granulocytes: 9 %
Lymphocytes Relative: 32 %
Lymphs Abs: 0.7 10*3/uL (ref 0.7–4.0)
MCH: 32.1 pg (ref 26.0–34.0)
MCHC: 33.8 g/dL (ref 30.0–36.0)
MCV: 94.8 fL (ref 80.0–100.0)
Monocytes Absolute: 0.2 10*3/uL (ref 0.1–1.0)
Monocytes Relative: 9 %
Neutro Abs: 1.1 10*3/uL — ABNORMAL LOW (ref 1.7–7.7)
Neutrophils Relative %: 50 %
Platelet Count: 141 10*3/uL — ABNORMAL LOW (ref 150–400)
RBC: 2.87 MIL/uL — ABNORMAL LOW (ref 3.87–5.11)
RDW: 21.6 % — ABNORMAL HIGH (ref 11.5–15.5)
Smear Review: NORMAL
WBC Count: 2.3 10*3/uL — ABNORMAL LOW (ref 4.0–10.5)
nRBC: 4 % — ABNORMAL HIGH (ref 0.0–0.2)

## 2022-02-15 MED ORDER — HEPARIN SOD (PORK) LOCK FLUSH 100 UNIT/ML IV SOLN
500.0000 [IU] | Freq: Once | INTRAVENOUS | Status: AC
  Administered 2022-02-15: 500 [IU] via INTRAVENOUS

## 2022-02-15 MED ORDER — HYDROCODONE-ACETAMINOPHEN 5-325 MG PO TABS
1.0000 | ORAL_TABLET | ORAL | 0 refills | Status: DC | PRN
  Filled 2022-02-15 – 2022-02-16 (×2): qty 90, 15d supply, fill #0

## 2022-02-15 MED ORDER — SODIUM CHLORIDE 0.9% FLUSH
10.0000 mL | INTRAVENOUS | Status: DC | PRN
  Administered 2022-02-15: 10 mL via INTRAVENOUS

## 2022-02-15 NOTE — Progress Notes (Signed)
Day "8" of treatment plan moved out 1 week per Dr. Antonieta Pert instructions. ?Next dose of Keytruda 400 mg will be 7 weeks after her previous dose. ?

## 2022-02-15 NOTE — Progress Notes (Signed)
No treatment today per order of Dr. Ennever.  

## 2022-02-15 NOTE — Addendum Note (Signed)
Addended by: [place] on: 02/15/2022 12:25 PM ? ? Modules accepted: Orders ? ?

## 2022-02-15 NOTE — Progress Notes (Signed)
Dr. Marin Olp notified of WBC-2.3 and ANC-1.1. Pt complaining of difficulty emptying bladder when urinating.  Dr. Marin Olp notified and order received for U/A and C&S.  No treatment today and move today's treatment out by one week per order of Dr. Marin Olp.  Message sent to scheduling.  Neutropenic precautions reviewed with pt.  ? ?

## 2022-02-16 ENCOUNTER — Other Ambulatory Visit (HOSPITAL_BASED_OUTPATIENT_CLINIC_OR_DEPARTMENT_OTHER): Payer: Self-pay

## 2022-02-16 LAB — URINE CULTURE: Culture: NO GROWTH

## 2022-02-16 LAB — T4: T4, Total: 5.8 ug/dL (ref 4.5–12.0)

## 2022-02-17 ENCOUNTER — Encounter: Payer: Self-pay | Admitting: *Deleted

## 2022-02-22 ENCOUNTER — Inpatient Hospital Stay: Payer: Medicaid Other

## 2022-02-22 ENCOUNTER — Other Ambulatory Visit: Payer: Self-pay | Admitting: *Deleted

## 2022-02-22 ENCOUNTER — Other Ambulatory Visit (HOSPITAL_BASED_OUTPATIENT_CLINIC_OR_DEPARTMENT_OTHER): Payer: Self-pay

## 2022-02-22 ENCOUNTER — Inpatient Hospital Stay: Payer: Medicaid Other | Attending: Hematology & Oncology

## 2022-02-22 VITALS — BP 121/65 | HR 100 | Temp 98.3°F | Resp 18

## 2022-02-22 DIAGNOSIS — Z5112 Encounter for antineoplastic immunotherapy: Secondary | ICD-10-CM | POA: Diagnosis present

## 2022-02-22 DIAGNOSIS — Z5111 Encounter for antineoplastic chemotherapy: Secondary | ICD-10-CM | POA: Diagnosis present

## 2022-02-22 DIAGNOSIS — C50912 Malignant neoplasm of unspecified site of left female breast: Secondary | ICD-10-CM | POA: Insufficient documentation

## 2022-02-22 DIAGNOSIS — F17210 Nicotine dependence, cigarettes, uncomplicated: Secondary | ICD-10-CM | POA: Insufficient documentation

## 2022-02-22 DIAGNOSIS — Z171 Estrogen receptor negative status [ER-]: Secondary | ICD-10-CM | POA: Insufficient documentation

## 2022-02-22 DIAGNOSIS — C50011 Malignant neoplasm of nipple and areola, right female breast: Secondary | ICD-10-CM

## 2022-02-22 LAB — CBC WITH DIFFERENTIAL (CANCER CENTER ONLY)
Abs Immature Granulocytes: 0.02 10*3/uL (ref 0.00–0.07)
Basophils Absolute: 0 10*3/uL (ref 0.0–0.1)
Basophils Relative: 0 %
Eosinophils Absolute: 0 10*3/uL (ref 0.0–0.5)
Eosinophils Relative: 0 %
HCT: 29.7 % — ABNORMAL LOW (ref 36.0–46.0)
Hemoglobin: 10.1 g/dL — ABNORMAL LOW (ref 12.0–15.0)
Immature Granulocytes: 1 %
Lymphocytes Relative: 34 %
Lymphs Abs: 1.2 10*3/uL (ref 0.7–4.0)
MCH: 33.2 pg (ref 26.0–34.0)
MCHC: 34 g/dL (ref 30.0–36.0)
MCV: 97.7 fL (ref 80.0–100.0)
Monocytes Absolute: 0.8 10*3/uL (ref 0.1–1.0)
Monocytes Relative: 21 %
Neutro Abs: 1.6 10*3/uL — ABNORMAL LOW (ref 1.7–7.7)
Neutrophils Relative %: 44 %
Platelet Count: 182 10*3/uL (ref 150–400)
RBC: 3.04 MIL/uL — ABNORMAL LOW (ref 3.87–5.11)
RDW: 23.3 % — ABNORMAL HIGH (ref 11.5–15.5)
Smear Review: NORMAL
WBC Count: 3.6 10*3/uL — ABNORMAL LOW (ref 4.0–10.5)
nRBC: 0 % (ref 0.0–0.2)

## 2022-02-22 LAB — CMP (CANCER CENTER ONLY)
ALT: 12 U/L (ref 0–44)
AST: 13 U/L — ABNORMAL LOW (ref 15–41)
Albumin: 4.1 g/dL (ref 3.5–5.0)
Alkaline Phosphatase: 65 U/L (ref 38–126)
Anion gap: 8 (ref 5–15)
BUN: 15 mg/dL (ref 6–20)
CO2: 23 mmol/L (ref 22–32)
Calcium: 9.3 mg/dL (ref 8.9–10.3)
Chloride: 107 mmol/L (ref 98–111)
Creatinine: 0.72 mg/dL (ref 0.44–1.00)
GFR, Estimated: >60 mL/min (ref >60)
Glucose, Bld: 80 mg/dL (ref 70–99)
Potassium: 3.8 mmol/L (ref 3.5–5.1)
Sodium: 138 mmol/L (ref 135–145)
Total Bilirubin: 0.3 mg/dL (ref 0.3–1.2)
Total Protein: 6.8 g/dL (ref 6.5–8.1)

## 2022-02-22 LAB — TSH: TSH: 2.204 u[IU]/mL (ref 0.350–4.500)

## 2022-02-22 MED ORDER — SODIUM CHLORIDE 0.9% FLUSH
10.0000 mL | INTRAVENOUS | Status: DC | PRN
  Administered 2022-02-22: 10 mL

## 2022-02-22 MED ORDER — SODIUM CHLORIDE 0.9 % IV SOLN
273.6000 mg | Freq: Once | INTRAVENOUS | Status: AC
  Administered 2022-02-22: 270 mg via INTRAVENOUS
  Filled 2022-02-22: qty 27

## 2022-02-22 MED ORDER — HEPARIN SOD (PORK) LOCK FLUSH 100 UNIT/ML IV SOLN
500.0000 [IU] | Freq: Once | INTRAVENOUS | Status: AC | PRN
  Administered 2022-02-22: 500 [IU]

## 2022-02-22 MED ORDER — PALONOSETRON HCL INJECTION 0.25 MG/5ML
0.2500 mg | Freq: Once | INTRAVENOUS | Status: AC
  Administered 2022-02-22: 0.25 mg via INTRAVENOUS
  Filled 2022-02-22: qty 5

## 2022-02-22 MED ORDER — SODIUM CHLORIDE 0.9 % IV SOLN: Freq: Once | INTRAVENOUS | Status: AC

## 2022-02-22 MED ORDER — HYDROCODONE-ACETAMINOPHEN 7.5-325 MG PO TABS
1.0000 | ORAL_TABLET | Freq: Four times a day (QID) | ORAL | 0 refills | Status: DC | PRN
  Filled 2022-02-22: qty 30, 8d supply, fill #0

## 2022-02-22 MED ORDER — SODIUM CHLORIDE 0.9 % IV SOLN
10.0000 mg | Freq: Once | INTRAVENOUS | Status: AC
  Administered 2022-02-22: 10 mg via INTRAVENOUS
  Filled 2022-02-22: qty 10

## 2022-02-22 MED ORDER — SODIUM CHLORIDE 0.9 % IV SOLN
800.0000 mg/m2 | Freq: Once | INTRAVENOUS | Status: AC
  Administered 2022-02-22: 1558 mg via INTRAVENOUS
  Filled 2022-02-22: qty 26.3

## 2022-02-22 NOTE — Patient Instructions (Signed)
[facility] [place]  Discharge Instructions: ?Thank you for choosing [facility] to provide your oncology and hematology care.  ? ?If you have a lab appointment with the [facility], please go directly to the [facility] and check in at the registration area. ? ?Wear comfortable clothing and clothing appropriate for easy access to any Portacath or PICC line.  ? ?We strive to give you quality time with your provider. You may need to reschedule your appointment if you arrive late (15 or more minutes).  Arriving late affects you and other patients whose appointments are after yours.  Also, if you miss three or more appointments without notifying the office, you may be dismissed from the clinic at the provider?s discretion.    ?  ?For prescription refill requests, have your pharmacy contact our office and allow 72 hours for refills to be completed.   ? ?Today you received the following chemotherapy and/or immunotherapy agents gemzar and carboplatin ?    ?  ?To help prevent nausea and vomiting after your treatment, we encourage you to take your nausea medication as directed. ? ?BELOW ARE SYMPTOMS THAT SHOULD BE REPORTED IMMEDIATELY: ?*FEVER GREATER THAN 100.4 F (38 ?C) OR HIGHER ?*CHILLS OR SWEATING ?*NAUSEA AND VOMITING THAT IS NOT CONTROLLED WITH YOUR NAUSEA MEDICATION ?*UNUSUAL SHORTNESS OF BREATH ?*UNUSUAL BRUISING OR BLEEDING ?*URINARY PROBLEMS (pain or burning when urinating, or frequent urination) ?*BOWEL PROBLEMS (unusual diarrhea, constipation, pain near the anus) ?TENDERNESS IN MOUTH AND THROAT WITH OR WITHOUT PRESENCE OF ULCERS (sore throat, sores in mouth, or a toothache) ?UNUSUAL RASH, SWELLING OR PAIN  ?UNUSUAL VAGINAL DISCHARGE OR ITCHING  ? ?Items with * indicate a potential emergency and should be followed up as soon as possible or go to the Emergency Department if any problems should occur. ? ?Please show the CHEMOTHERAPY ALERT CARD or IMMUNOTHERAPY ALERT CARD at  check-in to the Emergency Department and triage nurse. ?Should you have questions after your visit or need to cancel or reschedule your appointment, please contact [facility]  [phone] and follow the prompts.  Office hours are 8:00 a.m. to 4:30 p.m. Monday - Friday. Please note that voicemails left after 4:00 p.m. may not be returned until the following business day.  We are closed weekends and major holidays. You have access to a nurse at all times for urgent questions. Please call the main number to the clinic [phone] and follow the prompts. ? ?For any non-urgent questions, you may also contact your provider using MyChart. We now offer e-Visits for anyone 35 and older to request care online for non-urgent symptoms. For details visit [email or web address] ?  ?Also download the MyChart app! Go to the app store, search "MyChart", open the app, select [facility], and log in with your MyChart username and password. ? ?Due to Covid, a mask is required upon entering the hospital/clinic. If you do not have a mask, one will be given to you upon arrival. For doctor visits, patients may have 1 support person aged 59 or older with them. For treatment visits, patients cannot have anyone with them due to current Covid guidelines and our immunocompromised population.  ?

## 2022-02-22 NOTE — Progress Notes (Signed)
Patient requests Norco be increased to 7.5 mg.  Ok per Dr Marin Olp.  Script sent to Dr Marin Olp for approval ? ?

## 2022-02-22 NOTE — Patient Instructions (Signed)

## 2022-02-23 LAB — T4: T4, Total: 4.5 ug/dL (ref 4.5–12.0)

## 2022-02-28 ENCOUNTER — Other Ambulatory Visit: Payer: Self-pay | Admitting: Hematology & Oncology

## 2022-03-01 ENCOUNTER — Other Ambulatory Visit (HOSPITAL_BASED_OUTPATIENT_CLINIC_OR_DEPARTMENT_OTHER): Payer: Self-pay

## 2022-03-01 ENCOUNTER — Encounter: Payer: Self-pay | Admitting: Hematology & Oncology

## 2022-03-01 MED ORDER — HYDROCODONE-ACETAMINOPHEN 7.5-325 MG PO TABS
1.0000 | ORAL_TABLET | Freq: Four times a day (QID) | ORAL | 0 refills | Status: DC | PRN
  Filled 2022-03-01: qty 30, 8d supply, fill #0

## 2022-03-04 ENCOUNTER — Encounter: Payer: Self-pay | Admitting: *Deleted

## 2022-03-04 ENCOUNTER — Encounter: Payer: Self-pay | Admitting: Hematology & Oncology

## 2022-03-05 ENCOUNTER — Other Ambulatory Visit (HOSPITAL_BASED_OUTPATIENT_CLINIC_OR_DEPARTMENT_OTHER): Payer: Self-pay

## 2022-03-05 ENCOUNTER — Other Ambulatory Visit: Payer: Self-pay | Admitting: Hematology & Oncology

## 2022-03-05 MED ORDER — HYDROCODONE-ACETAMINOPHEN 7.5-325 MG PO TABS
1.0000 | ORAL_TABLET | ORAL | 0 refills | Status: DC | PRN
  Filled 2022-03-05 (×2): qty 30, 5d supply, fill #0

## 2022-03-05 NOTE — Telephone Encounter (Signed)
Medication increase to every 4 hours as of 03/04/22. Pt will run out over the weekend. Rx instructions changed. Please advised for refills, thanks.  ?

## 2022-03-09 ENCOUNTER — Other Ambulatory Visit (HOSPITAL_BASED_OUTPATIENT_CLINIC_OR_DEPARTMENT_OTHER): Payer: Self-pay

## 2022-03-09 ENCOUNTER — Other Ambulatory Visit: Payer: Self-pay | Admitting: Hematology & Oncology

## 2022-03-09 ENCOUNTER — Encounter: Payer: Self-pay | Admitting: *Deleted

## 2022-03-09 ENCOUNTER — Inpatient Hospital Stay: Payer: Medicaid Other

## 2022-03-09 ENCOUNTER — Inpatient Hospital Stay (HOSPITAL_BASED_OUTPATIENT_CLINIC_OR_DEPARTMENT_OTHER): Payer: Medicaid Other | Admitting: Hematology & Oncology

## 2022-03-09 ENCOUNTER — Encounter: Payer: Self-pay | Admitting: Hematology & Oncology

## 2022-03-09 VITALS — BP 123/75 | HR 93 | Temp 97.8°F | Resp 18

## 2022-03-09 VITALS — BP 120/79 | HR 95 | Temp 97.9°F | Resp 19 | Wt 193.0 lb

## 2022-03-09 DIAGNOSIS — C50011 Malignant neoplasm of nipple and areola, right female breast: Secondary | ICD-10-CM

## 2022-03-09 DIAGNOSIS — Z5112 Encounter for antineoplastic immunotherapy: Secondary | ICD-10-CM | POA: Diagnosis not present

## 2022-03-09 LAB — CMP (CANCER CENTER ONLY)
ALT: 11 U/L (ref 0–44)
AST: 17 U/L (ref 15–41)
Albumin: 4 g/dL (ref 3.5–5.0)
Alkaline Phosphatase: 58 U/L (ref 38–126)
Anion gap: 7 (ref 5–15)
BUN: 15 mg/dL (ref 6–20)
CO2: 23 mmol/L (ref 22–32)
Calcium: 9.6 mg/dL (ref 8.9–10.3)
Chloride: 108 mmol/L (ref 98–111)
Creatinine: 0.8 mg/dL (ref 0.44–1.00)
GFR, Estimated: >60 mL/min (ref >60)
Glucose, Bld: 181 mg/dL — ABNORMAL HIGH (ref 70–99)
Potassium: 4.5 mmol/L (ref 3.5–5.1)
Sodium: 138 mmol/L (ref 135–145)
Total Bilirubin: 0.3 mg/dL (ref 0.3–1.2)
Total Protein: 6.8 g/dL (ref 6.5–8.1)

## 2022-03-09 LAB — CBC WITH DIFFERENTIAL (CANCER CENTER ONLY)
Abs Immature Granulocytes: 0.02 10*3/uL (ref 0.00–0.07)
Basophils Absolute: 0 10*3/uL (ref 0.0–0.1)
Basophils Relative: 0 %
Eosinophils Absolute: 0 10*3/uL (ref 0.0–0.5)
Eosinophils Relative: 0 %
HCT: 30.7 % — ABNORMAL LOW (ref 36.0–46.0)
Hemoglobin: 10.1 g/dL — ABNORMAL LOW (ref 12.0–15.0)
Immature Granulocytes: 1 %
Lymphocytes Relative: 22 %
Lymphs Abs: 0.9 10*3/uL (ref 0.7–4.0)
MCH: 33.6 pg (ref 26.0–34.0)
MCHC: 32.9 g/dL (ref 30.0–36.0)
MCV: 102 fL — ABNORMAL HIGH (ref 80.0–100.0)
Monocytes Absolute: 0.6 10*3/uL (ref 0.1–1.0)
Monocytes Relative: 15 %
Neutro Abs: 2.4 10*3/uL (ref 1.7–7.7)
Neutrophils Relative %: 62 %
Platelet Count: 134 10*3/uL — ABNORMAL LOW (ref 150–400)
RBC: 3.01 MIL/uL — ABNORMAL LOW (ref 3.87–5.11)
RDW: 22 % — ABNORMAL HIGH (ref 11.5–15.5)
Smear Review: NORMAL
WBC Count: 3.8 10*3/uL — ABNORMAL LOW (ref 4.0–10.5)
nRBC: 0 % (ref 0.0–0.2)

## 2022-03-09 LAB — LACTATE DEHYDROGENASE: LDH: 214 U/L — ABNORMAL HIGH (ref 98–192)

## 2022-03-09 MED ORDER — SODIUM CHLORIDE 0.9 % IV SOLN: Freq: Once | INTRAVENOUS | Status: AC

## 2022-03-09 MED ORDER — COLD PACK MISC ONCOLOGY: 1.0000 | Freq: Once | Status: DC | PRN

## 2022-03-09 MED ORDER — HEPARIN SOD (PORK) LOCK FLUSH 100 UNIT/ML IV SOLN
500.0000 [IU] | Freq: Once | INTRAVENOUS | Status: AC | PRN
  Administered 2022-03-09: 500 [IU]

## 2022-03-09 MED ORDER — SODIUM CHLORIDE 0.9 % IV SOLN
400.0000 mg | Freq: Once | INTRAVENOUS | Status: AC
  Administered 2022-03-09: 400 mg via INTRAVENOUS
  Filled 2022-03-09: qty 16

## 2022-03-09 MED ORDER — SODIUM CHLORIDE 0.9 % IV SOLN
10.0000 mg | Freq: Once | INTRAVENOUS | Status: AC
  Administered 2022-03-09: 10 mg via INTRAVENOUS
  Filled 2022-03-09: qty 10

## 2022-03-09 MED ORDER — PALONOSETRON HCL INJECTION 0.25 MG/5ML
0.2500 mg | Freq: Once | INTRAVENOUS | Status: AC
  Administered 2022-03-09: 0.25 mg via INTRAVENOUS
  Filled 2022-03-09: qty 5

## 2022-03-09 MED ORDER — SODIUM CHLORIDE 0.9 % IV SOLN: Freq: Once | INTRAVENOUS | Status: DC

## 2022-03-09 MED ORDER — HYDROCODONE-ACETAMINOPHEN 7.5-325 MG PO TABS
1.0000 | ORAL_TABLET | ORAL | 0 refills | Status: DC | PRN
  Filled 2022-03-09: qty 90, 15d supply, fill #0

## 2022-03-09 MED ORDER — SODIUM CHLORIDE 0.9% FLUSH
10.0000 mL | INTRAVENOUS | Status: DC | PRN
  Administered 2022-03-09: 10 mL

## 2022-03-09 MED ORDER — SODIUM CHLORIDE 0.9 % IV SOLN
800.0000 mg/m2 | Freq: Once | INTRAVENOUS | Status: AC
  Administered 2022-03-09: 1558 mg via INTRAVENOUS
  Filled 2022-03-09: qty 15.78

## 2022-03-09 MED ORDER — SODIUM CHLORIDE 0.9 % IV SOLN
273.6000 mg | Freq: Once | INTRAVENOUS | Status: AC
  Administered 2022-03-09: 270 mg via INTRAVENOUS
  Filled 2022-03-09: qty 27

## 2022-03-09 NOTE — Progress Notes (Signed)
Patient is doing well. She says she has moderate fatigue, but doesn't have any additional side effects.  ? ?Oncology Nurse Navigator Documentation ? ? ?  03/09/2022  ?  2:15 PM  ?Oncology Nurse Navigator Flowsheets  ?Navigator Follow Up Date: 04/06/2022  ?Navigator Follow Up Reason: Follow-up Appointment;Chemotherapy  ?Navigator Location [facility]  ?Navigator Encounter Type Treatment;Appt/Treatment Plan Review  ?Patient Visit Type MedOnc  ?Treatment Phase Active Tx  ?Barriers/Navigation Needs Coordination of Care;Education  ?Interventions Psycho-Social Support  ?Acuity Level 2-Minimal Needs (1-2 Barriers Identified)  ?Support Groups/Services Friends and Family  ?Time Spent with Patient 15  ?  ?

## 2022-03-09 NOTE — Patient Instructions (Signed)
[facility]  Discharge Instructions: ?Thank you for choosing [facility] to provide your oncology and hematology care.  ? ?If you have a lab appointment with the [facility], please go directly to the [facility] and check in at the registration area. ? ?Wear comfortable clothing and clothing appropriate for easy access to any Portacath or PICC line.  ? ?We strive to give you quality time with your provider. You may need to reschedule your appointment if you arrive late (15 or more minutes).  Arriving late affects you and other patients whose appointments are after yours.  Also, if you miss three or more appointments without notifying the office, you may be dismissed from the clinic at the provider?s discretion.    ?  ?For prescription refill requests, have your pharmacy contact our office and allow 72 hours for refills to be completed.   ? ?Today you received the following chemotherapy and/or immunotherapy agents Keytruda, Gemzar and Carboplatin.  ?  ?To help prevent nausea and vomiting after your treatment, we encourage you to take your nausea medication as directed. ? ?BELOW ARE SYMPTOMS THAT SHOULD BE REPORTED IMMEDIATELY: ?*FEVER GREATER THAN 100.4 F (38 ?C) OR HIGHER ?*CHILLS OR SWEATING ?*NAUSEA AND VOMITING THAT IS NOT CONTROLLED WITH YOUR NAUSEA MEDICATION ?*UNUSUAL SHORTNESS OF BREATH ?*UNUSUAL BRUISING OR BLEEDING ?*URINARY PROBLEMS (pain or burning when urinating, or frequent urination) ?*BOWEL PROBLEMS (unusual diarrhea, constipation, pain near the anus) ?TENDERNESS IN MOUTH AND THROAT WITH OR WITHOUT PRESENCE OF ULCERS (sore throat, sores in mouth, or a toothache) ?UNUSUAL RASH, SWELLING OR PAIN  ?UNUSUAL VAGINAL DISCHARGE OR ITCHING  ? ?Items with * indicate a potential emergency and should be followed up as soon as possible or go to the Emergency Department if any problems should occur. ? ?Please show the CHEMOTHERAPY ALERT CARD or IMMUNOTHERAPY ALERT  CARD at check-in to the Emergency Department and triage nurse. ?Should you have questions after your visit or need to cancel or reschedule your appointment, please contact [facility]  [phone] and follow the prompts.  Office hours are 8:00 a.m. to 4:30 p.m. Monday - Friday. Please note that voicemails left after 4:00 p.m. may not be returned until the following business day.  We are closed weekends and major holidays. You have access to a nurse at all times for urgent questions. Please call the main number to the clinic [phone] and follow the prompts. ? ?For any non-urgent questions, you may also contact your provider using MyChart. We now offer e-Visits for anyone 36 and older to request care online for non-urgent symptoms. For details visit [email or web address] ?  ?Also download the MyChart app! Go to the app store, search "MyChart", open the app, select [facility], and log in with your MyChart username and password. ? ?Due to Covid, a mask is required upon entering the hospital/clinic. If you do not have a mask, one will be given to you upon arrival. For doctor visits, patients may have 1 support person aged 2 or older with them. For treatment visits, patients cannot have anyone with them due to current Covid guidelines and our immunocompromised population.  ?

## 2022-03-09 NOTE — Progress Notes (Signed)
?Hematology and Oncology Follow Up Visit ? ?Ariana Herrera ?[id number] ?Nov 19, 1965 56 y.o. ?03/09/2022 ? ? 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 ?Current Therapy:   ?Carbo/Gemzar/Pembrolizumab -- s/p cycle #4-- start on 11/27/2021 ?    ?Interim History:  Ms. Ariana Herrera is back for follow-up.  She seems to be doing pretty well.  Unfortunately, I still think she is smoking again.  I really hate the fact that she is smoking. ? ?She has had no problems with cough or shortness of breath.  She has had no nausea or vomiting.  She has had no change in bowel or bladder habits. ? ?She has had no issues with headache. ? ?She is swallowing okay. ? ?She is having problems with her right shoulder.  I think she has a MRI later on this week.  It sounds like this might be a rotator cuff issue.  I do not think has anything to do with her breast cancer. ? ?She has had no mouth sores. ? ?Her last CA 27.29 was 40. ? ?Her appetite has been pretty decent. ? ?Overall, I would say that her performance status is ECOG 1. ? ? ?Medications:  ?Current Outpatient Medications:  ?  ACCU-CHEK GUIDE test strip, 3 (three) times daily., Disp: , Rfl:  ?  Continuous Blood Gluc Sensor (FREESTYLE LIBRE 2 SENSOR) MISC, , Disp: , Rfl:  ?  fenofibrate (TRICOR) 145 MG tablet, Take 145 mg by mouth daily., Disp: , Rfl:  ?  HYDROcodone-acetaminophen (NORCO) 7.5-325 MG tablet, Take 1 tablet by mouth every 4 (four) hours as needed for moderate pain., Disp: 90 tablet, Rfl: 0 ?  insulin aspart (NOVOLOG FLEXPEN) 100 UNIT/ML FlexPen, Inject 3 Units into the skin 3 (three) times daily with meals. (Patient taking differently: Inject 10-16 Units into the skin 3 (three) times daily with meals.), Disp: 15 mL, Rfl: 0 ?  insulin glargine (LANTUS) 100 UNIT/ML injection, Inject 25 Units into the skin at bedtime., Disp: , Rfl:  ?  Insulin Pen Needle (B-D UF III MINI PEN NEEDLES) 31G X 5 MM MISC, Use to  administer insulin 4 times daily, Disp: , Rfl:  ?  lidocaine-prilocaine (EMLA) cream, Apply to affected area once, Disp: 30 g, Rfl: 3 ?  losartan (COZAAR) 25 MG tablet, Take 1 tablet (25 mg total) by mouth daily. (Patient taking differently: Take 12.5 mg by mouth daily.), Disp: 30 tablet, Rfl: 0 ?  metFORMIN (GLUCOPHAGE) 1000 MG tablet, Take 1,000 mg by mouth 2 (two) times daily with a meal., Disp: , Rfl:  ?  metoprolol succinate (TOPROL-XL) 25 MG 24 hr tablet, TAKE 1 AND 1/2 TABLETS BY MOUTH EVERY DAY (Patient taking differently: Take 25 mg by mouth daily.), Disp: 45 tablet, Rfl: 10 ?  OLANZapine (ZYPREXA) 10 MG tablet, TAKE 1 TABLET BY MOUTH EVERYDAY AT BEDTIME, Disp: 90 tablet, Rfl: 2 ?  omeprazole (PRILOSEC) 20 MG capsule, Take 20 mg by mouth daily., Disp: , Rfl:  ?  pravastatin (PRAVACHOL) 20 MG tablet, Take 20 mg by mouth daily. 03/09/2022 Patient states is taking only Pravastatin., Disp: , Rfl:  ?  QUEtiapine (SEROQUEL) 400 MG tablet, Take 800 mg by mouth at bedtime., Disp: , Rfl:  ?  albuterol (VENTOLIN HFA) 108 (90 Base) MCG/ACT inhaler, Inhale 2 puffs into the lungs every 6 (six) hours as needed for wheezing or shortness of breath. (Patient not taking: Reported on 03/09/2022), Disp: , Rfl:  ?  nitroGLYCERIN (NITROSTAT) 0.4 MG SL  tablet, Place 1 tablet (0.4 mg total) under the tongue every 5 (five) minutes as needed for chest pain. (Patient not taking: Reported on 11/06/2021), Disp: 25 tablet, Rfl: 3 ?  ondansetron (ZOFRAN) 8 MG tablet, Take 1 tablet (8 mg total) by mouth 2 (two) times daily as needed for refractory nausea / vomiting. Start on day 3 after chemotherapy. (Patient not taking: Reported on 03/09/2022), Disp: 30 tablet, Rfl: 1 ?  prochlorperazine (COMPAZINE) 10 MG tablet, Take 1 tablet (10 mg total) by mouth every 6 (six) hours as needed (Nausea or vomiting). (Patient not taking: Reported on 03/09/2022), Disp: 30 tablet, Rfl: 1 ?No current facility-administered medications for this  visit. ? ?Facility-Administered Medications Ordered in Other Visits:  ?  sodium chloride flush (NS) 0.9 % injection 10 mL, 10 mL, Intravenous, PRN, Volanda Napoleon, MD, 10 mL at 02/02/22 0848 ?  sodium chloride flush (NS) 0.9 % injection 10 mL, 10 mL, Intravenous, PRN, Volanda Napoleon, MD, 10 mL at 02/15/22 1219 ? ?Allergies:  ?Allergies  ?Allergen Reactions  ? Lithium Other (See Comments)  ?  Spinal fluid built up in brain  ? Trulicity [Dulaglutide] Nausea And Vomiting  ? Penicillins Other (See Comments)  ?  UNKNOWN CHILDHOOD REACTION  ? ? ?Past Medical History, Surgical history, Social history, and Family History were reviewed and updated. ? ?Review of Systems: ?Review of Systems  ?Constitutional: Negative.   ?HENT:   Positive for lump/mass.   ?Eyes: Negative.   ?Respiratory: Negative.    ?Cardiovascular: Negative.   ?Gastrointestinal: Negative.   ?Endocrine: Negative.   ?Genitourinary: Negative.    ?Musculoskeletal: Negative.   ?Skin: Negative.   ?Neurological:  Positive for numbness.  ?Hematological: Negative.   ?Psychiatric/Behavioral: Negative.    ? ?Physical Exam: ? weight is 193 lb (87.5 kg). Her oral temperature is 97.9 ?F (36.6 ?C). Her blood pressure is 120/79 and her pulse is 95. Her respiration is 19 and oxygen saturation is 100%.  ? ?Wt Readings from Last 3 Encounters:  ?03/09/22 193 lb (87.5 kg)  ?02/02/22 197 lb 6.4 oz (89.5 kg)  ?01/11/22 192 lb (87.1 kg)  ? ? ?Physical Exam ?Vitals reviewed.  ?Constitutional:   ?   Comments: Her breast exam shows right breast with no masses, edema or erythema.  There is no right axillary adenopathy.  Left breast is retracted from radiation and surgery.  There is some hyperpigmentation of the left breast.  She has the lumpectomy site has well-healed.  She has little bit of radiation telangiectasias at the biopsy site.  There is no left axillary adenopathy.  ?HENT:  ?   Head: Normocephalic and atraumatic.  ?Eyes:  ?   Pupils: Pupils are equal, round, and reactive  to light.  ?Neck:  ?   Comments: Neck exam shows some fullness in the left neck.  There is a mass that is palpable.  It is not all that mobile.  It probably measures about 4 x 4 cm.  Right neck is unremarkable. ?Cardiovascular:  ?   Rate and Rhythm: Normal rate and regular rhythm.  ?   Heart sounds: Normal heart sounds.  ?Pulmonary:  ?   Effort: Pulmonary effort is normal.  ?   Breath sounds: Normal breath sounds.  ?Abdominal:  ?   General: Bowel sounds are normal.  ?   Palpations: Abdomen is soft.  ?Musculoskeletal:     ?   General: No tenderness or deformity. Normal range of motion.  ?   Cervical back: Normal  range of motion.  ?Lymphadenopathy:  ?   Cervical: No cervical adenopathy.  ?Skin: ?   General: Skin is warm and dry.  ?   Findings: No erythema or rash.  ?Neurological:  ?   Mental Status: She is alert and oriented to person, place, and time.  ?Psychiatric:     ?   Behavior: Behavior normal.     ?   Thought Content: Thought content normal.     ?   Judgment: Judgment normal.  ? ? ? ?Lab Results  ?Component Value Date  ? WBC 3.8 (L) 03/09/2022  ? HGB 10.1 (L) 03/09/2022  ? HCT 30.7 (L) 03/09/2022  ? MCV 102.0 (H) 03/09/2022  ? PLT 134 (L) 03/09/2022  ? ?  Chemistry   ?   ?Component Value Date/Time  ? NA 138 03/09/2022 0920  ? K 4.5 03/09/2022 0920  ? CL 108 03/09/2022 0920  ? CO2 23 03/09/2022 0920  ? BUN 15 03/09/2022 0920  ? CREATININE 0.80 03/09/2022 0920  ?    ?Component Value Date/Time  ? CALCIUM 9.6 03/09/2022 0920  ? ALKPHOS 58 03/09/2022 0920  ? AST 17 03/09/2022 0920  ? ALT 11 03/09/2022 0920  ? BILITOT 0.3 03/09/2022 0920  ?  ? ? ?Impression and Plan: ?Ms. Eimers is a very charming 56 year old white female.  Looks like she has recurrent ductal carcinoma of the left breast.  Looks like the recurrence is a little more extensive than I would have liked. ? ?We will go ahead with her fifth cycle of treatment.  After 6 cycles, we will then we scan her and then see how everything looks and see how we can  try to do some kind of maintenance protocol. ? ?I am just happy that she seems to be doing pretty well.  I do hate the fact that she is smoking.  I told her that smoking and blood sugars will certainly be a problem and co

## 2022-03-10 ENCOUNTER — Encounter: Payer: Self-pay | Admitting: Hematology & Oncology

## 2022-03-10 LAB — CANCER ANTIGEN 27.29: CA 27.29: 55.7 U/mL — ABNORMAL HIGH (ref 0.0–38.6)

## 2022-03-11 ENCOUNTER — Ambulatory Visit (HOSPITAL_COMMUNITY)
Admission: RE | Admit: 2022-03-11 | Discharge: 2022-03-11 | Disposition: A | Discharge status: Home / Self Care | Payer: Medicaid Other | Source: Ambulatory Visit | Attending: Hematology & Oncology | Admitting: Hematology & Oncology

## 2022-03-11 DIAGNOSIS — M25512 Pain in left shoulder: Secondary | ICD-10-CM | POA: Insufficient documentation

## 2022-03-11 MED ORDER — GADOBUTROL 1 MMOL/ML IV SOLN
8.0000 mL | Freq: Once | INTRAVENOUS | Status: AC | PRN
  Administered 2022-03-11: 8 mL via INTRAVENOUS

## 2022-03-15 ENCOUNTER — Telehealth: Payer: Self-pay | Admitting: *Deleted

## 2022-03-15 NOTE — Telephone Encounter (Signed)
Notified Ariana Herrera of MRI results. Gave Ariana Herrera Dr. Latanya Maudlin office # and address. Ariana Herrera verbalized understanding and will call office as instrcuted.

## 2022-03-18 ENCOUNTER — Other Ambulatory Visit: Payer: Self-pay | Admitting: Hematology & Oncology

## 2022-03-19 ENCOUNTER — Encounter: Payer: Self-pay | Admitting: Hematology & Oncology

## 2022-03-19 ENCOUNTER — Other Ambulatory Visit (HOSPITAL_BASED_OUTPATIENT_CLINIC_OR_DEPARTMENT_OTHER): Payer: Self-pay

## 2022-03-19 MED ORDER — HYDROCODONE-ACETAMINOPHEN 7.5-325 MG PO TABS
1.0000 | ORAL_TABLET | ORAL | 0 refills | Status: DC | PRN
Start: 2022-03-19 — End: 2022-04-02
  Filled 2022-03-19 (×2): qty 90, 15d supply, fill #0

## 2022-04-02 ENCOUNTER — Other Ambulatory Visit: Payer: Self-pay | Admitting: Hematology & Oncology

## 2022-04-02 ENCOUNTER — Other Ambulatory Visit (HOSPITAL_BASED_OUTPATIENT_CLINIC_OR_DEPARTMENT_OTHER): Payer: Self-pay

## 2022-04-02 DIAGNOSIS — M25512 Pain in left shoulder: Secondary | ICD-10-CM

## 2022-04-02 DIAGNOSIS — C50011 Malignant neoplasm of nipple and areola, right female breast: Secondary | ICD-10-CM

## 2022-04-02 MED ORDER — HYDROCODONE-ACETAMINOPHEN 7.5-325 MG PO TABS
1.0000 | ORAL_TABLET | ORAL | 0 refills | Status: DC | PRN
  Filled 2022-04-02: qty 90, 15d supply, fill #0

## 2022-04-06 ENCOUNTER — Other Ambulatory Visit: Payer: Self-pay | Admitting: Oncology

## 2022-04-06 ENCOUNTER — Inpatient Hospital Stay (HOSPITAL_BASED_OUTPATIENT_CLINIC_OR_DEPARTMENT_OTHER): Payer: Medicaid Other | Admitting: Hematology & Oncology

## 2022-04-06 ENCOUNTER — Encounter: Payer: Self-pay | Admitting: *Deleted

## 2022-04-06 ENCOUNTER — Inpatient Hospital Stay: Payer: Medicaid Other

## 2022-04-06 ENCOUNTER — Encounter: Payer: Self-pay | Admitting: Hematology & Oncology

## 2022-04-06 ENCOUNTER — Inpatient Hospital Stay: Payer: Medicaid Other | Attending: Hematology & Oncology

## 2022-04-06 VITALS — BP 117/64 | HR 98 | Temp 98.1°F | Resp 18 | Wt 194.0 lb

## 2022-04-06 DIAGNOSIS — Z452 Encounter for adjustment and management of vascular access device: Secondary | ICD-10-CM | POA: Insufficient documentation

## 2022-04-06 DIAGNOSIS — Z5111 Encounter for antineoplastic chemotherapy: Secondary | ICD-10-CM | POA: Insufficient documentation

## 2022-04-06 DIAGNOSIS — C50011 Malignant neoplasm of nipple and areola, right female breast: Secondary | ICD-10-CM | POA: Diagnosis not present

## 2022-04-06 DIAGNOSIS — C50912 Malignant neoplasm of unspecified site of left female breast: Secondary | ICD-10-CM | POA: Diagnosis present

## 2022-04-06 DIAGNOSIS — Z79899 Other long term (current) drug therapy: Secondary | ICD-10-CM | POA: Diagnosis not present

## 2022-04-06 DIAGNOSIS — Z171 Estrogen receptor negative status [ER-]: Secondary | ICD-10-CM | POA: Insufficient documentation

## 2022-04-06 LAB — CBC WITH DIFFERENTIAL (CANCER CENTER ONLY)
Abs Immature Granulocytes: 0.03 10*3/uL (ref 0.00–0.07)
Basophils Absolute: 0 10*3/uL (ref 0.0–0.1)
Basophils Relative: 0 %
Eosinophils Absolute: 0 10*3/uL (ref 0.0–0.5)
Eosinophils Relative: 0 %
HCT: 29.8 % — ABNORMAL LOW (ref 36.0–46.0)
Hemoglobin: 10 g/dL — ABNORMAL LOW (ref 12.0–15.0)
Immature Granulocytes: 1 %
Lymphocytes Relative: 34 %
Lymphs Abs: 1.1 10*3/uL (ref 0.7–4.0)
MCH: 35.1 pg — ABNORMAL HIGH (ref 26.0–34.0)
MCHC: 33.6 g/dL (ref 30.0–36.0)
MCV: 104.6 fL — ABNORMAL HIGH (ref 80.0–100.0)
Monocytes Absolute: 0.6 10*3/uL (ref 0.1–1.0)
Monocytes Relative: 19 %
Neutro Abs: 1.4 10*3/uL — ABNORMAL LOW (ref 1.7–7.7)
Neutrophils Relative %: 46 %
Platelet Count: 161 10*3/uL (ref 150–400)
RBC: 2.85 MIL/uL — ABNORMAL LOW (ref 3.87–5.11)
RDW: 18.1 % — ABNORMAL HIGH (ref 11.5–15.5)
Smear Review: NORMAL
WBC Count: 3.1 10*3/uL — ABNORMAL LOW (ref 4.0–10.5)
nRBC: 0 % (ref 0.0–0.2)

## 2022-04-06 LAB — CMP (CANCER CENTER ONLY)
ALT: 12 U/L (ref 0–44)
AST: 16 U/L (ref 15–41)
Albumin: 4.1 g/dL (ref 3.5–5.0)
Alkaline Phosphatase: 59 U/L (ref 38–126)
Anion gap: 7 (ref 5–15)
BUN: 14 mg/dL (ref 6–20)
CO2: 24 mmol/L (ref 22–32)
Calcium: 9.2 mg/dL (ref 8.9–10.3)
Chloride: 107 mmol/L (ref 98–111)
Creatinine: 0.74 mg/dL (ref 0.44–1.00)
GFR, Estimated: >60 mL/min (ref >60)
Glucose, Bld: 83 mg/dL (ref 70–99)
Potassium: 3.8 mmol/L (ref 3.5–5.1)
Sodium: 138 mmol/L (ref 135–145)
Total Bilirubin: 0.3 mg/dL (ref 0.3–1.2)
Total Protein: 6.5 g/dL (ref 6.5–8.1)

## 2022-04-06 LAB — LACTATE DEHYDROGENASE: LDH: 185 U/L (ref 98–192)

## 2022-04-06 LAB — TSH: TSH: 1.196 u[IU]/mL (ref 0.350–4.500)

## 2022-04-06 MED ORDER — SODIUM CHLORIDE 0.9 % IV SOLN
273.6000 mg | Freq: Once | INTRAVENOUS | Status: AC
  Administered 2022-04-06: 270 mg via INTRAVENOUS
  Filled 2022-04-06: qty 27

## 2022-04-06 MED ORDER — SODIUM CHLORIDE 0.9 % IV SOLN
800.0000 mg/m2 | Freq: Once | INTRAVENOUS | Status: AC
  Administered 2022-04-06: 1558 mg via INTRAVENOUS
  Filled 2022-04-06: qty 26.3

## 2022-04-06 MED ORDER — SODIUM CHLORIDE 0.9 % IV SOLN
10.0000 mg | Freq: Once | INTRAVENOUS | Status: AC
  Administered 2022-04-06: 10 mg via INTRAVENOUS
  Filled 2022-04-06: qty 10

## 2022-04-06 MED ORDER — PALONOSETRON HCL INJECTION 0.25 MG/5ML
0.2500 mg | Freq: Once | INTRAVENOUS | Status: AC
  Administered 2022-04-06: 0.25 mg via INTRAVENOUS
  Filled 2022-04-06: qty 5

## 2022-04-06 MED ORDER — SODIUM CHLORIDE 0.9 % IV SOLN: Freq: Once | INTRAVENOUS | Status: DC

## 2022-04-06 MED ORDER — HEPARIN SOD (PORK) LOCK FLUSH 100 UNIT/ML IV SOLN
500.0000 [IU] | Freq: Once | INTRAVENOUS | Status: AC | PRN
  Administered 2022-04-06: 500 [IU]

## 2022-04-06 MED ORDER — SODIUM CHLORIDE 0.9 % IV SOLN: Freq: Once | INTRAVENOUS | Status: AC

## 2022-04-06 MED ORDER — SODIUM CHLORIDE 0.9% FLUSH
10.0000 mL | INTRAVENOUS | Status: DC | PRN
  Administered 2022-04-06: 10 mL

## 2022-04-06 NOTE — Patient Instructions (Signed)
[facility]  Discharge Instructions: Thank you for choosing [facility] to provide your oncology and hematology care.   If you have a lab appointment with the [facility], please go directly to the [facility] and check in at the registration area.  Wear comfortable clothing and clothing appropriate for easy access to any Portacath or PICC line.   We strive to give you quality time with your provider. You may need to reschedule your appointment if you arrive late (15 or more minutes).  Arriving late affects you and other patients whose appointments are after yours.  Also, if you miss three or more appointments without notifying the office, you may be dismissed from the clinic at the provider's discretion.      For prescription refill requests, have your pharmacy contact our office and allow 72 hours for refills to be completed.    Today you received the following chemotherapy and/or immunotherapy agents carboplatin, gemzar    To help prevent nausea and vomiting after your treatment, we encourage you to take your nausea medication as directed.  BELOW ARE SYMPTOMS THAT SHOULD BE REPORTED IMMEDIATELY: *FEVER GREATER THAN 100.4 F (38 C) OR HIGHER *CHILLS OR SWEATING *NAUSEA AND VOMITING THAT IS NOT CONTROLLED WITH YOUR NAUSEA MEDICATION *UNUSUAL SHORTNESS OF BREATH *UNUSUAL BRUISING OR BLEEDING *URINARY PROBLEMS (pain or burning when urinating, or frequent urination) *BOWEL PROBLEMS (unusual diarrhea, constipation, pain near the anus) TENDERNESS IN MOUTH AND THROAT WITH OR WITHOUT PRESENCE OF ULCERS (sore throat, sores in mouth, or a toothache) UNUSUAL RASH, SWELLING OR PAIN  UNUSUAL VAGINAL DISCHARGE OR ITCHING   Items with * indicate a potential emergency and should be followed up as soon as possible or go to the Emergency Department if any problems should occur.  Please show the CHEMOTHERAPY ALERT CARD or IMMUNOTHERAPY ALERT CARD at check-in  to the Emergency Department and triage nurse. Should you have questions after your visit or need to cancel or reschedule your appointment, please contact [facility]  [phone] and follow the prompts.  Office hours are 8:00 a.m. to 4:30 p.m. Monday - Friday. Please note that voicemails left after 4:00 p.m. may not be returned until the following business day.  We are closed weekends and major holidays. You have access to a nurse at all times for urgent questions. Please call the main number to the clinic [phone] and follow the prompts.  For any non-urgent questions, you may also contact your provider using MyChart. We now offer e-Visits for anyone 33 and older to request care online for non-urgent symptoms. For details visit [email or web address].   Also download the MyChart app! Go to the app store, search "MyChart", open the app, select [facility], and log in with your MyChart username and password.  Masks are optional in the cancer centers. If you would like for your care team to wear a mask while they are taking care of you, please let them know. For doctor visits, patients may have with them one support person who is at least 54 years old. At this time, visitors are not allowed in the infusion area.

## 2022-04-06 NOTE — Progress Notes (Signed)
Patient needs a PET prior to her next appointment. PET scheduled for 05/05/2022.  Patient is aware of PET date, time and location. She is educated to the PET prep. Radiology sheet with same info also given to patient for reinforcement of education. She has all her insulin instructions.   Oncology Nurse Navigator Documentation     04/06/2022    1:15 PM  Oncology Nurse Navigator Flowsheets  Navigator Follow Up Date: 05/05/2022  Navigator Follow Up Reason: Scan Review  Navigator Location [facility]  Navigator Encounter Type Treatment;Appt/Treatment Plan Review  Patient Visit Type MedOnc  Treatment Phase Active Tx  Barriers/Navigation Needs Coordination of Care;Education  Education Other  Interventions Education;Coordination of Care;Psycho-Social Support  Acuity Level 2-Minimal Needs (1-2 Barriers Identified)  Coordination of Care Radiology  Education Method Verbal;Written  Support Groups/Services Friends and Family  Time Spent with Patient 69

## 2022-04-06 NOTE — Progress Notes (Signed)
Hematology and Oncology Follow Up Visit  Ariana Herrera [id number] 1966-09-17 56 y.o. 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  She is doing okay.  Her only complaint has been a little bit of tenderness under the left axilla.  She did have an MRI of the left shoulder.  This was done 03/11/2022.  This showed tendinitis.  There is no cancer in the shoulder itself.  She has some osteoarthritis.  I think she sees a Orthopedist in a week or so.  Her last CA 27.29 was up a little bit.  It was 56.  We will have to watch this.  She is due for another PET scan in about 3 weeks.  Her appetite has been okay.  She is gained weight.  She is still smoking.  Apparently, there is still stress at home.  She has had no obvious change in bowel or bladder habits.  There has been no bleeding.  She has had no rashes.  Is been no leg swelling.  She had a little bit of a cough.  It is nonproductive.  Overall, I would say that her performance status is probably ECOG 1.     Medications:  Current Outpatient Medications:    ACCU-CHEK GUIDE test strip, 3 (three) times daily., Disp: , Rfl:    Continuous Blood Gluc Sensor (FREESTYLE LIBRE 2 SENSOR) MISC, , Disp: , Rfl:    fenofibrate (TRICOR) 145 MG tablet, Take 145 mg by mouth daily., Disp: , Rfl:    HYDROcodone-acetaminophen (NORCO) 7.5-325 MG tablet, Take 1 tablet by mouth every 4 (four) hours as needed for moderate pain., Disp: 90 tablet, Rfl: 0   insulin aspart (NOVOLOG FLEXPEN) 100 UNIT/ML FlexPen, Inject 3 Units into the skin 3 (three) times daily with meals. (Patient taking differently: Inject 10-16 Units into the skin 3 (three) times daily with meals.), Disp: 15 mL, Rfl: 0   insulin glargine (LANTUS) 100 UNIT/ML injection,  Inject 25 Units into the skin at bedtime., Disp: , Rfl:    Insulin Pen Needle (B-D UF III MINI PEN NEEDLES) 31G X 5 MM MISC, Use to administer insulin 4 times daily, Disp: , Rfl:    lidocaine-prilocaine (EMLA) cream, Apply to affected area once, Disp: 30 g, Rfl: 3   losartan (COZAAR) 25 MG tablet, Take 1 tablet (25 mg total) by mouth daily. (Patient taking differently: Take 12.5 mg by mouth daily.), Disp: 30 tablet, Rfl: 0   metFORMIN (GLUCOPHAGE) 1000 MG tablet, Take 1,000 mg by mouth 2 (two) times daily with a meal., Disp: , Rfl:    metoprolol succinate (TOPROL-XL) 25 MG 24 hr tablet, TAKE 1 AND 1/2 TABLETS BY MOUTH EVERY DAY (Patient taking differently: Take 25 mg by mouth daily.), Disp: 45 tablet, Rfl: 10   OLANZapine (ZYPREXA) 10 MG tablet, TAKE 1 TABLET BY MOUTH EVERYDAY AT BEDTIME, Disp: 90 tablet, Rfl: 2   omeprazole (PRILOSEC) 20 MG capsule, Take 20 mg by mouth daily., Disp: , Rfl:    ondansetron (ZOFRAN) 8 MG tablet, Take 1 tablet (8 mg total) by mouth 2 (two) times daily as needed for refractory nausea / vomiting. Start on day 3 after chemotherapy., Disp: 30 tablet, Rfl: 1   pravastatin (PRAVACHOL) 20 MG tablet, Take 20 mg by mouth daily. 03/09/2022 Patient states is taking  only Pravastatin., Disp: , Rfl:    prochlorperazine (COMPAZINE) 10 MG tablet, Take 1 tablet (10 mg total) by mouth every 6 (six) hours as needed (Nausea or vomiting)., Disp: 30 tablet, Rfl: 1   QUEtiapine (SEROQUEL) 400 MG tablet, Take 800 mg by mouth at bedtime., Disp: , Rfl:    albuterol (VENTOLIN HFA) 108 (90 Base) MCG/ACT inhaler, Inhale 2 puffs into the lungs every 6 (six) hours as needed for wheezing or shortness of breath. (Patient not taking: Reported on 03/09/2022), Disp: , Rfl:    nitroGLYCERIN (NITROSTAT) 0.4 MG SL tablet, Place 1 tablet (0.4 mg total) under the tongue every 5 (five) minutes as needed for chest pain. (Patient not taking: Reported on 11/06/2021), Disp: 25 tablet, Rfl: 3 No current  facility-administered medications for this visit.  Facility-Administered Medications Ordered in Other Visits:    sodium chloride flush (NS) 0.9 % injection 10 mL, 10 mL, Intravenous, PRN, Volanda Napoleon, MD, 10 mL at 02/02/22 0848   sodium chloride flush (NS) 0.9 % injection 10 mL, 10 mL, Intravenous, PRN, Volanda Napoleon, MD, 10 mL at 02/15/22 1219  Allergies:  Allergies  Allergen Reactions   Lithium Other (See Comments)    Spinal fluid built up in brain   Trulicity [Dulaglutide] Nausea And Vomiting   Penicillins Other (See Comments)    UNKNOWN CHILDHOOD REACTION    Past Medical History, Surgical history, Social history, and Family History were reviewed and updated.  Review of Systems: Review of Systems  Constitutional: Negative.   HENT:   Positive for lump/mass.   Eyes: Negative.   Respiratory: Negative.    Cardiovascular: Negative.   Gastrointestinal: Negative.   Endocrine: Negative.   Genitourinary: Negative.    Musculoskeletal: Negative.   Skin: Negative.   Neurological:  Positive for numbness.  Hematological: Negative.   Psychiatric/Behavioral: Negative.      Physical Exam:  weight is 194 lb (88 kg). Her oral temperature is 98.1 F (36.7 C). Her blood pressure is 117/64 and her pulse is 98. Her respiration is 18 and oxygen saturation is 99%.   Wt Readings from Last 3 Encounters:  04/06/22 194 lb (88 kg)  03/09/22 193 lb (87.5 kg)  02/02/22 197 lb 6.4 oz (89.5 kg)    Physical Exam Vitals reviewed.  Constitutional:      Comments: Her breast exam shows right breast with no masses, edema or erythema.  There is no right axillary adenopathy.  Left breast is retracted from radiation and surgery.  There is some hyperpigmentation of the left breast.  She has the lumpectomy site has well-healed.  She has little bit of radiation telangiectasias at the biopsy site.  There is no left axillary adenopathy.  HENT:     Head: Normocephalic and atraumatic.  Eyes:     Pupils:  Pupils are equal, round, and reactive to light.  Neck:     Comments: Neck exam shows some fullness in the left neck.  There is a mass that is palpable.  It is not all that mobile.  It probably measures about 4 x 4 cm.  Right neck is unremarkable. Cardiovascular:     Rate and Rhythm: Normal rate and regular rhythm.     Heart sounds: Normal heart sounds.  Pulmonary:     Effort: Pulmonary effort is normal.     Breath sounds: Normal breath sounds.  Abdominal:     General: Bowel sounds are normal.     Palpations: Abdomen is soft.  Musculoskeletal:  General: No tenderness or deformity. Normal range of motion.     Cervical back: Normal range of motion.  Lymphadenopathy:     Cervical: No cervical adenopathy.  Skin:    General: Skin is warm and dry.     Findings: No erythema or rash.  Neurological:     Mental Status: She is alert and oriented to person, place, and time.  Psychiatric:        Behavior: Behavior normal.        Thought Content: Thought content normal.        Judgment: Judgment normal.      Lab Results  Component Value Date   WBC 3.1 (L) 04/06/2022   HGB 10.0 (L) 04/06/2022   HCT 29.8 (L) 04/06/2022   MCV 104.6 (H) 04/06/2022   PLT 161 04/06/2022     Chemistry      Component Value Date/Time   NA 138 04/06/2022 1152   K 3.8 04/06/2022 1152   CL 107 04/06/2022 1152   CO2 24 04/06/2022 1152   BUN 14 04/06/2022 1152   CREATININE 0.74 04/06/2022 1152      Component Value Date/Time   CALCIUM 9.2 04/06/2022 1152   ALKPHOS 59 04/06/2022 1152   AST 16 04/06/2022 1152   ALT 12 04/06/2022 1152   BILITOT 0.3 04/06/2022 1152      Impression and Plan: Ariana Herrera is a very charming 56 year old white female.  Looks like she has recurrent ductal carcinoma of the left breast.  Looks like the recurrence is a little more extensive than I would have liked.  We will go ahead with her sixth cycle of treatment.  After this, we will then plan for a follow-up PET scan on  her.  We will have to see what that looks like.  If we see that she is responding, we will continue with treatment.  If not, we will have to make a change.  Again when I examined her left axilla, there may have been a little bit of tenderness over one of the ribs.  I did not feel any obvious mass or lymph node in the left axilla.  Only give her an extra week off with his next cycle of treatment coming up.  I think it would help her out.   Volanda Napoleon, MD 6/13/20231:35 PM

## 2022-04-06 NOTE — Progress Notes (Signed)
Ok to treat despite counts, per MD

## 2022-04-07 LAB — T4: T4, Total: 5.4 ug/dL (ref 4.5–12.0)

## 2022-04-07 LAB — CANCER ANTIGEN 27.29: CA 27.29: 40.6 U/mL — ABNORMAL HIGH (ref 0.0–38.6)

## 2022-04-13 ENCOUNTER — Inpatient Hospital Stay: Payer: Medicaid Other

## 2022-04-13 ENCOUNTER — Other Ambulatory Visit: Payer: Self-pay | Admitting: Lab

## 2022-04-13 DIAGNOSIS — C50011 Malignant neoplasm of nipple and areola, right female breast: Secondary | ICD-10-CM

## 2022-04-13 DIAGNOSIS — Z5111 Encounter for antineoplastic chemotherapy: Secondary | ICD-10-CM | POA: Diagnosis not present

## 2022-04-13 LAB — CMP (CANCER CENTER ONLY)
ALT: 30 U/L (ref 0–44)
AST: 45 U/L — ABNORMAL HIGH (ref 15–41)
Albumin: 4.2 g/dL (ref 3.5–5.0)
Alkaline Phosphatase: 57 U/L (ref 38–126)
Anion gap: 8 (ref 5–15)
BUN: 18 mg/dL (ref 6–20)
CO2: 23 mmol/L (ref 22–32)
Calcium: 9.6 mg/dL (ref 8.9–10.3)
Chloride: 106 mmol/L (ref 98–111)
Creatinine: 0.98 mg/dL (ref 0.44–1.00)
GFR, Estimated: >60 mL/min (ref >60)
Glucose, Bld: 174 mg/dL — ABNORMAL HIGH (ref 70–99)
Potassium: 3.8 mmol/L (ref 3.5–5.1)
Sodium: 137 mmol/L (ref 135–145)
Total Bilirubin: 0.3 mg/dL (ref 0.3–1.2)
Total Protein: 6.9 g/dL (ref 6.5–8.1)

## 2022-04-13 LAB — CBC WITH DIFFERENTIAL (CANCER CENTER ONLY)
Abs Immature Granulocytes: 0.01 10*3/uL (ref 0.00–0.07)
Basophils Absolute: 0 10*3/uL (ref 0.0–0.1)
Basophils Relative: 1 %
Eosinophils Absolute: 0 10*3/uL (ref 0.0–0.5)
Eosinophils Relative: 0 %
HCT: 28.2 % — ABNORMAL LOW (ref 36.0–46.0)
Hemoglobin: 9.6 g/dL — ABNORMAL LOW (ref 12.0–15.0)
Immature Granulocytes: 1 %
Lymphocytes Relative: 48 %
Lymphs Abs: 1.1 10*3/uL (ref 0.7–4.0)
MCH: 35.2 pg — ABNORMAL HIGH (ref 26.0–34.0)
MCHC: 34 g/dL (ref 30.0–36.0)
MCV: 103.3 fL — ABNORMAL HIGH (ref 80.0–100.0)
Monocytes Absolute: 0.1 10*3/uL (ref 0.1–1.0)
Monocytes Relative: 7 %
Neutro Abs: 0.9 10*3/uL — ABNORMAL LOW (ref 1.7–7.7)
Neutrophils Relative %: 43 %
Platelet Count: 100 10*3/uL — ABNORMAL LOW (ref 150–400)
RBC: 2.73 MIL/uL — ABNORMAL LOW (ref 3.87–5.11)
RDW: 16.2 % — ABNORMAL HIGH (ref 11.5–15.5)
Smear Review: NORMAL
WBC Count: 2.1 10*3/uL — ABNORMAL LOW (ref 4.0–10.5)
nRBC: 0 % (ref 0.0–0.2)

## 2022-04-13 LAB — TSH: TSH: 3.084 u[IU]/mL (ref 0.350–4.500)

## 2022-04-13 MED ORDER — HEPARIN SOD (PORK) LOCK FLUSH 100 UNIT/ML IV SOLN
500.0000 [IU] | Freq: Once | INTRAVENOUS | Status: AC
  Administered 2022-04-13: 500 [IU] via INTRAVENOUS

## 2022-04-13 MED ORDER — SODIUM CHLORIDE 0.9% FLUSH
10.0000 mL | INTRAVENOUS | Status: DC | PRN
  Administered 2022-04-13: 10 mL via INTRAVENOUS

## 2022-04-13 NOTE — Patient Instructions (Signed)

## 2022-04-13 NOTE — Addendum Note (Signed)
Addended by: [place] Morelle on: 04/13/2022 12:45 PM   Modules accepted: Orders

## 2022-04-13 NOTE — Progress Notes (Signed)
No treatment today due to low WBC count per Dr Marin Olp.

## 2022-04-14 ENCOUNTER — Other Ambulatory Visit: Payer: Self-pay | Admitting: Hematology & Oncology

## 2022-04-14 ENCOUNTER — Other Ambulatory Visit (HOSPITAL_BASED_OUTPATIENT_CLINIC_OR_DEPARTMENT_OTHER): Payer: Self-pay

## 2022-04-14 DIAGNOSIS — M25512 Pain in left shoulder: Secondary | ICD-10-CM

## 2022-04-14 DIAGNOSIS — C50011 Malignant neoplasm of nipple and areola, right female breast: Secondary | ICD-10-CM

## 2022-04-14 LAB — T4: T4, Total: 5.4 ug/dL (ref 4.5–12.0)

## 2022-04-14 MED ORDER — HYDROCODONE-ACETAMINOPHEN 7.5-325 MG PO TABS
1.0000 | ORAL_TABLET | ORAL | 0 refills | Status: DC | PRN
  Filled 2022-04-14: qty 90, 15d supply, fill #0

## 2022-04-19 ENCOUNTER — Encounter: Payer: Self-pay | Admitting: *Deleted

## 2022-04-20 ENCOUNTER — Inpatient Hospital Stay: Payer: Medicaid Other

## 2022-04-20 VITALS — BP 125/70 | HR 88 | Temp 98.4°F | Resp 18

## 2022-04-20 DIAGNOSIS — C50011 Malignant neoplasm of nipple and areola, right female breast: Secondary | ICD-10-CM

## 2022-04-20 DIAGNOSIS — Z5111 Encounter for antineoplastic chemotherapy: Secondary | ICD-10-CM | POA: Diagnosis not present

## 2022-04-20 LAB — CMP (CANCER CENTER ONLY)
ALT: 11 U/L (ref 0–44)
AST: 13 U/L — ABNORMAL LOW (ref 15–41)
Albumin: 4.1 g/dL (ref 3.5–5.0)
Alkaline Phosphatase: 59 U/L (ref 38–126)
Anion gap: 6 (ref 5–15)
BUN: 11 mg/dL (ref 6–20)
CO2: 24 mmol/L (ref 22–32)
Calcium: 9 mg/dL (ref 8.9–10.3)
Chloride: 108 mmol/L (ref 98–111)
Creatinine: 0.76 mg/dL (ref 0.44–1.00)
GFR, Estimated: >60 mL/min (ref >60)
Glucose, Bld: 82 mg/dL (ref 70–99)
Potassium: 4.1 mmol/L (ref 3.5–5.1)
Sodium: 138 mmol/L (ref 135–145)
Total Bilirubin: 0.3 mg/dL (ref 0.3–1.2)
Total Protein: 6.6 g/dL (ref 6.5–8.1)

## 2022-04-20 LAB — CBC WITH DIFFERENTIAL (CANCER CENTER ONLY)
Abs Immature Granulocytes: 0.02 10*3/uL (ref 0.00–0.07)
Basophils Absolute: 0 10*3/uL (ref 0.0–0.1)
Basophils Relative: 0 %
Eosinophils Absolute: 0 10*3/uL (ref 0.0–0.5)
Eosinophils Relative: 0 %
HCT: 29.1 % — ABNORMAL LOW (ref 36.0–46.0)
Hemoglobin: 9.7 g/dL — ABNORMAL LOW (ref 12.0–15.0)
Immature Granulocytes: 1 %
Lymphocytes Relative: 34 %
Lymphs Abs: 1.1 10*3/uL (ref 0.7–4.0)
MCH: 35.5 pg — ABNORMAL HIGH (ref 26.0–34.0)
MCHC: 33.3 g/dL (ref 30.0–36.0)
MCV: 106.6 fL — ABNORMAL HIGH (ref 80.0–100.0)
Monocytes Absolute: 0.5 10*3/uL (ref 0.1–1.0)
Monocytes Relative: 17 %
Neutro Abs: 1.6 10*3/uL — ABNORMAL LOW (ref 1.7–7.7)
Neutrophils Relative %: 48 %
Platelet Count: 115 10*3/uL — ABNORMAL LOW (ref 150–400)
RBC: 2.73 MIL/uL — ABNORMAL LOW (ref 3.87–5.11)
RDW: 17.4 % — ABNORMAL HIGH (ref 11.5–15.5)
Smear Review: NORMAL
WBC Count: 3.3 10*3/uL — ABNORMAL LOW (ref 4.0–10.5)
nRBC: 0 % (ref 0.0–0.2)

## 2022-04-20 LAB — TSH: TSH: 1.479 u[IU]/mL (ref 0.350–4.500)

## 2022-04-20 MED ORDER — SODIUM CHLORIDE 0.9% FLUSH
10.0000 mL | INTRAVENOUS | Status: DC | PRN
  Administered 2022-04-20: 10 mL

## 2022-04-20 MED ORDER — SODIUM CHLORIDE 0.9 % IV SOLN
10.0000 mg | Freq: Once | INTRAVENOUS | Status: AC
  Administered 2022-04-20: 10 mg via INTRAVENOUS
  Filled 2022-04-20: qty 10

## 2022-04-20 MED ORDER — SODIUM CHLORIDE 0.9 % IV SOLN
273.6000 mg | Freq: Once | INTRAVENOUS | Status: AC
  Administered 2022-04-20: 270 mg via INTRAVENOUS
  Filled 2022-04-20: qty 27

## 2022-04-20 MED ORDER — HEPARIN SOD (PORK) LOCK FLUSH 100 UNIT/ML IV SOLN
500.0000 [IU] | Freq: Once | INTRAVENOUS | Status: AC | PRN
  Administered 2022-04-20: 500 [IU]

## 2022-04-20 MED ORDER — SODIUM CHLORIDE 0.9 % IV SOLN: Freq: Once | INTRAVENOUS | Status: DC

## 2022-04-20 MED ORDER — PALONOSETRON HCL INJECTION 0.25 MG/5ML
0.2500 mg | Freq: Once | INTRAVENOUS | Status: AC
  Administered 2022-04-20: 0.25 mg via INTRAVENOUS
  Filled 2022-04-20: qty 5

## 2022-04-20 MED ORDER — SODIUM CHLORIDE 0.9 % IV SOLN: Freq: Once | INTRAVENOUS | Status: AC

## 2022-04-20 MED ORDER — SODIUM CHLORIDE 0.9 % IV SOLN
800.0000 mg/m2 | Freq: Once | INTRAVENOUS | Status: AC
  Administered 2022-04-20: 1558 mg via INTRAVENOUS
  Filled 2022-04-20: qty 26.3

## 2022-04-21 LAB — T4: T4, Total: 5 ug/dL (ref 4.5–12.0)

## 2022-04-23 ENCOUNTER — Encounter: Payer: Self-pay | Admitting: Hematology & Oncology

## 2022-04-23 ENCOUNTER — Other Ambulatory Visit: Payer: Self-pay | Admitting: Hematology & Oncology

## 2022-04-23 DIAGNOSIS — C50011 Malignant neoplasm of nipple and areola, right female breast: Secondary | ICD-10-CM

## 2022-04-23 DIAGNOSIS — M25512 Pain in left shoulder: Secondary | ICD-10-CM

## 2022-04-26 ENCOUNTER — Other Ambulatory Visit (HOSPITAL_BASED_OUTPATIENT_CLINIC_OR_DEPARTMENT_OTHER): Payer: Self-pay

## 2022-04-26 ENCOUNTER — Other Ambulatory Visit: Payer: Self-pay | Admitting: Hematology & Oncology

## 2022-04-26 DIAGNOSIS — C50011 Malignant neoplasm of nipple and areola, right female breast: Secondary | ICD-10-CM

## 2022-04-26 DIAGNOSIS — M25512 Pain in left shoulder: Secondary | ICD-10-CM

## 2022-04-26 MED ORDER — HYDROCODONE-ACETAMINOPHEN 7.5-325 MG PO TABS
1.0000 | ORAL_TABLET | ORAL | 0 refills | Status: DC | PRN
  Filled 2022-04-26 – 2022-04-27 (×2): qty 90, 15d supply, fill #0

## 2022-04-27 ENCOUNTER — Other Ambulatory Visit (HOSPITAL_BASED_OUTPATIENT_CLINIC_OR_DEPARTMENT_OTHER): Payer: Self-pay

## 2022-04-28 ENCOUNTER — Other Ambulatory Visit (HOSPITAL_BASED_OUTPATIENT_CLINIC_OR_DEPARTMENT_OTHER): Payer: Self-pay

## 2022-04-29 ENCOUNTER — Telehealth: Payer: Self-pay | Admitting: *Deleted

## 2022-04-29 NOTE — Progress Notes (Signed)
Patient will continue pembrolizumab 400 mg q6w. Orders entered per Dr. Antonieta Pert instructions.

## 2022-04-29 NOTE — Telephone Encounter (Signed)
Called and lvm about rescheduling appointments - requested callback to confirm

## 2022-05-03 ENCOUNTER — Other Ambulatory Visit: Payer: Self-pay | Admitting: Hematology & Oncology

## 2022-05-05 ENCOUNTER — Encounter (HOSPITAL_COMMUNITY)
Admission: RE | Admit: 2022-05-05 | Discharge: 2022-05-05 | Disposition: A | Discharge status: Home / Self Care | Payer: Medicaid Other | Source: Ambulatory Visit | Attending: Hematology & Oncology | Admitting: Hematology & Oncology

## 2022-05-05 DIAGNOSIS — C50011 Malignant neoplasm of nipple and areola, right female breast: Secondary | ICD-10-CM | POA: Insufficient documentation

## 2022-05-05 LAB — GLUCOSE, CAPILLARY: Glucose-Capillary: 122 mg/dL — ABNORMAL HIGH (ref 70–99)

## 2022-05-05 MED ORDER — FLUDEOXYGLUCOSE F - 18 (FDG) INJECTION
10.0000 | Freq: Once | INTRAVENOUS | Status: AC | PRN
Start: 2022-05-05 — End: 2022-05-05
  Administered 2022-05-05: 9.7 via INTRAVENOUS

## 2022-05-06 ENCOUNTER — Inpatient Hospital Stay: Payer: Medicaid Other | Attending: Hematology & Oncology

## 2022-05-06 ENCOUNTER — Encounter: Payer: Self-pay | Admitting: *Deleted

## 2022-05-06 ENCOUNTER — Inpatient Hospital Stay (HOSPITAL_BASED_OUTPATIENT_CLINIC_OR_DEPARTMENT_OTHER): Payer: Medicaid Other | Admitting: Family

## 2022-05-06 ENCOUNTER — Inpatient Hospital Stay: Payer: Medicaid Other

## 2022-05-06 VITALS — BP 123/65 | HR 106 | Temp 98.3°F | Resp 18 | Ht 62.0 in | Wt 188.0 lb

## 2022-05-06 VITALS — BP 128/74 | HR 102 | Temp 98.0°F | Resp 19

## 2022-05-06 DIAGNOSIS — Z171 Estrogen receptor negative status [ER-]: Secondary | ICD-10-CM | POA: Diagnosis not present

## 2022-05-06 DIAGNOSIS — Z5112 Encounter for antineoplastic immunotherapy: Secondary | ICD-10-CM | POA: Diagnosis present

## 2022-05-06 DIAGNOSIS — C50212 Malignant neoplasm of upper-inner quadrant of left female breast: Secondary | ICD-10-CM

## 2022-05-06 DIAGNOSIS — Z79899 Other long term (current) drug therapy: Secondary | ICD-10-CM | POA: Insufficient documentation

## 2022-05-06 DIAGNOSIS — Z5111 Encounter for antineoplastic chemotherapy: Secondary | ICD-10-CM | POA: Insufficient documentation

## 2022-05-06 DIAGNOSIS — C50912 Malignant neoplasm of unspecified site of left female breast: Secondary | ICD-10-CM | POA: Diagnosis present

## 2022-05-06 DIAGNOSIS — G629 Polyneuropathy, unspecified: Secondary | ICD-10-CM | POA: Diagnosis not present

## 2022-05-06 DIAGNOSIS — C50011 Malignant neoplasm of nipple and areola, right female breast: Secondary | ICD-10-CM

## 2022-05-06 LAB — CMP (CANCER CENTER ONLY)
ALT: 11 U/L (ref 0–44)
AST: 13 U/L — ABNORMAL LOW (ref 15–41)
Albumin: 4.4 g/dL (ref 3.5–5.0)
Alkaline Phosphatase: 56 U/L (ref 38–126)
Anion gap: 7 (ref 5–15)
BUN: 19 mg/dL (ref 6–20)
CO2: 24 mmol/L (ref 22–32)
Calcium: 9.4 mg/dL (ref 8.9–10.3)
Chloride: 106 mmol/L (ref 98–111)
Creatinine: 0.85 mg/dL (ref 0.44–1.00)
GFR, Estimated: >60 mL/min (ref >60)
Glucose, Bld: 95 mg/dL (ref 70–99)
Potassium: 4.2 mmol/L (ref 3.5–5.1)
Sodium: 137 mmol/L (ref 135–145)
Total Bilirubin: 0.4 mg/dL (ref 0.3–1.2)
Total Protein: 6.5 g/dL (ref 6.5–8.1)

## 2022-05-06 LAB — SAMPLE TO BLOOD BANK

## 2022-05-06 LAB — CBC WITH DIFFERENTIAL (CANCER CENTER ONLY)
Abs Immature Granulocytes: 0.01 10*3/uL (ref 0.00–0.07)
Basophils Absolute: 0 10*3/uL (ref 0.0–0.1)
Basophils Relative: 0 %
Eosinophils Absolute: 0 10*3/uL (ref 0.0–0.5)
Eosinophils Relative: 0 %
HCT: 29.7 % — ABNORMAL LOW (ref 36.0–46.0)
Hemoglobin: 9.7 g/dL — ABNORMAL LOW (ref 12.0–15.0)
Immature Granulocytes: 0 %
Lymphocytes Relative: 33 %
Lymphs Abs: 1 10*3/uL (ref 0.7–4.0)
MCH: 35.1 pg — ABNORMAL HIGH (ref 26.0–34.0)
MCHC: 32.7 g/dL (ref 30.0–36.0)
MCV: 107.6 fL — ABNORMAL HIGH (ref 80.0–100.0)
Monocytes Absolute: 0.6 10*3/uL (ref 0.1–1.0)
Monocytes Relative: 21 %
Neutro Abs: 1.4 10*3/uL — ABNORMAL LOW (ref 1.7–7.7)
Neutrophils Relative %: 46 %
Platelet Count: 70 10*3/uL — ABNORMAL LOW (ref 150–400)
RBC: 2.76 MIL/uL — ABNORMAL LOW (ref 3.87–5.11)
RDW: 18 % — ABNORMAL HIGH (ref 11.5–15.5)
Smear Review: NORMAL
WBC Count: 3 10*3/uL — ABNORMAL LOW (ref 4.0–10.5)
nRBC: 0 % (ref 0.0–0.2)

## 2022-05-06 LAB — IRON AND IRON BINDING CAPACITY (CC-WL,HP ONLY)
Iron: 70 ug/dL (ref 28–170)
Saturation Ratios: 16 % (ref 10.4–31.8)
TIBC: 434 ug/dL (ref 250–450)
UIBC: 364 ug/dL (ref 148–442)

## 2022-05-06 LAB — RETICULOCYTES
Immature Retic Fract: 30 % — ABNORMAL HIGH (ref 2.3–15.9)
RBC.: 2.71 MIL/uL — ABNORMAL LOW (ref 3.87–5.11)
Retic Count, Absolute: 183.2 10*3/uL (ref 19.0–186.0)
Retic Ct Pct: 6.8 % — ABNORMAL HIGH (ref 0.4–3.1)

## 2022-05-06 LAB — FERRITIN: Ferritin: 38 ng/mL (ref 11–307)

## 2022-05-06 LAB — TSH: TSH: 1.725 u[IU]/mL (ref 0.350–4.500)

## 2022-05-06 MED ORDER — SODIUM CHLORIDE 0.9% FLUSH
10.0000 mL | Freq: Once | INTRAVENOUS | Status: AC
  Administered 2022-05-06: 10 mL via INTRAVENOUS

## 2022-05-06 MED ORDER — HEPARIN SOD (PORK) LOCK FLUSH 100 UNIT/ML IV SOLN
500.0000 [IU] | Freq: Once | INTRAVENOUS | Status: AC
  Administered 2022-05-06: 500 [IU] via INTRAVENOUS

## 2022-05-06 NOTE — Progress Notes (Signed)
Hematology and Oncology Follow Up Visit  Ariana Herrera [id number] 07-16-1966 56 y.o. 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 Ariana Herrera is here today for follow-up and treatment. Bilateral breast exam today was negative. No mass, lesion or rash noted.  She still has a little left axillary tenderness on exam but no abnormality noted.  No adenopathy or lymphedema noted.  PET scan yesterday showed stable post treatment changes and no evidence of recurrent or metastatic disease.  She has mild SOB with exertion.  She has had a cough with sinus congestion and clear phlegm. She notes occasional chest tightness and worsening of SOB while we were experiencing smoke in the air due to the firs in [place].  No fever, chills, n/v, rash, dizziness, palpitations, abdominal pain or changes in bowel or bladder habits.  No blood loss, bruising or petechiae noted.  She has arthritis in her lower back that developed after a previous injury. She notes that her legs also feel weak at times.   She notes mild fluid retention in her feet and ankles that waxes and wanes.  Neuropathy in her hands and feet is unchanged from baseline.  No falls or syncope to report.  Appetite and hydration are good. Her weight is stable at 188 lbs.   ECOG Performance Status: 1 - Symptomatic but completely ambulatory  Medications:  Allergies as of 05/06/2022       Reactions   Lithium Other (See Comments)   Spinal fluid built up in brain   Trulicity [dulaglutide] Nausea And Vomiting   Penicillins Other (See Comments)   UNKNOWN CHILDHOOD REACTION        Medication List        Accurate as of May 06, 2022 11:37 AM. If you have any questions, ask your nurse or doctor.          Accu-Chek Guide test strip Generic drug:  glucose blood 3 (three) times daily.   albuterol 108 (90 Base) MCG/ACT inhaler Commonly known as: VENTOLIN HFA Inhale 2 puffs into the lungs every 6 (six) hours as needed for wheezing or shortness of breath.   B-D UF III MINI PEN NEEDLES 31G X 5 MM Misc Generic drug: Insulin Pen Needle Use to administer insulin 4 times daily   fenofibrate 145 MG tablet Commonly known as: TRICOR Take 145 mg by mouth daily.   FreeStyle Libre 2 Sensor Misc   HYDROcodone-acetaminophen 7.5-325 MG tablet Commonly known as: Norco Take 1 tablet by mouth every 4 (four) hours as needed for moderate pain.   insulin glargine 100 UNIT/ML injection Commonly known as: LANTUS Inject 25 Units into the skin at bedtime.   lidocaine-prilocaine cream Commonly known as: EMLA Apply to affected area once   losartan 25 MG tablet Commonly known as: COZAAR Take 1 tablet (25 mg total) by mouth daily. What changed: how much to take   metFORMIN 1000 MG tablet Commonly known as: GLUCOPHAGE Take 1,000 mg by mouth 2 (two) times daily with a meal.   metoprolol succinate 25 MG 24 hr tablet Commonly known as: TOPROL-XL TAKE 1 AND 1/2 TABLETS BY MOUTH EVERY DAY What changed: how much to take   nitroGLYCERIN 0.4 MG SL tablet Commonly known as: NITROSTAT Place 1 tablet (0.4 mg total) under the tongue every 5 (five) minutes as  needed for chest pain.   NovoLOG FlexPen 100 UNIT/ML FlexPen Generic drug: insulin aspart Inject 3 Units into the skin 3 (three) times daily with meals. What changed: how much to take   OLANZapine 10 MG tablet Commonly known as: ZYPREXA TAKE 1 TABLET BY MOUTH EVERYDAY AT BEDTIME   omeprazole 20 MG capsule Commonly known as: PRILOSEC Take 20 mg by mouth daily.   ondansetron 8 MG tablet Commonly known as: Zofran Take 1 tablet (8 mg total) by mouth 2 (two) times daily as needed for refractory nausea / vomiting. Start on day 3 after chemotherapy.   pravastatin 20 MG tablet Commonly known  as: PRAVACHOL Take 20 mg by mouth daily. 03/09/2022 Patient states is taking only Pravastatin.   prochlorperazine 10 MG tablet Commonly known as: COMPAZINE Take 1 tablet (10 mg total) by mouth every 6 (six) hours as needed (Nausea or vomiting).   QUEtiapine 400 MG tablet Commonly known as: SEROQUEL Take 800 mg by mouth at bedtime.        Allergies:  Allergies  Allergen Reactions   Lithium Other (See Comments)    Spinal fluid built up in brain   Trulicity [Dulaglutide] Nausea And Vomiting   Penicillins Other (See Comments)    UNKNOWN CHILDHOOD REACTION    Past Medical History, Surgical history, Social history, and Family History were reviewed and updated.  Review of Systems: All other 10 point review of systems is negative.   Physical Exam:  height is '5\' 2"'$  (1.575 m) and weight is 188 lb (85.3 kg). Her oral temperature is 98.3 F (36.8 C). Her blood pressure is 123/65 and her pulse is 106 (abnormal). Her respiration is 18 and oxygen saturation is 98%.   Wt Readings from Last 3 Encounters:  05/06/22 188 lb (85.3 kg)  04/06/22 194 lb (88 kg)  03/09/22 193 lb (87.5 kg)    Ocular: Sclerae unicteric, pupils equal, round and reactive to light Ear-nose-throat: Oropharynx clear, dentition fair Lymphatic: No cervical, supraclavicular or axillary adenopathy Lungs no rales or rhonchi, good excursion bilaterally Heart regular rate and rhythm, no murmur appreciated Abd soft, nontender, positive bowel sounds MSK no focal spinal tenderness, no joint edema Neuro: non-focal, well-oriented, appropriate affect Breasts: Bilateral breast exam shows stable radiation and surgical changes to the left breast, no mass lesions or rash noted.   Lab Results  Component Value Date   WBC 3.0 (L) 05/06/2022   HGB 9.7 (L) 05/06/2022   HCT 29.7 (L) 05/06/2022   MCV 107.6 (H) 05/06/2022   PLT 70 (L) 05/06/2022   No results found for: "FERRITIN", "IRON", "TIBC", "UIBC", "IRONPCTSAT" Lab Results   Component Value Date   RETICCTPCT 6.8 (H) 05/06/2022   RBC 2.76 (L) 05/06/2022   RBC 2.71 (L) 05/06/2022   No results found for: "KPAFRELGTCHN", "LAMBDASER", "KAPLAMBRATIO" No results found for: "IGGSERUM", "IGA", "IGMSERUM" No results found for: "TOTALPROTELP", "ALBUMINELP", "A1GS", "A2GS", "BETS", "BETA2SER", "GAMS", "MSPIKE", "SPEI"   Chemistry      Component Value Date/Time   NA 137 05/06/2022 1055   K 4.2 05/06/2022 1055   CL 106 05/06/2022 1055   CO2 24 05/06/2022 1055   BUN 19 05/06/2022 1055   CREATININE 0.85 05/06/2022 1055      Component Value Date/Time   CALCIUM 9.4 05/06/2022 1055   ALKPHOS 56 05/06/2022 1055   AST 13 (L) 05/06/2022 1055   ALT 11 05/06/2022 1055   BILITOT 0.4 05/06/2022 1055       Impression and Plan: Ms. Ariana Herrera is  a very pleasant is a very pleasant 56 yo caucasian female with recurrent ductal carcinoma of the left breast.  Today's PET scan showed post treatment changes and no evidence of recurrent or metastatic disease.  We will old treatment this week for her drop in platelet count from 115 to 70.  We will see her back in a week to recheck labs and resume treatment if platelets have improved.    Lottie Dawson, NP 7/13/202311:37 AM

## 2022-05-06 NOTE — Progress Notes (Signed)
No treatment per NP due to low plts.

## 2022-05-06 NOTE — Patient Instructions (Signed)

## 2022-05-06 NOTE — Patient Instructions (Signed)
No treatment d/t low plts.  Implanted [facility] Guide An implanted port is a device that is placed under the skin. It is usually placed in the chest. The device may vary based on the need. Implanted ports can be used to give IV medicine, to take blood, or to give fluids. You may have an implanted port if: You need IV medicine that would be irritating to the small veins in your hands or arms. You need IV medicines, such as chemotherapy, for a long period of time. You need IV nutrition for a long period of time. You may have fewer limitations when using a port than you would if you used other types of long-term IVs. You will also likely be able to return to normal activities after your incision heals. An implanted port has two main parts: Reservoir. The reservoir is the part where a needle is inserted to give medicines or draw blood. The reservoir is round. After the port is placed, it appears as a small, raised area under your skin. Catheter. The catheter is a small, thin tube that connects the reservoir to a vein. Medicine that is inserted into the reservoir goes into the catheter and then into the vein. How is my port accessed? To access your port: A numbing cream may be placed on the skin over the port site. Your health care provider will put on a mask and sterile gloves. The skin over your port will be cleaned carefully with a germ-killing soap and allowed to dry. Your health care provider will gently pinch the port and insert a needle into it. Your health care provider will check for a blood return to make sure the port is in the vein and is still working (patent). If your port needs to remain accessed to get medicine continuously (constant infusion), your health care provider will place a clear bandage (dressing) over the needle site. The dressing and needle will need to be changed every week, or as told by your health care provider. What is flushing? Flushing helps keep the port working.  Follow instructions from your health care provider about how and when to flush the port. Ports are usually flushed with saline solution or a medicine called heparin. The need for flushing will depend on how the port is used: If the port is only used from time to time to give medicines or draw blood, the port may need to be flushed: Before and after medicines have been given. Before and after blood has been drawn. As part of routine maintenance. Flushing may be recommended every 4-6 weeks. If a constant infusion is running, the port may not need to be flushed. Throw away any syringes in a disposal container that is meant for sharp items (sharps container). You can buy a sharps container from a pharmacy, or you can make one by using an empty hard plastic bottle with a cover. How long will my port stay implanted? The port can stay in for as long as your health care provider thinks it is needed. When it is time for the port to come out, a surgery will be done to remove it. The surgery will be similar to the procedure that was done to put the port in. Follow these instructions at home: Caring for your port and port site Flush your port as told by your health care provider. If you need an infusion over several days, follow instructions from your health care provider about how to take care of your port  site. Make sure you: Change your dressing as told by your health care provider. Wash your hands with soap and water for at least 20 seconds before and after you change your dressing. If soap and water are not available, use alcohol-based hand sanitizer. Place any used dressings or infusion bags into a plastic bag. Throw that bag in the trash. Keep the dressing that covers the needle clean and dry. Do not get it wet. Do not use scissors or sharp objects near the infusion tubing. Keep any external tubes clamped, unless they are being used. Check your port site every day for signs of infection. Check  for: Redness, swelling, or pain. Fluid or blood. Warmth. Pus or a bad smell. Protect the skin around the port site. Avoid wearing bra straps that rub or irritate the site. Protect the skin around your port from seat belts. Place a soft pad over your chest if needed. Bathe or shower as told by your health care provider. The site may get wet as long as you are not actively receiving an infusion. General instructions  Return to your normal activities as told by your health care provider. Ask your health care provider what activities are safe for you. Carry a medical alert card or wear a medical alert bracelet at all times. This will let health care providers know that you have an implanted port in case of an emergency. Where to find more information American Cancer Society: www.cancer.[place] of Clinical Oncology: www.cancer.net Contact a health care provider if: You have a fever or chills. You have redness, swelling, or pain at the port site. You have fluid or blood coming from your port site. Your incision feels warm to the touch. You have pus or a bad smell coming from the port site. Summary Implanted ports are usually placed in the chest for long-term IV access. Follow instructions from your health care provider about flushing the port and changing bandages (dressings). Take care of the area around your port by avoiding clothing that puts pressure on the area, and by watching for signs of infection. Protect the skin around your port from seat belts. Place a soft pad over your chest if needed. Contact a health care provider if you have a fever or you have redness, swelling, pain, fluid, or a bad smell at the port site. This information is not intended to replace advice given to you by your health care provider. Make sure you discuss any questions you have with your health care provider. Document Revised: 04/14/2021 Document Reviewed: 04/14/2021 Elsevier Patient Education   [name]

## 2022-05-07 LAB — T4: T4, Total: 5.2 ug/dL (ref 4.5–12.0)

## 2022-05-07 LAB — CANCER ANTIGEN 27.29: CA 27.29: 50.4 U/mL — ABNORMAL HIGH (ref 0.0–38.6)

## 2022-05-07 LAB — ERYTHROPOIETIN: Erythropoietin: 126.8 m[IU]/mL — ABNORMAL HIGH (ref 2.6–18.5)

## 2022-05-07 NOTE — Progress Notes (Unsigned)
PET shows no signs or new or progressive disease. Treatment held today for low platelets.   Oncology Nurse Navigator Documentation     05/06/2022   11:15 AM  Oncology Nurse Navigator Flowsheets  Navigator Follow Up Date: 06/10/2022  Navigator Follow Up Reason: Follow-up Appointment;Chemotherapy  Navigator Location [facility]  Navigator Encounter Type Treatment;Scan Review;Appt/Treatment Plan Review  Patient Visit Type MedOnc  Treatment Phase Active Tx  Barriers/Navigation Needs Coordination of Care;Education  Interventions Psycho-Social Support  Acuity Level 2-Minimal Needs (1-2 Barriers Identified)  Support Groups/Services Friends and Family  Time Spent with Patient 15

## 2022-05-10 ENCOUNTER — Other Ambulatory Visit: Payer: Self-pay | Admitting: Hematology & Oncology

## 2022-05-10 ENCOUNTER — Inpatient Hospital Stay: Payer: Medicaid Other | Admitting: Family

## 2022-05-10 ENCOUNTER — Other Ambulatory Visit (HOSPITAL_BASED_OUTPATIENT_CLINIC_OR_DEPARTMENT_OTHER): Payer: Self-pay

## 2022-05-10 ENCOUNTER — Encounter: Payer: Self-pay | Admitting: Hematology & Oncology

## 2022-05-10 ENCOUNTER — Inpatient Hospital Stay: Payer: Medicaid Other

## 2022-05-10 DIAGNOSIS — C50011 Malignant neoplasm of nipple and areola, right female breast: Secondary | ICD-10-CM

## 2022-05-10 DIAGNOSIS — M25512 Pain in left shoulder: Secondary | ICD-10-CM

## 2022-05-10 MED ORDER — HYDROCODONE-ACETAMINOPHEN 7.5-325 MG PO TABS
1.0000 | ORAL_TABLET | ORAL | 0 refills | Status: DC | PRN
  Filled 2022-05-10 – 2022-05-12 (×2): qty 90, 15d supply, fill #0

## 2022-05-12 ENCOUNTER — Other Ambulatory Visit (HOSPITAL_BASED_OUTPATIENT_CLINIC_OR_DEPARTMENT_OTHER): Payer: Self-pay

## 2022-05-13 ENCOUNTER — Inpatient Hospital Stay: Payer: Medicaid Other

## 2022-05-13 VITALS — BP 143/70 | HR 91 | Temp 97.1°F | Resp 18

## 2022-05-13 DIAGNOSIS — C50011 Malignant neoplasm of nipple and areola, right female breast: Secondary | ICD-10-CM

## 2022-05-13 DIAGNOSIS — Z5112 Encounter for antineoplastic immunotherapy: Secondary | ICD-10-CM | POA: Diagnosis not present

## 2022-05-13 LAB — CBC WITH DIFFERENTIAL (CANCER CENTER ONLY)
Abs Immature Granulocytes: 0.01 10*3/uL (ref 0.00–0.07)
Basophils Absolute: 0 10*3/uL (ref 0.0–0.1)
Basophils Relative: 1 %
Eosinophils Absolute: 0 10*3/uL (ref 0.0–0.5)
Eosinophils Relative: 0 %
HCT: 30.8 % — ABNORMAL LOW (ref 36.0–46.0)
Hemoglobin: 10.1 g/dL — ABNORMAL LOW (ref 12.0–15.0)
Immature Granulocytes: 0 %
Lymphocytes Relative: 35 %
Lymphs Abs: 1.3 10*3/uL (ref 0.7–4.0)
MCH: 34.4 pg — ABNORMAL HIGH (ref 26.0–34.0)
MCHC: 32.8 g/dL (ref 30.0–36.0)
MCV: 104.8 fL — ABNORMAL HIGH (ref 80.0–100.0)
Monocytes Absolute: 0.7 10*3/uL (ref 0.1–1.0)
Monocytes Relative: 20 %
Neutro Abs: 1.6 10*3/uL — ABNORMAL LOW (ref 1.7–7.7)
Neutrophils Relative %: 44 %
Platelet Count: 123 10*3/uL — ABNORMAL LOW (ref 150–400)
RBC: 2.94 MIL/uL — ABNORMAL LOW (ref 3.87–5.11)
RDW: 15.9 % — ABNORMAL HIGH (ref 11.5–15.5)
Smear Review: NORMAL
WBC Count: 3.7 10*3/uL — ABNORMAL LOW (ref 4.0–10.5)
nRBC: 0 % (ref 0.0–0.2)

## 2022-05-13 LAB — CMP (CANCER CENTER ONLY)
ALT: 17 U/L (ref 0–44)
AST: 21 U/L (ref 15–41)
Albumin: 4.4 g/dL (ref 3.5–5.0)
Alkaline Phosphatase: 59 U/L (ref 38–126)
Anion gap: 7 (ref 5–15)
BUN: 15 mg/dL (ref 6–20)
CO2: 24 mmol/L (ref 22–32)
Calcium: 9.6 mg/dL (ref 8.9–10.3)
Chloride: 106 mmol/L (ref 98–111)
Creatinine: 0.95 mg/dL (ref 0.44–1.00)
GFR, Estimated: >60 mL/min (ref >60)
Glucose, Bld: 105 mg/dL — ABNORMAL HIGH (ref 70–99)
Potassium: 3.8 mmol/L (ref 3.5–5.1)
Sodium: 137 mmol/L (ref 135–145)
Total Bilirubin: 0.4 mg/dL (ref 0.3–1.2)
Total Protein: 6.7 g/dL (ref 6.5–8.1)

## 2022-05-13 LAB — TSH: TSH: 3.008 u[IU]/mL (ref 0.350–4.500)

## 2022-05-13 MED ORDER — PALONOSETRON HCL INJECTION 0.25 MG/5ML
0.2500 mg | Freq: Once | INTRAVENOUS | Status: AC
  Administered 2022-05-13: 0.25 mg via INTRAVENOUS
  Filled 2022-05-13: qty 5

## 2022-05-13 MED ORDER — SODIUM CHLORIDE 0.9 % IV SOLN
400.0000 mg | Freq: Once | INTRAVENOUS | Status: AC
  Administered 2022-05-13: 400 mg via INTRAVENOUS
  Filled 2022-05-13: qty 16

## 2022-05-13 MED ORDER — SODIUM CHLORIDE 0.9% FLUSH
10.0000 mL | INTRAVENOUS | Status: DC | PRN
  Administered 2022-05-13: 10 mL

## 2022-05-13 MED ORDER — HEPARIN SOD (PORK) LOCK FLUSH 100 UNIT/ML IV SOLN
500.0000 [IU] | Freq: Once | INTRAVENOUS | Status: AC | PRN
  Administered 2022-05-13: 500 [IU]

## 2022-05-13 MED ORDER — SODIUM CHLORIDE 0.9 % IV SOLN: Freq: Once | INTRAVENOUS | Status: AC

## 2022-05-13 MED ORDER — SODIUM CHLORIDE 0.9% FLUSH: 10.0000 mL | Freq: Once | INTRAVENOUS | Status: DC

## 2022-05-13 MED ORDER — SODIUM CHLORIDE 0.9 % IV SOLN
10.0000 mg | Freq: Once | INTRAVENOUS | Status: AC
  Administered 2022-05-13: 10 mg via INTRAVENOUS
  Filled 2022-05-13: qty 10

## 2022-05-13 MED ORDER — SODIUM CHLORIDE 0.9 % IV SOLN
238.4000 mg | Freq: Once | INTRAVENOUS | Status: AC
  Administered 2022-05-13: 240 mg via INTRAVENOUS
  Filled 2022-05-13: qty 24

## 2022-05-13 MED ORDER — SODIUM CHLORIDE 0.9 % IV SOLN
800.0000 mg/m2 | Freq: Once | INTRAVENOUS | Status: AC
  Administered 2022-05-13: 1558 mg via INTRAVENOUS
  Filled 2022-05-13: qty 26.3

## 2022-05-13 NOTE — Patient Instructions (Signed)
[facility]  Discharge Instructions: Thank you for choosing [facility] to provide your oncology and hematology care.   If you have a lab appointment with the [facility], please go directly to the [facility] and check in at the registration area.  Wear comfortable clothing and clothing appropriate for easy access to any Portacath or PICC line.   We strive to give you quality time with your provider. You may need to reschedule your appointment if you arrive late (15 or more minutes).  Arriving late affects you and other patients whose appointments are after yours.  Also, if you miss three or more appointments without notifying the office, you may be dismissed from the clinic at the provider's discretion.      For prescription refill requests, have your pharmacy contact our office and allow 72 hours for refills to be completed.    Today you received the following chemotherapy and/or immunotherapy agents gemzar, keytruda, carboplatin   To help prevent nausea and vomiting after your treatment, we encourage you to take your nausea medication as directed.  BELOW ARE SYMPTOMS THAT SHOULD BE REPORTED IMMEDIATELY: *FEVER GREATER THAN 100.4 F (38 C) OR HIGHER *CHILLS OR SWEATING *NAUSEA AND VOMITING THAT IS NOT CONTROLLED WITH YOUR NAUSEA MEDICATION *UNUSUAL SHORTNESS OF BREATH *UNUSUAL BRUISING OR BLEEDING *URINARY PROBLEMS (pain or burning when urinating, or frequent urination) *BOWEL PROBLEMS (unusual diarrhea, constipation, pain near the anus) TENDERNESS IN MOUTH AND THROAT WITH OR WITHOUT PRESENCE OF ULCERS (sore throat, sores in mouth, or a toothache) UNUSUAL RASH, SWELLING OR PAIN  UNUSUAL VAGINAL DISCHARGE OR ITCHING   Items with * indicate a potential emergency and should be followed up as soon as possible or go to the Emergency Department if any problems should occur.  Please show the CHEMOTHERAPY ALERT CARD or IMMUNOTHERAPY ALERT CARD at  check-in to the Emergency Department and triage nurse. Should you have questions after your visit or need to cancel or reschedule your appointment, please contact [facility]  [phone] and follow the prompts.  Office hours are 8:00 a.m. to 4:30 p.m. Monday - Friday. Please note that voicemails left after 4:00 p.m. may not be returned until the following business day.  We are closed weekends and major holidays. You have access to a nurse at all times for urgent questions. Please call the main number to the clinic [phone] and follow the prompts.  For any non-urgent questions, you may also contact your provider using MyChart. We now offer e-Visits for anyone 63 and older to request care online for non-urgent symptoms. For details visit [email or web address].   Also download the MyChart app! Go to the app store, search "MyChart", open the app, select [facility], and log in with your MyChart username and password.  Masks are optional in the cancer centers. If you would like for your care team to wear a mask while they are taking care of you, please let them know. For doctor visits, patients may have with them one support person who is at least 54 years old. At this time, visitors are not allowed in the infusion area.

## 2022-05-14 LAB — T4: T4, Total: 5.9 ug/dL (ref 4.5–12.0)

## 2022-05-17 ENCOUNTER — Inpatient Hospital Stay: Payer: Medicaid Other

## 2022-05-17 ENCOUNTER — Other Ambulatory Visit: Payer: Self-pay

## 2022-05-19 NOTE — Progress Notes (Signed)
Cycle 7, Day 8 of chemotherapy careplan moved to 8/1 per Dr. Antonieta Pert instructions.

## 2022-05-20 ENCOUNTER — Other Ambulatory Visit: Payer: Medicaid Other

## 2022-05-20 ENCOUNTER — Ambulatory Visit: Payer: Medicaid Other

## 2022-05-20 ENCOUNTER — Inpatient Hospital Stay: Payer: Medicaid Other

## 2022-05-25 ENCOUNTER — Inpatient Hospital Stay: Payer: Medicaid Other | Attending: Hematology & Oncology

## 2022-05-25 ENCOUNTER — Other Ambulatory Visit: Payer: Self-pay | Admitting: Hematology & Oncology

## 2022-05-25 ENCOUNTER — Other Ambulatory Visit: Payer: Self-pay

## 2022-05-25 ENCOUNTER — Inpatient Hospital Stay: Payer: Medicaid Other

## 2022-05-25 ENCOUNTER — Other Ambulatory Visit (HOSPITAL_BASED_OUTPATIENT_CLINIC_OR_DEPARTMENT_OTHER): Payer: Self-pay

## 2022-05-25 DIAGNOSIS — Z79899 Other long term (current) drug therapy: Secondary | ICD-10-CM | POA: Insufficient documentation

## 2022-05-25 DIAGNOSIS — Z171 Estrogen receptor negative status [ER-]: Secondary | ICD-10-CM | POA: Insufficient documentation

## 2022-05-25 DIAGNOSIS — F17210 Nicotine dependence, cigarettes, uncomplicated: Secondary | ICD-10-CM | POA: Diagnosis not present

## 2022-05-25 DIAGNOSIS — C50912 Malignant neoplasm of unspecified site of left female breast: Secondary | ICD-10-CM | POA: Insufficient documentation

## 2022-05-25 DIAGNOSIS — Z5111 Encounter for antineoplastic chemotherapy: Secondary | ICD-10-CM | POA: Diagnosis present

## 2022-05-25 DIAGNOSIS — C50011 Malignant neoplasm of nipple and areola, right female breast: Secondary | ICD-10-CM

## 2022-05-25 DIAGNOSIS — M25512 Pain in left shoulder: Secondary | ICD-10-CM

## 2022-05-25 DIAGNOSIS — Z452 Encounter for adjustment and management of vascular access device: Secondary | ICD-10-CM | POA: Insufficient documentation

## 2022-05-25 LAB — CBC WITH DIFFERENTIAL (CANCER CENTER ONLY)
Abs Immature Granulocytes: 0.03 10*3/uL (ref 0.00–0.07)
Basophils Absolute: 0 10*3/uL (ref 0.0–0.1)
Basophils Relative: 0 %
Eosinophils Absolute: 0 10*3/uL (ref 0.0–0.5)
Eosinophils Relative: 0 %
HCT: 27.5 % — ABNORMAL LOW (ref 36.0–46.0)
Hemoglobin: 9.4 g/dL — ABNORMAL LOW (ref 12.0–15.0)
Immature Granulocytes: 1 %
Lymphocytes Relative: 28 %
Lymphs Abs: 1 10*3/uL (ref 0.7–4.0)
MCH: 35.3 pg — ABNORMAL HIGH (ref 26.0–34.0)
MCHC: 34.2 g/dL (ref 30.0–36.0)
MCV: 103.4 fL — ABNORMAL HIGH (ref 80.0–100.0)
Monocytes Absolute: 0.5 10*3/uL (ref 0.1–1.0)
Monocytes Relative: 13 %
Neutro Abs: 2 10*3/uL (ref 1.7–7.7)
Neutrophils Relative %: 58 %
Platelet Count: 37 10*3/uL — ABNORMAL LOW (ref 150–400)
RBC: 2.66 MIL/uL — ABNORMAL LOW (ref 3.87–5.11)
RDW: 15.4 % (ref 11.5–15.5)
WBC Count: 3.5 10*3/uL — ABNORMAL LOW (ref 4.0–10.5)
nRBC: 0 % (ref 0.0–0.2)

## 2022-05-25 LAB — CMP (CANCER CENTER ONLY)
ALT: 23 U/L (ref 0–44)
AST: 27 U/L (ref 15–41)
Albumin: 3.8 g/dL (ref 3.5–5.0)
Alkaline Phosphatase: 56 U/L (ref 38–126)
Anion gap: 6 (ref 5–15)
BUN: 13 mg/dL (ref 6–20)
CO2: 23 mmol/L (ref 22–32)
Calcium: 8.7 mg/dL — ABNORMAL LOW (ref 8.9–10.3)
Chloride: 107 mmol/L (ref 98–111)
Creatinine: 0.8 mg/dL (ref 0.44–1.00)
GFR, Estimated: >60 mL/min (ref >60)
Glucose, Bld: 162 mg/dL — ABNORMAL HIGH (ref 70–99)
Potassium: 3.9 mmol/L (ref 3.5–5.1)
Sodium: 136 mmol/L (ref 135–145)
Total Bilirubin: 0.5 mg/dL (ref 0.3–1.2)
Total Protein: 6.8 g/dL (ref 6.5–8.1)

## 2022-05-25 LAB — TSH: TSH: 2.111 u[IU]/mL (ref 0.350–4.500)

## 2022-05-25 MED ORDER — HYDROCODONE-ACETAMINOPHEN 7.5-325 MG PO TABS
1.0000 | ORAL_TABLET | ORAL | 0 refills | Status: DC | PRN
  Filled 2022-05-25 – 2022-05-27 (×2): qty 90, 15d supply, fill #0

## 2022-05-25 MED ORDER — SODIUM CHLORIDE 0.9% FLUSH
10.0000 mL | INTRAVENOUS | Status: DC | PRN
  Administered 2022-05-25: 10 mL via INTRAVENOUS

## 2022-05-25 MED ORDER — HEPARIN SOD (PORK) LOCK FLUSH 100 UNIT/ML IV SOLN
500.0000 [IU] | Freq: Once | INTRAVENOUS | Status: AC
  Administered 2022-05-25: 500 [IU] via INTRAVENOUS

## 2022-05-25 NOTE — Progress Notes (Signed)
No treatment today per order of Dr. Marin Olp for platelet count -37.

## 2022-05-25 NOTE — Patient Instructions (Signed)

## 2022-05-25 NOTE — Addendum Note (Signed)
Addended by: [place] on: 05/25/2022 12:18 PM   Modules accepted: Orders

## 2022-05-26 LAB — T4: T4, Total: 4.9 ug/dL (ref 4.5–12.0)

## 2022-05-27 ENCOUNTER — Other Ambulatory Visit (HOSPITAL_BASED_OUTPATIENT_CLINIC_OR_DEPARTMENT_OTHER): Payer: Self-pay

## 2022-06-01 ENCOUNTER — Inpatient Hospital Stay: Payer: Medicaid Other

## 2022-06-01 VITALS — BP 110/65 | HR 93 | Temp 98.2°F | Resp 16

## 2022-06-01 DIAGNOSIS — C50011 Malignant neoplasm of nipple and areola, right female breast: Secondary | ICD-10-CM

## 2022-06-01 DIAGNOSIS — Z5111 Encounter for antineoplastic chemotherapy: Secondary | ICD-10-CM | POA: Diagnosis not present

## 2022-06-01 LAB — CBC WITH DIFFERENTIAL (CANCER CENTER ONLY)
Abs Immature Granulocytes: 0.01 10*3/uL (ref 0.00–0.07)
Basophils Absolute: 0 10*3/uL (ref 0.0–0.1)
Basophils Relative: 0 %
Eosinophils Absolute: 0 10*3/uL (ref 0.0–0.5)
Eosinophils Relative: 0 %
HCT: 29 % — ABNORMAL LOW (ref 36.0–46.0)
Hemoglobin: 9.6 g/dL — ABNORMAL LOW (ref 12.0–15.0)
Immature Granulocytes: 0 %
Lymphocytes Relative: 41 %
Lymphs Abs: 1.1 10*3/uL (ref 0.7–4.0)
MCH: 35.4 pg — ABNORMAL HIGH (ref 26.0–34.0)
MCHC: 33.1 g/dL (ref 30.0–36.0)
MCV: 107 fL — ABNORMAL HIGH (ref 80.0–100.0)
Monocytes Absolute: 0.4 10*3/uL (ref 0.1–1.0)
Monocytes Relative: 16 %
Neutro Abs: 1.1 10*3/uL — ABNORMAL LOW (ref 1.7–7.7)
Neutrophils Relative %: 43 %
Platelet Count: 101 10*3/uL — ABNORMAL LOW (ref 150–400)
RBC: 2.71 MIL/uL — ABNORMAL LOW (ref 3.87–5.11)
RDW: 17.2 % — ABNORMAL HIGH (ref 11.5–15.5)
Smear Review: NORMAL
WBC Count: 2.6 10*3/uL — ABNORMAL LOW (ref 4.0–10.5)
nRBC: 0.8 % — ABNORMAL HIGH (ref 0.0–0.2)

## 2022-06-01 LAB — CMP (CANCER CENTER ONLY)
ALT: 9 U/L (ref 0–44)
AST: 11 U/L — ABNORMAL LOW (ref 15–41)
Albumin: 4.2 g/dL (ref 3.5–5.0)
Alkaline Phosphatase: 50 U/L (ref 38–126)
Anion gap: 7 (ref 5–15)
BUN: 19 mg/dL (ref 6–20)
CO2: 23 mmol/L (ref 22–32)
Calcium: 9.3 mg/dL (ref 8.9–10.3)
Chloride: 107 mmol/L (ref 98–111)
Creatinine: 0.76 mg/dL (ref 0.44–1.00)
GFR, Estimated: >60 mL/min (ref >60)
Glucose, Bld: 54 mg/dL — ABNORMAL LOW (ref 70–99)
Potassium: 4.2 mmol/L (ref 3.5–5.1)
Sodium: 137 mmol/L (ref 135–145)
Total Bilirubin: 0.3 mg/dL (ref 0.3–1.2)
Total Protein: 6.3 g/dL — ABNORMAL LOW (ref 6.5–8.1)

## 2022-06-01 LAB — TSH: TSH: 2.199 u[IU]/mL (ref 0.350–4.500)

## 2022-06-01 MED ORDER — SODIUM CHLORIDE 0.9 % IV SOLN: Freq: Once | INTRAVENOUS | Status: AC

## 2022-06-01 MED ORDER — SODIUM CHLORIDE 0.9% FLUSH
10.0000 mL | INTRAVENOUS | Status: DC | PRN
  Administered 2022-06-01: 10 mL

## 2022-06-01 MED ORDER — PALONOSETRON HCL INJECTION 0.25 MG/5ML
0.2500 mg | Freq: Once | INTRAVENOUS | Status: AC
  Administered 2022-06-01: 0.25 mg via INTRAVENOUS
  Filled 2022-06-01: qty 5

## 2022-06-01 MED ORDER — SODIUM CHLORIDE 0.9 % IV SOLN
273.6000 mg | Freq: Once | INTRAVENOUS | Status: AC
  Administered 2022-06-01: 270 mg via INTRAVENOUS
  Filled 2022-06-01: qty 27

## 2022-06-01 MED ORDER — HEPARIN SOD (PORK) LOCK FLUSH 100 UNIT/ML IV SOLN
500.0000 [IU] | Freq: Once | INTRAVENOUS | Status: AC | PRN
  Administered 2022-06-01: 500 [IU]

## 2022-06-01 MED ORDER — SODIUM CHLORIDE 0.9 % IV SOLN
1600.0000 mg | Freq: Once | INTRAVENOUS | Status: AC
  Administered 2022-06-01: 1600 mg via INTRAVENOUS
  Filled 2022-06-01: qty 42.08

## 2022-06-01 MED ORDER — SODIUM CHLORIDE 0.9 % IV SOLN
10.0000 mg | Freq: Once | INTRAVENOUS | Status: AC
  Administered 2022-06-01: 10 mg via INTRAVENOUS
  Filled 2022-06-01: qty 10

## 2022-06-01 NOTE — Progress Notes (Signed)
Reviewed labs with Dr Marin Olp.  Ok to treat today with WNC 2.6 and ANC 1.1.

## 2022-06-01 NOTE — Patient Instructions (Signed)
[facility]  Discharge Instructions: Thank you for choosing [facility] to provide your oncology and hematology care.   If you have a lab appointment with the [facility], please go directly to the [facility] and check in at the registration area.  Wear comfortable clothing and clothing appropriate for easy access to any Portacath or PICC line.   We strive to give you quality time with your provider. You may need to reschedule your appointment if you arrive late (15 or more minutes).  Arriving late affects you and other patients whose appointments are after yours.  Also, if you miss three or more appointments without notifying the office, you may be dismissed from the clinic at the provider's discretion.      For prescription refill requests, have your pharmacy contact our office and allow 72 hours for refills to be completed.    Today you received the following chemotherapy and/or immunotherapy agents Carboplatin and Gemzar.      To help prevent nausea and vomiting after your treatment, we encourage you to take your nausea medication as directed.  BELOW ARE SYMPTOMS THAT SHOULD BE REPORTED IMMEDIATELY: *FEVER GREATER THAN 100.4 F (38 C) OR HIGHER *CHILLS OR SWEATING *NAUSEA AND VOMITING THAT IS NOT CONTROLLED WITH YOUR NAUSEA MEDICATION *UNUSUAL SHORTNESS OF BREATH *UNUSUAL BRUISING OR BLEEDING *URINARY PROBLEMS (pain or burning when urinating, or frequent urination) *BOWEL PROBLEMS (unusual diarrhea, constipation, pain near the anus) TENDERNESS IN MOUTH AND THROAT WITH OR WITHOUT PRESENCE OF ULCERS (sore throat, sores in mouth, or a toothache) UNUSUAL RASH, SWELLING OR PAIN  UNUSUAL VAGINAL DISCHARGE OR ITCHING   Items with * indicate a potential emergency and should be followed up as soon as possible or go to the Emergency Department if any problems should occur.  Please show the CHEMOTHERAPY ALERT CARD or IMMUNOTHERAPY ALERT CARD at  check-in to the Emergency Department and triage nurse. Should you have questions after your visit or need to cancel or reschedule your appointment, please contact [facility]  [phone] and follow the prompts.  Office hours are 8:00 a.m. to 4:30 p.m. Monday - Friday. Please note that voicemails left after 4:00 p.m. may not be returned until the following business day.  We are closed weekends and major holidays. You have access to a nurse at all times for urgent questions. Please call the main number to the clinic [phone] and follow the prompts.  For any non-urgent questions, you may also contact your provider using MyChart. We now offer e-Visits for anyone 17 and older to request care online for non-urgent symptoms. For details visit [email or web address].   Also download the MyChart app! Go to the app store, search "MyChart", open the app, select [facility], and log in with your MyChart username and password.  Masks are optional in the cancer centers. If you would like for your care team to wear a mask while they are taking care of you, please let them know. You may have one support person who is at least 54 years old accompany you for your appointments.

## 2022-06-02 LAB — T4: T4, Total: 4 ug/dL — ABNORMAL LOW (ref 4.5–12.0)

## 2022-06-07 ENCOUNTER — Ambulatory Visit: Payer: Medicaid Other | Admitting: Internal Medicine

## 2022-06-09 ENCOUNTER — Other Ambulatory Visit: Payer: Self-pay

## 2022-06-09 ENCOUNTER — Other Ambulatory Visit (HOSPITAL_BASED_OUTPATIENT_CLINIC_OR_DEPARTMENT_OTHER): Payer: Self-pay

## 2022-06-09 ENCOUNTER — Other Ambulatory Visit: Payer: Self-pay | Admitting: Hematology & Oncology

## 2022-06-09 DIAGNOSIS — C50011 Malignant neoplasm of nipple and areola, right female breast: Secondary | ICD-10-CM

## 2022-06-09 DIAGNOSIS — M25512 Pain in left shoulder: Secondary | ICD-10-CM

## 2022-06-09 MED ORDER — HYDROCODONE-ACETAMINOPHEN 7.5-325 MG PO TABS
1.0000 | ORAL_TABLET | ORAL | 0 refills | Status: DC | PRN
  Filled 2022-06-09: qty 90, 15d supply, fill #0

## 2022-06-10 ENCOUNTER — Encounter: Payer: Self-pay | Admitting: *Deleted

## 2022-06-10 ENCOUNTER — Other Ambulatory Visit: Payer: Self-pay

## 2022-06-10 ENCOUNTER — Inpatient Hospital Stay: Payer: Medicaid Other

## 2022-06-10 ENCOUNTER — Ambulatory Visit: Payer: Medicaid Other

## 2022-06-10 ENCOUNTER — Inpatient Hospital Stay (HOSPITAL_BASED_OUTPATIENT_CLINIC_OR_DEPARTMENT_OTHER): Payer: Medicaid Other | Admitting: Hematology & Oncology

## 2022-06-10 ENCOUNTER — Encounter: Payer: Self-pay | Admitting: Hematology & Oncology

## 2022-06-10 VITALS — BP 153/65 | HR 103 | Temp 98.0°F | Resp 18 | Ht 62.0 in | Wt 189.0 lb

## 2022-06-10 DIAGNOSIS — Z5111 Encounter for antineoplastic chemotherapy: Secondary | ICD-10-CM | POA: Diagnosis not present

## 2022-06-10 DIAGNOSIS — C50011 Malignant neoplasm of nipple and areola, right female breast: Secondary | ICD-10-CM

## 2022-06-10 DIAGNOSIS — C50212 Malignant neoplasm of upper-inner quadrant of left female breast: Secondary | ICD-10-CM

## 2022-06-10 LAB — CMP (CANCER CENTER ONLY)
ALT: 16 U/L (ref 0–44)
AST: 19 U/L (ref 15–41)
Albumin: 4.1 g/dL (ref 3.5–5.0)
Alkaline Phosphatase: 56 U/L (ref 38–126)
Anion gap: 7 (ref 5–15)
BUN: 15 mg/dL (ref 6–20)
CO2: 23 mmol/L (ref 22–32)
Calcium: 9.2 mg/dL (ref 8.9–10.3)
Chloride: 106 mmol/L (ref 98–111)
Creatinine: 0.75 mg/dL (ref 0.44–1.00)
GFR, Estimated: >60 mL/min (ref >60)
Glucose, Bld: 146 mg/dL — ABNORMAL HIGH (ref 70–99)
Potassium: 4.1 mmol/L (ref 3.5–5.1)
Sodium: 136 mmol/L (ref 135–145)
Total Bilirubin: 0.3 mg/dL (ref 0.3–1.2)
Total Protein: 6.8 g/dL (ref 6.5–8.1)

## 2022-06-10 LAB — CBC WITH DIFFERENTIAL (CANCER CENTER ONLY)
Abs Immature Granulocytes: 0.02 10*3/uL (ref 0.00–0.07)
Basophils Absolute: 0 10*3/uL (ref 0.0–0.1)
Basophils Relative: 0 %
Eosinophils Absolute: 0 10*3/uL (ref 0.0–0.5)
Eosinophils Relative: 0 %
HCT: 25 % — ABNORMAL LOW (ref 36.0–46.0)
Hemoglobin: 8.5 g/dL — ABNORMAL LOW (ref 12.0–15.0)
Immature Granulocytes: 1 %
Lymphocytes Relative: 26 %
Lymphs Abs: 0.6 10*3/uL — ABNORMAL LOW (ref 0.7–4.0)
MCH: 35.4 pg — ABNORMAL HIGH (ref 26.0–34.0)
MCHC: 34 g/dL (ref 30.0–36.0)
MCV: 104.2 fL — ABNORMAL HIGH (ref 80.0–100.0)
Monocytes Absolute: 0.4 10*3/uL (ref 0.1–1.0)
Monocytes Relative: 17 %
Neutro Abs: 1.3 10*3/uL — ABNORMAL LOW (ref 1.7–7.7)
Neutrophils Relative %: 56 %
Platelet Count: 25 10*3/uL — ABNORMAL LOW (ref 150–400)
RBC: 2.4 MIL/uL — ABNORMAL LOW (ref 3.87–5.11)
RDW: 15.3 % (ref 11.5–15.5)
Smear Review: NORMAL
WBC Count: 2.2 10*3/uL — ABNORMAL LOW (ref 4.0–10.5)
nRBC: 0.9 % — ABNORMAL HIGH (ref 0.0–0.2)

## 2022-06-10 LAB — PREPARE RBC (CROSSMATCH)

## 2022-06-10 LAB — TSH: TSH: 3.189 u[IU]/mL (ref 0.350–4.500)

## 2022-06-10 NOTE — Progress Notes (Signed)
Patient counts too low for treatment. Will receive transfusion tomorrow. Due to continued delays due to low counts, her regimen will be changed in hopes of better tolerability.   Oncology Nurse Navigator Documentation     06/10/2022   10:30 AM  Oncology Nurse Navigator Flowsheets  Navigator Follow Up Date: 07/15/2022  Navigator Follow Up Reason: Follow-up Appointment;Chemotherapy  Navigator Location [facility]  Navigator Encounter Type Treatment;Appt/Treatment Plan Review  Patient Visit Type MedOnc  Treatment Phase Active Tx  Barriers/Navigation Needs Coordination of Care;Education  Interventions Psycho-Social Support  Acuity Level 2-Minimal Needs (1-2 Barriers Identified)  Support Groups/Services Friends and Family  Time Spent with Patient 15

## 2022-06-10 NOTE — Progress Notes (Signed)
Hematology and Oncology Follow Up Visit  Ariana Herrera [id number] [birth year] 56 y.o. 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 Ariana Herrera is here today for follow-up and treatment.  Unfortunately, her blood counts just are not good enough for [place] to treat her.  We actually are going to have to give her a blood transfusion.  I really think going to have to change her protocols on her.  She just cannot handle the carboplatinum/Gemzar part of the protocol.  We will keep making delays in her treatment.  I does think that this is going to allow the cancer to develop resistance.  Her last CA 27.29 was 50.  This has tended to fluctuate.  Her last PET scan was done back in July.  There is no obvious evidence of metastatic disease.  She is still smoking.  She possibly about a pack per day.  She is trying to cut back.  She has no bleeding.  There is no change in bowel or bladder habits.  She has not had diarrhea.  Her pain is well controlled with Vicodin.  She has had a little bit of leg swelling.  There are been no rashes.  She has had no headache.  Overall, I would have said that her performance status right now is probably ECOG 1.   Medications:  Allergies as of 06/10/2022       Reactions   Lithium Other (See Comments)   Spinal fluid built up in brain   Trulicity [dulaglutide] Nausea And Vomiting   Penicillins Other (See Comments)   UNKNOWN CHILDHOOD REACTION        Medication List        Accurate as of June 10, 2022 11:12 AM. If you have any questions, ask your nurse or doctor.          Accu-Chek Guide test strip Generic drug: glucose blood 3 (three) times daily.   albuterol 108 (90 Base)  MCG/ACT inhaler Commonly known as: VENTOLIN HFA Inhale 2 puffs into the lungs every 6 (six) hours as needed for wheezing or shortness of breath.   B-D UF III MINI PEN NEEDLES 31G X 5 MM Misc Generic drug: Insulin Pen Needle Use to administer insulin 4 times daily   fenofibrate 145 MG tablet Commonly known as: TRICOR Take 145 mg by mouth daily.   FreeStyle Libre 2 Sensor Misc   HYDROcodone-acetaminophen 7.5-325 MG tablet Commonly known as: Norco Take 1 tablet by mouth every 4 (four) hours as needed for moderate pain.   insulin glargine 100 UNIT/ML injection Commonly known as: LANTUS Inject 25 Units into the skin at bedtime.   lidocaine-prilocaine cream Commonly known as: EMLA Apply to affected area once   losartan 25 MG tablet Commonly known as: COZAAR Take 1 tablet (25 mg total) by mouth daily. What changed: how much to take   metFORMIN 1000 MG tablet Commonly known as: GLUCOPHAGE Take 1,000 mg by mouth 2 (two) times daily with a meal.   metoprolol succinate 25 MG 24 hr tablet Commonly known as: TOPROL-XL TAKE 1 AND 1/2 TABLETS BY MOUTH EVERY DAY What changed: how much to take   nitroGLYCERIN 0.4 MG SL tablet Commonly known as: NITROSTAT Place 1 tablet (  0.4 mg total) under the tongue every 5 (five) minutes as needed for chest pain.   NovoLOG FlexPen 100 UNIT/ML FlexPen Generic drug: insulin aspart Inject 3 Units into the skin 3 (three) times daily with meals. What changed: how much to take   OLANZapine 10 MG tablet Commonly known as: ZYPREXA TAKE 1 TABLET BY MOUTH EVERYDAY AT BEDTIME   omeprazole 20 MG capsule Commonly known as: PRILOSEC Take 20 mg by mouth daily.   ondansetron 8 MG tablet Commonly known as: Zofran Take 1 tablet (8 mg total) by mouth 2 (two) times daily as needed for refractory nausea / vomiting. Start on day 3 after chemotherapy.   pravastatin 20 MG tablet Commonly known as: PRAVACHOL Take 20 mg by mouth daily. 03/09/2022 Patient states  is taking only Pravastatin.   prochlorperazine 10 MG tablet Commonly known as: COMPAZINE Take 1 tablet (10 mg total) by mouth every 6 (six) hours as needed (Nausea or vomiting).   QUEtiapine 400 MG tablet Commonly known as: SEROQUEL Take 800 mg by mouth at bedtime.        Allergies:  Allergies  Allergen Reactions   Lithium Other (See Comments)    Spinal fluid built up in brain   Trulicity [Dulaglutide] Nausea And Vomiting   Penicillins Other (See Comments)    UNKNOWN CHILDHOOD REACTION    Past Medical History, Surgical history, Social history, and Family History were reviewed and updated.  Review of Systems: Review of Systems  Constitutional:  Positive for malaise/fatigue.  HENT: Negative.    Eyes: Negative.   Respiratory: Negative.    Cardiovascular:  Positive for leg swelling.  Gastrointestinal:  Positive for nausea.  Genitourinary: Negative.   Musculoskeletal:  Positive for joint pain and myalgias.  Skin: Negative.   Neurological:  Positive for tingling.  Endo/Heme/Allergies: Negative.   Psychiatric/Behavioral: Negative.       Physical Exam:  height is '5\' 2"'$  (1.575 m) and weight is 189 lb (85.7 kg). Her oral temperature is 98 F (36.7 C). Her blood pressure is 153/65 (abnormal) and her pulse is 103 (abnormal). Her respiration is 18 and oxygen saturation is 100%.   Wt Readings from Last 3 Encounters:  06/10/22 189 lb (85.7 kg)  05/06/22 188 lb (85.3 kg)  04/06/22 194 lb (88 kg)    Physical Exam Vitals reviewed.  HENT:     Head: Normocephalic and atraumatic.  Eyes:     Pupils: Pupils are equal, round, and reactive to light.  Cardiovascular:     Rate and Rhythm: Normal rate and regular rhythm.     Heart sounds: Normal heart sounds.  Pulmonary:     Effort: Pulmonary effort is normal.     Breath sounds: Normal breath sounds.  Abdominal:     General: Bowel sounds are normal.     Palpations: Abdomen is soft.  Musculoskeletal:        General: No  tenderness or deformity. Normal range of motion.     Cervical back: Normal range of motion.  Lymphadenopathy:     Cervical: No cervical adenopathy.  Skin:    General: Skin is warm and dry.     Findings: No erythema or rash.  Neurological:     Mental Status: She is alert and oriented to person, place, and time.  Psychiatric:        Behavior: Behavior normal.        Thought Content: Thought content normal.        Judgment: Judgment normal.  Lab Results  Component Value Date   WBC 2.2 (L) 06/10/2022   HGB 8.5 (L) 06/10/2022   HCT 25.0 (L) 06/10/2022   MCV 104.2 (H) 06/10/2022   PLT 25 (L) 06/10/2022   Lab Results  Component Value Date   FERRITIN 38 05/06/2022   IRON 70 05/06/2022   TIBC 434 05/06/2022   UIBC 364 05/06/2022   IRONPCTSAT 16 05/06/2022   Lab Results  Component Value Date   RETICCTPCT 6.8 (H) 05/06/2022   RBC 2.40 (L) 06/10/2022   No results found for: "KPAFRELGTCHN", "LAMBDASER", "KAPLAMBRATIO" No results found for: "IGGSERUM", "IGA", "IGMSERUM" No results found for: "TOTALPROTELP", "ALBUMINELP", "A1GS", "A2GS", "BETS", "BETA2SER", "GAMS", "MSPIKE", "SPEI"   Chemistry      Component Value Date/Time   NA 136 06/10/2022 0939   K 4.1 06/10/2022 0939   CL 106 06/10/2022 0939   CO2 23 06/10/2022 0939   BUN 15 06/10/2022 0939   CREATININE 0.75 06/10/2022 0939      Component Value Date/Time   CALCIUM 9.2 06/10/2022 0939   ALKPHOS 56 06/10/2022 0939   AST 19 06/10/2022 0939   ALT 16 06/10/2022 0939   BILITOT 0.3 06/10/2022 0939       Impression and Plan: Ms. Ariana Herrera is a very pleasant is a very pleasant 56 yo caucasian female with recurrent ductal carcinoma of the left breast.   Again, regarding have to make a change in her protocol so  that we can try to get her on schedule with treatment.    We will try her on Trodelvy.  I think that Ariana Herrera would be reasonable.  Hopefully, this would not have any issues with respect to neuropathy.  At some  point, we may try to give her a "vacation" from treatment.  I wish that she would stop smoking.  This certainly is going to interfere to some degree with the effectiveness of chemotherapy.  We will go ahead and transfuse her tomorrow.  At some point, we may consider stopping chemotherapy.  May consider radiation therapy as a consolidative treatment for the left supraclavicular region.  I probably would repeat a PET scan on her in October.  We will have to follow along with the CA 27. 29 levels.  We will start treatment in 2 weeks.  I will plan to see her back for her second cycle of treatment in September.    Volanda Napoleon, MD 8/17/202311:12 AM

## 2022-06-10 NOTE — Patient Instructions (Signed)

## 2022-06-10 NOTE — Progress Notes (Signed)
No treatment due to labs.

## 2022-06-10 NOTE — Progress Notes (Signed)
DISCONTINUE ON PATHWAY REGIMEN - Breast     A cycle is every 21 days:     Pembrolizumab      Carboplatin      Gemcitabine   **Always confirm dose/schedule in your pharmacy ordering system**  REASON: Toxicities / Adverse Event PRIOR TREATMENT: [id number]: Pembrolizumab 200 mg D1 + Gemcitabine 1,000 mg/m2 D1, 8 + Carboplatin AUC=2 D1, 8 q21 Days TREATMENT RESPONSE: Complete Response (CR)  START ON PATHWAY REGIMEN - Breast     A cycle is every 21 days:     Sacituzumab govitecan-hziy   **Always confirm dose/schedule in your pharmacy ordering system**  Patient Characteristics: Distant Metastases or Locoregional Recurrent Disease - Unresected or Locally Advanced Unresectable Disease Progressing after Neoadjuvant and Local Therapies, ER Negative/Unknown, Chemotherapy, HER2 Negative/Unknown, Second Line Therapeutic Status: Distant Metastases HER2 Status: Negative (-) ER Status: Negative (-) PR Status: Negative (-) Therapy Approach Indicated: Standard Chemotherapy/Endocrine Therapy Line of Therapy: Second Line Intent of Therapy: Non-Curative / Palliative Intent, Discussed with Patient

## 2022-06-11 ENCOUNTER — Encounter: Payer: Self-pay | Admitting: Hematology & Oncology

## 2022-06-11 ENCOUNTER — Inpatient Hospital Stay: Payer: Medicaid Other

## 2022-06-11 DIAGNOSIS — Z5111 Encounter for antineoplastic chemotherapy: Secondary | ICD-10-CM | POA: Diagnosis not present

## 2022-06-11 DIAGNOSIS — C50011 Malignant neoplasm of nipple and areola, right female breast: Secondary | ICD-10-CM

## 2022-06-11 LAB — T4: T4, Total: 5 ug/dL (ref 4.5–12.0)

## 2022-06-11 MED ORDER — SODIUM CHLORIDE 0.9% IV SOLUTION
250.0000 mL | Freq: Once | INTRAVENOUS | Status: AC
  Administered 2022-06-11: 250 mL via INTRAVENOUS

## 2022-06-11 MED ORDER — SODIUM CHLORIDE 0.9% FLUSH
10.0000 mL | INTRAVENOUS | Status: AC | PRN
  Administered 2022-06-11: 10 mL

## 2022-06-11 MED ORDER — HEPARIN SOD (PORK) LOCK FLUSH 100 UNIT/ML IV SOLN
500.0000 [IU] | Freq: Every day | INTRAVENOUS | Status: AC | PRN
  Administered 2022-06-11: 500 [IU]

## 2022-06-11 NOTE — Patient Instructions (Signed)
Blood Transfusion, Adult, Care After The following information offers guidance on how to care for yourself after your procedure. Your health care provider may also give you more specific instructions. If you have problems or questions, contact your health care provider. What can I expect after the procedure? After the procedure, it is common to have: Bruising and soreness where the IV was inserted. A headache. Follow these instructions at home: IV insertion site care     Follow instructions from your health care provider about how to take care of your IV insertion site. Make sure you: Wash your hands with soap and water for at least 20 seconds before and after you change your bandage (dressing). If soap and water are not available, use hand sanitizer. Change your dressing as told by your health care provider. Check your IV insertion site every day for signs of infection. Check for: Redness, swelling, or pain. Bleeding from the site. Warmth. Pus or a bad smell. General instructions Take over-the-counter and prescription medicines only as told by your health care provider. Rest as told by your health care provider. Return to your normal activities as told by your health care provider. Keep all follow-up visits. Lab tests may need to be done at certain periods to recheck your blood counts. Contact a health care provider if: You have itching or red, swollen areas of skin (hives). You have a fever or chills. You have pain in the head, back, or chest. You feel anxious or you feel weak after doing your normal activities. You have redness, swelling, warmth, or pain around the IV insertion site. You have blood coming from the IV insertion site that does not stop with pressure. You have pus or a bad smell coming from your IV insertion site. If you received your blood transfusion in an outpatient setting, you will be told whom to contact to report any reactions. Get help right away if: You  have symptoms of a serious allergic or immune system reaction, including: Trouble breathing or shortness of breath. Swelling of the face, feeling flushed, or widespread rash. Dark urine or blood in the urine. Fast heartbeat. These symptoms may be an emergency. Get help right away. Call 911. Do not wait to see if the symptoms will go away. Do not drive yourself to the hospital. Summary Bruising and soreness around the IV insertion site are common. Check your IV insertion site every day for signs of infection. Rest as told by your health care provider. Return to your normal activities as told by your health care provider. Get help right away for symptoms of a serious allergic or immune system reaction to the blood transfusion. This information is not intended to replace advice given to you by your health care provider. Make sure you discuss any questions you have with your health care provider. Document Revised: 01/08/2022 Document Reviewed: 01/08/2022 Elsevier Patient Education  2023 Elsevier Inc.  

## 2022-06-11 NOTE — Progress Notes (Signed)
Patient port flushed and de accessed post blood transfusion. Scant blood return noted. Explained to patient the need for TPA. Patient states she doesn't want TPA today she will have that done next time. Patient discharged ambulatory without complaints or concerns.

## 2022-06-12 LAB — BPAM RBC
Blood Product Expiration Date: 202309062359
ISSUE DATE / TIME: 202308180733
Unit Type and Rh: 6200

## 2022-06-12 LAB — TYPE AND SCREEN
ABO/RH(D): A POS
Antibody Screen: NEGATIVE
Unit division: 0

## 2022-06-14 ENCOUNTER — Other Ambulatory Visit: Payer: Medicaid Other

## 2022-06-14 ENCOUNTER — Other Ambulatory Visit: Payer: Self-pay | Admitting: *Deleted

## 2022-06-14 ENCOUNTER — Telehealth: Payer: Self-pay

## 2022-06-14 ENCOUNTER — Encounter: Payer: Self-pay | Admitting: Hematology & Oncology

## 2022-06-14 ENCOUNTER — Telehealth: Payer: Self-pay | Admitting: *Deleted

## 2022-06-14 DIAGNOSIS — N390 Urinary tract infection, site not specified: Secondary | ICD-10-CM

## 2022-06-14 DIAGNOSIS — C50011 Malignant neoplasm of nipple and areola, right female breast: Secondary | ICD-10-CM

## 2022-06-14 DIAGNOSIS — C50212 Malignant neoplasm of upper-inner quadrant of left female breast: Secondary | ICD-10-CM

## 2022-06-14 MED ORDER — PREDNISONE 20 MG PO TABS: ORAL_TABLET | ORAL | 0 refills | Status: DC

## 2022-06-14 NOTE — Progress Notes (Signed)
Pharmacist Chemotherapy Monitoring - Initial Assessment    Anticipated start date: 06/24/22   The following has been reviewed per standard work regarding the patient's treatment regimen: The patient's diagnosis, treatment plan and drug doses, and organ/hematologic function Lab orders and baseline tests specific to treatment regimen  The treatment plan start date, drug sequencing, and pre-medications Prior authorization status  Patient's documented medication list, including drug-drug interaction screen and prescriptions for anti-emetics and supportive care specific to the treatment regimen The drug concentrations, fluid compatibility, administration routes, and timing of the medications to be used The patient's access for treatment and lifetime cumulative dose history, if applicable  The patient's medication allergies and previous infusion related reactions, if applicable   Changes made to treatment plan:  treatment plan date  Follow up needed:  N/A   Yui Mulvaney, Jacqlyn Larsen, [place], 06/14/2022  8:21 AM

## 2022-06-14 NOTE — Telephone Encounter (Signed)
Call received from patient and patient states her rash and itching have continued since Friday at approx 7 pm, despite taking Hydroxyzine, Benadryl cream and Medol dose pack given to her at urgent care.  Requested that pt send pictures of rash via MyChart.  Pt states she will do so.

## 2022-06-14 NOTE — Telephone Encounter (Signed)
Pictures on MyChart reviewed by Dr. Marin Olp.  Call placed to patient and patient notified per order of Dr. Marin Olp to come in tomorrow for lab work, to stop Medrol dose pack and to take Prednisone 40 mg daily times five days and to take PO Benadryl per directions on package.  Teach back done. Pt is appreciative of assistance and states that she can be here tomorrow at 0930.  Message sent to scheduling.

## 2022-06-14 NOTE — Telephone Encounter (Signed)
According to the Telephone Advice Record, patient called on 06/12/22 at 13;14 pm to report that she had a blood transfusion on 06/11/22 and "today and last night she had a rash all over her body. Broke out from head to toe.Went to Urgent Care, they gave her prednisone and and hydroxizine." Called patient to follow up on the rash that she developed post blood transfusion. Left a message for her to call us back at her earliest convenience.

## 2022-06-15 ENCOUNTER — Inpatient Hospital Stay: Payer: Medicaid Other

## 2022-06-15 DIAGNOSIS — Z5111 Encounter for antineoplastic chemotherapy: Secondary | ICD-10-CM | POA: Diagnosis not present

## 2022-06-15 DIAGNOSIS — C50011 Malignant neoplasm of nipple and areola, right female breast: Secondary | ICD-10-CM

## 2022-06-15 DIAGNOSIS — C50212 Malignant neoplasm of upper-inner quadrant of left female breast: Secondary | ICD-10-CM

## 2022-06-15 DIAGNOSIS — N390 Urinary tract infection, site not specified: Secondary | ICD-10-CM

## 2022-06-15 LAB — CMP (CANCER CENTER ONLY)
ALT: 15 U/L (ref 0–44)
AST: 15 U/L (ref 15–41)
Albumin: 4.5 g/dL (ref 3.5–5.0)
Alkaline Phosphatase: 67 U/L (ref 38–126)
Anion gap: 9 (ref 5–15)
BUN: 16 mg/dL (ref 6–20)
CO2: 26 mmol/L (ref 22–32)
Calcium: 9.9 mg/dL (ref 8.9–10.3)
Chloride: 100 mmol/L (ref 98–111)
Creatinine: 0.82 mg/dL (ref 0.44–1.00)
GFR, Estimated: >60 mL/min (ref >60)
Glucose, Bld: 281 mg/dL — ABNORMAL HIGH (ref 70–99)
Potassium: 4.4 mmol/L (ref 3.5–5.1)
Sodium: 135 mmol/L (ref 135–145)
Total Bilirubin: 0.5 mg/dL (ref 0.3–1.2)
Total Protein: 7.8 g/dL (ref 6.5–8.1)

## 2022-06-15 LAB — CBC WITH DIFFERENTIAL (CANCER CENTER ONLY)
Abs Immature Granulocytes: 0.13 10*3/uL — ABNORMAL HIGH (ref 0.00–0.07)
Basophils Absolute: 0 10*3/uL (ref 0.0–0.1)
Basophils Relative: 0 %
Eosinophils Absolute: 0 10*3/uL (ref 0.0–0.5)
Eosinophils Relative: 0 %
HCT: 33 % — ABNORMAL LOW (ref 36.0–46.0)
Hemoglobin: 11.4 g/dL — ABNORMAL LOW (ref 12.0–15.0)
Immature Granulocytes: 2 %
Lymphocytes Relative: 18 %
Lymphs Abs: 1.1 10*3/uL (ref 0.7–4.0)
MCH: 34.4 pg — ABNORMAL HIGH (ref 26.0–34.0)
MCHC: 34.5 g/dL (ref 30.0–36.0)
MCV: 99.7 fL (ref 80.0–100.0)
Monocytes Absolute: 0.7 10*3/uL (ref 0.1–1.0)
Monocytes Relative: 11 %
Neutro Abs: 4.2 10*3/uL (ref 1.7–7.7)
Neutrophils Relative %: 69 %
Platelet Count: 44 10*3/uL — ABNORMAL LOW (ref 150–400)
RBC: 3.31 MIL/uL — ABNORMAL LOW (ref 3.87–5.11)
RDW: 16.7 % — ABNORMAL HIGH (ref 11.5–15.5)
Smear Review: NORMAL
WBC Count: 6.2 10*3/uL (ref 4.0–10.5)
nRBC: 0.8 % — ABNORMAL HIGH (ref 0.0–0.2)

## 2022-06-15 LAB — LACTATE DEHYDROGENASE: LDH: 271 U/L — ABNORMAL HIGH (ref 98–192)

## 2022-06-22 ENCOUNTER — Other Ambulatory Visit: Payer: Self-pay | Admitting: Hematology & Oncology

## 2022-06-22 ENCOUNTER — Other Ambulatory Visit: Payer: Self-pay

## 2022-06-22 ENCOUNTER — Other Ambulatory Visit (HOSPITAL_BASED_OUTPATIENT_CLINIC_OR_DEPARTMENT_OTHER): Payer: Self-pay

## 2022-06-22 DIAGNOSIS — M25512 Pain in left shoulder: Secondary | ICD-10-CM

## 2022-06-22 DIAGNOSIS — C50011 Malignant neoplasm of nipple and areola, right female breast: Secondary | ICD-10-CM

## 2022-06-22 MED ORDER — HYDROCODONE-ACETAMINOPHEN 7.5-325 MG PO TABS
1.0000 | ORAL_TABLET | ORAL | 0 refills | Status: DC | PRN
  Filled 2022-06-22 – 2022-06-24 (×2): qty 90, 15d supply, fill #0

## 2022-06-24 ENCOUNTER — Inpatient Hospital Stay: Payer: Medicaid Other

## 2022-06-24 ENCOUNTER — Other Ambulatory Visit (HOSPITAL_BASED_OUTPATIENT_CLINIC_OR_DEPARTMENT_OTHER): Payer: Self-pay

## 2022-06-24 VITALS — BP 147/55 | HR 100 | Temp 98.2°F | Resp 20

## 2022-06-24 DIAGNOSIS — C50011 Malignant neoplasm of nipple and areola, right female breast: Secondary | ICD-10-CM

## 2022-06-24 DIAGNOSIS — Z5111 Encounter for antineoplastic chemotherapy: Secondary | ICD-10-CM | POA: Diagnosis not present

## 2022-06-24 LAB — CMP (CANCER CENTER ONLY)
ALT: 13 U/L (ref 0–44)
AST: 11 U/L — ABNORMAL LOW (ref 15–41)
Albumin: 3.9 g/dL (ref 3.5–5.0)
Alkaline Phosphatase: 64 U/L (ref 38–126)
Anion gap: 8 (ref 5–15)
BUN: 16 mg/dL (ref 6–20)
CO2: 23 mmol/L (ref 22–32)
Calcium: 8.6 mg/dL — ABNORMAL LOW (ref 8.9–10.3)
Chloride: 104 mmol/L (ref 98–111)
Creatinine: 0.89 mg/dL (ref 0.44–1.00)
GFR, Estimated: >60 mL/min (ref >60)
Glucose, Bld: 170 mg/dL — ABNORMAL HIGH (ref 70–99)
Potassium: 3.8 mmol/L (ref 3.5–5.1)
Sodium: 135 mmol/L (ref 135–145)
Total Bilirubin: 0.3 mg/dL (ref 0.3–1.2)
Total Protein: 6.5 g/dL (ref 6.5–8.1)

## 2022-06-24 LAB — CBC WITH DIFFERENTIAL (CANCER CENTER ONLY)
Abs Immature Granulocytes: 0.08 10*3/uL — ABNORMAL HIGH (ref 0.00–0.07)
Basophils Absolute: 0 10*3/uL (ref 0.0–0.1)
Basophils Relative: 1 %
Eosinophils Absolute: 0 10*3/uL (ref 0.0–0.5)
Eosinophils Relative: 0 %
HCT: 32.2 % — ABNORMAL LOW (ref 36.0–46.0)
Hemoglobin: 10.7 g/dL — ABNORMAL LOW (ref 12.0–15.0)
Immature Granulocytes: 4 %
Lymphocytes Relative: 37 %
Lymphs Abs: 0.8 10*3/uL (ref 0.7–4.0)
MCH: 34.6 pg — ABNORMAL HIGH (ref 26.0–34.0)
MCHC: 33.2 g/dL (ref 30.0–36.0)
MCV: 104.2 fL — ABNORMAL HIGH (ref 80.0–100.0)
Monocytes Absolute: 0.5 10*3/uL (ref 0.1–1.0)
Monocytes Relative: 23 %
Neutro Abs: 0.7 10*3/uL — ABNORMAL LOW (ref 1.7–7.7)
Neutrophils Relative %: 35 %
Platelet Count: 79 10*3/uL — ABNORMAL LOW (ref 150–400)
RBC: 3.09 MIL/uL — ABNORMAL LOW (ref 3.87–5.11)
RDW: 17.2 % — ABNORMAL HIGH (ref 11.5–15.5)
Smear Review: NORMAL
WBC Count: 2.1 10*3/uL — ABNORMAL LOW (ref 4.0–10.5)
nRBC: 0 % (ref 0.0–0.2)

## 2022-06-24 LAB — PHOSPHORUS: Phosphorus: 3.6 mg/dL (ref 2.5–4.6)

## 2022-06-24 LAB — MAGNESIUM: Magnesium: 1.4 mg/dL — ABNORMAL LOW (ref 1.7–2.4)

## 2022-06-24 MED ORDER — ONDANSETRON HCL 8 MG PO TABS: 8.0000 mg | ORAL_TABLET | Freq: Three times a day (TID) | ORAL | 1 refills | Status: DC | PRN

## 2022-06-24 MED ORDER — DEXAMETHASONE 4 MG PO TABS: ORAL_TABLET | ORAL | 1 refills | Status: DC

## 2022-06-24 MED ORDER — HEPARIN SOD (PORK) LOCK FLUSH 100 UNIT/ML IV SOLN
500.0000 [IU] | Freq: Once | INTRAVENOUS | Status: AC
  Administered 2022-06-24: 500 [IU] via INTRAVENOUS

## 2022-06-24 MED ORDER — SODIUM CHLORIDE 0.9% FLUSH
10.0000 mL | Freq: Once | INTRAVENOUS | Status: AC
  Administered 2022-06-24: 10 mL via INTRAVENOUS

## 2022-06-24 MED ORDER — LIDOCAINE-PRILOCAINE 2.5-2.5 % EX CREA: TOPICAL_CREAM | CUTANEOUS | 3 refills | Status: DC

## 2022-06-24 MED ORDER — PROCHLORPERAZINE MALEATE 10 MG PO TABS: 10.0000 mg | ORAL_TABLET | Freq: Four times a day (QID) | ORAL | 1 refills | Status: DC | PRN

## 2022-06-24 MED ORDER — LOPERAMIDE HCL 2 MG PO CAPS: 4.0000 mg | ORAL_CAPSULE | ORAL | 4 refills | Status: DC | PRN

## 2022-06-24 NOTE — Patient Instructions (Addendum)
Neutropenia °Neutropenia is a condition that occurs when you have low levels of neutrophils. Neutrophils are a type of white blood cells. They are made in the spongy center of bones (bone marrow). They fight infections. °Neutrophils are your body's main defense against infections. The fewer neutrophils you have and the longer your body remains without them, the greater your risk of getting a severe infection. °What are the causes? °This condition can occur if your body uses up or destroys neutrophils faster than your bone marrow can make them. Neutropenia may be caused by: °A bacterial or fungal infection. °Allergic disorders. °Reactions to some medicines. °An autoimmune disease. °An enlarged spleen. °This condition can also occur if your bone marrow does not produce enough neutrophils. This problem may be caused by: °Cancer. °Cancer treatments, such as radiation or chemotherapy. °Viral infections. °Medicines, such as phenytoin. °Vitamin B12 deficiency. °Diseases of the bone marrow. °Environmental toxins, such as insecticides. °What are the signs or symptoms? °This condition does not usually cause symptoms. If symptoms are present, they are usually caused by an underlying infection. Symptoms of an infection may include: °Fever. °Chills. °Swollen glands. °Mouth ulcers. °Cough. °Rash or skin infection. Skin may be red, swollen, or painful. °Abdominal or rectal pain. °Frequent urination or pain or burning with urination. °Because neutropenia weakens the immune system, symptoms of infection may be reduced. It is important to be aware of any changes in your body and talk to your health care provider. °How is this diagnosed? °This condition is diagnosed based on your medical history and a physical exam. Tests will also be done, such as: °A complete blood count (CBC). °Bone marrow biopsy. This is collecting a sample of bone marrow for testing. °A chest X-ray. °A urine culture. °A blood culture. °How is this  treated? °Treatment depends on the underlying cause and severity of your condition. Mild neutropenia may not require treatment. Treatment may include medicines, such as: °Antibiotic medicine given through an IV. °Antiviral medicines. °Antifungal medicines. °A medicine to increase production of neutrophils (colony-stimulating factor). You may get this medicine through an IV or by injection. °Steroids given through an IV. °If an underlying condition is causing neutropenia, you may need treatment for that condition. If medicines or cancer treatments are causing neutropenia, your health care provider may have you stop the medicines or treatment. °Follow these instructions at home: °Medicines ° °Take over-the-counter and prescription medicines only as told by your health care provider. °Get an annual flu shot. Ask your health care provider whether you or anyone you live with needs any other vaccines. °Eating and drinking °Do not share food utensils. °Do not eat unpasteurized foods. °Do not eat raw or undercooked meat, eggs, or seafood. °Do not eat unwashed, raw fruits or vegetables. °Lifestyle °Avoid exposure to groups of people or children. °Avoid being around people who are sick. °Avoid being around live plants or fresh flowers. °Avoid being around dirt or dust, such as in construction areas or gardens. Wear gloves if you are going to do yard work or gardening. °Do not provide direct care for pets. Avoid animal droppings. Do not clean litter boxes and bird cages. °Do not have sex unless your health care provider has approved. °Hygiene ° °Bathe daily. °Clean the area between the genitals and the anus (perineal area) after you urinate or have a bowel movement. If you are female, wipe from front to back. °Get regular dental care and brush your teeth with a soft toothbrush before and after meals. °Do not use   a regular razor. Use an electric razor to remove hair. Wash your hands often with soap and water for at least 20  seconds. Make sure others who come in contact with you also wash their hands. If soap and water are not available, use hand sanitizer. General instructions Take steps to reduce your risk of injury or infection. Follow any precautions as told by your health care provider. Take actions to avoid cuts and burns. For example: Be cautious when you use knives. Always cut away from yourself. Keep knives in protective sheaths or guards when not in use. Use oven mitts when you cook with a hot stove, oven, or grill. Stand a safe distance away from open fires. Do not use tampons, enemas, or rectal suppositories unless your health care provider has approved. Keep all follow-up visits. This is important. Contact a health care provider if: You have a cough. You have a sore throat. You develop sores in your mouth or anus. You have a warm, red, or tender area on your skin. You have red streaks on the skin. You develop a rash. You have swollen lymph nodes. You have frequent or painful urination. You have vaginal discharge or itching. Get help right away if: You have a fever. You have chills or shaking. You have nausea or vomiting. You have a lot of fatigue. You have shortness of breath. Summary Neutropenia is a condition that occurs when you have a lower-than-normal level of a type of white blood cell (neutrophils) in your body. This condition can occur if your body uses up or destroys neutrophils faster than your bone marrow can make them. Treatment depends on the underlying cause and severity of your condition. Mild neutropenia may not require treatment. Follow any precautions as told by your health care provider to reduce your risk for injury or infection. This information is not intended to replace advice given to you by your health care provider. Make sure you discuss any questions you have with your health care provider. Document Revised: 04/08/2021 Document Reviewed: 04/08/2021 Elsevier Patient  Education  [place].

## 2022-06-24 NOTE — Patient Instructions (Signed)

## 2022-06-24 NOTE — Progress Notes (Signed)
Reviewed pt labs with Dr. Marin Olp and plan to hold treatment today and attempt treatment in one week. Plan verbalized to patient who had understanding and agreement to plan. Reviewed neutropenic precautions with patient including good hand washing and fever guidelines. Pt verbalized understanding and had no further questions.

## 2022-06-30 ENCOUNTER — Other Ambulatory Visit: Payer: Self-pay

## 2022-06-30 DIAGNOSIS — C50011 Malignant neoplasm of nipple and areola, right female breast: Secondary | ICD-10-CM

## 2022-07-01 ENCOUNTER — Inpatient Hospital Stay: Payer: Medicaid Other

## 2022-07-01 ENCOUNTER — Inpatient Hospital Stay: Payer: Medicaid Other | Attending: Hematology & Oncology

## 2022-07-01 VITALS — BP 141/69 | HR 88

## 2022-07-01 DIAGNOSIS — K5900 Constipation, unspecified: Secondary | ICD-10-CM | POA: Diagnosis not present

## 2022-07-01 DIAGNOSIS — Z171 Estrogen receptor negative status [ER-]: Secondary | ICD-10-CM | POA: Insufficient documentation

## 2022-07-01 DIAGNOSIS — F17210 Nicotine dependence, cigarettes, uncomplicated: Secondary | ICD-10-CM | POA: Insufficient documentation

## 2022-07-01 DIAGNOSIS — Z452 Encounter for adjustment and management of vascular access device: Secondary | ICD-10-CM | POA: Insufficient documentation

## 2022-07-01 DIAGNOSIS — Z5112 Encounter for antineoplastic immunotherapy: Secondary | ICD-10-CM | POA: Diagnosis present

## 2022-07-01 DIAGNOSIS — J40 Bronchitis, not specified as acute or chronic: Secondary | ICD-10-CM | POA: Diagnosis not present

## 2022-07-01 DIAGNOSIS — C50011 Malignant neoplasm of nipple and areola, right female breast: Secondary | ICD-10-CM

## 2022-07-01 DIAGNOSIS — C50912 Malignant neoplasm of unspecified site of left female breast: Secondary | ICD-10-CM | POA: Insufficient documentation

## 2022-07-01 LAB — CBC WITH DIFFERENTIAL (CANCER CENTER ONLY)
Abs Immature Granulocytes: 0.01 10*3/uL (ref 0.00–0.07)
Basophils Absolute: 0 10*3/uL (ref 0.0–0.1)
Basophils Relative: 1 %
Eosinophils Absolute: 0 10*3/uL (ref 0.0–0.5)
Eosinophils Relative: 0 %
HCT: 34.1 % — ABNORMAL LOW (ref 36.0–46.0)
Hemoglobin: 11.6 g/dL — ABNORMAL LOW (ref 12.0–15.0)
Immature Granulocytes: 0 %
Lymphocytes Relative: 33 %
Lymphs Abs: 1.3 10*3/uL (ref 0.7–4.0)
MCH: 35 pg — ABNORMAL HIGH (ref 26.0–34.0)
MCHC: 34 g/dL (ref 30.0–36.0)
MCV: 103 fL — ABNORMAL HIGH (ref 80.0–100.0)
Monocytes Absolute: 0.6 10*3/uL (ref 0.1–1.0)
Monocytes Relative: 14 %
Neutro Abs: 2.1 10*3/uL (ref 1.7–7.7)
Neutrophils Relative %: 52 %
Platelet Count: 118 10*3/uL — ABNORMAL LOW (ref 150–400)
RBC: 3.31 MIL/uL — ABNORMAL LOW (ref 3.87–5.11)
RDW: 17.3 % — ABNORMAL HIGH (ref 11.5–15.5)
Smear Review: NORMAL
WBC Count: 4 10*3/uL (ref 4.0–10.5)
nRBC: 0 % (ref 0.0–0.2)

## 2022-07-01 LAB — CMP (CANCER CENTER ONLY)
ALT: 9 U/L (ref 0–44)
AST: 13 U/L — ABNORMAL LOW (ref 15–41)
Albumin: 3.9 g/dL (ref 3.5–5.0)
Alkaline Phosphatase: 71 U/L (ref 38–126)
Anion gap: 8 (ref 5–15)
BUN: 12 mg/dL (ref 6–20)
CO2: 27 mmol/L (ref 22–32)
Calcium: 9.6 mg/dL (ref 8.9–10.3)
Chloride: 105 mmol/L (ref 98–111)
Creatinine: 0.74 mg/dL (ref 0.44–1.00)
GFR, Estimated: >60 mL/min (ref >60)
Glucose, Bld: 91 mg/dL (ref 70–99)
Potassium: 3.8 mmol/L (ref 3.5–5.1)
Sodium: 140 mmol/L (ref 135–145)
Total Bilirubin: 0.5 mg/dL (ref 0.3–1.2)
Total Protein: 6.6 g/dL (ref 6.5–8.1)

## 2022-07-01 MED ORDER — ATROPINE SULFATE 1 MG/ML IV SOLN: 0.5000 mg | Freq: Once | INTRAVENOUS | Status: DC | PRN

## 2022-07-01 MED ORDER — SODIUM CHLORIDE 0.9 % IV SOLN
150.0000 mg | Freq: Once | INTRAVENOUS | Status: AC
  Administered 2022-07-01: 150 mg via INTRAVENOUS
  Filled 2022-07-01: qty 150

## 2022-07-01 MED ORDER — SODIUM CHLORIDE 0.9 % IV SOLN
8.0000 mg/kg | Freq: Once | INTRAVENOUS | Status: AC
  Administered 2022-07-01: 720 mg via INTRAVENOUS
  Filled 2022-07-01: qty 72

## 2022-07-01 MED ORDER — HEPARIN SOD (PORK) LOCK FLUSH 100 UNIT/ML IV SOLN
500.0000 [IU] | Freq: Once | INTRAVENOUS | Status: AC | PRN
  Administered 2022-07-01: 500 [IU]

## 2022-07-01 MED ORDER — PALONOSETRON HCL INJECTION 0.25 MG/5ML
0.2500 mg | Freq: Once | INTRAVENOUS | Status: AC
  Administered 2022-07-01: 0.25 mg via INTRAVENOUS
  Filled 2022-07-01: qty 5

## 2022-07-01 MED ORDER — FAMOTIDINE IN NACL 20-0.9 MG/50ML-% IV SOLN
20.0000 mg | Freq: Once | INTRAVENOUS | Status: AC
  Administered 2022-07-01: 20 mg via INTRAVENOUS
  Filled 2022-07-01: qty 50

## 2022-07-01 MED ORDER — DIPHENHYDRAMINE HCL 50 MG/ML IJ SOLN
50.0000 mg | Freq: Once | INTRAMUSCULAR | Status: AC
  Administered 2022-07-01: 50 mg via INTRAVENOUS
  Filled 2022-07-01: qty 1

## 2022-07-01 MED ORDER — SODIUM CHLORIDE 0.9% FLUSH
10.0000 mL | INTRAVENOUS | Status: DC | PRN
  Administered 2022-07-01: 10 mL

## 2022-07-01 MED ORDER — SODIUM CHLORIDE 0.9 % IV SOLN: Freq: Once | INTRAVENOUS | Status: AC

## 2022-07-01 MED ORDER — SODIUM CHLORIDE 0.9 % IV SOLN
10.0000 mg | Freq: Once | INTRAVENOUS | Status: AC
  Administered 2022-07-01: 10 mg via INTRAVENOUS
  Filled 2022-07-01: qty 10

## 2022-07-01 MED ORDER — ACETAMINOPHEN 325 MG PO TABS
650.0000 mg | ORAL_TABLET | Freq: Once | ORAL | Status: AC
  Administered 2022-07-01: 650 mg via ORAL
  Filled 2022-07-01: qty 2

## 2022-07-01 NOTE — Patient Instructions (Signed)
[facility]  Discharge Instructions: Thank you for choosing [facility] to provide your oncology and hematology care.   If you have a lab appointment with the [facility], please go directly to the [facility] and check in at the registration area.  Wear comfortable clothing and clothing appropriate for easy access to any Portacath or PICC line.   We strive to give you quality time with your provider. You may need to reschedule your appointment if you arrive late (15 or more minutes).  Arriving late affects you and other patients whose appointments are after yours.  Also, if you miss three or more appointments without notifying the office, you may be dismissed from the clinic at the provider's discretion.      For prescription refill requests, have your pharmacy contact our office and allow 72 hours for refills to be completed.    Today you received the following chemotherapy and/or immunotherapy agents trodelvy      To help prevent nausea and vomiting after your treatment, we encourage you to take your nausea medication as directed.  BELOW ARE SYMPTOMS THAT SHOULD BE REPORTED IMMEDIATELY: *FEVER GREATER THAN 100.4 F (38 C) OR HIGHER *CHILLS OR SWEATING *NAUSEA AND VOMITING THAT IS NOT CONTROLLED WITH YOUR NAUSEA MEDICATION *UNUSUAL SHORTNESS OF BREATH *UNUSUAL BRUISING OR BLEEDING *URINARY PROBLEMS (pain or burning when urinating, or frequent urination) *BOWEL PROBLEMS (unusual diarrhea, constipation, pain near the anus) TENDERNESS IN MOUTH AND THROAT WITH OR WITHOUT PRESENCE OF ULCERS (sore throat, sores in mouth, or a toothache) UNUSUAL RASH, SWELLING OR PAIN  UNUSUAL VAGINAL DISCHARGE OR ITCHING   Items with * indicate a potential emergency and should be followed up as soon as possible or go to the Emergency Department if any problems should occur.  Please show the CHEMOTHERAPY ALERT CARD or IMMUNOTHERAPY ALERT CARD at check-in to the  Emergency Department and triage nurse. Should you have questions after your visit or need to cancel or reschedule your appointment, please contact [facility]  [phone] and follow the prompts.  Office hours are 8:00 a.m. to 4:30 p.m. Monday - Friday. Please note that voicemails left after 4:00 p.m. may not be returned until the following business day.  We are closed weekends and major holidays. You have access to a nurse at all times for urgent questions. Please call the main number to the clinic [phone] and follow the prompts.  For any non-urgent questions, you may also contact your provider using MyChart. We now offer e-Visits for anyone 55 and older to request care online for non-urgent symptoms. For details visit [email or web address].   Also download the MyChart app! Go to the app store, search "MyChart", open the app, select [facility], and log in with your MyChart username and password.  Masks are optional in the cancer centers. If you would like for your care team to wear a mask while they are taking care of you, please let them know. You may have one support person who is at least 54 years old accompany you for your appointments.

## 2022-07-01 NOTE — Patient Instructions (Signed)

## 2022-07-06 ENCOUNTER — Encounter: Payer: Self-pay | Admitting: Hematology & Oncology

## 2022-07-07 ENCOUNTER — Other Ambulatory Visit: Payer: Self-pay | Admitting: Hematology & Oncology

## 2022-07-07 DIAGNOSIS — C50011 Malignant neoplasm of nipple and areola, right female breast: Secondary | ICD-10-CM

## 2022-07-07 DIAGNOSIS — M25512 Pain in left shoulder: Secondary | ICD-10-CM

## 2022-07-07 MED ORDER — HYDROCODONE-ACETAMINOPHEN 7.5-325 MG PO TABS
1.0000 | ORAL_TABLET | ORAL | 0 refills | Status: DC | PRN
  Filled 2022-07-07: qty 90, 15d supply, fill #0

## 2022-07-08 ENCOUNTER — Other Ambulatory Visit (HOSPITAL_BASED_OUTPATIENT_CLINIC_OR_DEPARTMENT_OTHER): Payer: Self-pay

## 2022-07-15 ENCOUNTER — Inpatient Hospital Stay: Payer: Medicaid Other

## 2022-07-15 ENCOUNTER — Encounter: Payer: Self-pay | Admitting: *Deleted

## 2022-07-15 ENCOUNTER — Inpatient Hospital Stay (HOSPITAL_BASED_OUTPATIENT_CLINIC_OR_DEPARTMENT_OTHER): Payer: Medicaid Other | Admitting: Hematology & Oncology

## 2022-07-15 ENCOUNTER — Other Ambulatory Visit: Payer: Self-pay

## 2022-07-15 ENCOUNTER — Encounter: Payer: Self-pay | Admitting: Hematology & Oncology

## 2022-07-15 VITALS — BP 140/74 | HR 91 | Resp 17

## 2022-07-15 VITALS — BP 143/79 | HR 102 | Temp 98.1°F | Resp 19 | Ht 62.0 in | Wt 189.0 lb

## 2022-07-15 DIAGNOSIS — C50011 Malignant neoplasm of nipple and areola, right female breast: Secondary | ICD-10-CM | POA: Diagnosis not present

## 2022-07-15 DIAGNOSIS — Z5112 Encounter for antineoplastic immunotherapy: Secondary | ICD-10-CM | POA: Diagnosis not present

## 2022-07-15 LAB — CMP (CANCER CENTER ONLY)
ALT: 13 U/L (ref 0–44)
AST: 11 U/L — ABNORMAL LOW (ref 15–41)
Albumin: 4 g/dL (ref 3.5–5.0)
Alkaline Phosphatase: 73 U/L (ref 38–126)
Anion gap: 8 (ref 5–15)
BUN: 9 mg/dL (ref 6–20)
CO2: 26 mmol/L (ref 22–32)
Calcium: 9.4 mg/dL (ref 8.9–10.3)
Chloride: 104 mmol/L (ref 98–111)
Creatinine: 0.75 mg/dL (ref 0.44–1.00)
GFR, Estimated: >60 mL/min (ref >60)
Glucose, Bld: 140 mg/dL — ABNORMAL HIGH (ref 70–99)
Potassium: 4.6 mmol/L (ref 3.5–5.1)
Sodium: 138 mmol/L (ref 135–145)
Total Bilirubin: 0.4 mg/dL (ref 0.3–1.2)
Total Protein: 6.8 g/dL (ref 6.5–8.1)

## 2022-07-15 LAB — CBC WITH DIFFERENTIAL (CANCER CENTER ONLY)
Abs Immature Granulocytes: 0 10*3/uL (ref 0.00–0.07)
Basophils Absolute: 0 10*3/uL (ref 0.0–0.1)
Basophils Relative: 1 %
Eosinophils Absolute: 0 10*3/uL (ref 0.0–0.5)
Eosinophils Relative: 0 %
HCT: 34.1 % — ABNORMAL LOW (ref 36.0–46.0)
Hemoglobin: 11.6 g/dL — ABNORMAL LOW (ref 12.0–15.0)
Lymphocytes Relative: 24 %
Lymphs Abs: 1 10*3/uL (ref 0.7–4.0)
MCH: 35 pg — ABNORMAL HIGH (ref 26.0–34.0)
MCHC: 34 g/dL (ref 30.0–36.0)
MCV: 103 fL — ABNORMAL HIGH (ref 80.0–100.0)
Monocytes Absolute: 0.5 10*3/uL (ref 0.1–1.0)
Monocytes Relative: 13 %
Neutro Abs: 2.5 10*3/uL (ref 1.7–7.7)
Neutrophils Relative %: 62 %
Platelet Count: 151 10*3/uL (ref 150–400)
RBC: 3.31 MIL/uL — ABNORMAL LOW (ref 3.87–5.11)
RDW: 16.9 % — ABNORMAL HIGH (ref 11.5–15.5)
Smear Review: NORMAL
WBC Count: 4.1 10*3/uL (ref 4.0–10.5)
nRBC: 0 % (ref 0.0–0.2)

## 2022-07-15 LAB — LACTATE DEHYDROGENASE: LDH: 181 U/L (ref 98–192)

## 2022-07-15 MED ORDER — SODIUM CHLORIDE 0.9% FLUSH
10.0000 mL | INTRAVENOUS | Status: DC | PRN
  Administered 2022-07-15: 10 mL

## 2022-07-15 MED ORDER — SODIUM CHLORIDE 0.9 % IV SOLN
10.0000 mg | Freq: Once | INTRAVENOUS | Status: AC
  Administered 2022-07-15: 10 mg via INTRAVENOUS
  Filled 2022-07-15: qty 10

## 2022-07-15 MED ORDER — SODIUM CHLORIDE 0.9 % IV SOLN: Freq: Once | INTRAVENOUS | Status: AC

## 2022-07-15 MED ORDER — HEPARIN SOD (PORK) LOCK FLUSH 100 UNIT/ML IV SOLN
500.0000 [IU] | Freq: Once | INTRAVENOUS | Status: AC | PRN
  Administered 2022-07-15: 500 [IU]

## 2022-07-15 MED ORDER — ALTEPLASE 2 MG IJ SOLR
2.0000 mg | Freq: Once | INTRAMUSCULAR | Status: AC | PRN
  Administered 2022-07-15: 2 mg
  Filled 2022-07-15: qty 2

## 2022-07-15 MED ORDER — SODIUM CHLORIDE 0.9 % IV SOLN
8.0000 mg/kg | Freq: Once | INTRAVENOUS | Status: AC
  Administered 2022-07-15: 720 mg via INTRAVENOUS
  Filled 2022-07-15: qty 72

## 2022-07-15 MED ORDER — SODIUM CHLORIDE 0.9 % IV SOLN
150.0000 mg | Freq: Once | INTRAVENOUS | Status: AC
  Administered 2022-07-15: 150 mg via INTRAVENOUS
  Filled 2022-07-15: qty 150

## 2022-07-15 MED ORDER — PALONOSETRON HCL INJECTION 0.25 MG/5ML
0.2500 mg | Freq: Once | INTRAVENOUS | Status: AC
  Administered 2022-07-15: 0.25 mg via INTRAVENOUS
  Filled 2022-07-15: qty 5

## 2022-07-15 MED ORDER — DIPHENHYDRAMINE HCL 50 MG/ML IJ SOLN
50.0000 mg | Freq: Once | INTRAMUSCULAR | Status: AC
  Administered 2022-07-15: 25 mg via INTRAVENOUS
  Filled 2022-07-15: qty 1

## 2022-07-15 MED ORDER — FAMOTIDINE IN NACL 20-0.9 MG/50ML-% IV SOLN
20.0000 mg | Freq: Once | INTRAVENOUS | Status: AC
  Administered 2022-07-15: 20 mg via INTRAVENOUS
  Filled 2022-07-15: qty 50

## 2022-07-15 MED ORDER — ACETAMINOPHEN 325 MG PO TABS
650.0000 mg | ORAL_TABLET | Freq: Once | ORAL | Status: AC
  Administered 2022-07-15: 650 mg via ORAL
  Filled 2022-07-15: qty 2

## 2022-07-15 NOTE — Progress Notes (Signed)
Hematology and Oncology Follow Up Visit  Ariana Herrera [id number] 1966/01/21 56 y.o. 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 is here today for follow-up.  She looks quite good.  She is married now.  She is happy about being married.  She has she got married back in July.  She is still smoking.  She still smokes about a pack a day.  She is still trying to cut back.  She feels okay.  She really has had no problems pain wise.  She is on a good regimen for her pain control.  She has had no nausea or vomiting.  There is been no bleeding.  She has had problems with constipation.  She is on Linzess.  I told her to try some olive oil and to take 1 tablespoon 3 times a day.  This is natural and this is an all-time remedy for constipation.  Her last CA 27.29 was little bit elevated at 50.  Her last PET scan was done back in July.  We will probably do another PET scan after her third cycle of treatment.  She has had no fever.  She has had no rash.  She has had no headache.  Overall, I would say performance status is probably ECOG 1.   Medications:  Allergies as of 07/15/2022       Reactions   Lithium Other (See Comments)   Spinal fluid built up in brain   Dulaglutide Nausea And Vomiting, Other (See Comments)   Penicillin G Other (See Comments)   Penicillins Other (See Comments)   UNKNOWN CHILDHOOD REACTION        Medication List        Accurate as of July 15, 2022  9:37 AM. If you have any questions, ask your nurse or doctor.          STOP taking these medications    dexamethasone 4 MG tablet Commonly known as: DECADRON Stopped by: Volanda Napoleon, MD    lidocaine-prilocaine cream Commonly known as: EMLA Stopped by: Volanda Napoleon, MD   loperamide 2 MG capsule Commonly known as: IMODIUM Stopped by: Volanda Napoleon, MD   ondansetron 8 MG tablet Commonly known as: Zofran Stopped by: Volanda Napoleon, MD   prochlorperazine 10 MG tablet Commonly known as: COMPAZINE Stopped by: Volanda Napoleon, MD       TAKE these medications    Accu-Chek Guide test strip Generic drug: glucose blood 3 (three) times daily.   albuterol 108 (90 Base) MCG/ACT inhaler Commonly known as: VENTOLIN HFA Inhale 2 puffs into the lungs every 6 (six) hours as needed for wheezing or shortness of breath.   B-D UF III MINI PEN NEEDLES 31G X 5 MM Misc Generic drug: Insulin Pen Needle Use to administer insulin 4 times daily   fenofibrate 145 MG tablet Commonly known as: TRICOR Take 145 mg by mouth daily.   FreeStyle Libre 2 Sensor Misc   HYDROcodone-acetaminophen 7.5-325 MG tablet Commonly known as: Norco Take 1 tablet by mouth every 4 (four) hours as needed for moderate pain.   insulin glargine 100 UNIT/ML injection Commonly known as: LANTUS Inject 25 Units into the skin at bedtime.  losartan 25 MG tablet Commonly known as: COZAAR Take 1 tablet (25 mg total) by mouth daily. What changed: how much to take   metFORMIN 1000 MG tablet Commonly known as: GLUCOPHAGE Take 1,000 mg by mouth 2 (two) times daily with a meal.   metoprolol succinate 25 MG 24 hr tablet Commonly known as: TOPROL-XL TAKE 1 AND 1/2 TABLETS BY MOUTH EVERY DAY What changed: how much to take   nitroGLYCERIN 0.4 MG SL tablet Commonly known as: NITROSTAT Place 1 tablet (0.4 mg total) under the tongue every 5 (five) minutes as needed for chest pain.   NovoLOG FlexPen 100 UNIT/ML FlexPen Generic drug: insulin aspart Inject 3 Units into the skin 3 (three) times daily with meals. What changed: how much to take   OLANZapine 10 MG tablet Commonly known as: ZYPREXA TAKE 1  TABLET BY MOUTH EVERYDAY AT BEDTIME   omeprazole 20 MG capsule Commonly known as: PRILOSEC Take 20 mg by mouth daily.   pravastatin 20 MG tablet Commonly known as: PRAVACHOL Take 20 mg by mouth daily. 03/09/2022 Patient states is taking only Pravastatin.   predniSONE 20 MG tablet Commonly known as: DELTASONE Take Prednisone 40 mg daily times five days   QUEtiapine 400 MG tablet Commonly known as: SEROQUEL Take 800 mg by mouth at bedtime.        Allergies:  Allergies  Allergen Reactions   Lithium Other (See Comments)    Spinal fluid built up in brain   Dulaglutide Nausea And Vomiting and Other (See Comments)   Penicillin G Other (See Comments)   Penicillins Other (See Comments)    UNKNOWN CHILDHOOD REACTION    Past Medical History, Surgical history, Social history, and Family History were reviewed and updated.  Review of Systems: Review of Systems  Constitutional:  Positive for malaise/fatigue.  HENT: Negative.    Eyes: Negative.   Respiratory: Negative.    Cardiovascular:  Positive for leg swelling.  Gastrointestinal:  Positive for nausea.  Genitourinary: Negative.   Musculoskeletal:  Positive for joint pain and myalgias.  Skin: Negative.   Neurological:  Positive for tingling.  Endo/Heme/Allergies: Negative.   Psychiatric/Behavioral: Negative.       Physical Exam:  height is '5\' 2"'$  (1.575 m) and weight is 189 lb (85.7 kg). Her oral temperature is 98.1 F (36.7 C). Her blood pressure is 143/79 (abnormal) and her pulse is 102 (abnormal). Her respiration is 19 and oxygen saturation is 100%.   Wt Readings from Last 3 Encounters:  07/15/22 189 lb (85.7 kg)  06/10/22 189 lb (85.7 kg)  05/06/22 188 lb (85.3 kg)    Physical Exam Vitals reviewed.  HENT:     Head: Normocephalic and atraumatic.  Eyes:     Pupils: Pupils are equal, round, and reactive to light.  Cardiovascular:     Rate and Rhythm: Normal rate and regular rhythm.     Heart sounds: Normal heart  sounds.  Pulmonary:     Effort: Pulmonary effort is normal.     Breath sounds: Normal breath sounds.  Abdominal:     General: Bowel sounds are normal.     Palpations: Abdomen is soft.  Musculoskeletal:        General: No tenderness or deformity. Normal range of motion.     Cervical back: Normal range of motion.  Lymphadenopathy:     Cervical: No cervical adenopathy.  Skin:    General: Skin is warm and dry.     Findings: No erythema or rash.  Neurological:  Mental Status: She is alert and oriented to person, place, and time.  Psychiatric:        Behavior: Behavior normal.        Thought Content: Thought content normal.        Judgment: Judgment normal.     Lab Results  Component Value Date   WBC 4.1 07/15/2022   HGB 11.6 (L) 07/15/2022   HCT 34.1 (L) 07/15/2022   MCV 103.0 (H) 07/15/2022   PLT 151 07/15/2022   Lab Results  Component Value Date   FERRITIN 38 05/06/2022   IRON 70 05/06/2022   TIBC 434 05/06/2022   UIBC 364 05/06/2022   IRONPCTSAT 16 05/06/2022   Lab Results  Component Value Date   RETICCTPCT 6.8 (H) 05/06/2022   RBC 3.31 (L) 07/15/2022   No results found for: "KPAFRELGTCHN", "LAMBDASER", "KAPLAMBRATIO" No results found for: "IGGSERUM", "IGA", "IGMSERUM" No results found for: "TOTALPROTELP", "ALBUMINELP", "A1GS", "A2GS", "BETS", "BETA2SER", "GAMS", "MSPIKE", "SPEI"   Chemistry      Component Value Date/Time   NA 140 07/01/2022 0904   K 3.8 07/01/2022 0904   CL 105 07/01/2022 0904   CO2 27 07/01/2022 0904   BUN 12 07/01/2022 0904   CREATININE 0.74 07/01/2022 0904      Component Value Date/Time   CALCIUM 9.6 07/01/2022 0904   ALKPHOS 71 07/01/2022 0904   AST 13 (L) 07/01/2022 0904   ALT 9 07/01/2022 0904   BILITOT 0.5 07/01/2022 0904       Impression and Plan: [name] is a very pleasant is a very pleasant 56 yo caucasian female with recurrent ductal carcinoma of the left breast.   We now have her on Trodelvy.  She had no problems  with the first cycle.  Her blood counts look a whole lot better than when they did with chemotherapy.  We will go ahead with her second cycle of chemotherapy with Ariana Herrera.  After the third cycle, we will then plan for another PET scan.  I am happy that she is married.  Her quality life is doing nicely right now.  She says that she gets bronchitis this time a year.  We will have to be cautious with this.  If she has any problems with productive sputum, we can then get her on some antibiotic.  We will plan to get her back in 3 weeks.      Volanda Napoleon, MD 9/21/20239:37 AM

## 2022-07-15 NOTE — Patient Instructions (Signed)
[facility]  Discharge Instructions: Thank you for choosing [facility] to provide your oncology and hematology care.   If you have a lab appointment with the Cancer Center, please go directly to the Cancer Center and check in at the registration area.  Wear comfortable clothing and clothing appropriate for easy access to any Portacath or PICC line.   We strive to give you quality time with your provider. You may need to reschedule your appointment if you arrive late (15 or more minutes).  Arriving late affects you and other patients whose appointments are after yours.  Also, if you miss three or more appointments without notifying the office, you may be dismissed from the clinic at the provider's discretion.      For prescription refill requests, have your pharmacy contact our office and allow 72 hours for refills to be completed.    Today you received the following chemotherapy and/or immunotherapy agents Trodelvy.      To help prevent nausea and vomiting after your treatment, we encourage you to take your nausea medication as directed.  BELOW ARE SYMPTOMS THAT SHOULD BE REPORTED IMMEDIATELY: *FEVER GREATER THAN 100.4 F (38 C) OR HIGHER *CHILLS OR SWEATING *NAUSEA AND VOMITING THAT IS NOT CONTROLLED WITH YOUR NAUSEA MEDICATION *UNUSUAL SHORTNESS OF BREATH *UNUSUAL BRUISING OR BLEEDING *URINARY PROBLEMS (pain or burning when urinating, or frequent urination) *BOWEL PROBLEMS (unusual diarrhea, constipation, pain near the anus) TENDERNESS IN MOUTH AND THROAT WITH OR WITHOUT PRESENCE OF ULCERS (sore throat, sores in mouth, or a toothache) UNUSUAL RASH, SWELLING OR PAIN  UNUSUAL VAGINAL DISCHARGE OR ITCHING   Items with * indicate a potential emergency and should be followed up as soon as possible or go to the Emergency Department if any problems should occur.  Please show the CHEMOTHERAPY ALERT CARD or IMMUNOTHERAPY ALERT CARD at check-in to the  Emergency Department and triage nurse. Should you have questions after your visit or need to cancel or reschedule your appointment, please contact [facility]  [phone] and follow the prompts.  Office hours are 8:00 a.m. to 4:30 p.m. Monday - Friday. Please note that voicemails left after 4:00 p.m. may not be returned until the following business day.  We are closed weekends and major holidays. You have access to a nurse at all times for urgent questions. Please call the main number to the clinic [phone] and follow the prompts.  For any non-urgent questions, you may also contact your provider using MyChart. We now offer e-Visits for anyone 18 and older to request care online for non-urgent symptoms. For details visit [email or web address].   Also download the MyChart app! Go to the app store, search "MyChart", open the app, select , and log in with your MyChart username and password.  Masks are optional in the cancer centers. If you would like for your care team to wear a mask while they are taking care of you, please let them know. You may have one support person who is at least 54 years old accompany you for your appointments. 

## 2022-07-15 NOTE — Patient Instructions (Signed)

## 2022-07-15 NOTE — Progress Notes (Signed)
Patient will need scans after her third cycle. Today she is receiving cycle two.   Oncology Nurse Navigator Documentation     07/15/2022    9:45 AM  Oncology Nurse Navigator Flowsheets  Navigator Follow Up Date: 08/05/2022  Navigator Follow Up Reason: Follow-up Appointment;Chemotherapy  Navigator Location [facility]  Navigator Encounter Type Treatment;Appt/Treatment Plan Review  Patient Visit Type MedOnc  Treatment Phase Active Tx  Barriers/Navigation Needs Coordination of Care;Education  Interventions Psycho-Social Support  Acuity Level 2-Minimal Needs (1-2 Barriers Identified)  Support Groups/Services Friends and Family  Time Spent with Patient 15

## 2022-07-15 NOTE — Addendum Note (Signed)
Addended by: Amelia Jo I on: 07/15/2022 09:42 AM   Modules accepted: Orders

## 2022-07-15 NOTE — Progress Notes (Signed)
Patient requests lower diphenhydramine dose. Dose changed to 25 mg per Dr.Ennever's instructions. HR parameters also changed to HR > 110 per his instructions.

## 2022-07-16 ENCOUNTER — Encounter: Payer: Self-pay | Admitting: Hematology & Oncology

## 2022-07-16 ENCOUNTER — Other Ambulatory Visit: Payer: Self-pay

## 2022-07-16 LAB — CANCER ANTIGEN 27.29: CA 27.29: 32.8 U/mL (ref 0.0–38.6)

## 2022-07-20 ENCOUNTER — Other Ambulatory Visit: Payer: Self-pay

## 2022-07-21 ENCOUNTER — Other Ambulatory Visit: Payer: Self-pay | Admitting: *Deleted

## 2022-07-21 ENCOUNTER — Other Ambulatory Visit (HOSPITAL_BASED_OUTPATIENT_CLINIC_OR_DEPARTMENT_OTHER): Payer: Self-pay

## 2022-07-21 ENCOUNTER — Other Ambulatory Visit: Payer: Self-pay | Admitting: Hematology & Oncology

## 2022-07-21 DIAGNOSIS — C50011 Malignant neoplasm of nipple and areola, right female breast: Secondary | ICD-10-CM

## 2022-07-21 DIAGNOSIS — Z171 Estrogen receptor negative status [ER-]: Secondary | ICD-10-CM

## 2022-07-21 DIAGNOSIS — M25512 Pain in left shoulder: Secondary | ICD-10-CM

## 2022-07-21 MED ORDER — HYDROCODONE-ACETAMINOPHEN 7.5-325 MG PO TABS
1.0000 | ORAL_TABLET | ORAL | 0 refills | Status: DC | PRN
  Filled 2022-07-21 – 2022-07-23 (×2): qty 90, 15d supply, fill #0

## 2022-07-22 ENCOUNTER — Inpatient Hospital Stay: Payer: Medicaid Other

## 2022-07-22 VITALS — BP 118/54 | HR 89 | Temp 98.1°F | Resp 18

## 2022-07-22 DIAGNOSIS — Z171 Estrogen receptor negative status [ER-]: Secondary | ICD-10-CM

## 2022-07-22 DIAGNOSIS — Z5112 Encounter for antineoplastic immunotherapy: Secondary | ICD-10-CM | POA: Diagnosis not present

## 2022-07-22 DIAGNOSIS — C50011 Malignant neoplasm of nipple and areola, right female breast: Secondary | ICD-10-CM

## 2022-07-22 LAB — CBC WITH DIFFERENTIAL (CANCER CENTER ONLY)
Abs Immature Granulocytes: 0.09 10*3/uL — ABNORMAL HIGH (ref 0.00–0.07)
Basophils Absolute: 0 10*3/uL (ref 0.0–0.1)
Basophils Relative: 0 %
Eosinophils Absolute: 0 10*3/uL (ref 0.0–0.5)
Eosinophils Relative: 0 %
HCT: 33.2 % — ABNORMAL LOW (ref 36.0–46.0)
Hemoglobin: 11.3 g/dL — ABNORMAL LOW (ref 12.0–15.0)
Immature Granulocytes: 2 %
Lymphocytes Relative: 32 %
Lymphs Abs: 1.5 10*3/uL (ref 0.7–4.0)
MCH: 34.9 pg — ABNORMAL HIGH (ref 26.0–34.0)
MCHC: 34 g/dL (ref 30.0–36.0)
MCV: 102.5 fL — ABNORMAL HIGH (ref 80.0–100.0)
Monocytes Absolute: 0.5 10*3/uL (ref 0.1–1.0)
Monocytes Relative: 10 %
Neutro Abs: 2.6 10*3/uL (ref 1.7–7.7)
Neutrophils Relative %: 56 %
Platelet Count: 132 10*3/uL — ABNORMAL LOW (ref 150–400)
RBC: 3.24 MIL/uL — ABNORMAL LOW (ref 3.87–5.11)
RDW: 16.7 % — ABNORMAL HIGH (ref 11.5–15.5)
Smear Review: NORMAL
WBC Count: 4.7 10*3/uL (ref 4.0–10.5)
nRBC: 0.6 % — ABNORMAL HIGH (ref 0.0–0.2)

## 2022-07-22 LAB — CMP (CANCER CENTER ONLY)
ALT: 20 U/L (ref 0–44)
AST: 15 U/L (ref 15–41)
Albumin: 4 g/dL (ref 3.5–5.0)
Alkaline Phosphatase: 66 U/L (ref 38–126)
Anion gap: 7 (ref 5–15)
BUN: 13 mg/dL (ref 6–20)
CO2: 24 mmol/L (ref 22–32)
Calcium: 9.2 mg/dL (ref 8.9–10.3)
Chloride: 103 mmol/L (ref 98–111)
Creatinine: 0.77 mg/dL (ref 0.44–1.00)
GFR, Estimated: >60 mL/min (ref >60)
Glucose, Bld: 249 mg/dL — ABNORMAL HIGH (ref 70–99)
Potassium: 4.1 mmol/L (ref 3.5–5.1)
Sodium: 134 mmol/L — ABNORMAL LOW (ref 135–145)
Total Bilirubin: 0.4 mg/dL (ref 0.3–1.2)
Total Protein: 6.6 g/dL (ref 6.5–8.1)

## 2022-07-22 MED ORDER — SODIUM CHLORIDE 0.9 % IV SOLN: Freq: Once | INTRAVENOUS | Status: AC

## 2022-07-22 MED ORDER — SODIUM CHLORIDE 0.9 % IV SOLN
150.0000 mg | Freq: Once | INTRAVENOUS | Status: AC
  Administered 2022-07-22: 150 mg via INTRAVENOUS
  Filled 2022-07-22: qty 150

## 2022-07-22 MED ORDER — DIPHENHYDRAMINE HCL 50 MG/ML IJ SOLN
25.0000 mg | Freq: Once | INTRAMUSCULAR | Status: AC
  Administered 2022-07-22: 25 mg via INTRAVENOUS
  Filled 2022-07-22: qty 1

## 2022-07-22 MED ORDER — HEPARIN SOD (PORK) LOCK FLUSH 100 UNIT/ML IV SOLN
500.0000 [IU] | Freq: Once | INTRAVENOUS | Status: AC | PRN
  Administered 2022-07-22: 500 [IU]

## 2022-07-22 MED ORDER — SODIUM CHLORIDE 0.9 % IV SOLN
10.0000 mg | Freq: Once | INTRAVENOUS | Status: AC
  Administered 2022-07-22: 10 mg via INTRAVENOUS
  Filled 2022-07-22: qty 10

## 2022-07-22 MED ORDER — SODIUM CHLORIDE 0.9% FLUSH
10.0000 mL | INTRAVENOUS | Status: DC | PRN
  Administered 2022-07-22: 10 mL

## 2022-07-22 MED ORDER — SODIUM CHLORIDE 0.9 % IV SOLN
8.0000 mg/kg | Freq: Once | INTRAVENOUS | Status: AC
  Administered 2022-07-22: 720 mg via INTRAVENOUS
  Filled 2022-07-22: qty 72

## 2022-07-22 MED ORDER — FAMOTIDINE IN NACL 20-0.9 MG/50ML-% IV SOLN
20.0000 mg | Freq: Once | INTRAVENOUS | Status: AC
  Administered 2022-07-22: 20 mg via INTRAVENOUS
  Filled 2022-07-22: qty 50

## 2022-07-22 MED ORDER — ACETAMINOPHEN 325 MG PO TABS
650.0000 mg | ORAL_TABLET | Freq: Once | ORAL | Status: AC
  Administered 2022-07-22: 650 mg via ORAL
  Filled 2022-07-22: qty 2

## 2022-07-22 MED ORDER — PALONOSETRON HCL INJECTION 0.25 MG/5ML
0.2500 mg | Freq: Once | INTRAVENOUS | Status: AC
  Administered 2022-07-22: 0.25 mg via INTRAVENOUS
  Filled 2022-07-22: qty 5

## 2022-07-22 MED ORDER — HYDROCODONE-ACETAMINOPHEN 5-325 MG PO TABS
1.0000 | ORAL_TABLET | Freq: Once | ORAL | Status: AC
  Administered 2022-07-22: 1 via ORAL
  Filled 2022-07-22: qty 1

## 2022-07-22 NOTE — Patient Instructions (Signed)
Implanted Port Removal  Implanted port removal is a procedure to remove the port and catheter that are implanted under your skin. The port is a small disc under your skin that can be punctured with a needle. It is connected to a vein in your chest or neck by a small, thin tube (catheter). The implanted port is used to give medicines for treatments, and it may also be used to take blood samples. Your health care provider will remove the implanted port if: You no longer need it for treatment. It is not working properly. The area around it gets infected. Tell a health care provider about: Any allergies you have. All medicines you are taking, including vitamins, herbs, eye drops, creams, and over-the-counter medicines. Any problems you or family members have had with anesthetic medicines. Any bleeding problems you have. Any surgeries you have had. Any medical conditions you have. Whether you are pregnant or may be pregnant. What are the risks? Generally, this is a safe procedure. However, problems may occur, including: Infection. Bleeding. Allergic reactions to anesthetic medicines. Damage to nerves or blood vessels. What happens before the procedure? Medicines Ask your health care provider about: Changing or stopping your regular medicines. This is especially important if you are taking diabetes medicines or blood thinners. Taking medicines such as aspirin and ibuprofen. These medicines can thin your blood. Do not take these medicines unless your health care provider tells you to take them. Taking over-the-counter medicines, vitamins, herbs, and supplements. Tests You will have: A physical exam. Blood tests. Imaging tests, including a chest X-ray. General instructions Follow instructions from your health care provider about eating or drinking restrictions. Ask your health care provider: How your surgery site will be marked. What steps will be taken to help prevent infection. These  steps may include: Removing hair at the surgery site. Washing skin with a germ-killing soap. Taking antibiotic medicine. If you will be going home right after the procedure, plan to have a responsible adult: Take you home from the hospital or clinic. You will not be allowed to drive. Care for you for the time you are told. What happens during the procedure? You may be given one or more of the following: A medicine to help you relax (sedative). A medicine to numb the area (local anesthetic). A small incision will be made at the site of your implanted port. The implanted port and the catheter that has been inside your vein will be gently removed. The port and catheter will be inspected to make sure that all the parts have been removed. Part of the catheter may be tested for bacteria. The incision will be closed with stitches (sutures), adhesive strips, or skin glue. A bandage (dressing) will be placed over the incision. The health care provider may apply gentle pressure over the dressing for about 5 minutes. The procedure may vary among health care providers and hospitals. What happens after the procedure? Your blood pressure, heart rate, breathing rate, and blood oxygen level will be monitored until you leave the hospital or clinic. You will be monitored to make sure that there is no bleeding from the site where the port was removed. If you were given a sedative during the procedure, it can affect you for several hours. Do not drive or operate machinery until your health care provider says that it is safe. Summary Implanted port removal is a procedure to remove the port and catheter that are implanted under your skin. Before the procedure, follow your health  care provider's instructions about changing or stopping your regular medicines. This is especially important if you are taking diabetes medicines or blood thinners. If you will be going home right after the procedure, plan to have a  responsible adult care for you for the time you are told. This information is not intended to replace advice given to you by your health care provider. Make sure you discuss any questions you have with your health care provider. Document Revised: 04/14/2021 Document Reviewed: 04/14/2021 Elsevier Patient Education  [place].

## 2022-07-22 NOTE — Progress Notes (Signed)
Patient stated tha she's in pain and asked for pain medication. Vicondin 5 mg per Dr. Marin Olp given.

## 2022-07-22 NOTE — Patient Instructions (Signed)
[facility]  Discharge Instructions: Thank you for choosing [facility] to provide your oncology and hematology care.   If you have a lab appointment with the Cancer Center, please go directly to the Cancer Center and check in at the registration area.  Wear comfortable clothing and clothing appropriate for easy access to any Portacath or PICC line.   We strive to give you quality time with your provider. You may need to reschedule your appointment if you arrive late (15 or more minutes).  Arriving late affects you and other patients whose appointments are after yours.  Also, if you miss three or more appointments without notifying the office, you may be dismissed from the clinic at the provider's discretion.      For prescription refill requests, have your pharmacy contact our office and allow 72 hours for refills to be completed.    Today you received the following chemotherapy and/or immunotherapy agents Trodelvy.      To help prevent nausea and vomiting after your treatment, we encourage you to take your nausea medication as directed.  BELOW ARE SYMPTOMS THAT SHOULD BE REPORTED IMMEDIATELY: *FEVER GREATER THAN 100.4 F (38 C) OR HIGHER *CHILLS OR SWEATING *NAUSEA AND VOMITING THAT IS NOT CONTROLLED WITH YOUR NAUSEA MEDICATION *UNUSUAL SHORTNESS OF BREATH *UNUSUAL BRUISING OR BLEEDING *URINARY PROBLEMS (pain or burning when urinating, or frequent urination) *BOWEL PROBLEMS (unusual diarrhea, constipation, pain near the anus) TENDERNESS IN MOUTH AND THROAT WITH OR WITHOUT PRESENCE OF ULCERS (sore throat, sores in mouth, or a toothache) UNUSUAL RASH, SWELLING OR PAIN  UNUSUAL VAGINAL DISCHARGE OR ITCHING   Items with * indicate a potential emergency and should be followed up as soon as possible or go to the Emergency Department if any problems should occur.  Please show the CHEMOTHERAPY ALERT CARD or IMMUNOTHERAPY ALERT CARD at check-in to the  Emergency Department and triage nurse. Should you have questions after your visit or need to cancel or reschedule your appointment, please contact [facility]  [phone] and follow the prompts.  Office hours are 8:00 a.m. to 4:30 p.m. Monday - Friday. Please note that voicemails left after 4:00 p.m. may not be returned until the following business day.  We are closed weekends and major holidays. You have access to a nurse at all times for urgent questions. Please call the main number to the clinic [phone] and follow the prompts.  For any non-urgent questions, you may also contact your provider using MyChart. We now offer e-Visits for anyone 18 and older to request care online for non-urgent symptoms. For details visit [email or web address].   Also download the MyChart app! Go to the app store, search "MyChart", open the app, select [facility], and log in with your MyChart username and password.  Masks are optional in the cancer centers. If you would like for your care team to wear a mask while they are taking care of you, please let them know. You may have one support person who is at least 54 years old accompany you for your appointments. 

## 2022-07-23 ENCOUNTER — Other Ambulatory Visit (HOSPITAL_BASED_OUTPATIENT_CLINIC_OR_DEPARTMENT_OTHER): Payer: Self-pay

## 2022-07-24 ENCOUNTER — Other Ambulatory Visit: Payer: Self-pay

## 2022-07-26 ENCOUNTER — Other Ambulatory Visit: Payer: Self-pay

## 2022-07-27 ENCOUNTER — Encounter: Payer: Self-pay | Admitting: Hematology & Oncology

## 2022-07-27 DIAGNOSIS — J209 Acute bronchitis, unspecified: Secondary | ICD-10-CM

## 2022-07-27 MED ORDER — AZITHROMYCIN 250 MG PO TABS: ORAL_TABLET | ORAL | 0 refills | Status: DC

## 2022-07-30 ENCOUNTER — Encounter: Payer: Self-pay | Admitting: Internal Medicine

## 2022-07-30 ENCOUNTER — Ambulatory Visit: Payer: Medicaid Other | Attending: Internal Medicine | Admitting: Internal Medicine

## 2022-07-30 VITALS — BP 100/52 | HR 101 | Ht 62.0 in | Wt 187.0 lb

## 2022-07-30 DIAGNOSIS — I2510 Atherosclerotic heart disease of native coronary artery without angina pectoris: Secondary | ICD-10-CM | POA: Diagnosis not present

## 2022-07-30 DIAGNOSIS — I10 Essential (primary) hypertension: Secondary | ICD-10-CM

## 2022-07-30 DIAGNOSIS — E785 Hyperlipidemia, unspecified: Secondary | ICD-10-CM

## 2022-07-30 NOTE — Progress Notes (Signed)
OFFICE NOTE  Chief Complaint:  Follow-up CAD  Primary Care Physician: Ariana Sellar, MD  HPI:  Ariana Herrera is a 56 y.o. female with a past medial history significant for mild nonobstructive coronary disease by cath in 2021, dyslipidemia, type 2 diabetes, ongoing tobacco abuse and breast cancer on maintenance therapy.  She returns today for follow-up.  Recently she saw Ariana Herrera.  He has been modifying her medical therapies.  In the interim since I saw her she had an episode of pancreatitis related to Trulicity.  Fortunately that has resolved.  Her triglycerides have been up in the past however she is on fenofibrate and statin and they were normal last year.  She is due for repeat lipid profile.  She denies any chest pain or shortness of breath.  PMHx:  Past Medical History:  Diagnosis Date   Anginal pain ([place])    Asthma    Bipolar 1 disorder ([place])    Bipolar disease, chronic ([place])    Breast cancer in female ([place])    2019 and recurrent in 2023 to right breast, right axillary, and left neck   Coronary artery disease    Deep vein blood clot of right lower extremity ([place])    Diabetes mellitus type 2 in obese ([place])    Goals of care, counseling/discussion 11/23/2021   Hyperlipidemia    MI (myocardial infarction) ([place])    was in [place]   Obesity    Personal history of chemotherapy    Personal history of radiation therapy     Past Surgical History:  Procedure Laterality Date   BREAST LUMPECTOMY Left 03/2019   IR IMAGING GUIDED PORT INSERTION  12/15/2021   IR REMOVAL TUN ACCESS W/ PORT W/O FL MOD SED  12/20/2019   IR US GUIDE BX ASP/DRAIN  11/02/2021   LEFT HEART CATH AND CORONARY ANGIOGRAPHY N/A 08/22/2020   Procedure: LEFT HEART CATH AND CORONARY ANGIOGRAPHY;  Surgeon: Burnell Blanks, MD;  Location: [facility];  Service: Cardiovascular;  Laterality: N/A;    FAMHx:  Family History  Problem Relation Age of Onset   Heart attack Mother 74    SOCHx:    reports that she has been smoking cigarettes. She started smoking about 2 years ago. She has a 36.00 pack-year smoking history. She has never used smokeless tobacco. She reports current drug use. Drug: Marijuana. She reports that she does not drink alcohol.  ALLERGIES:  Allergies  Allergen Reactions   Lithium Other (See Comments)    Spinal fluid built up in brain   Dulaglutide Nausea And Vomiting and Other (See Comments)   Penicillin G Other (See Comments)   Penicillins Other (See Comments)    UNKNOWN CHILDHOOD REACTION    ROS: Pertinent items noted in HPI and remainder of comprehensive ROS otherwise negative.  HOME MEDS: Current Outpatient Medications on File Prior to Visit  Medication Sig Dispense Refill   ACCU-CHEK GUIDE test strip 3 (three) times daily.     albuterol (VENTOLIN HFA) 108 (90 Base) MCG/ACT inhaler Inhale 2 puffs into the lungs every 6 (six) hours as needed for wheezing or shortness of breath.     azithromycin (ZITHROMAX) 250 MG tablet Take as directed. 6 each 0   Continuous Blood Gluc Sensor (FREESTYLE LIBRE 2 SENSOR) MISC      fenofibrate (TRICOR) 145 MG tablet Take 145 mg by mouth daily.     HYDROcodone-acetaminophen (NORCO) 7.5-325 MG tablet Take 1 tablet by mouth every 4 (four) hours  as needed for moderate pain. 90 tablet 0   insulin aspart (NOVOLOG FLEXPEN) 100 UNIT/ML FlexPen Inject 3 Units into the skin 3 (three) times daily with meals. (Patient taking differently: Inject 10-16 Units into the skin 3 (three) times daily with meals.) 15 mL 0   insulin glargine (LANTUS) 100 UNIT/ML injection Inject 25 Units into the skin at bedtime.     Insulin Pen Needle (B-D UF III MINI PEN NEEDLES) 31G X 5 MM MISC Use to administer insulin 4 times daily     losartan (COZAAR) 25 MG tablet Take 1 tablet (25 mg total) by mouth daily. (Patient taking differently: Take 12.5 mg by mouth daily.) 30 tablet 0   metFORMIN (GLUCOPHAGE) 1000 MG tablet Take 1,000 mg by mouth 2 (two) times  daily with a meal.     metoprolol succinate (TOPROL-XL) 25 MG 24 hr tablet TAKE 1 AND 1/2 TABLETS BY MOUTH EVERY DAY (Patient taking differently: Take 25 mg by mouth daily.) 45 tablet 10   nitroGLYCERIN (NITROSTAT) 0.4 MG SL tablet Place 1 tablet (0.4 mg total) under the tongue every 5 (five) minutes as needed for chest pain. 25 tablet 3   OLANZapine (ZYPREXA) 10 MG tablet TAKE 1 TABLET BY MOUTH EVERYDAY AT BEDTIME 90 tablet 2   omeprazole (PRILOSEC) 20 MG capsule Take 20 mg by mouth daily.     pravastatin (PRAVACHOL) 20 MG tablet Take 20 mg by mouth daily. 03/09/2022 Patient states is taking only Pravastatin.     predniSONE (DELTASONE) 20 MG tablet Take Prednisone 40 mg daily times five days 10 tablet 0   QUEtiapine (SEROQUEL) 400 MG tablet Take 800 mg by mouth at bedtime.     Current Facility-Administered Medications on File Prior to Visit  Medication Dose Route Frequency Provider Last Rate Last Admin   sodium chloride flush (NS) 0.9 % injection 10 mL  10 mL Intravenous PRN Ariana Napoleon, MD   10 mL at 02/02/22 0848   sodium chloride flush (NS) 0.9 % injection 10 mL  10 mL Intravenous PRN Ariana Napoleon, MD   10 mL at 02/15/22 1219   sodium chloride flush (NS) 0.9 % injection 10 mL  10 mL Intravenous PRN Ariana Napoleon, MD   10 mL at 04/13/22 1241   sodium chloride flush (NS) 0.9 % injection 10 mL  10 mL Intravenous PRN Ariana Napoleon, MD   10 mL at 05/25/22 1214   sodium chloride flush (NS) 0.9 % injection 10 mL  10 mL Intracatheter PRN Ariana Napoleon, MD   10 mL at 07/15/22 1357    LABS/IMAGING: No results found for this or any previous visit (from the past 48 hour(s)). No results found.  LIPID PANEL:    Component Value Date/Time   CHOL 128 07/13/2021 0831   TRIG 145 07/13/2021 0831   HDL 35 (L) 07/13/2021 0831   CHOLHDL 3.7 07/13/2021 0831   CHOLHDL 4.7 08/21/2020 0558   VLDL 45 (H) 08/21/2020 0558   LDLCALC 68 07/13/2021 0831     WEIGHTS: Wt Readings from Last 3  Encounters:  07/30/22 187 lb (84.8 kg)  07/15/22 189 lb (85.7 kg)  06/10/22 189 lb (85.7 kg)    VITALS: BP (!) 100/52 (BP Location: Right Arm, Patient Position: Sitting, Cuff Size: Normal)   Pulse (!) 101   Ht '5\' 2"'$  (1.575 m)   Wt 187 lb (84.8 kg)   LMP 10/04/2016 Comment: after "procedure to stop bleeding"  BMI 34.20 kg/m  EXAM: General appearance: alert, no distress, and mildly obese Neck: no carotid bruit, no JVD, and thyroid not enlarged, symmetric, no tenderness/mass/nodules Lungs: wheezes bilaterally Heart: regular rate and rhythm, S1, S2 normal, no murmur, click, rub or gallop Abdomen: soft, non-tender; bowel sounds normal; no masses,  no organomegaly Extremities: extremities normal, atraumatic, no cyanosis or edema Pulses: 2+ and symmetric Skin: Skin color, texture, turgor normal. No rashes or lesions Neurologic: Grossly normal Psych: Pleasant  EKG: Sinus tachycardia at 101- personally reviewed  ASSESSMENT: Mild nonobstructive disease by cath in 2021 Type 2 diabetes Mixed dyslipidemia Ongoing tobacco abuse Breast cancer on maintenance therapy  PLAN: 1.   Ms. Ariana Herrera has a history of nonobstructive coronary disease by cath without any recurrent chest pain.  She has type 2 diabetes as well as mixed dyslipidemia and ongoing tobacco abuse.  She is try to quit before but recently has cut back from 2 packs a day to 1 pack/day.  She is not yet ready to quit.  Blood pressure is well controlled.  Her cholesterol is due for reassessment.  We will order lipid profile to be performed fasting with her labs coming up at the cancer center on the 12th.  Otherwise no changes to her medicines at this time.  Plan follow-up with me or APP annually or sooner as necessary.  Ariana Casino, MD, [facility], [place] Director of the [facility]  [facility] Diplomate of the American Board of Clinical  Lipidology Attending Cardiologist  Direct Dial: [phone]  Fax: [phone]  Website:  [email or web address] Ariana Herrera 07/30/2022, 8:18 AM

## 2022-07-30 NOTE — Patient Instructions (Signed)
Medication Instructions:  Your physician recommends that you continue on your current medications as directed. Please refer to the Current Medication list given to you today.  *If you need a refill on your cardiac medications before your next appointment, please call your pharmacy*  Lab Work: Lipid panel drawn at upcoming lab draw at [facility] provided  Follow-Up: At [facility], you and your health needs are our priority.  As part of our continuing mission to provide you with exceptional heart care, we have created designated Provider Care Teams.  These Care Teams include your primary Cardiologist (physician) and Advanced Practice Providers (APPs -  Physician Assistants and Nurse Practitioners) who all work together to provide you with the care you need, when you need it.  We recommend signing up for the patient portal called "MyChart".  Sign up information is provided on this After Visit Summary.  MyChart is used to connect with patients for Virtual Visits (Telemedicine).  Patients are able to view lab/test results, encounter notes, upcoming appointments, etc.  Non-urgent messages can be sent to your provider as well.   To learn more about what you can do with MyChart, go to [email or web address].    Your next appointment:   12 month(s)  The format for your next appointment:   In Person  Provider:   Pixie Casino, MD

## 2022-08-05 ENCOUNTER — Other Ambulatory Visit: Payer: Self-pay

## 2022-08-05 ENCOUNTER — Other Ambulatory Visit: Payer: Self-pay | Admitting: Hematology & Oncology

## 2022-08-05 ENCOUNTER — Encounter: Payer: Self-pay | Admitting: *Deleted

## 2022-08-05 ENCOUNTER — Inpatient Hospital Stay: Payer: Medicaid Other

## 2022-08-05 ENCOUNTER — Inpatient Hospital Stay: Payer: Medicaid Other | Attending: Hematology & Oncology

## 2022-08-05 ENCOUNTER — Other Ambulatory Visit (HOSPITAL_BASED_OUTPATIENT_CLINIC_OR_DEPARTMENT_OTHER): Payer: Self-pay

## 2022-08-05 ENCOUNTER — Encounter: Payer: Self-pay | Admitting: Hematology & Oncology

## 2022-08-05 ENCOUNTER — Inpatient Hospital Stay (HOSPITAL_BASED_OUTPATIENT_CLINIC_OR_DEPARTMENT_OTHER): Payer: Medicaid Other | Admitting: Hematology & Oncology

## 2022-08-05 VITALS — BP 140/66 | HR 106 | Temp 98.0°F | Resp 17 | Ht 62.0 in | Wt 180.0 lb

## 2022-08-05 VITALS — BP 142/78 | HR 87

## 2022-08-05 DIAGNOSIS — Z5112 Encounter for antineoplastic immunotherapy: Secondary | ICD-10-CM | POA: Insufficient documentation

## 2022-08-05 DIAGNOSIS — C50011 Malignant neoplasm of nipple and areola, right female breast: Secondary | ICD-10-CM

## 2022-08-05 DIAGNOSIS — F17210 Nicotine dependence, cigarettes, uncomplicated: Secondary | ICD-10-CM | POA: Diagnosis not present

## 2022-08-05 DIAGNOSIS — Z171 Estrogen receptor negative status [ER-]: Secondary | ICD-10-CM | POA: Diagnosis not present

## 2022-08-05 DIAGNOSIS — Z79899 Other long term (current) drug therapy: Secondary | ICD-10-CM | POA: Insufficient documentation

## 2022-08-05 DIAGNOSIS — C50912 Malignant neoplasm of unspecified site of left female breast: Secondary | ICD-10-CM | POA: Diagnosis present

## 2022-08-05 DIAGNOSIS — M25512 Pain in left shoulder: Secondary | ICD-10-CM

## 2022-08-05 LAB — CBC WITH DIFFERENTIAL (CANCER CENTER ONLY)
Abs Immature Granulocytes: 0.02 10*3/uL (ref 0.00–0.07)
Basophils Absolute: 0 10*3/uL (ref 0.0–0.1)
Basophils Relative: 0 %
Eosinophils Absolute: 0 10*3/uL (ref 0.0–0.5)
Eosinophils Relative: 0 %
HCT: 35.7 % — ABNORMAL LOW (ref 36.0–46.0)
Hemoglobin: 12.1 g/dL (ref 12.0–15.0)
Immature Granulocytes: 0 %
Lymphocytes Relative: 26 %
Lymphs Abs: 1.3 10*3/uL (ref 0.7–4.0)
MCH: 34.1 pg — ABNORMAL HIGH (ref 26.0–34.0)
MCHC: 33.9 g/dL (ref 30.0–36.0)
MCV: 100.6 fL — ABNORMAL HIGH (ref 80.0–100.0)
Monocytes Absolute: 0.6 10*3/uL (ref 0.1–1.0)
Monocytes Relative: 13 %
Neutro Abs: 3 10*3/uL (ref 1.7–7.7)
Neutrophils Relative %: 61 %
Platelet Count: 143 10*3/uL — ABNORMAL LOW (ref 150–400)
RBC: 3.55 MIL/uL — ABNORMAL LOW (ref 3.87–5.11)
RDW: 16.1 % — ABNORMAL HIGH (ref 11.5–15.5)
WBC Count: 5 10*3/uL (ref 4.0–10.5)
nRBC: 0 % (ref 0.0–0.2)

## 2022-08-05 LAB — CMP (CANCER CENTER ONLY)
ALT: 25 U/L (ref 0–44)
AST: 29 U/L (ref 15–41)
Albumin: 4 g/dL (ref 3.5–5.0)
Alkaline Phosphatase: 70 U/L (ref 38–126)
Anion gap: 9 (ref 5–15)
BUN: 13 mg/dL (ref 6–20)
CO2: 24 mmol/L (ref 22–32)
Calcium: 9.5 mg/dL (ref 8.9–10.3)
Chloride: 106 mmol/L (ref 98–111)
Creatinine: 0.82 mg/dL (ref 0.44–1.00)
GFR, Estimated: >60 mL/min (ref >60)
Glucose, Bld: 218 mg/dL — ABNORMAL HIGH (ref 70–99)
Potassium: 3.5 mmol/L (ref 3.5–5.1)
Sodium: 139 mmol/L (ref 135–145)
Total Bilirubin: 0.4 mg/dL (ref 0.3–1.2)
Total Protein: 6.7 g/dL (ref 6.5–8.1)

## 2022-08-05 LAB — FERRITIN: Ferritin: 42 ng/mL (ref 11–307)

## 2022-08-05 LAB — RETICULOCYTES
Immature Retic Fract: 22.6 % — ABNORMAL HIGH (ref 2.3–15.9)
RBC.: 3.53 MIL/uL — ABNORMAL LOW (ref 3.87–5.11)
Retic Count, Absolute: 141.9 10*3/uL (ref 19.0–186.0)
Retic Ct Pct: 4 % — ABNORMAL HIGH (ref 0.4–3.1)

## 2022-08-05 LAB — IRON AND IRON BINDING CAPACITY (CC-WL,HP ONLY)
Iron: 57 ug/dL (ref 28–170)
Saturation Ratios: 16 % (ref 10.4–31.8)
TIBC: 360 ug/dL (ref 250–450)
UIBC: 303 ug/dL (ref 148–442)

## 2022-08-05 MED ORDER — HYDROCODONE-ACETAMINOPHEN 7.5-325 MG PO TABS
1.0000 | ORAL_TABLET | ORAL | 0 refills | Status: DC | PRN
  Filled 2022-08-05 (×2): qty 90, 15d supply, fill #0

## 2022-08-05 MED ORDER — DIPHENHYDRAMINE HCL 50 MG/ML IJ SOLN
25.0000 mg | Freq: Once | INTRAMUSCULAR | Status: AC
  Administered 2022-08-05: 25 mg via INTRAVENOUS
  Filled 2022-08-05: qty 1

## 2022-08-05 MED ORDER — SODIUM CHLORIDE 0.9 % IV SOLN
150.0000 mg | Freq: Once | INTRAVENOUS | Status: AC
  Administered 2022-08-05: 150 mg via INTRAVENOUS
  Filled 2022-08-05: qty 150

## 2022-08-05 MED ORDER — SODIUM CHLORIDE 0.9 % IV SOLN
600.0000 mg | Freq: Once | INTRAVENOUS | Status: AC
  Administered 2022-08-05: 600 mg via INTRAVENOUS
  Filled 2022-08-05: qty 60

## 2022-08-05 MED ORDER — FAMOTIDINE IN NACL 20-0.9 MG/50ML-% IV SOLN
20.0000 mg | Freq: Once | INTRAVENOUS | Status: AC
  Administered 2022-08-05: 20 mg via INTRAVENOUS
  Filled 2022-08-05: qty 50

## 2022-08-05 MED ORDER — SODIUM CHLORIDE 0.9 % IV SOLN: Freq: Once | INTRAVENOUS | Status: AC

## 2022-08-05 MED ORDER — ATROPINE SULFATE 1 MG/ML IV SOLN
0.5000 mg | Freq: Once | INTRAVENOUS | Status: DC | PRN
  Filled 2022-08-05: qty 1

## 2022-08-05 MED ORDER — ACETAMINOPHEN 325 MG PO TABS
650.0000 mg | ORAL_TABLET | Freq: Once | ORAL | Status: AC
  Administered 2022-08-05: 650 mg via ORAL
  Filled 2022-08-05: qty 2

## 2022-08-05 MED ORDER — SODIUM CHLORIDE 0.9% FLUSH
10.0000 mL | INTRAVENOUS | Status: DC | PRN
  Administered 2022-08-05: 10 mL

## 2022-08-05 MED ORDER — HYDROCODONE-ACETAMINOPHEN 5-325 MG PO TABS
1.0000 | ORAL_TABLET | Freq: Once | ORAL | Status: AC
  Administered 2022-08-05: 1 via ORAL
  Filled 2022-08-05: qty 1

## 2022-08-05 MED ORDER — SODIUM CHLORIDE 0.9 % IV SOLN
10.0000 mg | Freq: Once | INTRAVENOUS | Status: AC
  Administered 2022-08-05: 10 mg via INTRAVENOUS
  Filled 2022-08-05: qty 10

## 2022-08-05 MED ORDER — PALONOSETRON HCL INJECTION 0.25 MG/5ML
0.2500 mg | Freq: Once | INTRAVENOUS | Status: AC
  Administered 2022-08-05: 0.25 mg via INTRAVENOUS
  Filled 2022-08-05: qty 5

## 2022-08-05 MED ORDER — HEPARIN SOD (PORK) LOCK FLUSH 100 UNIT/ML IV SOLN
500.0000 [IU] | Freq: Once | INTRAVENOUS | Status: AC | PRN
  Administered 2022-08-05: 500 [IU]

## 2022-08-05 NOTE — Progress Notes (Signed)
Patient has had weight loss. Will decrease dose to 600 mg (dose adjusted for vial size) to correspond with current weight per Dr. Antonieta Pert instructions.

## 2022-08-05 NOTE — Progress Notes (Signed)
Hematology and Oncology Follow Up Visit  Ariana Herrera [id number] 04-12-1966 56 y.o. 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 Ariana Herrera is here today for follow-up.  I feel bad that she has lost her hair.  I know this is caused a little bit of depression for her.  I totally understand this.  Otherwise, she seems to be doing pretty well with the [place].  She is still smoking.  She probably smokes half a pack a day of cigarettes.  Her last CA 27.29 was actually down to 33.  I am happy about this.  She still has a little bit of constipation.  It is better than when we last saw her.  There is been no bleeding.  She has had no nausea or vomiting.  She had little bit of a cough.  There is been no rashes.  There is been no leg swelling.  Overall, I would say performance status is probably ECOG 1.    Medications:  Allergies as of 08/05/2022       Reactions   Lithium Other (See Comments)   Spinal fluid built up in brain   Dulaglutide Nausea And Vomiting, Other (See Comments)   Penicillin G Other (See Comments)   Penicillins Other (See Comments)   UNKNOWN CHILDHOOD REACTION        Medication List        Accurate as of August 05, 2022  9:37 AM. If you have any questions, ask your nurse or doctor.          STOP taking these medications    azithromycin 250 MG tablet Commonly known as: Zithromax Stopped by: Volanda Napoleon, MD   predniSONE 20 MG tablet Commonly known as: DELTASONE Stopped by: Volanda Napoleon, MD       TAKE these medications    Accu-Chek Guide test strip Generic drug: glucose blood 3 (three) times daily.   albuterol 108 (90 Base) MCG/ACT inhaler Commonly  known as: VENTOLIN HFA Inhale 2 puffs into the lungs every 6 (six) hours as needed for wheezing or shortness of breath.   B-D UF III MINI PEN NEEDLES 31G X 5 MM Misc Generic drug: Insulin Pen Needle Use to administer insulin 4 times daily   fenofibrate 145 MG tablet Commonly known as: TRICOR Take 145 mg by mouth daily.   FreeStyle Libre 2 Sensor Misc   HYDROcodone-acetaminophen 7.5-325 MG tablet Commonly known as: Norco Take 1 tablet by mouth every 4 (four) hours as needed for moderate pain.   insulin glargine 100 UNIT/ML injection Commonly known as: LANTUS Inject 25 Units into the skin at bedtime.   losartan 25 MG tablet Commonly known as: COZAAR Take 1 tablet (25 mg total) by mouth daily. What changed: how much to take   metFORMIN 1000 MG tablet Commonly known as: GLUCOPHAGE Take 1,000 mg by mouth 2 (two) times daily with a meal.   metoprolol succinate 25 MG 24 hr tablet Commonly known as: TOPROL-XL TAKE 1 AND 1/2 TABLETS BY MOUTH EVERY DAY What changed: how much to take   nitroGLYCERIN 0.4 MG SL tablet Commonly known as: NITROSTAT Place 1 tablet (0.4 mg total) under the tongue every 5 (five) minutes as  needed for chest pain.   NovoLOG FlexPen 100 UNIT/ML FlexPen Generic drug: insulin aspart Inject 3 Units into the skin 3 (three) times daily with meals. What changed: how much to take   OLANZapine 10 MG tablet Commonly known as: ZYPREXA TAKE 1 TABLET BY MOUTH EVERYDAY AT BEDTIME   omeprazole 20 MG capsule Commonly known as: PRILOSEC Take 20 mg by mouth daily.   pravastatin 20 MG tablet Commonly known as: PRAVACHOL Take 20 mg by mouth daily. 03/09/2022 Patient states is taking only Pravastatin.   QUEtiapine 400 MG tablet Commonly known as: SEROQUEL Take 800 mg by mouth at bedtime.        Allergies:  Allergies  Allergen Reactions   Lithium Other (See Comments)    Spinal fluid built up in brain   Dulaglutide Nausea And Vomiting and Other (See  Comments)   Penicillin G Other (See Comments)   Penicillins Other (See Comments)    UNKNOWN CHILDHOOD REACTION    Past Medical History, Surgical history, Social history, and Family History were reviewed and updated.  Review of Systems: Review of Systems  Constitutional:  Positive for malaise/fatigue.  HENT: Negative.    Eyes: Negative.   Respiratory: Negative.    Cardiovascular:  Positive for leg swelling.  Gastrointestinal:  Positive for nausea.  Genitourinary: Negative.   Musculoskeletal:  Positive for joint pain and myalgias.  Skin: Negative.   Neurological:  Positive for tingling.  Endo/Heme/Allergies: Negative.   Psychiatric/Behavioral: Negative.       Physical Exam:  height is '5\' 2"'$  (1.575 m) and weight is 180 lb (81.6 kg). Her oral temperature is 98 F (36.7 C). Her blood pressure is 140/66 (abnormal) and her pulse is 106 (abnormal). Her respiration is 17 and oxygen saturation is 98%.   Wt Readings from Last 3 Encounters:  08/05/22 180 lb (81.6 kg)  07/30/22 187 lb (84.8 kg)  07/15/22 189 lb (85.7 kg)    Physical Exam Vitals reviewed.  HENT:     Head: Normocephalic and atraumatic.  Eyes:     Pupils: Pupils are equal, round, and reactive to light.  Cardiovascular:     Rate and Rhythm: Normal rate and regular rhythm.     Heart sounds: Normal heart sounds.  Pulmonary:     Effort: Pulmonary effort is normal.     Breath sounds: Normal breath sounds.  Abdominal:     General: Bowel sounds are normal.     Palpations: Abdomen is soft.  Musculoskeletal:        General: No tenderness or deformity. Normal range of motion.     Cervical back: Normal range of motion.  Lymphadenopathy:     Cervical: No cervical adenopathy.  Skin:    General: Skin is warm and dry.     Findings: No erythema or rash.  Neurological:     Mental Status: She is alert and oriented to person, place, and time.  Psychiatric:        Behavior: Behavior normal.        Thought Content: Thought  content normal.        Judgment: Judgment normal.    Lab Results  Component Value Date   WBC 5.0 08/05/2022   HGB 12.1 08/05/2022   HCT 35.7 (L) 08/05/2022   MCV 100.6 (H) 08/05/2022   PLT 143 (L) 08/05/2022   Lab Results  Component Value Date   FERRITIN 38 05/06/2022   IRON 70 05/06/2022   TIBC 434 05/06/2022   UIBC 364 05/06/2022  IRONPCTSAT 16 05/06/2022   Lab Results  Component Value Date   RETICCTPCT 4.0 (H) 08/05/2022   RBC 3.55 (L) 08/05/2022   RBC 3.53 (L) 08/05/2022   No results found for: "KPAFRELGTCHN", "LAMBDASER", "KAPLAMBRATIO" No results found for: "IGGSERUM", "IGA", "IGMSERUM" No results found for: "TOTALPROTELP", "ALBUMINELP", "A1GS", "A2GS", "BETS", "BETA2SER", "GAMS", "MSPIKE", "SPEI"   Chemistry      Component Value Date/Time   NA 139 08/05/2022 0846   K 3.5 08/05/2022 0846   CL 106 08/05/2022 0846   CO2 24 08/05/2022 0846   BUN 13 08/05/2022 0846   CREATININE 0.82 08/05/2022 0846      Component Value Date/Time   CALCIUM 9.5 08/05/2022 0846   ALKPHOS 70 08/05/2022 0846   AST 29 08/05/2022 0846   ALT 25 08/05/2022 0846   BILITOT 0.4 08/05/2022 0846       Impression and Plan: Ms. Ariana Herrera is a very pleasant is a very pleasant 56 yo caucasian female with recurrent ductal carcinoma of the left breast.   We now have her on Trodelvy.  Hopefully, we will see a response.  I will plan for a PET scan after this third cycle of treatment.  She really is showing a lot of resilience.  I just hope that she will be able to stop smoking.  I know this will definitely help her out.  We will plan for a PET scan in about 3 or 4 weeks.  I may give her an extra week off.  We will try to coordinate treatments around the upcoming holidays.       Volanda Napoleon, MD 10/12/20239:37 AM

## 2022-08-05 NOTE — Patient Instructions (Signed)
[facility]  Discharge Instructions: Thank you for choosing [facility] to provide your oncology and hematology care.   If you have a lab appointment with the Cancer Center, please go directly to the Cancer Center and check in at the registration area.  Wear comfortable clothing and clothing appropriate for easy access to any Portacath or PICC line.   We strive to give you quality time with your provider. You may need to reschedule your appointment if you arrive late (15 or more minutes).  Arriving late affects you and other patients whose appointments are after yours.  Also, if you miss three or more appointments without notifying the office, you may be dismissed from the clinic at the provider's discretion.      For prescription refill requests, have your pharmacy contact our office and allow 72 hours for refills to be completed.    Today you received the following chemotherapy and/or immunotherapy agents Trodelvy.      To help prevent nausea and vomiting after your treatment, we encourage you to take your nausea medication as directed.  BELOW ARE SYMPTOMS THAT SHOULD BE REPORTED IMMEDIATELY: *FEVER GREATER THAN 100.4 F (38 C) OR HIGHER *CHILLS OR SWEATING *NAUSEA AND VOMITING THAT IS NOT CONTROLLED WITH YOUR NAUSEA MEDICATION *UNUSUAL SHORTNESS OF BREATH *UNUSUAL BRUISING OR BLEEDING *URINARY PROBLEMS (pain or burning when urinating, or frequent urination) *BOWEL PROBLEMS (unusual diarrhea, constipation, pain near the anus) TENDERNESS IN MOUTH AND THROAT WITH OR WITHOUT PRESENCE OF ULCERS (sore throat, sores in mouth, or a toothache) UNUSUAL RASH, SWELLING OR PAIN  UNUSUAL VAGINAL DISCHARGE OR ITCHING   Items with * indicate a potential emergency and should be followed up as soon as possible or go to the Emergency Department if any problems should occur.  Please show the CHEMOTHERAPY ALERT CARD or IMMUNOTHERAPY ALERT CARD at check-in to the  Emergency Department and triage nurse. Should you have questions after your visit or need to cancel or reschedule your appointment, please contact [facility]  [phone] and follow the prompts.  Office hours are 8:00 a.m. to 4:30 p.m. Monday - Friday. Please note that voicemails left after 4:00 p.m. may not be returned until the following business day.  We are closed weekends and major holidays. You have access to a nurse at all times for urgent questions. Please call the main number to the clinic [phone] and follow the prompts.  For any non-urgent questions, you may also contact your provider using MyChart. We now offer e-Visits for anyone 18 and older to request care online for non-urgent symptoms. For details visit [email or web address].   Also download the MyChart app! Go to the app store, search "MyChart", open the app, select [facility], and log in with your MyChart username and password.  Masks are optional in the cancer centers. If you would like for your care team to wear a mask while they are taking care of you, please let them know. You may have one support person who is at least 54 years old accompany you for your appointments. 

## 2022-08-05 NOTE — Progress Notes (Signed)
Patient is doing well today. She will need a PET prior to her next appointment. Scheduled for 09/01/2022.  Patient is aware of PET appointment including date, time, and location. The following prep is reviewed with patient and confirmed with teachback: - arrive 30 minutes before appointment time - NPO except water for 6h before scan. No candy, no gum - hold any diabetic medication the morning of the scan - have a low carb dinner the night prior Radiology Information sheet also reviewed and given to patient for reinforcement of education.   Oncology Nurse Navigator Documentation     08/05/2022   10:00 AM  Oncology Nurse Navigator Flowsheets  Navigator Follow Up Date: 09/01/2022  Navigator Follow Up Reason: Scan Review  Navigator Location [facility]  Navigator Encounter Type Treatment;Appt/Treatment Plan Review  Patient Visit Type MedOnc  Treatment Phase Active Tx  Barriers/Navigation Needs Coordination of Care;Education  Education Other  Interventions Coordination of Care;Education;Psycho-Social Support  Acuity Level 2-Minimal Needs (1-2 Barriers Identified)  Coordination of Care Radiology  Education Method Verbal;Written;Teach-back  Support Groups/Services Friends and Family  Time Spent with Patient 61

## 2022-08-06 ENCOUNTER — Other Ambulatory Visit: Payer: Self-pay

## 2022-08-06 LAB — CANCER ANTIGEN 27.29: CA 27.29: 44.4 U/mL — ABNORMAL HIGH (ref 0.0–38.6)

## 2022-08-07 ENCOUNTER — Other Ambulatory Visit: Payer: Self-pay

## 2022-08-12 ENCOUNTER — Inpatient Hospital Stay: Payer: Medicaid Other

## 2022-08-12 ENCOUNTER — Other Ambulatory Visit: Payer: Self-pay

## 2022-08-12 VITALS — BP 132/64 | HR 98 | Temp 98.0°F | Resp 16

## 2022-08-12 DIAGNOSIS — C50011 Malignant neoplasm of nipple and areola, right female breast: Secondary | ICD-10-CM

## 2022-08-12 DIAGNOSIS — Z5112 Encounter for antineoplastic immunotherapy: Secondary | ICD-10-CM | POA: Diagnosis not present

## 2022-08-12 LAB — CMP (CANCER CENTER ONLY)
ALT: 10 U/L (ref 0–44)
AST: 10 U/L — ABNORMAL LOW (ref 15–41)
Albumin: 3.8 g/dL (ref 3.5–5.0)
Alkaline Phosphatase: 72 U/L (ref 38–126)
Anion gap: 8 (ref 5–15)
BUN: 10 mg/dL (ref 6–20)
CO2: 25 mmol/L (ref 22–32)
Calcium: 9.1 mg/dL (ref 8.9–10.3)
Chloride: 104 mmol/L (ref 98–111)
Creatinine: 0.7 mg/dL (ref 0.44–1.00)
GFR, Estimated: >60 mL/min (ref >60)
Glucose, Bld: 119 mg/dL — ABNORMAL HIGH (ref 70–99)
Potassium: 3.9 mmol/L (ref 3.5–5.1)
Sodium: 137 mmol/L (ref 135–145)
Total Bilirubin: 0.3 mg/dL (ref 0.3–1.2)
Total Protein: 6.3 g/dL — ABNORMAL LOW (ref 6.5–8.1)

## 2022-08-12 LAB — LIPID PANEL
Chol/HDL Ratio: 3.8 ratio (ref 0.0–4.4)
Cholesterol, Total: 185 mg/dL (ref 100–199)
HDL: 49 mg/dL (ref >39)
LDL Chol Calc (NIH): 96 mg/dL (ref 0–99)
Triglycerides: 239 mg/dL — ABNORMAL HIGH (ref 0–149)
VLDL Cholesterol Cal: 40 mg/dL (ref 5–40)

## 2022-08-12 LAB — LACTATE DEHYDROGENASE: LDH: 203 U/L — ABNORMAL HIGH (ref 98–192)

## 2022-08-12 LAB — CBC WITH DIFFERENTIAL (CANCER CENTER ONLY)
Abs Immature Granulocytes: 0.03 10*3/uL (ref 0.00–0.07)
Basophils Absolute: 0 10*3/uL (ref 0.0–0.1)
Basophils Relative: 0 %
Eosinophils Absolute: 0 10*3/uL (ref 0.0–0.5)
Eosinophils Relative: 0 %
HCT: 33.5 % — ABNORMAL LOW (ref 36.0–46.0)
Hemoglobin: 11.1 g/dL — ABNORMAL LOW (ref 12.0–15.0)
Immature Granulocytes: 1 %
Lymphocytes Relative: 43 %
Lymphs Abs: 1.1 10*3/uL (ref 0.7–4.0)
MCH: 34.2 pg — ABNORMAL HIGH (ref 26.0–34.0)
MCHC: 33.1 g/dL (ref 30.0–36.0)
MCV: 103.1 fL — ABNORMAL HIGH (ref 80.0–100.0)
Monocytes Absolute: 0.3 10*3/uL (ref 0.1–1.0)
Monocytes Relative: 12 %
Neutro Abs: 1.2 10*3/uL — ABNORMAL LOW (ref 1.7–7.7)
Neutrophils Relative %: 44 %
Platelet Count: 149 10*3/uL — ABNORMAL LOW (ref 150–400)
RBC: 3.25 MIL/uL — ABNORMAL LOW (ref 3.87–5.11)
RDW: 16.3 % — ABNORMAL HIGH (ref 11.5–15.5)
Smear Review: NORMAL
WBC Count: 2.6 10*3/uL — ABNORMAL LOW (ref 4.0–10.5)
nRBC: 0 % (ref 0.0–0.2)

## 2022-08-12 MED ORDER — PALONOSETRON HCL INJECTION 0.25 MG/5ML
0.2500 mg | Freq: Once | INTRAVENOUS | Status: AC
  Administered 2022-08-12: 0.25 mg via INTRAVENOUS
  Filled 2022-08-12: qty 5

## 2022-08-12 MED ORDER — FAMOTIDINE IN NACL 20-0.9 MG/50ML-% IV SOLN
20.0000 mg | Freq: Once | INTRAVENOUS | Status: AC
  Administered 2022-08-12: 20 mg via INTRAVENOUS
  Filled 2022-08-12: qty 50

## 2022-08-12 MED ORDER — HEPARIN SOD (PORK) LOCK FLUSH 100 UNIT/ML IV SOLN
500.0000 [IU] | Freq: Once | INTRAVENOUS | Status: AC | PRN
  Administered 2022-08-12: 500 [IU]

## 2022-08-12 MED ORDER — ATROPINE SULFATE 1 MG/ML IV SOLN: 0.5000 mg | Freq: Once | INTRAVENOUS | Status: DC | PRN

## 2022-08-12 MED ORDER — SODIUM CHLORIDE 0.9 % IV SOLN
600.0000 mg | Freq: Once | INTRAVENOUS | Status: AC
  Administered 2022-08-12: 600 mg via INTRAVENOUS
  Filled 2022-08-12: qty 60

## 2022-08-12 MED ORDER — DIPHENHYDRAMINE HCL 50 MG/ML IJ SOLN
25.0000 mg | Freq: Once | INTRAMUSCULAR | Status: AC
  Administered 2022-08-12: 25 mg via INTRAVENOUS
  Filled 2022-08-12: qty 1

## 2022-08-12 MED ORDER — SODIUM CHLORIDE 0.9% FLUSH
10.0000 mL | INTRAVENOUS | Status: DC | PRN
  Administered 2022-08-12: 10 mL

## 2022-08-12 MED ORDER — ACETAMINOPHEN 325 MG PO TABS
650.0000 mg | ORAL_TABLET | Freq: Once | ORAL | Status: AC
  Administered 2022-08-12: 650 mg via ORAL
  Filled 2022-08-12: qty 2

## 2022-08-12 MED ORDER — SODIUM CHLORIDE 0.9 % IV SOLN: Freq: Once | INTRAVENOUS | Status: AC

## 2022-08-12 MED ORDER — SODIUM CHLORIDE 0.9 % IV SOLN
10.0000 mg | Freq: Once | INTRAVENOUS | Status: AC
  Administered 2022-08-12: 10 mg via INTRAVENOUS
  Filled 2022-08-12: qty 10

## 2022-08-12 MED ORDER — SODIUM CHLORIDE 0.9 % IV SOLN
150.0000 mg | Freq: Once | INTRAVENOUS | Status: AC
  Administered 2022-08-12: 150 mg via INTRAVENOUS
  Filled 2022-08-12: qty 150

## 2022-08-12 MED ORDER — SODIUM CHLORIDE 0.9% FLUSH
10.0000 mL | Freq: Once | INTRAVENOUS | Status: AC
  Administered 2022-08-12: 10 mL

## 2022-08-12 NOTE — Patient Instructions (Signed)

## 2022-08-12 NOTE — Progress Notes (Signed)
Ok to treat with ANC of 1.2 per Dr Marin Olp. dph

## 2022-08-12 NOTE — Patient Instructions (Signed)
Sacituzumab Govitecan Injection What is this medication? SACITUZUMAB GOVITECAN (SAK i TOOZ ue mab GOE vi TEE kan) treats breast cancer. It may also be used to treat bladder cancer and kidney cancer. It works by blocking a protein that causes cancer cells to grow and multiply. This helps to slow or stop the spread of cancer cells. This medicine may be used for other purposes; ask your health care provider or pharmacist if you have questions. COMMON BRAND NAME(S): TRODELVY What should I tell my care team before I take this medication? They need to know if you have any of these conditions: Carry the UGT1A1*28 gene Infection Liver disease An unusual or allergic reaction to sacituzumab govitecan, other medications, foods, dyes, or preservatives Pregnant or trying to get pregnant Breast-feeding How should I use this medication? This medication is injected into a vein. It is given by your care team in a hospital or clinic setting. Talk to your care team about the use of this medication in children. Special care may be needed. Overdosage: If you think you have taken too much of this medicine contact a poison control center or emergency room at once. NOTE: This medicine is only for you. Do not share this medicine with others. What if I miss a dose? Keep appointments for follow-up doses. It is important not to miss your dose. Call your care team if you are unable to keep an appointment. What may interact with this medication? This medication may affect how other medications work, and other medications may affect the way this medication works. Talk with your care team about all of the medications you take. They may suggest changes to your treatment plan to lower the risk of side effects and to make sure your medications work as intended. This list may not describe all possible interactions. Give your health care provider a list of all the medicines, herbs, non-prescription drugs, or dietary supplements  you use. Also tell them if you smoke, drink alcohol, or use illegal drugs. Some items may interact with your medicine. What should I watch for while using this medication? This medication may make you feel generally unwell. This is not uncommon as chemotherapy can affect healthy cells as well as cancer cells. Report any side effects. Continue your course of treatment even though you feel ill unless your care team tells you to stop. You may need blood work while you are taking this medication. Certain genetic factors may decrease the safety of this medication. Your care team may use genetic tests to determine treatment. This medication can cause serious allergic reactions. To reduce your risk, your care team may give you other medications to take before receiving this one. Be sure to follow the directions from your care team. Check with your care team if you have severe diarrhea, nausea, and vomiting, or if you sweat a lot. The loss of too much body fluid may make it dangerous for you to take this medication. Talk to your care team if you wish to become pregnant or think you might be pregnant. This medication can cause serious birth defects if taken during pregnancy or if you get pregnant within 6 months after stopping treatment. A negative pregnancy test is required before starting this medication. A reliable form of contraception is recommended while taking this medication and for 6 months after stopping treatment. Talk to your care team about reliable forms of contraception. Use a condom during sex and for 3 months after stopping treatment. Tell your care team right  away if you think your partner might be pregnant. This medication can cause serious birth defects. Do not breast-feed while taking this medication and for 1 month after stopping therapy. This medication may cause infertility. Talk to your care team if you are concerned about your fertility. This medication may increase your risk of getting  an infection. Call your care team for advice if you get a fever, chills, sore throat, or other symptoms of a cold or flu. Do not treat yourself. Try to avoid being around people who are sick. Avoid taking medications that contain aspirin, acetaminophen, ibuprofen, naproxen, or ketoprofen unless instructed by your care team. These medications may hide a fever. This medication may increase blood sugar. The risk may be higher in patients who already have diabetes. Ask your care team what you can do to lower your risk of diabetes while taking this medication. What side effects may I notice from receiving this medication? Side effects that you should report to your care team as soon as possible: Allergic reactions--skin rash, itching, hives, swelling of the face, lips, tongue, or throat Infection--fever, chills, cough, or sore throat Infusion reactions--chest pain, shortness of breath or trouble breathing, feeling faint or lightheaded Low red blood cell level--unusual weakness or fatigue, dizziness, headache, trouble breathing Severe or prolonged diarrhea Side effects that usually do not require medical attention (report these to your care team if they continue or are bothersome): Constipation Diarrhea Fatigue Hair loss Loss of appetite Nausea Vomiting This list may not describe all possible side effects. Call your doctor for medical advice about side effects. You may report side effects to FDA at 1-800-FDA-1088. Where should I keep my medication? This medication is given in a hospital or clinic. It will not be stored at home. NOTE: This sheet is a summary. It may not cover all possible information. If you have questions about this medicine, talk to your doctor, pharmacist, or health care provider.  2023 Elsevier/Gold Standard (2022-02-23 00:00:00)

## 2022-08-13 LAB — CANCER ANTIGEN 27.29: CA 27.29: 26.1 U/mL (ref 0.0–38.6)

## 2022-08-18 ENCOUNTER — Encounter: Payer: Self-pay | Admitting: Hematology & Oncology

## 2022-08-18 ENCOUNTER — Other Ambulatory Visit: Payer: Self-pay | Admitting: Hematology & Oncology

## 2022-08-18 ENCOUNTER — Other Ambulatory Visit (HOSPITAL_BASED_OUTPATIENT_CLINIC_OR_DEPARTMENT_OTHER): Payer: Self-pay

## 2022-08-18 DIAGNOSIS — M25512 Pain in left shoulder: Secondary | ICD-10-CM

## 2022-08-18 DIAGNOSIS — C50011 Malignant neoplasm of nipple and areola, right female breast: Secondary | ICD-10-CM

## 2022-08-18 MED ORDER — HYDROCODONE-ACETAMINOPHEN 7.5-325 MG PO TABS
1.0000 | ORAL_TABLET | ORAL | 0 refills | Status: DC | PRN
  Filled 2022-08-18 – 2022-08-19 (×2): qty 90, 15d supply, fill #0

## 2022-08-19 ENCOUNTER — Other Ambulatory Visit: Payer: Self-pay | Admitting: *Deleted

## 2022-08-19 ENCOUNTER — Other Ambulatory Visit (HOSPITAL_BASED_OUTPATIENT_CLINIC_OR_DEPARTMENT_OTHER): Payer: Self-pay

## 2022-08-19 DIAGNOSIS — E785 Hyperlipidemia, unspecified: Secondary | ICD-10-CM

## 2022-08-31 ENCOUNTER — Other Ambulatory Visit: Payer: Self-pay

## 2022-09-01 ENCOUNTER — Other Ambulatory Visit: Payer: Self-pay | Admitting: Hematology & Oncology

## 2022-09-01 ENCOUNTER — Encounter (HOSPITAL_COMMUNITY)
Admission: RE | Admit: 2022-09-01 | Discharge: 2022-09-01 | Disposition: A | Discharge status: Home / Self Care | Payer: Medicaid Other | Source: Ambulatory Visit | Attending: Hematology & Oncology | Admitting: Hematology & Oncology

## 2022-09-01 ENCOUNTER — Other Ambulatory Visit (HOSPITAL_BASED_OUTPATIENT_CLINIC_OR_DEPARTMENT_OTHER): Payer: Self-pay

## 2022-09-01 ENCOUNTER — Encounter: Payer: Self-pay | Admitting: *Deleted

## 2022-09-01 DIAGNOSIS — C50011 Malignant neoplasm of nipple and areola, right female breast: Secondary | ICD-10-CM

## 2022-09-01 DIAGNOSIS — M25512 Pain in left shoulder: Secondary | ICD-10-CM

## 2022-09-01 LAB — GLUCOSE, CAPILLARY: Glucose-Capillary: 153 mg/dL — ABNORMAL HIGH (ref 70–99)

## 2022-09-01 MED ORDER — FLUDEOXYGLUCOSE F - 18 (FDG) INJECTION
9.0000 | Freq: Once | INTRAVENOUS | Status: AC
  Administered 2022-09-01: 8.96 via INTRAVENOUS

## 2022-09-01 MED ORDER — HYDROCODONE-ACETAMINOPHEN 7.5-325 MG PO TABS
1.0000 | ORAL_TABLET | ORAL | 0 refills | Status: DC | PRN
  Filled 2022-09-01: qty 90, 15d supply, fill #0

## 2022-09-03 ENCOUNTER — Encounter: Payer: Self-pay | Admitting: *Deleted

## 2022-09-03 NOTE — Progress Notes (Signed)
Reviewed PET results which are negative for recurrence or met disease.   Oncology Nurse Navigator Documentation     09/03/2022    2:15 PM  Oncology Nurse Navigator Flowsheets  Navigator Follow Up Date: 09/08/2022  Navigator Follow Up Reason: Follow-up Appointment  Navigator Location [facility]  Navigator Encounter Type Scan Review  Patient Visit Type MedOnc  Treatment Phase Active Tx  Barriers/Navigation Needs Coordination of Care;Education  Interventions None Required  Acuity Level 2-Minimal Needs (1-2 Barriers Identified)  Support Groups/Services Friends and Family  Time Spent with Patient 15

## 2022-09-08 ENCOUNTER — Inpatient Hospital Stay: Payer: Medicaid Other | Attending: Hematology & Oncology | Admitting: Hematology & Oncology

## 2022-09-08 ENCOUNTER — Inpatient Hospital Stay: Payer: Medicaid Other | Admitting: Licensed Clinical Social Worker

## 2022-09-08 ENCOUNTER — Inpatient Hospital Stay: Payer: Medicaid Other

## 2022-09-08 ENCOUNTER — Encounter: Payer: Self-pay | Admitting: Hematology & Oncology

## 2022-09-08 ENCOUNTER — Encounter: Payer: Self-pay | Admitting: *Deleted

## 2022-09-08 VITALS — BP 150/60 | HR 99 | Resp 16

## 2022-09-08 DIAGNOSIS — C50912 Malignant neoplasm of unspecified site of left female breast: Secondary | ICD-10-CM | POA: Insufficient documentation

## 2022-09-08 DIAGNOSIS — C50011 Malignant neoplasm of nipple and areola, right female breast: Secondary | ICD-10-CM | POA: Diagnosis not present

## 2022-09-08 DIAGNOSIS — Z171 Estrogen receptor negative status [ER-]: Secondary | ICD-10-CM | POA: Insufficient documentation

## 2022-09-08 DIAGNOSIS — Z5112 Encounter for antineoplastic immunotherapy: Secondary | ICD-10-CM | POA: Diagnosis present

## 2022-09-08 DIAGNOSIS — Z79899 Other long term (current) drug therapy: Secondary | ICD-10-CM | POA: Insufficient documentation

## 2022-09-08 DIAGNOSIS — Z5189 Encounter for other specified aftercare: Secondary | ICD-10-CM | POA: Insufficient documentation

## 2022-09-08 DIAGNOSIS — Z95828 Presence of other vascular implants and grafts: Secondary | ICD-10-CM

## 2022-09-08 LAB — MAGNESIUM: Magnesium: 1.1 mg/dL — ABNORMAL LOW (ref 1.7–2.4)

## 2022-09-08 LAB — CBC WITH DIFFERENTIAL (CANCER CENTER ONLY)
Abs Immature Granulocytes: 0.03 10*3/uL (ref 0.00–0.07)
Basophils Absolute: 0 10*3/uL (ref 0.0–0.1)
Basophils Relative: 1 %
Eosinophils Absolute: 0 10*3/uL (ref 0.0–0.5)
Eosinophils Relative: 0 %
HCT: 36.8 % (ref 36.0–46.0)
Hemoglobin: 12.1 g/dL (ref 12.0–15.0)
Immature Granulocytes: 1 %
Lymphocytes Relative: 26 %
Lymphs Abs: 1.3 10*3/uL (ref 0.7–4.0)
MCH: 33.7 pg (ref 26.0–34.0)
MCHC: 32.9 g/dL (ref 30.0–36.0)
MCV: 102.5 fL — ABNORMAL HIGH (ref 80.0–100.0)
Monocytes Absolute: 0.5 10*3/uL (ref 0.1–1.0)
Monocytes Relative: 10 %
Neutro Abs: 3.2 10*3/uL (ref 1.7–7.7)
Neutrophils Relative %: 62 %
Platelet Count: 204 10*3/uL (ref 150–400)
RBC: 3.59 MIL/uL — ABNORMAL LOW (ref 3.87–5.11)
RDW: 15.1 % (ref 11.5–15.5)
WBC Count: 5 10*3/uL (ref 4.0–10.5)
nRBC: 0 % (ref 0.0–0.2)

## 2022-09-08 LAB — CMP (CANCER CENTER ONLY)
ALT: 7 U/L (ref 0–44)
AST: 10 U/L — ABNORMAL LOW (ref 15–41)
Albumin: 3.9 g/dL (ref 3.5–5.0)
Alkaline Phosphatase: 60 U/L (ref 38–126)
Anion gap: 7 (ref 5–15)
BUN: 15 mg/dL (ref 6–20)
CO2: 28 mmol/L (ref 22–32)
Calcium: 9.5 mg/dL (ref 8.9–10.3)
Chloride: 103 mmol/L (ref 98–111)
Creatinine: 0.77 mg/dL (ref 0.44–1.00)
GFR, Estimated: >60 mL/min (ref >60)
Glucose, Bld: 182 mg/dL — ABNORMAL HIGH (ref 70–99)
Potassium: 4.3 mmol/L (ref 3.5–5.1)
Sodium: 138 mmol/L (ref 135–145)
Total Bilirubin: 0.3 mg/dL (ref 0.3–1.2)
Total Protein: 6.6 g/dL (ref 6.5–8.1)

## 2022-09-08 LAB — PHOSPHORUS: Phosphorus: 3.5 mg/dL (ref 2.5–4.6)

## 2022-09-08 MED ORDER — ATROPINE SULFATE 1 MG/ML IV SOLN: 0.5000 mg | Freq: Once | INTRAVENOUS | Status: DC | PRN

## 2022-09-08 MED ORDER — SODIUM CHLORIDE 0.9 % IV SOLN
600.0000 mg | Freq: Once | INTRAVENOUS | Status: AC
  Administered 2022-09-08: 600 mg via INTRAVENOUS
  Filled 2022-09-08: qty 60

## 2022-09-08 MED ORDER — FAMOTIDINE IN NACL 20-0.9 MG/50ML-% IV SOLN
20.0000 mg | Freq: Once | INTRAVENOUS | Status: AC
  Administered 2022-09-08: 20 mg via INTRAVENOUS
  Filled 2022-09-08: qty 50

## 2022-09-08 MED ORDER — ALTEPLASE 2 MG IJ SOLR: 2.0000 mg | Freq: Once | INTRAMUSCULAR | Status: DC | PRN

## 2022-09-08 MED ORDER — MAGNESIUM SULFATE 2 GM/50ML IV SOLN
2.0000 g | Freq: Once | INTRAVENOUS | Status: AC
  Administered 2022-09-08: 2 g via INTRAVENOUS
  Filled 2022-09-08: qty 50

## 2022-09-08 MED ORDER — SODIUM CHLORIDE 0.9% FLUSH
10.0000 mL | INTRAVENOUS | Status: DC | PRN
  Administered 2022-09-08: 10 mL

## 2022-09-08 MED ORDER — ACETAMINOPHEN 325 MG PO TABS
650.0000 mg | ORAL_TABLET | Freq: Once | ORAL | Status: AC
  Administered 2022-09-08: 650 mg via ORAL
  Filled 2022-09-08: qty 2

## 2022-09-08 MED ORDER — ALTEPLASE 2 MG IJ SOLR
2.0000 mg | Freq: Once | INTRAMUSCULAR | Status: AC | PRN
  Administered 2022-09-08: 2 mg
  Filled 2022-09-08: qty 2

## 2022-09-08 MED ORDER — PALONOSETRON HCL INJECTION 0.25 MG/5ML
0.2500 mg | Freq: Once | INTRAVENOUS | Status: AC
  Administered 2022-09-08: 0.25 mg via INTRAVENOUS
  Filled 2022-09-08: qty 5

## 2022-09-08 MED ORDER — SODIUM CHLORIDE 0.9 % IV SOLN
150.0000 mg | Freq: Once | INTRAVENOUS | Status: AC
  Administered 2022-09-08: 150 mg via INTRAVENOUS
  Filled 2022-09-08: qty 150

## 2022-09-08 MED ORDER — DIPHENHYDRAMINE HCL 50 MG/ML IJ SOLN
25.0000 mg | Freq: Once | INTRAMUSCULAR | Status: AC
  Administered 2022-09-08: 25 mg via INTRAVENOUS
  Filled 2022-09-08: qty 1

## 2022-09-08 MED ORDER — HEPARIN SOD (PORK) LOCK FLUSH 100 UNIT/ML IV SOLN
500.0000 [IU] | Freq: Once | INTRAVENOUS | Status: AC | PRN
  Administered 2022-09-08: 500 [IU]

## 2022-09-08 MED ORDER — SODIUM CHLORIDE 0.9 % IV SOLN
10.0000 mg | Freq: Once | INTRAVENOUS | Status: AC
  Administered 2022-09-08: 10 mg via INTRAVENOUS
  Filled 2022-09-08: qty 10

## 2022-09-08 MED ORDER — SODIUM CHLORIDE 0.9 % IV SOLN: Freq: Once | INTRAVENOUS | Status: AC

## 2022-09-08 NOTE — Patient Instructions (Signed)

## 2022-09-08 NOTE — Progress Notes (Signed)
[facility] Work  Initial Assessment   Ariana Herrera is a 56 y.o. year old female presenting alone. Clinical Social Work was referred by nurse navigator for assessment of psychosocial needs.   SDOH (Social Determinants of Health) assessments performed: Yes SDOH Interventions    Flowsheet Row Clinical Support from 09/08/2022 in [facility] Interventions   Food Insecurity Interventions Other (Comment)  Housing Interventions Intervention Not Indicated  Transportation Interventions Intervention Not Indicated  Utilities Interventions Intervention Not Indicated  Financial Strain Interventions Financial Counselor       [place]: Food Insecurity Present (09/08/2022)  Housing: Low Risk  (09/08/2022)  Transportation Needs: No Transportation Needs (09/08/2022)  Utilities: Not At Risk (09/08/2022)  Financial Resource Strain: Medium Risk (09/08/2022)  Tobacco Use: High Risk (09/08/2022)     Distress Screen completed: No     No data to display            Family/Social Information:  Housing Arrangement: patient lives with husband, Ariana Herrera. Family members/support persons in your life? 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 yes Concerns about diagnosis and/or treatment: I'm not especially worried about anything related to my treatment. 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 Patient enjoys time with family/ friends Current coping skills/ strengths: Capable of independent living , Music therapist , General fund of knowledge , Motivation for treatment/growth , and Supportive family/friends     SUMMARY: Current SDOH Barriers:  Financial constraints related to treatment of cancer.  Clinical Social Work Clinical Goal(s):  Explore resources available.  Interventions: Discussed common feeling and emotions when being diagnosed with cancer, and the importance of support during treatment Informed patient of the support team roles and support services at [facility] Provided CSW contact information and encouraged patient to call with any questions or concerns Provided patient with information about [employer] and [employer].  Also provided a bag of food from the food pantry.  Referred patient for [employer].   Follow Up Plan: Patient will contact CSW with any support or resource needs Patient verbalizes understanding of plan: Yes    Rodman Pickle Mahalia Dykes, LCSW   Patient is participating in a Managed Medicaid Plan:  Yes

## 2022-09-08 NOTE — Progress Notes (Signed)
Cathflo placed at 0852. No blood return at 0920, 0950, 10:20, 10:40. Peripheral IV started per patient's request. Good blood return from the port noted at 1129. Will d/c the peripheral IV and continue Tx. through the port. OK to treat with MG level from today per Dr. Marin Olp.

## 2022-09-08 NOTE — Progress Notes (Signed)
Hematology and Oncology Follow Up Visit  Ariana Herrera [id number] 07-26-1966 56 y.o. 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  She actually looks quite good.  She has done well with the Trodelvy.  We did do a PET scan on her.  The PET scan was done on 09/01/2022.  Thankfully, the PET scan did not show any evidence of active malignancy.  I must say that I am quite surprised by this.  She has done incredibly well.  She has responded as I expected.  Her last CA 27.29 was down to 26.  Her bigger issue is the smoking.  She is still smoking quite a bit.  Hopefully, she will decrease this.  There is still a lot of stress as she is under at home.  I think that we can probably ease up on the Trodelvy.  I would like to almost put her into a "maintenance" type of program.  I think this would be reasonable at this point.  She has had a good appetite.  She has had no obvious problems with bowels or bladder.  She has had no edema.  She has had no mouth sores.  She has had no cough or shortness of breath.  Overall, I would say her performance status is probably ECOG 1.    Medications:  Allergies as of 09/08/2022       Reactions   Lithium Other (See Comments)   Spinal fluid built up in brain   Dulaglutide Nausea And Vomiting, Other (See Comments)   Penicillin G Other (See Comments)   Penicillins Other (See Comments)   UNKNOWN CHILDHOOD REACTION        Medication List        Accurate as of September 08, 2022  9:23 AM. If you have any questions, ask your nurse or doctor.          Accu-Chek Guide test strip Generic drug: glucose blood 3 (three) times daily.    albuterol 108 (90 Base) MCG/ACT inhaler Commonly known as: VENTOLIN HFA Inhale 2 puffs into the lungs every 6 (six) hours as needed for wheezing or shortness of breath.   B-D UF III MINI PEN NEEDLES 31G X 5 MM Misc Generic drug: Insulin Pen Needle Use to administer insulin 4 times daily   fenofibrate 145 MG tablet Commonly known as: TRICOR Take 145 mg by mouth daily.   FreeStyle Libre 2 Sensor Misc   HYDROcodone-acetaminophen 7.5-325 MG tablet Commonly known as: Norco Take 1 tablet by mouth every 4 (four) hours as needed for moderate pain.   insulin glargine 100 UNIT/ML injection Commonly known as: LANTUS Inject 25 Units into the skin at bedtime.   losartan 25 MG tablet Commonly known as: COZAAR Take 1 tablet (25 mg total) by mouth daily. What changed: how much to take   metFORMIN 1000 MG tablet Commonly known as: GLUCOPHAGE Take 1,000 mg by mouth 2 (two) times daily with a meal.   metoprolol succinate 25 MG 24 hr tablet Commonly known as: TOPROL-XL TAKE 1 AND 1/2 TABLETS BY MOUTH EVERY DAY What changed: how much to take   nitroGLYCERIN 0.4 MG SL tablet Commonly known as:  NITROSTAT Place 1 tablet (0.4 mg total) under the tongue every 5 (five) minutes as needed for chest pain.   NovoLOG FlexPen 100 UNIT/ML FlexPen Generic drug: insulin aspart Inject 3 Units into the skin 3 (three) times daily with meals. What changed: how much to take   OLANZapine 10 MG tablet Commonly known as: ZYPREXA TAKE 1 TABLET BY MOUTH EVERYDAY AT BEDTIME   omeprazole 20 MG capsule Commonly known as: PRILOSEC Take 20 mg by mouth daily.   pravastatin 20 MG tablet Commonly known as: PRAVACHOL Take 20 mg by mouth daily. 03/09/2022 Patient states is taking only Pravastatin.   QUEtiapine 400 MG tablet Commonly known as: SEROQUEL Take 800 mg by mouth at bedtime.        Allergies:  Allergies  Allergen Reactions   Lithium Other (See Comments)    Spinal fluid built up in brain    Dulaglutide Nausea And Vomiting and Other (See Comments)   Penicillin G Other (See Comments)   Penicillins Other (See Comments)    UNKNOWN CHILDHOOD REACTION    Past Medical History, Surgical history, Social history, and Family History were reviewed and updated.  Review of Systems: Review of Systems  Constitutional:  Positive for malaise/fatigue.  HENT: Negative.    Eyes: Negative.   Respiratory: Negative.    Cardiovascular:  Positive for leg swelling.  Gastrointestinal:  Positive for nausea.  Genitourinary: Negative.   Musculoskeletal:  Positive for joint pain and myalgias.  Skin: Negative.   Neurological:  Positive for tingling.  Endo/Heme/Allergies: Negative.   Psychiatric/Behavioral: Negative.       Physical Exam:  height is '5\' 2"'$  (1.575 m) (pended) and weight is 184 lb (83.5 kg) (pended). Her oral temperature is 98.3 F (36.8 C) (pended). Her blood pressure is 158/51 (abnormal, pended) and her pulse is 99 (pended). Her respiration is 20 (pended) and oxygen saturation is 100% (pended).   Wt Readings from Last 3 Encounters:  09/08/22 (P) 184 lb (83.5 kg)  08/05/22 180 lb (81.6 kg)  07/30/22 187 lb (84.8 kg)    Physical Exam Vitals reviewed.  HENT:     Head: Normocephalic and atraumatic.  Eyes:     Pupils: Pupils are equal, round, and reactive to light.  Cardiovascular:     Rate and Rhythm: Normal rate and regular rhythm.     Heart sounds: Normal heart sounds.  Pulmonary:     Effort: Pulmonary effort is normal.     Breath sounds: Normal breath sounds.  Abdominal:     General: Bowel sounds are normal.     Palpations: Abdomen is soft.  Musculoskeletal:        General: No tenderness or deformity. Normal range of motion.     Cervical back: Normal range of motion.  Lymphadenopathy:     Cervical: No cervical adenopathy.  Skin:    General: Skin is warm and dry.     Findings: No erythema or rash.  Neurological:     Mental Status: She is alert and oriented to  person, place, and time.  Psychiatric:        Behavior: Behavior normal.        Thought Content: Thought content normal.        Judgment: Judgment normal.    Lab Results  Component Value Date   WBC 5.0 09/08/2022   HGB 12.1 09/08/2022   HCT 36.8 09/08/2022   MCV 102.5 (H) 09/08/2022   PLT 204 09/08/2022   Lab Results  Component Value Date  FERRITIN 42 08/05/2022   IRON 57 08/05/2022   TIBC 360 08/05/2022   UIBC 303 08/05/2022   IRONPCTSAT 16 08/05/2022   Lab Results  Component Value Date   RETICCTPCT 4.0 (H) 08/05/2022   RBC 3.59 (L) 09/08/2022   No results found for: "KPAFRELGTCHN", "LAMBDASER", "KAPLAMBRATIO" No results found for: "IGGSERUM", "IGA", "IGMSERUM" No results found for: "TOTALPROTELP", "ALBUMINELP", "A1GS", "A2GS", "BETS", "BETA2SER", "GAMS", "MSPIKE", "SPEI"   Chemistry      Component Value Date/Time   NA 137 08/12/2022 0925   K 3.9 08/12/2022 0925   CL 104 08/12/2022 0925   CO2 25 08/12/2022 0925   BUN 10 08/12/2022 0925   CREATININE 0.70 08/12/2022 0925      Component Value Date/Time   CALCIUM 9.1 08/12/2022 0925   ALKPHOS 72 08/12/2022 0925   AST 10 (L) 08/12/2022 0925   ALT 10 08/12/2022 0925   BILITOT 0.3 08/12/2022 0925       Impression and Plan: Ariana Herrera is a very pleasant  56 yo caucasian female with recurrent ductal carcinoma of the left breast.   She is on [place].  She has had 3 cycles to date.  At this point, we are going to have her just have 1 day of treatment.  We will omit day #8 of therapy.  I just do not think that we need to be this aggressive.  I just do not want her to have cytopenias from the treatment.  We will treat her once a month.  If we do find that there is any evidence of progression, then we can certainly increase the frequency of treatment.  I am just happy that her quality of life is doing so well.  Again, the smoking might be an issue.  She is going to do her best to cut back on this.  We will plan to get  her back in 1 month.  We now have her on Trodelvy.  Hopefully, we will see a response.  I will plan for a PET scan after this third cycle of treatment.  She really is showing a lot of resilience.  I just hope that she will be able to stop smoking.  I know this will definitely help her out.  We will plan for a PET scan in about 3 or 4 weeks.  I may give her an extra week off.  We will try to coordinate treatments around the upcoming holidays.       Volanda Napoleon, MD 11/15/20239:23 AM

## 2022-09-08 NOTE — Patient Instructions (Signed)
[facility]  Discharge Instructions: Thank you for choosing [facility] to provide your oncology and hematology care.   If you have a lab appointment with the [facility], please go directly to the [facility] and check in at the registration area.  Wear comfortable clothing and clothing appropriate for easy access to any Portacath or PICC line.   We strive to give you quality time with your provider. You may need to reschedule your appointment if you arrive late (15 or more minutes).  Arriving late affects you and other patients whose appointments are after yours.  Also, if you miss three or more appointments without notifying the office, you may be dismissed from the clinic at the provider's discretion.      For prescription refill requests, have your pharmacy contact our office and allow 72 hours for refills to be completed.    Today you received the following chemotherapy and/or immunotherapy agents Trodelvy.      To help prevent nausea and vomiting after your treatment, we encourage you to take your nausea medication as directed.  BELOW ARE SYMPTOMS THAT SHOULD BE REPORTED IMMEDIATELY: *FEVER GREATER THAN 100.4 F (38 C) OR HIGHER *CHILLS OR SWEATING *NAUSEA AND VOMITING THAT IS NOT CONTROLLED WITH YOUR NAUSEA MEDICATION *UNUSUAL SHORTNESS OF BREATH *UNUSUAL BRUISING OR BLEEDING *URINARY PROBLEMS (pain or burning when urinating, or frequent urination) *BOWEL PROBLEMS (unusual diarrhea, constipation, pain near the anus) TENDERNESS IN MOUTH AND THROAT WITH OR WITHOUT PRESENCE OF ULCERS (sore throat, sores in mouth, or a toothache) UNUSUAL RASH, SWELLING OR PAIN  UNUSUAL VAGINAL DISCHARGE OR ITCHING   Items with * indicate a potential emergency and should be followed up as soon as possible or go to the Emergency Department if any problems should occur.  Please show the CHEMOTHERAPY ALERT CARD or IMMUNOTHERAPY ALERT CARD at check-in to the  Emergency Department and triage nurse. Should you have questions after your visit or need to cancel or reschedule your appointment, please contact [facility]  [phone] and follow the prompts.  Office hours are 8:00 a.m. to 4:30 p.m. Monday - Friday. Please note that voicemails left after 4:00 p.m. may not be returned until the following business day.  We are closed weekends and major holidays. You have access to a nurse at all times for urgent questions. Please call the main number to the clinic [phone] and follow the prompts.  For any non-urgent questions, you may also contact your provider using MyChart. We now offer e-Visits for anyone 23 and older to request care online for non-urgent symptoms. For details visit [email or web address].   Also download the MyChart app! Go to the app store, search "MyChart", open the app, select [facility], and log in with your MyChart username and password.  Masks are optional in the cancer centers. If you would like for your care team to wear a mask while they are taking care of you, please let them know. You may have one support person who is at least 54 years old accompany you for your appointments.

## 2022-09-09 NOTE — Progress Notes (Signed)
Patient has done very well with treatment and her most recent PET shows excellent response. She will now transition to maintenance treatment.   Oncology Nurse Navigator Documentation     09/08/2022   10:00 AM  Oncology Nurse Navigator Flowsheets  Navigator Follow Up Date: 10/06/2022  Navigator Follow Up Reason: Follow-up Appointment;Chemotherapy  Navigator Location [facility]  Navigator Encounter Type Treatment;Appt/Treatment Plan Review  Patient Visit Type MedOnc  Treatment Phase Active Tx  Barriers/Navigation Needs Coordination of Care;Education  Interventions Psycho-Social Support  Acuity Level 2-Minimal Needs (1-2 Barriers Identified)  Support Groups/Services Friends and Family  Time Spent with Patient 15

## 2022-09-10 MED ORDER — PRAVASTATIN SODIUM 40 MG PO TABS: 40.0000 mg | ORAL_TABLET | Freq: Every day | ORAL | 3 refills | Status: DC

## 2022-09-14 ENCOUNTER — Other Ambulatory Visit: Payer: Self-pay | Admitting: Hematology & Oncology

## 2022-09-14 ENCOUNTER — Other Ambulatory Visit (HOSPITAL_BASED_OUTPATIENT_CLINIC_OR_DEPARTMENT_OTHER): Payer: Self-pay

## 2022-09-14 ENCOUNTER — Encounter: Payer: Self-pay | Admitting: Hematology & Oncology

## 2022-09-14 DIAGNOSIS — M25512 Pain in left shoulder: Secondary | ICD-10-CM

## 2022-09-14 DIAGNOSIS — C50011 Malignant neoplasm of nipple and areola, right female breast: Secondary | ICD-10-CM

## 2022-09-14 MED ORDER — HYDROCODONE-ACETAMINOPHEN 7.5-325 MG PO TABS
1.0000 | ORAL_TABLET | ORAL | 0 refills | Status: DC | PRN
  Filled 2022-09-14: qty 90, 15d supply, fill #0

## 2022-09-15 ENCOUNTER — Other Ambulatory Visit: Payer: Self-pay

## 2022-09-15 ENCOUNTER — Inpatient Hospital Stay: Payer: Medicaid Other

## 2022-09-15 ENCOUNTER — Ambulatory Visit: Payer: Medicaid Other

## 2022-09-15 ENCOUNTER — Other Ambulatory Visit: Payer: Medicaid Other

## 2022-09-23 ENCOUNTER — Other Ambulatory Visit: Payer: Self-pay

## 2022-09-28 ENCOUNTER — Other Ambulatory Visit (HOSPITAL_BASED_OUTPATIENT_CLINIC_OR_DEPARTMENT_OTHER): Payer: Self-pay

## 2022-09-28 ENCOUNTER — Other Ambulatory Visit: Payer: Self-pay | Admitting: Hematology & Oncology

## 2022-09-28 DIAGNOSIS — M25512 Pain in left shoulder: Secondary | ICD-10-CM

## 2022-09-28 DIAGNOSIS — C50011 Malignant neoplasm of nipple and areola, right female breast: Secondary | ICD-10-CM

## 2022-09-28 MED ORDER — HYDROCODONE-ACETAMINOPHEN 7.5-325 MG PO TABS
1.0000 | ORAL_TABLET | ORAL | 0 refills | Status: DC | PRN
  Filled 2022-09-28: qty 90, 15d supply, fill #0

## 2022-10-06 ENCOUNTER — Other Ambulatory Visit: Payer: Self-pay

## 2022-10-06 ENCOUNTER — Inpatient Hospital Stay: Payer: Medicaid Other | Admitting: Licensed Clinical Social Worker

## 2022-10-06 ENCOUNTER — Inpatient Hospital Stay (HOSPITAL_BASED_OUTPATIENT_CLINIC_OR_DEPARTMENT_OTHER): Payer: Medicaid Other | Admitting: Hematology & Oncology

## 2022-10-06 ENCOUNTER — Encounter: Payer: Self-pay | Admitting: Hematology & Oncology

## 2022-10-06 ENCOUNTER — Inpatient Hospital Stay: Payer: Medicaid Other

## 2022-10-06 ENCOUNTER — Encounter: Payer: Self-pay | Admitting: *Deleted

## 2022-10-06 ENCOUNTER — Other Ambulatory Visit: Payer: Self-pay | Admitting: *Deleted

## 2022-10-06 ENCOUNTER — Inpatient Hospital Stay: Payer: Medicaid Other | Attending: Hematology & Oncology

## 2022-10-06 ENCOUNTER — Other Ambulatory Visit (HOSPITAL_BASED_OUTPATIENT_CLINIC_OR_DEPARTMENT_OTHER): Payer: Self-pay

## 2022-10-06 VITALS — BP 126/50 | HR 102 | Temp 98.3°F | Resp 18 | Ht 62.0 in | Wt 192.0 lb

## 2022-10-06 VITALS — HR 97

## 2022-10-06 DIAGNOSIS — C50011 Malignant neoplasm of nipple and areola, right female breast: Secondary | ICD-10-CM

## 2022-10-06 DIAGNOSIS — Z171 Estrogen receptor negative status [ER-]: Secondary | ICD-10-CM | POA: Insufficient documentation

## 2022-10-06 DIAGNOSIS — Z5112 Encounter for antineoplastic immunotherapy: Secondary | ICD-10-CM | POA: Insufficient documentation

## 2022-10-06 DIAGNOSIS — C50912 Malignant neoplasm of unspecified site of left female breast: Secondary | ICD-10-CM | POA: Diagnosis present

## 2022-10-06 DIAGNOSIS — Z79899 Other long term (current) drug therapy: Secondary | ICD-10-CM | POA: Diagnosis not present

## 2022-10-06 LAB — CBC WITH DIFFERENTIAL (CANCER CENTER ONLY)
Abs Immature Granulocytes: 0.02 10*3/uL (ref 0.00–0.07)
Basophils Absolute: 0 10*3/uL (ref 0.0–0.1)
Basophils Relative: 0 %
Eosinophils Absolute: 0 10*3/uL (ref 0.0–0.5)
Eosinophils Relative: 0 %
HCT: 34.9 % — ABNORMAL LOW (ref 36.0–46.0)
Hemoglobin: 11.5 g/dL — ABNORMAL LOW (ref 12.0–15.0)
Immature Granulocytes: 0 %
Lymphocytes Relative: 25 %
Lymphs Abs: 1.3 10*3/uL (ref 0.7–4.0)
MCH: 32.7 pg (ref 26.0–34.0)
MCHC: 33 g/dL (ref 30.0–36.0)
MCV: 99.1 fL (ref 80.0–100.0)
Monocytes Absolute: 0.5 10*3/uL (ref 0.1–1.0)
Monocytes Relative: 9 %
Neutro Abs: 3.5 10*3/uL (ref 1.7–7.7)
Neutrophils Relative %: 66 %
Platelet Count: 222 10*3/uL (ref 150–400)
RBC: 3.52 MIL/uL — ABNORMAL LOW (ref 3.87–5.11)
RDW: 14.1 % (ref 11.5–15.5)
WBC Count: 5.3 10*3/uL (ref 4.0–10.5)
nRBC: 0 % (ref 0.0–0.2)

## 2022-10-06 LAB — CMP (CANCER CENTER ONLY)
ALT: 7 U/L (ref 0–44)
AST: 11 U/L — ABNORMAL LOW (ref 15–41)
Albumin: 4 g/dL (ref 3.5–5.0)
Alkaline Phosphatase: 55 U/L (ref 38–126)
Anion gap: 8 (ref 5–15)
BUN: 16 mg/dL (ref 6–20)
CO2: 23 mmol/L (ref 22–32)
Calcium: 9 mg/dL (ref 8.9–10.3)
Chloride: 106 mmol/L (ref 98–111)
Creatinine: 0.92 mg/dL (ref 0.44–1.00)
GFR, Estimated: >60 mL/min (ref >60)
Glucose, Bld: 116 mg/dL — ABNORMAL HIGH (ref 70–99)
Potassium: 4.4 mmol/L (ref 3.5–5.1)
Sodium: 137 mmol/L (ref 135–145)
Total Bilirubin: 0.3 mg/dL (ref 0.3–1.2)
Total Protein: 6.2 g/dL — ABNORMAL LOW (ref 6.5–8.1)

## 2022-10-06 LAB — LACTATE DEHYDROGENASE: LDH: 139 U/L (ref 98–192)

## 2022-10-06 MED ORDER — SODIUM CHLORIDE 0.9 % IV SOLN: Freq: Once | INTRAVENOUS | Status: AC

## 2022-10-06 MED ORDER — DIPHENHYDRAMINE HCL 50 MG/ML IJ SOLN
25.0000 mg | Freq: Once | INTRAMUSCULAR | Status: AC
  Administered 2022-10-06: 25 mg via INTRAVENOUS
  Filled 2022-10-06: qty 1

## 2022-10-06 MED ORDER — HEPARIN SOD (PORK) LOCK FLUSH 100 UNIT/ML IV SOLN
500.0000 [IU] | Freq: Once | INTRAVENOUS | Status: AC | PRN
  Administered 2022-10-06: 500 [IU]

## 2022-10-06 MED ORDER — SODIUM CHLORIDE 0.9 % IV SOLN
10.0000 mg | Freq: Once | INTRAVENOUS | Status: AC
  Administered 2022-10-06: 10 mg via INTRAVENOUS
  Filled 2022-10-06: qty 10

## 2022-10-06 MED ORDER — ACETAMINOPHEN 325 MG PO TABS
650.0000 mg | ORAL_TABLET | Freq: Once | ORAL | Status: AC
  Administered 2022-10-06: 650 mg via ORAL
  Filled 2022-10-06: qty 2

## 2022-10-06 MED ORDER — PALONOSETRON HCL INJECTION 0.25 MG/5ML
0.2500 mg | Freq: Once | INTRAVENOUS | Status: AC
  Administered 2022-10-06: 0.25 mg via INTRAVENOUS
  Filled 2022-10-06: qty 5

## 2022-10-06 MED ORDER — SODIUM CHLORIDE 0.9 % IV SOLN
600.0000 mg | Freq: Once | INTRAVENOUS | Status: AC
  Administered 2022-10-06: 600 mg via INTRAVENOUS
  Filled 2022-10-06: qty 60

## 2022-10-06 MED ORDER — SODIUM CHLORIDE 0.9% FLUSH
10.0000 mL | INTRAVENOUS | Status: DC | PRN
  Administered 2022-10-06: 10 mL

## 2022-10-06 MED ORDER — CYCLOBENZAPRINE HCL 5 MG PO TABS
7.5000 mg | ORAL_TABLET | Freq: Three times a day (TID) | ORAL | 1 refills | Status: DC | PRN
  Filled 2022-10-06: qty 90, 20d supply, fill #0
  Filled 2022-11-05: qty 90, 20d supply, fill #1

## 2022-10-06 MED ORDER — ATROPINE SULFATE 1 MG/ML IV SOLN: 0.5000 mg | Freq: Once | INTRAVENOUS | Status: DC | PRN

## 2022-10-06 MED ORDER — CYCLOBENZAPRINE HCL 7.5 MG PO TABS: 7.5000 mg | ORAL_TABLET | Freq: Three times a day (TID) | ORAL | 1 refills | Status: DC | PRN

## 2022-10-06 MED ORDER — SODIUM CHLORIDE 0.9 % IV SOLN
150.0000 mg | Freq: Once | INTRAVENOUS | Status: AC
  Administered 2022-10-06: 150 mg via INTRAVENOUS
  Filled 2022-10-06: qty 150

## 2022-10-06 MED ORDER — FAMOTIDINE IN NACL 20-0.9 MG/50ML-% IV SOLN
20.0000 mg | Freq: Once | INTRAVENOUS | Status: AC
  Administered 2022-10-06: 20 mg via INTRAVENOUS
  Filled 2022-10-06: qty 50

## 2022-10-06 NOTE — Progress Notes (Signed)
Hematology and Oncology Follow Up Visit  Ariana Herrera [id number] Feb 29, 1966 56 y.o. 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  She is quite busy.  She had a very nice Thanksgiving.  She will have a very busy Christmas.  Unfortunate, she is still smoking.  She is trying to stop.  Unfortunate, she is around a lot of people do smoke.  She has had no problems with nausea or vomiting.  She has had occasional diarrhea.  She takes Imodium for this.  She has had no bleeding.  There is been no rashes.  She has had no leg swelling.  There is been no headache.  She does complain of muscle cramps.  We will see about getting her on some Flexeril for this.  She has had a lot of tearing with the eyes.  She may have some problems with blocked tear ducts.  I told her to see about her optometrist and then they can possibly make a referral to ophthalmology if there is a problem with blocked tear ducts.    There has been no fever.  Her last CA 27.29 was down to 26.  This was drawn in October.  Overall, I would say performance status is probably ECOG 1.  Medications:  Allergies as of 10/06/2022       Reactions   Lithium Other (See Comments)   Spinal fluid built up in brain   Dulaglutide Nausea And Vomiting, Other (See Comments)   Penicillin G Other (See Comments)   Penicillins Other (See Comments)   UNKNOWN CHILDHOOD REACTION        Medication List        Accurate as of October 06, 2022 11:13 AM. If you have any questions, ask your nurse or doctor.          Accu-Chek Guide test strip Generic drug: glucose  blood 3 (three) times daily.   albuterol 108 (90 Base) MCG/ACT inhaler Commonly known as: VENTOLIN HFA Inhale 2 puffs into the lungs every 6 (six) hours as needed for wheezing or shortness of breath.   albuterol 1.25 MG/3ML nebulizer solution Commonly known as: ACCUNEB Take 1 ampule by nebulization 3 (three) times daily as needed for shortness of breath.   B-D UF III MINI PEN NEEDLES 31G X 5 MM Misc Generic drug: Insulin Pen Needle Use to administer insulin 4 times daily   dexamethasone 4 MG tablet Commonly known as: DECADRON Take 4 mg by mouth.   fenofibrate 145 MG tablet Commonly known as: TRICOR Take 145 mg by mouth daily.   FreeStyle Libre 2 Sensor Misc   HYDROcodone-acetaminophen 7.5-325 MG tablet Commonly known as: Norco Take 1 tablet by mouth every 4 (four) hours as needed for moderate pain.   insulin glargine 100 UNIT/ML injection Commonly known as: LANTUS Inject 25 Units into the skin at bedtime.   loperamide 2 MG capsule Commonly known as: IMODIUM Take 2 mg by mouth as needed.   losartan 25 MG tablet Commonly known as: COZAAR Take 1 tablet (25 mg total) by mouth daily. What changed: how much to take   metFORMIN 1000 MG tablet  Commonly known as: GLUCOPHAGE Take 1,000 mg by mouth 2 (two) times daily with a meal.   metoprolol succinate 25 MG 24 hr tablet Commonly known as: TOPROL-XL TAKE 1 AND 1/2 TABLETS BY MOUTH EVERY DAY What changed: how much to take   nitroGLYCERIN 0.4 MG SL tablet Commonly known as: NITROSTAT Place 1 tablet (0.4 mg total) under the tongue every 5 (five) minutes as needed for chest pain.   NovoLOG FlexPen 100 UNIT/ML FlexPen Generic drug: insulin aspart Inject 3 Units into the skin 3 (three) times daily with meals. What changed: how much to take   OLANZapine 10 MG tablet Commonly known as: ZYPREXA TAKE 1 TABLET BY MOUTH EVERYDAY AT BEDTIME   omeprazole 20 MG capsule Commonly known as: PRILOSEC Take 20 mg by mouth daily.    ondansetron 8 MG tablet Commonly known as: ZOFRAN Take 8 mg by mouth 3 (three) times daily.   pravastatin 40 MG tablet Commonly known as: PRAVACHOL Take 1 tablet (40 mg total) by mouth daily.   QUEtiapine 400 MG tablet Commonly known as: SEROQUEL Take 800 mg by mouth at bedtime.        Allergies:  Allergies  Allergen Reactions   Lithium Other (See Comments)    Spinal fluid built up in brain   Dulaglutide Nausea And Vomiting and Other (See Comments)   Penicillin G Other (See Comments)   Penicillins Other (See Comments)    UNKNOWN CHILDHOOD REACTION    Past Medical History, Surgical history, Social history, and Family History were reviewed and updated.  Review of Systems: Review of Systems  Constitutional:  Positive for malaise/fatigue.  HENT: Negative.    Eyes: Negative.   Respiratory: Negative.    Cardiovascular:  Positive for leg swelling.  Gastrointestinal:  Positive for nausea.  Genitourinary: Negative.   Musculoskeletal:  Positive for joint pain and myalgias.  Skin: Negative.   Neurological:  Positive for tingling.  Endo/Heme/Allergies: Negative.   Psychiatric/Behavioral: Negative.       Physical Exam:  height is '5\' 2"'$  (1.575 m) and weight is 192 lb (87.1 kg). Her oral temperature is 98.3 F (36.8 C). Her blood pressure is 126/50 (abnormal) and her pulse is 102 (abnormal). Her respiration is 18 and oxygen saturation is 100%.   Wt Readings from Last 3 Encounters:  10/06/22 192 lb (87.1 kg)  09/08/22 (P) 184 lb (83.5 kg)  08/05/22 180 lb (81.6 kg)    Physical Exam Vitals reviewed.  HENT:     Head: Normocephalic and atraumatic.  Eyes:     Pupils: Pupils are equal, round, and reactive to light.  Cardiovascular:     Rate and Rhythm: Normal rate and regular rhythm.     Heart sounds: Normal heart sounds.  Pulmonary:     Effort: Pulmonary effort is normal.     Breath sounds: Normal breath sounds.  Abdominal:     General: Bowel sounds are normal.      Palpations: Abdomen is soft.  Musculoskeletal:        General: No tenderness or deformity. Normal range of motion.     Cervical back: Normal range of motion.  Lymphadenopathy:     Cervical: No cervical adenopathy.  Skin:    General: Skin is warm and dry.     Findings: No erythema or rash.  Neurological:     Mental Status: She is alert and oriented to person, place, and time.  Psychiatric:        Behavior: Behavior normal.  Thought Content: Thought content normal.        Judgment: Judgment normal.     Lab Results  Component Value Date   WBC 5.3 10/06/2022   HGB 11.5 (L) 10/06/2022   HCT 34.9 (L) 10/06/2022   MCV 99.1 10/06/2022   PLT 222 10/06/2022   Lab Results  Component Value Date   FERRITIN 42 08/05/2022   IRON 57 08/05/2022   TIBC 360 08/05/2022   UIBC 303 08/05/2022   IRONPCTSAT 16 08/05/2022   Lab Results  Component Value Date   RETICCTPCT 4.0 (H) 08/05/2022   RBC 3.52 (L) 10/06/2022   No results found for: "KPAFRELGTCHN", "LAMBDASER", "KAPLAMBRATIO" No results found for: "IGGSERUM", "IGA", "IGMSERUM" No results found for: "TOTALPROTELP", "ALBUMINELP", "A1GS", "A2GS", "BETS", "BETA2SER", "GAMS", "MSPIKE", "SPEI"   Chemistry      Component Value Date/Time   NA 137 10/06/2022 0940   K 4.4 10/06/2022 0940   CL 106 10/06/2022 0940   CO2 23 10/06/2022 0940   BUN 16 10/06/2022 0940   CREATININE 0.92 10/06/2022 0940      Component Value Date/Time   CALCIUM 9.0 10/06/2022 0940   ALKPHOS 55 10/06/2022 0940   AST 11 (L) 10/06/2022 0940   ALT 7 10/06/2022 0940   BILITOT 0.3 10/06/2022 0940       Impression and Plan: [name] is a very pleasant  56 yo caucasian female with recurrent ductal carcinoma of the left breast.   She is on [place].  She has had 4 cycles to date.  We omitted  day #8 of therapy back in November.  I just did not think that we need to be this aggressive.  I just do not want her to have cytopenias from the treatment.  We will  continue to treat her once a month.  If we do find that there is any evidence of progression, then we can certainly increase the frequency of treatment.  I am just happy that her quality of life is doing so well.  Happy that she is able to enjoy the holidays.  Again, the smoking might be an issue.  She is going to do her best to cut back on this.  We will plan to get her back in 1 month.  We probably will do a PET scan on her in January or February.     Volanda Napoleon, MD 12/13/202311:13 AM

## 2022-10-06 NOTE — Patient Instructions (Signed)

## 2022-10-06 NOTE — Patient Instructions (Signed)
[name]  Discharge Instructions: Thank you for choosing [facility] to provide your oncology and hematology care.   If you have a lab appointment with the [facility], please go directly to the [facility] and check in at the registration area.  Wear comfortable clothing and clothing appropriate for easy access to any Portacath or PICC line.   We strive to give you quality time with your provider. You may need to reschedule your appointment if you arrive late (15 or more minutes).  Arriving late affects you and other patients whose appointments are after yours.  Also, if you miss three or more appointments without notifying the office, you may be dismissed from the clinic at the provider's discretion.      For prescription refill requests, have your pharmacy contact our office and allow 72 hours for refills to be completed.    Today you received the following chemotherapy and/or immunotherapy agents Ariana Herrera       To help prevent nausea and vomiting after your treatment, we encourage you to take your nausea medication as directed.  BELOW ARE SYMPTOMS THAT SHOULD BE REPORTED IMMEDIATELY: *FEVER GREATER THAN 100.4 F (38 C) OR HIGHER *CHILLS OR SWEATING *NAUSEA AND VOMITING THAT IS NOT CONTROLLED WITH YOUR NAUSEA MEDICATION *UNUSUAL SHORTNESS OF BREATH *UNUSUAL BRUISING OR BLEEDING *URINARY PROBLEMS (pain or burning when urinating, or frequent urination) *BOWEL PROBLEMS (unusual diarrhea, constipation, pain near the anus) TENDERNESS IN MOUTH AND THROAT WITH OR WITHOUT PRESENCE OF ULCERS (sore throat, sores in mouth, or a toothache) UNUSUAL RASH, SWELLING OR PAIN  UNUSUAL VAGINAL DISCHARGE OR ITCHING   Items with * indicate a potential emergency and should be followed up as soon as possible or go to the Emergency Department if any problems should occur.  Please show the CHEMOTHERAPY ALERT CARD or IMMUNOTHERAPY ALERT CARD at check-in to the  Emergency Department and triage nurse. Should you have questions after your visit or need to cancel or reschedule your appointment, please contact [facility]  [phone] and follow the prompts.  Office hours are 8:00 a.m. to 4:30 p.m. Monday - Friday. Please note that voicemails left after 4:00 p.m. may not be returned until the following business day.  We are closed weekends and major holidays. You have access to a nurse at all times for urgent questions. Please call the main number to the clinic [phone] and follow the prompts.  For any non-urgent questions, you may also contact your provider using MyChart. We now offer e-Visits for anyone 53 and older to request care online for non-urgent symptoms. For details visit [email or web address].   Also download the MyChart app! Go to the app store, search "MyChart", open the app, select [facility], and log in with your MyChart username and password.  Masks are optional in the cancer centers. If you would like for your care team to wear a mask while they are taking care of you, please let them know. You may have one support person who is at least 54 years old accompany you for your appointments.

## 2022-10-06 NOTE — Progress Notes (Signed)
[facility] CSW Progress Note  Holiday representative met with patient to assess needs.  Patient continues struggling with food insecurity.  Provided a bag of food from the food pantry.  Also gave her one [employer] card.  She began applications for the [place] Program.  Provided her with a self-addressed stamped envelope to return the [employer] application in since she has some of the information at home and not with her.  She said she understood.  No other needs at this time.    Rodman Pickle Neal Trulson, LCSW    Patient is participating in a Managed Medicaid Plan:  Yes

## 2022-10-06 NOTE — Progress Notes (Signed)
Patient continues to do well on treatment. She will not need scans until after her next cycle.   Oncology Nurse Navigator Documentation     10/06/2022   11:00 AM  Oncology Nurse Navigator Flowsheets  Navigator Follow Up Date: 11/04/2022  Navigator Follow Up Reason: Follow-up Appointment;Chemotherapy  Navigator Location [facility]  Navigator Encounter Type Treatment;Appt/Treatment Plan Review  Patient Visit Type MedOnc  Treatment Phase Active Tx  Barriers/Navigation Needs Coordination of Care;Education  Interventions Psycho-Social Support  Acuity Level 2-Minimal Needs (1-2 Barriers Identified)  Support Groups/Services Friends and Family  Time Spent with Patient 15

## 2022-10-07 ENCOUNTER — Other Ambulatory Visit: Payer: Self-pay

## 2022-10-07 LAB — CANCER ANTIGEN 27.29: CA 27.29: 22.7 U/mL (ref 0.0–38.6)

## 2022-10-08 ENCOUNTER — Other Ambulatory Visit: Payer: Self-pay

## 2022-10-08 ENCOUNTER — Encounter: Payer: Self-pay | Admitting: Hematology & Oncology

## 2022-10-10 ENCOUNTER — Other Ambulatory Visit: Payer: Self-pay

## 2022-10-11 ENCOUNTER — Other Ambulatory Visit (HOSPITAL_BASED_OUTPATIENT_CLINIC_OR_DEPARTMENT_OTHER): Payer: Self-pay

## 2022-10-11 ENCOUNTER — Other Ambulatory Visit: Payer: Self-pay | Admitting: Hematology & Oncology

## 2022-10-11 DIAGNOSIS — C50011 Malignant neoplasm of nipple and areola, right female breast: Secondary | ICD-10-CM

## 2022-10-11 DIAGNOSIS — M25512 Pain in left shoulder: Secondary | ICD-10-CM

## 2022-10-11 MED ORDER — HYDROCODONE-ACETAMINOPHEN 7.5-325 MG PO TABS
1.0000 | ORAL_TABLET | Freq: Four times a day (QID) | ORAL | 0 refills | Status: DC | PRN
  Filled 2022-10-11: qty 120, 30d supply, fill #0

## 2022-10-26 ENCOUNTER — Other Ambulatory Visit (HOSPITAL_BASED_OUTPATIENT_CLINIC_OR_DEPARTMENT_OTHER): Payer: Self-pay

## 2022-10-26 MED ORDER — METHOCARBAMOL 750 MG PO TABS
750.0000 mg | ORAL_TABLET | Freq: Two times a day (BID) | ORAL | 0 refills | Status: DC
  Filled 2022-10-26: qty 28, 14d supply, fill #0

## 2022-10-28 ENCOUNTER — Inpatient Hospital Stay: Payer: Medicaid Other | Attending: Hematology & Oncology | Admitting: Hematology & Oncology

## 2022-10-28 ENCOUNTER — Encounter: Payer: Self-pay | Admitting: Hematology & Oncology

## 2022-10-28 VITALS — BP 155/68 | HR 94 | Temp 98.1°F | Resp 18 | Wt 179.1 lb

## 2022-10-28 DIAGNOSIS — Z171 Estrogen receptor negative status [ER-]: Secondary | ICD-10-CM | POA: Diagnosis not present

## 2022-10-28 DIAGNOSIS — C7989 Secondary malignant neoplasm of other specified sites: Secondary | ICD-10-CM | POA: Diagnosis not present

## 2022-10-28 DIAGNOSIS — C50011 Malignant neoplasm of nipple and areola, right female breast: Secondary | ICD-10-CM

## 2022-10-28 DIAGNOSIS — F17210 Nicotine dependence, cigarettes, uncomplicated: Secondary | ICD-10-CM | POA: Diagnosis not present

## 2022-10-28 DIAGNOSIS — C50912 Malignant neoplasm of unspecified site of left female breast: Secondary | ICD-10-CM | POA: Insufficient documentation

## 2022-10-28 NOTE — Progress Notes (Signed)
Hematology and Oncology Follow Up Visit  Ariana Herrera [id number] [birth year] 57 y.o. 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  He wishes to stop treatment.  I certainly would have no problems with this.  I think she is done incredibly well.  Her tumor markers have been normal.  Her last CA 27.29 was down to 23.  Her last PET scan was done in early November.  This did not show any obvious metastatic disease.  She wishes to stop treatment.  She says the treatment is just hard on her.  I will certainly honor her wish.  She is done very very well.  We are looking at quality of life.  So far, her quality of life has been dictated by her chemotherapy protocol.  I think her biggest issues can be of smoking.  Sure she smokes quite a bit.  I think she will be at risk for lung cancer or this could certainly increase the risk of her breast cancer recurring.  She does have chronic pain issues.  She is on medication for chronic pain.  She is having no problems with bowels or bladder.  She is having no nausea or vomiting.  She is having no rashes.  She may have a little bit of swelling in the left foot.  There is no fever.  Overall, I would say performance status is probably ECOG 1.   Medications:  Allergies as of 10/28/2022       Reactions   Lithium Other (See Comments)   Spinal fluid built up in brain   Dulaglutide Nausea And Vomiting, Other (See Comments)   Penicillin G Other (See Comments)   Penicillins Other (See Comments)   UNKNOWN CHILDHOOD REACTION        Medication List         Accurate as of October 28, 2022 12:53 PM. If you have any questions, ask your nurse or doctor.          Accu-Chek Guide test strip Generic drug: glucose blood 3 (three) times daily.   albuterol 108 (90 Base) MCG/ACT inhaler Commonly known as: VENTOLIN HFA Inhale 2 puffs into the lungs every 6 (six) hours as needed for wheezing or shortness of breath.   albuterol 1.25 MG/3ML nebulizer solution Commonly known as: ACCUNEB Take 1 ampule by nebulization 3 (three) times daily as needed for shortness of breath.   B-D UF III MINI PEN NEEDLES 31G X 5 MM Misc Generic drug: Insulin Pen Needle Use to administer insulin 4 times daily   cyclobenzaprine 5 MG tablet Commonly known as: FLEXERIL Take 1.5 tablets (7.5 mg total) by mouth 3 (three) times daily as needed for muscle spasms.   dexamethasone 4 MG tablet Commonly known as: DECADRON Take 4 mg by mouth.   fenofibrate 145 MG tablet Commonly known as: TRICOR Take 145 mg by mouth daily.   FreeStyle Libre 2 Sensor Misc   HYDROcodone-acetaminophen 7.5-325 MG tablet Commonly known as: Norco Take 1 tablet by mouth every 6 (six) hours as needed for moderate pain.   insulin glargine 100 UNIT/ML injection Commonly  known as: LANTUS Inject 25 Units into the skin at bedtime.   loperamide 2 MG capsule Commonly known as: IMODIUM Take 2 mg by mouth as needed.   losartan 25 MG tablet Commonly known as: COZAAR Take 1 tablet (25 mg total) by mouth daily. What changed: how much to take   metFORMIN 1000 MG tablet Commonly known as: GLUCOPHAGE Take 1,000 mg by mouth 2 (two) times daily with a meal.   methocarbamol 750 MG tablet Commonly known as: ROBAXIN Take 1 tablet (750 mg total) by mouth in the morning and at bedtime.   metoprolol succinate 25 MG 24 hr tablet Commonly known as: TOPROL-XL TAKE 1 AND 1/2 TABLETS BY MOUTH EVERY DAY What changed: how much to take   nitroGLYCERIN 0.4 MG SL tablet Commonly known as: NITROSTAT Place  1 tablet (0.4 mg total) under the tongue every 5 (five) minutes as needed for chest pain.   NovoLOG FlexPen 100 UNIT/ML FlexPen Generic drug: insulin aspart Inject 3 Units into the skin 3 (three) times daily with meals. What changed: how much to take   OLANZapine 10 MG tablet Commonly known as: ZYPREXA TAKE 1 TABLET BY MOUTH EVERYDAY AT BEDTIME   omeprazole 20 MG capsule Commonly known as: PRILOSEC Take 20 mg by mouth daily.   ondansetron 8 MG tablet Commonly known as: ZOFRAN Take 8 mg by mouth 3 (three) times daily.   pravastatin 40 MG tablet Commonly known as: PRAVACHOL Take 1 tablet (40 mg total) by mouth daily.   QUEtiapine 400 MG tablet Commonly known as: SEROQUEL Take 800 mg by mouth at bedtime.        Allergies:  Allergies  Allergen Reactions   Lithium Other (See Comments)    Spinal fluid built up in brain   Dulaglutide Nausea And Vomiting and Other (See Comments)   Penicillin G Other (See Comments)   Penicillins Other (See Comments)    UNKNOWN CHILDHOOD REACTION    Past Medical History, Surgical history, Social history, and Family History were reviewed and updated.  Review of Systems: Review of Systems  Constitutional:  Positive for malaise/fatigue.  HENT: Negative.    Eyes: Negative.   Respiratory: Negative.    Cardiovascular:  Positive for leg swelling.  Gastrointestinal:  Positive for nausea.  Genitourinary: Negative.   Musculoskeletal:  Positive for joint pain and myalgias.  Skin: Negative.   Neurological:  Positive for tingling.  Endo/Heme/Allergies: Negative.   Psychiatric/Behavioral: Negative.       Physical Exam:  weight is 179 lb 1.3 oz (81.2 kg). Her oral temperature is 98.1 F (36.7 C). Her blood pressure is 155/68 (abnormal) and her pulse is 94. Her respiration is 18 and oxygen saturation is 100%.   Wt Readings from Last 3 Encounters:  10/28/22 179 lb 1.3 oz (81.2 kg)  10/06/22 192 lb (87.1 kg)  09/08/22 (P) 184 lb (83.5 kg)     Physical Exam Vitals reviewed.  HENT:     Head: Normocephalic and atraumatic.  Eyes:     Pupils: Pupils are equal, round, and reactive to light.  Cardiovascular:     Rate and Rhythm: Normal rate and regular rhythm.     Heart sounds: Normal heart sounds.  Pulmonary:     Effort: Pulmonary effort is normal.     Breath sounds: Normal breath sounds.  Abdominal:     General: Bowel sounds are normal.     Palpations: Abdomen is soft.  Musculoskeletal:        General: No tenderness  or deformity. Normal range of motion.     Cervical back: Normal range of motion.  Lymphadenopathy:     Cervical: No cervical adenopathy.  Skin:    General: Skin is warm and dry.     Findings: No erythema or rash.  Neurological:     Mental Status: She is alert and oriented to person, place, and time.  Psychiatric:        Behavior: Behavior normal.        Thought Content: Thought content normal.        Judgment: Judgment normal.     Lab Results  Component Value Date   WBC 5.3 10/06/2022   HGB 11.5 (L) 10/06/2022   HCT 34.9 (L) 10/06/2022   MCV 99.1 10/06/2022   PLT 222 10/06/2022   Lab Results  Component Value Date   FERRITIN 42 08/05/2022   IRON 57 08/05/2022   TIBC 360 08/05/2022   UIBC 303 08/05/2022   IRONPCTSAT 16 08/05/2022   Lab Results  Component Value Date   RETICCTPCT 4.0 (H) 08/05/2022   RBC 3.52 (L) 10/06/2022   No results found for: "KPAFRELGTCHN", "LAMBDASER", "KAPLAMBRATIO" No results found for: "IGGSERUM", "IGA", "IGMSERUM" No results found for: "TOTALPROTELP", "ALBUMINELP", "A1GS", "A2GS", "BETS", "BETA2SER", "GAMS", "MSPIKE", "SPEI"   Chemistry      Component Value Date/Time   NA 137 10/06/2022 0940   K 4.4 10/06/2022 0940   CL 106 10/06/2022 0940   CO2 23 10/06/2022 0940   BUN 16 10/06/2022 0940   CREATININE 0.92 10/06/2022 0940      Component Value Date/Time   CALCIUM 9.0 10/06/2022 0940   ALKPHOS 55 10/06/2022 0940   AST 11 (L) 10/06/2022 0940   ALT 7  10/06/2022 0940   BILITOT 0.3 10/06/2022 0940       Impression and Plan: Ms. Llera is a very pleasant  57 yo caucasian female with recurrent ductal carcinoma of the left breast.   She is on [place].  She has had 4 cycles to date.    Again, she wants to stop treatment.  Again I think this would be reasonable.  We will certainly have that risk for recurrence.  How long this will take is hard to say.  Hopefully, we can get her through most of the year without having to do any treatment.  Again, smoking will be her biggest downfall.  She continues to smoke quite a bit.  For right now, we will plan for another PET scan in about a month.  I think this would be reasonable.  She still has a Port-A-Cath and so we will have to flush this every 6 weeks.  We will get her back to see [place] in about 6 weeks or so.  We will do the PET scan a week before I see her.    Volanda Napoleon, MD 1/4/202412:53 PM

## 2022-10-29 ENCOUNTER — Encounter: Payer: Self-pay | Admitting: *Deleted

## 2022-10-29 NOTE — Progress Notes (Signed)
Patient has decided to stop treatment due to side effects. She will need a PET scan prior to her next appointment. Scheduled for 11/29/2022.  Patient is aware of PET appointment including date, time, and location. The following prep is reviewed with patient and confirmed with teachback: - arrive 30 minutes before appointment time - NPO except water for 6h before scan. No candy, no gum - hold any diabetic medication the morning of the scan. Long acting insulin will be held after midnight and short acting insulin will be held for 2h.  - have a low carb dinner the night prior Radiology Information sheet also mailed to patient's home for reinforcement of education.   Oncology Nurse Navigator Documentation     10/29/2022    9:00 AM  Oncology Nurse Navigator Flowsheets  Navigator Follow Up Date: 11/29/2022  Navigator Follow Up Reason: Scan Review  Navigator Location [facility]  Navigator Encounter Type Appt/Treatment Plan Review;Telephone  Telephone Outgoing Call;Appt Confirmation/Clarification  Patient Visit Type MedOnc  Treatment Phase Other  Barriers/Navigation Needs Coordination of Care;Education  Education Other  Interventions Coordination of Care;Education  Acuity Level 2-Minimal Needs (1-2 Barriers Identified)  Coordination of Care Radiology  Education Method Verbal;Written  Support Groups/Services Friends and Family  Time Spent with Patient 30

## 2022-11-04 ENCOUNTER — Ambulatory Visit: Payer: Medicaid Other

## 2022-11-04 ENCOUNTER — Inpatient Hospital Stay: Payer: Medicaid Other

## 2022-11-04 ENCOUNTER — Other Ambulatory Visit: Payer: Medicaid Other

## 2022-11-04 ENCOUNTER — Ambulatory Visit: Payer: Medicaid Other | Admitting: Hematology & Oncology

## 2022-11-05 ENCOUNTER — Other Ambulatory Visit: Payer: Self-pay

## 2022-11-05 ENCOUNTER — Other Ambulatory Visit (HOSPITAL_BASED_OUTPATIENT_CLINIC_OR_DEPARTMENT_OTHER): Payer: Self-pay

## 2022-11-09 ENCOUNTER — Other Ambulatory Visit: Payer: Self-pay | Admitting: Hematology & Oncology

## 2022-11-09 ENCOUNTER — Encounter: Payer: Self-pay | Admitting: Hematology & Oncology

## 2022-11-09 ENCOUNTER — Other Ambulatory Visit (HOSPITAL_BASED_OUTPATIENT_CLINIC_OR_DEPARTMENT_OTHER): Payer: Self-pay

## 2022-11-09 DIAGNOSIS — C50011 Malignant neoplasm of nipple and areola, right female breast: Secondary | ICD-10-CM

## 2022-11-09 DIAGNOSIS — M25512 Pain in left shoulder: Secondary | ICD-10-CM

## 2022-11-09 MED ORDER — HYDROCODONE-ACETAMINOPHEN 7.5-325 MG PO TABS
1.0000 | ORAL_TABLET | Freq: Four times a day (QID) | ORAL | 0 refills | Status: DC | PRN
  Filled 2022-11-09 (×2): qty 120, 30d supply, fill #0

## 2022-11-29 ENCOUNTER — Encounter (HOSPITAL_COMMUNITY)
Admission: RE | Admit: 2022-11-29 | Discharge: 2022-11-29 | Disposition: A | Discharge status: Home / Self Care | Payer: Medicaid Other | Source: Ambulatory Visit | Attending: Hematology & Oncology | Admitting: Hematology & Oncology

## 2022-11-29 DIAGNOSIS — C50011 Malignant neoplasm of nipple and areola, right female breast: Secondary | ICD-10-CM | POA: Diagnosis present

## 2022-11-29 LAB — GLUCOSE, CAPILLARY
Glucose-Capillary: 275 mg/dL — ABNORMAL HIGH (ref 70–99)
Glucose-Capillary: 286 mg/dL — ABNORMAL HIGH (ref 70–99)

## 2022-11-29 MED ORDER — FLUDEOXYGLUCOSE F - 18 (FDG) INJECTION
8.9000 | Freq: Once | INTRAVENOUS | Status: AC
  Administered 2022-11-29: 8.9 via INTRAVENOUS

## 2022-11-30 ENCOUNTER — Encounter: Payer: Self-pay | Admitting: *Deleted

## 2022-11-30 ENCOUNTER — Telehealth: Payer: Self-pay

## 2022-11-30 ENCOUNTER — Other Ambulatory Visit: Payer: Self-pay | Admitting: Hematology & Oncology

## 2022-11-30 DIAGNOSIS — C50011 Malignant neoplasm of nipple and areola, right female breast: Secondary | ICD-10-CM

## 2022-11-30 NOTE — Progress Notes (Signed)
Patient's PET shows no signs of malignancy.   Oncology Nurse Navigator Documentation     11/30/2022    9:15 AM  Oncology Nurse Navigator Flowsheets  Navigator Follow Up Date: 12/02/2022  Navigator Follow Up Reason: Follow-up Appointment  Navigator Location [facility]  Navigator Encounter Type Scan Review  Patient Visit Type MedOnc  Treatment Phase Post-Tx Follow-up  Barriers/Navigation Needs Coordination of Care;Education  Interventions None Required  Acuity Level 2-Minimal Needs (1-2 Barriers Identified)  Support Groups/Services Friends and Family  Time Spent with Patient 15

## 2022-11-30 NOTE — Telephone Encounter (Signed)
-----   Message from Volanda Napoleon, MD sent at 11/29/2022  4:18 PM EST ----- Call and let her know that the PET scan does not show any obvious active breast cancer.  Thanks.  Laurey Arrow

## 2022-12-01 ENCOUNTER — Other Ambulatory Visit: Payer: Self-pay | Admitting: *Deleted

## 2022-12-01 DIAGNOSIS — C50011 Malignant neoplasm of nipple and areola, right female breast: Secondary | ICD-10-CM

## 2022-12-02 ENCOUNTER — Inpatient Hospital Stay: Payer: Medicaid Other

## 2022-12-02 ENCOUNTER — Other Ambulatory Visit: Payer: Self-pay

## 2022-12-02 ENCOUNTER — Encounter: Payer: Self-pay | Admitting: Hematology & Oncology

## 2022-12-02 ENCOUNTER — Inpatient Hospital Stay (HOSPITAL_BASED_OUTPATIENT_CLINIC_OR_DEPARTMENT_OTHER): Payer: Medicaid Other | Admitting: Hematology & Oncology

## 2022-12-02 ENCOUNTER — Encounter: Payer: Self-pay | Admitting: *Deleted

## 2022-12-02 ENCOUNTER — Inpatient Hospital Stay: Payer: Medicaid Other | Attending: Hematology & Oncology

## 2022-12-02 VITALS — BP 154/88 | HR 89 | Temp 98.1°F | Resp 18 | Ht 62.0 in | Wt 185.0 lb

## 2022-12-02 DIAGNOSIS — F17210 Nicotine dependence, cigarettes, uncomplicated: Secondary | ICD-10-CM | POA: Insufficient documentation

## 2022-12-02 DIAGNOSIS — Z95828 Presence of other vascular implants and grafts: Secondary | ICD-10-CM

## 2022-12-02 DIAGNOSIS — C50011 Malignant neoplasm of nipple and areola, right female breast: Secondary | ICD-10-CM | POA: Diagnosis not present

## 2022-12-02 DIAGNOSIS — R911 Solitary pulmonary nodule: Secondary | ICD-10-CM | POA: Diagnosis not present

## 2022-12-02 DIAGNOSIS — Z853 Personal history of malignant neoplasm of breast: Secondary | ICD-10-CM | POA: Insufficient documentation

## 2022-12-02 LAB — CBC WITH DIFFERENTIAL (CANCER CENTER ONLY)
Abs Immature Granulocytes: 0.04 10*3/uL (ref 0.00–0.07)
Basophils Absolute: 0 10*3/uL (ref 0.0–0.1)
Basophils Relative: 0 %
Eosinophils Absolute: 0 10*3/uL (ref 0.0–0.5)
Eosinophils Relative: 0 %
HCT: 34.5 % — ABNORMAL LOW (ref 36.0–46.0)
Hemoglobin: 11.6 g/dL — ABNORMAL LOW (ref 12.0–15.0)
Immature Granulocytes: 1 %
Lymphocytes Relative: 27 %
Lymphs Abs: 1.4 10*3/uL (ref 0.7–4.0)
MCH: 31.4 pg (ref 26.0–34.0)
MCHC: 33.6 g/dL (ref 30.0–36.0)
MCV: 93.5 fL (ref 80.0–100.0)
Monocytes Absolute: 0.5 10*3/uL (ref 0.1–1.0)
Monocytes Relative: 9 %
Neutro Abs: 3.3 10*3/uL (ref 1.7–7.7)
Neutrophils Relative %: 63 %
Platelet Count: 269 10*3/uL (ref 150–400)
RBC: 3.69 MIL/uL — ABNORMAL LOW (ref 3.87–5.11)
RDW: 13.7 % (ref 11.5–15.5)
WBC Count: 5.2 10*3/uL (ref 4.0–10.5)
nRBC: 0 % (ref 0.0–0.2)

## 2022-12-02 LAB — COMPREHENSIVE METABOLIC PANEL
ALT: 18 U/L (ref 0–44)
AST: 23 U/L (ref 15–41)
Albumin: 4.1 g/dL (ref 3.5–5.0)
Alkaline Phosphatase: 48 U/L (ref 38–126)
Anion gap: 9 (ref 5–15)
BUN: 15 mg/dL (ref 6–20)
CO2: 24 mmol/L (ref 22–32)
Calcium: 9.7 mg/dL (ref 8.9–10.3)
Chloride: 107 mmol/L (ref 98–111)
Creatinine, Ser: 0.74 mg/dL (ref 0.44–1.00)
GFR, Estimated: >60 mL/min (ref >60)
Glucose, Bld: 138 mg/dL — ABNORMAL HIGH (ref 70–99)
Potassium: 3.5 mmol/L (ref 3.5–5.1)
Sodium: 140 mmol/L (ref 135–145)
Total Bilirubin: 0.3 mg/dL (ref 0.3–1.2)
Total Protein: 6.9 g/dL (ref 6.5–8.1)

## 2022-12-02 LAB — LACTATE DEHYDROGENASE: LDH: 133 U/L (ref 98–192)

## 2022-12-02 MED ORDER — HEPARIN SOD (PORK) LOCK FLUSH 100 UNIT/ML IV SOLN
500.0000 [IU] | Freq: Once | INTRAVENOUS | Status: AC
  Administered 2022-12-02: 500 [IU] via INTRAVENOUS

## 2022-12-02 MED ORDER — SODIUM CHLORIDE 0.9% FLUSH
10.0000 mL | INTRAVENOUS | Status: DC | PRN
  Administered 2022-12-02: 10 mL via INTRAVENOUS

## 2022-12-02 NOTE — Progress Notes (Signed)
Hematology and Oncology Follow Up Visit  Ariana Herrera [id number] [birth year] 57 y.o. 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  She truly enjoys not having chemotherapy.  I totally understand this.  Unfortunately, she is still smoking a pack a day.  I think this will be her problem down the road with respect to recurrence.  We did do a follow-up PET scan on her.  This was done on 11/29/2022.  There is no evidence of recurrent malignancy.  She has some postradiation changes in the left upper lobe.  She has a stable nodule that is 4 mm in the right upper lobe.  Her last CA 27.29 was stable at 23.  She is feeling okay.  Her diabetes is her biggest problem.  She has neuropathy.  She has had no change in bowel or bladder habits.  She has had no increased cough or shortness of breath.  There is been no headache.  She has had no fever.  Thankfully, she has avoided COVID.  Currently, I would say that her performance status is ECOG 1.    Medications:  Allergies as of 12/02/2022       Reactions   Lithium Other (See Comments)   Spinal fluid built up in brain   Dulaglutide Nausea And Vomiting, Other (See Comments)   Penicillin G Other (See Comments)   Penicillins Other (See Comments)   UNKNOWN CHILDHOOD REACTION        Medication List        Accurate as of December 02, 2022  2:05 PM. If you have any questions, ask your nurse or doctor.          STOP taking these medications    dexamethasone 4 MG tablet Commonly known as: DECADRON Stopped by: Volanda Napoleon, MD   loperamide 2 MG  capsule Commonly known as: IMODIUM Stopped by: Volanda Napoleon, MD   methocarbamol 750 MG tablet Commonly known as: ROBAXIN Stopped by: Volanda Napoleon, MD       TAKE these medications    Accu-Chek Guide test strip Generic drug: glucose blood 3 (three) times daily.   albuterol 108 (90 Base) MCG/ACT inhaler Commonly known as: VENTOLIN HFA Inhale 2 puffs into the lungs every 6 (six) hours as needed for wheezing or shortness of breath. What changed: Another medication with the same name was removed. Continue taking this medication, and follow the directions you see here. Changed by: Volanda Napoleon, MD   B-D UF III MINI PEN NEEDLES 31G X 5 MM Misc Generic drug: Insulin Pen Needle Use to administer insulin 4 times daily   cyclobenzaprine 5 MG tablet Commonly known as: FLEXERIL Take 1.5 tablets (7.5 mg total) by mouth 3 (three) times daily as needed for muscle spasms.   fenofibrate 145 MG tablet Commonly known as: TRICOR Take 145 mg by mouth daily.   FreeStyle Libre 2 Sensor Misc   HYDROcodone-acetaminophen 7.5-325 MG tablet Commonly known as: Norco Take 1 tablet by mouth every 6 (six) hours as needed for moderate pain.   insulin glargine 100 UNIT/ML injection Commonly known  as: LANTUS Inject 25 Units into the skin at bedtime.   losartan 25 MG tablet Commonly known as: COZAAR Take 1 tablet (25 mg total) by mouth daily. What changed: how much to take   metFORMIN 1000 MG tablet Commonly known as: GLUCOPHAGE Take 1,000 mg by mouth 2 (two) times daily with a meal.   metoprolol succinate 25 MG 24 hr tablet Commonly known as: TOPROL-XL TAKE 1 AND 1/2 TABLETS BY MOUTH EVERY DAY What changed: how much to take   nitroGLYCERIN 0.4 MG SL tablet Commonly known as: NITROSTAT Place 1 tablet (0.4 mg total) under the tongue every 5 (five) minutes as needed for chest pain.   NovoLOG FlexPen 100 UNIT/ML FlexPen Generic drug: insulin aspart Inject 3 Units into the skin 3  (three) times daily with meals. What changed: how much to take   OLANZapine 10 MG tablet Commonly known as: ZYPREXA TAKE 1 TABLET BY MOUTH EVERYDAY AT BEDTIME   omeprazole 20 MG capsule Commonly known as: PRILOSEC Take 20 mg by mouth daily.   ondansetron 8 MG tablet Commonly known as: ZOFRAN TAKE 1 TABLET (8 MG TOTAL) BY MOUTH EVERY 8 (EIGHT) HOURS AS NEEDED FOR NAUSEA OR VOMITING. TAKE 1 TABLETS ('8MG'$  TOTAL) BY MOUTH EVERY 8HRS AS NEEDED. START ON THE THIRD DAY AFTER CHEMOTHERAPY.   pravastatin 40 MG tablet Commonly known as: PRAVACHOL Take 1 tablet (40 mg total) by mouth daily.   QUEtiapine 400 MG tablet Commonly known as: SEROQUEL Take 800 mg by mouth at bedtime.        Allergies:  Allergies  Allergen Reactions   Lithium Other (See Comments)    Spinal fluid built up in brain   Dulaglutide Nausea And Vomiting and Other (See Comments)   Penicillin G Other (See Comments)   Penicillins Other (See Comments)    UNKNOWN CHILDHOOD REACTION    Past Medical History, Surgical history, Social history, and Family History were reviewed and updated.  Review of Systems: Review of Systems  Constitutional:  Positive for malaise/fatigue.  HENT: Negative.    Eyes: Negative.   Respiratory: Negative.    Cardiovascular:  Positive for leg swelling.  Gastrointestinal:  Positive for nausea.  Genitourinary: Negative.   Musculoskeletal:  Positive for joint pain and myalgias.  Skin: Negative.   Neurological:  Positive for tingling.  Endo/Heme/Allergies: Negative.   Psychiatric/Behavioral: Negative.       Physical Exam:  height is '5\' 2"'$  (1.575 m) and weight is 185 lb (83.9 kg). Her oral temperature is 98.1 F (36.7 C). Her blood pressure is 154/88 (abnormal) and her pulse is 89. Her respiration is 18 and oxygen saturation is 98%.   Wt Readings from Last 3 Encounters:  12/02/22 185 lb (83.9 kg)  10/28/22 179 lb 1.3 oz (81.2 kg)  10/06/22 192 lb (87.1 kg)    Physical Exam Vitals  reviewed.  HENT:     Head: Normocephalic and atraumatic.  Eyes:     Pupils: Pupils are equal, round, and reactive to light.  Cardiovascular:     Rate and Rhythm: Normal rate and regular rhythm.     Heart sounds: Normal heart sounds.  Pulmonary:     Effort: Pulmonary effort is normal.     Breath sounds: Normal breath sounds.  Abdominal:     General: Bowel sounds are normal.     Palpations: Abdomen is soft.  Musculoskeletal:        General: No tenderness or deformity. Normal range of motion.     Cervical  back: Normal range of motion.  Lymphadenopathy:     Cervical: No cervical adenopathy.  Skin:    General: Skin is warm and dry.     Findings: No erythema or rash.  Neurological:     Mental Status: She is alert and oriented to person, place, and time.  Psychiatric:        Behavior: Behavior normal.        Thought Content: Thought content normal.        Judgment: Judgment normal.     Lab Results  Component Value Date   WBC 5.2 12/02/2022   HGB 11.6 (L) 12/02/2022   HCT 34.5 (L) 12/02/2022   MCV 93.5 12/02/2022   PLT 269 12/02/2022   Lab Results  Component Value Date   FERRITIN 42 08/05/2022   IRON 57 08/05/2022   TIBC 360 08/05/2022   UIBC 303 08/05/2022   IRONPCTSAT 16 08/05/2022   Lab Results  Component Value Date   RETICCTPCT 4.0 (H) 08/05/2022   RBC 3.69 (L) 12/02/2022   No results found for: "KPAFRELGTCHN", "LAMBDASER", "KAPLAMBRATIO" No results found for: "IGGSERUM", "IGA", "IGMSERUM" No results found for: "TOTALPROTELP", "ALBUMINELP", "A1GS", "A2GS", "BETS", "BETA2SER", "GAMS", "MSPIKE", "SPEI"   Chemistry      Component Value Date/Time   NA 140 12/02/2022 1326   K 3.5 12/02/2022 1326   CL 107 12/02/2022 1326   CO2 24 12/02/2022 1326   BUN 15 12/02/2022 1326   CREATININE 0.74 12/02/2022 1326   CREATININE 0.92 10/06/2022 0940      Component Value Date/Time   CALCIUM 9.7 12/02/2022 1326   ALKPHOS 48 12/02/2022 1326   AST 23 12/02/2022 1326   AST  11 (L) 10/06/2022 0940   ALT 18 12/02/2022 1326   ALT 7 10/06/2022 0940   BILITOT 0.3 12/02/2022 1326   BILITOT 0.3 10/06/2022 0940       Impression and Plan: Ariana Herrera is a very pleasant  57 yo caucasian female with recurrent ductal carcinoma of the left breast.  She currently is off therapy.  She does not wish to have any further therapy.  Again this is fairly reasonable.  Her PET scan looks fine.  Her CA 27.29 has been okay.  Again I wish she would stop smoking.  I talked her about this quite a bit.  She is trying to cut back.  I do not think we have to do another scan on her until probably May or June.  I would see her back in 6 weeks.  She still has a Port-A-Cath and that we have to flush.    Volanda Napoleon, MD 2/8/20242:05 PM

## 2022-12-02 NOTE — Progress Notes (Signed)
Patient is doing well. Recent PET shows continued response. Patient has no navigational needs at this time. Will discontinue active navigation but be available to the patient as needed in the future.  Oncology Nurse Navigator Documentation     12/02/2022    2:00 PM  Oncology Nurse Navigator Flowsheets  Navigation Complete Date: 12/02/2022  Post Navigation: Continue to Follow Patient? No  Reason Not Navigating Patient: No Treatment, Observation Only  Navigator Location [facility]  Navigator Encounter Type Appt/Treatment Plan Review  Patient Visit Type MedOnc  Treatment Phase Post-Tx Follow-up  Barriers/Navigation Needs No Barriers At This Time  Interventions None Required  Acuity Level 2-Minimal Needs (1-2 Barriers Identified)  Support Groups/Services Friends and Family  Time Spent with Patient 15

## 2022-12-03 LAB — CANCER ANTIGEN 27.29: CA 27.29: 14.7 U/mL (ref 0.0–38.6)

## 2022-12-08 ENCOUNTER — Other Ambulatory Visit (HOSPITAL_BASED_OUTPATIENT_CLINIC_OR_DEPARTMENT_OTHER): Payer: Self-pay

## 2022-12-08 ENCOUNTER — Other Ambulatory Visit: Payer: Self-pay | Admitting: Hematology & Oncology

## 2022-12-08 DIAGNOSIS — C50011 Malignant neoplasm of nipple and areola, right female breast: Secondary | ICD-10-CM

## 2022-12-08 DIAGNOSIS — M25512 Pain in left shoulder: Secondary | ICD-10-CM

## 2022-12-08 MED ORDER — HYDROCODONE-ACETAMINOPHEN 7.5-325 MG PO TABS
1.0000 | ORAL_TABLET | Freq: Four times a day (QID) | ORAL | 0 refills | Status: DC | PRN
  Filled 2022-12-08: qty 120, 30d supply, fill #0

## 2022-12-31 ENCOUNTER — Encounter: Payer: Self-pay | Admitting: *Deleted

## 2023-01-04 ENCOUNTER — Other Ambulatory Visit (HOSPITAL_BASED_OUTPATIENT_CLINIC_OR_DEPARTMENT_OTHER): Payer: Self-pay

## 2023-01-04 ENCOUNTER — Other Ambulatory Visit: Payer: Self-pay | Admitting: Hematology & Oncology

## 2023-01-04 DIAGNOSIS — C50011 Malignant neoplasm of nipple and areola, right female breast: Secondary | ICD-10-CM

## 2023-01-04 DIAGNOSIS — M25512 Pain in left shoulder: Secondary | ICD-10-CM

## 2023-01-04 MED ORDER — HYDROCODONE-ACETAMINOPHEN 7.5-325 MG PO TABS
1.0000 | ORAL_TABLET | Freq: Four times a day (QID) | ORAL | 0 refills | Status: DC | PRN
  Filled 2023-01-04 (×2): qty 120, 30d supply, fill #0

## 2023-01-13 ENCOUNTER — Ambulatory Visit: Payer: Medicaid Other | Admitting: Medical Oncology

## 2023-01-13 ENCOUNTER — Inpatient Hospital Stay: Payer: Medicaid Other

## 2023-01-13 ENCOUNTER — Other Ambulatory Visit: Payer: Medicaid Other

## 2023-01-24 ENCOUNTER — Encounter: Payer: Self-pay | Admitting: Medical Oncology

## 2023-01-24 ENCOUNTER — Other Ambulatory Visit (HOSPITAL_BASED_OUTPATIENT_CLINIC_OR_DEPARTMENT_OTHER): Payer: Self-pay

## 2023-01-24 ENCOUNTER — Other Ambulatory Visit: Payer: Self-pay

## 2023-01-24 ENCOUNTER — Inpatient Hospital Stay: Payer: Medicaid Other | Attending: Hematology & Oncology

## 2023-01-24 ENCOUNTER — Inpatient Hospital Stay: Payer: Medicaid Other

## 2023-01-24 ENCOUNTER — Telehealth: Payer: Self-pay

## 2023-01-24 ENCOUNTER — Inpatient Hospital Stay (HOSPITAL_BASED_OUTPATIENT_CLINIC_OR_DEPARTMENT_OTHER): Payer: Medicaid Other | Admitting: Medical Oncology

## 2023-01-24 VITALS — BP 105/65 | HR 100 | Temp 97.8°F | Resp 18 | Ht 62.0 in | Wt 187.0 lb

## 2023-01-24 DIAGNOSIS — S50362A Insect bite (nonvenomous) of left elbow, initial encounter: Secondary | ICD-10-CM

## 2023-01-24 DIAGNOSIS — N644 Mastodynia: Secondary | ICD-10-CM | POA: Insufficient documentation

## 2023-01-24 DIAGNOSIS — Z95828 Presence of other vascular implants and grafts: Secondary | ICD-10-CM

## 2023-01-24 DIAGNOSIS — Z171 Estrogen receptor negative status [ER-]: Secondary | ICD-10-CM | POA: Insufficient documentation

## 2023-01-24 DIAGNOSIS — W57XXXA Bitten or stung by nonvenomous insect and other nonvenomous arthropods, initial encounter: Secondary | ICD-10-CM

## 2023-01-24 DIAGNOSIS — C50912 Malignant neoplasm of unspecified site of left female breast: Secondary | ICD-10-CM | POA: Insufficient documentation

## 2023-01-24 DIAGNOSIS — C50212 Malignant neoplasm of upper-inner quadrant of left female breast: Secondary | ICD-10-CM | POA: Diagnosis not present

## 2023-01-24 DIAGNOSIS — R112 Nausea with vomiting, unspecified: Secondary | ICD-10-CM

## 2023-01-24 DIAGNOSIS — C50011 Malignant neoplasm of nipple and areola, right female breast: Secondary | ICD-10-CM

## 2023-01-24 DIAGNOSIS — M795 Residual foreign body in soft tissue: Secondary | ICD-10-CM | POA: Diagnosis not present

## 2023-01-24 LAB — CBC WITH DIFFERENTIAL (CANCER CENTER ONLY)
Abs Immature Granulocytes: 0.02 10*3/uL (ref 0.00–0.07)
Basophils Absolute: 0 10*3/uL (ref 0.0–0.1)
Basophils Relative: 0 %
Eosinophils Absolute: 0 10*3/uL (ref 0.0–0.5)
Eosinophils Relative: 0 %
HCT: 36 % (ref 36.0–46.0)
Hemoglobin: 12.3 g/dL (ref 12.0–15.0)
Immature Granulocytes: 0 %
Lymphocytes Relative: 13 %
Lymphs Abs: 1.1 10*3/uL (ref 0.7–4.0)
MCH: 30.9 pg (ref 26.0–34.0)
MCHC: 34.2 g/dL (ref 30.0–36.0)
MCV: 90.5 fL (ref 80.0–100.0)
Monocytes Absolute: 0.6 10*3/uL (ref 0.1–1.0)
Monocytes Relative: 7 %
Neutro Abs: 7 10*3/uL (ref 1.7–7.7)
Neutrophils Relative %: 80 %
Platelet Count: 266 10*3/uL (ref 150–400)
RBC: 3.98 MIL/uL (ref 3.87–5.11)
RDW: 13.4 % (ref 11.5–15.5)
WBC Count: 8.7 10*3/uL (ref 4.0–10.5)
nRBC: 0 % (ref 0.0–0.2)

## 2023-01-24 LAB — CMP (CANCER CENTER ONLY)
ALT: 17 U/L (ref 0–44)
AST: 23 U/L (ref 15–41)
Albumin: 4.3 g/dL (ref 3.5–5.0)
Alkaline Phosphatase: 61 U/L (ref 38–126)
Anion gap: 12 (ref 5–15)
BUN: 15 mg/dL (ref 6–20)
CO2: 20 mmol/L — ABNORMAL LOW (ref 22–32)
Calcium: 9.1 mg/dL (ref 8.9–10.3)
Chloride: 104 mmol/L (ref 98–111)
Creatinine: 1.08 mg/dL — ABNORMAL HIGH (ref 0.44–1.00)
GFR, Estimated: >60 mL/min (ref >60)
Glucose, Bld: 253 mg/dL — ABNORMAL HIGH (ref 70–99)
Potassium: 3.7 mmol/L (ref 3.5–5.1)
Sodium: 136 mmol/L (ref 135–145)
Total Bilirubin: 0.4 mg/dL (ref 0.3–1.2)
Total Protein: 6.4 g/dL — ABNORMAL LOW (ref 6.5–8.1)

## 2023-01-24 MED ORDER — SODIUM CHLORIDE 0.9% FLUSH
10.0000 mL | INTRAVENOUS | Status: DC | PRN
  Administered 2023-01-24: 10 mL via INTRAVENOUS

## 2023-01-24 MED ORDER — DOXYCYCLINE HYCLATE 100 MG PO TABS
100.0000 mg | ORAL_TABLET | Freq: Two times a day (BID) | ORAL | 0 refills | Status: AC
  Filled 2023-01-24: qty 20, 10d supply, fill #0

## 2023-01-24 MED ORDER — HEPARIN SOD (PORK) LOCK FLUSH 100 UNIT/ML IV SOLN
500.0000 [IU] | Freq: Once | INTRAVENOUS | Status: AC
  Administered 2023-01-24: 500 [IU] via INTRAVENOUS

## 2023-01-24 NOTE — Progress Notes (Signed)
Hematology and Oncology Follow Up Visit  Ariana Herrera PH:[id number] 04-02-1966 57 y.o. 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  She continues to wish to hold off on chemotherapy.   Last PET was on 11/29/2022.  There was no evidence of recurrent malignancy. Her last CA 27.29 in Feb 2024 was actually down to 14.7.  She has some postradiation changes in the left upper lobe.  She has a stable nodule that is 4 mm in the right upper lobe.  In general she has felt poorly for the past few days. Has noticed body aches, muscle cramps, fatigue. Unsure of the cause. No cold symptoms or fevers. No rash.    She mentions that her nausea/vomiting episodes that she had while on treatment have never resolved. Her last dose was on 10/28/2022.   She has had no change in bowel or bladder habits.  She has had no increased cough or shortness of breath.  There is been no headache. No night sweats or unintentional weight loss.    Currently, I would say that her performance status is ECOG 1.    Wt Readings from Last 3 Encounters:  12/02/22 185 lb (83.9 kg)  10/28/22 179 lb 1.3 oz (81.2 kg)  10/06/22 192 lb (87.1 kg)    Medications:  Allergies as of 01/24/2023       Reactions   Lithium Other (See Comments)   Spinal fluid built up in brain   Dulaglutide Nausea And Vomiting, Other (See Comments)   Penicillin G Other (See Comments)   Penicillins Other (See Comments)   UNKNOWN CHILDHOOD REACTION        Medication List        Accurate as of January 24, 2023 11:05 AM. If you have any questions, ask your  nurse or doctor.          Accu-Chek Guide test strip Generic drug: glucose blood 3 (three) times daily.   albuterol 108 (90 Base) MCG/ACT inhaler Commonly known as: VENTOLIN HFA Inhale 2 puffs into the lungs every 6 (six) hours as needed for wheezing or shortness of breath.   B-D UF III MINI PEN NEEDLES 31G X 5 MM Misc Generic drug: Insulin Pen Needle Use to administer insulin 4 times daily   cyclobenzaprine 5 MG tablet Commonly known as: FLEXERIL Take 1.5 tablets (7.5 mg total) by mouth 3 (three) times daily as needed for muscle spasms.   fenofibrate 145 MG tablet Commonly known as: TRICOR Take 145 mg by mouth daily.   FreeStyle Libre 2 Sensor Misc   HYDROcodone-acetaminophen 7.5-325 MG tablet Commonly known as: Norco Take 1 tablet by mouth every 6 (six) hours as needed for moderate pain.   insulin glargine 100 UNIT/ML injection Commonly known as: LANTUS Inject 25 Units into the skin at bedtime.   losartan 25 MG tablet Commonly known as: COZAAR Take 1 tablet (25 mg total) by mouth daily. What changed: how much to take   metFORMIN 1000 MG tablet Commonly known as: GLUCOPHAGE Take 1,000 mg by mouth 2 (two) times daily with a meal.   metoprolol succinate  25 MG 24 hr tablet Commonly known as: TOPROL-XL TAKE 1 AND 1/2 TABLETS BY MOUTH EVERY DAY What changed: how much to take   nitroGLYCERIN 0.4 MG SL tablet Commonly known as: NITROSTAT Place 1 tablet (0.4 mg total) under the tongue every 5 (five) minutes as needed for chest pain.   NovoLOG FlexPen 100 UNIT/ML FlexPen Generic drug: insulin aspart Inject 3 Units into the skin 3 (three) times daily with meals. What changed: how much to take   OLANZapine 10 MG tablet Commonly known as: ZYPREXA TAKE 1 TABLET BY MOUTH EVERYDAY AT BEDTIME   omeprazole 20 MG capsule Commonly known as: PRILOSEC Take 20 mg by mouth daily.   ondansetron 8 MG tablet Commonly known as: ZOFRAN TAKE 1 TABLET (8 MG TOTAL) BY MOUTH  EVERY 8 (EIGHT) HOURS AS NEEDED FOR NAUSEA OR VOMITING. TAKE 1 TABLETS (8MG  TOTAL) BY MOUTH EVERY 8HRS AS NEEDED. START ON THE THIRD DAY AFTER CHEMOTHERAPY.   pravastatin 40 MG tablet Commonly known as: PRAVACHOL Take 1 tablet (40 mg total) by mouth daily.   QUEtiapine 400 MG tablet Commonly known as: SEROQUEL Take 800 mg by mouth at bedtime.        Allergies:  Allergies  Allergen Reactions   Lithium Other (See Comments)    Spinal fluid built up in brain   Dulaglutide Nausea And Vomiting and Other (See Comments)   Penicillin G Other (See Comments)   Penicillins Other (See Comments)    UNKNOWN CHILDHOOD REACTION    Past Medical History, Surgical history, Social history, and Family History were reviewed and updated.  Review of Systems: Review of Systems  Constitutional:  Positive for malaise/fatigue.  HENT: Negative.    Eyes: Negative.   Respiratory: Negative.    Cardiovascular:  Positive for leg swelling.  Gastrointestinal:  Positive for nausea.  Genitourinary: Negative.   Musculoskeletal:  Positive for joint pain and myalgias.  Skin: Negative.   Neurological:  Positive for tingling.  Endo/Heme/Allergies: Negative.   Psychiatric/Behavioral: Negative.       Physical Exam:  vitals were not taken for this visit.   Wt Readings from Last 3 Encounters:  12/02/22 185 lb (83.9 kg)  10/28/22 179 lb 1.3 oz (81.2 kg)  10/06/22 192 lb (87.1 kg)    Physical Exam Vitals reviewed.  HENT:     Head: Normocephalic and atraumatic.  Eyes:     Pupils: Pupils are equal, round, and reactive to light.  Cardiovascular:     Rate and Rhythm: Normal rate and regular rhythm.     Heart sounds: Normal heart sounds.  Pulmonary:     Effort: Pulmonary effort is normal.     Breath sounds: Normal breath sounds.  Abdominal:     General: Bowel sounds are normal.     Palpations: Abdomen is soft.  Musculoskeletal:        General: No tenderness or deformity. Normal range of motion.      Cervical back: Normal range of motion.  Lymphadenopathy:     Cervical: No cervical adenopathy.  Skin:    General: Skin is warm and dry.     Findings: No erythema or rash.     Comments: See below  Neurological:     Mental Status: She is alert and oriented to person, place, and time.  Psychiatric:        Behavior: Behavior normal.        Thought Content: Thought content normal.        Judgment: Judgment normal.  Lab Results  Component Value Date   WBC 5.2 12/02/2022   HGB 11.6 (L) 12/02/2022   HCT 34.5 (L) 12/02/2022   MCV 93.5 12/02/2022   PLT 269 12/02/2022   Lab Results  Component Value Date   FERRITIN 42 08/05/2022   IRON 57 08/05/2022   TIBC 360 08/05/2022   UIBC 303 08/05/2022   IRONPCTSAT 16 08/05/2022   Lab Results  Component Value Date   RETICCTPCT 4.0 (H) 08/05/2022   RBC 3.69 (L) 12/02/2022   No results found for: "KPAFRELGTCHN", "LAMBDASER", "KAPLAMBRATIO" No results found for: "IGGSERUM", "IGA", "IGMSERUM" No results found for: "TOTALPROTELP", "ALBUMINELP", "A1GS", "A2GS", "BETS", "BETA2SER", "GAMS", "MSPIKE", "SPEI"   Chemistry      Component Value Date/Time   NA 140 12/02/2022 1326   K 3.5 12/02/2022 1326   CL 107 12/02/2022 1326   CO2 24 12/02/2022 1326   BUN 15 12/02/2022 1326   CREATININE 0.74 12/02/2022 1326   CREATININE 0.92 10/06/2022 0940      Component Value Date/Time   CALCIUM 9.7 12/02/2022 1326   ALKPHOS 48 12/02/2022 1326   AST 23 12/02/2022 1326   AST 11 (L) 10/06/2022 0940   ALT 18 12/02/2022 1326   ALT 7 10/06/2022 0940   BILITOT 0.3 12/02/2022 1326   BILITOT 0.3 10/06/2022 0940       Impression and Plan: Ariana Herrera is a very pleasant  57 yo caucasian female with recurrent ductal carcinoma of the left breast.  She currently is off therapy.  She does not wish to have any further therapy. PET scans have been ok.  Her CA 27.29 has been trending down.   Her chronic off/on anemia has resolved. I am concerned about her  continued N/V though as she has been s/p treatment for quite some time. I have placed a referral to GI.   Due for PET scan in May/June however I am concerned with her left breast discomfort. I suggest that we repeat imaging sooner. She also wishes to have a referral to general surgery to discuss bilateral mastectomy as she states that she is constantly worries about recurrence and this would help her feel better plus given her chronic breast pain. In addition on exam a tick was found attached to her left elbow. Given her symptoms, recently increased time outdoors and with an engorged tick on her I have discussed treating with doxycyline to cover for tick bourne illness. She is agreeable. We reviewed sun precautions. If symptoms continues despite doxycyline treatment I have recommended blood work for fatigue by her PCP. She will continue her current B12 and vitamin D supplements.   I would see her back in 6 weeks.  She still has a Port-A-Cath and that we have to flush.  Doxycycline.   Referral to GI for her continued nausea/vomiting.   Referral to general surgery to discuss possible bilateral mastectomy.   Hughie Closs, PA-C 4/1/202411:05 AM

## 2023-01-24 NOTE — Patient Instructions (Signed)

## 2023-01-24 NOTE — Telephone Encounter (Signed)
-----   Message from Volanda Napoleon, MD sent at 01/24/2023 12:54 PM EDT ----- Please call and tell her that her blood sugar is horrible.  She really needs to get her blood sugar under better control.  She really needs to see her family doctor about this.  If not, she will start to have permanent complications from diabetes and we probably will never be able to treat her lung cancer if it recurs.  Thanks.  Laurey Arrow

## 2023-01-25 ENCOUNTER — Encounter: Payer: Self-pay | Admitting: Internal Medicine

## 2023-01-26 LAB — CANCER ANTIGEN 27.29: CA 27.29: 20.1 U/mL (ref 0.0–38.6)

## 2023-02-02 ENCOUNTER — Other Ambulatory Visit: Payer: Self-pay | Admitting: Hematology & Oncology

## 2023-02-02 ENCOUNTER — Other Ambulatory Visit (HOSPITAL_BASED_OUTPATIENT_CLINIC_OR_DEPARTMENT_OTHER): Payer: Self-pay

## 2023-02-02 DIAGNOSIS — M25512 Pain in left shoulder: Secondary | ICD-10-CM

## 2023-02-02 DIAGNOSIS — C50011 Malignant neoplasm of nipple and areola, right female breast: Secondary | ICD-10-CM

## 2023-02-02 MED ORDER — HYDROCODONE-ACETAMINOPHEN 7.5-325 MG PO TABS
1.0000 | ORAL_TABLET | Freq: Four times a day (QID) | ORAL | 0 refills | Status: DC | PRN
  Filled 2023-02-02: qty 120, 30d supply, fill #0

## 2023-02-07 ENCOUNTER — Ambulatory Visit (INDEPENDENT_AMBULATORY_CARE_PROVIDER_SITE_OTHER): Payer: Medicaid Other | Admitting: Internal Medicine

## 2023-02-07 ENCOUNTER — Encounter: Payer: Self-pay | Admitting: Internal Medicine

## 2023-02-07 VITALS — BP 110/58 | HR 108 | Ht 62.0 in | Wt 193.8 lb

## 2023-02-07 DIAGNOSIS — R112 Nausea with vomiting, unspecified: Secondary | ICD-10-CM

## 2023-02-07 DIAGNOSIS — K219 Gastro-esophageal reflux disease without esophagitis: Secondary | ICD-10-CM | POA: Diagnosis not present

## 2023-02-07 DIAGNOSIS — K5900 Constipation, unspecified: Secondary | ICD-10-CM

## 2023-02-07 MED ORDER — OMEPRAZOLE 20 MG PO CPDR: 20.0000 mg | DELAYED_RELEASE_CAPSULE | Freq: Every day | ORAL | 2 refills | Status: DC

## 2023-02-07 MED ORDER — LINACLOTIDE 145 MCG PO CAPS: 145.0000 ug | ORAL_CAPSULE | Freq: Every day | ORAL | 0 refills | Status: DC

## 2023-02-07 NOTE — Patient Instructions (Addendum)
You have been scheduled for an endoscopy. Please follow written instructions given to you at your visit today. If you use inhalers (even only as needed), please bring them with you on the day of your procedure.   We have sent the following medications to your pharmacy for you to pick up at your convenience: Linzess, Omeprazole  Work with your PCP to better control your blood sugars _______________________________________________________  If your blood pressure at your visit was 140/90 or greater, please contact your primary care physician to follow up on this.  _______________________________________________________  If you are age 21 or older, your body mass index should be between 23-30. Your Body mass index is 35.45 kg/m. If this is out of the aforementioned range listed, please consider follow up with your Primary Care Provider.  If you are age 62 or younger, your body mass index should be between 19-25. Your Body mass index is 35.45 kg/m. If this is out of the aformentioned range listed, please consider follow up with your Primary Care Provider.   ________________________________________________________  The [facility] providers would like to encourage you to use [facility] to communicate with providers for non-urgent requests or questions.  Due to long hold times on the telephone, sending your provider a message by [facility] may be a faster and more efficient way to get a response.  Please allow 48 business hours for a response.  Please remember that this is for non-urgent requests.  _______________________________________________________   Due to recent changes in healthcare laws, you may see the results of your imaging and laboratory studies on MyChart before your provider has had a chance to review them.  We understand that in some cases there may be results that are confusing or concerning to you. Not all laboratory results come back in the same time frame and the provider may be waiting  for multiple results in order to interpret others.  Please give [place] 48 hours in order for your provider to thoroughly review all the results before contacting the office for clarification of your results.    Drink 8 cups of water a day and walk 30 minutes a day.  Please purchase the following medications over the counter and take as directed: Fiber supplement such as Benefiber- use as directed daily  Thank you for entrusting me with your care and for choosing [facility], Dr. Eulah Pont

## 2023-02-07 NOTE — Progress Notes (Unsigned)
Chief Complaint: Nausea, GERD, and constipation  HPI : 57 year old female with history of infiltrating ductal carcinoma s/p chemotherapy, bipolar disorder, DM, prior DVT, CAD, OSA, obesity, OSA, prior pancreatitis presents with nausea, GERD, and constipation  She does have issues with nausea for which she takes Zofran. She vomits 4-5 days per week. She completed her chemotherapy in 10/2022, but her nausea issues have persisted. Her blood sugars have been fluctuating. Denies dysphagia or odynophagia. She does have acid reflux for which she takes omeprazole. The omeprazole does help with her GERD. If she misses a dose of omeprazole, then her GERD will be bad. She has had constipation for the last few years. She will have one BM once every 2-3 weeks. When she does go, she goes through for 3-4 days straight. She has tried Miralax, suppositories, and enemas, none of which have worked. She takes Norco PRN. Denies blood in the stools. Last colonoscopy was 2 years ago at [facility] that was normal, and she was recommended for 5 years later. She has rectal discomfort due to hemorrhoids. Weight has been up-and-down. Appetite has been good. Denies abdominal pain. Denies family history of GI issues. Last EGD was 10 years ago that was normal. Her prior pancreatitis was attributed to Trulicity. Denies headaches or dizziness. She tried marijuana a couple of months ago but has not used it since then.  Wt Readings from Last 3 Encounters:  02/07/23 193 lb 12.8 oz (87.9 kg)  01/24/23 187 lb (84.8 kg)  12/02/22 185 lb (83.9 kg)   Past Medical History:  Diagnosis Date   Anginal pain    Anxiety    Arthritis    Asthma    Bipolar 1 disorder    Bipolar disease, chronic    Breast cancer in female    2019 and recurrent in 2023 to right breast, right axillary, and left neck   Coronary artery disease    Deep vein blood clot of right lower extremity    Diabetes mellitus type 2 in obese    Goals of care,  counseling/discussion 11/23/2021   Hyperlipidemia    MI (myocardial infarction)    was in [place]   Obesity    Pancreatitis    Personal history of chemotherapy    Personal history of radiation therapy    Sleep apnea      Past Surgical History:  Procedure Laterality Date   BREAST LUMPECTOMY Left 03/2019   CATARACT EXTRACTION Bilateral    CHOLECYSTECTOMY     IR IMAGING GUIDED PORT INSERTION  12/15/2021   IR REMOVAL TUN ACCESS W/ PORT W/O FL MOD SED  12/20/2019   IR US GUIDE BX ASP/DRAIN  11/02/2021   LEFT HEART CATH AND CORONARY ANGIOGRAPHY N/A 08/22/2020   Procedure: LEFT HEART CATH AND CORONARY ANGIOGRAPHY;  Surgeon: Kathleene Hazel, MD;  Location: [facility];  Service: Cardiovascular;  Laterality: N/A;   TRIGGER FINGER RELEASE Bilateral    x 5   TUBAL LIGATION     UMBILICAL HERNIA REPAIR     x 2   uterine ablation     Family History  Problem Relation Age of Onset   Heart attack Mother 44   Diabetes Mother    Breast cancer Mother    Breast cancer Cousin    Colon cancer Neg Hx    Esophageal cancer Neg Hx    Pancreatic cancer Neg Hx    Stomach cancer Neg Hx    Social History   Tobacco  Use   Smoking status: Every Day    Packs/day: 1.00    Years: 36.00    Additional pack years: 0.00    Total pack years: 36.00    Types: Cigarettes    Start date: 11/26/2019   Smokeless tobacco: Never  Vaping Use   Vaping Use: Never used  Substance Use Topics   Alcohol use: Never   Drug use: Yes    Types: Marijuana    Comment: occasionally   Current Outpatient Medications  Medication Sig Dispense Refill   ACCU-CHEK GUIDE test strip 3 (three) times daily.     albuterol (VENTOLIN HFA) 108 (90 Base) MCG/ACT inhaler Inhale 2 puffs into the lungs every 6 (six) hours as needed for wheezing or shortness of breath.     Continuous Blood Gluc Sensor (FREESTYLE LIBRE 2 SENSOR) MISC      fenofibrate (TRICOR) 145 MG tablet Take 145 mg by mouth daily.      HYDROcodone-acetaminophen (NORCO) 7.5-325 MG tablet Take 1 tablet by mouth every 6 (six) hours as needed for moderate pain. 120 tablet 0   insulin aspart (NOVOLOG FLEXPEN) 100 UNIT/ML FlexPen Inject 3 Units into the skin 3 (three) times daily with meals. (Patient taking differently: Inject 10-16 Units into the skin 3 (three) times daily with meals.) 15 mL 0   insulin glargine (LANTUS) 100 UNIT/ML injection Inject 25 Units into the skin at bedtime.     metFORMIN (GLUCOPHAGE) 1000 MG tablet Take 1,000 mg by mouth 2 (two) times daily with a meal.     nitroGLYCERIN (NITROSTAT) 0.4 MG SL tablet Place 1 tablet (0.4 mg total) under the tongue every 5 (five) minutes as needed for chest pain. 25 tablet 3   OLANZapine (ZYPREXA) 10 MG tablet TAKE 1 TABLET BY MOUTH EVERYDAY AT BEDTIME 90 tablet 2   omeprazole (PRILOSEC) 20 MG capsule Take 20 mg by mouth daily.     pravastatin (PRAVACHOL) 40 MG tablet Take 1 tablet (40 mg total) by mouth daily. 90 tablet 3   QUEtiapine (SEROQUEL) 400 MG tablet Take 800 mg by mouth at bedtime.     No current facility-administered medications for this visit.   Facility-Administered Medications Ordered in Other Visits  Medication Dose Route Frequency Provider Last Rate Last Admin   sodium chloride flush (NS) 0.9 % injection 10 mL  10 mL Intravenous PRN [name], MD   10 mL at 02/02/22 0848   sodium chloride flush (NS) 0.9 % injection 10 mL  10 mL Intravenous PRN [name], MD   10 mL at 02/15/22 1219   sodium chloride flush (NS) 0.9 % injection 10 mL  10 mL Intravenous PRN [name], MD   10 mL at 04/13/22 1241   sodium chloride flush (NS) 0.9 % injection 10 mL  10 mL Intravenous PRN [name], MD   10 mL at 05/25/22 1214   Allergies  Allergen Reactions   Lithium Other (See Comments)    Spinal fluid built up in brain   Dulaglutide Nausea And Vomiting and Other (See Comments)   Penicillins Other (See Comments)    UNKNOWN CHILDHOOD REACTION      Review of Systems: All systems reviewed and negative except where noted in HPI.   Physical Exam: BP (!) 110/58   Pulse (!) 108   Ht 5\' 2"  (1.575 m)   Wt 193 lb 12.8 oz (87.9 kg)   LMP 10/04/2016 Comment: after "procedure to stop bleeding"  BMI 35.45 kg/m  Constitutional: Pleasant,well-developed, ***female in no acute distress. HEENT: Normocephalic and atraumatic. Conjunctivae are normal. No scleral icterus. Cardiovascular: Normal rate, regular rhythm.  Pulmonary/chest: Effort normal and breath sounds normal. No wheezing, rales or rhonchi. Abdominal: Soft, nondistended, nontender. Bowel sounds active throughout. There are no masses palpable. No hepatomegaly. Extremities: No edema Neurological: Alert and oriented to person place and time. Skin: Skin is warm and dry. No rashes noted. Psychiatric: Normal mood and affect. Behavior is normal.  Labs 01/2023: CBC nml. CMP with elevated glucose of 253 and elevated Cr of 1.08.   CT A/P 08/14/20: IMPRESSION: Vascular findings: 1. Negative for aortic aneurysm or dissection. 2. Small wedge-shaped areas of decreased enhancement within the inferior poles of the right kidney and left kidney, concerning for areas of focal renal infarction. Pyelonephritis could have a similar appearance. Correlation with urinalysis is recommended. 3. Focal 11 x 5 mm area of noncalcified atherosclerotic plaque with a slightly polypoid configuration projecting into the lumen of the distal descending thoracic aorta. This may be at risk for potential plaque thrombosis. Nonvascular findings: 1. Findings of acute uncomplicated pancreatitis. No evidence of pancreatic necrosis or peripancreatic fluid collection. Correlate with serum lipase. 2. Findings suggestive of hepatic steatosis. 3. Indeterminate 1.8 cm rounded area of hypoattenuation within the anterior aspect of the spleen. Further evaluation with contrast-enhanced MRI can be performed on a nonemergent basis. 4. Slightly  mottled appearance of the left third rib anteriorly with some adjacent scarring in the adjacent lung, potentially representing post radiation changes. 5. Left breast skin thickening with suspected lumpectomy site at the inner left breast. Continued mammographic follow-up is recommended.    PET CT 11/29/22: IMPRESSION: 1. No evidence breast cancer recurrence on FDG PET scan. 2. Postradiation change in the LEFT upper lobe is stable. 3. Stable small nodule in the RIGHT upper lobe. 4. No skeletal metastasis  ASSESSMENT AND PLAN: N&V GERD Constipation - Drink 8 cups of water per day, walk 30 minutes per day, and take daily fiber supplement - Start Linzess 145 mcg QD - GERD handout - Refill omeprazole 20 mg QD - Work with PCP about getting blood sugars under better control - EGD [facility] - Avoid marijuana use - Will get colonoscopy records from [facility] - Consider GES in the future  Eulah Pont, MD

## 2023-02-08 IMAGING — XA IR IMAGING GUIDED PORT INSERTION
1 series · 1 of 1 positions shown · non-contrast
Comparison: none

INDICATION: Breast cancer recurrent.

[Series 1: ir fluoro/shunt/fist · 1 of 1 slices shown]
[im 1/1]
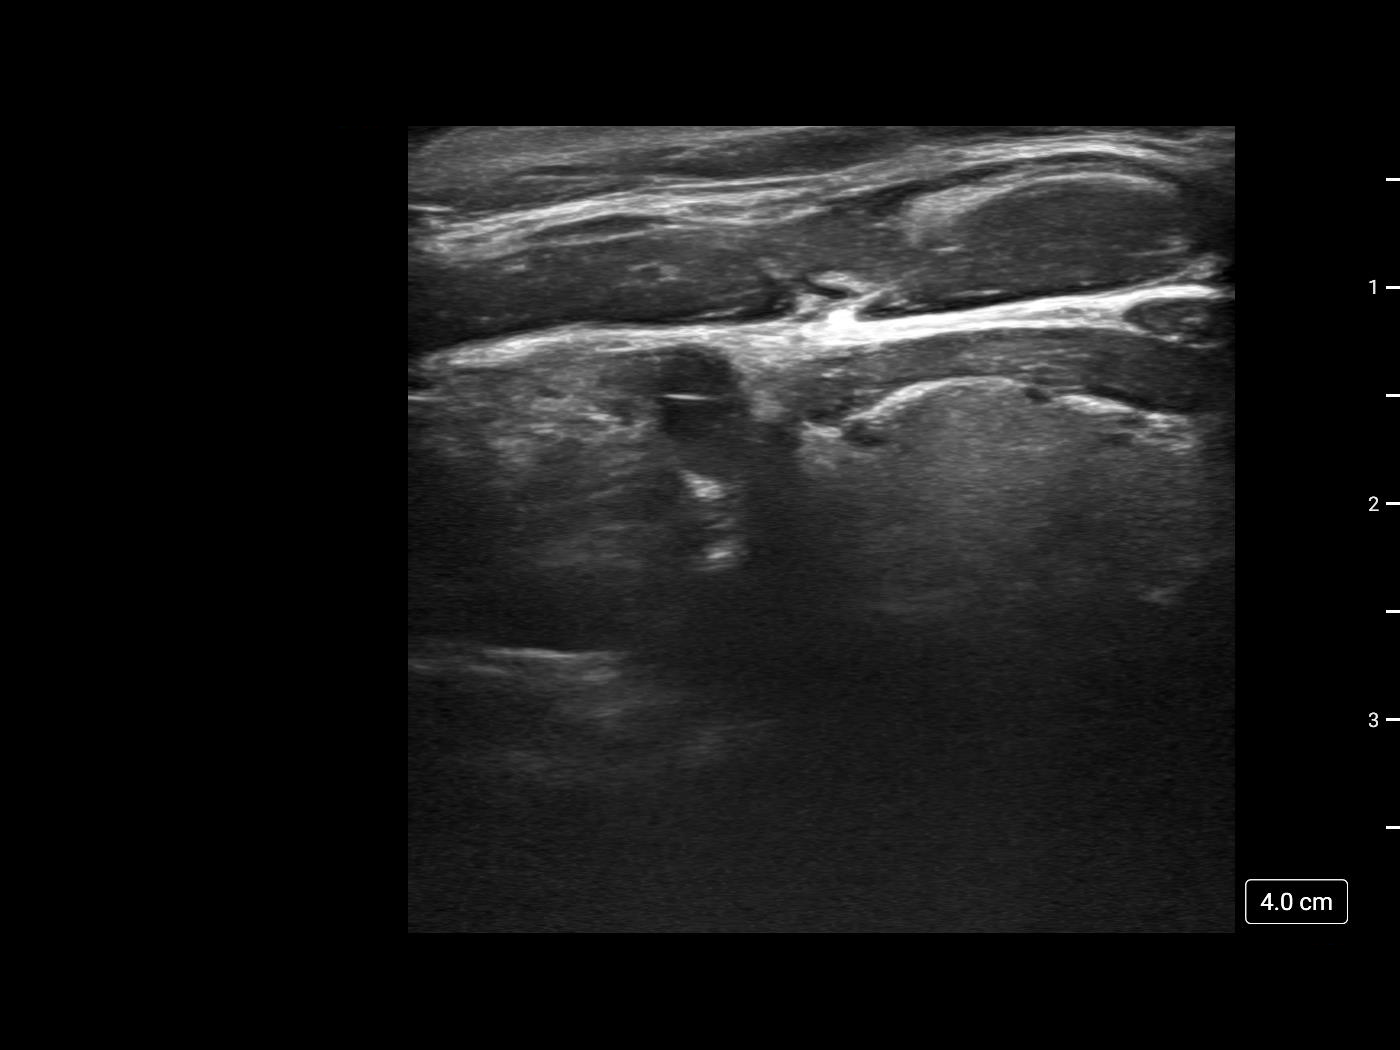

[1 of 1 positions shown; findings below may reference images not displayed]

EXAM:
IMPLANTED PORT A CATH PLACEMENT WITH ULTRASOUND AND FLUOROSCOPIC
GUIDANCE

MEDICATIONS:
None

ANESTHESIA/SEDATION:
Moderate (conscious) sedation was employed during this procedure. A
total of Versed 4 mg and Fentanyl 200 mcg was administered
intravenously.

Moderate Sedation Time: 29 minutes. The patient's level of
consciousness and vital signs were monitored continuously by
radiology nursing throughout the procedure under my direct
supervision.

FLUOROSCOPY TIME:  0 minutes, 54 seconds (1 mGy)

COMPLICATIONS:
None immediate.

PROCEDURE:
The procedure, risks, benefits, and alternatives were explained to
the patient. Questions regarding the procedure were encouraged and
answered. The patient understands and consents to the procedure.

The RIGHT neck and chest were prepped with chlorhexidine in a
sterile fashion, and a sterile drape was applied covering the
operative field. Maximum barrier sterile technique with sterile
gowns and gloves were used for the procedure. A timeout was
performed prior to the initiation of the procedure. Local anesthesia
was provided with 1% lidocaine with epinephrine.

After creating a small venotomy incision, a micropuncture kit was
utilized to access the internal jugular vein under direct, real-time
ultrasound guidance. Ultrasound image documentation was performed.
The microwire was kinked to measure appropriate catheter length.

A subcutaneous port pocket was then created along the upper chest
wall utilizing a combination of sharp and blunt dissection. The
pocket was irrigated with sterile saline. A single lumen ISP power
injectable port was chosen for placement. The 8 Fr catheter was
tunneled from the port pocket site to the venotomy incision. The
port was placed in the pocket. The external catheter was trimmed to
appropriate length. At the venotomy, an 8 Fr peel-away sheath was
placed over a guidewire under fluoroscopic guidance. The catheter
was then placed through the sheath and the sheath was removed. Final
catheter positioning was confirmed and documented with a
fluoroscopic spot radiograph. The port was accessed with Beroos Gazdek
needle, aspirated and flushed with heparinized saline.

The port pocket incision was closed with interrupted 3-0 Vicryl
suture then Dermabond was applied, including at the venotomy
incision. Dressings were placed. The patient tolerated the procedure
well without immediate post procedural complication.
FINDINGS: Diminutive RIGHT IJ with calcified fibrin sheath.
IMPRESSION: Successful placement of a RIGHT internal jugular approach power
injectable Port-A-Cath.

The tip of the catheter is positioned within the proximal RIGHT
atrium. The catheter is ready for immediate use.

## 2023-02-14 ENCOUNTER — Encounter: Payer: Self-pay | Admitting: *Deleted

## 2023-02-22 ENCOUNTER — Encounter: Payer: Self-pay | Admitting: *Deleted

## 2023-02-22 ENCOUNTER — Encounter: Payer: Self-pay | Admitting: Hematology & Oncology

## 2023-02-22 DIAGNOSIS — C50011 Malignant neoplasm of nipple and areola, right female breast: Secondary | ICD-10-CM

## 2023-02-22 NOTE — Telephone Encounter (Signed)
Called pt, discussed Referral to Physical Therapy/Lymphedema clinic. Instructed pt to elevate arm/breast, stop doing exercises as this can increase swelling and pain, pt to use warm compress

## 2023-03-02 ENCOUNTER — Other Ambulatory Visit: Payer: Self-pay | Admitting: Hematology & Oncology

## 2023-03-02 ENCOUNTER — Other Ambulatory Visit (HOSPITAL_BASED_OUTPATIENT_CLINIC_OR_DEPARTMENT_OTHER): Payer: Self-pay

## 2023-03-02 DIAGNOSIS — M25512 Pain in left shoulder: Secondary | ICD-10-CM

## 2023-03-02 DIAGNOSIS — C50011 Malignant neoplasm of nipple and areola, right female breast: Secondary | ICD-10-CM

## 2023-03-02 MED ORDER — HYDROCODONE-ACETAMINOPHEN 7.5-325 MG PO TABS
1.0000 | ORAL_TABLET | Freq: Four times a day (QID) | ORAL | 0 refills | Status: DC | PRN
  Filled 2023-03-02: qty 120, 30d supply, fill #0

## 2023-03-04 ENCOUNTER — Encounter (HOSPITAL_COMMUNITY)
Admission: RE | Admit: 2023-03-04 | Discharge: 2023-03-04 | Disposition: A | Discharge status: Home / Self Care | Payer: Medicaid Other | Source: Ambulatory Visit | Attending: Medical Oncology | Admitting: Medical Oncology

## 2023-03-04 ENCOUNTER — Other Ambulatory Visit: Payer: Self-pay

## 2023-03-04 ENCOUNTER — Other Ambulatory Visit: Payer: Self-pay | Admitting: Hematology & Oncology

## 2023-03-04 DIAGNOSIS — Z171 Estrogen receptor negative status [ER-]: Secondary | ICD-10-CM

## 2023-03-04 DIAGNOSIS — C50212 Malignant neoplasm of upper-inner quadrant of left female breast: Secondary | ICD-10-CM | POA: Diagnosis present

## 2023-03-04 DIAGNOSIS — C50011 Malignant neoplasm of nipple and areola, right female breast: Secondary | ICD-10-CM | POA: Insufficient documentation

## 2023-03-04 LAB — GLUCOSE, CAPILLARY: Glucose-Capillary: 146 mg/dL — ABNORMAL HIGH (ref 70–99)

## 2023-03-04 MED ORDER — FLUDEOXYGLUCOSE F - 18 (FDG) INJECTION
8.6000 | Freq: Once | INTRAVENOUS | Status: AC | PRN
  Administered 2023-03-04: 9.6 via INTRAVENOUS

## 2023-03-07 ENCOUNTER — Encounter: Payer: Self-pay | Admitting: Hematology & Oncology

## 2023-03-07 ENCOUNTER — Inpatient Hospital Stay: Payer: Medicaid Other | Attending: Hematology & Oncology

## 2023-03-07 ENCOUNTER — Inpatient Hospital Stay: Payer: Medicaid Other

## 2023-03-07 ENCOUNTER — Other Ambulatory Visit: Payer: Self-pay

## 2023-03-07 ENCOUNTER — Inpatient Hospital Stay (HOSPITAL_BASED_OUTPATIENT_CLINIC_OR_DEPARTMENT_OTHER): Payer: Medicaid Other | Admitting: Hematology & Oncology

## 2023-03-07 VITALS — BP 131/53 | HR 93 | Temp 97.9°F | Resp 20 | Ht 62.0 in | Wt 193.0 lb

## 2023-03-07 DIAGNOSIS — R112 Nausea with vomiting, unspecified: Secondary | ICD-10-CM

## 2023-03-07 DIAGNOSIS — Z171 Estrogen receptor negative status [ER-]: Secondary | ICD-10-CM | POA: Insufficient documentation

## 2023-03-07 DIAGNOSIS — Z170 Estrogen receptor positive status [ER+]: Secondary | ICD-10-CM | POA: Diagnosis not present

## 2023-03-07 DIAGNOSIS — C50019 Malignant neoplasm of nipple and areola, unspecified female breast: Secondary | ICD-10-CM

## 2023-03-07 DIAGNOSIS — Z95828 Presence of other vascular implants and grafts: Secondary | ICD-10-CM

## 2023-03-07 DIAGNOSIS — C50011 Malignant neoplasm of nipple and areola, right female breast: Secondary | ICD-10-CM

## 2023-03-07 DIAGNOSIS — F17210 Nicotine dependence, cigarettes, uncomplicated: Secondary | ICD-10-CM | POA: Diagnosis not present

## 2023-03-07 DIAGNOSIS — C50212 Malignant neoplasm of upper-inner quadrant of left female breast: Secondary | ICD-10-CM

## 2023-03-07 DIAGNOSIS — C50912 Malignant neoplasm of unspecified site of left female breast: Secondary | ICD-10-CM | POA: Diagnosis present

## 2023-03-07 LAB — CBC WITH DIFFERENTIAL (CANCER CENTER ONLY)
Abs Immature Granulocytes: 0.02 10*3/uL (ref 0.00–0.07)
Basophils Absolute: 0 10*3/uL (ref 0.0–0.1)
Basophils Relative: 0 %
Eosinophils Absolute: 0 10*3/uL (ref 0.0–0.5)
Eosinophils Relative: 1 %
HCT: 34.3 % — ABNORMAL LOW (ref 36.0–46.0)
Hemoglobin: 11.6 g/dL — ABNORMAL LOW (ref 12.0–15.0)
Immature Granulocytes: 0 %
Lymphocytes Relative: 23 %
Lymphs Abs: 1.4 10*3/uL (ref 0.7–4.0)
MCH: 31.1 pg (ref 26.0–34.0)
MCHC: 33.8 g/dL (ref 30.0–36.0)
MCV: 92 fL (ref 80.0–100.0)
Monocytes Absolute: 0.5 10*3/uL (ref 0.1–1.0)
Monocytes Relative: 8 %
Neutro Abs: 4 10*3/uL (ref 1.7–7.7)
Neutrophils Relative %: 68 %
Platelet Count: 247 10*3/uL (ref 150–400)
RBC: 3.73 MIL/uL — ABNORMAL LOW (ref 3.87–5.11)
RDW: 14.1 % (ref 11.5–15.5)
WBC Count: 6 10*3/uL (ref 4.0–10.5)
nRBC: 0 % (ref 0.0–0.2)

## 2023-03-07 LAB — CMP (CANCER CENTER ONLY)
ALT: 14 U/L (ref 0–44)
AST: 14 U/L — ABNORMAL LOW (ref 15–41)
Albumin: 4.2 g/dL (ref 3.5–5.0)
Alkaline Phosphatase: 51 U/L (ref 38–126)
Anion gap: 7 (ref 5–15)
BUN: 13 mg/dL (ref 6–20)
CO2: 25 mmol/L (ref 22–32)
Calcium: 9.5 mg/dL (ref 8.9–10.3)
Chloride: 104 mmol/L (ref 98–111)
Creatinine: 0.99 mg/dL (ref 0.44–1.00)
GFR, Estimated: >60 mL/min (ref >60)
Glucose, Bld: 217 mg/dL — ABNORMAL HIGH (ref 70–99)
Potassium: 4.1 mmol/L (ref 3.5–5.1)
Sodium: 136 mmol/L (ref 135–145)
Total Bilirubin: 0.3 mg/dL (ref 0.3–1.2)
Total Protein: 6.7 g/dL (ref 6.5–8.1)

## 2023-03-07 LAB — LACTATE DEHYDROGENASE: LDH: 156 U/L (ref 98–192)

## 2023-03-07 MED ORDER — HEPARIN SOD (PORK) LOCK FLUSH 100 UNIT/ML IV SOLN
500.0000 [IU] | Freq: Once | INTRAVENOUS | Status: AC
  Administered 2023-03-07: 500 [IU] via INTRAVENOUS

## 2023-03-07 MED ORDER — SODIUM CHLORIDE 0.9% FLUSH
10.0000 mL | INTRAVENOUS | Status: DC | PRN
  Administered 2023-03-07: 10 mL via INTRAVENOUS

## 2023-03-07 MED ORDER — ALTEPLASE 2 MG IJ SOLR
2.0000 mg | Freq: Once | INTRAMUSCULAR | Status: AC
  Administered 2023-03-07: 2 mg
  Filled 2023-03-07: qty 2

## 2023-03-07 NOTE — Progress Notes (Signed)
Hematology and Oncology Follow Up Visit  Ariana Herrera [id number] 1965-11-13 57 y.o. 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  Right now, the problem that she is having is that there is swelling and pain with the left breast.  She says it started about 2 weeks ago.  She really wants to have a mastectomy.  She says the breast has caused her problems on and off for quite a while.  She has had no fever.  She has had no problems with nausea or vomiting.  There is been no change in bowel or bladder habits.  She is still smoking.  I think she still smokes close to a pack a day.  She has diabetes.  Is hard to say how well her diabetes is controlled.  Back in April, her CA 27.29 was 20.1 which is stable.  She did have a PET scan this past Friday.  Unfortunately, we still do not have the results back yet.  She has had no headache.  She has a little bit of pain with the left knee.  Overall, I would have said that her performance status is probably ECOG 1.   Medications:  Allergies as of 03/07/2023       Reactions   Lithium Other (See Comments)   Spinal fluid built up in brain   Dulaglutide Nausea And Vomiting, Other (See Comments)   Penicillins Other (See Comments)   UNKNOWN CHILDHOOD REACTION        Medication List        Accurate as of Mar 07, 2023 11:36 AM. If you have any questions, ask your nurse or doctor.          STOP taking these medications    FreeStyle Libre 2 Sensor Misc Stopped by: [name], MD       TAKE these medications    Accu-Chek Guide  test strip Generic drug: glucose blood 3 (three) times daily.   albuterol 108 (90 Base) MCG/ACT inhaler Commonly known as: VENTOLIN HFA Inhale 2 puffs into the lungs every 6 (six) hours as needed for wheezing or shortness of breath.   albuterol 1.25 MG/3ML nebulizer solution Commonly known as: ACCUNEB Take 1 ampule by nebulization 4 (four) times daily as needed for wheezing or shortness of breath.   fenofibrate 145 MG tablet Commonly known as: TRICOR Take 145 mg by mouth daily.   FreeStyle Libre 3 Reader Devi Inject 1 kit into the skin every 14 (fourteen) days.   HYDROcodone-acetaminophen 7.5-325 MG tablet Commonly known as: Norco Take 1 tablet by mouth every 6 (six) hours as needed for moderate pain.   insulin glargine 100 UNIT/ML injection Commonly known as: LANTUS Inject 25 Units into the skin at bedtime.   linaclotide 145 MCG Caps capsule Commonly known as: Linzess Take 1 capsule (145 mcg total) by mouth daily before breakfast.   metFORMIN 1000 MG tablet Commonly known as: GLUCOPHAGE Take 1,000 mg by mouth 2 (two) times daily with a meal.   nitroGLYCERIN 0.4 MG SL tablet Commonly known as: NITROSTAT Place 1 tablet (0.4 mg total) under  the tongue every 5 (five) minutes as needed for chest pain.   NovoLOG FlexPen 100 UNIT/ML FlexPen Generic drug: insulin aspart Inject 3 Units into the skin 3 (three) times daily with meals. What changed: how much to take   OLANZapine 10 MG tablet Commonly known as: ZYPREXA TAKE 1 TABLET BY MOUTH EVERYDAY AT BEDTIME   omeprazole 20 MG capsule Commonly known as: PRILOSEC Take 1 capsule (20 mg total) by mouth daily.   ondansetron 8 MG tablet Commonly known as: ZOFRAN TAKE 1 TABLET (8 MG TOTAL) BY MOUTH EVERY 8 (EIGHT) HOURS AS NEEDED FOR NAUSEA OR VOMITING. TAKE 1 TABLETS (8MG  TOTAL) BY MOUTH EVERY 8HRS AS NEEDED. START ON THE THIRD DAY AFTER CHEMOTHERAPY.   pravastatin 40 MG tablet Commonly known as: PRAVACHOL Take 1 tablet  (40 mg total) by mouth daily.   QUEtiapine 400 MG tablet Commonly known as: SEROQUEL Take 800 mg by mouth at bedtime.   Evaristo Bury FlexTouch 100 UNIT/ML FlexTouch Pen Generic drug: insulin degludec Inject 25 Units into the skin daily.        Allergies:  Allergies  Allergen Reactions   Lithium Other (See Comments)    Spinal fluid built up in brain   Dulaglutide Nausea And Vomiting and Other (See Comments)   Penicillins Other (See Comments)    UNKNOWN CHILDHOOD REACTION    Past Medical History, Surgical history, Social history, and Family History were reviewed and updated.  Review of Systems: Review of Systems  Constitutional:  Positive for malaise/fatigue.  HENT: Negative.    Eyes: Negative.   Respiratory: Negative.    Cardiovascular:  Positive for leg swelling.  Gastrointestinal:  Positive for nausea.  Genitourinary: Negative.   Musculoskeletal:  Positive for joint pain and myalgias.  Skin: Negative.   Neurological:  Positive for tingling.  Endo/Heme/Allergies: Negative.   Psychiatric/Behavioral: Negative.       Physical Exam:  height is 5\' 2"  (1.575 m) and weight is 193 lb (87.5 kg). Her oral temperature is 97.9 F (36.6 C). Her blood pressure is 131/53 (abnormal) and her pulse is 93. Her respiration is 20 and oxygen saturation is 96%.   Wt Readings from Last 3 Encounters:  03/07/23 193 lb (87.5 kg)  02/07/23 193 lb 12.8 oz (87.9 kg)  01/24/23 187 lb (84.8 kg)    Physical Exam Vitals reviewed.  Constitutional:      Comments: On her breast exam, the right breast is without masses, edema or erythema.  There is no right axillary adenopathy.  Her left breast is somewhat contracted from radiation and surgery.  It is somewhat firm.  It is somewhat swollen although still contracted.  It is tender.  I cannot palpate any adenopathy in the left axilla.  There is no left nipple discharge.  HENT:     Head: Normocephalic and atraumatic.  Eyes:     Pupils: Pupils are  equal, round, and reactive to light.  Cardiovascular:     Rate and Rhythm: Normal rate and regular rhythm.     Heart sounds: Normal heart sounds.  Pulmonary:     Effort: Pulmonary effort is normal.     Breath sounds: Normal breath sounds.  Abdominal:     General: Bowel sounds are normal.     Palpations: Abdomen is soft.  Musculoskeletal:        General: No tenderness or deformity. Normal range of motion.     Cervical back: Normal range of motion.  Lymphadenopathy:     Cervical: No cervical adenopathy.  Skin:    General: Skin is warm and dry.     Findings: No erythema or rash.  Neurological:     Mental Status: She is alert and oriented to person, place, and time.  Psychiatric:        Behavior: Behavior normal.        Thought Content: Thought content normal.        Judgment: Judgment normal.     Lab Results  Component Value Date   WBC 6.0 03/07/2023   HGB 11.6 (L) 03/07/2023   HCT 34.3 (L) 03/07/2023   MCV 92.0 03/07/2023   PLT 247 03/07/2023   Lab Results  Component Value Date   FERRITIN 42 08/05/2022   IRON 57 08/05/2022   TIBC 360 08/05/2022   UIBC 303 08/05/2022   IRONPCTSAT 16 08/05/2022   Lab Results  Component Value Date   RETICCTPCT 4.0 (H) 08/05/2022   RBC 3.73 (L) 03/07/2023   No results found for: "KPAFRELGTCHN", "LAMBDASER", "KAPLAMBRATIO" No results found for: "IGGSERUM", "IGA", "IGMSERUM" No results found for: "TOTALPROTELP", "ALBUMINELP", "A1GS", "A2GS", "BETS", "BETA2SER", "GAMS", "MSPIKE", "SPEI"   Chemistry      Component Value Date/Time   NA 136 03/07/2023 1018   K 4.1 03/07/2023 1018   CL 104 03/07/2023 1018   CO2 25 03/07/2023 1018   BUN 13 03/07/2023 1018   CREATININE 0.99 03/07/2023 1018      Component Value Date/Time   CALCIUM 9.5 03/07/2023 1018   ALKPHOS 51 03/07/2023 1018   AST 14 (L) 03/07/2023 1018   ALT 14 03/07/2023 1018   BILITOT 0.3 03/07/2023 1018       Impression and Plan: Ms. Reason is a very pleasant  57 yo  caucasian female with recurrent ductal carcinoma of the left breast.  She currently is off therapy.  She did not wish to have any further therapy.  Again, this was fairly reasonable.    As much as I would hate to say this, the left breast certainly looks like inflammatory breast cancer.  However, given the that she has had radiation and chemotherapy surgery, it is hard to say if that is the case.  Again the PET scan will help [place] out.  I will have to speak with one of our surgeons.  Again she wants to have a mastectomy.  I truly understand this.  Will be interesting to see what her CA 27.29 looks like.  It is hard to say when to get her back.  I think this will be really dependent upon what surgery has to say.  I would really hate for her to have inflammatory breast cancer.  Of note, her last chemotherapy was back in December.  Her CA 27.29 has always been normal.  I suppose this might be some kind of lymphedema in the left breast.    [name], MD 5/13/202411:36 AM

## 2023-03-09 LAB — CANCER ANTIGEN 27.29: CA 27.29: 31.9 U/mL (ref 0.0–38.6)

## 2023-03-14 ENCOUNTER — Encounter: Payer: Self-pay | Admitting: *Deleted

## 2023-03-14 ENCOUNTER — Telehealth: Payer: Self-pay | Admitting: *Deleted

## 2023-03-14 DIAGNOSIS — C50019 Malignant neoplasm of nipple and areola, unspecified female breast: Secondary | ICD-10-CM

## 2023-03-14 NOTE — Progress Notes (Signed)
Patient wants to see a surgeon to discuss mastectomy. Ariana Myna Hidalgo reached out to Ariana Donell Beers with the following response:  There's going to be a problem since she has [facility], which is not contracted.  I am not seeing that she does not have out-of-network coverage, but it would be beneficial if a referral is placed by her PCP to this office as an override request for medically necessary care.  Spoke to the breast navigators at [facility] and they stated that with their patients with same insurance, they have referred to  Ariana Tonita Cong [address],  [place], [place] [address] [phone]  Called and spoke to patient. She is okay with me referring to Ariana Herrera for surgical consideration.   Also called Tori, the nurse navigator at that office, to help expedite the referral. Message left.  Oncology Nurse Navigator Documentation     03/14/2023    1:00 PM  Oncology Nurse Navigator Flowsheets  Navigator Location [facility]  Navigator Encounter Type Telephone  Patient Visit Type MedOnc  Treatment Phase Post-Tx Follow-up  Interventions Referrals  Referrals Other  Time Spent with Patient 30

## 2023-03-14 NOTE — Telephone Encounter (Signed)
Faxed referral to Dr. Tonita Cong @ [phone]

## 2023-03-16 ENCOUNTER — Encounter: Payer: Self-pay | Admitting: *Deleted

## 2023-03-22 ENCOUNTER — Encounter: Payer: Self-pay | Admitting: Internal Medicine

## 2023-03-22 ENCOUNTER — Ambulatory Visit (AMBULATORY_SURGERY_CENTER): Payer: Medicaid Other | Admitting: Internal Medicine

## 2023-03-22 VITALS — BP 132/77 | HR 91 | Temp 98.1°F | Resp 16 | Ht 62.0 in | Wt 193.0 lb

## 2023-03-22 DIAGNOSIS — R112 Nausea with vomiting, unspecified: Secondary | ICD-10-CM

## 2023-03-22 DIAGNOSIS — K219 Gastro-esophageal reflux disease without esophagitis: Secondary | ICD-10-CM

## 2023-03-22 DIAGNOSIS — K449 Diaphragmatic hernia without obstruction or gangrene: Secondary | ICD-10-CM

## 2023-03-22 DIAGNOSIS — R12 Heartburn: Secondary | ICD-10-CM

## 2023-03-22 DIAGNOSIS — K319 Disease of stomach and duodenum, unspecified: Secondary | ICD-10-CM | POA: Diagnosis not present

## 2023-03-22 MED ORDER — SODIUM CHLORIDE 0.9 % IV SOLN
500.0000 mL | INTRAVENOUS | Status: DC
Start: 2023-03-22 — End: 2023-03-22

## 2023-03-22 MED ORDER — OMEPRAZOLE 40 MG PO CPDR
40.0000 mg | DELAYED_RELEASE_CAPSULE | Freq: Two times a day (BID) | ORAL | 3 refills | Status: DC
Start: 2023-03-22 — End: 2023-05-13

## 2023-03-22 NOTE — Patient Instructions (Signed)
YOU HAD AN ENDOSCOPIC PROCEDURE TODAY AT THE [facility]:   Refer to the procedure report that was given to you for any specific questions about what was found during the examination.  If the procedure report does not answer your questions, please call your gastroenterologist to clarify.  If you requested that your care partner not be given the details of your procedure findings, then the procedure report has been included in a sealed envelope for you to review at your convenience later.  YOU SHOULD EXPECT: Some feelings of bloating in the abdomen. Passage of more gas than usual.  Walking can help get rid of the air that was put into your GI tract during the procedure and reduce the bloating. If you had a lower endoscopy (such as a colonoscopy or flexible sigmoidoscopy) you may notice spotting of blood in your stool or on the toilet paper. If you underwent a bowel prep for your procedure, you may not have a normal bowel movement for a few days.  Please Note:  You might notice some irritation and congestion in your nose or some drainage.  This is from the oxygen used during your procedure.  There is no need for concern and it should clear up in a day or so.  SYMPTOMS TO REPORT IMMEDIATELY:   Following upper endoscopy (EGD)  Vomiting of blood or coffee ground material  New chest pain or pain under the shoulder blades  Painful or persistently difficult swallowing  New shortness of breath  Fever of 100F or higher  Black, tarry-looking stools  For urgent or emergent issues, a gastroenterologist can be reached at any hour by calling [phone]. Do not use MyChart messaging for urgent concerns.    DIET:  We do recommend a small meal at first, but then you may proceed to your regular diet.  Drink plenty of fluids but you should avoid alcoholic beverages for 24 hours.  ACTIVITY:  You should plan to take it easy for the rest of today and you should NOT DRIVE or use heavy machinery  until tomorrow (because of the sedation medicines used during the test).    FOLLOW UP: Our staff will call the number listed on your records the next business day following your procedure.  We will call around 7:15- 8:00 am to check on you and address any questions or concerns that you may have regarding the information given to you following your procedure. If we do not reach you, we will leave a message.     If any biopsies were taken you will be contacted by phone or by letter within the next 1-3 weeks.  Please call us at [phone] if you have not heard about the biopsies in 3 weeks.    SIGNATURES/CONFIDENTIALITY: You and/or your care partner have signed paperwork which will be entered into your electronic medical record.  These signatures attest to the fact that that the information above on your After Visit Summary has been reviewed and is understood.  Full responsibility of the confidentiality of this discharge information lies with you and/or your care-partner. 

## 2023-03-22 NOTE — Progress Notes (Signed)
Patient reports new lymphedema since office visit, no new medications

## 2023-03-22 NOTE — Progress Notes (Signed)
GASTROENTEROLOGY PROCEDURE H&P NOTE   Primary Care Physician: Maye Hides, PA    Reason for Procedure:   N&V, GERD, history of breast cancer  Plan:    EGD  Patient is appropriate for endoscopic procedure(s) in the ambulatory ([facility]) setting.  The nature of the procedure, as well as the risks, benefits, and alternatives were carefully and thoroughly reviewed with the patient. Ample time for discussion and questions allowed. The patient understood, was satisfied, and agreed to proceed.     HPI: Ariana Herrera is a 57 y.o. female who presents for EGD for evaluation of N&V, GERD, history of breast cancer.  Patient was most recently seen in the Gastroenterology Clinic on 02/07/23.  No interval change in medical history since that appointment. Please refer to that note for full details regarding GI history and clinical presentation.   Past Medical History:  Diagnosis Date   Anginal pain (HCC)    Anxiety    Arthritis    Asthma    Bipolar 1 disorder (HCC)    Bipolar disease, chronic (HCC)    Breast cancer in female (HCC)    2019 and recurrent in 2023 to right breast, right axillary, and left neck   Coronary artery disease    Deep vein blood clot of right lower extremity (HCC)    Diabetes mellitus type 2 in obese    Goals of care, counseling/discussion 11/23/2021   Hyperlipidemia    MI (myocardial infarction) (HCC)    was in [place]   Obesity    Pancreatitis    Personal history of chemotherapy    Personal history of radiation therapy    Sleep apnea     Past Surgical History:  Procedure Laterality Date   BREAST LUMPECTOMY Left 03/2019   CATARACT EXTRACTION Bilateral    CHOLECYSTECTOMY     IR IMAGING GUIDED PORT INSERTION  12/15/2021   IR REMOVAL TUN ACCESS W/ PORT W/O FL MOD SED  12/20/2019   IR US GUIDE BX ASP/DRAIN  11/02/2021   LEFT HEART CATH AND CORONARY ANGIOGRAPHY N/A 08/22/2020   Procedure: LEFT HEART CATH AND CORONARY ANGIOGRAPHY;  Surgeon: Kathleene Hazel, MD;  Location: [facility];  Service: Cardiovascular;  Laterality: N/A;   TRIGGER FINGER RELEASE Bilateral    x 5   TUBAL LIGATION     UMBILICAL HERNIA REPAIR     x 2   uterine ablation      Prior to Admission medications   Medication Sig Start Date End Date Taking? Authorizing Provider  ACCU-CHEK GUIDE test strip 3 (three) times daily. 07/17/21   [provider]  albuterol (ACCUNEB) 1.25 MG/3ML nebulizer solution Take 1 ampule by nebulization 4 (four) times daily as needed for wheezing or shortness of breath. 02/27/23   [provider]  albuterol (VENTOLIN HFA) 108 (90 Base) MCG/ACT inhaler Inhale 2 puffs into the lungs every 6 (six) hours as needed for wheezing or shortness of breath.    [provider]  Continuous Glucose Receiver (FREESTYLE LIBRE 3 READER) DEVI Inject 1 kit into the skin every 14 (fourteen) days. 01/25/23   [provider]  fenofibrate (TRICOR) 145 MG tablet Take 145 mg by mouth daily. 11/22/21   [provider]  HYDROcodone-acetaminophen (NORCO) 7.5-325 MG tablet Take 1 tablet by mouth every 6 (six) hours as needed for moderate pain. 03/02/23   [name], MD  insulin aspart (NOVOLOG FLEXPEN) 100 UNIT/ML FlexPen Inject 3 Units into the skin 3 (three)  times daily with meals. Patient taking differently: Inject 10-16 Units into the skin 3 (three) times daily with meals. 08/25/20   Calvert Cantor, MD  insulin degludec (TRESIBA FLEXTOUCH) 100 UNIT/ML FlexTouch Pen Inject 25 Units into the skin daily. Patient not taking: Reported on 03/07/2023 02/10/23   [provider]  insulin glargine (LANTUS) 100 UNIT/ML injection Inject 25 Units into the skin at bedtime.    [provider]  linaclotide Karlene Einstein) 145 MCG CAPS capsule Take 1 capsule (145 mcg total) by mouth daily before breakfast. 02/07/23 02/02/24  Imogene Burn, MD  metFORMIN (GLUCOPHAGE) 1000 MG tablet Take 1,000 mg by mouth 2 (two) times daily  with a meal. 11/26/19   [provider]  nitroGLYCERIN (NITROSTAT) 0.4 MG SL tablet Place 1 tablet (0.4 mg total) under the tongue every 5 (five) minutes as needed for chest pain. Patient not taking: Reported on 03/07/2023 09/04/20   Ronney Asters, NP  OLANZapine (ZYPREXA) 10 MG tablet TAKE 1 TABLET BY MOUTH EVERYDAY AT BEDTIME 12/15/21   [name], MD  omeprazole (PRILOSEC) 20 MG capsule Take 1 capsule (20 mg total) by mouth daily. 02/07/23   Imogene Burn, MD  ondansetron (ZOFRAN) 8 MG tablet TAKE 1 TABLET (8 MG TOTAL) BY MOUTH EVERY 8 (EIGHT) HOURS AS NEEDED FOR NAUSEA OR VOMITING. TAKE 1 TABLETS (8MG  TOTAL) BY MOUTH EVERY 8HRS AS NEEDED. START ON THE THIRD DAY AFTER CHEMOTHERAPY. 03/04/23   [name], MD  pravastatin (PRAVACHOL) 40 MG tablet Take 1 tablet (40 mg total) by mouth daily. 09/10/22   Hilty, Lisette Abu, MD  QUEtiapine (SEROQUEL) 400 MG tablet Take 800 mg by mouth at bedtime.    [provider]    Current Outpatient Medications  Medication Sig Dispense Refill   ACCU-CHEK GUIDE test strip 3 (three) times daily.     albuterol (ACCUNEB) 1.25 MG/3ML nebulizer solution Take 1 ampule by nebulization 4 (four) times daily as needed for wheezing or shortness of breath.     albuterol (VENTOLIN HFA) 108 (90 Base) MCG/ACT inhaler Inhale 2 puffs into the lungs every 6 (six) hours as needed for wheezing or shortness of breath.     Continuous Glucose Receiver (FREESTYLE LIBRE 3 READER) DEVI Inject 1 kit into the skin every 14 (fourteen) days.     fenofibrate (TRICOR) 145 MG tablet Take 145 mg by mouth daily.     HYDROcodone-acetaminophen (NORCO) 7.5-325 MG tablet Take 1 tablet by mouth every 6 (six) hours as needed for moderate pain. 120 tablet 0   insulin aspart (NOVOLOG FLEXPEN) 100 UNIT/ML FlexPen Inject 3 Units into the skin 3 (three) times daily with meals. (Patient taking differently: Inject 10-16 Units into the skin 3 (three) times daily with meals.) 15 mL 0    insulin degludec (TRESIBA FLEXTOUCH) 100 UNIT/ML FlexTouch Pen Inject 25 Units into the skin daily. (Patient not taking: Reported on 03/07/2023)     insulin glargine (LANTUS) 100 UNIT/ML injection Inject 25 Units into the skin at bedtime.     linaclotide (LINZESS) 145 MCG CAPS capsule Take 1 capsule (145 mcg total) by mouth daily before breakfast. 90 capsule 0   metFORMIN (GLUCOPHAGE) 1000 MG tablet Take 1,000 mg by mouth 2 (two) times daily with a meal.     nitroGLYCERIN (NITROSTAT) 0.4 MG SL tablet Place 1 tablet (0.4 mg total) under the tongue every 5 (five) minutes as needed for chest pain. (Patient not taking: Reported on 03/07/2023) 25 tablet 3   OLANZapine (  ZYPREXA) 10 MG tablet TAKE 1 TABLET BY MOUTH EVERYDAY AT BEDTIME 90 tablet 2   omeprazole (PRILOSEC) 20 MG capsule Take 1 capsule (20 mg total) by mouth daily. 30 capsule 2   ondansetron (ZOFRAN) 8 MG tablet TAKE 1 TABLET (8 MG TOTAL) BY MOUTH EVERY 8 (EIGHT) HOURS AS NEEDED FOR NAUSEA OR VOMITING. TAKE 1 TABLETS (8MG  TOTAL) BY MOUTH EVERY 8HRS AS NEEDED. START ON THE THIRD DAY AFTER CHEMOTHERAPY. 30 tablet 1   pravastatin (PRAVACHOL) 40 MG tablet Take 1 tablet (40 mg total) by mouth daily. 90 tablet 3   QUEtiapine (SEROQUEL) 400 MG tablet Take 800 mg by mouth at bedtime.     Current Facility-Administered Medications  Medication Dose Route Frequency Provider Last Rate Last Admin   0.9 %  sodium chloride infusion  500 mL Intravenous Continuous Imogene Burn, MD       Facility-Administered Medications Ordered in Other Visits  Medication Dose Route Frequency Provider Last Rate Last Admin   sodium chloride flush (NS) 0.9 % injection 10 mL  10 mL Intravenous PRN [name], MD   10 mL at 02/02/22 0848   sodium chloride flush (NS) 0.9 % injection 10 mL  10 mL Intravenous PRN [name], MD   10 mL at 02/15/22 1219   sodium chloride flush (NS) 0.9 % injection 10 mL  10 mL Intravenous PRN [name], MD   10 mL at 04/13/22 1241    sodium chloride flush (NS) 0.9 % injection 10 mL  10 mL Intravenous PRN [name], MD   10 mL at 05/25/22 1214    Allergies as of 03/22/2023 - Review Complete 03/07/2023  Allergen Reaction Noted   Lithium Other (See Comments) 11/26/2019   Dulaglutide Nausea And Vomiting and Other (See Comments) 09/04/2020   Penicillins Other (See Comments) 11/26/2019    Family History  Problem Relation Age of Onset   Heart attack Mother 2   Diabetes Mother    Breast cancer Mother    Breast cancer Cousin    Colon cancer Neg Hx    Esophageal cancer Neg Hx    Pancreatic cancer Neg Hx    Stomach cancer Neg Hx     Social History   Socioeconomic History   Marital status: Married    Spouse name: Not on file   Number of children: Not on file   Years of education: Not on file   Highest education level: Not on file  Occupational History   Occupation: disabled due to neuropathy and cancer treatments  Tobacco Use   Smoking status: Every Day    Packs/day: 1.00    Years: 36.00    Additional pack years: 0.00    Total pack years: 36.00    Types: Cigarettes    Start date: 11/26/2019   Smokeless tobacco: Never  Vaping Use   Vaping Use: Never used  Substance and Sexual Activity   Alcohol use: Never   Drug use: Yes    Types: Marijuana    Comment: occasionally   Sexual activity: Not on file  Other Topics Concern   Not on file  Social History Narrative   Not on file   Social Determinants of Health   Financial Resource Strain: Medium Risk (09/08/2022)   Overall Financial Resource Strain (CARDIA)    Difficulty of Paying Living Expenses: Somewhat hard  Food Insecurity: Food Insecurity Present (09/08/2022)   Hunger Vital Sign    Worried About Programme researcher, broadcasting/film/video in  the Last Year: Sometimes true    Ran Out of Food in the Last Year: Sometimes true  Transportation Needs: No Transportation Needs (09/08/2022)   PRAPARE - Administrator, Civil Service (Medical): No    Lack of  Transportation (Non-Medical): No  Physical Activity: Not on file  Stress: Not on file  Social Connections: Not on file  Intimate Partner Violence: Not At Risk (09/08/2022)   Humiliation, Afraid, Rape, and Kick questionnaire    Fear of Current or Ex-Partner: No    Emotionally Abused: No    Physically Abused: No    Sexually Abused: No    Physical Exam: Vital signs in last 24 hours: BP (!) 157/86   Pulse (!) 111   Temp 98.1 F (36.7 C)   Ht 5\' 2"  (1.575 m)   Wt 193 lb (87.5 kg)   LMP 10/04/2016 Comment: after "procedure to stop bleeding"  SpO2 97%   BMI 35.30 kg/m  GEN: NAD EYE: Sclerae anicteric ENT: MMM CV: Non-tachycardic Pulm: No increased WOB GI: Soft NEURO:  Alert & Oriented   Eulah Pont, MD [facility]   03/22/2023 9:57 AM

## 2023-03-22 NOTE — Progress Notes (Signed)
Sedate, gd SR's, VSS, report to RN 

## 2023-03-22 NOTE — Op Note (Addendum)
[facility] Patient Name: Ariana Herrera Procedure Date: 03/22/2023 10:31 AM MRN: [id number] Endoscopist: Madelyn Brunner [place] , , [phone] Age: 57 Referring MD:  Date of Birth: 11-Sep-1966 Gender: Female Account #: [id number] Procedure:                Upper GI endoscopy Indications:              Heartburn, Nausea with vomiting Medicines:                Monitored Anesthesia Care Procedure:                Pre-Anesthesia Assessment:                           - Prior to the procedure, a History and Physical                            was performed, and patient medications and                            allergies were reviewed. The patient's tolerance of                            previous anesthesia was also reviewed. The risks                            and benefits of the procedure and the sedation                            options and risks were discussed with the patient.                            All questions were answered, and informed consent                            was obtained. Prior Anticoagulants: The patient has                            taken no anticoagulant or antiplatelet agents. ASA                            Grade Assessment: III - A patient with severe                            systemic disease. After reviewing the risks and                            benefits, the patient was deemed in satisfactory                            condition to undergo the procedure.                           After obtaining informed consent, the endoscope was  passed under direct vision. Throughout the                            procedure, the patient's blood pressure, pulse, and                            oxygen saturations were monitored continuously. The                            GIF [id number] #[id number] was introduced through the                            mouth, and advanced to the second part of duodenum.                            The upper GI  endoscopy was accomplished without                            difficulty. The patient tolerated the procedure                            well. Scope In: Scope Out: Findings:                 The examined esophagus was normal.                           A hiatal hernia was present.                           A small amount of bilious fluid was found in the                            gastric body.                           Patchy mildly erythematous mucosa without bleeding                            was found in the gastric antrum. Biopsies were                            taken with a cold forceps for histology.                           Lymphangiectasia was present in the duodenal bulb                            and in the second portion of the duodenum. Biopsies                            were taken with a cold forceps for histology. Complications:            No immediate complications. Estimated Blood Loss:     Estimated blood loss was minimal. Impression:               -  Normal esophagus.                           - Hiatal hernia.                           - Small amount of bilious gastric fluid.                           - Erythematous mucosa in the antrum. Biopsied.                           - Duodenal mucosal lymphangiectasia. Biopsied. Recommendation:           - Discharge patient to home (with escort).                           - Await pathology results.                           - Use Prilosec (omeprazole) 40 mg PO BID for 8                            weeks.                           - Return to GI clinic in 6-8 weeks.                           - The findings and recommendations were discussed                            with the patient. Dr Particia Lather "[place]" [place],  03/22/2023 10:49:09 AM

## 2023-03-22 NOTE — Progress Notes (Signed)
Called to room to assist during endoscopic procedure.  Patient ID and intended procedure confirmed with present staff. Received instructions for my participation in the procedure from the performing physician.  

## 2023-03-23 ENCOUNTER — Encounter: Payer: Self-pay | Admitting: *Deleted

## 2023-03-23 ENCOUNTER — Telehealth: Payer: Self-pay | Admitting: *Deleted

## 2023-03-23 NOTE — Telephone Encounter (Signed)
  Follow up Call-     03/22/2023   10:02 AM  Call back number  Post procedure Call Back phone  # [phone]  Permission to leave phone message Yes     Patient questions:   Message left message to call us if necessary.

## 2023-03-24 ENCOUNTER — Encounter: Payer: Self-pay | Admitting: Internal Medicine

## 2023-03-31 ENCOUNTER — Other Ambulatory Visit (HOSPITAL_BASED_OUTPATIENT_CLINIC_OR_DEPARTMENT_OTHER): Payer: Self-pay

## 2023-03-31 ENCOUNTER — Encounter: Payer: Self-pay | Admitting: Medical Oncology

## 2023-03-31 ENCOUNTER — Inpatient Hospital Stay: Payer: Medicaid Other | Attending: Hematology & Oncology

## 2023-03-31 ENCOUNTER — Other Ambulatory Visit: Payer: Self-pay | Admitting: Hematology & Oncology

## 2023-03-31 ENCOUNTER — Other Ambulatory Visit: Payer: Self-pay

## 2023-03-31 ENCOUNTER — Inpatient Hospital Stay (HOSPITAL_BASED_OUTPATIENT_CLINIC_OR_DEPARTMENT_OTHER): Payer: Medicaid Other | Admitting: Medical Oncology

## 2023-03-31 ENCOUNTER — Encounter: Payer: Self-pay | Admitting: *Deleted

## 2023-03-31 ENCOUNTER — Telehealth: Payer: Self-pay

## 2023-03-31 VITALS — BP 155/76 | HR 102 | Temp 98.7°F | Resp 20 | Wt 187.0 lb

## 2023-03-31 DIAGNOSIS — Z170 Estrogen receptor positive status [ER+]: Secondary | ICD-10-CM

## 2023-03-31 DIAGNOSIS — Z853 Personal history of malignant neoplasm of breast: Secondary | ICD-10-CM | POA: Insufficient documentation

## 2023-03-31 DIAGNOSIS — C50011 Malignant neoplasm of nipple and areola, right female breast: Secondary | ICD-10-CM

## 2023-03-31 DIAGNOSIS — C50019 Malignant neoplasm of nipple and areola, unspecified female breast: Secondary | ICD-10-CM | POA: Diagnosis not present

## 2023-03-31 DIAGNOSIS — N61 Mastitis without abscess: Secondary | ICD-10-CM

## 2023-03-31 DIAGNOSIS — M25512 Pain in left shoulder: Secondary | ICD-10-CM

## 2023-03-31 LAB — CBC WITH DIFFERENTIAL (CANCER CENTER ONLY)
Abs Immature Granulocytes: 0.04 10*3/uL (ref 0.00–0.07)
Basophils Absolute: 0 10*3/uL (ref 0.0–0.1)
Basophils Relative: 0 %
Eosinophils Absolute: 0 10*3/uL (ref 0.0–0.5)
Eosinophils Relative: 1 %
HCT: 39.7 % (ref 36.0–46.0)
Hemoglobin: 13.5 g/dL (ref 12.0–15.0)
Immature Granulocytes: 1 %
Lymphocytes Relative: 24 %
Lymphs Abs: 2.1 10*3/uL (ref 0.7–4.0)
MCH: 30.7 pg (ref 26.0–34.0)
MCHC: 34 g/dL (ref 30.0–36.0)
MCV: 90.2 fL (ref 80.0–100.0)
Monocytes Absolute: 0.6 10*3/uL (ref 0.1–1.0)
Monocytes Relative: 7 %
Neutro Abs: 5.7 10*3/uL (ref 1.7–7.7)
Neutrophils Relative %: 67 %
Platelet Count: 284 10*3/uL (ref 150–400)
RBC: 4.4 MIL/uL (ref 3.87–5.11)
RDW: 14.1 % (ref 11.5–15.5)
WBC Count: 8.4 10*3/uL (ref 4.0–10.5)
nRBC: 0 % (ref 0.0–0.2)

## 2023-03-31 MED ORDER — SULFAMETHOXAZOLE-TRIMETHOPRIM 800-160 MG PO TABS: 1.0000 | ORAL_TABLET | Freq: Two times a day (BID) | ORAL | 0 refills | Status: AC

## 2023-03-31 MED ORDER — HYDROCODONE-ACETAMINOPHEN 7.5-325 MG PO TABS
1.00 | ORAL_TABLET | Freq: Four times a day (QID) | ORAL | 0 refills | Status: DC | PRN
Start: 2023-03-31 — End: 2023-04-29
  Filled 2023-03-31: qty 120, 30d supply, fill #0

## 2023-03-31 NOTE — Telephone Encounter (Signed)
Received call from patient stating that her left breast was red and warm to touch. Pt denies any fevers or discharge from nipple. Pt states she was seen by the lymphedema clinic yesterday and advised her to have it evaluated. Dr. Myna Hidalgo and Clent Jacks PA aware and pt to come in and bee seen this afternoon. Pt aware of appt time and verbalized understanding. Pt appreciative of help.

## 2023-03-31 NOTE — Progress Notes (Signed)
Symptom Management Clinic [facility] at [facility] [phone] [phone]  Patient Care Team: Maye Hides, PA as PCP - General (Physician Assistant) Chrystie Nose, MD as PCP - Cardiology (Cardiology) [name], MD as Medical Oncologist (Oncology)   Name of the patient: Ariana Herrera  [id number]  [birth year]   Oncological History: Metastatic Triple negative recurrent Left breast cancer with lymph node involvement currently with no active disease.   Current Treatment: Observation- patient requested  Previous Treatment:  Carbo/Gemzar/Pembrolizumab -- s/p cycle 6-- start on 11/27/2021 --DC on 06/10/2022 due to none tolerance [name] -- s/p cycle #4 - start on 06/23/2022 -- omitting day #8 -- started on 09/08/2022 --DC on 10/28/2022 --patient request  Date of visit: 03/31/23  Reason for Consult: Ariana Herrera is a 57 y.o. female who presents today for worsening left breast pain and swelling:  This concern has been going on for about 1 month. Worsening over the past 2-3 weeks. Previously she had a PET scan completed to further assess her concern. The PET scan on 03/04/2023 showed NAD. She was also referred to a breast surgeon to discuss possible bilateral mastectomy given her chronic pain of breasts and for further evaluation of potential inflammatory breast cancer. At this visit they referred her to lymphedema clinic who then referred her back to [place] for concerns of infection.   Wt Readings from Last 3 Encounters:  03/31/23 187 lb (84.8 kg)  03/22/23 193 lb (87.5 kg)  03/07/23 193 lb (87.5 kg)     PAST MEDICAL HISTORY: Past Medical History:  Diagnosis Date   Anginal pain (HCC)    Anxiety    Arthritis    Asthma    Bipolar 1 disorder (HCC)    Bipolar disease, chronic (HCC)    Breast cancer in female (HCC)    2019 and recurrent in 2023 to right breast, right axillary, and left neck   Coronary artery disease    Deep  vein blood clot of right lower extremity (HCC)    Diabetes mellitus type 2 in obese    Goals of care, counseling/discussion 11/23/2021   Hyperlipidemia    Lymphedema    MI (myocardial infarction) (HCC)    was in [place]   Obesity    Pancreatitis    Personal history of chemotherapy    Personal history of radiation therapy    Sleep apnea     PAST SURGICAL HISTORY:  Past Surgical History:  Procedure Laterality Date   BREAST LUMPECTOMY Left 03/2019   CATARACT EXTRACTION Bilateral    CHOLECYSTECTOMY     IR IMAGING GUIDED PORT INSERTION  12/15/2021   IR REMOVAL TUN ACCESS W/ PORT W/O FL MOD SED  12/20/2019   IR US GUIDE BX ASP/DRAIN  11/02/2021   LEFT HEART CATH AND CORONARY ANGIOGRAPHY N/A 08/22/2020   Procedure: LEFT HEART CATH AND CORONARY ANGIOGRAPHY;  Surgeon: Kathleene Hazel, MD;  Location: [facility] INVASIVE CV LAB;  Service: Cardiovascular;  Laterality: N/A;   TRIGGER FINGER RELEASE Bilateral    x 5   TUBAL LIGATION     UMBILICAL HERNIA REPAIR     x 2   uterine ablation      HEMATOLOGY/ONCOLOGY HISTORY:  Oncology History  Breast cancer (HCC)  11/26/2019 Initial Diagnosis   Breast cancer (HCC)   11/26/2019 Cancer Staging   Staging form: Breast, AJCC 8th Edition - Pathologic: Stage IIA (pT2, pN0, cM0, G3, ER-, PR-, HER2-) - Signed by Arthur Holms, MD on  11/26/2019   11/23/2021 Cancer Staging   Staging form: Breast, AJCC 8th Edition - Pathologic stage from 11/23/2021: Stage IV (pT2, pN3c, cM1, G2, ER-, PR-, HER2-) - Signed by [name], MD on 11/23/2021 Nuclear grade: G2 Histologic grading system: 3 grade system   11/30/2021 - 06/01/2022 Chemotherapy   Patient is on Treatment Plan : BREAST Pembrolizumab (200) D1 + Carboplatin (2) D1,8 + Gemcitabine (1000) D1,8 q21d     07/01/2022 - 10/06/2022 Chemotherapy   Patient is on Treatment Plan : BREAST METASTATIC Sacituzumab govitecan-hziy [name]) D1,8 q21d       ALLERGIES:  is allergic to lithium, dulaglutide, and  penicillins.  MEDICATIONS:  Current Outpatient Medications  Medication Sig Dispense Refill   sulfamethoxazole-trimethoprim (BACTRIM DS) 800-160 MG tablet Take 1 tablet by mouth 2 (two) times daily for 7 days. 14 tablet 0   ACCU-CHEK GUIDE test strip 3 (three) times daily.     albuterol (ACCUNEB) 1.25 MG/3ML nebulizer solution Take 1 ampule by nebulization 4 (four) times daily as needed for wheezing or shortness of breath.     albuterol (VENTOLIN HFA) 108 (90 Base) MCG/ACT inhaler Inhale 2 puffs into the lungs every 6 (six) hours as needed for wheezing or shortness of breath.     Continuous Glucose Receiver (FREESTYLE LIBRE 3 READER) DEVI Inject 1 kit into the skin every 14 (fourteen) days.     fenofibrate (TRICOR) 145 MG tablet Take 145 mg by mouth daily.     HYDROcodone-acetaminophen (NORCO) 7.5-325 MG tablet Take 1 tablet by mouth every 6 (six) hours as needed for moderate pain. 120 tablet 0   insulin aspart (NOVOLOG FLEXPEN) 100 UNIT/ML FlexPen Inject 3 Units into the skin 3 (three) times daily with meals. (Patient taking differently: Inject 10-16 Units into the skin 3 (three) times daily with meals.) 15 mL 0   insulin degludec (TRESIBA FLEXTOUCH) 100 UNIT/ML FlexTouch Pen Inject 25 Units into the skin daily. (Patient not taking: Reported on 03/22/2023)     insulin glargine (LANTUS) 100 UNIT/ML injection Inject 25 Units into the skin at bedtime.     linaclotide (LINZESS) 145 MCG CAPS capsule Take 1 capsule (145 mcg total) by mouth daily before breakfast. 90 capsule 0   metFORMIN (GLUCOPHAGE) 1000 MG tablet Take 1,000 mg by mouth 2 (two) times daily with a meal.     nitroGLYCERIN (NITROSTAT) 0.4 MG SL tablet Place 1 tablet (0.4 mg total) under the tongue every 5 (five) minutes as needed for chest pain. (Patient not taking: Reported on 03/07/2023) 25 tablet 3   OLANZapine (ZYPREXA) 10 MG tablet TAKE 1 TABLET BY MOUTH EVERYDAY AT BEDTIME 90 tablet 2   omeprazole (PRILOSEC) 20 MG capsule Take 1  capsule (20 mg total) by mouth daily. 30 capsule 2   omeprazole (PRILOSEC) 40 MG capsule Take 1 capsule (40 mg total) by mouth 2 (two) times daily. For 8 weeks 90 capsule 3   ondansetron (ZOFRAN) 8 MG tablet TAKE 1 TABLET (8 MG TOTAL) BY MOUTH EVERY 8 (EIGHT) HOURS AS NEEDED FOR NAUSEA OR VOMITING. TAKE 1 TABLETS (8MG  TOTAL) BY MOUTH EVERY 8HRS AS NEEDED. START ON THE THIRD DAY AFTER CHEMOTHERAPY. 30 tablet 1   pravastatin (PRAVACHOL) 40 MG tablet Take 1 tablet (40 mg total) by mouth daily. 90 tablet 3   QUEtiapine (SEROQUEL) 400 MG tablet Take 800 mg by mouth at bedtime.     No current facility-administered medications for this visit.   Facility-Administered Medications Ordered in Other Visits  Medication Dose  Route Frequency Provider Last Rate Last Admin   sodium chloride flush (NS) 0.9 % injection 10 mL  10 mL Intravenous PRN [name], MD   10 mL at 02/02/22 0848   sodium chloride flush (NS) 0.9 % injection 10 mL  10 mL Intravenous PRN [name], MD   10 mL at 02/15/22 1219   sodium chloride flush (NS) 0.9 % injection 10 mL  10 mL Intravenous PRN [name], MD   10 mL at 04/13/22 1241   sodium chloride flush (NS) 0.9 % injection 10 mL  10 mL Intravenous PRN [name], MD   10 mL at 05/25/22 1214    VITAL SIGNS: BP (!) 155/76 (BP Location: Right Arm, Patient Position: Sitting)   Pulse (!) 102 Comment: states "was rushing to get here"  Temp 98.7 F (37.1 C) (Oral)   Resp 20   Wt 187 lb (84.8 kg)   LMP 10/04/2016 Comment: after "procedure to stop bleeding"  SpO2 97%   BMI 34.20 kg/m  Filed Weights   03/31/23 1216  Weight: 187 lb (84.8 kg)    Estimated body mass index is 34.2 kg/m as calculated from the following:   Height as of 03/22/23: 5\' 2"  (1.575 m).   Weight as of this encounter: 187 lb (84.8 kg).  LABS: CBC:    Component Value Date/Time   WBC 8.4 03/31/2023 1158   WBC 5.1 08/23/2020 0558   HGB 13.5 03/31/2023 1158   HCT 39.7 03/31/2023 1158    PLT 284 03/31/2023 1158   MCV 90.2 03/31/2023 1158   NEUTROABS 5.7 03/31/2023 1158   LYMPHSABS 2.1 03/31/2023 1158   MONOABS 0.6 03/31/2023 1158   EOSABS 0.0 03/31/2023 1158   BASOSABS 0.0 03/31/2023 1158   Comprehensive Metabolic Panel:    Component Value Date/Time   NA 136 03/07/2023 1018   K 4.1 03/07/2023 1018   CL 104 03/07/2023 1018   CO2 25 03/07/2023 1018   BUN 13 03/07/2023 1018   CREATININE 0.99 03/07/2023 1018   GLUCOSE 217 (H) 03/07/2023 1018   CALCIUM 9.5 03/07/2023 1018   AST 14 (L) 03/07/2023 1018   ALT 14 03/07/2023 1018   ALKPHOS 51 03/07/2023 1018   BILITOT 0.3 03/07/2023 1018   PROT 6.7 03/07/2023 1018   PROT 6.9 07/13/2021 0831   ALBUMIN 4.2 03/07/2023 1018   ALBUMIN 4.5 07/13/2021 0831    RADIOGRAPHIC STUDIES: NM PET Image Restag (PS) Skull Base To Thigh  Result Date: 03/08/2023 CLINICAL DATA:  Subsequent treatment strategy for breast cancer. EXAM: NUCLEAR MEDICINE PET SKULL BASE TO THIGH TECHNIQUE: 9.6 mCi F-18 FDG was injected intravenously. Full-ring PET imaging was performed from the skull base to thigh after the radiotracer. CT data was obtained and used for attenuation correction and anatomic localization. Fasting blood glucose: 146 mg/dl COMPARISON:  PET-CT dated to 03/04/2023 FINDINGS: Mediastinal blood pool activity: SUV max 3.0 Liver activity: SUV max NA NECK: No hypermetabolic cervical lymphadenopathy. Incidental CT findings: None. CHEST: Status post left breast lumpectomy with overlying skin thickening/radiation changes. No focal hypermetabolism to suggest local recurrence. Radiation changes in the anterior left upper lobe. Stable 5 mm nodule along the right minor fissure (series 7/image 31), likely reflecting a benign subpleural lymph node. No hypermetabolic thoracic lymphadenopathy. Right chest port terminates at the cavoatrial junction. Incidental CT findings: Atherosclerotic calcifications of the aortic arch. Moderate three-vessel coronary  atherosclerosis. ABDOMEN/PELVIS: No abnormal hypermetabolism in the liver, spleen, pancreas, or adrenal glands. No abnormality abdominopelvic  lymphadenopathy. Incidental CT findings: Status post cholecystectomy. Atherosclerotic calcifications the abdominal aorta and branch vessels. SKELETON: No focal hypermetabolic activity to suggest skeletal metastasis. Incidental CT findings: Mild degenerative changes of the visualized thoracolumbar spine. IMPRESSION: Status post left breast lumpectomy with radiation changes. No evidence of recurrent or metastatic disease. Electronically Signed   By: Charline Bills M.D.   On: 03/08/2023 01:08    PERFORMANCE STATUS (ECOG) : 1 - Symptomatic but completely ambulatory  Review of Systems Unless otherwise noted, a complete review of systems is negative.  Physical Exam General: NAD Cardiovascular: regular rate and rhythm Pulmonary: clear ant fields Breast: The left breast is tender to touch with moderate erythema throughout. No discharge or adenopathy.  Skin: no rashes Neurological: Weakness but otherwise nonfocal  Assessment and Plan- Patient is a 57 y.o. female    Encounter Diagnoses  Name Primary?   Malignant neoplasm of areola of breast in female, estrogen receptor positive, unspecified laterality (HCC) Yes   Mastitis     This is a complicated case. Technically patient would be considered with having stage IV disease given former distant lymph node involvement however she currently is off treatment and is had NAD on recent PET imaging. CA 27.29 has been relatively stable. Discussed concerns and course with Dr. Rexene Edison. At this time we are going to treat with Bactrim DS and discuss with breast surgery further. Reviewed red flag signs and symptoms.    Patient expressed understanding and was in agreement with this plan. She also understands that She can call clinic at any time with any questions, concerns, or complaints.   Thank you for allowing me to  participate in the care of this very pleasant patient.   Time Total: 25  Visit consisted of counseling and education dealing with the complex and emotionally intense issues of symptom management in the setting of serious illness.Greater than 50%  of this time was spent counseling and coordinating care related to the above assessment and plan.  Signed by: Clent Jacks, PA-C

## 2023-04-01 ENCOUNTER — Other Ambulatory Visit (HOSPITAL_BASED_OUTPATIENT_CLINIC_OR_DEPARTMENT_OTHER): Payer: Self-pay

## 2023-04-01 ENCOUNTER — Other Ambulatory Visit: Payer: Self-pay

## 2023-04-01 ENCOUNTER — Other Ambulatory Visit: Payer: Self-pay | Admitting: Surgery

## 2023-04-01 DIAGNOSIS — C50912 Malignant neoplasm of unspecified site of left female breast: Secondary | ICD-10-CM

## 2023-04-05 ENCOUNTER — Other Ambulatory Visit: Payer: Self-pay

## 2023-04-05 ENCOUNTER — Encounter: Payer: Self-pay | Admitting: Family Medicine

## 2023-04-05 ENCOUNTER — Other Ambulatory Visit (HOSPITAL_BASED_OUTPATIENT_CLINIC_OR_DEPARTMENT_OTHER): Payer: Self-pay

## 2023-04-05 ENCOUNTER — Ambulatory Visit (INDEPENDENT_AMBULATORY_CARE_PROVIDER_SITE_OTHER): Payer: Medicaid Other | Admitting: Family Medicine

## 2023-04-05 VITALS — BP 130/60 | Ht 62.0 in | Wt 187.0 lb

## 2023-04-05 DIAGNOSIS — M25562 Pain in left knee: Secondary | ICD-10-CM

## 2023-04-05 MED ORDER — DICLOFENAC SODIUM 75 MG PO TBEC
75.0000 mg | DELAYED_RELEASE_TABLET | Freq: Two times a day (BID) | ORAL | 1 refills | Status: DC
  Filled 2023-04-05: qty 60, 30d supply, fill #0

## 2023-04-05 NOTE — Patient Instructions (Signed)
You tore your lateral meniscus of your knee. Ice 15 minutes at a time up to every hour. Compression sleeve or ace wrap to help keep the swelling down. Elevate above your heart when possible. Diclofenac 75mg  twice a day with food for pain and inflammation - don't take aleve or ibuprofen while on this. If you're struggling we could do a cortisone injection into this knee but I would recommend waiting until you're off antibiotics (in 1 week). If you're doing well follow up with me in 4 weeks.

## 2023-04-05 NOTE — Progress Notes (Signed)
PCP: Maye Hides, PA  Subjective:   HPI: Patient is a 57 y.o. female here for left knee pain.  Patient reports about 2 weeks ago she developed severe, sharp lateral left knee pain. No acute injury or trauma but notable swelling. Has been icing. No catching, locking, giving out. Difficulty walking due to pain.  Past Medical History:  Diagnosis Date   Anginal pain (HCC)    Anxiety    Arthritis    Asthma    Bipolar 1 disorder (HCC)    Bipolar disease, chronic (HCC)    Breast cancer in female (HCC)    2019 and recurrent in 2023 to right breast, right axillary, and left neck   Coronary artery disease    Deep vein blood clot of right lower extremity (HCC)    Diabetes mellitus type 2 in obese    Goals of care, counseling/discussion 11/23/2021   Hyperlipidemia    Lymphedema    MI (myocardial infarction) (HCC)    was in [place]   Obesity    Pancreatitis    Personal history of chemotherapy    Personal history of radiation therapy    Sleep apnea     Current Outpatient Medications on File Prior to Visit  Medication Sig Dispense Refill   ACCU-CHEK GUIDE test strip 3 (three) times daily.     albuterol (ACCUNEB) 1.25 MG/3ML nebulizer solution Take 1 ampule by nebulization 4 (four) times daily as needed for wheezing or shortness of breath.     albuterol (VENTOLIN HFA) 108 (90 Base) MCG/ACT inhaler Inhale 2 puffs into the lungs every 6 (six) hours as needed for wheezing or shortness of breath.     Continuous Glucose Receiver (FREESTYLE LIBRE 3 READER) DEVI Inject 1 kit into the skin every 14 (fourteen) days.     fenofibrate (TRICOR) 145 MG tablet Take 145 mg by mouth daily.     HYDROcodone-acetaminophen (NORCO) 7.5-325 MG tablet Take 1 tablet by mouth every 6 (six) hours as needed for moderate pain. 120 tablet 0   insulin aspart (NOVOLOG FLEXPEN) 100 UNIT/ML FlexPen Inject 3 Units into the skin 3 (three) times daily with meals. (Patient taking differently: Inject 10-16 Units  into the skin 3 (three) times daily with meals.) 15 mL 0   insulin degludec (TRESIBA FLEXTOUCH) 100 UNIT/ML FlexTouch Pen Inject 25 Units into the skin daily. (Patient not taking: Reported on 03/22/2023)     insulin glargine (LANTUS) 100 UNIT/ML injection Inject 25 Units into the skin at bedtime.     linaclotide (LINZESS) 145 MCG CAPS capsule Take 1 capsule (145 mcg total) by mouth daily before breakfast. 90 capsule 0   metFORMIN (GLUCOPHAGE) 1000 MG tablet Take 1,000 mg by mouth 2 (two) times daily with a meal.     nitroGLYCERIN (NITROSTAT) 0.4 MG SL tablet Place 1 tablet (0.4 mg total) under the tongue every 5 (five) minutes as needed for chest pain. (Patient not taking: Reported on 03/07/2023) 25 tablet 3   OLANZapine (ZYPREXA) 10 MG tablet TAKE 1 TABLET BY MOUTH EVERYDAY AT BEDTIME 90 tablet 2   omeprazole (PRILOSEC) 20 MG capsule Take 1 capsule (20 mg total) by mouth daily. 30 capsule 2   omeprazole (PRILOSEC) 40 MG capsule Take 1 capsule (40 mg total) by mouth 2 (two) times daily. For 8 weeks 90 capsule 3   ondansetron (ZOFRAN) 8 MG tablet TAKE 1 TABLET (8 MG TOTAL) BY MOUTH EVERY 8 (EIGHT) HOURS AS NEEDED FOR NAUSEA OR VOMITING. TAKE 1 TABLETS (8MG  TOTAL)  BY MOUTH EVERY 8HRS AS NEEDED. START ON THE THIRD DAY AFTER CHEMOTHERAPY. 30 tablet 1   pravastatin (PRAVACHOL) 40 MG tablet Take 1 tablet (40 mg total) by mouth daily. 90 tablet 3   QUEtiapine (SEROQUEL) 400 MG tablet Take 800 mg by mouth at bedtime.     sulfamethoxazole-trimethoprim (BACTRIM DS) 800-160 MG tablet Take 1 tablet by mouth 2 (two) times daily for 7 days. 14 tablet 0   Current Facility-Administered Medications on File Prior to Visit  Medication Dose Route Frequency Provider Last Rate Last Admin   sodium chloride flush (NS) 0.9 % injection 10 mL  10 mL Intravenous PRN [name], MD   10 mL at 02/02/22 0848   sodium chloride flush (NS) 0.9 % injection 10 mL  10 mL Intravenous PRN [name], MD   10 mL at 02/15/22 1219    sodium chloride flush (NS) 0.9 % injection 10 mL  10 mL Intravenous PRN [name], MD   10 mL at 04/13/22 1241   sodium chloride flush (NS) 0.9 % injection 10 mL  10 mL Intravenous PRN [name], MD   10 mL at 05/25/22 1214    Past Surgical History:  Procedure Laterality Date   BREAST LUMPECTOMY Left 03/2019   CATARACT EXTRACTION Bilateral    CHOLECYSTECTOMY     IR IMAGING GUIDED PORT INSERTION  12/15/2021   IR REMOVAL TUN ACCESS W/ PORT W/O FL MOD SED  12/20/2019   IR US GUIDE BX ASP/DRAIN  11/02/2021   LEFT HEART CATH AND CORONARY ANGIOGRAPHY N/A 08/22/2020   Procedure: LEFT HEART CATH AND CORONARY ANGIOGRAPHY;  Surgeon: Kathleene Hazel, MD;  Location: [facility] IN[facility];  Service: Cardiovascular;  Laterality: N/A;   TRIGGER FINGER RELEASE Bilateral    x 5   TUBAL LIGATION     UMBILICAL HERNIA REPAIR     x 2   uterine ablation      Allergies  Allergen Reactions   Lithium Other (See Comments)    Spinal fluid built up in brain   Dulaglutide Nausea And Vomiting and Other (See Comments)   Penicillins Other (See Comments)    UNKNOWN CHILDHOOD REACTION    BP 130/60 (BP Location: Right Arm, Patient Position: Sitting)   Ht 5\' 2"  (1.575 m)   Wt 187 lb (84.8 kg)   LMP 10/04/2016 Comment: after "procedure to stop bleeding"  BMI 34.20 kg/m       No data to display              No data to display              Objective:  Physical Exam:  Gen: NAD, comfortable in exam room  Left knee: Mild effusion.  No other gross deformity, ecchymoses. TTP lateral joint line.  No other tenderness. FROM with normal strength. Negative ant/post drawers. Negative valgus/varus testing. Negative lachman.  Positive mcmurrays, apleys.  NV intact distally.    Assessment & Plan:  1. Left knee pain - with effusion. Unusual that she did not have a known acute injury but effusion confirmed with ultrasound and she has positive meniscal testing.  Treat conservatively  - icing, compression, elevation.  Diclofenac twice a day.  She is on antibiotics right now - to be safe wouldn't recommend injection for a week if she needs one due to severity of pain.  F/u in 4 weeks if doing well.

## 2023-04-21 LAB — HM MAMMOGRAPHY: HM Mammogram: NORMAL (ref 0–4)

## 2023-04-29 ENCOUNTER — Other Ambulatory Visit: Payer: Self-pay | Admitting: Hematology & Oncology

## 2023-04-29 ENCOUNTER — Other Ambulatory Visit (HOSPITAL_BASED_OUTPATIENT_CLINIC_OR_DEPARTMENT_OTHER): Payer: Self-pay

## 2023-04-29 DIAGNOSIS — M25512 Pain in left shoulder: Secondary | ICD-10-CM

## 2023-04-29 DIAGNOSIS — C50011 Malignant neoplasm of nipple and areola, right female breast: Secondary | ICD-10-CM

## 2023-04-29 MED ORDER — HYDROCODONE-ACETAMINOPHEN 7.5-325 MG PO TABS
1.0000 | ORAL_TABLET | Freq: Four times a day (QID) | ORAL | 0 refills | Status: DC | PRN
Start: 2023-04-29 — End: 2023-05-30
  Filled 2023-04-29: qty 120, 30d supply, fill #0

## 2023-05-02 ENCOUNTER — Ambulatory Visit: Payer: Medicaid Other | Admitting: Family Medicine

## 2023-05-02 LAB — HEMOGLOBIN A1C: Hemoglobin A1C: 9.3

## 2023-05-05 IMAGING — MR MR SHOULDER*L* WO/W CM
5 of 8 series · 26 of 40 positions shown · IV contrast (gadavist)
Comparison: None Available.

CLINICAL DATA: Left shoulder pain and limited range of motion for 1
month. No known injury. History of breast cancer.

EXAM:
MRI OF THE LEFT SHOULDER WITHOUT AND WITH CONTRAST
TECHNIQUE: Multiplanar, multisequence MR imaging of the left shoulder was
performed before and after the administration of intravenous
contrast.
CONTRAST:  8 mL GADAVIST IV SOLN

[Series 7: T2 fat-sat · axial · left · 4.0mm · 0.31mm/px · z∈[-97,+6]mm · 6 of 27 slices shown (1 of 3)]
[im 1/27]
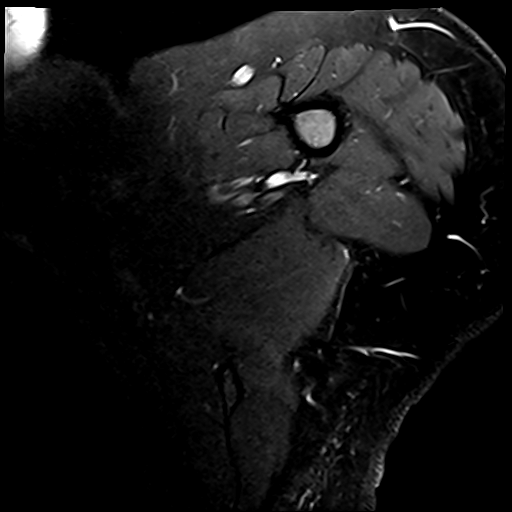
[im 6/27]
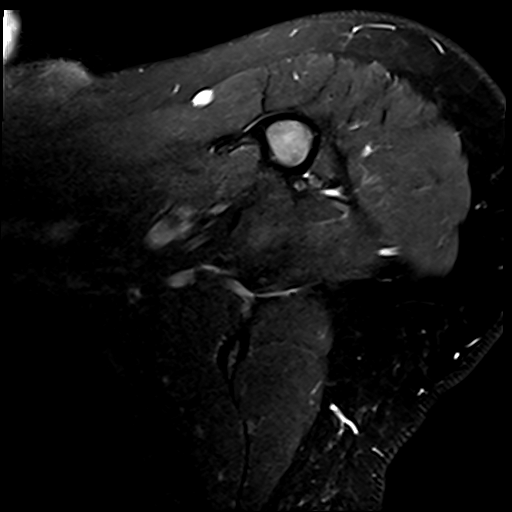
[im 11/27]
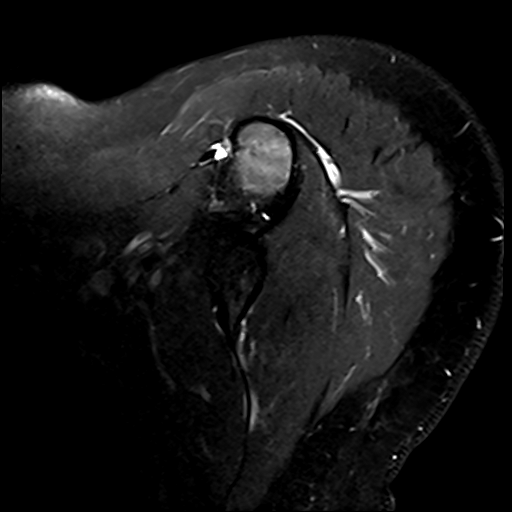
[im 16/27]
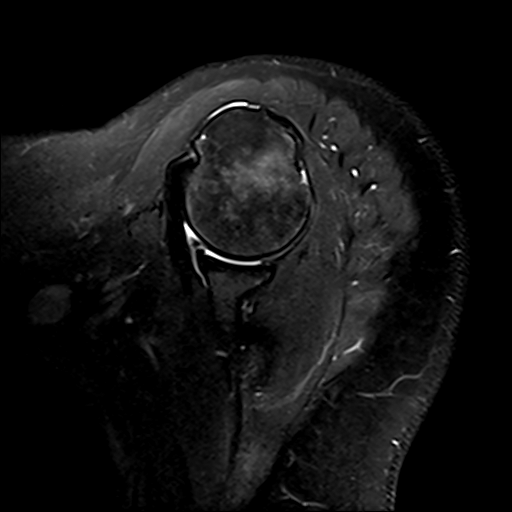
[im 21/27]
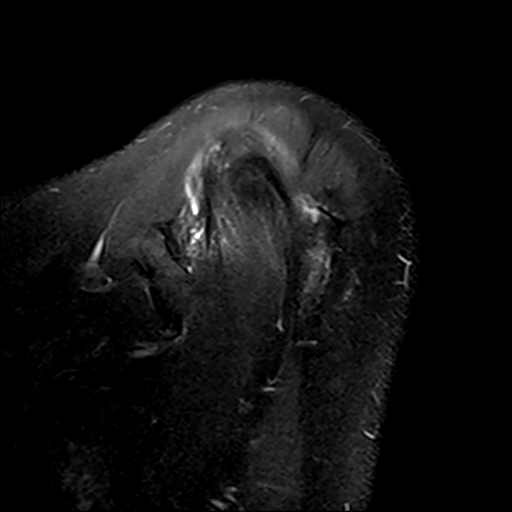
[im 27/27]
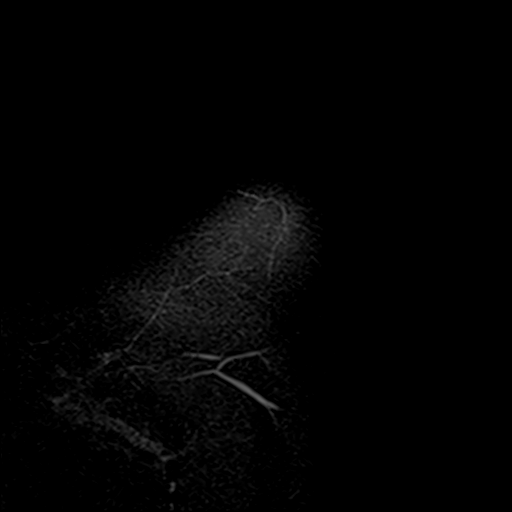

[Series 8: T2 fat-sat · oblique · left · 4.0mm · 0.50mm/px · 4 of 20 slices shown (2 of 3)]
[im 1/20]
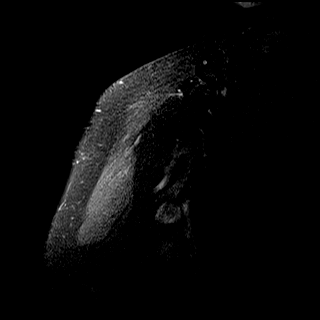
[im 7/20]
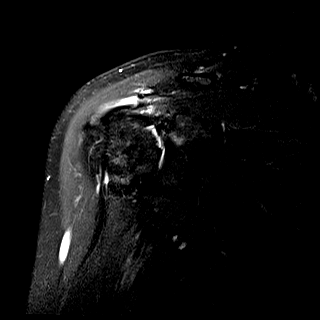
[im 13/20]
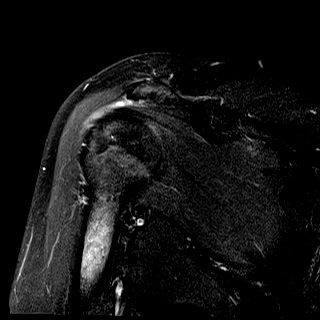
[im 20/20]
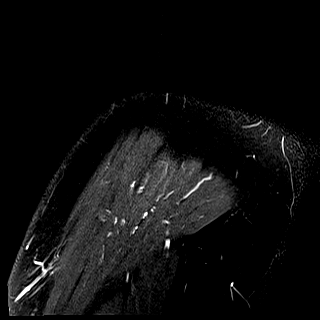

[Series 9: PD · oblique · left · 4.0mm · 0.31mm/px · 4 of 20 slices shown]
[im 1/20]
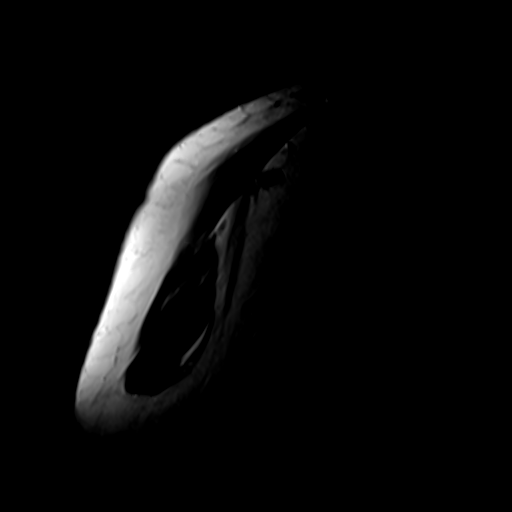
[im 7/20]
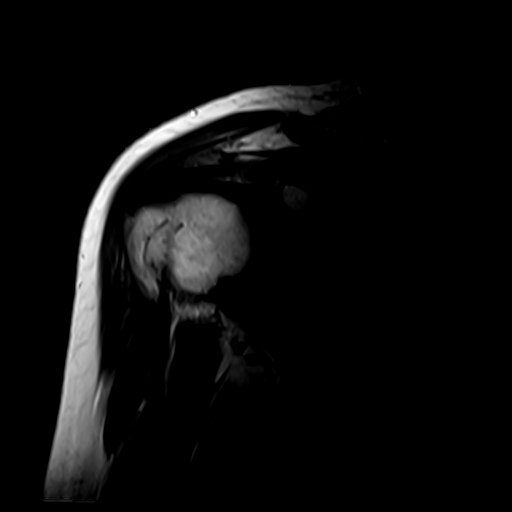
[im 13/20]
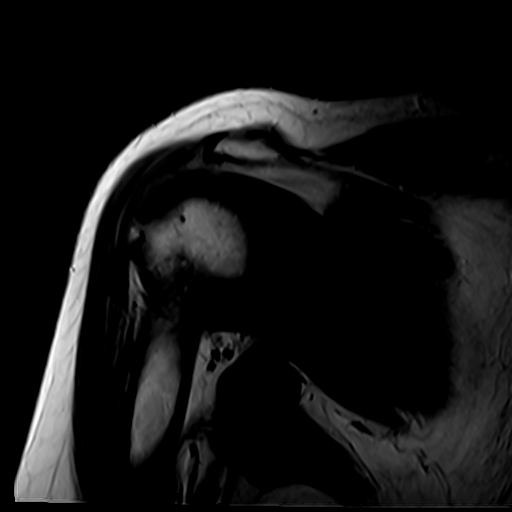
[im 20/20]
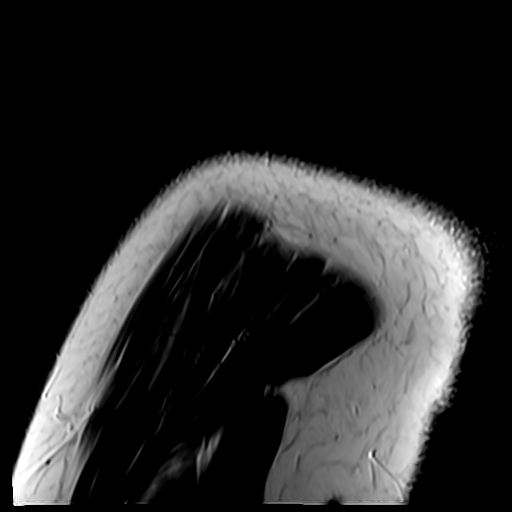

[Series 10: T1 · oblique · left · 3.0mm · 0.29mm/px · 6 of 29 slices shown]
[im 1/29]
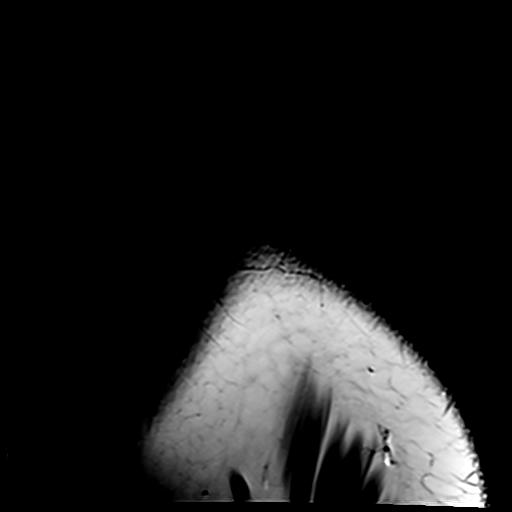
[im 6/29]
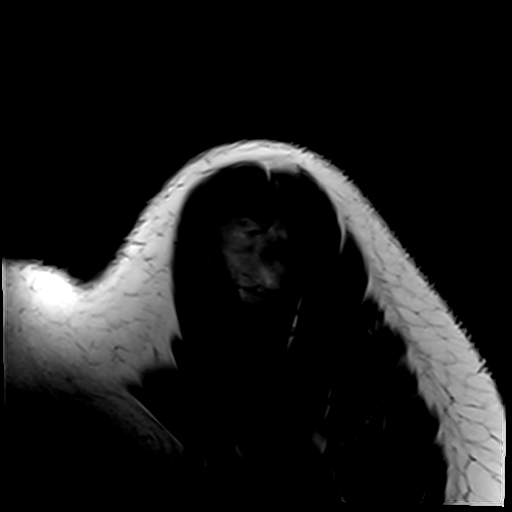
[im 12/29]
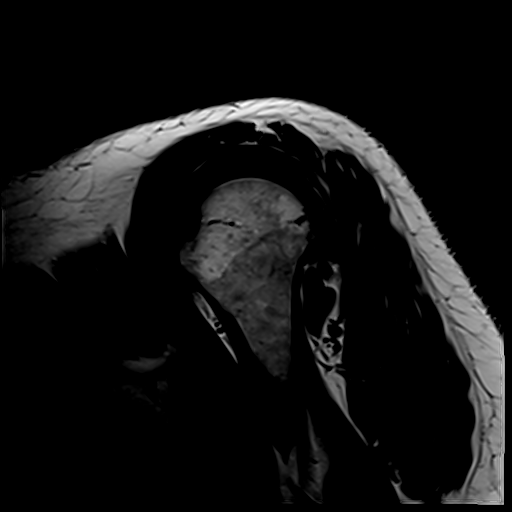
[im 17/29]
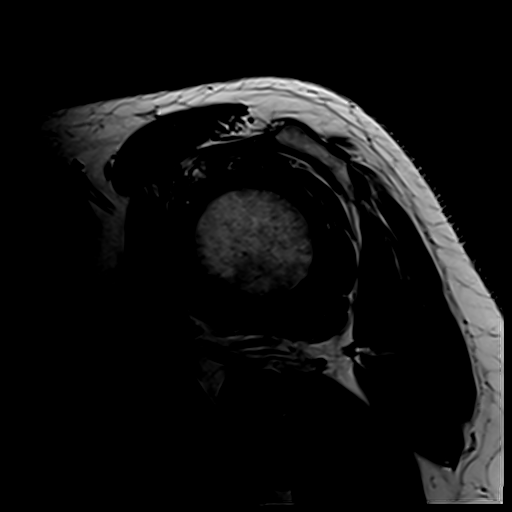
[im 23/29]
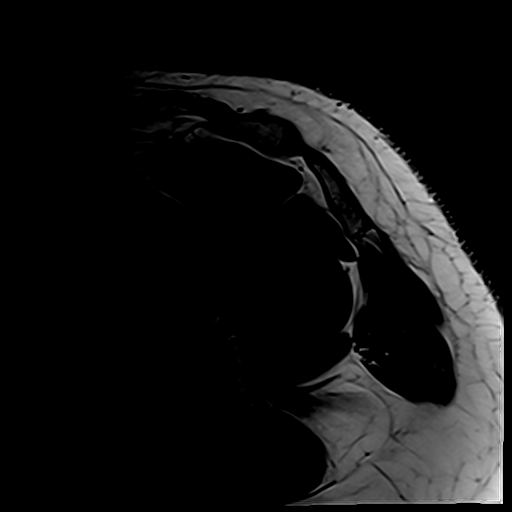
[im 29/29]
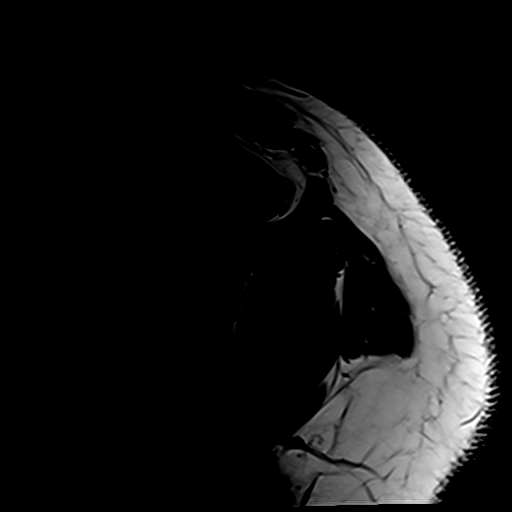

[Series 11: T2 fat-sat · oblique · left · 3.0mm · 0.59mm/px · 6 of 29 slices shown (3 of 3)]
[im 1/29]
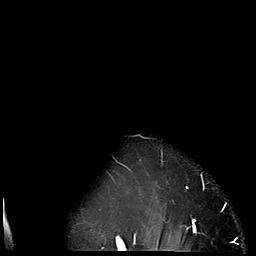
[im 6/29]
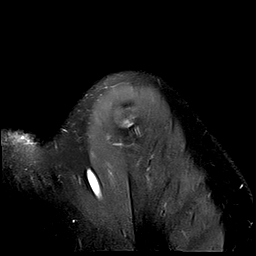
[im 12/29]
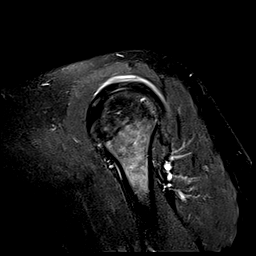
[im 17/29]
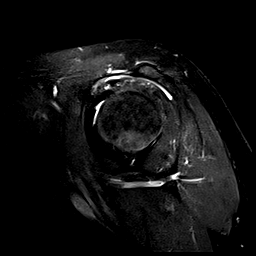
[im 23/29]
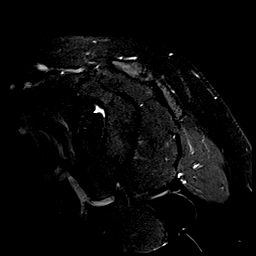
[im 29/29]
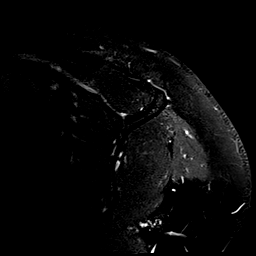

[26 of 40 positions shown; findings below may reference images not displayed]

FINDINGS: Rotator cuff: Intact. Thickening and heterogeneously increased T2
signal in the cuff tendons consistent with tendinopathy are worst in
the supraspinatus.

Muscles:  Normal without atrophy or focal lesion.

Biceps long head:  Intact.

Acromioclavicular Joint: Mild osteoarthritis. Type 2 acromion. There
is a small volume of fluid in the subacromial/subdeltoid bursa.

Glenohumeral Joint: Appears normal.

Labrum:  Intact.

Bones: Negative. No fracture, contusion, stress change or focal
lesion.

Other: None.
IMPRESSION: Rotator cuff tendinopathy without tear appears worst in the
supraspinatus.

Mild acromioclavicular osteoarthritis.

Small volume of subacromial/subdeltoid fluid compatible with
bursitis.

Negative for metastatic disease.

## 2023-05-13 ENCOUNTER — Ambulatory Visit (INDEPENDENT_AMBULATORY_CARE_PROVIDER_SITE_OTHER): Payer: Medicaid Other | Admitting: Internal Medicine

## 2023-05-13 ENCOUNTER — Encounter: Payer: Self-pay | Admitting: Internal Medicine

## 2023-05-13 VITALS — BP 122/70 | HR 94 | Ht 62.0 in | Wt 191.0 lb

## 2023-05-13 DIAGNOSIS — K449 Diaphragmatic hernia without obstruction or gangrene: Secondary | ICD-10-CM | POA: Diagnosis not present

## 2023-05-13 DIAGNOSIS — K219 Gastro-esophageal reflux disease without esophagitis: Secondary | ICD-10-CM | POA: Diagnosis not present

## 2023-05-13 DIAGNOSIS — R112 Nausea with vomiting, unspecified: Secondary | ICD-10-CM | POA: Diagnosis not present

## 2023-05-13 DIAGNOSIS — K5900 Constipation, unspecified: Secondary | ICD-10-CM

## 2023-05-13 MED ORDER — OMEPRAZOLE 40 MG PO CPDR
40.0000 mg | DELAYED_RELEASE_CAPSULE | Freq: Two times a day (BID) | ORAL | 3 refills | Status: DC
Start: 2023-05-13 — End: 2023-06-22

## 2023-05-13 MED ORDER — FAMOTIDINE 20 MG PO TABS: 20.0000 mg | ORAL_TABLET | Freq: Two times a day (BID) | ORAL | 3 refills | Status: DC

## 2023-05-13 MED ORDER — ONDANSETRON 4 MG PO TBDP: 4.0000 mg | ORAL_TABLET | Freq: Three times a day (TID) | ORAL | 3 refills | Status: DC | PRN

## 2023-05-13 MED ORDER — LINACLOTIDE 290 MCG PO CAPS: 290.0000 ug | ORAL_CAPSULE | Freq: Every day | ORAL | 3 refills | Status: DC

## 2023-05-13 NOTE — Progress Notes (Signed)
Chief Complaint: Nausea, GERD, and constipation  HPI : 57 year old female with history of infiltrating ductal carcinoma s/p chemotherapy, bipolar disorder, DM, prior DVT, CAD, OSA, obesity, OSA, prior pancreatitis presents for follow up of nausea, GERD, and constipation  Interval History: She is still having some N&V, though she is not vomiting every day. If she coughs or swallows something wrong, she will get nauseous. If she gets a lot of phlegm in her chest, then that causes her to throw up. She will even vomit up her Zofran at times. She is currently on omeprazole 40 mg BID. She does know that the omeprazole helps. If she misses a dose of PPI, then she will have really bad reflux and nausea. Her BMs are more regular. She has one BM per week, which is improved from every 3 weeks. She is taking the Linzess 145 mcg every day, which seems to be helping. Blood sugars are up-and-down, but she is still working on it with her PCP. Her blood sugars can be up to 400s at times. She is still using marijuana at times as encouraged by her oncologist. She is still smoking. She is also using Norco.  Wt Readings from Last 3 Encounters:  05/13/23 191 lb (86.6 kg)  04/05/23 187 lb (84.8 kg)  03/31/23 187 lb (84.8 kg)   Past Medical History:  Diagnosis Date   Anginal pain (HCC)    Anxiety    Arthritis    Asthma    Bipolar 1 disorder (HCC)    Bipolar disease, chronic (HCC)    Breast cancer in female (HCC)    2019 and recurrent in 2023 to right breast, right axillary, and left neck   Coronary artery disease    Deep vein blood clot of right lower extremity (HCC)    Diabetes mellitus type 2 in obese    Goals of care, counseling/discussion 11/23/2021   Hyperlipidemia    Lymphedema    MI (myocardial infarction) (HCC)    was in [place]   Obesity    Pancreatitis    Personal history of chemotherapy    Personal history of radiation therapy    Sleep apnea      Past Surgical History:  Procedure  Laterality Date   BREAST LUMPECTOMY Left 03/2019   CATARACT EXTRACTION Bilateral    CHOLECYSTECTOMY     IR IMAGING GUIDED PORT INSERTION  12/15/2021   IR REMOVAL TUN ACCESS W/ PORT W/O FL MOD SED  12/20/2019   IR US GUIDE BX ASP/DRAIN  11/02/2021   LEFT HEART CATH AND CORONARY ANGIOGRAPHY N/A 08/22/2020   Procedure: LEFT HEART CATH AND CORONARY ANGIOGRAPHY;  Surgeon: Kathleene Hazel, MD;  Location: [facility];  Service: Cardiovascular;  Laterality: N/A;   TRIGGER FINGER RELEASE Bilateral    x 5   TUBAL LIGATION     UMBILICAL HERNIA REPAIR     x 2   uterine ablation     Family History  Problem Relation Age of Onset   Heart attack Mother 82   Diabetes Mother    Breast cancer Mother    Breast cancer Cousin    Colon cancer Neg Hx    Esophageal cancer Neg Hx    Pancreatic cancer Neg Hx    Stomach cancer Neg Hx    Social History   Tobacco Use   Smoking status: Every Day    Current packs/day: 1.00    Average packs/day: 1 pack/day for 36.0 years (36.0 ttl pk-yrs)  Types: Cigarettes    Start date: 11/26/2019   Smokeless tobacco: Never  Vaping Use   Vaping status: Never Used  Substance Use Topics   Alcohol use: Never   Drug use: Yes    Types: Marijuana    Comment: occasionally   Current Outpatient Medications  Medication Sig Dispense Refill   ACCU-CHEK GUIDE test strip 3 (three) times daily.     albuterol (ACCUNEB) 1.25 MG/3ML nebulizer solution Take 1 ampule by nebulization 4 (four) times daily as needed for wheezing or shortness of breath.     albuterol (VENTOLIN HFA) 108 (90 Base) MCG/ACT inhaler Inhale 2 puffs into the lungs every 6 (six) hours as needed for wheezing or shortness of breath.     Continuous Glucose Receiver (FREESTYLE LIBRE 3 READER) DEVI Inject 1 kit into the skin every 14 (fourteen) days.     diclofenac (VOLTAREN) 75 MG EC tablet Take 1 tablet (75 mg total) by mouth 2 (two) times daily. 60 tablet 1   fenofibrate (TRICOR) 145 MG tablet Take  145 mg by mouth daily.     HYDROcodone-acetaminophen (NORCO) 7.5-325 MG tablet Take 1 tablet by mouth every 6 (six) hours as needed for moderate pain. 120 tablet 0   insulin aspart (NOVOLOG FLEXPEN) 100 UNIT/ML FlexPen Inject 3 Units into the skin 3 (three) times daily with meals. (Patient taking differently: Inject 10-16 Units into the skin 3 (three) times daily with meals.) 15 mL 0   insulin degludec (TRESIBA FLEXTOUCH) 100 UNIT/ML FlexTouch Pen Inject 25 Units into the skin daily.     linaclotide (LINZESS) 145 MCG CAPS capsule Take 1 capsule (145 mcg total) by mouth daily before breakfast. 90 capsule 0   metFORMIN (GLUCOPHAGE) 1000 MG tablet Take 1,000 mg by mouth 2 (two) times daily with a meal.     nitroGLYCERIN (NITROSTAT) 0.4 MG SL tablet Place 1 tablet (0.4 mg total) under the tongue every 5 (five) minutes as needed for chest pain. 25 tablet 3   OLANZapine (ZYPREXA) 10 MG tablet TAKE 1 TABLET BY MOUTH EVERYDAY AT BEDTIME 90 tablet 2   omeprazole (PRILOSEC) 40 MG capsule Take 1 capsule (40 mg total) by mouth 2 (two) times daily. For 8 weeks 90 capsule 3   pravastatin (PRAVACHOL) 40 MG tablet Take 1 tablet (40 mg total) by mouth daily. 90 tablet 3   QUEtiapine (SEROQUEL) 400 MG tablet Take 800 mg by mouth at bedtime.     insulin glargine (LANTUS) 100 UNIT/ML injection Inject 25 Units into the skin at bedtime. (Patient not taking: Reported on 05/13/2023)     omeprazole (PRILOSEC) 20 MG capsule Take 1 capsule (20 mg total) by mouth daily. 30 capsule 2   ondansetron (ZOFRAN) 8 MG tablet TAKE 1 TABLET (8 MG TOTAL) BY MOUTH EVERY 8 (EIGHT) HOURS AS NEEDED FOR NAUSEA OR VOMITING. TAKE 1 TABLETS (8MG  TOTAL) BY MOUTH EVERY 8HRS AS NEEDED. START ON THE THIRD DAY AFTER CHEMOTHERAPY. 30 tablet 1   No current facility-administered medications for this visit.   Facility-Administered Medications Ordered in Other Visits  Medication Dose Route Frequency Provider Last Rate Last Admin   sodium chloride flush  (NS) 0.9 % injection 10 mL  10 mL Intravenous PRN [name], MD   10 mL at 02/02/22 0848   sodium chloride flush (NS) 0.9 % injection 10 mL  10 mL Intravenous PRN [name], MD   10 mL at 02/15/22 1219   sodium chloride flush (NS) 0.9 % injection 10 mL  10 mL Intravenous PRN [name], MD   10 mL at 04/13/22 1241   sodium chloride flush (NS) 0.9 % injection 10 mL  10 mL Intravenous PRN [name], MD   10 mL at 05/25/22 1214   Allergies  Allergen Reactions   Lithium Other (See Comments)    Spinal fluid built up in brain   Dulaglutide Nausea And Vomiting and Other (See Comments)   Penicillins Other (See Comments)    UNKNOWN CHILDHOOD REACTION   Physical Exam: BP 122/70   Pulse 94   Ht 5\' 2"  (1.575 m)   Wt 191 lb (86.6 kg)   LMP 10/04/2016 Comment: after "procedure to stop bleeding"  BMI 34.93 kg/m  Constitutional: Pleasant,well-developed, female in no acute distress. HEENT: Normocephalic and atraumatic. Conjunctivae are normal. No scleral icterus. Cardiovascular: Normal rate Pulmonary/chest: Effort normal and breath sounds normal. No wheezing, rales or rhonchi. Abdominal: Soft, nondistended, nontender. Bowel sounds active throughout. There are no masses palpable. No hepatomegaly. Extremities: No edema Neurological: Alert and oriented to person place and time. Skin: Skin is warm and dry. No rashes noted. Psychiatric: Normal mood and affect. Behavior is normal.  Labs 01/2023: CBC nml. CMP with elevated glucose of 253 and elevated Cr of 1.08.   CT A/P 08/14/20: IMPRESSION: Vascular findings: 1. Negative for aortic aneurysm or dissection. 2. Small wedge-shaped areas of decreased enhancement within the inferior poles of the right kidney and left kidney, concerning for areas of focal renal infarction. Pyelonephritis could have a similar appearance. Correlation with urinalysis is recommended. 3. Focal 11 x 5 mm area of noncalcified atherosclerotic plaque with a  slightly polypoid configuration projecting into the lumen of the distal descending thoracic aorta. This may be at risk for potential plaque thrombosis. Nonvascular findings: 1. Findings of acute uncomplicated pancreatitis. No evidence of pancreatic necrosis or peripancreatic fluid collection. Correlate with serum lipase. 2. Findings suggestive of hepatic steatosis. 3. Indeterminate 1.8 cm rounded area of hypoattenuation within the anterior aspect of the spleen. Further evaluation with contrast-enhanced MRI can be performed on a nonemergent basis. 4. Slightly mottled appearance of the left third rib anteriorly with some adjacent scarring in the adjacent lung, potentially representing post radiation changes. 5. Left breast skin thickening with suspected lumpectomy site at the inner left breast. Continued mammographic follow-up is recommended.    PET CT 11/29/22: IMPRESSION: 1. No evidence breast cancer recurrence on FDG PET scan. 2. Postradiation change in the LEFT upper lobe is stable. 3. Stable small nodule in the RIGHT upper lobe. 4. No skeletal metastasis  EGD 03/22/23: - Normal esophagus.  - Hiatal hernia.  - Small amount of bilious gastric fluid.  - Erythematous mucosa in the antrum. Biopsied.  - Duodenal mucosal lymphangiectasia. Biopsied. Path: 1. Surgical [P], small bowel DUODENAL MUCOSA WITH NORMAL VILLOUS ARCHITECTURE. NO VILLOUS ATROPHY OR INCREASED INTRAEPITHELIAL LYMPHOCYTES. 2. Surgical [P], gastric ANTRAL AND OXYNTIC MUCOSA WITH MILD CHANGES OF REACTIVE GASTROPATHY. NEGATIVE FOR HELICOBACTER PYLORI.  ASSESSMENT AND PLAN: N&V GERD Hiatal hernia Constipation Patient presents with N&V, which is suspected to be multifactorial deu to GERD, constipation, blood sugar fluctuations, marijuana use, and/or gastroparesis. She has found some benefit from PPI BID therapy and on recent EGD she was found to have a hiatal hernia. Will add on H2 blocker therapy to see if this helps further with  GERD control. Her linzess seems to be helping with constipation but she is still having one BM per week. Thus will go up on the dosage of  her Linzess. Since patient has been vomiting up her Linzess, will have her try the ODT version to see if this helps keep her nausea under control. I do wonder if some reactive airway disease may be contributing to her symptoms as well since patient seems to cough before vomiting at times. - Previously gave GERD handout - Previously encouraged drink 8 cups of water per day, walk 30 minutes per day, and take daily fiber supplement - Increase Linzess from 145 mcg every day to 290 mcg QD - Continue PPI BID. Refill - Start famotidine 20 mg BID  - Switch Zofran PRN to ODT version - Patient will work with PCP to get blood sugars under better control - Avoid marijuana use - Next colonoscopy for colon cancer screening due in 2027 - RTC in 3 months  Eulah Pont, MD  I spent 40 minutes of time, including in depth chart review, independent review of results as outlined above, communicating results with the patient directly, face-to-face time with the patient, coordinating care, ordering studies and medications as appropriate, and documentation.

## 2023-05-13 NOTE — Patient Instructions (Addendum)
We have sent the following medications to your pharmacy for you to pick up at your convenience: Zofran, Linzess, Omeprazole, Famotidine  You are scheduled for a follow up visit on 08/22/23 at 1:50 pm  If your blood pressure at your visit was 140/90 or greater, please contact your primary care physician to follow up on this.  _______________________________________________________  If you are age 55 or older, your body mass index should be between 23-30. Your Body mass index is 34.93 kg/m. If this is out of the aforementioned range listed, please consider follow up with your Primary Care Provider.  If you are age 55 or younger, your body mass index should be between 19-25. Your Body mass index is 34.93 kg/m. If this is out of the aformentioned range listed, please consider follow up with your Primary Care Provider.   ________________________________________________________  The [facility] providers would like to encourage you to use [facility] to communicate with providers for non-urgent requests or questions.  Due to long hold times on the telephone, sending your provider a message by [facility] may be a faster and more efficient way to get a response.  Please allow 48 business hours for a response.  Please remember that this is for non-urgent requests.  _______________________________________________________   Thank you for entrusting me with your care and for choosing [facility],  Dr. Eulah Pont

## 2023-05-18 ENCOUNTER — Inpatient Hospital Stay: Payer: Medicaid Other | Attending: Hematology & Oncology

## 2023-05-18 ENCOUNTER — Encounter: Payer: Self-pay | Admitting: *Deleted

## 2023-05-18 VITALS — BP 149/75 | HR 99 | Temp 99.2°F | Resp 18

## 2023-05-18 DIAGNOSIS — Z452 Encounter for adjustment and management of vascular access device: Secondary | ICD-10-CM | POA: Diagnosis present

## 2023-05-18 DIAGNOSIS — C50011 Malignant neoplasm of nipple and areola, right female breast: Secondary | ICD-10-CM

## 2023-05-18 DIAGNOSIS — Z853 Personal history of malignant neoplasm of breast: Secondary | ICD-10-CM | POA: Insufficient documentation

## 2023-05-18 MED ORDER — HEPARIN SOD (PORK) LOCK FLUSH 100 UNIT/ML IV SOLN
500.0000 [IU] | Freq: Once | INTRAVENOUS | Status: AC
  Administered 2023-05-18: 500 [IU] via INTRAVENOUS

## 2023-05-18 MED ORDER — SODIUM CHLORIDE 0.9% FLUSH
10.0000 mL | Freq: Once | INTRAVENOUS | Status: AC
  Administered 2023-05-18: 10 mL

## 2023-05-18 NOTE — Patient Instructions (Signed)

## 2023-05-30 ENCOUNTER — Other Ambulatory Visit (HOSPITAL_BASED_OUTPATIENT_CLINIC_OR_DEPARTMENT_OTHER): Payer: Self-pay

## 2023-05-30 ENCOUNTER — Other Ambulatory Visit: Payer: Self-pay | Admitting: Hematology & Oncology

## 2023-05-30 DIAGNOSIS — M25512 Pain in left shoulder: Secondary | ICD-10-CM

## 2023-05-30 DIAGNOSIS — C50011 Malignant neoplasm of nipple and areola, right female breast: Secondary | ICD-10-CM

## 2023-05-30 MED ORDER — HYDROCODONE-ACETAMINOPHEN 7.5-325 MG PO TABS
1.0000 | ORAL_TABLET | Freq: Four times a day (QID) | ORAL | 0 refills | Status: DC | PRN
Start: 2023-05-30 — End: 2023-06-28
  Filled 2023-05-30: qty 120, 30d supply, fill #0

## 2023-06-10 ENCOUNTER — Ambulatory Visit
Admission: RE | Admit: 2023-06-10 | Discharge: 2023-06-10 | Disposition: A | Discharge status: Home / Self Care | Payer: Medicaid Other | Source: Ambulatory Visit | Attending: Surgery | Admitting: Surgery

## 2023-06-10 ENCOUNTER — Other Ambulatory Visit: Payer: Self-pay | Admitting: Surgery

## 2023-06-10 DIAGNOSIS — C50912 Malignant neoplasm of unspecified site of left female breast: Secondary | ICD-10-CM

## 2023-06-10 MED ORDER — GADOPICLENOL 0.5 MMOL/ML IV SOLN
8.0000 mL | Freq: Once | INTRAVENOUS | Status: AC | PRN
  Administered 2023-06-10: 8 mL via INTRAVENOUS

## 2023-06-12 ENCOUNTER — Emergency Department (HOSPITAL_BASED_OUTPATIENT_CLINIC_OR_DEPARTMENT_OTHER)
Admission: EM | Admit: 2023-06-12 | Discharge: 2023-06-12 | Disposition: A | Discharge status: Home / Self Care | Payer: Medicaid Other | Attending: Emergency Medicine | Admitting: Emergency Medicine

## 2023-06-12 ENCOUNTER — Encounter (HOSPITAL_BASED_OUTPATIENT_CLINIC_OR_DEPARTMENT_OTHER): Payer: Self-pay | Admitting: Emergency Medicine

## 2023-06-12 ENCOUNTER — Emergency Department (HOSPITAL_BASED_OUTPATIENT_CLINIC_OR_DEPARTMENT_OTHER): Payer: Medicaid Other

## 2023-06-12 ENCOUNTER — Other Ambulatory Visit: Payer: Self-pay

## 2023-06-12 ENCOUNTER — Emergency Department (HOSPITAL_BASED_OUTPATIENT_CLINIC_OR_DEPARTMENT_OTHER): Payer: Medicaid Other | Admitting: Radiology

## 2023-06-12 DIAGNOSIS — Z853 Personal history of malignant neoplasm of breast: Secondary | ICD-10-CM | POA: Diagnosis not present

## 2023-06-12 DIAGNOSIS — Z794 Long term (current) use of insulin: Secondary | ICD-10-CM | POA: Diagnosis not present

## 2023-06-12 DIAGNOSIS — R252 Cramp and spasm: Secondary | ICD-10-CM | POA: Diagnosis not present

## 2023-06-12 DIAGNOSIS — M25512 Pain in left shoulder: Secondary | ICD-10-CM | POA: Diagnosis present

## 2023-06-12 LAB — BASIC METABOLIC PANEL
Anion gap: 13 (ref 5–15)
BUN: 14 mg/dL (ref 6–20)
CO2: 20 mmol/L — ABNORMAL LOW (ref 22–32)
Calcium: 10.4 mg/dL — ABNORMAL HIGH (ref 8.9–10.3)
Chloride: 102 mmol/L (ref 98–111)
Creatinine, Ser: 0.82 mg/dL (ref 0.44–1.00)
GFR, Estimated: >60 mL/min (ref >60)
Glucose, Bld: 179 mg/dL — ABNORMAL HIGH (ref 70–99)
Potassium: 3.8 mmol/L (ref 3.5–5.1)
Sodium: 135 mmol/L (ref 135–145)

## 2023-06-12 LAB — CBC
HCT: 37.8 % (ref 36.0–46.0)
Hemoglobin: 13.3 g/dL (ref 12.0–15.0)
MCH: 31 pg (ref 26.0–34.0)
MCHC: 35.2 g/dL (ref 30.0–36.0)
MCV: 88.1 fL (ref 80.0–100.0)
Platelets: 351 10*3/uL (ref 150–400)
RBC: 4.29 MIL/uL (ref 3.87–5.11)
RDW: 14.7 % (ref 11.5–15.5)
WBC: 7.5 10*3/uL (ref 4.0–10.5)
nRBC: 0 % (ref 0.0–0.2)

## 2023-06-12 LAB — TROPONIN I (HIGH SENSITIVITY)
Troponin I (High Sensitivity): 4 ng/L (ref <18)
Troponin I (High Sensitivity): 4 ng/L (ref <18)

## 2023-06-12 MED ORDER — CYCLOBENZAPRINE HCL 10 MG PO TABS: 10.0000 mg | ORAL_TABLET | Freq: Two times a day (BID) | ORAL | 0 refills | Status: DC | PRN

## 2023-06-12 MED ORDER — LIDOCAINE 5 % EX PTCH
2.0000 | MEDICATED_PATCH | CUTANEOUS | Status: DC
  Administered 2023-06-12: 2 via TRANSDERMAL
  Filled 2023-06-12: qty 2

## 2023-06-12 MED ORDER — KETOROLAC TROMETHAMINE 30 MG/ML IJ SOLN
30.0000 mg | Freq: Once | INTRAMUSCULAR | Status: AC
  Administered 2023-06-12: 30 mg via INTRAMUSCULAR
  Filled 2023-06-12: qty 1

## 2023-06-12 MED ORDER — IBUPROFEN 800 MG PO TABS: 800.0000 mg | ORAL_TABLET | Freq: Three times a day (TID) | ORAL | 0 refills | Status: DC

## 2023-06-12 MED ORDER — CYCLOBENZAPRINE HCL 10 MG PO TABS
10.0000 mg | ORAL_TABLET | Freq: Once | ORAL | Status: AC
  Administered 2023-06-12: 10 mg via ORAL
  Filled 2023-06-12: qty 1

## 2023-06-12 MED ORDER — ONDANSETRON 4 MG PO TBDP
8.0000 mg | ORAL_TABLET | Freq: Once | ORAL | Status: AC
  Administered 2023-06-12: 8 mg via ORAL
  Filled 2023-06-12: qty 2

## 2023-06-12 MED ORDER — LIDOCAINE 5 % EX PTCH: 1.0000 | MEDICATED_PATCH | CUTANEOUS | 0 refills | Status: DC

## 2023-06-12 NOTE — ED Provider Notes (Signed)
[facility] AT [facility] Provider Note   CSN: [id number] Arrival date & time: 06/12/23  1432     History  Chief Complaint  Patient presents with   Shoulder Pain    Ariana Herrera is a 57 y.o. female.   Shoulder Pain   57 year old female presents emergency department with complaints of left-sided shoulder pain.  Patient states that she was at the imaging center on Friday performing MRI of her breast for follow-up regarding her breast cancer.  Patient with stage IV breast cancer in remission not currently receiving immunotherapy/chemotherapy.  Patient states she has baseline tendinitis in her left shoulder which gives her problems but was told to hold her shoulder in a fixed position with her arm above her head for prolonged amount of time.  States that since then, has had tightening/spasming of the muscles of her left shoulder.  States has been taking her at home Motrin which has been helping minimally.  Presents emergency department for further assessment.  Denies any known trauma to the left shoulder.  Denies any weakness/sensory deficits in the hand.  Past medical history significant of bipolar 1 disorder, hyperlipidemia, lymphedema, breast cancer, obesity, NSTEMI, DVT, OSA  Home Medications Prior to Admission medications   Medication Sig Start Date End Date Taking? Authorizing Provider  cyclobenzaprine (FLEXERIL) 10 MG tablet Take 1 tablet (10 mg total) by mouth 2 (two) times daily as needed for muscle spasms. 06/12/23  Yes Sherian Maroon A, PA  ibuprofen (ADVIL) 800 MG tablet Take 1 tablet (800 mg total) by mouth 3 (three) times daily. 06/12/23  Yes Sherian Maroon A, PA  lidocaine (LIDODERM) 5 % Place 1 patch onto the skin daily. Remove & Discard patch within 12 hours or as directed by MD 06/12/23  Yes Peter Garter, PA  ACCU-CHEK GUIDE test strip 3 (three) times daily. 07/17/21   [provider]  albuterol (ACCUNEB) 1.25 MG/3ML nebulizer  solution Take 1 ampule by nebulization 4 (four) times daily as needed for wheezing or shortness of breath. 02/27/23   [provider]  albuterol (VENTOLIN HFA) 108 (90 Base) MCG/ACT inhaler Inhale 2 puffs into the lungs every 6 (six) hours as needed for wheezing or shortness of breath.    [provider]  Continuous Glucose Receiver (FREESTYLE LIBRE 3 READER) DEVI Inject 1 kit into the skin every 14 (fourteen) days. 01/25/23   [provider]  diclofenac (VOLTAREN) 75 MG EC tablet Take 1 tablet (75 mg total) by mouth 2 (two) times daily. 04/05/23   Hudnall, Azucena Fallen, MD  famotidine (PEPCID) 20 MG tablet Take 1 tablet (20 mg total) by mouth 2 (two) times daily. 05/13/23 07/12/23  Imogene Burn, MD  fenofibrate (TRICOR) 145 MG tablet Take 145 mg by mouth daily. 11/22/21   [provider]  HYDROcodone-acetaminophen (NORCO) 7.5-325 MG tablet Take 1 tablet by mouth every 6 (six) hours as needed for moderate pain. 05/30/23   [name], MD  insulin aspart (NOVOLOG FLEXPEN) 100 UNIT/ML FlexPen Inject 3 Units into the skin 3 (three) times daily with meals. Patient taking differently: Inject 10-16 Units into the skin 3 (three) times daily with meals. 08/25/20   Calvert Cantor, MD  insulin degludec (TRESIBA FLEXTOUCH) 100 UNIT/ML FlexTouch Pen Inject 25 Units into the skin daily. Patient not taking: Reported on 05/18/2023 02/10/23   [provider]  insulin glargine (LANTUS) 100 UNIT/ML injection Inject 25 Units into the skin at bedtime. Patient not taking: Reported on  05/18/2023    [provider]  linaclotide Karlene Einstein) 290 MCG CAPS capsule Take 1 capsule (290 mcg total) by mouth daily before breakfast. Patient not taking: Reported on 05/18/2023 05/13/23   Imogene Burn, MD  metFORMIN (GLUCOPHAGE) 1000 MG tablet Take 1,000 mg by mouth 2 (two) times daily with a meal. 11/26/19   [provider]  nitroGLYCERIN (NITROSTAT) 0.4 MG SL tablet Place 1 tablet (0.4 mg  total) under the tongue every 5 (five) minutes as needed for chest pain. 09/04/20   Ronney Asters, NP  OLANZapine (ZYPREXA) 10 MG tablet TAKE 1 TABLET BY MOUTH EVERYDAY AT BEDTIME 12/15/21   [name], MD  omeprazole (PRILOSEC) 20 MG capsule Take 1 capsule (20 mg total) by mouth daily. 02/07/23   Imogene Burn, MD  omeprazole (PRILOSEC) 40 MG capsule Take 1 capsule (40 mg total) by mouth 2 (two) times daily. For 8 weeks 05/13/23   Imogene Burn, MD  ondansetron (ZOFRAN) 8 MG tablet TAKE 1 TABLET (8 MG TOTAL) BY MOUTH EVERY 8 (EIGHT) HOURS AS NEEDED FOR NAUSEA OR VOMITING. TAKE 1 TABLETS (8MG  TOTAL) BY MOUTH EVERY 8HRS AS NEEDED. START ON THE THIRD DAY AFTER CHEMOTHERAPY. 03/04/23   [name], MD  ondansetron (ZOFRAN-ODT) 4 MG disintegrating tablet Take 1 tablet (4 mg total) by mouth every 8 (eight) hours as needed for nausea or vomiting. 05/13/23   Imogene Burn, MD  pravastatin (PRAVACHOL) 40 MG tablet Take 1 tablet (40 mg total) by mouth daily. 09/10/22   Hilty, Lisette Abu, MD  QUEtiapine (SEROQUEL) 400 MG tablet Take 800 mg by mouth at bedtime.    [provider]      Allergies    Lithium, Dulaglutide, and Penicillins    Review of Systems   Review of Systems  All other systems reviewed and are negative.   Physical Exam Updated Vital Signs BP (!) 149/83 (BP Location: Right Arm)   Pulse 93   Temp 98.4 F (36.9 C)   Resp 17   LMP 10/04/2016 Comment: after "procedure to stop bleeding"  SpO2 99%  Physical Exam Vitals and nursing note reviewed.  Constitutional:      General: She is not in acute distress.    Appearance: She is well-developed.  HENT:     Head: Normocephalic and atraumatic.  Eyes:     Conjunctiva/sclera: Conjunctivae normal.  Cardiovascular:     Rate and Rhythm: Normal rate and regular rhythm.     Heart sounds: No murmur heard. Pulmonary:     Effort: Pulmonary effort is normal. No respiratory distress.     Breath sounds: Normal breath sounds.   Abdominal:     Palpations: Abdomen is soft.     Tenderness: There is no abdominal tenderness.  Musculoskeletal:        General: No swelling.     Cervical back: Neck supple.     Comments: Patient with obvious spasming of left-sided trapezial ridge.  Limited range of motion of left shoulder secondary to pain.  Symmetric strength in grip/elbow flexion/extension.  No sensory deficits along major nerve regions of upper extremities.  Radial pulses 2+ bilaterally.  No overlying erythema, palp fluctuance, induration.  No pitting edema.  Some bony tenderness of left proximal humerus.  Skin:    General: Skin is warm and dry.     Capillary Refill: Capillary refill takes less than 2 seconds.  Neurological:     Mental Status: She is alert.  Psychiatric:  Mood and Affect: Mood normal.     ED Results / Procedures / Treatments   Labs (all labs ordered are listed, but only abnormal results are displayed) Labs Reviewed  BASIC METABOLIC PANEL - Abnormal; Notable for the following components:      Result Value   CO2 20 (*)    Glucose, Bld 179 (*)    Calcium 10.4 (*)    All other components within normal limits  CBC  TROPONIN I (HIGH SENSITIVITY)  TROPONIN I (HIGH SENSITIVITY)    EKG None  Radiology DG Chest Port 1 View  Result Date: 06/12/2023 CLINICAL DATA:  Chest pain EXAM: PORTABLE CHEST 1 VIEW COMPARISON:  08/20/2020 FINDINGS: Right chest port catheter. The heart size and mediastinal contours are within normal limits. Both lungs are clear. The visualized skeletal structures are unremarkable. IMPRESSION: No acute abnormality of the lungs in AP portable projection. Electronically Signed   By: Jearld Lesch M.D.   On: 06/12/2023 17:26   DG Shoulder Left  Result Date: 06/12/2023 CLINICAL DATA:  Left shoulder pain. EXAM: LEFT SHOULDER - 2+ VIEW COMPARISON:  MR shoulder dated 03/11/2022 FINDINGS: There is no evidence of fracture or dislocation. 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 Soft tissues are unremarkable. IMPRESSION: Mild acromioclavicular osteoarthritis. Electronically Signed   By: Romona Curls M.D.   On: 06/12/2023 16:00    Procedures Procedures    Medications Ordered in ED Medications  lidocaine (LIDODERM) 5 % 2 patch (2 patches Transdermal Patch Applied 06/12/23 1625)  ketorolac (TORADOL) 30 MG/ML injection 30 mg (30 mg Intramuscular Given 06/12/23 1619)  cyclobenzaprine (FLEXERIL) tablet 10 mg (10 mg Oral Given 06/12/23 1624)  ondansetron (ZOFRAN-ODT) disintegrating tablet 8 mg (8 mg Oral Given 06/12/23 1816)    ED Course/ Medical Decision Making/ A&P Clinical Course as of 06/12/23 1857  Sun Jun 12, 2023  1626 While awaiting for discharge, patient developed left-sided sharp chest pain.  Will obtain imaging patient's chest, EKG, and labs. [CR]  1744 Reevaluation of the patient showed significant improvement of symptoms after medicines were administered.  Awaiting second troponin for ACS rule out with expected discharge if negative. [CR]    Clinical Course User Index [CR] Peter Garter, PA                                 Medical Decision Making Amount and/or Complexity of Data Reviewed Labs: ordered. Radiology: ordered.  Risk Prescription drug management.   This patient presents to the ED for concern of shoulder pain, this involves an extensive number of treatment options, and is a complaint that carries with it a high risk of complications and morbidity.  The differential diagnosis includes fracture, strain/pain, dislocation, cellulitis, erysipelas, necrotizing fasciitis, ischemic limb, DVT, septic arthritis, compartment syndrome, rotator cuff injury   Co morbidities that complicate the patient evaluation  See HPI   Additional history obtained:  Additional history obtained from EMR External records from outside source obtained and reviewed including hospital records   Lab Tests:  No leukocytosis.  No evidence of  anemia.  Platelets within range.  No electrolyte abnormalities besides mild hypercalcemia 10.4.  No renal dysfunction.  Initial troponin of 4 with repeat pending   Imaging Studies ordered:  I ordered imaging studies including shoulder x-ray, chest x-ray I independently visualized and interpreted imaging which showed  Left shoulder x-ray: AC osteoarthritis.  No acute abnormality otherwise. Chest x-ray: No acute cardiopulmonary abnormalities. I  agree with the radiologist interpretation   Cardiac Monitoring: / EKG:  The patient was maintained on a cardiac monitor.  I personally viewed and interpreted the cardiac monitored which showed an underlying rhythm of: Sinus rhythm   Consultations Obtained:  N/a   Problem List / ED Course / Critical interventions / Medication management  Left shoulder pain I ordered medication including Lidoderm, Flexeril, Toradol, Zofran  Reevaluation of the patient after these medicines showed that the patient improved I have reviewed the patients home medicines and have made adjustments as needed   Social Determinants of Health:  Chronic cigarette use.  Denies illicit drug use.   Test / Admission - Considered:  Left shoulder pain Vitals signs significant for hypertension blood pressure 149/83. Otherwise within normal range and stable throughout visit. Laboratory/imaging studies significant for: See above 57 year old female presents emergency department with complaints of left shoulder pain.  Left shoulder pain has been present since Friday as she was told to lay with her left arm above her head while getting an MRI of her breasts.  She states that pain began when she was in position for prolonged amount of time, having to stop the MRI before was completely finished due to moderate discomfort.  On exam, patient without overlying skin changes concerning for secondary infectious process.  No pulse deficit concerning for ischemic limb.  No upper extremity  swelling concerning for DVT.  Patient was with appreciable muscular spasm of left-sided trapezius as well as some tenderness of left sided proximal humerus.  X-ray imaging was negative for any acute fracture or dislocation of left-sided shoulder but with some AC osteoarthritis.  While awaiting for discharge instructions, patient developed some left-sided chest pain as well as some shortness of breath.  Given history of STEMI/NSTEMI, further workup via laboratory studies, EKG and imaging studies was obtained.  Patient with initial negative troponin, lack of acute ischemic changes on EKG; low suspicion for ACS but pending second troponin at time of shift change..  Chest x-ray without abnormality.  Patient chest pain sharp and only lasting a matter of seconds/minute before spontaneous resolution.  Per patient, she felt extremely anxious upon receiving discharge instructions and felt like her left-sided chest pain is because of increased anxious feelings.  Patient has noted significant improvement of left-sided shoulder pain with administration of the medicines while in the emergency department.  Patient is on chronic opiate medication at home due to history of chronic pain.  Will refrain from additional narcotic drugs.  Will send patient home with anti-inflammatory, muscle laxer and topical numbing patches.  Will recommend close follow-up with primary care in the outpatient setting for reevaluation.  Treatment plan discussed at length with patient and she acknowledged understanding was agreeable to said plan.  Patient overall well-appearing, afebrile in no acute distress.        Final Clinical Impression(s) / ED Diagnoses Final diagnoses:  Acute pain of left shoulder    Rx / DC Orders ED Discharge Orders          Ordered    ibuprofen (ADVIL) 800 MG tablet  3 times daily        06/12/23 1608    cyclobenzaprine (FLEXERIL) 10 MG tablet  2 times daily PRN        06/12/23 1608    lidocaine (LIDODERM) 5  %  Every 24 hours        06/12/23 1608              Sherian Maroon  A, PA 06/12/23 1857    Melene Plan, DO 06/12/23 1907

## 2023-06-12 NOTE — ED Notes (Signed)
Reviewed AVS with patient, patient expressed understanding of directions, denies further questions at this time. 

## 2023-06-12 NOTE — ED Triage Notes (Addendum)
Pt presents to ED POV. Pt c/o L shoulder pain that began Friday. Pt reports that she had mammogram on Friday and had to raise her hand and pain began. Hx of tendinitis in same shoulder. L radial palpated and ausculted w/ doppler

## 2023-06-12 NOTE — ED Provider Notes (Signed)
Accepted handoff at shift change from [facility], [place]. Please see prior provider note for more detail.   Plan: follow up on second troponin.  Second troponin is negative, this is likely musculoskeletal and less likely cardiac in nature.  She reports her pain has improved.  She is lying on the stretcher in no acute distress on her phone.  Patient stable for discharge home.    Achille Rich, PA-C 06/12/23 1948    Melene Plan, DO 06/12/23 2132

## 2023-06-12 NOTE — ED Notes (Addendum)
Pt reports sudden onset of Chest Pain "a few minutes" prior to RN entering room, central CP. +h/o angina. EKG ordered prior to discharge.  MD and PA-C notified.

## 2023-06-12 NOTE — ED Notes (Signed)
Patient resting quietly in stretcher, respirations even, unlabored, no acute distress noted, reports sudden arm pain 9/10, PA Riley notified.

## 2023-06-12 NOTE — Discharge Instructions (Addendum)
As discussed, x-ray was negative for any fracture or dislocation.  You have arthritis in your left shoulder.  Will treat your symptoms with anti-inflammatories, muscle laxer as needed as well as topical numbing patches.  Note that muscle laxer can cause drowsiness so please do not drive or perform any high risk activity while taking said medication and to realize its effects on you.  You may continue to take your hydrocodone at home as needed.  Will also send you home with a sling but make sure to still perform range of motion exercises with your left shoulder so as to not develop frozen shoulder.  Recommend follow-up with your orthopedics Dr. Pearletha Forge regarding your symptoms for reassessment.  Please do not hesitate to return to the emergency department if the worrisome signs and symptoms we discussed to become apparent.

## 2023-06-14 ENCOUNTER — Other Ambulatory Visit: Payer: Medicaid Other

## 2023-06-22 ENCOUNTER — Other Ambulatory Visit: Payer: Self-pay

## 2023-06-22 ENCOUNTER — Other Ambulatory Visit: Payer: Self-pay | Admitting: Hematology & Oncology

## 2023-06-22 DIAGNOSIS — C50011 Malignant neoplasm of nipple and areola, right female breast: Secondary | ICD-10-CM

## 2023-06-23 ENCOUNTER — Encounter: Payer: Self-pay | Admitting: Physician Assistant

## 2023-06-23 ENCOUNTER — Ambulatory Visit (INDEPENDENT_AMBULATORY_CARE_PROVIDER_SITE_OTHER): Payer: Medicaid Other | Admitting: Physician Assistant

## 2023-06-23 VITALS — BP 124/82 | HR 112 | Temp 98.4°F | Resp 20 | Ht 62.0 in | Wt 186.8 lb

## 2023-06-23 DIAGNOSIS — R221 Localized swelling, mass and lump, neck: Secondary | ICD-10-CM

## 2023-06-23 DIAGNOSIS — Z794 Long term (current) use of insulin: Secondary | ICD-10-CM

## 2023-06-23 DIAGNOSIS — N393 Stress incontinence (female) (male): Secondary | ICD-10-CM

## 2023-06-23 DIAGNOSIS — E785 Hyperlipidemia, unspecified: Secondary | ICD-10-CM

## 2023-06-23 DIAGNOSIS — E1169 Type 2 diabetes mellitus with other specified complication: Secondary | ICD-10-CM

## 2023-06-23 DIAGNOSIS — J4540 Moderate persistent asthma, uncomplicated: Secondary | ICD-10-CM | POA: Diagnosis not present

## 2023-06-23 DIAGNOSIS — E1165 Type 2 diabetes mellitus with hyperglycemia: Secondary | ICD-10-CM | POA: Diagnosis not present

## 2023-06-23 DIAGNOSIS — E669 Obesity, unspecified: Secondary | ICD-10-CM | POA: Insufficient documentation

## 2023-06-23 DIAGNOSIS — F319 Bipolar disorder, unspecified: Secondary | ICD-10-CM

## 2023-06-23 LAB — URINALYSIS, MICROSCOPIC ONLY: RBC / HPF: NONE SEEN (ref 0–?)

## 2023-06-23 LAB — MICROALBUMIN / CREATININE URINE RATIO
Creatinine,U: 100.1 mg/dL
Microalb Creat Ratio: 0.7 mg/g (ref 0.0–30.0)
Microalb, Ur: <0.7 mg/dL (ref 0.0–1.9)

## 2023-06-23 MED ORDER — OXYBUTYNIN CHLORIDE ER 5 MG PO TB24: 5.0000 mg | ORAL_TABLET | Freq: Every day | ORAL | 1 refills | Status: DC

## 2023-06-23 MED ORDER — PRAVASTATIN SODIUM 40 MG PO TABS
40.0000 mg | ORAL_TABLET | Freq: Every day | ORAL | 3 refills | Status: DC
Start: 2023-06-23 — End: 2023-09-29

## 2023-06-23 MED ORDER — ALBUTEROL SULFATE HFA 108 (90 BASE) MCG/ACT IN AERS
2.0000 | INHALATION_SPRAY | Freq: Four times a day (QID) | RESPIRATORY_TRACT | 2 refills | Status: AC | PRN
Start: 2023-06-23 — End: ?

## 2023-06-23 NOTE — Progress Notes (Signed)
New patient visit   Patient: Ariana Herrera   DOB: 05-09-1966   57 y.o. Female  MRN: [id number] Visit Date: 06/23/2023  Today's healthcare provider: Alfredia Ferguson, PA-C   Cc. New patient, bladder incontinence, establish care  Subjective    Ariana Herrera is a 57 y.o. female who presents today as a new patient to establish care.  HPI  H/o met ER/PR/Her2 negative L breast cancer, under chemo last year, most recent PET 03/04/23 NAD. Follows w/ oncology. History of MI 2021, on statin follows with cardio. History of DVT post op 2020.   Discussed the use of AI scribe software for clinical note transcription with the patient, who gave verbal consent to proceed.  History of Present Illness   The patient, with a history of metastatic breast cancer and diabetes, presents with concerns today over urinary incontinence that has been ongoing since 1998, post-childbirth. The incontinence is triggered by coughing, laughing, and bending over. The patient denies experiencing pain or seeing blood in the urine. The patient has not tried any medications for this issue before, only kegel exercises.  The patient also expresses concern about weight gain, despite having a decreased appetite due to diabetes. The patient reports a history of fluctuating weight, having been thin during the first bout of cancer but not losing weight during the second.    The patient is currently on Seroquel for bipolar disorder.  The patient also reports muscle pain in the bicep area and the left side of her neck, which has been ongoing for about a month. She has had a steroid injection and is following with ortho.  However, the patient also reports a lump in the neck, which is a new symptom. She reports her oncologist did not appreciate anything but she is still worried.  The patient is a smoker, consuming about a pack a day since the age of 34. The patient uses an albuterol inhaler, especially during the winter months,  and has a nebulizer at home.        Past Medical History:  Diagnosis Date   Anginal pain (HCC)    Anxiety    Arthritis    Asthma    Bipolar 1 disorder (HCC)    Bipolar disease, chronic (HCC)    Breast cancer in female (HCC)    2019 and recurrent in 2023 to right breast, right axillary, and left neck   Coronary artery disease    Deep vein blood clot of right lower extremity (HCC)    Diabetes mellitus type 2 in obese    Goals of care, counseling/discussion 11/23/2021   Hyperlipidemia    Lymphedema    MI (myocardial infarction) (HCC)    was in [place]   Obesity    Pancreatitis    Personal history of chemotherapy    Personal history of radiation therapy    Sleep apnea    Past Surgical History:  Procedure Laterality Date   BREAST LUMPECTOMY Left 03/2019   CATARACT EXTRACTION Bilateral    CHOLECYSTECTOMY     IR IMAGING GUIDED PORT INSERTION  12/15/2021   IR REMOVAL TUN ACCESS W/ PORT W/O FL MOD SED  12/20/2019   IR US GUIDE BX ASP/DRAIN  11/02/2021   LEFT HEART CATH AND CORONARY ANGIOGRAPHY N/A 08/22/2020   Procedure: LEFT HEART CATH AND CORONARY ANGIOGRAPHY;  Surgeon: Kathleene Hazel, MD;  Location: [facility];  Service: Cardiovascular;  Laterality: N/A;   TRIGGER FINGER RELEASE Bilateral    x  5   TUBAL LIGATION     UMBILICAL HERNIA REPAIR     x 2   uterine ablation     Family Status  Relation Name Status   Mother  Alive   Cousin  Alive   Neg Hx  (Not Specified)  No partnership data on file   Family History  Problem Relation Age of Onset   Heart attack Mother 90   Diabetes Mother    Breast cancer Mother    Breast cancer Cousin    Colon cancer Neg Hx    Esophageal cancer Neg Hx    Pancreatic cancer Neg Hx    Stomach cancer Neg Hx    Social History   Socioeconomic History   Marital status: Married    Spouse name: Not on file   Number of children: Not on file   Years of education: Not on file   Highest education level: Not on file   Occupational History   Occupation: disabled due to neuropathy and cancer treatments  Tobacco Use   Smoking status: Every Day    Current packs/day: 1.00    Average packs/day: 1 pack/day for 38.7 years (38.7 ttl pk-yrs)    Types: Cigarettes    Start date: 61   Smokeless tobacco: Never  Vaping Use   Vaping status: Never Used  Substance and Sexual Activity   Alcohol use: Never   Drug use: Yes    Types: Marijuana    Comment: occasionally   Sexual activity: Not on file  Other Topics Concern   Not on file  Social History Narrative   Not on file   Social Determinants of Health   Financial Resource Strain: High Risk (03/23/2023)   Received from [facility], [facility]   Overall Financial Resource Strain (CARDIA)    Difficulty of Paying Living Expenses: Hard  Food Insecurity: Food Insecurity Present (03/23/2023)   Received from [facility], [facility]   Hunger Vital Sign    Worried About Running Out of Food in the Last Year: Sometimes true    Ran Out of Food in the Last Year: Sometimes true  Transportation Needs: No Transportation Needs (03/23/2023)   Received from [employer], [facility]   PRAPARE - Transportation    Lack of Transportation (Medical): No    Lack of Transportation (Non-Medical): No  Physical Activity: Not on file  Stress: Not on file  Social Connections: Unknown (03/16/2023)   Received from [facility], [facility]   Social Network    Social Network: Not on file   Outpatient Medications Prior to Visit  Medication Sig   ACCU-CHEK GUIDE test strip 3 (three) times daily.   albuterol (ACCUNEB) 1.25 MG/3ML nebulizer solution Take 1 ampule by nebulization 4 (four) times daily as needed for wheezing or shortness of breath.   Continuous Glucose Receiver (FREESTYLE LIBRE 3 READER) DEVI Inject 1 kit into the skin every 14 (fourteen) days.   Continuous Glucose Sensor (FREESTYLE LIBRE 3 SENSOR) MISC 2 KITS BY MISCELLANEOUS ROUTE EVERY 14 (FOURTEEN) DAYS.    diclofenac (VOLTAREN) 75 MG EC tablet Take 1 tablet (75 mg total) by mouth 2 (two) times daily.   fenofibrate (TRICOR) 145 MG tablet Take 145 mg by mouth daily.   HYDROcodone-acetaminophen (NORCO) 7.5-325 MG tablet Take 1 tablet by mouth every 6 (six) hours as needed for moderate pain.   ibuprofen (ADVIL) 800 MG tablet Take 1 tablet (800 mg total) by mouth 3 (three) times daily.   insulin aspart (NOVOLOG FLEXPEN)  100 UNIT/ML FlexPen Inject 3 Units into the skin 3 (three) times daily with meals. (Patient taking differently: Inject 10-16 Units into the skin 3 (three) times daily with meals.)   insulin degludec (TRESIBA FLEXTOUCH) 100 UNIT/ML FlexTouch Pen Inject 25 Units into the skin daily.   insulin glargine (LANTUS) 100 UNIT/ML injection Inject 25 Units into the skin at bedtime.   lidocaine (LIDODERM) 5 % Place 1 patch onto the skin daily. Remove & Discard patch within 12 hours or as directed by MD   linaclotide (LINZESS) 290 MCG CAPS capsule Take 1 capsule (290 mcg total) by mouth daily before breakfast.   metFORMIN (GLUCOPHAGE) 1000 MG tablet Take 1,000 mg by mouth 2 (two) times daily with a meal.   nitroGLYCERIN (NITROSTAT) 0.4 MG SL tablet Place 1 tablet (0.4 mg total) under the tongue every 5 (five) minutes as needed for chest pain.   ondansetron (ZOFRAN) 8 MG tablet TAKE 1 TAB BY MOUTH EVERY 8 HOURS AS NEEDED FOR NAUSEA OR VOMITING. START ON THE THIRD DAY AFTER CHEMOTHERAPY.   QUEtiapine (SEROQUEL) 400 MG tablet Take 800 mg by mouth at bedtime.   [DISCONTINUED] albuterol (VENTOLIN HFA) 108 (90 Base) MCG/ACT inhaler Inhale 2 puffs into the lungs every 6 (six) hours as needed for wheezing or shortness of breath.   [DISCONTINUED] cyclobenzaprine (FLEXERIL) 10 MG tablet Take 1 tablet (10 mg total) by mouth 2 (two) times daily as needed for muscle spasms.   [DISCONTINUED] OLANZapine (ZYPREXA) 10 MG tablet TAKE 1 TABLET BY MOUTH EVERYDAY AT BEDTIME   [DISCONTINUED] pravastatin (PRAVACHOL) 40 MG  tablet Take 1 tablet (40 mg total) by mouth daily.   [DISCONTINUED] famotidine (PEPCID) 20 MG tablet Take 1 tablet (20 mg total) by mouth 2 (two) times daily. (Patient not taking: Reported on 06/23/2023)   [DISCONTINUED] sodium chloride flush (NS) 0.9 % injection 10 mL    [DISCONTINUED] sodium chloride flush (NS) 0.9 % injection 10 mL    [DISCONTINUED] sodium chloride flush (NS) 0.9 % injection 10 mL    [DISCONTINUED] sodium chloride flush (NS) 0.9 % injection 10 mL    No facility-administered medications prior to visit.   Allergies  Allergen Reactions   Lithium Other (See Comments)    Spinal fluid built up in brain   Dulaglutide Nausea And Vomiting and Other (See Comments)   Penicillins Other (See Comments)    UNKNOWN CHILDHOOD REACTION    There is no immunization history for the selected administration types on file for this patient.  Health Maintenance  Topic Date Due   COVID-19 Vaccine (1) Never done   FOOT EXAM  Never done   OPHTHALMOLOGY EXAM  Never done   Diabetic kidney evaluation - Urine ACR  Never done   Colonoscopy  Never done   Hepatitis C Screening  Never done   DTaP/Tdap/Td (1 - Tdap) Never done   Zoster Vaccines- Shingrix (1 of 2) Never done   PAP SMEAR-Modifier  Never done   HEMOGLOBIN A1C  02/18/2021   Lung Cancer Screening  08/20/2021   INFLUENZA VACCINE  05/26/2023   Diabetic kidney evaluation - eGFR measurement  06/11/2024   MAMMOGRAM  04/20/2025   HIV Screening  Completed   HPV VACCINES  Aged Out    Patient Care Team: Alfredia Ferguson, PA-C as PCP - General (Physician Assistant) Chrystie Nose, MD as PCP - Cardiology (Cardiology) [name], MD as Medical Oncologist (Oncology)  Review of Systems  Constitutional:  Negative for fatigue and fever.  Respiratory:  Negative for cough and shortness of breath.   Cardiovascular:  Negative for chest pain and leg swelling.  Gastrointestinal:  Negative for abdominal pain.  Genitourinary:  Positive for  difficulty urinating, frequency and urgency.  Neurological:  Negative for dizziness and headaches.       Objective    BP 124/82 (BP Location: Right Arm, Patient Position: Sitting, Cuff Size: Normal)   Pulse (!) 112   Temp 98.4 F (36.9 C) (Oral)   Resp 20   Ht 5\' 2"  (1.575 m)   Wt 186 lb 12.8 oz (84.7 kg)   LMP 10/04/2016 Comment: after "procedure to stop bleeding"  SpO2 98%   BMI 34.17 kg/m   Physical Exam Constitutional:      General: She is awake.     Appearance: She is well-developed.  HENT:     Head: Normocephalic.  Eyes:     Conjunctiva/sclera: Conjunctivae normal.  Neck:     Comments: L SCM is very hard, tender.  L inferior, anterior neck, ~ 3-4 cm above the clavicle, under her SCM pt appreciates a mass. It is very hard in this area, difficult to appreciate if these is a mass underneath the muscle. Pt also reports prior surgery in this area, scar tissue may be a component. Cardiovascular:     Rate and Rhythm: Normal rate and regular rhythm.     Heart sounds: Normal heart sounds.  Pulmonary:     Effort: Pulmonary effort is normal.     Breath sounds: Normal breath sounds.  Skin:    General: Skin is warm.  Neurological:     Mental Status: She is alert and oriented to person, place, and time.  Psychiatric:        Attention and Perception: Attention normal.        Mood and Affect: Mood normal.        Speech: Speech normal.        Behavior: Behavior is cooperative.    Depression Screen     No data to display         Results for orders placed or performed in visit on 06/23/23  HM MAMMOGRAPHY  Result Value Ref Range   HM Mammogram Self Reported Normal 0-4 Bi-Rad, Self Reported Normal    Assessment & Plan      Problem List Items Addressed This Visit       Respiratory   Moderate persistent asthma without complication    Asthma vs undx copd given long-standing smoking history Cont albuterol prn      Relevant Medications   albuterol (VENTOLIN HFA)  108 (90 Base) MCG/ACT inhaler     Endocrine   Hyperlipidemia associated with type 2 diabetes mellitus (HCC)    Ldl goal < 70 follow with endo and cardio Manages with pravastatin 40 mg      Relevant Medications   pravastatin (PRAVACHOL) 40 MG tablet   Type 2 diabetes mellitus with hyperglycemia, with long-term current use of insulin (HCC)    Follows with endo Ordered uacr today  Manages with lantus 25 U at bedtime, novolog 10-12 U mealtime, metformin 1000 mg BID On statin      Relevant Medications   pravastatin (PRAVACHOL) 40 MG tablet   Other Relevant Orders   Urine Microalbumin w/creat. ratio     Other   Bipolar disease, chronic (HCC)    Patient reports ongoing management with Seroquel. No changes today, -Continue current management with Seroquel 800 mg qhs. Any changes would refer to psych  Stress incontinence of urine - Primary     Discussed the use of oxybutynin as a first-line treatment and potential side effects (dry mouth and eyes). Also discussed the potential benefit of pelvic floor therapy. -Start oxybutynin 5 mg XR and monitor for side effects. -Consider referral to pelvic floor therapy after completion of lymphedema therapy. F/b 3 weeks      Relevant Medications   oxybutynin (DITROPAN-XL) 5 MG 24 hr tablet   Other Relevant Orders   Urine Microscopic Only   Mass of left side of neck    Patient reports persistent pain on the left side of her neck to her bicep. S/p cortisone injection which has helped.  Difficult to assess if I am just feeling muscle, scar tissue, or a hard mass underneath the muscle -Order ultrasound of the area to rule out underlying issues.      Relevant Orders   US Soft Tissue Head/Neck (NON-THYROID)   Morbid obesity (HCC)    BMI 34 w/ DM and HLD Patient reports weight gain and desire to lose weight. Discussed the use of phentermine, but decided to hold off due to potential for increased heart rate. -Focus on blood sugar control and  lifestyle modifications for weight loss. H/o pancreatitis w/ glp use        Return in about 3 weeks (around 07/14/2023) for stress incontinence.     I, Alfredia Ferguson, PA-C have reviewed all documentation for this visit. The documentation on  06/23/23   for the exam, diagnosis, procedures, and orders are all accurate and complete.    Alfredia Ferguson, PA-C  [facility] [phone] (phone) [phone] (fax)  [facility]

## 2023-06-23 NOTE — Assessment & Plan Note (Signed)
Follows with endo Ordered uacr today  Manages with lantus 25 U at bedtime, novolog 10-12 U mealtime, metformin 1000 mg BID On statin

## 2023-06-23 NOTE — Assessment & Plan Note (Addendum)
BMI 34 w/ DM and HLD Patient reports weight gain and desire to lose weight. Discussed the use of phentermine, but decided to hold off due to potential for increased heart rate. -Focus on blood sugar control and lifestyle modifications for weight loss. H/o pancreatitis w/ glp use

## 2023-06-23 NOTE — Assessment & Plan Note (Addendum)
Patient reports persistent pain on the left side of her neck to her bicep. S/p cortisone injection which has helped.  Difficult to assess if I am just feeling muscle, scar tissue, or a hard mass underneath the muscle -Order ultrasound of the area to rule out underlying issues.

## 2023-06-23 NOTE — Assessment & Plan Note (Signed)
Ldl goal < 70 follow with endo and cardio Manages with pravastatin 40 mg

## 2023-06-23 NOTE — Assessment & Plan Note (Signed)
Patient reports ongoing management with Seroquel. No changes today, -Continue current management with Seroquel 800 mg qhs. Any changes would refer to psych

## 2023-06-23 NOTE — Assessment & Plan Note (Signed)
Asthma vs undx copd given long-standing smoking history Cont albuterol prn

## 2023-06-23 NOTE — Assessment & Plan Note (Signed)
Discussed the use of oxybutynin as a first-line treatment and potential side effects (dry mouth and eyes). Also discussed the potential benefit of pelvic floor therapy. -Start oxybutynin 5 mg XR and monitor for side effects. -Consider referral to pelvic floor therapy after completion of lymphedema therapy. F/b 3 weeks

## 2023-06-28 ENCOUNTER — Other Ambulatory Visit (HOSPITAL_BASED_OUTPATIENT_CLINIC_OR_DEPARTMENT_OTHER): Payer: Self-pay

## 2023-06-28 ENCOUNTER — Other Ambulatory Visit: Payer: Self-pay

## 2023-06-28 ENCOUNTER — Ambulatory Visit (HOSPITAL_BASED_OUTPATIENT_CLINIC_OR_DEPARTMENT_OTHER)
Admission: RE | Admit: 2023-06-28 | Discharge: 2023-06-28 | Disposition: A | Discharge status: Home / Self Care | Payer: Medicaid Other | Source: Ambulatory Visit | Attending: Physician Assistant | Admitting: Physician Assistant

## 2023-06-28 ENCOUNTER — Other Ambulatory Visit: Payer: Self-pay | Admitting: Hematology & Oncology

## 2023-06-28 DIAGNOSIS — M25512 Pain in left shoulder: Secondary | ICD-10-CM

## 2023-06-28 DIAGNOSIS — R221 Localized swelling, mass and lump, neck: Secondary | ICD-10-CM | POA: Diagnosis present

## 2023-06-28 DIAGNOSIS — C50011 Malignant neoplasm of nipple and areola, right female breast: Secondary | ICD-10-CM

## 2023-06-28 MED ORDER — HYDROCODONE-ACETAMINOPHEN 7.5-325 MG PO TABS
1.0000 | ORAL_TABLET | Freq: Four times a day (QID) | ORAL | 0 refills | Status: DC | PRN
  Filled 2023-06-28 (×2): qty 120, 30d supply, fill #0

## 2023-06-30 ENCOUNTER — Inpatient Hospital Stay: Payer: Medicaid Other

## 2023-06-30 ENCOUNTER — Other Ambulatory Visit: Payer: Self-pay | Admitting: *Deleted

## 2023-06-30 ENCOUNTER — Inpatient Hospital Stay: Payer: Medicaid Other | Attending: Hematology & Oncology

## 2023-06-30 VITALS — BP 132/65 | HR 103 | Temp 98.2°F | Resp 18

## 2023-06-30 DIAGNOSIS — Z853 Personal history of malignant neoplasm of breast: Secondary | ICD-10-CM | POA: Diagnosis not present

## 2023-06-30 DIAGNOSIS — I890 Lymphedema, not elsewhere classified: Secondary | ICD-10-CM | POA: Insufficient documentation

## 2023-06-30 DIAGNOSIS — R221 Localized swelling, mass and lump, neck: Secondary | ICD-10-CM | POA: Diagnosis not present

## 2023-06-30 DIAGNOSIS — C50011 Malignant neoplasm of nipple and areola, right female breast: Secondary | ICD-10-CM

## 2023-06-30 LAB — CBC WITH DIFFERENTIAL (CANCER CENTER ONLY)
Abs Immature Granulocytes: 0.03 10*3/uL (ref 0.00–0.07)
Basophils Absolute: 0 10*3/uL (ref 0.0–0.1)
Basophils Relative: 0 %
Eosinophils Absolute: 0 10*3/uL (ref 0.0–0.5)
Eosinophils Relative: 1 %
HCT: 37.1 % (ref 36.0–46.0)
Hemoglobin: 12.3 g/dL (ref 12.0–15.0)
Immature Granulocytes: 0 %
Lymphocytes Relative: 21 %
Lymphs Abs: 1.7 10*3/uL (ref 0.7–4.0)
MCH: 30.5 pg (ref 26.0–34.0)
MCHC: 33.2 g/dL (ref 30.0–36.0)
MCV: 92.1 fL (ref 80.0–100.0)
Monocytes Absolute: 0.4 10*3/uL (ref 0.1–1.0)
Monocytes Relative: 5 %
Neutro Abs: 5.9 10*3/uL (ref 1.7–7.7)
Neutrophils Relative %: 73 %
Platelet Count: 296 10*3/uL (ref 150–400)
RBC: 4.03 MIL/uL (ref 3.87–5.11)
RDW: 14.3 % (ref 11.5–15.5)
WBC Count: 8.1 10*3/uL (ref 4.0–10.5)
nRBC: 0 % (ref 0.0–0.2)

## 2023-06-30 LAB — CMP (CANCER CENTER ONLY)
ALT: 30 U/L (ref 0–44)
AST: 26 U/L (ref 15–41)
Albumin: 4.1 g/dL (ref 3.5–5.0)
Alkaline Phosphatase: 70 U/L (ref 38–126)
Anion gap: 9 (ref 5–15)
BUN: 22 mg/dL — ABNORMAL HIGH (ref 6–20)
CO2: 26 mmol/L (ref 22–32)
Calcium: 9.5 mg/dL (ref 8.9–10.3)
Chloride: 98 mmol/L (ref 98–111)
Creatinine: 0.88 mg/dL (ref 0.44–1.00)
GFR, Estimated: >60 mL/min (ref >60)
Glucose, Bld: 277 mg/dL — ABNORMAL HIGH (ref 70–99)
Potassium: 4.1 mmol/L (ref 3.5–5.1)
Sodium: 133 mmol/L — ABNORMAL LOW (ref 135–145)
Total Bilirubin: 0.4 mg/dL (ref 0.3–1.2)
Total Protein: 6.8 g/dL (ref 6.5–8.1)

## 2023-06-30 MED ORDER — HEPARIN SOD (PORK) LOCK FLUSH 100 UNIT/ML IV SOLN
500.0000 [IU] | Freq: Once | INTRAVENOUS | Status: AC
  Administered 2023-06-30: 500 [IU] via INTRAVENOUS

## 2023-06-30 MED ORDER — SODIUM CHLORIDE 0.9% FLUSH
10.0000 mL | Freq: Once | INTRAVENOUS | Status: AC
  Administered 2023-06-30: 10 mL

## 2023-07-06 NOTE — Therapy (Signed)
OUTPATIENT PHYSICAL THERAPY  UPPER EXTREMITY ONCOLOGY EVALUATION  Patient Name: Ariana Herrera MRN: [id number] DOB:November 14, 1965, 57 y.o., female Today's Date: 07/07/2023  END OF SESSION:  PT End of Session - 07/07/23 1205     Visit Number 1    Number of Visits 12    Date for PT Re-Evaluation 08/18/23    PT Start Time 1205    PT Stop Time 1250    PT Time Calculation (min) 45 min    Activity Tolerance Patient tolerated treatment well    Behavior During Therapy WFL for tasks assessed/performed             Past Medical History:  Diagnosis Date   Anginal pain (HCC)    Anxiety    Arthritis    Asthma    Bipolar 1 disorder (HCC)    Bipolar disease, chronic (HCC)    Breast cancer in female (HCC)    2019 and recurrent in 2023 to right breast, right axillary, and left neck   Coronary artery disease    Deep vein blood clot of right lower extremity (HCC)    Diabetes mellitus type 2 in obese    Goals of care, counseling/discussion 11/23/2021   Hyperlipidemia    Lymphedema    MI (myocardial infarction) (HCC)    was in [place]   Obesity    Pancreatitis    Personal history of chemotherapy    Personal history of radiation therapy    Sleep apnea    Past Surgical History:  Procedure Laterality Date   BREAST LUMPECTOMY Left 03/2019   CATARACT EXTRACTION Bilateral    CHOLECYSTECTOMY     IR IMAGING GUIDED PORT INSERTION  12/15/2021   IR REMOVAL TUN ACCESS W/ PORT W/O FL MOD SED  12/20/2019   IR US GUIDE BX ASP/DRAIN  11/02/2021   LEFT HEART CATH AND CORONARY ANGIOGRAPHY N/A 08/22/2020   Procedure: LEFT HEART CATH AND CORONARY ANGIOGRAPHY;  Surgeon: Kathleene Hazel, MD;  Location: [facility];  Service: Cardiovascular;  Laterality: N/A;   TRIGGER FINGER RELEASE Bilateral    x 5   TUBAL LIGATION     UMBILICAL HERNIA REPAIR     x 2   uterine ablation     Patient Active Problem List   Diagnosis Date Noted   Type 2 diabetes mellitus with hyperglycemia, with  long-term current use of insulin (HCC) 06/23/2023   Stress incontinence of urine 06/23/2023   Moderate persistent asthma without complication 06/23/2023   Mass of left side of neck 06/23/2023   Morbid obesity (HCC) 06/23/2023   Chest pain of uncertain etiology    History of MI (myocardial infarction) 08/20/2020   History of pancreatitis 08/20/2020   Hyperlipidemia associated with type 2 diabetes mellitus (HCC) 08/20/2020   Bipolar disease, chronic (HCC)    Breast cancer (HCC) 11/26/2019   Chemotherapy-induced neuropathy (HCC) 11/26/2019   History of DVT (deep vein thrombosis) 11/26/2019      REFERRING PROVIDER: Kennon Holter MD  REFERRING DIAG: Breast Lymphedema s/p left Breast Cancer  THERAPY DIAG:  Malignant neoplasm of left female breast, unspecified estrogen receptor status, unspecified site of breast (HCC)  Lymphedema, not elsewhere classified  ONSET DATE: 2 months ago  Rationale for Evaluation and Treatment: Rehabilitation  SUBJECTIVE:  SUBJECTIVE STATEMENT:   Pt has lymphedema in the Left breast and she had therapy for it. Her breast started getting larger and larger since November 2023. It was hot and painful 2 months ago and she was given antibiotics.  Her compression bra does not fit anymore and she has not tried the MLD because it had stopped working, She has pain and itching in the breast. She has a right breast MRI for a concerning spot on Oct 4. She has a swollen left cervical LN.She is also having some left shoulder pain for which she had an injection on 06/14/2023. We are addressing the breast only. She has not not had any chemo since Dec 2023.  PERTINENT HISTORY:  DM, bipolar disorder, high cholesterol, asthma, history of blood clots, 3 heart blockages, breast cancer treatment  completed lumpectomy 07/17/18 on the left with SLNB 0/1 nodes removed. Chemo completed except for 3 sessions and radiation completed all in [place].  Now Stage 4 Breast Cancer .Significant radiation fibrosis. Significant left shoulder pain after attempting MRI. Injected 06/14/23? Had prior treatment for Left breast lymphedema. Havi  PAIN:  Are you having pain? Yes NPRS scale: 7/10 Pain location: left breast Pain orientation: Other: entire left breast   PAIN TYPE: burning, throbbing, and itchy Pain description: constant  Aggravating factors: activity Relieving factors: nothing  PRECAUTIONS: DM, bipolar disorder, high cholesterol, asthma, history of blood clots, 3 heart blockages,  RED FLAGS: Bowel or bladder incontinence: Yes:      WEIGHT BEARING RESTRICTIONS: No  FALLS:  Has patient fallen in last 6 months? Yes. Number of falls 1  LIVING ENVIRONMENT: Lives with: lives with their spouse Lives in: Other RV  OCCUPATION: Not working  LEISURE: gardening, walking, but not presently  HAND DOMINANCE: right   PRIOR LEVEL OF FUNCTION: Independent  PATIENT GOALS: Decrease pain and swelling in left breast   OBJECTIVE:  COGNITION: Overall cognitive status: Within functional limits for tasks assessed   PALPATION: Very firm, tender  OBSERVATIONS / OTHER ASSESSMENTS: Left Breast extremely drawn up, red and firm. Much smaller than right breast. MD notes severe radiation fibrosis. Had prior infection 2 months ago now resolved .  SENSATION: Light touch: Deficits    POSTURE: Forward head, rounded shoulders  UPPER EXTREMITY AROM/PROM:  A/PROM RIGHT   eval   Shoulder extension   Shoulder flexion   Shoulder abduction   Shoulder internal rotation   Shoulder external rotation     (Blank rows = not tested)  A/PROM LEFT   eval  Shoulder extension Pain in left shoulder;recent injection;does not want to treat  Shoulder flexion   Shoulder abduction   Shoulder internal rotation    Shoulder external rotation     (Blank rows = not tested)  CERVICAL AROM: All within normal limits:      UPPER EXTREMITY STRENGTH:   LYMPHEDEMA ASSESSMENTS:   SURGERY TYPE/DATE: Left Lumpectomy with SLNB 07/17/18  NUMBER OF LYMPH NODES REMOVED: 0/1  CHEMOTHERAPY: YES  RADIATION:YES  HORMONE TREATMENT: NO  INFECTIONS: YES, 2 months ago   LYMPHEDEMA ASSESSMENTS:   LANDMARK RIGHT  eval  At axilla    15 cm proximal to olecranon process   10 cm proximal to olecranon process 29.7  Olecranon process 25.1  15 cm proximal to ulnar styloid process   10 cm proximal to ulnar styloid process 22.7  Just proximal to ulnar styloid process 16.8  Across hand at thumb web space 19.3  At base of 2nd digit 6.3  (Blank rows =  not tested)  LANDMARK LEFT  eval  At axilla    15 cm proximal to olecranon process   10 cm proximal to olecranon process 30  Olecranon process 26  15 cm proximal to ulnar styloid process   10 cm proximal to ulnar styloid process 21.8  Just proximal to ulnar styloid process 16.4  Across hand at thumb web space 19.0  At base of 2nd digit 6.2  (Blank rows = not tested)   FUNCTIONAL TESTS:    GAIT: WNL    QUICK DASH SURVEY: 36%  BREAST COMPLAINTS QUESTIONNAIRE Pain:7 Heaviness:7 Swollen feeling:7 Tense Skin:9 Redness:10 Bra Print:7 Size of Pores:10 Hard feeling: 10 Total:    63 /80 A Score over 9 indicates lymphedema issues in the breast   TODAY'S TREATMENT:                                                                                                                                          DATE:  Gave pt name of [facility] to see if her insurance would cover a new bra and if they had any suggestions to try and get compression on the left breast. She was going to call or go by there today    PATIENT EDUCATION:  Education details: POC, goals for rx, treatment Person educated: Patient Education method: Explanation Education  comprehension: verbalized understanding  HOME EXERCISE PROGRAM: None given  ASSESSMENT:  CLINICAL IMPRESSION: Patient is a 57 y.o. female who was seen today for physical therapy evaluation and treatment for Left breast lymphedema.  She is s/p chemo, left lumpectomy and radiation in [place] in 2019.  She had development of Cancer in supraclavicular nodes, axilla and other areas rated Stage IV and which chemotherapy seemed to eradicate. She is not presently taking any chemotherapy.She had prior therapy here in 2021 for breast lymphedema. Over the last few months the left breast had an infection for which she had antibiotics. The infection cleared up, but around that time the breast drew up significantly and became very red, firm and tender to the point it appears 30- 50% smaller than the other breast. The skin is very taut and painful. MD notes severe radiation fibrosis. Patient wanted to have the breast removed, but MD was afraid that would activate the cancer again. She is also scheduled to have a right breast MRI in early October due to some possible findings in that breast. She will benefit from skilled PT to address deficits of pain, swelling, fibrosis.   OBJECTIVE IMPAIRMENTS: decreased activity tolerance, decreased knowledge of condition, decreased mobility, decreased ROM, increased edema, impaired sensation, postural dysfunction, and pain.   ACTIVITY LIMITATIONS: lifting, sleeping, and reach over head  PARTICIPATION LIMITATIONS:  able to participate in some activities but with pain  PERSONAL FACTORS: 3+ comorbidities: stage IV breast cancer s/p chemo, radiation,DM,Heart problems  are also affecting patient's functional outcome.  REHAB POTENTIAL: Good  CLINICAL DECISION MAKING: Stable/uncomplicated  EVALUATION COMPLEXITY: Low  GOALS: Goals reviewed with patient? Yes  SHORT TERM GOALS= Long TERM GOALS: Target date: 08/18/2023  Pt will be independent with left breast MLD to  decrease swelling Baseline: Goal status: INITIAL  2.  Pt will be tolerant of left breast compression to decrease edema Baseline:  Goal status: INITIAL  3.  Pt will have decreased left breast pain by 50% or greater Baseline:  Goal status: INITIAL  4.  Breast complaints survey will decrease to 30 or less to demonstrate improvements in left breast swelling/pain Baseline:  Goal status: INITIAL  PLAN:  PT FREQUENCY: 2x/week  PT DURATION: 6 weeks  PLANNED INTERVENTIONS: Therapeutic exercises, Therapeutic activity, Patient/Family education, Self Care, Orthotic/Fit training, Manual lymph drainage, Compression bandaging, Vasopneumatic device, Manual therapy, and Re-evaluation  PLAN FOR NEXT SESSION: perform MLD and instruct pt, did she go to [facility] to see if they could suggest any compression?, chip pack or short stretch bandage?  Waynette Buttery, PT 07/07/2023, 12:55 PM

## 2023-07-07 ENCOUNTER — Ambulatory Visit: Payer: Medicaid Other | Attending: Surgery

## 2023-07-07 ENCOUNTER — Other Ambulatory Visit: Payer: Self-pay

## 2023-07-07 DIAGNOSIS — C50912 Malignant neoplasm of unspecified site of left female breast: Secondary | ICD-10-CM

## 2023-07-07 DIAGNOSIS — C50011 Malignant neoplasm of nipple and areola, right female breast: Secondary | ICD-10-CM | POA: Diagnosis present

## 2023-07-07 DIAGNOSIS — M25512 Pain in left shoulder: Secondary | ICD-10-CM

## 2023-07-07 DIAGNOSIS — I890 Lymphedema, not elsewhere classified: Secondary | ICD-10-CM

## 2023-07-07 DIAGNOSIS — N644 Mastodynia: Secondary | ICD-10-CM

## 2023-07-13 NOTE — Progress Notes (Unsigned)
Established patient visit   Patient: Ariana Herrera   DOB: 1966-06-03   57 y.o. Female  MRN: [id number] Visit Date: 07/14/2023  Today's healthcare provider: Alfredia Ferguson, PA-C   No chief complaint on file.  Subjective    HPI   Follow up for overactive bladder/stress incontinence.    Medications: Outpatient Medications Prior to Visit  Medication Sig   ACCU-CHEK GUIDE test strip 3 (three) times daily.   albuterol (ACCUNEB) 1.25 MG/3ML nebulizer solution Take 1 ampule by nebulization 4 (four) times daily as needed for wheezing or shortness of breath.   albuterol (VENTOLIN HFA) 108 (90 Base) MCG/ACT inhaler Inhale 2 puffs into the lungs every 6 (six) hours as needed for wheezing or shortness of breath.   Continuous Glucose Receiver (FREESTYLE LIBRE 3 READER) DEVI Inject 1 kit into the skin every 14 (fourteen) days.   Continuous Glucose Sensor (FREESTYLE LIBRE 3 SENSOR) MISC 2 KITS BY MISCELLANEOUS ROUTE EVERY 14 (FOURTEEN) DAYS.   diclofenac (VOLTAREN) 75 MG EC tablet Take 1 tablet (75 mg total) by mouth 2 (two) times daily.   fenofibrate (TRICOR) 145 MG tablet Take 145 mg by mouth daily.   HYDROcodone-acetaminophen (NORCO) 7.5-325 MG tablet Take 1 tablet by mouth every 6 (six) hours as needed for moderate pain.   ibuprofen (ADVIL) 800 MG tablet Take 1 tablet (800 mg total) by mouth 3 (three) times daily.   insulin aspart (NOVOLOG FLEXPEN) 100 UNIT/ML FlexPen Inject 3 Units into the skin 3 (three) times daily with meals. (Patient taking differently: Inject 10-16 Units into the skin 3 (three) times daily with meals.)   insulin degludec (TRESIBA FLEXTOUCH) 100 UNIT/ML FlexTouch Pen Inject 25 Units into the skin daily.   insulin glargine (LANTUS) 100 UNIT/ML injection Inject 25 Units into the skin at bedtime.   lidocaine (LIDODERM) 5 % Place 1 patch onto the skin daily. Remove & Discard patch within 12 hours or as directed by MD   linaclotide (LINZESS) 290 MCG CAPS capsule Take  1 capsule (290 mcg total) by mouth daily before breakfast.   metFORMIN (GLUCOPHAGE) 1000 MG tablet Take 1,000 mg by mouth 2 (two) times daily with a meal.   nitroGLYCERIN (NITROSTAT) 0.4 MG SL tablet Place 1 tablet (0.4 mg total) under the tongue every 5 (five) minutes as needed for chest pain.   ondansetron (ZOFRAN) 8 MG tablet TAKE 1 TAB BY MOUTH EVERY 8 HOURS AS NEEDED FOR NAUSEA OR VOMITING. START ON THE THIRD DAY AFTER CHEMOTHERAPY.   oxybutynin (DITROPAN-XL) 5 MG 24 hr tablet Take 1 tablet (5 mg total) by mouth at bedtime.   pravastatin (PRAVACHOL) 40 MG tablet Take 1 tablet (40 mg total) by mouth daily.   QUEtiapine (SEROQUEL) 400 MG tablet Take 800 mg by mouth at bedtime.   No facility-administered medications prior to visit.    Review of Systems {Insert previous labs (optional):23779} {See past labs  Heme  Chem  Endocrine  Serology  Results Review (optional):1}   Objective    LMP 10/04/2016 Comment: after "procedure to stop bleeding" {Insert last BP/Wt (optional):23777}{See vitals history (optional):1}  Physical Exam  ***  No results found for any visits on 07/14/23.  Assessment & Plan     ***  No follow-ups on file.      {provider attestation***:1}   Alfredia Ferguson, PA-C  [facility] [phone] (phone) [phone] (fax)  [facility]

## 2023-07-14 ENCOUNTER — Encounter: Payer: Self-pay | Admitting: Physician Assistant

## 2023-07-14 ENCOUNTER — Ambulatory Visit (INDEPENDENT_AMBULATORY_CARE_PROVIDER_SITE_OTHER): Payer: Medicaid Other | Admitting: Physician Assistant

## 2023-07-14 VITALS — BP 130/76 | HR 110 | Temp 98.2°F | Ht 62.0 in | Wt 187.0 lb

## 2023-07-14 DIAGNOSIS — G8929 Other chronic pain: Secondary | ICD-10-CM

## 2023-07-14 DIAGNOSIS — Z720 Tobacco use: Secondary | ICD-10-CM | POA: Insufficient documentation

## 2023-07-14 DIAGNOSIS — N393 Stress incontinence (female) (male): Secondary | ICD-10-CM | POA: Diagnosis not present

## 2023-07-14 DIAGNOSIS — E1165 Type 2 diabetes mellitus with hyperglycemia: Secondary | ICD-10-CM

## 2023-07-14 DIAGNOSIS — M545 Low back pain, unspecified: Secondary | ICD-10-CM

## 2023-07-14 DIAGNOSIS — Z794 Long term (current) use of insulin: Secondary | ICD-10-CM

## 2023-07-14 DIAGNOSIS — E669 Obesity, unspecified: Secondary | ICD-10-CM

## 2023-07-14 MED ORDER — FREESTYLE LIBRE 2 SENSOR MISC: 3 refills | Status: DC

## 2023-07-14 MED ORDER — CYCLOBENZAPRINE HCL 5 MG PO TABS: 5.0000 mg | ORAL_TABLET | Freq: Two times a day (BID) | ORAL | 0 refills | Status: DC | PRN

## 2023-07-14 MED ORDER — FREESTYLE LIBRE 2 READER DEVI: 0 refills | Status: DC

## 2023-07-14 NOTE — Assessment & Plan Note (Signed)
Discussed smoking cessation, pt does express a desire but no action to stop

## 2023-07-14 NOTE — Assessment & Plan Note (Signed)
Expressed desire for weight loss medication, but due to elevated heart rate and previous adverse reactions, medication not deemed appropriate at this time. Discussed the role of diet and increased physical activity in weight loss. -tachycardia present from smoking; advised phentermine would only increase this -history of allergic reaction vs pancreatitis from GLP 1 agents. -Encourage continuation of physical therapy and increased physical activity. -Discuss dietary changes for weight loss.

## 2023-07-14 NOTE — Assessment & Plan Note (Signed)
Noted improvement with medication, but still experiencing leakage with coughing. Discussed the role of pelvic floor strengthening and weight loss in managing symptoms. -Continue current medication. -Provide resources for at-home pelvic floor exercises. -Discuss potential for pelvic floor therapy after completion of current physical therapy.

## 2023-07-20 ENCOUNTER — Ambulatory Visit: Payer: Medicaid Other

## 2023-07-20 DIAGNOSIS — N644 Mastodynia: Secondary | ICD-10-CM

## 2023-07-20 DIAGNOSIS — C50912 Malignant neoplasm of unspecified site of left female breast: Secondary | ICD-10-CM

## 2023-07-20 DIAGNOSIS — C50011 Malignant neoplasm of nipple and areola, right female breast: Secondary | ICD-10-CM | POA: Diagnosis not present

## 2023-07-20 DIAGNOSIS — M25512 Pain in left shoulder: Secondary | ICD-10-CM

## 2023-07-20 DIAGNOSIS — I890 Lymphedema, not elsewhere classified: Secondary | ICD-10-CM

## 2023-07-20 NOTE — Therapy (Signed)
OUTPATIENT PHYSICAL THERAPY  UPPER EXTREMITY ONCOLOGY TREATMENT  Patient Name: Ariana Herrera MRN: [id number] DOB:December 10, 1965, 57 y.o., female Today's Date: 07/20/2023  END OF SESSION:  PT End of Session - 07/20/23 1001     Visit Number 2    Number of Visits 12    Date for PT Re-Evaluation 08/18/23    PT Start Time 1005    PT Stop Time 1054    PT Time Calculation (min) 49 min    Activity Tolerance Patient tolerated treatment well    Behavior During Therapy WFL for tasks assessed/performed             Past Medical History:  Diagnosis Date   Anginal pain (HCC)    Anxiety    Arthritis    Asthma    Bipolar 1 disorder (HCC)    Bipolar disease, chronic (HCC)    Breast cancer in female (HCC)    2019 and recurrent in 2023 to right breast, right axillary, and left neck   Coronary artery disease    Deep vein blood clot of right lower extremity (HCC)    Diabetes mellitus type 2 in obese    Goals of care, counseling/discussion 11/23/2021   Hyperlipidemia    Lymphedema    MI (myocardial infarction) (HCC)    was in [place]   Obesity    Pancreatitis    Personal history of chemotherapy    Personal history of radiation therapy    Sleep apnea    Past Surgical History:  Procedure Laterality Date   BREAST LUMPECTOMY Left 03/2019   CATARACT EXTRACTION Bilateral    CHOLECYSTECTOMY     IR IMAGING GUIDED PORT INSERTION  12/15/2021   IR REMOVAL TUN ACCESS W/ PORT W/O FL MOD SED  12/20/2019   IR US GUIDE BX ASP/DRAIN  11/02/2021   LEFT HEART CATH AND CORONARY ANGIOGRAPHY N/A 08/22/2020   Procedure: LEFT HEART CATH AND CORONARY ANGIOGRAPHY;  Surgeon: Kathleene Hazel, MD;  Location: [facility];  Service: Cardiovascular;  Laterality: N/A;   TRIGGER FINGER RELEASE Bilateral    x 5   TUBAL LIGATION     UMBILICAL HERNIA REPAIR     x 2   uterine ablation     Patient Active Problem List   Diagnosis Date Noted   Tobacco use 07/14/2023   Type 2 diabetes mellitus  with hyperglycemia, with long-term current use of insulin (HCC) 06/23/2023   Stress incontinence of urine 06/23/2023   Moderate persistent asthma without complication 06/23/2023   Obesity (BMI 30-39.9) 06/23/2023   Chest pain of uncertain etiology    History of MI (myocardial infarction) 08/20/2020   History of pancreatitis 08/20/2020   Hyperlipidemia associated with type 2 diabetes mellitus (HCC) 08/20/2020   Bipolar disease, chronic (HCC)    Breast cancer (HCC) 11/26/2019   Chemotherapy-induced neuropathy (HCC) 11/26/2019   History of DVT (deep vein thrombosis) 11/26/2019      REFERRING PROVIDER: Kennon Holter MD  REFERRING DIAG: Breast Lymphedema s/p left Breast Cancer  THERAPY DIAG:  Malignant neoplasm of left female breast, unspecified estrogen receptor status, unspecified site of breast (HCC)  Lymphedema, not elsewhere classified  Acute pain of left shoulder  Breast pain, left  ONSET DATE: 2 months ago  Rationale for Evaluation and Treatment: Rehabilitation  SUBJECTIVE:  SUBJECTIVE STATEMENT:  I tried my compression bra that I had at home and I have been in it for a week. Its a little softer on the outside. I also tried the back brace a few days when I am at home. I have intermittent sharp pains in the nipple area a few times per day.  Eval Pt has lymphedema in the Left breast and she had therapy for it. Her breast started getting larger and larger since November 2023. It was hot and painful 2 months ago and she was given antibiotics.  Her compression bra does not fit anymore and she has not tried the MLD because it had stopped working, She has pain and itching in the breast. She has a right breast MRI for a concerning spot on Oct 4. She has a swollen left cervical LN.She is also  having some left shoulder pain for which she had an injection on 06/14/2023. We are addressing the breast only. She has not not had any chemo since Dec 2023.  PERTINENT HISTORY:  DM, bipolar disorder, high cholesterol, asthma, history of blood clots, 3 heart blockages, breast cancer treatment completed lumpectomy 07/17/18 on the left with SLNB 0/1 nodes removed. Chemo completed except for 3 sessions and radiation completed all in [place].  Now Stage 4 Breast Cancer .Significant radiation fibrosis. Significant left shoulder pain after attempting MRI. Injected 06/14/23? Had prior treatment for Left breast lymphedema. Havi  PAIN:  Are you having pain? Yes NPRS scale: 5/10 Pain location: left breast Pain orientation: Other: entire left breast   PAIN TYPE: burning, throbbing, and itchy Pain description: constant  Aggravating factors: activity Relieving factors: nothing  PRECAUTIONS: DM, bipolar disorder, high cholesterol, asthma, history of blood clots, 3 heart blockages,  RED FLAGS: Bowel or bladder incontinence: Yes:      WEIGHT BEARING RESTRICTIONS: No  FALLS:  Has patient fallen in last 6 months? Yes. Number of falls 1  LIVING ENVIRONMENT: Lives with: lives with their spouse Lives in: Other RV  OCCUPATION: Not working  LEISURE: gardening, walking, but not presently  HAND DOMINANCE: right   PRIOR LEVEL OF FUNCTION: Independent  PATIENT GOALS: Decrease pain and swelling in left breast   OBJECTIVE:  COGNITION: Overall cognitive status: Within functional limits for tasks assessed   PALPATION: Very firm, tender  OBSERVATIONS / OTHER ASSESSMENTS: Left Breast extremely drawn up, red and firm. Much smaller than right breast. MD notes severe radiation fibrosis. Had prior infection 2 months ago now resolved .  SENSATION: Light touch: Deficits    POSTURE: Forward head, rounded shoulders  UPPER EXTREMITY AROM/PROM:  A/PROM RIGHT   eval   Shoulder extension   Shoulder  flexion   Shoulder abduction   Shoulder internal rotation   Shoulder external rotation     (Blank rows = not tested)  A/PROM LEFT   eval  Shoulder extension Pain in left shoulder;recent injection;does not want to treat  Shoulder flexion   Shoulder abduction   Shoulder internal rotation   Shoulder external rotation     (Blank rows = not tested)  CERVICAL AROM: All within normal limits:      UPPER EXTREMITY STRENGTH:   LYMPHEDEMA ASSESSMENTS:   SURGERY TYPE/DATE: Left Lumpectomy with SLNB 07/17/18  NUMBER OF LYMPH NODES REMOVED: 0/1  CHEMOTHERAPY: YES  RADIATION:YES  HORMONE TREATMENT: NO  INFECTIONS: YES, 2 months ago   LYMPHEDEMA ASSESSMENTS:   LANDMARK RIGHT  eval  At axilla    15 cm proximal to olecranon process  10 cm proximal to olecranon process 29.7  Olecranon process 25.1  15 cm proximal to ulnar styloid process   10 cm proximal to ulnar styloid process 22.7  Just proximal to ulnar styloid process 16.8  Across hand at thumb web space 19.3  At base of 2nd digit 6.3  (Blank rows = not tested)  LANDMARK LEFT  eval  At axilla    15 cm proximal to olecranon process   10 cm proximal to olecranon process 30  Olecranon process 26  15 cm proximal to ulnar styloid process   10 cm proximal to ulnar styloid process 21.8  Just proximal to ulnar styloid process 16.4  Across hand at thumb web space 19.0  At base of 2nd digit 6.2  (Blank rows = not tested)   FUNCTIONAL TESTS:    GAIT: WNL    QUICK DASH SURVEY: 36%  BREAST COMPLAINTS QUESTIONNAIRE Pain:7 Heaviness:7 Swollen feeling:7 Tense Skin:9 Redness:10 Bra Print:7 Size of Pores:10 Hard feeling: 10 Total:    63 /80 A Score over 9 indicates lymphedema issues in the breast   TODAY'S TREATMENT:                                                                                                                                          DATE:  07/20/2023 Spaghetti foam pad (white) made for  pts bra to try and soften breast. Pt thought she had a compression bra, but what she is wearing is actually very little compression. She will check at home for another bra. Initiated MLD and massage techniques to left breast to decrease firmness and swelling. Educated pt in gentle nature of technique, LN's, pathways and general information about MLD. Therapist performed MLD to the left breast as follows;In supine: Short neck, 5 diaphragmatic breaths, R axillary nodes and establishment of interaxillary pathway, L inguinal nodes and establishment of axilloinguinal pathway, then L breast moving fluid towards pathways spending extra time in any areas of fibrosis in entire breast then retracing all steps and ending with LN's Helped pt get foam bra in her bra    EVAL Gave pt name of [facility] to see if her insurance would cover a new bra and if they had any suggestions to try and get compression on the left breast. She was going to call or go by there today    PATIENT EDUCATION:  Education details: POC, goals for rx, treatment Person educated: Patient Education method: Explanation Education comprehension: verbalized understanding  HOME EXERCISE PROGRAM: None given  ASSESSMENT:  CLINICAL IMPRESSION:  Initiated MLD and fibrosis techniques to left breast and started educating pt. Breast did seem to soften a little, but is still very firm. Pt to try and find a more compressive bra at home, and will start some massage techniques to her breast. Will start pt practicing sequence next visit.  OBJECTIVE IMPAIRMENTS: decreased activity tolerance, decreased  knowledge of condition, decreased mobility, decreased ROM, increased edema, impaired sensation, postural dysfunction, and pain.   ACTIVITY LIMITATIONS: lifting, sleeping, and reach over head  PARTICIPATION LIMITATIONS:  able to participate in some activities but with pain  PERSONAL FACTORS: 3+ comorbidities: stage IV breast cancer s/p chemo,  radiation,DM,Heart problems  are also affecting patient's functional outcome.   REHAB POTENTIAL: Good  CLINICAL DECISION MAKING: Stable/uncomplicated  EVALUATION COMPLEXITY: Low  GOALS: Goals reviewed with patient? Yes  SHORT TERM GOALS= Long TERM GOALS: Target date: 08/18/2023  Pt will be independent with left breast MLD to decrease swelling Baseline: Goal status: INITIAL  2.  Pt will be tolerant of left breast compression to decrease edema Baseline:  Goal status: INITIAL  3.  Pt will have decreased left breast pain by 50% or greater Baseline:  Goal status: INITIAL  4.  Breast complaints survey will decrease to 30 or less to demonstrate improvements in left breast swelling/pain Baseline:  Goal status: INITIAL  PLAN:  PT FREQUENCY: 2x/week  PT DURATION: 6 weeks  PLANNED INTERVENTIONS: Therapeutic exercises, Therapeutic activity, Patient/Family education, Self Care, Orthotic/Fit training, Manual lymph drainage, Compression bandaging, Vasopneumatic device, Manual therapy, and Re-evaluation  PLAN FOR NEXT SESSION: perform MLD and instruct pt, did she go to [facility] to see if they could suggest any compression?, chip pack or short stretch bandage?  Waynette Buttery, PT 07/20/2023, 10:55 AM

## 2023-07-21 ENCOUNTER — Ambulatory Visit: Payer: Medicaid Other | Admitting: Medical Oncology

## 2023-07-21 ENCOUNTER — Other Ambulatory Visit: Payer: Medicaid Other

## 2023-07-21 ENCOUNTER — Other Ambulatory Visit: Payer: Self-pay | Admitting: *Deleted

## 2023-07-21 DIAGNOSIS — C50019 Malignant neoplasm of nipple and areola, unspecified female breast: Secondary | ICD-10-CM

## 2023-07-22 ENCOUNTER — Inpatient Hospital Stay: Payer: Medicaid Other

## 2023-07-22 ENCOUNTER — Encounter: Payer: Self-pay | Admitting: Oncology

## 2023-07-22 ENCOUNTER — Inpatient Hospital Stay (HOSPITAL_BASED_OUTPATIENT_CLINIC_OR_DEPARTMENT_OTHER): Payer: Medicaid Other | Admitting: Oncology

## 2023-07-22 VITALS — BP 149/78 | HR 118 | Temp 98.3°F | Resp 18 | Wt 189.0 lb

## 2023-07-22 DIAGNOSIS — C50019 Malignant neoplasm of nipple and areola, unspecified female breast: Secondary | ICD-10-CM

## 2023-07-22 DIAGNOSIS — C50011 Malignant neoplasm of nipple and areola, right female breast: Secondary | ICD-10-CM

## 2023-07-22 DIAGNOSIS — Z95828 Presence of other vascular implants and grafts: Secondary | ICD-10-CM

## 2023-07-22 DIAGNOSIS — Z170 Estrogen receptor positive status [ER+]: Secondary | ICD-10-CM

## 2023-07-22 DIAGNOSIS — I890 Lymphedema, not elsewhere classified: Secondary | ICD-10-CM | POA: Diagnosis not present

## 2023-07-22 LAB — CBC WITH DIFFERENTIAL (CANCER CENTER ONLY)
Abs Immature Granulocytes: 0.05 10*3/uL (ref 0.00–0.07)
Basophils Absolute: 0 10*3/uL (ref 0.0–0.1)
Basophils Relative: 1 %
Eosinophils Absolute: 0 10*3/uL (ref 0.0–0.5)
Eosinophils Relative: 0 %
HCT: 39.2 % (ref 36.0–46.0)
Hemoglobin: 13.2 g/dL (ref 12.0–15.0)
Immature Granulocytes: 1 %
Lymphocytes Relative: 25 %
Lymphs Abs: 1.9 10*3/uL (ref 0.7–4.0)
MCH: 31.1 pg (ref 26.0–34.0)
MCHC: 33.7 g/dL (ref 30.0–36.0)
MCV: 92.2 fL (ref 80.0–100.0)
Monocytes Absolute: 0.5 10*3/uL (ref 0.1–1.0)
Monocytes Relative: 7 %
Neutro Abs: 5.1 10*3/uL (ref 1.7–7.7)
Neutrophils Relative %: 66 %
Platelet Count: 337 10*3/uL (ref 150–400)
RBC: 4.25 MIL/uL (ref 3.87–5.11)
RDW: 13.7 % (ref 11.5–15.5)
WBC Count: 7.6 10*3/uL (ref 4.0–10.5)
nRBC: 0.4 % — ABNORMAL HIGH (ref 0.0–0.2)

## 2023-07-22 LAB — CMP (CANCER CENTER ONLY)
ALT: 16 U/L (ref 0–44)
AST: 24 U/L (ref 15–41)
Albumin: 3.9 g/dL (ref 3.5–5.0)
Alkaline Phosphatase: 64 U/L (ref 38–126)
Anion gap: 9 (ref 5–15)
BUN: 12 mg/dL (ref 6–20)
CO2: 22 mmol/L (ref 22–32)
Calcium: 9.2 mg/dL (ref 8.9–10.3)
Chloride: 104 mmol/L (ref 98–111)
Creatinine: 0.9 mg/dL (ref 0.44–1.00)
GFR, Estimated: >60 mL/min (ref >60)
Glucose, Bld: 226 mg/dL — ABNORMAL HIGH (ref 70–99)
Potassium: 4.8 mmol/L (ref 3.5–5.1)
Sodium: 135 mmol/L (ref 135–145)
Total Bilirubin: 0.3 mg/dL (ref 0.3–1.2)
Total Protein: 7.4 g/dL (ref 6.5–8.1)

## 2023-07-22 NOTE — Patient Instructions (Signed)

## 2023-07-22 NOTE — Progress Notes (Signed)
[facility] [phone] [phone]  Patient Care Team: Alfredia Ferguson, Cordelia Poche as PCP - General (Physician Assistant) Chrystie Nose, MD as PCP - Cardiology (Cardiology) [name], MD as Medical Oncologist (Oncology)   Name of the patient: Ariana Herrera  [id number]  1965/12/01   Oncological History: Metastatic Triple negative recurrent Left breast cancer with lymph node involvement currently with no active disease.   Current Treatment: Observation- patient requested  Previous Treatment:  Carbo/Gemzar/Pembrolizumab -- s/p cycle 6-- start on 11/27/2021 --DC on 06/10/2022 due to none tolerance [name] -- s/p cycle #4 - start on 06/23/2022 -- omitting day #8 -- started on 09/08/2022 --DC on 10/28/2022 --patient request  Date of visit: 07/22/23  Reason for Consult: Ariana Herrera is a 57 y.o. female turns for scheduled follow-up today.  She reports that she has noticed a mass in the left side of her neck.  It has been there about 1.5 weeks but does not seem to be getting any larger at this point.  She did have an ultrasound of the neck performed which did not show any abnormality.  However, the patient is concerned that ultrasound did not do the area of concern.  She has a good appetite and no recent weight loss.  She is not having any chest pain, shortness of breath, abdominal pain, nausea, vomiting.  Still has swelling and tightness in her left breast.  She is being seen by the lymphedema clinic.  Continues to have neuropathy which is controlled with hydrocodone.  No bleeding reported.  Has mention some difficulty concentrating and remembering things.  No other neurological changes reported.  The patient has been seen by breast surgery and is due to have an MRI of the breasts in the near future.  Wt Readings from Last 3 Encounters:  07/22/23 85.7 kg  07/14/23 84.8 kg  06/23/23 84.7 kg     PAST  MEDICAL HISTORY: Past Medical History:  Diagnosis Date   Anginal pain (HCC)    Anxiety    Arthritis    Asthma    Bipolar 1 disorder (HCC)    Bipolar disease, chronic (HCC)    Breast cancer in female (HCC)    2019 and recurrent in 2023 to right breast, right axillary, and left neck   Coronary artery disease    Deep vein blood clot of right lower extremity (HCC)    Diabetes mellitus type 2 in obese    Goals of care, counseling/discussion 11/23/2021   Hyperlipidemia    Lymphedema    MI (myocardial infarction) (HCC)    was in [place]   Obesity    Pancreatitis    Personal history of chemotherapy    Personal history of radiation therapy    Sleep apnea     PAST SURGICAL HISTORY:  Past Surgical History:  Procedure Laterality Date   BREAST LUMPECTOMY Left 03/2019   CATARACT EXTRACTION Bilateral    CHOLECYSTECTOMY     IR IMAGING GUIDED PORT INSERTION  12/15/2021   IR REMOVAL TUN ACCESS W/ PORT W/O FL MOD SED  12/20/2019   IR US GUIDE BX ASP/DRAIN  11/02/2021   LEFT HEART CATH AND CORONARY ANGIOGRAPHY N/A 08/22/2020   Procedure: LEFT HEART CATH AND CORONARY ANGIOGRAPHY;  Surgeon: Kathleene Hazel, MD;  Location: [facility];  Service: Cardiovascular;  Laterality: N/A;   TRIGGER FINGER RELEASE Bilateral    x 5   TUBAL LIGATION  UMBILICAL HERNIA REPAIR     x 2   uterine ablation      HEMATOLOGY/ONCOLOGY HISTORY:  Oncology History  Breast cancer (HCC)  11/26/2019 Initial Diagnosis   Breast cancer (HCC)   11/26/2019 Cancer Staging   Staging form: Breast, AJCC 8th Edition - Pathologic: Stage IIA (pT2, pN0, cM0, G3, ER-, PR-, HER2-) - Signed by Arthur Holms, MD on 11/26/2019   11/23/2021 Cancer Staging   Staging form: Breast, AJCC 8th Edition - Pathologic stage from 11/23/2021: Stage IV (pT2, pN3c, cM1, G2, ER-, PR-, HER2-) - Signed by [name], MD on 11/23/2021 Nuclear grade: G2 Histologic grading system: 3 grade system   11/30/2021 - 06/01/2022 Chemotherapy    Patient is on Treatment Plan : BREAST Pembrolizumab (200) D1 + Carboplatin (2) D1,8 + Gemcitabine (1000) D1,8 q21d     07/01/2022 - 10/06/2022 Chemotherapy   Patient is on Treatment Plan : BREAST METASTATIC Sacituzumab govitecan-hziy [name]) D1,8 q21d       ALLERGIES:  is allergic to lithium, dulaglutide, and penicillins.  MEDICATIONS:  Current Outpatient Medications  Medication Sig Dispense Refill   ACCU-CHEK GUIDE test strip 3 (three) times daily.     albuterol (ACCUNEB) 1.25 MG/3ML nebulizer solution Take 1 ampule by nebulization 4 (four) times daily as needed for wheezing or shortness of breath.     albuterol (VENTOLIN HFA) 108 (90 Base) MCG/ACT inhaler Inhale 2 puffs into the lungs every 6 (six) hours as needed for wheezing or shortness of breath. 18 g 2   Continuous Glucose Receiver (FREESTYLE LIBRE 2 READER) DEVI Use with freestyle libre 2 sensors 1 each 0   Continuous Glucose Sensor (FREESTYLE LIBRE 2 SENSOR) MISC Apply one every 14 days to continuously monitor blood sugar 2 each 3   cyclobenzaprine (FLEXERIL) 5 MG tablet Take 1 tablet (5 mg total) by mouth 2 (two) times daily as needed for muscle spasms. 90 tablet 0   diclofenac (VOLTAREN) 75 MG EC tablet Take 1 tablet (75 mg total) by mouth 2 (two) times daily. 60 tablet 1   fenofibrate (TRICOR) 145 MG tablet Take 145 mg by mouth daily.     HYDROcodone-acetaminophen (NORCO) 7.5-325 MG tablet Take 1 tablet by mouth every 6 (six) hours as needed for moderate pain. 120 tablet 0   ibuprofen (ADVIL) 800 MG tablet Take 1 tablet (800 mg total) by mouth 3 (three) times daily. 21 tablet 0   insulin aspart (NOVOLOG FLEXPEN) 100 UNIT/ML FlexPen Inject 3 Units into the skin 3 (three) times daily with meals. (Patient taking differently: Inject 10-16 Units into the skin 3 (three) times daily with meals.) 15 mL 0   insulin degludec (TRESIBA FLEXTOUCH) 100 UNIT/ML FlexTouch Pen Inject 25 Units into the skin daily.     insulin glargine (LANTUS)  100 UNIT/ML injection Inject 25 Units into the skin at bedtime.     lidocaine (LIDODERM) 5 % Place 1 patch onto the skin daily. Remove & Discard patch within 12 hours or as directed by MD 30 patch 0   linaclotide (LINZESS) 290 MCG CAPS capsule Take 1 capsule (290 mcg total) by mouth daily before breakfast. 30 capsule 3   metFORMIN (GLUCOPHAGE) 1000 MG tablet Take 1,000 mg by mouth 2 (two) times daily with a meal.     nitroGLYCERIN (NITROSTAT) 0.4 MG SL tablet Place 1 tablet (0.4 mg total) under the tongue every 5 (five) minutes as needed for chest pain. 25 tablet 3   ondansetron (ZOFRAN) 8 MG tablet TAKE 1 TAB  BY MOUTH EVERY 8 HOURS AS NEEDED FOR NAUSEA OR VOMITING. START ON THE THIRD DAY AFTER CHEMOTHERAPY. 30 tablet 1   oxybutynin (DITROPAN-XL) 5 MG 24 hr tablet Take 1 tablet (5 mg total) by mouth at bedtime. 90 tablet 1   pravastatin (PRAVACHOL) 40 MG tablet Take 1 tablet (40 mg total) by mouth daily. 90 tablet 3   QUEtiapine (SEROQUEL) 400 MG tablet Take 800 mg by mouth at bedtime.     No current facility-administered medications for this visit.    VITAL SIGNS: BP (!) 149/78 (BP Location: Right Arm, Patient Position: Sitting)   Pulse (!) 118   Temp 98.3 F (36.8 C) (Oral)   Resp 18   Wt 85.7 kg   LMP 10/04/2016 Comment: after "procedure to stop bleeding"  SpO2 99%   BMI 34.57 kg/m  Filed Weights   07/22/23 1331  Weight: 85.7 kg     Estimated body mass index is 34.57 kg/m as calculated from the following:   Height as of 07/14/23: 5\' 2"  (1.575 m).   Weight as of this encounter: 85.7 kg.  LABS: CBC:    Component Value Date/Time   WBC 7.6 07/22/2023 1305   WBC 7.5 06/12/2023 1632   HGB 13.2 07/22/2023 1305   HCT 39.2 07/22/2023 1305   PLT 337 07/22/2023 1305   MCV 92.2 07/22/2023 1305   NEUTROABS 5.1 07/22/2023 1305   LYMPHSABS 1.9 07/22/2023 1305   MONOABS 0.5 07/22/2023 1305   EOSABS 0.0 07/22/2023 1305   BASOSABS 0.0 07/22/2023 1305   Comprehensive Metabolic  Panel:    Component Value Date/Time   NA 135 07/22/2023 1305   K 4.8 07/22/2023 1305   CL 104 07/22/2023 1305   CO2 22 07/22/2023 1305   BUN 12 07/22/2023 1305   CREATININE 0.90 07/22/2023 1305   GLUCOSE 226 (H) 07/22/2023 1305   CALCIUM 9.2 07/22/2023 1305   AST 24 07/22/2023 1305   ALT 16 07/22/2023 1305   ALKPHOS 64 07/22/2023 1305   BILITOT 0.3 07/22/2023 1305   PROT 7.4 07/22/2023 1305   PROT 6.9 07/13/2021 0831   ALBUMIN 3.9 07/22/2023 1305   ALBUMIN 4.5 07/13/2021 0831    RADIOGRAPHIC STUDIES: US Soft Tissue Head/Neck (NON-THYROID)  Result Date: 06/28/2023 CLINICAL DATA:  Palpable abnormality involving the supraclavicular region of the left side of the neck. History of breast cancer. EXAM: ULTRASOUND OF HEAD/NECK SOFT TISSUES TECHNIQUE: Ultrasound examination of the head and neck soft tissues was performed in the area of clinical concern. COMPARISON:  None Available. FINDINGS: Sonographic evaluation of patient's palpable area of concern involving the supraclavicular region of the left side of the neck is without sonographic correlate. Specifically, no discrete solid or cystic lesions. No regional cervical/supraclavicular lymphadenopathy. IMPRESSION: No sonographic correlate for patient's palpable area of concern involving the supraclavicular region of the left side of the neck. Electronically Signed   By: Simonne Come M.D.   On: 06/28/2023 17:31    PERFORMANCE STATUS (ECOG) : 1 - Symptomatic but completely ambulatory  Review of Systems  Constitutional:  Negative for appetite change, fever and unexpected weight change.  HENT: Negative.    Eyes: Negative.   Respiratory: Negative.    Cardiovascular: Negative.   Gastrointestinal: Negative.   Neurological:  Negative for dizziness, facial asymmetry, speech difficulty and headaches.  Hematological:        Reports enlarged area in the left neck.  Psychiatric/Behavioral: Negative.      Physical Exam Vitals reviewed.   Constitutional:  General: She is not in acute distress. HENT:     Head: Normocephalic.  Eyes:     General: No scleral icterus.    Conjunctiva/sclera: Conjunctivae normal.  Cardiovascular:     Rate and Rhythm: Normal rate and regular rhythm.  Pulmonary:     Effort: Pulmonary effort is normal. No respiratory distress.     Breath sounds: Normal breath sounds.  Chest:  Breasts:    Right: No mass or nipple discharge.     Left: Swelling present.  Abdominal:     General: Bowel sounds are normal. There is no distension.     Palpations: Abdomen is soft. There is no mass.  Lymphadenopathy:     Cervical: Cervical adenopathy present.     Left cervical: Superficial cervical adenopathy present.  Skin:    General: Skin is warm and dry.  Neurological:     Mental Status: She is alert and oriented to person, place, and time.   Assessment and Plan- Patient is a 57 y.o. female    Encounter Diagnoses  Name Primary?   Malignant neoplasm of areola of breast in female, estrogen receptor positive, unspecified laterality (HCC) Yes   Malignant neoplasm involving both nipple and areola of right breast in female, unspecified estrogen receptor status (HCC)    Port-A-Cath in place    The patient continues to have swelling and tightness in her left breast related to lymphedema.  She continues to go to lymphedema clinic.  She is being seen by Tonita Cong will be having an MRI of the breast in the near future.  She was encouraged to continue to follow-up with her breast surgeon.  She has a palpable area in her left neck.  Tell on exam today at this is an edematous tissue area versus a discrete palpable lymph node.  He is due for a restaging PET scan in November 2024.  I will go ahead and order this a little early to further evaluate this area.  If insurance will not approve the PET scan, would recommend CT of the neck.  Labs from today reviewed.  Her CBC is normal.  CMP is normal with exception of  an elevated glucose.  CA 27.29 is pending today.  Her previous CA 27.29 levels have been normal.  And is for follow-up with lab work, office visit, Port-A-Cath flush in approximately 2 months.  Clenton Pare, NP

## 2023-07-23 LAB — CANCER ANTIGEN 27.29: CA 27.29: 56.6 U/mL — ABNORMAL HIGH (ref 0.0–38.6)

## 2023-07-25 ENCOUNTER — Encounter: Payer: Self-pay | Admitting: *Deleted

## 2023-07-25 ENCOUNTER — Other Ambulatory Visit: Payer: Self-pay | Admitting: *Deleted

## 2023-07-25 ENCOUNTER — Ambulatory Visit: Payer: Medicaid Other

## 2023-07-25 DIAGNOSIS — C50011 Malignant neoplasm of nipple and areola, right female breast: Secondary | ICD-10-CM | POA: Diagnosis not present

## 2023-07-25 DIAGNOSIS — C50912 Malignant neoplasm of unspecified site of left female breast: Secondary | ICD-10-CM

## 2023-07-25 DIAGNOSIS — I890 Lymphedema, not elsewhere classified: Secondary | ICD-10-CM

## 2023-07-25 DIAGNOSIS — M25512 Pain in left shoulder: Secondary | ICD-10-CM

## 2023-07-25 DIAGNOSIS — N644 Mastodynia: Secondary | ICD-10-CM

## 2023-07-25 NOTE — Therapy (Signed)
OUTPATIENT PHYSICAL THERAPY  UPPER EXTREMITY ONCOLOGY TREATMENT  Patient Name: Ariana Herrera MRN: [id number] DOB:04-Oct-1966, 57 y.o., female Today's Date: 07/25/2023  END OF SESSION:  PT End of Session - 07/25/23 1002     Visit Number 3    Number of Visits 12    Date for PT Re-Evaluation 08/18/23    PT Start Time 1003    PT Stop Time 1050    PT Time Calculation (min) 47 min    Activity Tolerance Patient tolerated treatment well    Behavior During Therapy WFL for tasks assessed/performed             Past Medical History:  Diagnosis Date   Anginal pain (HCC)    Anxiety    Arthritis    Asthma    Bipolar 1 disorder (HCC)    Bipolar disease, chronic (HCC)    Breast cancer in female (HCC)    2019 and recurrent in 2023 to right breast, right axillary, and left neck   Coronary artery disease    Deep vein blood clot of right lower extremity (HCC)    Diabetes mellitus type 2 in obese    Goals of care, counseling/discussion 11/23/2021   Hyperlipidemia    Lymphedema    MI (myocardial infarction) (HCC)    was in [place]   Obesity    Pancreatitis    Personal history of chemotherapy    Personal history of radiation therapy    Sleep apnea    Past Surgical History:  Procedure Laterality Date   BREAST LUMPECTOMY Left 03/2019   CATARACT EXTRACTION Bilateral    CHOLECYSTECTOMY     IR IMAGING GUIDED PORT INSERTION  12/15/2021   IR REMOVAL TUN ACCESS W/ PORT W/O FL MOD SED  12/20/2019   IR US GUIDE BX ASP/DRAIN  11/02/2021   LEFT HEART CATH AND CORONARY ANGIOGRAPHY N/A 08/22/2020   Procedure: LEFT HEART CATH AND CORONARY ANGIOGRAPHY;  Surgeon: Kathleene Hazel, MD;  Location: [facility];  Service: Cardiovascular;  Laterality: N/A;   TRIGGER FINGER RELEASE Bilateral    x 5   TUBAL LIGATION     UMBILICAL HERNIA REPAIR     x 2   uterine ablation     Patient Active Problem List   Diagnosis Date Noted   Tobacco use 07/14/2023   Type 2 diabetes mellitus  with hyperglycemia, with long-term current use of insulin (HCC) 06/23/2023   Stress incontinence of urine 06/23/2023   Moderate persistent asthma without complication 06/23/2023   Obesity (BMI 30-39.9) 06/23/2023   Chest pain of uncertain etiology    History of MI (myocardial infarction) 08/20/2020   History of pancreatitis 08/20/2020   Hyperlipidemia associated with type 2 diabetes mellitus (HCC) 08/20/2020   Bipolar disease, chronic (HCC)    Breast cancer (HCC) 11/26/2019   Chemotherapy-induced neuropathy (HCC) 11/26/2019   History of DVT (deep vein thrombosis) 11/26/2019      REFERRING PROVIDER: Kennon Holter MD  REFERRING DIAG: Breast Lymphedema s/p left Breast Cancer  THERAPY DIAG:  Malignant neoplasm of left female breast, unspecified estrogen receptor status, unspecified site of breast (HCC)  Acute pain of left shoulder  Breast pain, left  Lymphedema, not elsewhere classified  ONSET DATE: 2 months ago  Rationale for Evaluation and Treatment: Rehabilitation  SUBJECTIVE:  SUBJECTIVE STATEMENT:  07/25/2023 I found a different bra over the weekend and with the pad it made some impressions, and I massaged it some. The pad was comfortable. No more sharp pains through the nipple. It feels a lot softer too. My cancer may have come back. My cancer agent was high. I have a PET scan on the 10th set up.   Eval Pt has lymphedema in the Left breast and she had therapy for it. Her breast started getting larger and larger since November 2023. It was hot and painful 2 months ago and she was given antibiotics.  Her compression bra does not fit anymore and she has not tried the MLD because it had stopped working, She has pain and itching in the breast. She has a right breast MRI for a concerning spot on  Oct 4. She has a swollen left cervical LN.She is also having some left shoulder pain for which she had an injection on 06/14/2023. We are addressing the breast only. She has not not had any chemo since Dec 2023.  PERTINENT HISTORY:  DM, bipolar disorder, high cholesterol, asthma, history of blood clots, 3 heart blockages, breast cancer treatment completed lumpectomy 07/17/18 on the left with SLNB 0/1 nodes removed. Chemo completed except for 3 sessions and radiation completed all in [place].  Now Stage 4 Breast Cancer .Significant radiation fibrosis. Significant left shoulder pain after attempting MRI. Injected 06/14/23? Had prior treatment for Left breast lymphedema. Havi  PAIN:  Are you having pain? Yes NPRS scale: 5/10 Pain location: left breast Pain orientation: Other: entire left breast   PAIN TYPE: burning, throbbing, and itchy Pain description: constant  Aggravating factors: activity Relieving factors: nothing  PRECAUTIONS: DM, bipolar disorder, high cholesterol, asthma, history of blood clots, 3 heart blockages,  RED FLAGS: Bowel or bladder incontinence: Yes:      WEIGHT BEARING RESTRICTIONS: No  FALLS:  Has patient fallen in last 6 months? Yes. Number of falls 1  LIVING ENVIRONMENT: Lives with: lives with their spouse Lives in: Other RV  OCCUPATION: Not working  LEISURE: gardening, walking, but not presently  HAND DOMINANCE: right   PRIOR LEVEL OF FUNCTION: Independent  PATIENT GOALS: Decrease pain and swelling in left breast   OBJECTIVE:  COGNITION: Overall cognitive status: Within functional limits for tasks assessed   PALPATION: Very firm, tender  OBSERVATIONS / OTHER ASSESSMENTS: Left Breast extremely drawn up, red and firm. Much smaller than right breast. MD notes severe radiation fibrosis. Had prior infection 2 months ago now resolved .  SENSATION: Light touch: Deficits    POSTURE: Forward head, rounded shoulders  UPPER EXTREMITY  AROM/PROM:  A/PROM RIGHT   eval   Shoulder extension   Shoulder flexion   Shoulder abduction   Shoulder internal rotation   Shoulder external rotation     (Blank rows = not tested)  A/PROM LEFT   eval  Shoulder extension Pain in left shoulder;recent injection;does not want to treat  Shoulder flexion   Shoulder abduction   Shoulder internal rotation   Shoulder external rotation     (Blank rows = not tested)  CERVICAL AROM: All within normal limits:      UPPER EXTREMITY STRENGTH:   LYMPHEDEMA ASSESSMENTS:   SURGERY TYPE/DATE: Left Lumpectomy with SLNB 07/17/18  NUMBER OF LYMPH NODES REMOVED: 0/1  CHEMOTHERAPY: YES  RADIATION:YES  HORMONE TREATMENT: NO  INFECTIONS: YES, 2 months ago   LYMPHEDEMA ASSESSMENTS:   LANDMARK RIGHT  eval  At axilla    15  cm proximal to olecranon process   10 cm proximal to olecranon process 29.7  Olecranon process 25.1  15 cm proximal to ulnar styloid process   10 cm proximal to ulnar styloid process 22.7  Just proximal to ulnar styloid process 16.8  Across hand at thumb web space 19.3  At base of 2nd digit 6.3  (Blank rows = not tested)  LANDMARK LEFT  eval  At axilla    15 cm proximal to olecranon process   10 cm proximal to olecranon process 30  Olecranon process 26  15 cm proximal to ulnar styloid process   10 cm proximal to ulnar styloid process 21.8  Just proximal to ulnar styloid process 16.4  Across hand at thumb web space 19.0  At base of 2nd digit 6.2  (Blank rows = not tested)   FUNCTIONAL TESTS:    GAIT: WNL    QUICK DASH SURVEY: 36%  BREAST COMPLAINTS QUESTIONNAIRE Pain:7 Heaviness:7 Swollen feeling:7 Tense Skin:9 Redness:10 Bra Print:7 Size of Pores:10 Hard feeling: 10 Total:    63 /80 A Score over 9 indicates lymphedema issues in the breast   TODAY'S TREATMENT:      Pt permission and consent throughout each step of examination and treatment with modification and draping if requested  when working on sensitive areas. Avoid portacath on right chest                                                                                                                                      DATE:  07/25/2023 Pt wearing  another bra that still doesn't enclose breast very well, but there is a small impression from the spaghetti foam. Messaged Dr. Myna Hidalgo to try and get script for compression bra for pt.. Continued MLD and massage techniques to left breast to decrease firmness and swelling. Educated pt in gentle nature of technique, LN's, pathways and general information about MLD. Therapist performed MLD to the left breast as follows;In supine: Short neck, 5 diaphragmatic breaths, R axillary nodes and establishment of interaxillary pathway, L inguinal nodes and establishment of axilloinguinal pathway, then L breast moving fluid towards pathways spending extra time in any areas of fibrosis in entire breast then retracing all steps and ending with LN's. Pt also practiced all steps with multiple VC's and TC's required. Therapist completed MLD with verbalization to pt.  07/20/2023 Spaghetti foam pad (white) made for pts bra to try and soften breast. Pt thought she had a compression bra, but what she is wearing is actually very little compression. She will check at home for another bra. Initiated MLD and massage techniques to left breast to decrease firmness and swelling. Educated pt in gentle nature of technique, LN's, pathways and general information about MLD. Therapist performed MLD to the left breast as follows;In supine: Short neck, 5 diaphragmatic breaths, R axillary nodes and establishment of interaxillary pathway, L inguinal nodes and establishment of axilloinguinal  pathway, then L breast moving fluid towards pathways spending extra time in any areas of fibrosis in entire breast then retracing all steps and ending with LN's Helped pt get foam bra in her bra    EVAL Gave pt name of [facility] to see if her insurance would cover a new bra and if they had any suggestions to try and get compression on the left breast. She was going to call or go by there today    PATIENT EDUCATION:  Education details: POC, goals for rx, treatment Person educated: Patient Education method: Explanation Education comprehension: verbalized understanding  HOME EXERCISE PROGRAM: None given  ASSESSMENT:  CLINICAL IMPRESSION: continued MLD and fibrosis techniques to left breast and started educating pt. In technique and sequence. Breast is slightly softer, and slightly less dark. Pt awaiting a call about her cancer agent test which came back higher after her labs on Friday.  OBJECTIVE IMPAIRMENTS: decreased activity tolerance, decreased knowledge of condition, decreased mobility, decreased ROM, increased edema, impaired sensation, postural dysfunction, and pain.   ACTIVITY LIMITATIONS: lifting, sleeping, and reach over head  PARTICIPATION LIMITATIONS:  able to participate in some activities but with pain  PERSONAL FACTORS: 3+ comorbidities: stage IV breast cancer s/p chemo, radiation,DM,Heart problems  are also affecting patient's functional outcome.   REHAB POTENTIAL: Good  CLINICAL DECISION MAKING: Stable/uncomplicated  EVALUATION COMPLEXITY: Low  GOALS: Goals reviewed with patient? Yes  SHORT TERM GOALS= Long TERM GOALS: Target date: 08/18/2023  Pt will be independent with left breast MLD to decrease swelling Baseline: Goal status: INITIAL  2.  Pt will be tolerant of left breast compression to decrease edema Baseline:  Goal status: INITIAL  3.  Pt will have decreased left breast pain by 50% or greater Baseline:  Goal status: INITIAL  4.  Breast complaints survey will decrease to 30 or less to demonstrate improvements in left breast swelling/pain Baseline:  Goal status: INITIAL  PLAN:  PT FREQUENCY: 2x/week  PT DURATION: 6 weeks  PLANNED INTERVENTIONS: Therapeutic  exercises, Therapeutic activity, Patient/Family education, Self Care, Orthotic/Fit training, Manual lymph drainage, Compression bandaging, Vasopneumatic device, Manual therapy, and Re-evaluation  PLAN FOR NEXT SESSION: perform MLD and instruct pt, did she go to [facility] to see if they could suggest any compression?, chip pack or short stretch bandage?  Waynette Buttery, PT 07/25/2023, 10:51 AM

## 2023-07-26 ENCOUNTER — Other Ambulatory Visit (HOSPITAL_BASED_OUTPATIENT_CLINIC_OR_DEPARTMENT_OTHER): Payer: Self-pay

## 2023-07-26 ENCOUNTER — Other Ambulatory Visit: Payer: Self-pay | Admitting: Hematology & Oncology

## 2023-07-26 DIAGNOSIS — M25512 Pain in left shoulder: Secondary | ICD-10-CM

## 2023-07-26 DIAGNOSIS — C50011 Malignant neoplasm of nipple and areola, right female breast: Secondary | ICD-10-CM

## 2023-07-26 MED ORDER — HYDROCODONE-ACETAMINOPHEN 7.5-325 MG PO TABS
1.0000 | ORAL_TABLET | Freq: Four times a day (QID) | ORAL | 0 refills | Status: DC | PRN
  Filled 2023-07-26: qty 120, 30d supply, fill #0

## 2023-07-27 ENCOUNTER — Ambulatory Visit: Payer: Medicaid Other | Attending: Surgery

## 2023-07-27 DIAGNOSIS — I890 Lymphedema, not elsewhere classified: Secondary | ICD-10-CM | POA: Diagnosis present

## 2023-07-27 DIAGNOSIS — N644 Mastodynia: Secondary | ICD-10-CM | POA: Diagnosis present

## 2023-07-27 DIAGNOSIS — C50912 Malignant neoplasm of unspecified site of left female breast: Secondary | ICD-10-CM | POA: Insufficient documentation

## 2023-07-27 DIAGNOSIS — M25512 Pain in left shoulder: Secondary | ICD-10-CM | POA: Diagnosis present

## 2023-07-27 NOTE — Therapy (Signed)
OUTPATIENT PHYSICAL THERAPY  UPPER EXTREMITY ONCOLOGY TREATMENT  Patient Name: Ariana Herrera MRN: [id number] DOB:11-12-1965, 57 y.o., female Today's Date: 07/27/2023  END OF SESSION:  PT End of Session - 07/27/23 0902     Visit Number 4    Number of Visits 12    Date for PT Re-Evaluation 08/18/23    PT Start Time 0903    PT Stop Time 0950    PT Time Calculation (min) 47 min    Activity Tolerance Patient tolerated treatment well    Behavior During Therapy [facility] for tasks assessed/performed             Past Medical History:  Diagnosis Date   Anginal pain (HCC)    Anxiety    Arthritis    Asthma    Bipolar 1 disorder (HCC)    Bipolar disease, chronic (HCC)    Breast cancer in female (HCC)    2019 and recurrent in 2023 to right breast, right axillary, and left neck   Coronary artery disease    Deep vein blood clot of right lower extremity (HCC)    Diabetes mellitus type 2 in obese    Goals of care, counseling/discussion 11/23/2021   Hyperlipidemia    Lymphedema    MI (myocardial infarction) (HCC)    was in [place]   Obesity    Pancreatitis    Personal history of chemotherapy    Personal history of radiation therapy    Sleep apnea    Past Surgical History:  Procedure Laterality Date   BREAST LUMPECTOMY Left 03/2019   CATARACT EXTRACTION Bilateral    CHOLECYSTECTOMY     IR IMAGING GUIDED PORT INSERTION  12/15/2021   IR REMOVAL TUN ACCESS W/ PORT W/O FL MOD SED  12/20/2019   IR US GUIDE BX ASP/DRAIN  11/02/2021   LEFT HEART CATH AND CORONARY ANGIOGRAPHY N/A 08/22/2020   Procedure: LEFT HEART CATH AND CORONARY ANGIOGRAPHY;  Surgeon: Kathleene Hazel, MD;  Location: [facility];  Service: Cardiovascular;  Laterality: N/A;   TRIGGER FINGER RELEASE Bilateral    x 5   TUBAL LIGATION     UMBILICAL HERNIA REPAIR     x 2   uterine ablation     Patient Active Problem List   Diagnosis Date Noted   Tobacco use 07/14/2023   Type 2 diabetes mellitus  with hyperglycemia, with long-term current use of insulin (HCC) 06/23/2023   Stress incontinence of urine 06/23/2023   Moderate persistent asthma without complication 06/23/2023   Obesity (BMI 30-39.9) 06/23/2023   Chest pain of uncertain etiology    History of MI (myocardial infarction) 08/20/2020   History of pancreatitis 08/20/2020   Hyperlipidemia associated with type 2 diabetes mellitus (HCC) 08/20/2020   Bipolar disease, chronic (HCC)    Breast cancer (HCC) 11/26/2019   Chemotherapy-induced neuropathy (HCC) 11/26/2019   History of DVT (deep vein thrombosis) 11/26/2019      REFERRING PROVIDER: Kennon Holter MD  REFERRING DIAG: Breast Lymphedema s/p left Breast Cancer  THERAPY DIAG:  Malignant neoplasm of left female breast, unspecified estrogen receptor status, unspecified site of breast (HCC)  Acute pain of left shoulder  Breast pain, left  Lymphedema, not elsewhere classified  ONSET DATE: 2 months ago  Rationale for Evaluation and Treatment: Rehabilitation  SUBJECTIVE:  SUBJECTIVE STATEMENT:  07/26/2023 I got the prescription for the bra. Sharp pain is still better. I didn't find any other bras at home so I just put on the tightest. I have a breast MRI on Friday, and PET scan Oct 10. Before I se Dr. Myna Hidalgo   Eval Pt has lymphedema in the Left breast and she had therapy for it. Her breast started getting larger and larger since November 2023. It was hot and painful 2 months ago and she was given antibiotics.  Her compression bra does not fit anymore and she has not tried the MLD because it had stopped working, She has pain and itching in the breast. She has a right breast MRI for a concerning spot on Oct 4. She has a swollen left cervical LN.She is also having some left shoulder pain  for which she had an injection on 06/14/2023. We are addressing the breast only. She has not not had any chemo since Dec 2023.  PERTINENT HISTORY:  DM, bipolar disorder, high cholesterol, asthma, history of blood clots, 3 heart blockages, breast cancer treatment completed lumpectomy 07/17/18 on the left with SLNB 0/1 nodes removed. Chemo completed except for 3 sessions and radiation completed all in [place].  Now Stage 4 Breast Cancer .Significant radiation fibrosis. Significant left shoulder pain after attempting MRI. Injected 06/14/23? Had prior treatment for Left breast lymphedema. Havi  PAIN:  Are you having pain? Yes NPRS scale: 5/10, but no sharp pains anymore Pain location: left breast Pain orientation: Other: entire left breast   PAIN TYPE: burning, throbbing, and itchy Pain description: constant  Aggravating factors: activity Relieving factors: nothing  PRECAUTIONS: DM, bipolar disorder, high cholesterol, asthma, history of blood clots, 3 heart blockages,  RED FLAGS: Bowel or bladder incontinence: Yes:      WEIGHT BEARING RESTRICTIONS: No  FALLS:  Has patient fallen in last 6 months? Yes. Number of falls 1  LIVING ENVIRONMENT: Lives with: lives with their spouse Lives in: Other RV  OCCUPATION: Not working  LEISURE: gardening, walking, but not presently  HAND DOMINANCE: right   PRIOR LEVEL OF FUNCTION: Independent  PATIENT GOALS: Decrease pain and swelling in left breast   OBJECTIVE:  COGNITION: Overall cognitive status: Within functional limits for tasks assessed   PALPATION: Very firm, tender  OBSERVATIONS / OTHER ASSESSMENTS: Left Breast extremely drawn up, red and firm. Much smaller than right breast. MD notes severe radiation fibrosis. Had prior infection 2 months ago now resolved .  SENSATION: Light touch: Deficits    POSTURE: Forward head, rounded shoulders  UPPER EXTREMITY AROM/PROM:  A/PROM RIGHT   eval   Shoulder extension   Shoulder  flexion   Shoulder abduction   Shoulder internal rotation   Shoulder external rotation     (Blank rows = not tested)  A/PROM LEFT   eval  Shoulder extension Pain in left shoulder;recent injection;does not want to treat  Shoulder flexion   Shoulder abduction   Shoulder internal rotation   Shoulder external rotation     (Blank rows = not tested)  CERVICAL AROM: All within normal limits:      UPPER EXTREMITY STRENGTH:   LYMPHEDEMA ASSESSMENTS:   SURGERY TYPE/DATE: Left Lumpectomy with SLNB 07/17/18  NUMBER OF LYMPH NODES REMOVED: 0/1  CHEMOTHERAPY: YES  RADIATION:YES  HORMONE TREATMENT: NO  INFECTIONS: YES, 2 months ago   LYMPHEDEMA ASSESSMENTS:   LANDMARK RIGHT  eval  At axilla    15 cm proximal to olecranon process   10 cm proximal  to olecranon process 29.7  Olecranon process 25.1  15 cm proximal to ulnar styloid process   10 cm proximal to ulnar styloid process 22.7  Just proximal to ulnar styloid process 16.8  Across hand at thumb web space 19.3  At base of 2nd digit 6.3  (Blank rows = not tested)  LANDMARK LEFT  eval  At axilla    15 cm proximal to olecranon process   10 cm proximal to olecranon process 30  Olecranon process 26  15 cm proximal to ulnar styloid process   10 cm proximal to ulnar styloid process 21.8  Just proximal to ulnar styloid process 16.4  Across hand at thumb web space 19.0  At base of 2nd digit 6.2  (Blank rows = not tested)   FUNCTIONAL TESTS:    GAIT: WNL    QUICK DASH SURVEY: 36%  BREAST COMPLAINTS QUESTIONNAIRE Pain:7 Heaviness:7 Swollen feeling:7 Tense Skin:9 Redness:10 Bra Print:7 Size of Pores:10 Hard feeling: 10 Total:    63 /80 A Score over 9 indicates lymphedema issues in the breast   TODAY'S TREATMENT:      Pt permission and consent throughout each step of examination and treatment with modification and draping if requested when working on sensitive areas. Avoid portacath on right chest                                                                                                                                       DATE:  07/27/2023 a small impression from the spaghetti foam noted on left breast Continued MLD and massage techniques to left breast to decrease firmness and swelling. Educated pt in gentle nature of technique, LN's, pathways and general information about MLD. Therapist performed MLD to the left breast as follows;In supine: Short neck, 5 diaphragmatic breaths, R axillary nodes and establishment of interaxillary pathway, L inguinal nodes and establishment of axilloinguinal pathway, then L breast moving fluid towards pathways spending extra time in any areas of fibrosis in entire breast then retracing all steps and ending with LN's. Pt also practiced all steps with multiple VC's and TC's required. Therapist completed MLD with verbalization to pt. While performing. Verbally reviewed sequence together again at completion of treatment.   07/25/2023 Pt wearing  another bra that still doesn't enclose breast very well, but there is a small impression from the spaghetti foam. Messaged Dr. Myna Hidalgo to try and get script for compression bra for pt.. Continued MLD and massage techniques to left breast to decrease firmness and swelling. Educated pt in gentle nature of technique, LN's, pathways and general information about MLD. Therapist performed MLD to the left breast as follows;In supine: Short neck, 5 diaphragmatic breaths, R axillary nodes and establishment of interaxillary pathway, L inguinal nodes and establishment of axilloinguinal pathway, then L breast moving fluid towards pathways spending extra time in any areas of fibrosis in entire breast then retracing all steps and ending  with LN's. Pt also practiced all steps with multiple VC's and TC's required. Therapist completed MLD with verbalization to pt.  07/20/2023 Spaghetti foam pad (white) made for pts bra to try and soften breast. Pt  thought she had a compression bra, but what she is wearing is actually very little compression. She will check at home for another bra. Initiated MLD and massage techniques to left breast to decrease firmness and swelling. Educated pt in gentle nature of technique, LN's, pathways and general information about MLD. Therapist performed MLD to the left breast as follows;In supine: Short neck, 5 diaphragmatic breaths, R axillary nodes and establishment of interaxillary pathway, L inguinal nodes and establishment of axilloinguinal pathway, then L breast moving fluid towards pathways spending extra time in any areas of fibrosis in entire breast then retracing all steps and ending with LN's Helped pt get foam bra in her bra    EVAL Gave pt name of [facility] [facility] to see if her insurance would cover a new bra and if they had any suggestions to try and get compression on the left breast. She was going to call or go by there today    PATIENT EDUCATION:  Education details: POC, goals for rx, treatment Person educated: Patient Education method: Explanation Education comprehension: verbalized understanding  HOME EXERCISE PROGRAM: None given  ASSESSMENT:  CLINICAL IMPRESSION: continued MLD and fibrosis techniques to left breast and continued educating pt. In technique and sequence. Breast continues to be slightly softer, and slightly less dark. Pt continues to require multiple TC's for proper stretch and to keep hand flat for techniques. She seems to have a better idea of sequence after practicing today.  OBJECTIVE IMPAIRMENTS: decreased activity tolerance, decreased knowledge of condition, decreased mobility, decreased ROM, increased edema, impaired sensation, postural dysfunction, and pain.   ACTIVITY LIMITATIONS: lifting, sleeping, and reach over head  PARTICIPATION LIMITATIONS:  able to participate in some activities but with pain  PERSONAL FACTORS: 3+ comorbidities: stage IV breast cancer s/p  chemo, radiation,DM,Heart problems  are also affecting patient's functional outcome.   REHAB POTENTIAL: Good  CLINICAL DECISION MAKING: Stable/uncomplicated  EVALUATION COMPLEXITY: Low  GOALS: Goals reviewed with patient? Yes  SHORT TERM GOALS= Long TERM GOALS: Target date: 08/18/2023  Pt will be independent with left breast MLD to decrease swelling Baseline: Goal status: INITIAL  2.  Pt will be tolerant of left breast compression to decrease edema Baseline:  Goal status: INITIAL  3.  Pt will have decreased left breast pain by 50% or greater Baseline:  Goal status: INITIAL  4.  Breast complaints survey will decrease to 30 or less to demonstrate improvements in left breast swelling/pain Baseline:  Goal status: INITIAL  PLAN:  PT FREQUENCY: 2x/week  PT DURATION: 6 weeks  PLANNED INTERVENTIONS: Therapeutic exercises, Therapeutic activity, Patient/Family education, Self Care, Orthotic/Fit training, Manual lymph drainage, Compression bandaging, Vasopneumatic device, Manual therapy, and Re-evaluation  PLAN FOR NEXT SESSION: perform MLD and instruct pt, did she go to [facility] to see if they could suggest any compression?, chip pack or short stretch bandage?  Waynette Buttery, PT 07/27/2023, 9:54 AM

## 2023-07-29 ENCOUNTER — Ambulatory Visit
Admission: RE | Admit: 2023-07-29 | Discharge: 2023-07-29 | Disposition: A | Discharge status: Home / Self Care | Payer: Medicaid Other | Source: Ambulatory Visit | Attending: Surgery | Admitting: Surgery

## 2023-07-29 DIAGNOSIS — C50912 Malignant neoplasm of unspecified site of left female breast: Secondary | ICD-10-CM

## 2023-07-29 MED ORDER — GADOPICLENOL 0.5 MMOL/ML IV SOLN
9.0000 mL | Freq: Once | INTRAVENOUS | Status: AC | PRN
  Administered 2023-07-29: 9 mL via INTRAVENOUS

## 2023-08-01 ENCOUNTER — Ambulatory Visit: Payer: Medicaid Other

## 2023-08-01 DIAGNOSIS — C50912 Malignant neoplasm of unspecified site of left female breast: Secondary | ICD-10-CM

## 2023-08-01 DIAGNOSIS — N644 Mastodynia: Secondary | ICD-10-CM

## 2023-08-01 DIAGNOSIS — I890 Lymphedema, not elsewhere classified: Secondary | ICD-10-CM

## 2023-08-01 DIAGNOSIS — M25512 Pain in left shoulder: Secondary | ICD-10-CM

## 2023-08-01 NOTE — Therapy (Signed)
OUTPATIENT PHYSICAL THERAPY  UPPER EXTREMITY ONCOLOGY TREATMENT  Patient Name: MAECIE KILZER MRN: [id number] DOB:03-05-1966, 57 y.o., female Today's Date: 08/01/2023  END OF SESSION:  PT End of Session - 08/01/23 1059     Visit Number 5    Date for PT Re-Evaluation 08/18/23    PT Start Time 1101    PT Stop Time 1154    PT Time Calculation (min) 53 min    Activity Tolerance Patient tolerated treatment well    Behavior During Therapy WFL for tasks assessed/performed             Past Medical History:  Diagnosis Date   Anginal pain (HCC)    Anxiety    Arthritis    Asthma    Bipolar 1 disorder (HCC)    Bipolar disease, chronic (HCC)    Breast cancer in female (HCC)    2019 and recurrent in 2023 to right breast, right axillary, and left neck   Coronary artery disease    Deep vein blood clot of right lower extremity (HCC)    Diabetes mellitus type 2 in obese    Goals of care, counseling/discussion 11/23/2021   Hyperlipidemia    Lymphedema    MI (myocardial infarction) (HCC)    was in [place]   Obesity    Pancreatitis    Personal history of chemotherapy    Personal history of radiation therapy    Sleep apnea    Past Surgical History:  Procedure Laterality Date   BREAST LUMPECTOMY Left 03/2019   CATARACT EXTRACTION Bilateral    CHOLECYSTECTOMY     IR IMAGING GUIDED PORT INSERTION  12/15/2021   IR REMOVAL TUN ACCESS W/ PORT W/O FL MOD SED  12/20/2019   IR US GUIDE BX ASP/DRAIN  11/02/2021   LEFT HEART CATH AND CORONARY ANGIOGRAPHY N/A 08/22/2020   Procedure: LEFT HEART CATH AND CORONARY ANGIOGRAPHY;  Surgeon: Kathleene Hazel, MD;  Location: [facility];  Service: Cardiovascular;  Laterality: N/A;   TRIGGER FINGER RELEASE Bilateral    x 5   TUBAL LIGATION     UMBILICAL HERNIA REPAIR     x 2   uterine ablation     Patient Active Problem List   Diagnosis Date Noted   Tobacco use 07/14/2023   Type 2 diabetes mellitus with hyperglycemia, with  long-term current use of insulin (HCC) 06/23/2023   Stress incontinence of urine 06/23/2023   Moderate persistent asthma without complication 06/23/2023   Obesity (BMI 30-39.9) 06/23/2023   Chest pain of uncertain etiology    History of MI (myocardial infarction) 08/20/2020   History of pancreatitis 08/20/2020   Hyperlipidemia associated with type 2 diabetes mellitus (HCC) 08/20/2020   Bipolar disease, chronic (HCC)    Breast cancer (HCC) 11/26/2019   Chemotherapy-induced neuropathy (HCC) 11/26/2019   History of DVT (deep vein thrombosis) 11/26/2019      REFERRING PROVIDER: Kennon Holter MD  REFERRING DIAG: Breast Lymphedema s/p left Breast Cancer  THERAPY DIAG:  Malignant neoplasm of left female breast, unspecified estrogen receptor status, unspecified site of breast (HCC)  Acute pain of left shoulder  Breast pain, left  Lymphedema, not elsewhere classified  ONSET DATE: 2 months ago  Rationale for Evaluation and Treatment: Rehabilitation  SUBJECTIVE:  SUBJECTIVE STATEMENT:  08/01/2023 I got my compression bra and I think its helpful; I do think it is softer. No result from MRI yet but have Pet Scan on Thursday. I tried the MLD over the weekend and as long as I look at the paper I think I do well   Eval Pt has lymphedema in the Left breast and she had therapy for it. Her breast started getting larger and larger since November 2023. It was hot and painful 2 months ago and she was given antibiotics.  Her compression bra does not fit anymore and she has not tried the MLD because it had stopped working, She has pain and itching in the breast. She has a right breast MRI for a concerning spot on Oct 4. She has a swollen left cervical LN.She is also having some left shoulder pain for which she had  an injection on 06/14/2023. We are addressing the breast only. She has not not had any chemo since Dec 2023.  PERTINENT HISTORY:  DM, bipolar disorder, high cholesterol, asthma, history of blood clots, 3 heart blockages, breast cancer treatment completed lumpectomy 07/17/18 on the left with SLNB 0/1 nodes removed. Chemo completed except for 3 sessions and radiation completed all in [place].  Now Stage 4 Breast Cancer .Significant radiation fibrosis. Significant left shoulder pain after attempting MRI. Injected 06/14/23? Had prior treatment for Left breast lymphedema. Havi  PAIN:  Are you having pain? no  PRECAUTIONS: DM, bipolar disorder, high cholesterol, asthma, history of blood clots, 3 heart blockages,  RED FLAGS: Bowel or bladder incontinence: Yes:      WEIGHT BEARING RESTRICTIONS: No  FALLS:  Has patient fallen in last 6 months? Yes. Number of falls 1  LIVING ENVIRONMENT: Lives with: lives with their spouse Lives in: Other RV  OCCUPATION: Not working  LEISURE: gardening, walking, but not presently  HAND DOMINANCE: right   PRIOR LEVEL OF FUNCTION: Independent  PATIENT GOALS: Decrease pain and swelling in left breast   OBJECTIVE:  COGNITION: Overall cognitive status: Within functional limits for tasks assessed   PALPATION: Very firm, tender  OBSERVATIONS / OTHER ASSESSMENTS: Left Breast extremely drawn up, red and firm. Much smaller than right breast. MD notes severe radiation fibrosis. Had prior infection 2 months ago now resolved .  SENSATION: Light touch: Deficits    POSTURE: Forward head, rounded shoulders  UPPER EXTREMITY AROM/PROM:  A/PROM RIGHT   eval   Shoulder extension   Shoulder flexion   Shoulder abduction   Shoulder internal rotation   Shoulder external rotation     (Blank rows = not tested)  A/PROM LEFT   eval  Shoulder extension Pain in left shoulder;recent injection;does not want to treat  Shoulder flexion   Shoulder abduction    Shoulder internal rotation   Shoulder external rotation     (Blank rows = not tested)  CERVICAL AROM: All within normal limits:      UPPER EXTREMITY STRENGTH:   LYMPHEDEMA ASSESSMENTS:   SURGERY TYPE/DATE: Left Lumpectomy with SLNB 07/17/18  NUMBER OF LYMPH NODES REMOVED: 0/1  CHEMOTHERAPY: YES  RADIATION:YES  HORMONE TREATMENT: NO  INFECTIONS: YES, 2 months ago   LYMPHEDEMA ASSESSMENTS:   LANDMARK RIGHT  eval  At axilla    15 cm proximal to olecranon process   10 cm proximal to olecranon process 29.7  Olecranon process 25.1  15 cm proximal to ulnar styloid process   10 cm proximal to ulnar styloid process 22.7  Just proximal to ulnar styloid  process 16.8  Across hand at thumb web space 19.3  At base of 2nd digit 6.3  (Blank rows = not tested)  LANDMARK LEFT  eval  At axilla    15 cm proximal to olecranon process   10 cm proximal to olecranon process 30  Olecranon process 26  15 cm proximal to ulnar styloid process   10 cm proximal to ulnar styloid process 21.8  Just proximal to ulnar styloid process 16.4  Across hand at thumb web space 19.0  At base of 2nd digit 6.2  (Blank rows = not tested)   FUNCTIONAL TESTS:    GAIT: WNL    QUICK DASH SURVEY: 36%  BREAST COMPLAINTS QUESTIONNAIRE Pain:7 Heaviness:7 Swollen feeling:7 Tense Skin:9 Redness:10 Bra Print:7 Size of Pores:10 Hard feeling: 10 Total:    63 /80 A Score over 9 indicates lymphedema issues in the breast   TODAY'S TREATMENT:      Pt permission and consent throughout each step of examination and treatment with modification and draping if requested when working on sensitive areas. Avoid portacath on right chest                                                                                                                                      DATE:  08/01/2023 Wearing jobst pad in compression bra, new [place] bra Continued MLD and massage techniques to left breast to decrease  firmness and swelling. Educated pt in gentle nature of technique, LN's, pathways and general information about MLD. Therapist performed MLD to the left breast as follows;In supine: Short neck, 5 diaphragmatic breaths, R axillary nodes and establishment of interaxillary pathway, L inguinal nodes and establishment of axilloinguinal pathway, then L breast moving fluid towards pathways spending extra time in any areas of fibrosis in entire breast then retracing all steps and ending with LN's. Pt also practiced all steps with moderate VC's and TC's required. Therapist completed MLD with verbalization to pt. While performing.  Showed pt scapular retraction with yellow x 10 and left ER with yellow x5. Pt was given exs by shoulder MD and wanted to be shown how to do. Advised to keep in small ROM with good posture and no pain. Gave pt yellow to start with instead of red given by MD  07/27/2023 a small impression from the spaghetti foam noted on left breast Continued MLD and massage techniques to left breast to decrease firmness and swelling. Educated pt in gentle nature of technique, LN's, pathways and general information about MLD. Therapist performed MLD to the left breast as follows;In supine: Short neck, 5 diaphragmatic breaths, R axillary nodes and establishment of interaxillary pathway, L inguinal nodes and establishment of axilloinguinal pathway, then L breast moving fluid towards pathways spending extra time in any areas of fibrosis in entire breast then retracing all steps and ending with LN's. Pt also practiced all steps with multiple VC's and TC's required. Therapist  completed MLD with verbalization to pt. While performing. Verbally reviewed sequence together again at completion of treatment.   07/25/2023 Pt wearing  another bra that still doesn't enclose breast very well, but there is a small impression from the spaghetti foam. Messaged Dr. Myna Hidalgo to try and get script for compression bra for  pt.. Continued MLD and massage techniques to left breast to decrease firmness and swelling. Educated pt in gentle nature of technique, LN's, pathways and general information about MLD. Therapist performed MLD to the left breast as follows;In supine: Short neck, 5 diaphragmatic breaths, R axillary nodes and establishment of interaxillary pathway, L inguinal nodes and establishment of axilloinguinal pathway, then L breast moving fluid towards pathways spending extra time in any areas of fibrosis in entire breast then retracing all steps and ending with LN's. Pt also practiced all steps with multiple VC's and TC's required. Therapist completed MLD with verbalization to pt.  07/20/2023 Spaghetti foam pad (white) made for pts bra to try and soften breast. Pt thought she had a compression bra, but what she is wearing is actually very little compression. She will check at home for another bra. Initiated MLD and massage techniques to left breast to decrease firmness and swelling. Educated pt in gentle nature of technique, LN's, pathways and general information about MLD. Therapist performed MLD to the left breast as follows;In supine: Short neck, 5 diaphragmatic breaths, R axillary nodes and establishment of interaxillary pathway, L inguinal nodes and establishment of axilloinguinal pathway, then L breast moving fluid towards pathways spending extra time in any areas of fibrosis in entire breast then retracing all steps and ending with LN's Helped pt get foam bra in her bra    EVAL Gave pt name of [facility] to see if her insurance would cover a new bra and if they had any suggestions to try and get compression on the left breast. She was going to call or go by there today    PATIENT EDUCATION:  Education details: POC, goals for rx, treatment Person educated: Patient Education method: Explanation Education comprehension: verbalized understanding  HOME EXERCISE PROGRAM: None  given  ASSESSMENT:  CLINICAL IMPRESSION: Pt has her new bra and is comfortable wearing it. Overall pain much better since wearing compression and has no pain when arriving today. Continued review of MLD and fibrosis technique with pt continuing to require VC's and TC's for hand positioning and technique. Educated pt in standing band for Scapular retraction and ER with yellow at her request as her shoulder MD had given her some pictures. Reminded about posture and to keep in comfortable ROM. Needs review next visit  OBJECTIVE IMPAIRMENTS: decreased activity tolerance, decreased knowledge of condition, decreased mobility, decreased ROM, increased edema, impaired sensation, postural dysfunction, and pain.   ACTIVITY LIMITATIONS: lifting, sleeping, and reach over head  PARTICIPATION LIMITATIONS:  able to participate in some activities but with pain  PERSONAL FACTORS: 3+ comorbidities: stage IV breast cancer s/p chemo, radiation,DM,Heart problems  are also affecting patient's functional outcome.   REHAB POTENTIAL: Good  CLINICAL DECISION MAKING: Stable/uncomplicated  EVALUATION COMPLEXITY: Low  GOALS: Goals reviewed with patient? Yes  SHORT TERM GOALS= Long TERM GOALS: Target date: 08/18/2023  Pt will be independent with left breast MLD to decrease swelling Baseline: Goal status: INITIAL  2.  Pt will be tolerant of left breast compression to decrease edema Baseline:  Goal status: MET 08/01/2023 (has compression bra nowand pain greatly improved) 3.  Pt will have decreased left breast pain  by 50% or greater Baseline:  Goal status: INITIAL  4.  Breast complaints survey will decrease to 30 or less to demonstrate improvements in left breast swelling/pain Baseline:  Goal status: INITIAL  PLAN:  PT FREQUENCY: 2x/week  PT DURATION: 6 weeks  PLANNED INTERVENTIONS: Therapeutic exercises, Therapeutic activity, Patient/Family education, Self Care, Orthotic/Fit training, Manual lymph  drainage, Compression bandaging, Vasopneumatic device, Manual therapy, and Re-evaluation  PLAN FOR NEXT SESSION:Review standing postural exs with band and any others given by shoulder MD, perform MLD and instruct pt, did she go to [facility] to see if they could suggest any compression?, chip pack or short stretch bandage?  Waynette Buttery, PT 08/01/2023, 11:56 AM

## 2023-08-03 ENCOUNTER — Ambulatory Visit: Payer: Medicaid Other

## 2023-08-03 DIAGNOSIS — I890 Lymphedema, not elsewhere classified: Secondary | ICD-10-CM

## 2023-08-03 DIAGNOSIS — N644 Mastodynia: Secondary | ICD-10-CM

## 2023-08-03 DIAGNOSIS — C50912 Malignant neoplasm of unspecified site of left female breast: Secondary | ICD-10-CM

## 2023-08-03 DIAGNOSIS — M25512 Pain in left shoulder: Secondary | ICD-10-CM

## 2023-08-03 NOTE — Therapy (Signed)
OUTPATIENT PHYSICAL THERAPY  UPPER EXTREMITY ONCOLOGY TREATMENT  Patient Name: Ariana Herrera MRN: [id number] DOB:Oct 02, 1966, 57 y.o., female Today's Date: 08/03/2023  END OF SESSION:  PT End of Session - 08/03/23 1000     Visit Number 6    Number of Visits 12    Date for PT Re-Evaluation 08/18/23    PT Start Time 1002    PT Stop Time 1053    PT Time Calculation (min) 51 min    Activity Tolerance Patient tolerated treatment well    Behavior During Therapy WFL for tasks assessed/performed             Past Medical History:  Diagnosis Date   Anginal pain (HCC)    Anxiety    Arthritis    Asthma    Bipolar 1 disorder (HCC)    Bipolar disease, chronic (HCC)    Breast cancer in female (HCC)    2019 and recurrent in 2023 to right breast, right axillary, and left neck   Coronary artery disease    Deep vein blood clot of right lower extremity (HCC)    Diabetes mellitus type 2 in obese    Goals of care, counseling/discussion 11/23/2021   Hyperlipidemia    Lymphedema    MI (myocardial infarction) (HCC)    was in [place]   Obesity    Pancreatitis    Personal history of chemotherapy    Personal history of radiation therapy    Sleep apnea    Past Surgical History:  Procedure Laterality Date   BREAST LUMPECTOMY Left 03/2019   CATARACT EXTRACTION Bilateral    CHOLECYSTECTOMY     IR IMAGING GUIDED PORT INSERTION  12/15/2021   IR REMOVAL TUN ACCESS W/ PORT W/O FL MOD SED  12/20/2019   IR US GUIDE BX ASP/DRAIN  11/02/2021   LEFT HEART CATH AND CORONARY ANGIOGRAPHY N/A 08/22/2020   Procedure: LEFT HEART CATH AND CORONARY ANGIOGRAPHY;  Surgeon: Kathleene Hazel, MD;  Location: [facility];  Service: Cardiovascular;  Laterality: N/A;   TRIGGER FINGER RELEASE Bilateral    x 5   TUBAL LIGATION     UMBILICAL HERNIA REPAIR     x 2   uterine ablation     Patient Active Problem List   Diagnosis Date Noted   Tobacco use 07/14/2023   Type 2 diabetes mellitus  with hyperglycemia, with long-term current use of insulin (HCC) 06/23/2023   Stress incontinence of urine 06/23/2023   Moderate persistent asthma without complication 06/23/2023   Obesity (BMI 30-39.9) 06/23/2023   Chest pain of uncertain etiology    History of MI (myocardial infarction) 08/20/2020   History of pancreatitis 08/20/2020   Hyperlipidemia associated with type 2 diabetes mellitus (HCC) 08/20/2020   Bipolar disease, chronic (HCC)    Breast cancer (HCC) 11/26/2019   Chemotherapy-induced neuropathy (HCC) 11/26/2019   History of DVT (deep vein thrombosis) 11/26/2019      REFERRING PROVIDER: Kennon Holter MD  REFERRING DIAG: Breast Lymphedema s/p left Breast Cancer  THERAPY DIAG:  Malignant neoplasm of left female breast, unspecified estrogen receptor status, unspecified site of breast (HCC)  Acute pain of left shoulder  Breast pain, left  Lymphedema, not elsewhere classified  ONSET DATE: 2 months ago  Rationale for Evaluation and Treatment: Rehabilitation  SUBJECTIVE:  SUBJECTIVE STATEMENT:  08/01/2023 The bra is doing well and feels comfortable. Pain is still better. They want me to have a biopsy on my left breast, but I have a PET scan first.  Eval Pt has lymphedema in the Left breast and she had therapy for it. Her breast started getting larger and larger since November 2023. It was hot and painful 2 months ago and she was given antibiotics.  Her compression bra does not fit anymore and she has not tried the MLD because it had stopped working, She has pain and itching in the breast. She has a right breast MRI for a concerning spot on Oct 4. She has a swollen left cervical LN.She is also having some left shoulder pain for which she had an injection on 06/14/2023. We are addressing  the breast only. She has not not had any chemo since Dec 2023.  PERTINENT HISTORY:  DM, bipolar disorder, high cholesterol, asthma, history of blood clots, 3 heart blockages, breast cancer treatment completed lumpectomy 07/17/18 on the left with SLNB 0/1 nodes removed. Chemo completed except for 3 sessions and radiation completed all in [place].  Now Stage 4 Breast Cancer .Significant radiation fibrosis. Significant left shoulder pain after attempting MRI. Injected 06/14/23? Had prior treatment for Left breast lymphedema. Havi  PAIN:  Are you having pain? no  PRECAUTIONS: DM, bipolar disorder, high cholesterol, asthma, history of blood clots, 3 heart blockages,  RED FLAGS: Bowel or bladder incontinence: Yes:      WEIGHT BEARING RESTRICTIONS: No  FALLS:  Has patient fallen in last 6 months? Yes. Number of falls 1  LIVING ENVIRONMENT: Lives with: lives with their spouse Lives in: Other RV  OCCUPATION: Not working  LEISURE: gardening, walking, but not presently  HAND DOMINANCE: right   PRIOR LEVEL OF FUNCTION: Independent  PATIENT GOALS: Decrease pain and swelling in left breast   OBJECTIVE:  COGNITION: Overall cognitive status: Within functional limits for tasks assessed   PALPATION: Very firm, tender  OBSERVATIONS / OTHER ASSESSMENTS: Left Breast extremely drawn up, red and firm. Much smaller than right breast. MD notes severe radiation fibrosis. Had prior infection 2 months ago now resolved .  SENSATION: Light touch: Deficits    POSTURE: Forward head, rounded shoulders  UPPER EXTREMITY AROM/PROM:  A/PROM RIGHT   eval   Shoulder extension   Shoulder flexion   Shoulder abduction   Shoulder internal rotation   Shoulder external rotation     (Blank rows = not tested)  A/PROM LEFT   eval  Shoulder extension Pain in left shoulder;recent injection;does not want to treat  Shoulder flexion   Shoulder abduction   Shoulder internal rotation   Shoulder external  rotation     (Blank rows = not tested)  CERVICAL AROM: All within normal limits:      UPPER EXTREMITY STRENGTH:   LYMPHEDEMA ASSESSMENTS:   SURGERY TYPE/DATE: Left Lumpectomy with SLNB 07/17/18  NUMBER OF LYMPH NODES REMOVED: 0/1  CHEMOTHERAPY: YES  RADIATION:YES  HORMONE TREATMENT: NO  INFECTIONS: YES, 2 months ago   LYMPHEDEMA ASSESSMENTS:   LANDMARK RIGHT  eval  At axilla    15 cm proximal to olecranon process   10 cm proximal to olecranon process 29.7  Olecranon process 25.1  15 cm proximal to ulnar styloid process   10 cm proximal to ulnar styloid process 22.7  Just proximal to ulnar styloid process 16.8  Across hand at thumb web space 19.3  At base of 2nd digit 6.3  (Blank  rows = not tested)  LANDMARK LEFT  eval  At axilla    15 cm proximal to olecranon process   10 cm proximal to olecranon process 30  Olecranon process 26  15 cm proximal to ulnar styloid process   10 cm proximal to ulnar styloid process 21.8  Just proximal to ulnar styloid process 16.4  Across hand at thumb web space 19.0  At base of 2nd digit 6.2  (Blank rows = not tested)   FUNCTIONAL TESTS:    GAIT: WNL    QUICK DASH SURVEY: 36%  BREAST COMPLAINTS QUESTIONNAIRE Pain:7 Heaviness:7 Swollen feeling:7 Tense Skin:9 Redness:10 Bra Print:7 Size of Pores:10 Hard feeling: 10 Total:    63 /80 A Score over 9 indicates lymphedema issues in the breast   TODAY'S TREATMENT:      Pt permission and consent throughout each step of examination and treatment with modification and draping if requested when working on sensitive areas. Avoid portacath on right chest                                                                                                                                      DATE:  08/03/2023 Educated pt again in scapular retraction, IR and ER, and extension with yellow band. Had some pain with IR so will hold on that for home. Advised to keep in painfree ROM  for all Continued MLD and massage techniques to left breast to decrease firmness and swelling.. Therapist  perfomed STM to left breast in all directions; pt performed MLD in  supine: Short neck, 5 diaphragmatic breaths, R axillary nodes and establishment of interaxillary pathway, L inguinal nodes and establishment of axilloinguinal pathway, then L breast moving fluid towards pathways spending extra time in any areas of fibrosis in entire breast then retracing all steps and ending with LN's. Pt  practiced all steps with moderate VC's and TC's required for sequence and stretch. Therapist completed MLD with verbalization to pt. While performing. Spaghetti foam trimmed to place in bra  08/01/2023 Wearing jobst pad in compression bra, [place] bra Continued MLD and massage techniques to left breast to decrease firmness and swelling.. Therapist  perfomed MLD  supine: Short neck, 5 diaphragmatic breaths, R axillary nodes and establishment of interaxillary pathway, L inguinal nodes and establishment of axilloinguinal pathway, then L breast moving fluid towards pathways spending extra time in any areas of fibrosis in entire breast then retracing all steps and ending with LN's. Pt also practiced all steps with moderate VC's and TC's required. Therapist completed MLD with verbalization to pt. While performing.  Showed pt scapular retraction with yellow x 10 and left ER with yellow x5. Pt was given exs by shoulder MD and wanted to be shown how to do. Advised to keep in small ROM with good posture and no pain. Gave pt yellow to start with instead of red given by MD  07/27/2023 a  small impression from the spaghetti foam noted on left breast Continued MLD and massage techniques to left breast to decrease firmness and swelling. Educated pt in gentle nature of technique, LN's, pathways and general information about MLD. Therapist performed MLD to the left breast as follows;In supine: Short neck, 5 diaphragmatic breaths, R  axillary nodes and establishment of interaxillary pathway, L inguinal nodes and establishment of axilloinguinal pathway, then L breast moving fluid towards pathways spending extra time in any areas of fibrosis in entire breast then retracing all steps and ending with LN's. Pt also practiced all steps with multiple VC's and TC's required. Therapist completed MLD with verbalization to pt. While performing. Verbally reviewed sequence together again at completion of treatment.   07/25/2023 Pt wearing  another bra that still doesn't enclose breast very well, but there is a small impression from the spaghetti foam. Messaged Dr. Myna Hidalgo to try and get script for compression bra for pt.. Continued MLD and massage techniques to left breast to decrease firmness and swelling. Educated pt in gentle nature of technique, LN's, pathways and general information about MLD. Therapist performed MLD to the left breast as follows;In supine: Short neck, 5 diaphragmatic breaths, R axillary nodes and establishment of interaxillary pathway, L inguinal nodes and establishment of axilloinguinal pathway, then L breast moving fluid towards pathways spending extra time in any areas of fibrosis in entire breast then retracing all steps and ending with LN's. Pt also practiced all steps with multiple VC's and TC's required. Therapist completed MLD with verbalization to pt.  07/20/2023 Spaghetti foam pad (white) made for pts bra to try and soften breast. Pt thought she had a compression bra, but what she is wearing is actually very little compression. She will check at home for another bra. Initiated MLD and massage techniques to left breast to decrease firmness and swelling. Educated pt in gentle nature of technique, LN's, pathways and general information about MLD. Therapist performed MLD to the left breast as follows;In supine: Short neck, 5 diaphragmatic breaths, R axillary nodes and establishment of interaxillary pathway, L inguinal  nodes and establishment of axilloinguinal pathway, then L breast moving fluid towards pathways spending extra time in any areas of fibrosis in entire breast then retracing all steps and ending with LN's Helped pt get foam bra in her bra    EVAL Gave pt name of Second to [facility] to see if her insurance would cover a new bra and if they had any suggestions to try and get compression on the left breast. She was going to call or go by there today    PATIENT EDUCATION:  Education details: POC, goals for rx, treatment Person educated: Patient Education method: Explanation Education comprehension: verbalized understanding  HOME EXERCISE PROGRAM: None given  ASSESSMENT:  CLINICAL IMPRESSION:  Pt has had no left breast pain since wearing compression bra. Breast is slightly softer but not significantly so. Pt still requires VC and TC's for stretch and direction and sequence, but improves with practice. She is pending a breast biopsy but has her PET scan tomorrow OBJECTIVE IMPAIRMENTS: decreased activity tolerance, decreased knowledge of condition, decreased mobility, decreased ROM, increased edema, impaired sensation, postural dysfunction, and pain.   ACTIVITY LIMITATIONS: lifting, sleeping, and reach over head  PARTICIPATION LIMITATIONS:  able to participate in some activities but with pain  PERSONAL FACTORS: 3+ comorbidities: stage IV breast cancer s/p chemo, radiation,DM,Heart problems  are also affecting patient's functional outcome.   REHAB POTENTIAL: Good  CLINICAL DECISION MAKING: Stable/uncomplicated  EVALUATION COMPLEXITY: Low  GOALS: Goals reviewed with patient? Yes  SHORT TERM GOALS= Long TERM GOALS: Target date: 08/18/2023  Pt will be independent with left breast MLD to decrease swelling Baseline: Goal status: INITIAL  2.  Pt will be tolerant of left breast compression to decrease edema Baseline:  Goal status: MET 08/01/2023 (has compression bra nowand pain greatly  improved) 3.  Pt will have decreased left breast pain by 50% or greater Baseline:  Goal status: INITIAL  4.  Breast complaints survey will decrease to 30 or less to demonstrate improvements in left breast swelling/pain Baseline:  Goal status: INITIAL  PLAN:  PT FREQUENCY: 2x/week  PT DURATION: 6 weeks  PLANNED INTERVENTIONS: Therapeutic exercises, Therapeutic activity, Patient/Family education, Self Care, Orthotic/Fit training, Manual lymph drainage, Compression bandaging, Vasopneumatic device, Manual therapy, and Re-evaluation  PLAN FOR NEXT SESSION:Review standing postural exs with band and any others given by shoulder MD, perform MLD and instruct pt,  chip pack or short stretch bandage?  Waynette Buttery, PT 08/03/2023, 10:55 AM

## 2023-08-04 ENCOUNTER — Encounter (HOSPITAL_COMMUNITY)
Admission: RE | Admit: 2023-08-04 | Discharge: 2023-08-04 | Disposition: A | Discharge status: Home / Self Care | Payer: Medicaid Other | Source: Ambulatory Visit | Attending: Oncology | Admitting: Oncology

## 2023-08-04 DIAGNOSIS — C50019 Malignant neoplasm of nipple and areola, unspecified female breast: Secondary | ICD-10-CM | POA: Diagnosis present

## 2023-08-04 DIAGNOSIS — C50011 Malignant neoplasm of nipple and areola, right female breast: Secondary | ICD-10-CM | POA: Insufficient documentation

## 2023-08-04 DIAGNOSIS — Z170 Estrogen receptor positive status [ER+]: Secondary | ICD-10-CM | POA: Insufficient documentation

## 2023-08-04 LAB — GLUCOSE, CAPILLARY: Glucose-Capillary: 180 mg/dL — ABNORMAL HIGH (ref 70–99)

## 2023-08-04 MED ORDER — FLUDEOXYGLUCOSE F - 18 (FDG) INJECTION
9.4200 | Freq: Once | INTRAVENOUS | Status: AC
  Administered 2023-08-04: 9.42 via INTRAVENOUS

## 2023-08-08 ENCOUNTER — Ambulatory Visit: Payer: Medicaid Other

## 2023-08-08 DIAGNOSIS — M25512 Pain in left shoulder: Secondary | ICD-10-CM

## 2023-08-08 DIAGNOSIS — N644 Mastodynia: Secondary | ICD-10-CM

## 2023-08-08 DIAGNOSIS — C50912 Malignant neoplasm of unspecified site of left female breast: Secondary | ICD-10-CM

## 2023-08-08 DIAGNOSIS — I890 Lymphedema, not elsewhere classified: Secondary | ICD-10-CM

## 2023-08-08 NOTE — Therapy (Signed)
OUTPATIENT PHYSICAL THERAPY  UPPER EXTREMITY ONCOLOGY TREATMENT  Patient Name: Ariana Herrera MRN: [id number] DOB:1966-10-15, 57 y.o., female Today's Date: 08/08/2023  END OF SESSION:  PT End of Session - 08/08/23 1101     Visit Number 7    Number of Visits 12    Date for PT Re-Evaluation 08/18/23    PT Start Time 1103    PT Stop Time 1154    PT Time Calculation (min) 51 min    Activity Tolerance Patient tolerated treatment well    Behavior During Therapy WFL for tasks assessed/performed             Past Medical History:  Diagnosis Date   Anginal pain (HCC)    Anxiety    Arthritis    Asthma    Bipolar 1 disorder (HCC)    Bipolar disease, chronic (HCC)    Breast cancer in female (HCC)    2019 and recurrent in 2023 to right breast, right axillary, and left neck   Coronary artery disease    Deep vein blood clot of right lower extremity (HCC)    Diabetes mellitus type 2 in obese    Goals of care, counseling/discussion 11/23/2021   Hyperlipidemia    Lymphedema    MI (myocardial infarction) (HCC)    was in [place]   Obesity    Pancreatitis    Personal history of chemotherapy    Personal history of radiation therapy    Sleep apnea    Past Surgical History:  Procedure Laterality Date   BREAST LUMPECTOMY Left 03/2019   CATARACT EXTRACTION Bilateral    CHOLECYSTECTOMY     IR IMAGING GUIDED PORT INSERTION  12/15/2021   IR REMOVAL TUN ACCESS W/ PORT W/O FL MOD SED  12/20/2019   IR US GUIDE BX ASP/DRAIN  11/02/2021   LEFT HEART CATH AND CORONARY ANGIOGRAPHY N/A 08/22/2020   Procedure: LEFT HEART CATH AND CORONARY ANGIOGRAPHY;  Surgeon: Kathleene Hazel, MD;  Location: [facility];  Service: Cardiovascular;  Laterality: N/A;   TRIGGER FINGER RELEASE Bilateral    x 5   TUBAL LIGATION     UMBILICAL HERNIA REPAIR     x 2   uterine ablation     Patient Active Problem List   Diagnosis Date Noted   Tobacco use 07/14/2023   Type 2 diabetes mellitus  with hyperglycemia, with long-term current use of insulin (HCC) 06/23/2023   Stress incontinence of urine 06/23/2023   Moderate persistent asthma without complication 06/23/2023   Obesity (BMI 30-39.9) 06/23/2023   Chest pain of uncertain etiology    History of MI (myocardial infarction) 08/20/2020   History of pancreatitis 08/20/2020   Hyperlipidemia associated with type 2 diabetes mellitus (HCC) 08/20/2020   Bipolar disease, chronic (HCC)    Breast cancer (HCC) 11/26/2019   Chemotherapy-induced neuropathy (HCC) 11/26/2019   History of DVT (deep vein thrombosis) 11/26/2019      REFERRING PROVIDER: Kennon Holter MD  REFERRING DIAG: Breast Lymphedema s/p left Breast Cancer  THERAPY DIAG:  Malignant neoplasm of left female breast, unspecified estrogen receptor status, unspecified site of breast (HCC)  Acute pain of left shoulder  Breast pain, left  Lymphedema, not elsewhere classified  ONSET DATE: 2 months ago  Rationale for Evaluation and Treatment: Rehabilitation  SUBJECTIVE:  SUBJECTIVE STATEMENT:  08/08/2023 I had my PET scan but no results yet. The bra is doing its job.  I cheated yesterday and didn't wear it in the daytime, but wore it to bed. Its still really hard near the nipple. I tried the MLD and I think it is going well.No pain in breast as long as I wear the compression bra. I tried the shoulder exs but my shoulder hurt a little so I stopped.  Eval Pt has lymphedema in the Left breast and she had therapy for it. Her breast started getting larger and larger since November 2023. It was hot and painful 2 months ago and she was given antibiotics.  Her compression bra does not fit anymore and she has not tried the MLD because it had stopped working, She has pain and itching in the  breast. She has a right breast MRI for a concerning spot on Oct 4. She has a swollen left cervical LN.She is also having some left shoulder pain for which she had an injection on 06/14/2023. We are addressing the breast only. She has not not had any chemo since Dec 2023.  PERTINENT HISTORY:  DM, bipolar disorder, high cholesterol, asthma, history of blood clots, 3 heart blockages, breast cancer treatment completed lumpectomy 07/17/18 on the left with SLNB 0/1 nodes removed. Chemo completed except for 3 sessions and radiation completed all in [place].  Now Stage 4 Breast Cancer .Significant radiation fibrosis. Significant left shoulder pain after attempting MRI. Injected 06/14/23? Had prior treatment for Left breast lymphedema. Havi  PAIN:  Are you having pain? no  PRECAUTIONS: DM, bipolar disorder, high cholesterol, asthma, history of blood clots, 3 heart blockages,  RED FLAGS: Bowel or bladder incontinence: Yes:      WEIGHT BEARING RESTRICTIONS: No  FALLS:  Has patient fallen in last 6 months? Yes. Number of falls 1  LIVING ENVIRONMENT: Lives with: lives with their spouse Lives in: Other RV  OCCUPATION: Not working  LEISURE: gardening, walking, but not presently  HAND DOMINANCE: right   PRIOR LEVEL OF FUNCTION: Independent  PATIENT GOALS: Decrease pain and swelling in left breast   OBJECTIVE:  COGNITION: Overall cognitive status: Within functional limits for tasks assessed   PALPATION: Very firm, tender  OBSERVATIONS / OTHER ASSESSMENTS: Left Breast extremely drawn up, red and firm. Much smaller than right breast. MD notes severe radiation fibrosis. Had prior infection 2 months ago now resolved .  SENSATION: Light touch: Deficits    POSTURE: Forward head, rounded shoulders  UPPER EXTREMITY AROM/PROM:  A/PROM RIGHT   eval   Shoulder extension   Shoulder flexion   Shoulder abduction   Shoulder internal rotation   Shoulder external rotation     (Blank rows =  not tested)  A/PROM LEFT   eval  Shoulder extension Pain in left shoulder;recent injection;does not want to treat  Shoulder flexion   Shoulder abduction   Shoulder internal rotation   Shoulder external rotation     (Blank rows = not tested)  CERVICAL AROM: All within normal limits:      UPPER EXTREMITY STRENGTH:   LYMPHEDEMA ASSESSMENTS:   SURGERY TYPE/DATE: Left Lumpectomy with SLNB 07/17/18  NUMBER OF LYMPH NODES REMOVED: 0/1  CHEMOTHERAPY: YES  RADIATION:YES  HORMONE TREATMENT: NO  INFECTIONS: YES, 2 months ago   LYMPHEDEMA ASSESSMENTS:   LANDMARK RIGHT  eval  At axilla    15 cm proximal to olecranon process   10 cm proximal to olecranon process 29.7  Olecranon process  25.1  15 cm proximal to ulnar styloid process   10 cm proximal to ulnar styloid process 22.7  Just proximal to ulnar styloid process 16.8  Across hand at thumb web space 19.3  At base of 2nd digit 6.3  (Blank rows = not tested)  LANDMARK LEFT  eval  At axilla    15 cm proximal to olecranon process   10 cm proximal to olecranon process 30  Olecranon process 26  15 cm proximal to ulnar styloid process   10 cm proximal to ulnar styloid process 21.8  Just proximal to ulnar styloid process 16.4  Across hand at thumb web space 19.0  At base of 2nd digit 6.2  (Blank rows = not tested)   FUNCTIONAL TESTS:    GAIT: WNL    QUICK DASH SURVEY: 36%  BREAST COMPLAINTS QUESTIONNAIRE Pain:7 Heaviness:7 Swollen feeling:7 Tense Skin:9 Redness:10 Bra Print:7 Size of Pores:10 Hard feeling: 10 Total:    63 /80 A Score over 9 indicates lymphedema issues in the breast   TODAY'S TREATMENT:      Pt permission and consent throughout each step of examination and treatment with modification and draping if requested when working on sensitive areas. Avoid portacath on right chest                                                                                                                                       DATE:  08/08/2023  Continued MLD and massage techniques to left breast to decrease firmness and swelling.. Therapist  perfomed STM to left breast in all directions; pt performed MLD in  supine: Short neck, 5 diaphragmatic breaths, R axillary nodes and establishment of interaxillary pathway, L inguinal nodes and establishment of axilloinguinal pathway, then L breast moving fluid towards pathways spending extra time in any areas of fibrosis in entire breast then retracing all steps and ending with LN's. Pt  practiced all steps with moderate VC's and TC's required for sequence and stretch. Therapist completed MLD with verbalization to pt. While performing. Foam pad cut for right axillary region to prevent bra from rubbing.Reviewed Bilateral scapular retraction with yellow x 10 with VC's and TC's  08/03/2023 Educated pt again in scapular retraction, IR and ER, and extension with yellow band. Had some pain with IR so will hold on that for home. Advised to keep in painfree ROM for all Continued MLD and massage techniques to left breast to decrease firmness and swelling.. Therapist  perfomed STM to left breast in all directions; pt performed MLD in  supine: Short neck, 5 diaphragmatic breaths, R axillary nodes and establishment of interaxillary pathway, L inguinal nodes and establishment of axilloinguinal pathway, then L breast moving fluid towards pathways spending extra time in any areas of fibrosis in entire breast then retracing all steps and ending with LN's. Pt  practiced all steps with moderate VC's and TC's required for sequence and stretch.  Therapist completed MLD with verbalization to pt. While performing. Spaghetti foam trimmed to place in bra  08/01/2023 Wearing jobst pad in compression bra, new [place] bra Continued MLD and massage techniques to left breast to decrease firmness and swelling.. Therapist  perfomed MLD  supine: Short neck, 5 diaphragmatic breaths, R axillary nodes and  establishment of interaxillary pathway, L inguinal nodes and establishment of axilloinguinal pathway, then L breast moving fluid towards pathways spending extra time in any areas of fibrosis in entire breast then retracing all steps and ending with LN's. Pt also practiced all steps with moderate VC's and TC's required. Therapist completed MLD with verbalization to pt. While performing.  Showed pt scapular retraction with yellow x 10 and left ER with yellow x5. Pt was given exs by shoulder MD and wanted to be shown how to do. Advised to keep in small ROM with good posture and no pain. Gave pt yellow to start with instead of red given by MD  07/27/2023 a small impression from the spaghetti foam noted on left breast Continued MLD and massage techniques to left breast to decrease firmness and swelling. Educated pt in gentle nature of technique, LN's, pathways and general information about MLD. Therapist performed MLD to the left breast as follows;In supine: Short neck, 5 diaphragmatic breaths, R axillary nodes and establishment of interaxillary pathway, L inguinal nodes and establishment of axilloinguinal pathway, then L breast moving fluid towards pathways spending extra time in any areas of fibrosis in entire breast then retracing all steps and ending with LN's. Pt also practiced all steps with multiple VC's and TC's required. Therapist completed MLD with verbalization to pt. While performing. Verbally reviewed sequence together again at completion of treatment.   07/25/2023 Pt wearing  another bra that still doesn't enclose breast very well, but there is a small impression from the spaghetti foam. Messaged Dr. Myna Hidalgo to try and get script for compression bra for pt.. Continued MLD and massage techniques to left breast to decrease firmness and swelling. Educated pt in gentle nature of technique, LN's, pathways and general information about MLD. Therapist performed MLD to the left breast as follows;In supine:  Short neck, 5 diaphragmatic breaths, R axillary nodes and establishment of interaxillary pathway, L inguinal nodes and establishment of axilloinguinal pathway, then L breast moving fluid towards pathways spending extra time in any areas of fibrosis in entire breast then retracing all steps and ending with LN's. Pt also practiced all steps with multiple VC's and TC's required. Therapist completed MLD with verbalization to pt.  07/20/2023 Spaghetti foam pad (white) made for pts bra to try and soften breast. Pt thought she had a compression bra, but what she is wearing is actually very little compression. She will check at home for another bra. Initiated MLD and massage techniques to left breast to decrease firmness and swelling. Educated pt in gentle nature of technique, LN's, pathways and general information about MLD. Therapist performed MLD to the left breast as follows;In supine: Short neck, 5 diaphragmatic breaths, R axillary nodes and establishment of interaxillary pathway, L inguinal nodes and establishment of axilloinguinal pathway, then L breast moving fluid towards pathways spending extra time in any areas of fibrosis in entire breast then retracing all steps and ending with LN's Helped pt get foam bra in her bra    EVAL Gave pt name of [facility] to [facility] to see if her insurance would cover a new bra and if they had any suggestions to try and get compression on  the left breast. She was going to call or go by there today    PATIENT EDUCATION:  Education details: POC, goals for rx, treatment Person educated: Patient Education method: Explanation Education comprehension: verbalized understanding  HOME EXERCISE PROGRAM: None given  ASSESSMENT:  CLINICAL IMPRESSION:  Pt has had no left breast pain since wearing compression bra. Breast is slightly softer but not significantly so. Pt still requires VC and TC's for stretch and direction and sequence, but improves with practice. She is pending  a breast biopsy but has her PET scan tomorrow OBJECTIVE IMPAIRMENTS: decreased activity tolerance, decreased knowledge of condition, decreased mobility, decreased ROM, increased edema, impaired sensation, postural dysfunction, and pain.   ACTIVITY LIMITATIONS: lifting, sleeping, and reach over head  PARTICIPATION LIMITATIONS:  able to participate in some activities but with pain  PERSONAL FACTORS: 3+ comorbidities: stage IV breast cancer s/p chemo, radiation,DM,Heart problems  are also affecting patient's functional outcome.   REHAB POTENTIAL: Good  CLINICAL DECISION MAKING: Stable/uncomplicated  EVALUATION COMPLEXITY: Low  GOALS: Goals reviewed with patient? Yes  SHORT TERM GOALS= Long TERM GOALS: Target date: 08/18/2023  Pt will be independent with left breast MLD to decrease swelling Baseline: Goal status: INITIAL  2.  Pt will be tolerant of left breast compression to decrease edema Baseline:  Goal status: MET 08/01/2023 (has compression bra nowand pain greatly improved) 3.  Pt will have decreased left breast pain by 50% or greater Baseline:  Goal status: 08/08/2023 4.  Breast complaints survey will decrease to 30 or less to demonstrate improvements in left breast swelling/pain Baseline:  Goal status: INITIAL  PLAN:  PT FREQUENCY: 2x/week  PT DURATION: 6 weeks  PLANNED INTERVENTIONS: Therapeutic exercises, Therapeutic activity, Patient/Family education, Self Care, Orthotic/Fit training, Manual lymph drainage, Compression bandaging, Vasopneumatic device, Manual therapy, and Re-evaluation  PLAN FOR NEXT SESSION:Review standing postural exs with band and any others given by shoulder MD, perform MLD and instruct pt,  chip pack or short stretch bandage?  Waynette Buttery, PT 08/08/2023, 11:58 AM

## 2023-08-09 ENCOUNTER — Encounter: Payer: Self-pay | Admitting: Physician Assistant

## 2023-08-10 ENCOUNTER — Ambulatory Visit (INDEPENDENT_AMBULATORY_CARE_PROVIDER_SITE_OTHER): Payer: Medicaid Other | Admitting: Physician Assistant

## 2023-08-10 ENCOUNTER — Encounter: Payer: Self-pay | Admitting: Physician Assistant

## 2023-08-10 VITALS — BP 128/80 | HR 115 | Temp 98.1°F | Wt 190.1 lb

## 2023-08-10 DIAGNOSIS — W540XXA Bitten by dog, initial encounter: Secondary | ICD-10-CM

## 2023-08-10 DIAGNOSIS — J069 Acute upper respiratory infection, unspecified: Secondary | ICD-10-CM | POA: Diagnosis not present

## 2023-08-10 LAB — POC COVID19 BINAXNOW: SARS Coronavirus 2 Ag: NEGATIVE

## 2023-08-10 LAB — POCT INFLUENZA A/B
Influenza A, POC: NEGATIVE
Influenza B, POC: NEGATIVE

## 2023-08-10 MED ORDER — DOXYCYCLINE HYCLATE 100 MG PO TABS
100.0000 mg | ORAL_TABLET | Freq: Two times a day (BID) | ORAL | 0 refills | Status: DC
Start: 2023-08-10 — End: 2023-08-25

## 2023-08-10 MED ORDER — BENZONATATE 100 MG PO CAPS
100.0000 mg | ORAL_CAPSULE | Freq: Three times a day (TID) | ORAL | 0 refills | Status: DC | PRN
Start: 2023-08-10 — End: 2023-09-20

## 2023-08-10 NOTE — Progress Notes (Signed)
Established patient visit   Patient: Ariana Herrera   DOB: [birth year]   57 y.o. Female  MRN: [id number] Visit Date: 08/10/2023  Today's healthcare provider: Alfredia Ferguson, PA-C   Chief Complaint  Patient presents with   Flu like symptoms    Cough has been for about a week. No fever. Diarrhea started yesterday. OTC- Dayquil, nyquil, throat spray. No at home test   Subjective    Pt reports a sore throat, cough for the last 1-2 weeks, yesterday with fatigue, n/v, diarrhea. She has been taking immodium, nyquil, dayquil, using a sore throat spray.  Denies fever, sob.   Pt reports a few weeks ago their new puppy nipped her left index finger, and now the skin is breaking down, area is discharging pus.   Medications: Outpatient Medications Prior to Visit  Medication Sig   ACCU-CHEK GUIDE test strip 3 (three) times daily.   albuterol (ACCUNEB) 1.25 MG/3ML nebulizer solution Take 1 ampule by nebulization 4 (four) times daily as needed for wheezing or shortness of breath.   albuterol (VENTOLIN HFA) 108 (90 Base) MCG/ACT inhaler Inhale 2 puffs into the lungs every 6 (six) hours as needed for wheezing or shortness of breath.   Continuous Glucose Receiver (FREESTYLE LIBRE 2 READER) DEVI Use with freestyle libre 2 sensors   Continuous Glucose Sensor (FREESTYLE LIBRE 2 SENSOR) MISC Apply one every 14 days to continuously monitor blood sugar   cyclobenzaprine (FLEXERIL) 5 MG tablet Take 1 tablet (5 mg total) by mouth 2 (two) times daily as needed for muscle spasms.   diclofenac (VOLTAREN) 75 MG EC tablet Take 1 tablet (75 mg total) by mouth 2 (two) times daily.   fenofibrate (TRICOR) 145 MG tablet Take 145 mg by mouth daily.   HYDROcodone-acetaminophen (NORCO) 7.5-325 MG tablet Take 1 tablet by mouth every 6 (six) hours as needed for moderate pain.   insulin aspart (NOVOLOG FLEXPEN) 100 UNIT/ML FlexPen Inject 3 Units into the skin 3 (three) times daily with meals. (Patient taking  differently: Inject 10-16 Units into the skin 3 (three) times daily with meals.)   insulin degludec (TRESIBA FLEXTOUCH) 100 UNIT/ML FlexTouch Pen Inject 25 Units into the skin daily.   insulin glargine (LANTUS) 100 UNIT/ML injection Inject 25 Units into the skin at bedtime.   linaclotide (LINZESS) 290 MCG CAPS capsule Take 1 capsule (290 mcg total) by mouth daily before breakfast.   metFORMIN (GLUCOPHAGE) 1000 MG tablet Take 1,000 mg by mouth 2 (two) times daily with a meal.   nitroGLYCERIN (NITROSTAT) 0.4 MG SL tablet Place 1 tablet (0.4 mg total) under the tongue every 5 (five) minutes as needed for chest pain.   ondansetron (ZOFRAN) 8 MG tablet TAKE 1 TAB BY MOUTH EVERY 8 HOURS AS NEEDED FOR NAUSEA OR VOMITING. START ON THE THIRD DAY AFTER CHEMOTHERAPY.   oxybutynin (DITROPAN-XL) 5 MG 24 hr tablet Take 1 tablet (5 mg total) by mouth at bedtime.   pravastatin (PRAVACHOL) 40 MG tablet Take 1 tablet (40 mg total) by mouth daily.   QUEtiapine (SEROQUEL) 400 MG tablet Take 800 mg by mouth at bedtime.   [DISCONTINUED] ibuprofen (ADVIL) 800 MG tablet Take 1 tablet (800 mg total) by mouth 3 (three) times daily.   [DISCONTINUED] lidocaine (LIDODERM) 5 % Place 1 patch onto the skin daily. Remove & Discard patch within 12 hours or as directed by MD   No facility-administered medications prior to visit.    Review of Systems  Constitutional:  Negative  for fatigue and fever.  HENT:  Positive for congestion, postnasal drip, rhinorrhea and sore throat.   Respiratory:  Positive for cough. Negative for shortness of breath.   Cardiovascular:  Negative for chest pain and leg swelling.  Gastrointestinal:  Positive for diarrhea, nausea and vomiting. Negative for abdominal pain.  Neurological:  Negative for dizziness and headaches.       Objective    BP 128/80   Pulse (!) 115   Temp 98.1 F (36.7 C)   Wt 190 lb 2 oz (86.2 kg)   LMP 10/04/2016 Comment: after "procedure to stop bleeding"  SpO2 97%   BMI  34.77 kg/m    Physical Exam Constitutional:      General: She is awake.     Appearance: She is well-developed.  HENT:     Head: Normocephalic.     Right Ear: Tympanic membrane normal.     Left Ear: Tympanic membrane normal.     Nose: Congestion and rhinorrhea present.     Mouth/Throat:     Pharynx: Posterior oropharyngeal erythema present. No oropharyngeal exudate.  Eyes:     Conjunctiva/sclera: Conjunctivae normal.  Cardiovascular:     Rate and Rhythm: Normal rate and regular rhythm.     Heart sounds: Normal heart sounds.  Pulmonary:     Effort: Pulmonary effort is normal.     Breath sounds: Normal breath sounds.  Skin:    General: Skin is warm.     Comments: Left index finger w/ edema, erythema, ulceration. No discharge today.  Neurological:     Mental Status: She is alert and oriented to person, place, and time.  Psychiatric:        Attention and Perception: Attention normal.        Mood and Affect: Mood normal.        Speech: Speech normal.        Behavior: Behavior is cooperative.     Results for orders placed or performed in visit on 08/10/23  POC COVID-19  Result Value Ref Range   SARS Coronavirus 2 Ag Negative Negative  POCT Influenza A/B  Result Value Ref Range   Influenza A, POC Negative Negative   Influenza B, POC Negative Negative    Assessment & Plan    Upper respiratory tract infection, unspecified type Poc covid/flu negative. Cont otc meds, salt water gargles, hydrate, rest.  Rx tessalon for cough  If no improvement in the next 5-7 days to contact office -     Benzonatate; Take 1 capsule (100 mg total) by mouth 3 (three) times daily as needed for cough.  Dispense: 20 capsule; Refill: 0 -     POC COVID-19 BinaxNow -     POCT Influenza A/B  2. Dog bite, initial encounter - doxycycline (VIBRA-TABS) 100 MG tablet; Take 1 tablet (100 mg total) by mouth 2 (two) times daily.  Dispense: 14 tablet; Refill: 0   Return if symptoms worsen or fail to  improve.       Alfredia Ferguson, PA-C  [facility] [phone] (phone) [phone] (fax)  [facility]

## 2023-08-15 ENCOUNTER — Ambulatory Visit: Payer: Medicaid Other

## 2023-08-15 DIAGNOSIS — M25512 Pain in left shoulder: Secondary | ICD-10-CM

## 2023-08-15 DIAGNOSIS — N644 Mastodynia: Secondary | ICD-10-CM

## 2023-08-15 DIAGNOSIS — C50912 Malignant neoplasm of unspecified site of left female breast: Secondary | ICD-10-CM

## 2023-08-15 DIAGNOSIS — I890 Lymphedema, not elsewhere classified: Secondary | ICD-10-CM

## 2023-08-15 NOTE — Therapy (Signed)
OUTPATIENT PHYSICAL THERAPY  UPPER EXTREMITY ONCOLOGY TREATMENT  Patient Name: Ariana Herrera MRN: [id number] DOB:September 24, 1966, 57 y.o., female Today's Date: 08/15/2023  END OF SESSION:  PT End of Session - 08/15/23 1003     Visit Number 8    Number of Visits 12    Date for PT Re-Evaluation 08/18/23    PT Start Time 1005    PT Stop Time 1053    PT Time Calculation (min) 48 min    Activity Tolerance Patient tolerated treatment well    Behavior During Therapy WFL for tasks assessed/performed             Past Medical History:  Diagnosis Date   Anginal pain (HCC)    Anxiety    Arthritis    Asthma    Bipolar 1 disorder (HCC)    Bipolar disease, chronic (HCC)    Breast cancer in female (HCC)    2019 and recurrent in 2023 to right breast, right axillary, and left neck   Coronary artery disease    Deep vein blood clot of right lower extremity (HCC)    Diabetes mellitus type 2 in obese    Goals of care, counseling/discussion 11/23/2021   Hyperlipidemia    Lymphedema    MI (myocardial infarction) (HCC)    was in [place]   Obesity    Pancreatitis    Personal history of chemotherapy    Personal history of radiation therapy    Sleep apnea    Past Surgical History:  Procedure Laterality Date   BREAST LUMPECTOMY Left 03/2019   CATARACT EXTRACTION Bilateral    CHOLECYSTECTOMY     IR IMAGING GUIDED PORT INSERTION  12/15/2021   IR REMOVAL TUN ACCESS W/ PORT W/O FL MOD SED  12/20/2019   IR US GUIDE BX ASP/DRAIN  11/02/2021   LEFT HEART CATH AND CORONARY ANGIOGRAPHY N/A 08/22/2020   Procedure: LEFT HEART CATH AND CORONARY ANGIOGRAPHY;  Surgeon: Kathleene Hazel, MD;  Location: [facility];  Service: Cardiovascular;  Laterality: N/A;   TRIGGER FINGER RELEASE Bilateral    x 5   TUBAL LIGATION     UMBILICAL HERNIA REPAIR     x 2   uterine ablation     Patient Active Problem List   Diagnosis Date Noted   Tobacco use 07/14/2023   Type 2 diabetes mellitus  with hyperglycemia, with long-term current use of insulin (HCC) 06/23/2023   Stress incontinence of urine 06/23/2023   Moderate persistent asthma without complication 06/23/2023   Obesity (BMI 30-39.9) 06/23/2023   Chest pain of uncertain etiology    History of MI (myocardial infarction) 08/20/2020   History of pancreatitis 08/20/2020   Hyperlipidemia associated with type 2 diabetes mellitus (HCC) 08/20/2020   Bipolar disease, chronic (HCC)    Breast cancer (HCC) 11/26/2019   Chemotherapy-induced neuropathy (HCC) 11/26/2019   History of DVT (deep vein thrombosis) 11/26/2019      REFERRING PROVIDER: Kennon Holter MD  REFERRING DIAG: Breast Lymphedema s/p left Breast Cancer  THERAPY DIAG:  Malignant neoplasm of left female breast, unspecified estrogen receptor status, unspecified site of breast (HCC)  Acute pain of left shoulder  Breast pain, left  Lymphedema, not elsewhere classified  ONSET DATE: 2 months ago  Rationale for Evaluation and Treatment: Rehabilitation  SUBJECTIVE:  SUBJECTIVE STATEMENT:  08/15/2023 I tried the foam pad but it didn't change anything so I am not using it. I had to stop the shoulder exercises because they were causing  pain.  I try to do the MLD 2 times per day. The PET scan came in . They didn't say they saw anything. The MRI said I need to have a biopsy on my breast.   Eval Pt has lymphedema in the Left breast and she had therapy for it. Her breast started getting larger and larger since November 2023. It was hot and painful 2 months ago and she was given antibiotics.  Her compression bra does not fit anymore and she has not tried the MLD because it had stopped working, She has pain and itching in the breast. She has a right breast MRI for a concerning spot on  Oct 4. She has a swollen left cervical LN.She is also having some left shoulder pain for which she had an injection on 06/14/2023. We are addressing the breast only. She has not not had any chemo since Dec 2023.  PERTINENT HISTORY:  DM, bipolar disorder, high cholesterol, asthma, history of blood clots, 3 heart blockages, breast cancer treatment completed lumpectomy 07/17/18 on the left with SLNB 0/1 nodes removed. Chemo completed except for 3 sessions and radiation completed all in [place].  Now Stage 4 Breast Cancer .Significant radiation fibrosis. Significant left shoulder pain after attempting MRI. Injected 06/14/23? Had prior treatment for Left breast lymphedema.   PAIN:  Are you having pain? Not in breast, Shoulder 7/10 PAIN:  Are you having pain? Yes NPRS scale: 7/10 Pain location: left shoulder, with numbness down the arm and into the fingers Pain orientation: Left  PAIN TYPE: aching, sharp, and tight, tingling Pain description: constant  Aggravating factors: reaching, exercissing Relieving factors: laying on left side,norco   PRECAUTIONS: DM, bipolar disorder, high cholesterol, asthma, history of blood clots, 3 heart blockages,  RED FLAGS: Bowel or bladder incontinence: Yes:      WEIGHT BEARING RESTRICTIONS: No  FALLS:  Has patient fallen in last 6 months? Yes. Number of falls 1  LIVING ENVIRONMENT: Lives with: lives with their spouse Lives in: Other RV  OCCUPATION: Not working  LEISURE: gardening, walking, but not presently  HAND DOMINANCE: right   PRIOR LEVEL OF FUNCTION: Independent  PATIENT GOALS: Decrease pain and swelling in left breast   OBJECTIVE:  COGNITION: Overall cognitive status: Within functional limits for tasks assessed   PALPATION: Very firm, tender  OBSERVATIONS / OTHER ASSESSMENTS: Left Breast extremely drawn up, red and firm. Much smaller than right breast. MD notes severe radiation fibrosis. Had prior infection 2 months ago now resolved  .  SENSATION: Light touch: Deficits    POSTURE: Forward head, rounded shoulders  UPPER EXTREMITY AROM/PROM:  A/PROM RIGHT   eval   Shoulder extension   Shoulder flexion   Shoulder abduction   Shoulder internal rotation   Shoulder external rotation     (Blank rows = not tested)  A/PROM LEFT   eval  Shoulder extension Pain in left shoulder;recent injection;does not want to treat  Shoulder flexion   Shoulder abduction   Shoulder internal rotation   Shoulder external rotation     (Blank rows = not tested)  CERVICAL AROM: All within normal limits:      UPPER EXTREMITY STRENGTH:   LYMPHEDEMA ASSESSMENTS:   SURGERY TYPE/DATE: Left Lumpectomy with SLNB 07/17/18  NUMBER OF LYMPH NODES REMOVED: 0/1  CHEMOTHERAPY: YES  RADIATION:YES  HORMONE TREATMENT: NO  INFECTIONS: YES, 2 months ago   LYMPHEDEMA ASSESSMENTS:   LANDMARK RIGHT  eval  At axilla    15 cm proximal to olecranon process   10 cm proximal to olecranon process 29.7  Olecranon process 25.1  15 cm proximal to ulnar styloid process   10 cm proximal to ulnar styloid process 22.7  Just proximal to ulnar styloid process 16.8  Across hand at thumb web space 19.3  At base of 2nd digit 6.3  (Blank rows = not tested)  LANDMARK LEFT  eval  At axilla    15 cm proximal to olecranon process   10 cm proximal to olecranon process 30  Olecranon process 26  15 cm proximal to ulnar styloid process   10 cm proximal to ulnar styloid process 21.8  Just proximal to ulnar styloid process 16.4  Across hand at thumb web space 19.0  At base of 2nd digit 6.2  (Blank rows = not tested)   FUNCTIONAL TESTS:    GAIT: WNL    QUICK DASH SURVEY: 36%  BREAST COMPLAINTS QUESTIONNAIRE Pain:7 Heaviness:7 Swollen feeling:7 Tense Skin:9 Redness:10 Bra Print:7 Size of Pores:10 Hard feeling: 10 Total:    63 /80 A Score over 9 indicates lymphedema issues in the breast   TODAY'S TREATMENT:      Pt permission  and consent throughout each step of examination and treatment with modification and draping if requested when working on sensitive areas. Avoid portacath on right chest                                                                                                                                      DATE:   08/15/2023 Continued MLD and massage techniques to left breast to decrease firmness and swelling.. Therapist  perfomed STM to left breast in all directions;then  performed MLD in  supine: Short neck, 5 diaphragmatic breaths, R axillary nodes and establishment of interaxillary pathway, L inguinal nodes and establishment of axilloinguinal pathway, then L breast moving fluid towards pathways spending extra time in any areas of fibrosis in entire breast then retracing all steps and ending with LN's. Pt  then practiced all steps with intermittent  VC's and TC's required for pathways and lateral breast and hand flat to stretch. Therapist completed MLD with verbalization to pt. While performing. Left lateral neck very tight and tender today. Left gland very swollen however pt had some sickness last week. PET scan was negative. Awaiting appt with MD to see if they will be doing a breast biopsy   08/08/2023  Continued MLD and massage techniques to left breast to decrease firmness and swelling.. Therapist  perfomed STM to left breast in all directions; pt performed MLD in  supine: Short neck, 5 diaphragmatic breaths, R axillary nodes and establishment of interaxillary pathway, L inguinal nodes and establishment of axilloinguinal pathway, then L breast moving fluid towards pathways spending  extra time in any areas of fibrosis in entire breast then retracing all steps and ending with LN's. Pt  practiced all steps with moderate VC's and TC's required for sequence and stretch. Therapist completed MLD with verbalization to pt. While performing. Foam pad cut for right axillary region to prevent bra from  rubbing.Reviewed Bilateral scapular retraction with yellow x 10 with VC's and TC's  08/03/2023 Educated pt again in scapular retraction, IR and ER, and extension with yellow band. Had some pain with IR so will hold on that for home. Advised to keep in painfree ROM for all Continued MLD and massage techniques to left breast to decrease firmness and swelling.. Therapist  perfomed STM to left breast in all directions; pt performed MLD in  supine: Short neck, 5 diaphragmatic breaths, R axillary nodes and establishment of interaxillary pathway, L inguinal nodes and establishment of axilloinguinal pathway, then L breast moving fluid towards pathways spending extra time in any areas of fibrosis in entire breast then retracing all steps and ending with LN's. Pt  practiced all steps with moderate VC's and TC's required for sequence and stretch. Therapist completed MLD with verbalization to pt. While performing. Spaghetti foam trimmed to place in bra  08/01/2023 Wearing jobst pad in compression bra, [place] bra Continued MLD and massage techniques to left breast to decrease firmness and swelling.. Therapist  perfomed MLD  supine: Short neck, 5 diaphragmatic breaths, R axillary nodes and establishment of interaxillary pathway, L inguinal nodes and establishment of axilloinguinal pathway, then L breast moving fluid towards pathways spending extra time in any areas of fibrosis in entire breast then retracing all steps and ending with LN's. Pt also practiced all steps with moderate VC's and TC's required. Therapist completed MLD with verbalization to pt. While performing.  Showed pt scapular retraction with yellow x 10 and left ER with yellow x5. Pt was given exs by shoulder MD and wanted to be shown how to do. Advised to keep in small ROM with good posture and no pain. Gave pt yellow to start with instead of red given by MD  07/27/2023 a small impression from the spaghetti foam noted on left breast Continued MLD  and massage techniques to left breast to decrease firmness and swelling. Educated pt in gentle nature of technique, LN's, pathways and general information about MLD. Therapist performed MLD to the left breast as follows;In supine: Short neck, 5 diaphragmatic breaths, R axillary nodes and establishment of interaxillary pathway, L inguinal nodes and establishment of axilloinguinal pathway, then L breast moving fluid towards pathways spending extra time in any areas of fibrosis in entire breast then retracing all steps and ending with LN's. Pt also practiced all steps with multiple VC's and TC's required. Therapist completed MLD with verbalization to pt. While performing. Verbally reviewed sequence together again at completion of treatment.   07/25/2023 Pt wearing  another bra that still doesn't enclose breast very well, but there is a small impression from the spaghetti foam. Messaged Dr. Myna Hidalgo to try and get script for compression bra for pt.. Continued MLD and massage techniques to left breast to decrease firmness and swelling. Educated pt in gentle nature of technique, LN's, pathways and general information about MLD. Therapist performed MLD to the left breast as follows;In supine: Short neck, 5 diaphragmatic breaths, R axillary nodes and establishment of interaxillary pathway, L inguinal nodes and establishment of axilloinguinal pathway, then L breast moving fluid towards pathways spending extra time in any areas of fibrosis in entire  breast then retracing all steps and ending with LN's. Pt also practiced all steps with multiple VC's and TC's required. Therapist completed MLD with verbalization to pt.  07/20/2023 Spaghetti foam pad (white) made for pts bra to try and soften breast. Pt thought she had a compression bra, but what she is wearing is actually very little compression. She will check at home for another bra. Initiated MLD and massage techniques to left breast to decrease firmness and swelling.  Educated pt in gentle nature of technique, LN's, pathways and general information about MLD. Therapist performed MLD to the left breast as follows;In supine: Short neck, 5 diaphragmatic breaths, R axillary nodes and establishment of interaxillary pathway, L inguinal nodes and establishment of axilloinguinal pathway, then L breast moving fluid towards pathways spending extra time in any areas of fibrosis in entire breast then retracing all steps and ending with LN's Helped pt get foam bra in her bra    EVAL Gave pt name of [facility] to [facility] to see if her insurance would cover a new bra and if they had any suggestions to try and get compression on the left breast. She was going to call or go by there today    PATIENT EDUCATION:  Education details: POC, goals for rx, treatment Person educated: Patient Education method: Explanation Education comprehension: verbalized understanding  HOME EXERCISE PROGRAM: None given  ASSESSMENT:  CLINICAL IMPRESSION:   OBJECTIVE IMPAIRMENTS: decreased activity tolerance, decreased knowledge of condition, decreased mobility, decreased ROM, increased edema, impaired sensation, postural dysfunction, and pain.   ACTIVITY LIMITATIONS: lifting, sleeping, and reach over head  PARTICIPATION LIMITATIONS:  able to participate in some activities but with pain  PERSONAL FACTORS: 3+ comorbidities: stage IV breast cancer s/p chemo, radiation,DM,Heart problems  are also affecting patient's functional outcome.   REHAB POTENTIAL: Good  CLINICAL DECISION MAKING: Stable/uncomplicated  EVALUATION COMPLEXITY: Low  GOALS: Goals reviewed with patient? Yes  SHORT TERM GOALS= Long TERM GOALS: Target date: 08/18/2023  Pt will be independent with left breast MLD to decrease swelling Baseline: Goal status: INITIAL  2.  Pt will be tolerant of left breast compression to decrease edema Baseline:  Goal status: MET 08/01/2023 (has compression bra nowand pain greatly  improved) 3.  Pt will have decreased left breast pain by 50% or greater Baseline:  Goal status: 08/08/2023 4.  Breast complaints survey will decrease to 30 or less to demonstrate improvements in left breast swelling/pain Baseline:  Goal status: INITIAL  PLAN:  PT FREQUENCY: 2x/week  PT DURATION: 6 weeks  PLANNED INTERVENTIONS: Therapeutic exercises, Therapeutic activity, Patient/Family education, Self Care, Orthotic/Fit training, Manual lymph drainage, Compression bandaging, Vasopneumatic device, Manual therapy, and Re-evaluation  PLAN FOR NEXT SESSION RECERT, perform MLD and instruct pt,  chip pack or short stretch bandage?  Waynette Buttery, PT 08/15/2023, 10:53 AM

## 2023-08-17 ENCOUNTER — Ambulatory Visit: Payer: Medicaid Other

## 2023-08-17 DIAGNOSIS — C50912 Malignant neoplasm of unspecified site of left female breast: Secondary | ICD-10-CM | POA: Diagnosis not present

## 2023-08-17 DIAGNOSIS — M25512 Pain in left shoulder: Secondary | ICD-10-CM

## 2023-08-17 DIAGNOSIS — I890 Lymphedema, not elsewhere classified: Secondary | ICD-10-CM

## 2023-08-17 DIAGNOSIS — N644 Mastodynia: Secondary | ICD-10-CM

## 2023-08-17 NOTE — Therapy (Signed)
OUTPATIENT PHYSICAL THERAPY  UPPER EXTREMITY ONCOLOGY TREATMENT  Patient Name: Ariana Herrera MRN: [id number] DOB:1966-06-27, 57 y.o., female Today's Date: 08/17/2023  END OF SESSION:  PT End of Session - 08/17/23 1004     Visit Number 9    Number of Visits 12    Date for PT Re-Evaluation 08/18/23    PT Start Time 1005    PT Stop Time 1051    PT Time Calculation (min) 46 min    Activity Tolerance Patient tolerated treatment well    Behavior During Therapy WFL for tasks assessed/performed             Past Medical History:  Diagnosis Date   Anginal pain (HCC)    Anxiety    Arthritis    Asthma    Bipolar 1 disorder (HCC)    Bipolar disease, chronic (HCC)    Breast cancer in female (HCC)    2019 and recurrent in 2023 to right breast, right axillary, and left neck   Coronary artery disease    Deep vein blood clot of right lower extremity (HCC)    Diabetes mellitus type 2 in obese    Goals of care, counseling/discussion 11/23/2021   Hyperlipidemia    Lymphedema    MI (myocardial infarction) (HCC)    was in [place]   Obesity    Pancreatitis    Personal history of chemotherapy    Personal history of radiation therapy    Sleep apnea    Past Surgical History:  Procedure Laterality Date   BREAST LUMPECTOMY Left 03/2019   CATARACT EXTRACTION Bilateral    CHOLECYSTECTOMY     IR IMAGING GUIDED PORT INSERTION  12/15/2021   IR REMOVAL TUN ACCESS W/ PORT W/O FL MOD SED  12/20/2019   IR US GUIDE BX ASP/DRAIN  11/02/2021   LEFT HEART CATH AND CORONARY ANGIOGRAPHY N/A 08/22/2020   Procedure: LEFT HEART CATH AND CORONARY ANGIOGRAPHY;  Surgeon: Kathleene Hazel, MD;  Location: [facility];  Service: Cardiovascular;  Laterality: N/A;   TRIGGER FINGER RELEASE Bilateral    x 5   TUBAL LIGATION     UMBILICAL HERNIA REPAIR     x 2   uterine ablation     Patient Active Problem List   Diagnosis Date Noted   Tobacco use 07/14/2023   Type 2 diabetes mellitus  with hyperglycemia, with long-term current use of insulin (HCC) 06/23/2023   Stress incontinence of urine 06/23/2023   Moderate persistent asthma without complication 06/23/2023   Obesity (BMI 30-39.9) 06/23/2023   Chest pain of uncertain etiology    History of MI (myocardial infarction) 08/20/2020   History of pancreatitis 08/20/2020   Hyperlipidemia associated with type 2 diabetes mellitus (HCC) 08/20/2020   Bipolar disease, chronic (HCC)    Breast cancer (HCC) 11/26/2019   Chemotherapy-induced neuropathy (HCC) 11/26/2019   History of DVT (deep vein thrombosis) 11/26/2019      REFERRING PROVIDER: Kennon Holter MD  REFERRING DIAG: Breast Lymphedema s/p left Breast Cancer  THERAPY DIAG:  Malignant neoplasm of left female breast, unspecified estrogen receptor status, unspecified site of breast (HCC)  Acute pain of left shoulder  Breast pain, left  Lymphedema, not elsewhere classified  ONSET DATE: 2 months ago  Rationale for Evaluation and Treatment: Rehabilitation  SUBJECTIVE:  SUBJECTIVE STATEMENT:  08/17/2023 I feel like I am getting the MLD down. I am not having any more breast pain, and I continue to wear the compression bra day and night. My breast feels a little softer as well. It is still a little tender at the nipple where it is harder.  Eval Pt has lymphedema in the Left breast and she had therapy for it. Her breast started getting larger and larger since November 2023. It was hot and painful 2 months ago and she was given antibiotics.  Her compression bra does not fit anymore and she has not tried the MLD because it had stopped working, She has pain and itching in the breast. She has a right breast MRI for a concerning spot on Oct 4. She has a swollen left cervical LN.She is also  having some left shoulder pain for which she had an injection on 06/14/2023. We are addressing the breast only. She has not not had any chemo since Dec 2023.  PERTINENT HISTORY:  DM, bipolar disorder, high cholesterol, asthma, history of blood clots, 3 heart blockages, breast cancer treatment completed lumpectomy 07/17/18 on the left with SLNB 0/1 nodes removed. Chemo completed except for 3 sessions and radiation completed all in [place].  Now Stage 4 Breast Cancer .Significant radiation fibrosis. Significant left shoulder pain after attempting MRI. Injected 06/14/23? Had prior treatment for Left breast lymphedema.   PAIN:  Are you having pain? Not in breast, Shoulder 7/10 PAIN:  Are you having pain? Yes NPRS scale: 7/10 Pain location: left shoulder, with numbness down the arm and into the fingers Pain orientation: Left  PAIN TYPE: aching, sharp, and tight, tingling Pain description: constant  Aggravating factors: reaching, exercissing Relieving factors: laying on left side,norco   PRECAUTIONS: DM, bipolar disorder, high cholesterol, asthma, history of blood clots, 3 heart blockages,  RED FLAGS: Bowel or bladder incontinence: Yes:      WEIGHT BEARING RESTRICTIONS: No  FALLS:  Has patient fallen in last 6 months? Yes. Number of falls 1  LIVING ENVIRONMENT: Lives with: lives with their spouse Lives in: Other RV  OCCUPATION: Not working  LEISURE: gardening, walking, but not presently  HAND DOMINANCE: right   PRIOR LEVEL OF FUNCTION: Independent  PATIENT GOALS: Decrease pain and swelling in left breast   OBJECTIVE:  COGNITION: Overall cognitive status: Within functional limits for tasks assessed   PALPATION: Very firm, tender  OBSERVATIONS / OTHER ASSESSMENTS: Left Breast extremely drawn up, red and firm. Much smaller than right breast. MD notes severe radiation fibrosis. Had prior infection 2 months ago now resolved .  SENSATION: Light touch: Deficits    POSTURE:  Forward head, rounded shoulders  UPPER EXTREMITY AROM/PROM:  A/PROM RIGHT   eval   Shoulder extension   Shoulder flexion   Shoulder abduction   Shoulder internal rotation   Shoulder external rotation     (Blank rows = not tested)  A/PROM LEFT   eval  Shoulder extension Pain in left shoulder;recent injection;does not want to treat  Shoulder flexion   Shoulder abduction   Shoulder internal rotation   Shoulder external rotation     (Blank rows = not tested)  CERVICAL AROM: All within normal limits:      UPPER EXTREMITY STRENGTH:   LYMPHEDEMA ASSESSMENTS:   SURGERY TYPE/DATE: Left Lumpectomy with SLNB 07/17/18  NUMBER OF LYMPH NODES REMOVED: 0/1  CHEMOTHERAPY: YES  RADIATION:YES  HORMONE TREATMENT: NO  INFECTIONS: YES, 2 months ago   LYMPHEDEMA ASSESSMENTS:  LANDMARK RIGHT  eval  At axilla    15 cm proximal to olecranon process   10 cm proximal to olecranon process 29.7  Olecranon process 25.1  15 cm proximal to ulnar styloid process   10 cm proximal to ulnar styloid process 22.7  Just proximal to ulnar styloid process 16.8  Across hand at thumb web space 19.3  At base of 2nd digit 6.3  (Blank rows = not tested)  LANDMARK LEFT  eval  At axilla    15 cm proximal to olecranon process   10 cm proximal to olecranon process 30  Olecranon process 26  15 cm proximal to ulnar styloid process   10 cm proximal to ulnar styloid process 21.8  Just proximal to ulnar styloid process 16.4  Across hand at thumb web space 19.0  At base of 2nd digit 6.2  (Blank rows = not tested)   FUNCTIONAL TESTS:    GAIT: WNL    QUICK DASH SURVEY: 36%  BREAST COMPLAINTS QUESTIONNAIRE Pain:7 Heaviness:7 Swollen feeling:7 Tense Skin:9 Redness:10 Bra Print:7 Size of Pores:10 Hard feeling: 10 Total:    63 /80 A Score over 9 indicates lymphedema issues in the breast    BREAST COMPLAINTS QUESTIONNAIRE (08/17/2023) Pain:2 Heaviness:2 Swollen feeling:8 Tense  Skin:3 Redness:7 Bra Print:4 Size of Pores:2 Hard feeling: 8 Total:   36 /80 A Score over 9 indicates lymphedema issues in the breast    TODAY'S TREATMENT:      Pt permission and consent throughout each step of examination and treatment with modification and draping if requested when working on sensitive areas. Avoid portacath on right chest                                                                                                                                      DATE:   08/17/2023  Continued MLD and massage techniques to left breast to decrease firmness and swelling.Marland Kitchen Pt perfomed STM to left breast in all directions;then  performed MLD in  supine: Short neck, 5 diaphragmatic breaths, R axillary nodes and establishment of interaxillary pathway, L inguinal nodes and establishment of axilloinguinal pathway, then L breast moving fluid towards pathways spending extra time in any areas of fibrosis in entire breast then retracing all steps and ending with LN's. Pt  performed all steps with occasional  VC's and TC's required for pathways and hand flat to stretch. Therapist completed MLD with verbalization to pt. While performing. Reviewed warning signs of infection and to discontinue MLD if having any of those signs and see MD immediately .  08/15/2023 Continued MLD and massage techniques to left breast to decrease firmness and swelling.. Therapist  perfomed STM to left breast in all directions;then  performed MLD in  supine: Short neck, 5 diaphragmatic breaths, R axillary nodes and establishment of interaxillary pathway, L inguinal nodes and establishment of axilloinguinal pathway, then L breast moving fluid towards pathways spending  extra time in any areas of fibrosis in entire breast then retracing all steps and ending with LN's. Pt  then practiced all steps with intermittent  VC's and TC's required for pathways and lateral breast and hand flat to stretch. Therapist completed MLD with  verbalization to pt. While performing. Left lateral neck very tight and tender today. Left gland very swollen however pt had some sickness last week. PET scan was negative. Awaiting appt with MD to see if they will be doing a breast biopsy   08/08/2023  Continued MLD and massage techniques to left breast to decrease firmness and swelling.. Therapist  perfomed STM to left breast in all directions; pt performed MLD in  supine: Short neck, 5 diaphragmatic breaths, R axillary nodes and establishment of interaxillary pathway, L inguinal nodes and establishment of axilloinguinal pathway, then L breast moving fluid towards pathways spending extra time in any areas of fibrosis in entire breast then retracing all steps and ending with LN's. Pt  practiced all steps with moderate VC's and TC's required for sequence and stretch. Therapist completed MLD with verbalization to pt. While performing. Foam pad cut for right axillary region to prevent bra from rubbing.Reviewed Bilateral scapular retraction with yellow x 10 with VC's and TC's  08/03/2023 Educated pt again in scapular retraction, IR and ER, and extension with yellow band. Had some pain with IR so will hold on that for home. Advised to keep in painfree ROM for all Continued MLD and massage techniques to left breast to decrease firmness and swelling.. Therapist  perfomed STM to left breast in all directions; pt performed MLD in  supine: Short neck, 5 diaphragmatic breaths, R axillary nodes and establishment of interaxillary pathway, L inguinal nodes and establishment of axilloinguinal pathway, then L breast moving fluid towards pathways spending extra time in any areas of fibrosis in entire breast then retracing all steps and ending with LN's. Pt  practiced all steps with moderate VC's and TC's required for sequence and stretch. Therapist completed MLD with verbalization to pt. While performing. Spaghetti foam trimmed to place in bra  08/01/2023 Wearing jobst  pad in compression bra, [place] bra Continued MLD and massage techniques to left breast to decrease firmness and swelling.. Therapist  perfomed MLD  supine: Short neck, 5 diaphragmatic breaths, R axillary nodes and establishment of interaxillary pathway, L inguinal nodes and establishment of axilloinguinal pathway, then L breast moving fluid towards pathways spending extra time in any areas of fibrosis in entire breast then retracing all steps and ending with LN's. Pt also practiced all steps with moderate VC's and TC's required. Therapist completed MLD with verbalization to pt. While performing.  Showed pt scapular retraction with yellow x 10 and left ER with yellow x5. Pt was given exs by shoulder MD and wanted to be shown how to do. Advised to keep in small ROM with good posture and no pain. Gave pt yellow to start with instead of red given by MD  07/27/2023 a small impression from the spaghetti foam noted on left breast Continued MLD and massage techniques to left breast to decrease firmness and swelling. Educated pt in gentle nature of technique, LN's, pathways and general information about MLD. Therapist performed MLD to the left breast as follows;In supine: Short neck, 5 diaphragmatic breaths, R axillary nodes and establishment of interaxillary pathway, L inguinal nodes and establishment of axilloinguinal pathway, then L breast moving fluid towards pathways spending extra time in any areas of fibrosis in entire breast then  retracing all steps and ending with LN's. Pt also practiced all steps with multiple VC's and TC's required. Therapist completed MLD with verbalization to pt. While performing. Verbally reviewed sequence together again at completion of treatment.   07/25/2023 Pt wearing  another bra that still doesn't enclose breast very well, but there is a small impression from the spaghetti foam. Messaged Dr. Myna Hidalgo to try and get script for compression bra for pt.. Continued MLD and massage  techniques to left breast to decrease firmness and swelling. Educated pt in gentle nature of technique, LN's, pathways and general information about MLD. Therapist performed MLD to the left breast as follows;In supine: Short neck, 5 diaphragmatic breaths, R axillary nodes and establishment of interaxillary pathway, L inguinal nodes and establishment of axilloinguinal pathway, then L breast moving fluid towards pathways spending extra time in any areas of fibrosis in entire breast then retracing all steps and ending with LN's. Pt also practiced all steps with multiple VC's and TC's required. Therapist completed MLD with verbalization to pt.  07/20/2023 Spaghetti foam pad (white) made for pts bra to try and soften breast. Pt thought she had a compression bra, but what she is wearing is actually very little compression. She will check at home for another bra. Initiated MLD and massage techniques to left breast to decrease firmness and swelling. Educated pt in gentle nature of technique, LN's, pathways and general information about MLD. Therapist performed MLD to the left breast as follows;In supine: Short neck, 5 diaphragmatic breaths, R axillary nodes and establishment of interaxillary pathway, L inguinal nodes and establishment of axilloinguinal pathway, then L breast moving fluid towards pathways spending extra time in any areas of fibrosis in entire breast then retracing all steps and ending with LN's Helped pt get foam bra in her bra    EVAL Gave pt name of [facility] to [facility] to see if her insurance would cover a new bra and if they had any suggestions to try and get compression on the left breast. She was going to call or go by there today    PATIENT EDUCATION:  Education details: POC, goals for rx, treatment Person educated: Patient Education method: Explanation Education comprehension: verbalized understanding  HOME EXERCISE PROGRAM: None given  ASSESSMENT:  CLINICAL IMPRESSION: Pt has  achieved 3/4 goals established at evaluation. She is compliant with her compression bra and is no longer having left breast pain. The breast has also softened throughout, but is still very firm. She was able to return demonstrate self MLD today with improved form. She has not achieved Breast Complaints survey goal but has made good improvement from 63/80 at evaluation decreased to 36/80 at discharge. Continued Compression will be important. Pt feels comfortable with Discharge and will continue to work with her MLD at home. She was advised to contact us with any questions or concerns.   OBJECTIVE IMPAIRMENTS: decreased activity tolerance, decreased knowledge of condition, decreased mobility, decreased ROM, increased edema, impaired sensation, postural dysfunction, and pain.   ACTIVITY LIMITATIONS: lifting, sleeping, and reach over head  PARTICIPATION LIMITATIONS:  able to participate in some activities but with pain  PERSONAL FACTORS: 3+ comorbidities: stage IV breast cancer s/p chemo, radiation,DM,Heart problems  are also affecting patient's functional outcome.   REHAB POTENTIAL: Good  CLINICAL DECISION MAKING: Stable/uncomplicated  EVALUATION COMPLEXITY: Low  GOALS: Goals reviewed with patient? Yes  SHORT TERM GOALS= Long TERM GOALS: Target date: 08/18/2023  Pt will be independent with left breast MLD to decrease swelling Baseline: Goal  status: MET 08/17/2023  2.  Pt will be tolerant of left breast compression to decrease edema Baseline:  Goal status: MET 08/01/2023 (has compression bra nowand pain greatly improved) 3.  Pt will have decreased left breast pain by 50% or greater Baseline:  Goal status: MET 08/08/2023 4.  Breast complaints survey will decrease to 30 or less to demonstrate improvements in left breast swelling/pain Baseline:  Goal status: NOT MET PLAN:  PT FREQUENCY: 2x/week  PT DURATION: 6 weeks  PLANNED INTERVENTIONS: Therapeutic exercises, Therapeutic activity,  Patient/Family education, Self Care, Orthotic/Fit training, Manual lymph drainage, Compression bandaging, Vasopneumatic device, Manual therapy, and Re-evaluation  PLAN FOR NEXT SESSION : pt discharged to continue independent self management  PHYSICAL THERAPY DISCHARGE SUMMARY  Visits from Start of Care: 9  Current functional level related to goals / functional outcomes: Achieved all goals except breast complaints survey goal   Remaining deficits: Continued firmness/discoloration in breast but no longer having pain and has softened some.   Education / Equipment: MLD, warning signs of infection, pt has compression bra   Patient agrees to discharge. Patient goals were partially met. Patient is being discharged due to being pleased with the current functional level.    Waynette Buttery, PT 08/17/2023, 1:06 PM

## 2023-08-22 ENCOUNTER — Ambulatory Visit: Payer: Medicaid Other | Admitting: Internal Medicine

## 2023-08-24 ENCOUNTER — Other Ambulatory Visit (HOSPITAL_BASED_OUTPATIENT_CLINIC_OR_DEPARTMENT_OTHER): Payer: Self-pay

## 2023-08-24 ENCOUNTER — Other Ambulatory Visit: Payer: Self-pay | Admitting: Hematology & Oncology

## 2023-08-24 DIAGNOSIS — M25512 Pain in left shoulder: Secondary | ICD-10-CM

## 2023-08-24 DIAGNOSIS — C50011 Malignant neoplasm of nipple and areola, right female breast: Secondary | ICD-10-CM

## 2023-08-24 MED ORDER — HYDROCODONE-ACETAMINOPHEN 7.5-325 MG PO TABS
1.0000 | ORAL_TABLET | Freq: Four times a day (QID) | ORAL | 0 refills | Status: DC | PRN
  Filled 2023-08-24: qty 120, 30d supply, fill #0

## 2023-08-25 ENCOUNTER — Inpatient Hospital Stay: Payer: Medicaid Other

## 2023-08-25 ENCOUNTER — Other Ambulatory Visit: Payer: Self-pay | Admitting: *Deleted

## 2023-08-25 ENCOUNTER — Other Ambulatory Visit (HOSPITAL_BASED_OUTPATIENT_CLINIC_OR_DEPARTMENT_OTHER): Payer: Self-pay

## 2023-08-25 ENCOUNTER — Inpatient Hospital Stay: Payer: Medicaid Other | Admitting: Hematology & Oncology

## 2023-08-25 ENCOUNTER — Encounter: Payer: Self-pay | Admitting: Hematology & Oncology

## 2023-08-25 ENCOUNTER — Inpatient Hospital Stay: Payer: Medicaid Other | Attending: Hematology & Oncology

## 2023-08-25 VITALS — BP 152/72 | HR 117 | Temp 98.4°F | Resp 20 | Ht 62.0 in | Wt 185.0 lb

## 2023-08-25 DIAGNOSIS — R234 Changes in skin texture: Secondary | ICD-10-CM | POA: Diagnosis not present

## 2023-08-25 DIAGNOSIS — C50011 Malignant neoplasm of nipple and areola, right female breast: Secondary | ICD-10-CM | POA: Diagnosis not present

## 2023-08-25 DIAGNOSIS — C50019 Malignant neoplasm of nipple and areola, unspecified female breast: Secondary | ICD-10-CM

## 2023-08-25 DIAGNOSIS — C50012 Malignant neoplasm of nipple and areola, left female breast: Secondary | ICD-10-CM

## 2023-08-25 DIAGNOSIS — Z853 Personal history of malignant neoplasm of breast: Secondary | ICD-10-CM | POA: Diagnosis not present

## 2023-08-25 DIAGNOSIS — E1165 Type 2 diabetes mellitus with hyperglycemia: Secondary | ICD-10-CM

## 2023-08-25 DIAGNOSIS — Z79899 Other long term (current) drug therapy: Secondary | ICD-10-CM | POA: Insufficient documentation

## 2023-08-25 DIAGNOSIS — N644 Mastodynia: Secondary | ICD-10-CM | POA: Diagnosis present

## 2023-08-25 DIAGNOSIS — Z794 Long term (current) use of insulin: Secondary | ICD-10-CM

## 2023-08-25 LAB — CBC WITH DIFFERENTIAL (CANCER CENTER ONLY)
Abs Immature Granulocytes: 0.05 10*3/uL (ref 0.00–0.07)
Basophils Absolute: 0 10*3/uL (ref 0.0–0.1)
Basophils Relative: 0 %
Eosinophils Absolute: 0 10*3/uL (ref 0.0–0.5)
Eosinophils Relative: 0 %
HCT: 36.5 % (ref 36.0–46.0)
Hemoglobin: 12.6 g/dL (ref 12.0–15.0)
Immature Granulocytes: 1 %
Lymphocytes Relative: 26 %
Lymphs Abs: 1.8 10*3/uL (ref 0.7–4.0)
MCH: 30.1 pg (ref 26.0–34.0)
MCHC: 34.5 g/dL (ref 30.0–36.0)
MCV: 87.3 fL (ref 80.0–100.0)
Monocytes Absolute: 0.5 10*3/uL (ref 0.1–1.0)
Monocytes Relative: 7 %
Neutro Abs: 4.6 10*3/uL (ref 1.7–7.7)
Neutrophils Relative %: 66 %
Platelet Count: 280 10*3/uL (ref 150–400)
RBC: 4.18 MIL/uL (ref 3.87–5.11)
RDW: 14 % (ref 11.5–15.5)
WBC Count: 7 10*3/uL (ref 4.0–10.5)
nRBC: 0 % (ref 0.0–0.2)

## 2023-08-25 LAB — COMPREHENSIVE METABOLIC PANEL
ALT: 28 U/L (ref 0–44)
AST: 27 U/L (ref 15–41)
Albumin: 4.3 g/dL (ref 3.5–5.0)
Alkaline Phosphatase: 67 U/L (ref 38–126)
Anion gap: 9 (ref 5–15)
BUN: 15 mg/dL (ref 6–20)
CO2: 24 mmol/L (ref 22–32)
Calcium: 9.5 mg/dL (ref 8.9–10.3)
Chloride: 102 mmol/L (ref 98–111)
Creatinine, Ser: 0.67 mg/dL (ref 0.44–1.00)
GFR, Estimated: >60 mL/min (ref >60)
Glucose, Bld: 277 mg/dL — ABNORMAL HIGH (ref 70–99)
Potassium: 4 mmol/L (ref 3.5–5.1)
Sodium: 135 mmol/L (ref 135–145)
Total Bilirubin: 0.3 mg/dL (ref 0.3–1.2)
Total Protein: 7.1 g/dL (ref 6.5–8.1)

## 2023-08-25 LAB — TSH: TSH: 2.37 u[IU]/mL (ref 0.350–4.500)

## 2023-08-25 LAB — FERRITIN: Ferritin: 33 ng/mL (ref 11–307)

## 2023-08-25 MED ORDER — ALTEPLASE 2 MG IJ SOLR
2.0000 mg | Freq: Once | INTRAMUSCULAR | Status: AC | PRN
  Administered 2023-08-25: 2 mg
  Filled 2023-08-25: qty 2

## 2023-08-25 MED ORDER — CEPHALEXIN 500 MG PO CAPS
500.0000 mg | ORAL_CAPSULE | Freq: Three times a day (TID) | ORAL | 0 refills | Status: DC
  Filled 2023-08-25: qty 30, 10d supply, fill #0

## 2023-08-25 MED ORDER — HEPARIN SOD (PORK) LOCK FLUSH 100 UNIT/ML IV SOLN
500.0000 [IU] | Freq: Once | INTRAVENOUS | Status: AC
  Administered 2023-08-25: 500 [IU] via INTRAVENOUS

## 2023-08-25 MED ORDER — SODIUM CHLORIDE 0.9% FLUSH
10.0000 mL | INTRAVENOUS | Status: AC | PRN
  Administered 2023-08-25: 10 mL via INTRAVENOUS

## 2023-08-25 NOTE — Progress Notes (Unsigned)
Port-A-Cath  accessed per policy. Site flushes well with no pain or swelling noted or stated.able to flush without swelling. No blood return noted. Cath-Flo instilled per policy. Site marked.

## 2023-08-25 NOTE — Progress Notes (Unsigned)
Tried to collect blood from the port at 1545 with no success. 1601 - no blood return

## 2023-08-25 NOTE — Progress Notes (Signed)
BP remains elevated, 152/72, is going to see her cardiologist next week for her 3 month eval. Instructed to monitor at home and keep diary and give to MD.  Verbalized understanding.

## 2023-08-25 NOTE — Patient Instructions (Signed)

## 2023-08-25 NOTE — Progress Notes (Signed)
Hematology and Oncology Follow Up Visit  Ariana Herrera [id number] Dec 13, 1965 57 y.o. 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  She is still having issues with the left breast.  There is still a lot of pain in the left breast.  It is quite firm.  She did have a MRI of the breast bilaterally.  This was done on 07/29/2023.  The MRI showed that there is nothing in the right breast that looked suspicious.  The left breast there is a shrunken appearance with diffuse skin thickening and enhancement and will recur worrisome for recurrent disease.  She really would like to have the breast taken off.  I am not sure as why we cannot have the breast taken off.  Hopefully, this will be able to be done.  I think we may have to see about a biopsy of the left breast.  I talked to Ariana Herrera about this.  She would be willing to do this..  She did have a PET scan that was done.  This was done on 08/04/2023.  PET scan was negative for any obvious static disease.  There is nothing with the left breast that looked unusual.  It felt that this was radiation changes in the breast.  She is complaining of some swelling in the left neck.  This been going on for several weeks.  Again, the PET scan that was done couple weeks ago was unremarkable.  She did have a ultrasound of the neck that was done in September which was unremarkable..  I did go ahead and give her in as an antibiotic with Keflex to take.  Her last CA 27.29 was elevated at 56.  This is a little bit troublesome.  Of note she is complaining of some  neurological issues.  I think it might be reasonable to do a MRI of the brain to make sure there is nothing going on that we are not picking up.  Again, her main concern is the pain with the left breast.  Is quite firm and shrinking.  Again this is quite painful for her.  Again be nice to see if this cannot be taken off.  She is still smoking.  She is trying to cut back but she finds this very difficult.  She has had no change in bowel or bladder habits.  She has had no cough.  There has been no dysphagia or odynophagia.  Overall, I would say performance status is probably ECOG 1.     Medications:  Allergies as of 08/25/2023       Reactions   Lithium Other (See Comments)   Spinal fluid built up in brain   Dulaglutide Nausea And Vomiting, Other (See Comments)   Penicillins Other (See Comments)   UNKNOWN CHILDHOOD REACTION        Medication List        Accurate as of August 25, 2023  4:51 PM. If you have any questions, ask your nurse or doctor.          STOP taking these medications    doxycycline 100  MG tablet Commonly known as: VIBRA-TABS Stopped by: [name]   FreeStyle Libre 2 Reader Vonna Drafts by: [name]   FreeStyle Libre 2 Sensor Misc Stopped by: [name]   insulin glargine 100 UNIT/ML injection Commonly known as: LANTUS Stopped by: [name]       TAKE these medications    Accu-Chek Guide test strip Generic drug: glucose blood 3 (three) times daily.   albuterol 1.25 MG/3ML nebulizer solution Commonly known as: ACCUNEB Take 1 ampule by nebulization 4 (four) times daily as needed for wheezing or shortness of breath.   albuterol 108 (90 Base) MCG/ACT inhaler Commonly known as: VENTOLIN HFA Inhale 2 puffs into the lungs every 6 (six) hours as needed for wheezing or shortness of breath.   benzonatate 100 MG capsule Commonly known as: Tessalon Perles Take 1 capsule (100 mg total) by mouth 3 (three) times daily as  needed for cough.   cephALEXin 500 MG capsule Commonly known as: KEFLEX Take 1 capsule (500 mg total) by mouth 3 (three) times daily. Started by: [name]   cyclobenzaprine 5 MG tablet Commonly known as: FLEXERIL Take 1 tablet (5 mg total) by mouth 2 (two) times daily as needed for muscle spasms.   diclofenac 75 MG EC tablet Commonly known as: VOLTAREN Take 1 tablet (75 mg total) by mouth 2 (two) times daily.   fenofibrate 145 MG tablet Commonly known as: TRICOR Take 145 mg by mouth daily.   HYDROcodone-acetaminophen 7.5-325 MG tablet Commonly known as: Norco Take 1 tablet by mouth every 6 (six) hours as needed for moderate pain (pain score 4-6).   linaclotide 290 MCG Caps capsule Commonly known as: Linzess Take 1 capsule (290 mcg total) by mouth daily before breakfast.   loperamide 2 MG capsule Commonly known as: IMODIUM 2 mg as needed.   metFORMIN 1000 MG tablet Commonly known as: GLUCOPHAGE Take 1,000 mg by mouth 2 (two) times daily with a meal.   nitroGLYCERIN 0.4 MG SL tablet Commonly known as: NITROSTAT Place 1 tablet (0.4 mg total) under the tongue every 5 (five) minutes as needed for chest pain.   NovoLOG FlexPen 100 UNIT/ML FlexPen Generic drug: insulin aspart Inject 3 Units into the skin 3 (three) times daily with meals. What changed: how much to take   omeprazole 40 MG capsule Commonly known as: PRILOSEC Take 40 mg by mouth 2 (two) times daily.   ondansetron 8 MG tablet Commonly known as: ZOFRAN TAKE 1 TAB BY MOUTH EVERY 8 HOURS AS NEEDED FOR NAUSEA OR VOMITING. START ON THE THIRD DAY AFTER CHEMOTHERAPY.   oxybutynin 5 MG 24 hr tablet Commonly known as: DITROPAN-XL Take 1 tablet (5 mg total) by mouth at bedtime.   pravastatin 40 MG tablet Commonly known as: PRAVACHOL Take 1 tablet (40 mg total) by mouth daily.   QUEtiapine 400 MG tablet Commonly known as: SEROQUEL Take 800 mg by mouth at bedtime.   Evaristo Bury FlexTouch 100 UNIT/ML  FlexTouch Pen Generic drug: insulin degludec Inject 25 Units into the skin daily.        Allergies:  Allergies  Allergen Reactions   Lithium Other (See Comments)    Spinal fluid built up in brain   Dulaglutide Nausea And Vomiting and Other (See Comments)   Penicillins Other (See Comments)    UNKNOWN CHILDHOOD REACTION    Past Medical History, Surgical history, Social history, and Family History were reviewed and updated.  Review of Systems: Review of Systems  Constitutional:  Positive for malaise/fatigue.  HENT: Negative.    Eyes: Negative.   Respiratory: Negative.    Cardiovascular:  Positive for leg swelling.  Gastrointestinal:  Positive for nausea.  Genitourinary: Negative.   Musculoskeletal:  Positive for joint pain and myalgias.  Skin: Negative.   Neurological:  Positive for tingling.  Endo/Heme/Allergies: Negative.   Psychiatric/Behavioral: Negative.       Physical Exam:  height is 5\' 2"  (1.575 m) and weight is 185 lb (83.9 kg). Her oral temperature is 98.4 F (36.9 C). Her blood pressure is 152/72 (abnormal) and her pulse is 117 (abnormal). Her respiration is 20 and oxygen saturation is 98%.   Wt Readings from Last 3 Encounters:  08/25/23 185 lb (83.9 kg)  08/10/23 190 lb 2 oz (86.2 kg)  07/22/23 189 lb (85.7 kg)    Physical Exam Vitals reviewed.  Constitutional:      Comments: On her breast exam, the right breast is without masses, edema or erythema.  There is no right axillary adenopathy.  Her left breast is somewhat contracted from radiation and surgery.  It is somewhat firm.  It is somewhat swollen although still contracted.  It is tender.  I cannot palpate any adenopathy in the left axilla.  There is no left nipple discharge.  HENT:     Head: Normocephalic and atraumatic.  Eyes:     Pupils: Pupils are equal, round, and reactive to light.  Cardiovascular:     Rate and Rhythm: Normal rate and regular rhythm.     Heart sounds: Normal heart sounds.   Pulmonary:     Effort: Pulmonary effort is normal.     Breath sounds: Normal breath sounds.  Abdominal:     General: Bowel sounds are normal.     Palpations: Abdomen is soft.  Musculoskeletal:        General: No tenderness or deformity. Normal range of motion.     Cervical back: Normal range of motion.  Lymphadenopathy:     Cervical: No cervical adenopathy.  Skin:    General: Skin is warm and dry.     Findings: No erythema or rash.  Neurological:     Mental Status: She is alert and oriented to person, place, and time.  Psychiatric:        Behavior: Behavior normal.        Thought Content: Thought content normal.        Judgment: Judgment normal.     Lab Results  Component Value Date   WBC 7.0 08/25/2023   HGB 12.6 08/25/2023   HCT 36.5 08/25/2023   MCV 87.3 08/25/2023   PLT 280 08/25/2023   Lab Results  Component Value Date   FERRITIN 42 08/05/2022   IRON 57 08/05/2022   TIBC 360 08/05/2022   UIBC 303 08/05/2022   IRONPCTSAT 16 08/05/2022   Lab Results  Component Value Date   RETICCTPCT 4.0 (H) 08/05/2022   RBC 4.18 08/25/2023   No results found for: "KPAFRELGTCHN", "LAMBDASER", "KAPLAMBRATIO" No results found for: "IGGSERUM", "IGA", "IGMSERUM" No results found for: "TOTALPROTELP", "ALBUMINELP", "A1GS", "A2GS", "BETS", "BETA2SER", "GAMS", "MSPIKE", "SPEI"   Chemistry      Component Value Date/Time   NA 135 08/25/2023 1439   K 4.0 08/25/2023 1439   CL 102 08/25/2023 1439   CO2 24 08/25/2023 1439   BUN 15 08/25/2023 1439   CREATININE 0.67 08/25/2023 1439   CREATININE 0.90 07/22/2023 1305      Component Value Date/Time   CALCIUM 9.5  08/25/2023 1439   ALKPHOS 67 08/25/2023 1439   AST 27 08/25/2023 1439   AST 24 07/22/2023 1305   ALT 28 08/25/2023 1439   ALT 16 07/22/2023 1305   BILITOT 0.3 08/25/2023 1439   BILITOT 0.3 07/22/2023 1305       Impression and Plan: Ms. Auker is a very pleasant  57 yo caucasian female with recurrent ductal carcinoma of  the left breast.  She currently is off therapy.  She has been off the epi since January.  Again, the left breast is the source of her problems.  We do not know if there is any obvious recurrence in the breast.  Again the PET scan does not show any evidence of recurrence outside of the breast tissue.  There is nothing that even suggest tumor in the breast itself.  However, we will go ahead and get a biopsy.  Hopefully the antibiotics will help with this swelling in the left side of her neck.  I know this is somewhat complicated.  There is a lot going on.  Hopefully, we will be able to see if surgery would be able to manage doing a mastectomy.  We will see about MRI of the brain.  We will make sure that there is nothing going on up in the brain.  I cannot find anything neurologically on her right now.  We probably had to have her come back in about a month or so.  Again, there is a lot going on that we really need to try to sort out.   [name], MD 10/31/20244:51 PM

## 2023-08-26 ENCOUNTER — Other Ambulatory Visit: Payer: Self-pay | Admitting: Hematology & Oncology

## 2023-08-26 DIAGNOSIS — Z171 Estrogen receptor negative status [ER-]: Secondary | ICD-10-CM

## 2023-08-26 LAB — CANCER ANTIGEN 27.29: CA 27.29: 68.9 U/mL — ABNORMAL HIGH (ref 0.0–38.6)

## 2023-08-26 LAB — IRON AND IRON BINDING CAPACITY (CC-WL,HP ONLY)
Iron: 61 ug/dL (ref 28–170)
Saturation Ratios: 15 % (ref 10.4–31.8)
TIBC: 400 ug/dL (ref 250–450)
UIBC: 339 ug/dL (ref 148–442)

## 2023-08-30 ENCOUNTER — Ambulatory Visit (HOSPITAL_BASED_OUTPATIENT_CLINIC_OR_DEPARTMENT_OTHER): Payer: Medicaid Other | Admitting: Internal Medicine

## 2023-09-03 ENCOUNTER — Ambulatory Visit (HOSPITAL_COMMUNITY)
Admission: RE | Admit: 2023-09-03 | Discharge: 2023-09-03 | Disposition: A | Discharge status: Home / Self Care | Payer: Medicaid Other | Source: Ambulatory Visit | Attending: Hematology & Oncology | Admitting: Hematology & Oncology

## 2023-09-03 DIAGNOSIS — C50011 Malignant neoplasm of nipple and areola, right female breast: Secondary | ICD-10-CM | POA: Insufficient documentation

## 2023-09-03 DIAGNOSIS — C50012 Malignant neoplasm of nipple and areola, left female breast: Secondary | ICD-10-CM | POA: Insufficient documentation

## 2023-09-03 MED ORDER — GADOBUTROL 1 MMOL/ML IV SOLN
8.0000 mL | Freq: Once | INTRAVENOUS | Status: AC | PRN
  Administered 2023-09-03: 8 mL via INTRAVENOUS

## 2023-09-06 ENCOUNTER — Encounter: Payer: Self-pay | Admitting: *Deleted

## 2023-09-06 NOTE — Progress Notes (Unsigned)
Established patient visit   Patient: Ariana Herrera   DOB: 30-Nov-1965   57 y.o. Female  MRN: [id number] Visit Date: 09/07/2023  Today's healthcare provider: Alfredia Ferguson, PA-C   No chief complaint on file.  Subjective     ***  Medications: Outpatient Medications Prior to Visit  Medication Sig   ACCU-CHEK GUIDE test strip 3 (three) times daily.   albuterol (ACCUNEB) 1.25 MG/3ML nebulizer solution Take 1 ampule by nebulization 4 (four) times daily as needed for wheezing or shortness of breath.   albuterol (VENTOLIN HFA) 108 (90 Base) MCG/ACT inhaler Inhale 2 puffs into the lungs every 6 (six) hours as needed for wheezing or shortness of breath.   benzonatate (TESSALON PERLES) 100 MG capsule Take 1 capsule (100 mg total) by mouth 3 (three) times daily as needed for cough.   cephALEXin (KEFLEX) 500 MG capsule Take 1 capsule (500 mg total) by mouth 3 (three) times daily.   cyclobenzaprine (FLEXERIL) 5 MG tablet Take 1 tablet (5 mg total) by mouth 2 (two) times daily as needed for muscle spasms.   diclofenac (VOLTAREN) 75 MG EC tablet Take 1 tablet (75 mg total) by mouth 2 (two) times daily.   fenofibrate (TRICOR) 145 MG tablet Take 145 mg by mouth daily.   HYDROcodone-acetaminophen (NORCO) 7.5-325 MG tablet Take 1 tablet by mouth every 6 (six) hours as needed for moderate pain (pain score 4-6).   insulin aspart (NOVOLOG FLEXPEN) 100 UNIT/ML FlexPen Inject 3 Units into the skin 3 (three) times daily with meals. (Patient taking differently: Inject 10-16 Units into the skin 3 (three) times daily with meals.)   insulin degludec (TRESIBA FLEXTOUCH) 100 UNIT/ML FlexTouch Pen Inject 25 Units into the skin daily.   linaclotide (LINZESS) 290 MCG CAPS capsule Take 1 capsule (290 mcg total) by mouth daily before breakfast.   loperamide (IMODIUM) 2 MG capsule 2 mg as needed.   metFORMIN (GLUCOPHAGE) 1000 MG tablet Take 1,000 mg by mouth 2 (two) times daily with a meal.   nitroGLYCERIN  (NITROSTAT) 0.4 MG SL tablet Place 1 tablet (0.4 mg total) under the tongue every 5 (five) minutes as needed for chest pain. (Patient not taking: Reported on 08/25/2023)   omeprazole (PRILOSEC) 40 MG capsule Take 40 mg by mouth 2 (two) times daily.   ondansetron (ZOFRAN) 8 MG tablet TAKE 1 TAB BY MOUTH EVERY 8 HOURS AS NEEDED FOR NAUSEA OR VOMITING. START ON THE THIRD DAY AFTER CHEMOTHERAPY.   oxybutynin (DITROPAN-XL) 5 MG 24 hr tablet Take 1 tablet (5 mg total) by mouth at bedtime.   pravastatin (PRAVACHOL) 40 MG tablet Take 1 tablet (40 mg total) by mouth daily.   QUEtiapine (SEROQUEL) 400 MG tablet Take 800 mg by mouth at bedtime.   Facility-Administered Medications Prior to Visit  Medication Dose Route Frequency Provider   sodium chloride flush (NS) 0.9 % injection 10 mL  10 mL Intravenous PRN [name], MD    Review of Systems {Insert previous labs (optional):23779} {See past labs  Heme  Chem  Endocrine  Serology  Results Review (optional):1}   Objective    LMP 10/04/2016 Comment: after "procedure to stop bleeding" {Insert last BP/Wt (optional):23777}{See vitals history (optional):1}  Physical Exam  ***  No results found for any visits on 09/07/23.  Assessment & Plan    There are no diagnoses linked to this encounter.  ***  No follow-ups on file.       Alfredia Ferguson, PA-C  [facility] [phone] (phone) [phone] (fax)  [facility]

## 2023-09-07 ENCOUNTER — Encounter: Payer: Self-pay | Admitting: Physician Assistant

## 2023-09-07 ENCOUNTER — Ambulatory Visit: Payer: Medicaid Other | Admitting: Physician Assistant

## 2023-09-07 VITALS — BP 130/76 | HR 112 | Temp 98.1°F | Ht 62.0 in | Wt 190.2 lb

## 2023-09-07 DIAGNOSIS — R221 Localized swelling, mass and lump, neck: Secondary | ICD-10-CM | POA: Diagnosis not present

## 2023-09-07 DIAGNOSIS — M62838 Other muscle spasm: Secondary | ICD-10-CM | POA: Diagnosis not present

## 2023-09-07 DIAGNOSIS — G243 Spasmodic torticollis: Secondary | ICD-10-CM | POA: Diagnosis not present

## 2023-09-07 MED ORDER — DIAZEPAM 2 MG PO TABS: 2.0000 mg | ORAL_TABLET | Freq: Two times a day (BID) | ORAL | 0 refills | Status: DC | PRN

## 2023-09-07 MED ORDER — PREDNISONE 20 MG PO TABS: 20.0000 mg | ORAL_TABLET | Freq: Every day | ORAL | 0 refills | Status: DC

## 2023-09-08 ENCOUNTER — Other Ambulatory Visit: Payer: Self-pay | Admitting: Hematology & Oncology

## 2023-09-08 DIAGNOSIS — C50012 Malignant neoplasm of nipple and areola, left female breast: Secondary | ICD-10-CM

## 2023-09-12 ENCOUNTER — Other Ambulatory Visit: Payer: Self-pay | Admitting: Physician Assistant

## 2023-09-12 ENCOUNTER — Encounter: Payer: Self-pay | Admitting: Physician Assistant

## 2023-09-12 ENCOUNTER — Other Ambulatory Visit: Payer: Self-pay | Admitting: Hematology & Oncology

## 2023-09-12 DIAGNOSIS — M62838 Other muscle spasm: Secondary | ICD-10-CM

## 2023-09-12 DIAGNOSIS — G243 Spasmodic torticollis: Secondary | ICD-10-CM

## 2023-09-12 DIAGNOSIS — C50012 Malignant neoplasm of nipple and areola, left female breast: Secondary | ICD-10-CM

## 2023-09-12 NOTE — Addendum Note (Signed)
Addended byAlfredia Ferguson on: 09/12/2023 01:42 PM   Modules accepted: Orders

## 2023-09-15 ENCOUNTER — Encounter: Payer: Self-pay | Admitting: Physician Assistant

## 2023-09-15 NOTE — Progress Notes (Signed)
Cardiology Office Note:  .   Date:  09/20/2023  ID:  Ariana Herrera, DOB [birth year], MRN [id number] PCP: Alfredia Ferguson, PA-C  [facility] Providers Cardiologist:  Chrystie Nose, MD     History of Present Illness: .   Ariana Herrera is a 57 y.o. female with a past medical history of CAD, type 2 DM, HLD, asthma, breast cancer. She is followed by Dr. Rennis Golden and presents today for an annual follow up appointment   Per chart review patient previously underwent echocardiogram on 08/21/20 that showed EF 60-65%, no regional wall motion abnormalities, normal RV function. Underwent LHC on 08/22/20 that showed mild nonobstructive CAD. She was managed medically with lipitor, metoprolol, losartan, fenofibrate.  Patient was last seen by Dr. Rennis Golden on 07/30/22. At that time, patient was doing well from a cardiac perspective. No changes were made to her medications   Today, patient presents for her annual appointment. She reports that she was diagnosed with potential brain cancer, discovered an hour before her appointment. She also reports a several-month history of elevated blood pressure and heart rate, as noted during multiple doctor visits. The patient has not been monitoring her blood pressure at home because she recently moved and has not unpacked her BP cuff. She describes a sensation of being 'wired out' or jittery when her heart rate is high, but denies any fluttering or pounding in the chest. She does report occasional shortness of breath, particularly when walking distances, but denies any difficulty breathing when lying flat. The patient admits to being a smoker, currently consuming half a pack per day, down from previous levels.  The patient also reports infrequent episodes of chest pain, described as a tight sensation lasting approximately 20 minutes. These episodes seem to be associated with periods of heightened stress and occur every few months. She denies any chest pain associated  with physical exertion.   ROS: Patient reports shortness of breath, elevated BP, and elevated HR. Denies palpitations, orthopnea, syncope, near syncope, dizziness   Studies Reviewed: .   Cardiac Studies & Procedures   CARDIAC CATHETERIZATION  CARDIAC CATHETERIZATION 08/22/2020  Narrative  Prox RCA lesion is 30% stenosed.  Mid RCA lesion is 40% stenosed.  Mid RCA to Dist RCA lesion is 20% stenosed.  Ost LAD to Prox LAD lesion is 20% stenosed.  1. Mild non-obstructive CAD 2. Elevated left filling pressures.  Recommendations: Medical management of mild CAD  Findings Coronary Findings Diagnostic  Dominance: Right  Left Anterior Descending Vessel is large. Ost LAD to Prox LAD lesion is 20% stenosed.  Left Circumflex Vessel is large.  Right Coronary Artery Vessel is large. Prox RCA lesion is 30% stenosed. Mid RCA lesion is 40% stenosed. Mid RCA to Dist RCA lesion is 20% stenosed.  Intervention  No interventions have been documented.     ECHOCARDIOGRAM  ECHOCARDIOGRAM COMPLETE 08/21/2020  Narrative ECHOCARDIOGRAM REPORT    Patient Name:   Ariana Herrera Date of Exam: 08/21/2020 Medical Rec #:  [id number]     Height:       62.0 in Accession #:    [id number]    Weight:       160.2 lb Date of Birth:  05/15/1966    BSA:          1.740 m Patient Age:    53 years      BP:           125/70 mmHg Patient Gender: F  HR:           90 bpm. Exam Location:  Inpatient  Procedure: 2D Echo, Color Doppler and Cardiac Doppler  Indications:    NSTEMI  History:        Patient has no prior history of Echocardiogram examinations. Risk Factors:Diabetes.  Sonographer:    Irving Burton Senior RDCS Referring Phys: Leone Brand  IMPRESSIONS   1. Left ventricular ejection fraction, by estimation, is 60 to 65%. The left ventricle has normal function. The left ventricle has no regional wall motion abnormalities. Left ventricular diastolic parameters were normal. 2. Right  ventricular systolic function is normal. The right ventricular size is normal. Tricuspid regurgitation signal is inadequate for assessing PA pressure. 3. The mitral valve is normal in structure. Trivial mitral valve regurgitation. No evidence of mitral stenosis. 4. The aortic valve is tricuspid. Aortic valve regurgitation is not visualized. No aortic stenosis is present. 5. The inferior vena cava is normal in size with greater than 50% respiratory variability, suggesting right atrial pressure of 3 mmHg.  FINDINGS Left Ventricle: Left ventricular ejection fraction, by estimation, is 60 to 65%. The left ventricle has normal function. The left ventricle has no regional wall motion abnormalities. The left ventricular internal cavity size was normal in size. There is no left ventricular hypertrophy. Left ventricular diastolic parameters were normal.  Right Ventricle: The right ventricular size is normal. No increase in right ventricular wall thickness. Right ventricular systolic function is normal. Tricuspid regurgitation signal is inadequate for assessing PA pressure.  Left Atrium: Left atrial size was normal in size.  Right Atrium: Right atrial size was normal in size.  Pericardium: There is no evidence of pericardial effusion.  Mitral Valve: The mitral valve is normal in structure. Trivial mitral valve regurgitation. No evidence of mitral valve stenosis.  Tricuspid Valve: The tricuspid valve is normal in structure. Tricuspid valve regurgitation is not demonstrated.  Aortic Valve: The aortic valve is tricuspid. Aortic valve regurgitation is not visualized. No aortic stenosis is present.  Pulmonic Valve: The pulmonic valve was normal in structure. Pulmonic valve regurgitation is not visualized.  Aorta: The aortic root is normal in size and structure.  Venous: The inferior vena cava is normal in size with greater than 50% respiratory variability, suggesting right atrial pressure of 3  mmHg.  IAS/Shunts: No atrial level shunt detected by color flow Doppler.   LEFT VENTRICLE PLAX 2D LVIDd:         4.00 cm  Diastology LVIDs:         2.10 cm  LV e' medial:    8.16 cm/s LV PW:         0.60 cm  LV E/e' medial:  9.9 LV IVS:        0.90 cm  LV e' lateral:   9.03 cm/s LVOT diam:     2.00 cm  LV E/e' lateral: 9.0 LV SV:         62 LV SV Index:   36 LVOT Area:     3.14 cm   RIGHT VENTRICLE RV S prime:     9.57 cm/s TAPSE (M-mode): 1.7 cm  LEFT ATRIUM             Index       RIGHT ATRIUM           Index LA diam:        2.50 cm 1.44 cm/m  RA Area:     11.90 cm LA Vol (A2C):  38.1 ml 21.90 ml/m RA Volume:   26.80 ml  15.41 ml/m LA Vol (A4C):   28.3 ml 16.27 ml/m LA Biplane Vol: 32.8 ml 18.85 ml/m AORTIC VALVE LVOT Vmax:   102.00 cm/s LVOT Vmean:  75.900 cm/s LVOT VTI:    0.197 m  AORTA Ao Root diam: 3.00 cm Ao Asc diam:  3.20 cm  MITRAL VALVE MV Area (PHT): 3.21 cm    SHUNTS MV Decel Time: 236 msec    Systemic VTI:  0.20 m MV E velocity: 81.00 cm/s  Systemic Diam: 2.00 cm MV A velocity: 68.60 cm/s MV E/A ratio:  1.18  Marca Ancona MD Electronically signed by Marca Ancona MD Signature Date/Time: 08/21/2020/2:50:07 PM    Final     CT SCANS  CT CARDIAC SCORING (SELF PAY ONLY) 08/21/2020  Addendum 08/21/2020  6:22 PM ADDENDUM REPORT: 08/21/2020 18:20  EXAM: OVER-READ INTERPRETATION  CT CHEST  The following report is an over-read performed by radiologist Dr. Maryelizabeth [facility] on 08/21/2020. This over-read does not include interpretation of cardiac or coronary anatomy or pathology. The calcium score interpretation by the cardiologist is attached.  COMPARISON:  CT chest 08/20/2020  FINDINGS: Limited view of the lung parenchyma demonstrates no suspicious nodularity. Airways are normal.  Limited view of the mediastinum demonstrates no adenopathy. Esophagus normal.  Limited view of the upper abdomen  unremarkable.  Limited view of the skeleton and chest wall is unremarkable.  IMPRESSION: No significant extracardiac findings.   Electronically Signed By: Genevive Bi M.D. On: 08/21/2020 18:20  Narrative CLINICAL DATA:  Risk stratification  EXAM: Coronary Calcium Score  TECHNIQUE: The patient was scanned on a [employer]. Axial non-contrast 3 mm slices were carried out through the heart. The data set was analyzed on a dedicated work station and scored using the Agatson method.  FINDINGS: Non-cardiac: See separate report from [facility].  Ascending Aorta: Normal caliber.  Atherosclerosis.  Pericardium: Normal  Coronary arteries: Normal origins.  Multivessel CAC.  IMPRESSION: Coronary calcium score of 35. This was 94th percentile for age and sex matched control.  Electronically Signed: By: Chrystie Nose M.D. On: 08/21/2020 16:27          Risk Assessment/Calculations:             Physical Exam:   VS:  BP 134/84   Pulse (!) 120   Ht 5\' 2"  (1.575 m)   Wt 184 lb 3.2 oz (83.6 kg)   LMP 10/04/2016 Comment: after "procedure to stop bleeding"  SpO2 97%   BMI 33.69 kg/m    Wt Readings from Last 3 Encounters:  09/20/23 184 lb 3.2 oz (83.6 kg)  09/20/23 183 lb (83 kg)  09/07/23 190 lb 4 oz (86.3 kg)    GEN: Well nourished, well developed in no acute distress. Sitting comfortably in the chair  NECK: No JVD CARDIAC: Regular rhythm, tachycardic. no murmurs, rubs, gallops. Radial pulses 2+ bilaterally  RESPIRATORY:  Clear to auscultation without rales, wheezing or rhonchi. Normal work of breathing on room air   ABDOMEN: Soft, non-tender, non-distended EXTREMITIES:  No edema in BLE; No deformity   ASSESSMENT AND PLAN: .    Nonobstructive CAD  - Patient underwent cardiac catheterization in 07/2020 that showed mild nonobstructive CAD  - Patient reports having brief episodes of chest pain once every few months. These episodes occur when  she is under a lot of emotional stress. She denies chest pain on exertion - EKG today nonischemic  -  Her symptoms are very infrequent, resolve on their own, and are associated with emotional stress. No indication to repeat ischemic evaluation at this time  - Continue statin therapy for risk reduction   HLD  - Lipid panel from 07/2022 showed LDL 96, HDL 49, triglycerides 239, total cholesterol 185 - Currently on pravastatin 40 mg daily and fenofibrate 145 mg daily  - Ordered lipid panel today. She is fasting  - ALT 28 in 07/2023   HTN  Sinus Tachycardia  - Patient reports that at her last few doctors appointments, she has been told that her BP is elevated and her HR is fast. Denies palpitations, sometimes feels jittery when her HR is elevated  - EKG today showed sinus tachycardia, HR 115 BPM  - She admits to drinking about 3 cans of caffeine soda per day. Discussed cutting back on caffeine and drinking water to stay hydrated  - Of note, patient was told she may have brain cancer about 1 hour before her appointment. She admits to being quite stressed which is likely affecting HR and BP  - BP was initially elevated to 148/70, but improved to 134/84 with rest  - Start metoprolol tartrate 25 mg BID for BP and HR control   Type 2 DM  - Managed by PCP  - A1c 9.3 in 04/2023  - Continue metformin, insulin   Tobacco Use   Shortness of Breath  - Patient is working to cut back/quit smoking. Down to 1/2 pack per day  - She does have some shortness of breath on exertion which she attributes to tobacco use. I agree that this is likely the cause - Echocardiogram from 07/2020 showed EF 60-65%, no wall motion abnormalities, normal RV function  - Discussed possible repeat echo, patient prefers to hold off for now. If she continues to have SOB after she quits smoking, may repeat echo in the future    Dispo: Follow up in 6 months   Signed, Jonita Albee, PA-C

## 2023-09-17 ENCOUNTER — Other Ambulatory Visit: Payer: Self-pay | Admitting: Physician Assistant

## 2023-09-17 DIAGNOSIS — M62838 Other muscle spasm: Secondary | ICD-10-CM

## 2023-09-19 ENCOUNTER — Other Ambulatory Visit (HOSPITAL_BASED_OUTPATIENT_CLINIC_OR_DEPARTMENT_OTHER): Payer: Self-pay

## 2023-09-19 MED ORDER — FREESTYLE LIBRE 3 SENSOR MISC
1.0000 | 12 refills | Status: DC
  Filled 2023-09-19: qty 2, 28d supply, fill #0
  Filled 2023-11-28 – 2023-12-19 (×2): qty 2, 28d supply, fill #1
  Filled 2024-01-23 – 2024-01-26 (×3): qty 2, 28d supply, fill #2

## 2023-09-19 NOTE — Telephone Encounter (Signed)
Requesting: diazepam 2mg   Contract: None UDS: None Last Visit: 09/07/23 Next Visit: 01/11/24 Last Refill: 09/07/23 #15 and 0RF   Please Advise

## 2023-09-20 ENCOUNTER — Other Ambulatory Visit: Payer: Medicaid Other

## 2023-09-20 ENCOUNTER — Inpatient Hospital Stay: Payer: Medicaid Other

## 2023-09-20 ENCOUNTER — Other Ambulatory Visit: Payer: Self-pay | Admitting: Hematology & Oncology

## 2023-09-20 ENCOUNTER — Other Ambulatory Visit: Payer: Self-pay | Admitting: Physician Assistant

## 2023-09-20 ENCOUNTER — Ambulatory Visit: Payer: Medicaid Other | Attending: Cardiology | Admitting: Cardiology

## 2023-09-20 ENCOUNTER — Inpatient Hospital Stay (HOSPITAL_BASED_OUTPATIENT_CLINIC_OR_DEPARTMENT_OTHER): Payer: Medicaid Other | Admitting: Medical Oncology

## 2023-09-20 ENCOUNTER — Encounter: Payer: Self-pay | Admitting: Cardiology

## 2023-09-20 ENCOUNTER — Encounter: Payer: Self-pay | Admitting: Medical Oncology

## 2023-09-20 ENCOUNTER — Ambulatory Visit: Payer: Medicaid Other | Admitting: Medical Oncology

## 2023-09-20 ENCOUNTER — Encounter: Payer: Self-pay | Admitting: Physician Assistant

## 2023-09-20 ENCOUNTER — Inpatient Hospital Stay: Payer: Medicaid Other | Attending: Hematology & Oncology

## 2023-09-20 VITALS — BP 127/70 | HR 85 | Temp 98.5°F | Resp 19 | Ht 62.0 in | Wt 183.0 lb

## 2023-09-20 VITALS — BP 134/84 | HR 120 | Ht 62.0 in | Wt 184.2 lb

## 2023-09-20 DIAGNOSIS — I2510 Atherosclerotic heart disease of native coronary artery without angina pectoris: Secondary | ICD-10-CM | POA: Diagnosis not present

## 2023-09-20 DIAGNOSIS — R600 Localized edema: Secondary | ICD-10-CM

## 2023-09-20 DIAGNOSIS — Z171 Estrogen receptor negative status [ER-]: Secondary | ICD-10-CM | POA: Diagnosis not present

## 2023-09-20 DIAGNOSIS — I10 Essential (primary) hypertension: Secondary | ICD-10-CM

## 2023-09-20 DIAGNOSIS — M62838 Other muscle spasm: Secondary | ICD-10-CM

## 2023-09-20 DIAGNOSIS — C50012 Malignant neoplasm of nipple and areola, left female breast: Secondary | ICD-10-CM

## 2023-09-20 DIAGNOSIS — G939 Disorder of brain, unspecified: Secondary | ICD-10-CM | POA: Insufficient documentation

## 2023-09-20 DIAGNOSIS — R221 Localized swelling, mass and lump, neck: Secondary | ICD-10-CM | POA: Insufficient documentation

## 2023-09-20 DIAGNOSIS — Z794 Long term (current) use of insulin: Secondary | ICD-10-CM

## 2023-09-20 DIAGNOSIS — E785 Hyperlipidemia, unspecified: Secondary | ICD-10-CM

## 2023-09-20 DIAGNOSIS — E1165 Type 2 diabetes mellitus with hyperglycemia: Secondary | ICD-10-CM

## 2023-09-20 DIAGNOSIS — R000 Tachycardia, unspecified: Secondary | ICD-10-CM

## 2023-09-20 DIAGNOSIS — C50912 Malignant neoplasm of unspecified site of left female breast: Secondary | ICD-10-CM | POA: Insufficient documentation

## 2023-09-20 DIAGNOSIS — R978 Other abnormal tumor markers: Secondary | ICD-10-CM | POA: Diagnosis present

## 2023-09-20 DIAGNOSIS — C50011 Malignant neoplasm of nipple and areola, right female breast: Secondary | ICD-10-CM

## 2023-09-20 LAB — CMP (CANCER CENTER ONLY)
ALT: 18 U/L (ref 0–44)
AST: 40 U/L (ref 15–41)
Albumin: 3.8 g/dL (ref 3.5–5.0)
Alkaline Phosphatase: 57 U/L (ref 38–126)
Anion gap: 11 (ref 5–15)
BUN: 18 mg/dL (ref 6–20)
CO2: 23 mmol/L (ref 22–32)
Calcium: 9.5 mg/dL (ref 8.9–10.3)
Chloride: 103 mmol/L (ref 98–111)
Creatinine: 0.76 mg/dL (ref 0.44–1.00)
GFR, Estimated: >60 mL/min (ref >60)
Glucose, Bld: 295 mg/dL — ABNORMAL HIGH (ref 70–99)
Potassium: 3.9 mmol/L (ref 3.5–5.1)
Sodium: 137 mmol/L (ref 135–145)
Total Bilirubin: 0.3 mg/dL (ref <1.2)
Total Protein: 6.7 g/dL (ref 6.5–8.1)

## 2023-09-20 LAB — CBC WITH DIFFERENTIAL (CANCER CENTER ONLY)
Abs Immature Granulocytes: 0.04 10*3/uL (ref 0.00–0.07)
Basophils Absolute: 0 10*3/uL (ref 0.0–0.1)
Basophils Relative: 1 %
Eosinophils Absolute: 0 10*3/uL (ref 0.0–0.5)
Eosinophils Relative: 0 %
HCT: 35.5 % — ABNORMAL LOW (ref 36.0–46.0)
Hemoglobin: 12.2 g/dL (ref 12.0–15.0)
Immature Granulocytes: 1 %
Lymphocytes Relative: 17 %
Lymphs Abs: 1.3 10*3/uL (ref 0.7–4.0)
MCH: 30.6 pg (ref 26.0–34.0)
MCHC: 34.4 g/dL (ref 30.0–36.0)
MCV: 89 fL (ref 80.0–100.0)
Monocytes Absolute: 0.5 10*3/uL (ref 0.1–1.0)
Monocytes Relative: 7 %
Neutro Abs: 5.6 10*3/uL (ref 1.7–7.7)
Neutrophils Relative %: 74 %
Platelet Count: 310 10*3/uL (ref 150–400)
RBC: 3.99 MIL/uL (ref 3.87–5.11)
RDW: 14.4 % (ref 11.5–15.5)
WBC Count: 7.5 10*3/uL (ref 4.0–10.5)
nRBC: 0 % (ref 0.0–0.2)

## 2023-09-20 LAB — LACTATE DEHYDROGENASE: LDH: 137 U/L (ref 98–192)

## 2023-09-20 MED ORDER — HEPARIN SOD (PORK) LOCK FLUSH 100 UNIT/ML IV SOLN
500.0000 [IU] | Freq: Once | INTRAVENOUS | Status: AC
  Administered 2023-09-20: 500 [IU] via INTRAVENOUS

## 2023-09-20 MED ORDER — DIAZEPAM 2 MG PO TABS: 2.0000 mg | ORAL_TABLET | Freq: Two times a day (BID) | ORAL | 0 refills | Status: DC | PRN

## 2023-09-20 MED ORDER — SODIUM CHLORIDE 0.9% FLUSH
10.0000 mL | Freq: Once | INTRAVENOUS | Status: AC
  Administered 2023-09-20: 10 mL via INTRAVENOUS

## 2023-09-20 MED ORDER — METOPROLOL TARTRATE 25 MG PO TABS: 25.0000 mg | ORAL_TABLET | Freq: Two times a day (BID) | ORAL | 3 refills | Status: DC

## 2023-09-20 NOTE — Progress Notes (Signed)
Hematology and Oncology Follow Up Visit  Ariana Herrera [id number] [birth year] 57 y.o. 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     08/04/2023.  PET scan was negative for any obvious static disease. She also has an MRI brain on 09/03/2023 which showed a 7 mm dural based lesion that was favored to be a meningioma but a dural metastasis was not ruled out.   She has been having some swelling of the left neck. This has been going on for several weeks.  Again, the PET scan that was done couple weeks ago was unremarkable.  She did have a ultrasound of the neck that was done in September which was unremarkable. She was tried on Keflex and referred to her PCP who tried her on prednisone. Prednisone did not help. She was then sent to orthopedics who referred her to ENT. She reports that she has not heard back from ENT yet. Swelling is still very present. No fevers, cold symptoms now or at the time of the start of symptoms.   Her last CA 27.29 was elevated at 68.9 which is up from 56.6 the month prior and double what it was 6 months ago (31.9). She has a firm area of her left breast, which was not hypermetabolic on PET scan, scheduled to be biopsied in about a week.   She is still smoking.  She is trying to cut back but she finds this very difficult.  She has had no change in bowel or bladder habits.  She has had no cough.  There has been no dysphagia or odynophagia.  Overall, I would say performance status is probably ECOG 1.   Wt Readings from Last 3 Encounters:  09/20/23 183 lb (83 kg)  09/07/23  190 lb 4 oz (86.3 kg)  08/25/23 185 lb (83.9 kg)    Medications:  Allergies as of 09/20/2023       Reactions   Lithium Other (See Comments)   Spinal fluid built up in brain   Dulaglutide Nausea And Vomiting, Other (See Comments)   Penicillins Other (See Comments)   UNKNOWN CHILDHOOD REACTION        Medication List        Accurate as of September 20, 2023  9:40 AM. If you have any questions, ask your nurse or doctor.          STOP taking these medications    benzonatate 100 MG capsule Commonly known as: Lawyer Stopped by: Ariana Herrera   predniSONE 20 MG tablet Commonly known as: DELTASONE Stopped by: Ariana Herrera       TAKE these medications    Accu-Chek Guide test strip Generic drug: glucose blood 3 (three) times daily.   albuterol 1.25 MG/3ML nebulizer solution Commonly known as: ACCUNEB Take 1 ampule by nebulization 4 (four) times daily as needed for wheezing or shortness of breath.   albuterol 108 (90 Base) MCG/ACT inhaler Commonly known as: VENTOLIN HFA Inhale 2 puffs into the lungs every 6 (six) hours as needed for wheezing or shortness of breath.   diazepam 2 MG  tablet Commonly known as: Valium Take 1 tablet (2 mg total) by mouth every 12 (twelve) hours as needed for muscle spasms.   diclofenac 75 MG EC tablet Commonly known as: VOLTAREN Take 1 tablet (75 mg total) by mouth 2 (two) times daily.   fenofibrate 145 MG tablet Commonly known as: TRICOR Take 145 mg by mouth daily.   FreeStyle Libre 3 Sensor Misc Apply 1 sensor every 14 (fourteen) days.   HYDROcodone-acetaminophen 7.5-325 MG tablet Commonly known as: Norco Take 1 tablet by mouth every 6 (six) hours as needed for moderate pain (pain score 4-6).   linaclotide 290 MCG Caps capsule Commonly known as: Linzess Take 1 capsule (290 mcg total) by mouth daily before breakfast.   loperamide 2 MG capsule Commonly known as: IMODIUM 2 mg as needed.   metFORMIN 1000  MG tablet Commonly known as: GLUCOPHAGE Take 1,000 mg by mouth 2 (two) times daily with a meal.   nitroGLYCERIN 0.4 MG SL tablet Commonly known as: NITROSTAT Place 1 tablet (0.4 mg total) under the tongue every 5 (five) minutes as needed for chest pain.   NovoLOG FlexPen 100 UNIT/ML FlexPen Generic drug: insulin aspart Inject 3 Units into the skin 3 (three) times daily with meals. What changed: how much to take   omeprazole 40 MG capsule Commonly known as: PRILOSEC Take 40 mg by mouth 2 (two) times daily.   ondansetron 8 MG tablet Commonly known as: ZOFRAN TAKE 1 TAB BY MOUTH EVERY 8 HOURS AS NEEDED FOR NAUSEA OR VOMITING. START ON THE THIRD DAY AFTER CHEMOTHERAPY.   oxybutynin 5 MG 24 hr tablet Commonly known as: DITROPAN-XL Take 1 tablet (5 mg total) by mouth at bedtime.   pravastatin 40 MG tablet Commonly known as: PRAVACHOL Take 1 tablet (40 mg total) by mouth daily.   QUEtiapine 400 MG tablet Commonly known as: SEROQUEL Take 800 mg by mouth at bedtime.   Ariana Herrera FlexTouch 100 UNIT/ML FlexTouch Pen Generic drug: insulin degludec Inject 25 Units into the skin daily.        Allergies:  Allergies  Allergen Reactions   Lithium Other (See Comments)    Spinal fluid built up in brain   Dulaglutide Nausea And Vomiting and Other (See Comments)   Penicillins Other (See Comments)    UNKNOWN CHILDHOOD REACTION    Past Medical History, Surgical history, Social history, and Family History were reviewed and updated.  Review of Systems: Review of Systems  Constitutional:  Positive for malaise/fatigue.  HENT: Negative.    Eyes: Negative.   Respiratory: Negative.    Cardiovascular:  Positive for leg swelling.  Gastrointestinal:  Positive for nausea.  Genitourinary: Negative.   Musculoskeletal:  Positive for joint pain and myalgias.  Skin: Negative.   Neurological:  Positive for tingling.  Endo/Heme/Allergies: Negative.   Psychiatric/Behavioral: Negative.        Physical Exam:  height is 5\' 2"  (1.575 m) and weight is 183 lb (83 kg). Her oral temperature is 98.5 F (36.9 C). Her blood pressure is 127/70 and her pulse is 85. Her respiration is 19 and oxygen saturation is 98%.   Wt Readings from Last 3 Encounters:  09/20/23 183 lb (83 kg)  09/07/23 190 lb 4 oz (86.3 kg)  08/25/23 185 lb (83.9 kg)    Physical Exam Vitals reviewed.  HENT:     Head: Normocephalic and atraumatic.     Nose: Nose normal.     Mouth/Throat:     Pharynx: Oropharynx is clear.  Eyes:  Pupils: Pupils are equal, round, and reactive to light.  Neck:     Comments: Firmness and edema of the left parotid gland which extends down the neck. Non erythematic  Cardiovascular:     Rate and Rhythm: Normal rate and regular rhythm.     Heart sounds: Normal heart sounds.  Pulmonary:     Effort: Pulmonary effort is normal.     Breath sounds: Normal breath sounds.  Abdominal:     General: Bowel sounds are normal.     Palpations: Abdomen is soft.  Musculoskeletal:        General: No tenderness or deformity. Normal range of motion.     Cervical back: Normal range of motion.  Lymphadenopathy:     Cervical: No cervical adenopathy.  Skin:    General: Skin is warm and dry.     Findings: No erythema or rash.  Neurological:     Mental Status: She is alert and oriented to person, place, and time.  Psychiatric:        Behavior: Behavior normal.        Thought Content: Thought content normal.        Judgment: Judgment normal.          Lab Results  Component Value Date   WBC 7.5 09/20/2023   HGB 12.2 09/20/2023   HCT 35.5 (L) 09/20/2023   MCV 89.0 09/20/2023   PLT 310 09/20/2023   Lab Results  Component Value Date   FERRITIN 33 08/25/2023   IRON 61 08/25/2023   TIBC 400 08/25/2023   UIBC 339 08/25/2023   IRONPCTSAT 15 08/25/2023   Lab Results  Component Value Date   RETICCTPCT 4.0 (H) 08/05/2022   RBC 3.99 09/20/2023   No results found for:  "KPAFRELGTCHN", "LAMBDASER", "KAPLAMBRATIO" No results found for: "IGGSERUM", "IGA", "IGMSERUM" No results found for: "TOTALPROTELP", "ALBUMINELP", "A1GS", "A2GS", "BETS", "BETA2SER", "GAMS", "MSPIKE", "SPEI"   Chemistry      Component Value Date/Time   NA 135 08/25/2023 1439   K 4.0 08/25/2023 1439   CL 102 08/25/2023 1439   CO2 24 08/25/2023 1439   BUN 15 08/25/2023 1439   CREATININE 0.67 08/25/2023 1439   CREATININE 0.90 07/22/2023 1305      Component Value Date/Time   CALCIUM 9.5 08/25/2023 1439   ALKPHOS 67 08/25/2023 1439   AST 27 08/25/2023 1439   AST 24 07/22/2023 1305   ALT 28 08/25/2023 1439   ALT 16 07/22/2023 1305   BILITOT 0.3 08/25/2023 1439   BILITOT 0.3 07/22/2023 1305     Encounter Diagnoses  Name Primary?   Swelling of left parotid gland Yes   Brain lesion    Malignant neoplasm of nipple of left breast in female, unspecified estrogen receptor status (HCC)      Impression and Plan: Ms. Moos is a very pleasant  57 yo caucasian female with recurrent ductal carcinoma of the left breast.  She currently is off therapy.  I am concerned with her symptoms, rising CA 27.29 level, and MR findings. I have placed a referral to neuro oncology, a STAT referral to ENT and have placed a STAT CT neck order after getting radiology's input.   I do agree that she should keep her planned biopsy of the left breast.   We will see her back in 2 weeks for follow up APP with no labs. She will then follow up with Dr. Myna Hidalgo in 4 weeks with labs  Disposition: RTC 2 weeks me without labs RTC  4 weeks Ennever, labs (CBC w/, CMP, LDH, CA 27.29)  Brand Males [place], PA-C 11/26/20249:40 AM

## 2023-09-20 NOTE — Patient Instructions (Addendum)
Medication Instructions:  Your physician has recommended you make the following change in your medication:  START: Metoprolol tartrate (Lopressor) 25 mg twice daily  *If you need a refill on your cardiac medications before your next appointment, please call your pharmacy*   Lab Work: Lipid If you have labs (blood work) drawn today and your tests are completely normal, you will receive your results only by: MyChart Message (if you have MyChart) OR A paper copy in the mail If you have any lab test that is abnormal or we need to change your treatment, we will call you to review the results.  Follow-Up: At [facility], you and your health needs are our priority.  As part of our continuing mission to provide you with exceptional heart care, we have created designated Provider Care Teams.  These Care Teams include your primary Cardiologist (physician) and Advanced Practice Providers (APPs -  Physician Assistants and Nurse Practitioners) who all work together to provide you with the care you need, when you need it.     Your next appointment:   6 month(s)  Provider:   Robet Leu, PA

## 2023-09-21 ENCOUNTER — Other Ambulatory Visit: Payer: Self-pay | Admitting: Hematology & Oncology

## 2023-09-21 DIAGNOSIS — C50012 Malignant neoplasm of nipple and areola, left female breast: Secondary | ICD-10-CM

## 2023-09-21 LAB — LIPID PANEL
Chol/HDL Ratio: 5.8 {ratio} — ABNORMAL HIGH (ref 0.0–4.4)
Cholesterol, Total: 215 mg/dL — ABNORMAL HIGH (ref 100–199)
HDL: 37 mg/dL — ABNORMAL LOW (ref >39)
LDL Chol Calc (NIH): 95 mg/dL (ref 0–99)
Triglycerides: 499 mg/dL — ABNORMAL HIGH (ref 0–149)
VLDL Cholesterol Cal: 83 mg/dL — ABNORMAL HIGH (ref 5–40)

## 2023-09-22 LAB — CANCER ANTIGEN 27.29: CA 27.29: 76.2 U/mL — ABNORMAL HIGH (ref 0.0–38.6)

## 2023-09-23 ENCOUNTER — Other Ambulatory Visit: Payer: Self-pay | Admitting: Hematology & Oncology

## 2023-09-23 DIAGNOSIS — C50011 Malignant neoplasm of nipple and areola, right female breast: Secondary | ICD-10-CM

## 2023-09-23 DIAGNOSIS — M25512 Pain in left shoulder: Secondary | ICD-10-CM

## 2023-09-24 ENCOUNTER — Other Ambulatory Visit: Payer: Self-pay

## 2023-09-24 MED ORDER — HYDROCODONE-ACETAMINOPHEN 7.5-325 MG PO TABS
1.0000 | ORAL_TABLET | Freq: Four times a day (QID) | ORAL | 0 refills | Status: DC | PRN
  Filled 2023-09-24: qty 120, 30d supply, fill #0

## 2023-09-25 ENCOUNTER — Other Ambulatory Visit (HOSPITAL_BASED_OUTPATIENT_CLINIC_OR_DEPARTMENT_OTHER): Payer: Self-pay

## 2023-09-26 ENCOUNTER — Other Ambulatory Visit (HOSPITAL_BASED_OUTPATIENT_CLINIC_OR_DEPARTMENT_OTHER): Payer: Self-pay

## 2023-09-26 ENCOUNTER — Telehealth: Payer: Self-pay

## 2023-09-26 ENCOUNTER — Other Ambulatory Visit: Payer: Self-pay

## 2023-09-26 DIAGNOSIS — E785 Hyperlipidemia, unspecified: Secondary | ICD-10-CM

## 2023-09-26 NOTE — Telephone Encounter (Signed)
Left message to call back  

## 2023-09-26 NOTE — Telephone Encounter (Signed)
-----   Message from Jonita Albee sent at 09/26/2023  3:36 PM EST ----- Please tell patient that her lipid panel from 11/26 showed her LDL (bad cholesterol) was 95, which is within normal range. However, her triglycerides are elevated to 499, her HDL (good cholesterol) is low at 37, and her total cholesterol is a bit high at 215.   She should continue her current dose of fenofibrate. She should stop taking pravastatin and transition to crestor 40 mg daily. Repeat LFTs and lipid panel in 8 weeks  Thanks KJ

## 2023-09-27 NOTE — Telephone Encounter (Signed)
-----   Message from Jonita Albee sent at 09/26/2023  3:36 PM EST ----- Please tell patient that her lipid panel from 11/26 showed her LDL (bad cholesterol) was 95, which is within normal range. However, her triglycerides are elevated to 499, her HDL (good cholesterol) is low at 37, and her total cholesterol is a bit high at 215.   She should continue her current dose of fenofibrate. She should stop taking pravastatin and transition to crestor 40 mg daily. Repeat LFTs and lipid panel in 8 weeks  Thanks KJ

## 2023-09-27 NOTE — Telephone Encounter (Signed)
Left message to call back  

## 2023-09-28 ENCOUNTER — Ambulatory Visit: Payer: Medicaid Other

## 2023-09-28 ENCOUNTER — Ambulatory Visit
Admission: RE | Admit: 2023-09-28 | Discharge: 2023-09-28 | Disposition: A | Discharge status: Home / Self Care | Payer: Medicaid Other | Source: Ambulatory Visit | Attending: Hematology & Oncology | Admitting: Hematology & Oncology

## 2023-09-28 ENCOUNTER — Ambulatory Visit: Admission: RE | Admit: 2023-09-28 | Payer: Medicaid Other | Source: Ambulatory Visit

## 2023-09-28 NOTE — Telephone Encounter (Signed)
Left message to call back  

## 2023-09-29 ENCOUNTER — Ambulatory Visit (HOSPITAL_BASED_OUTPATIENT_CLINIC_OR_DEPARTMENT_OTHER)
Admission: RE | Admit: 2023-09-29 | Discharge: 2023-09-29 | Disposition: A | Discharge status: Home / Self Care | Payer: Medicaid Other | Source: Ambulatory Visit | Attending: Medical Oncology | Admitting: Medical Oncology

## 2023-09-29 DIAGNOSIS — R600 Localized edema: Secondary | ICD-10-CM | POA: Diagnosis present

## 2023-09-29 DIAGNOSIS — G939 Disorder of brain, unspecified: Secondary | ICD-10-CM | POA: Diagnosis present

## 2023-09-29 DIAGNOSIS — C50012 Malignant neoplasm of nipple and areola, left female breast: Secondary | ICD-10-CM | POA: Diagnosis present

## 2023-09-29 MED ORDER — ROSUVASTATIN CALCIUM 40 MG PO TABS: 40.0000 mg | ORAL_TABLET | Freq: Every day | ORAL | 3 refills | Status: DC

## 2023-09-29 MED ORDER — IOHEXOL 300 MG/ML  SOLN
100.0000 mL | Freq: Once | INTRAMUSCULAR | Status: AC | PRN
  Administered 2023-09-29: 100 mL via INTRAVENOUS

## 2023-09-29 NOTE — Telephone Encounter (Signed)
Patient is returning call and is requesting call back.  

## 2023-09-29 NOTE — Telephone Encounter (Signed)
Jonita Albee, PA-C 09/26/2023  3:36 PM EST Back to Top    Please tell patient that her lipid panel from 11/26 showed her LDL (bad cholesterol) was 95, which is within normal range. However, her triglycerides are elevated to 499, her HDL (good cholesterol) is low at 37, and her total cholesterol is a bit high at 215.   She should continue her current dose of fenofibrate. She should stop taking pravastatin and transition to crestor 40 mg daily. Repeat LFTs and lipid panel in 8 weeks   Thanks KJ     __________________________________________________________ Patient identification verified by 2 forms. Marilynn Rail, RN    Called and spoke to patient  Relayed provider result message  Advised patient to discontinue pravastatin, start new Rx for Crestor, present to lab in 2 months for repeat  Patient verbalized understanding, no questions at this time

## 2023-10-01 ENCOUNTER — Emergency Department (HOSPITAL_BASED_OUTPATIENT_CLINIC_OR_DEPARTMENT_OTHER)
Admission: EM | Admit: 2023-10-01 | Discharge: 2023-10-01 | Discharge status: Against Medical Advice | Payer: Medicaid Other | Attending: Emergency Medicine | Admitting: Emergency Medicine

## 2023-10-01 ENCOUNTER — Other Ambulatory Visit: Payer: Self-pay

## 2023-10-01 ENCOUNTER — Encounter (HOSPITAL_BASED_OUTPATIENT_CLINIC_OR_DEPARTMENT_OTHER): Payer: Self-pay

## 2023-10-01 DIAGNOSIS — Z5321 Procedure and treatment not carried out due to patient leaving prior to being seen by health care provider: Secondary | ICD-10-CM | POA: Diagnosis not present

## 2023-10-01 DIAGNOSIS — R220 Localized swelling, mass and lump, head: Secondary | ICD-10-CM | POA: Insufficient documentation

## 2023-10-01 NOTE — ED Triage Notes (Signed)
The patient has swelling around both eyes. She was recently dx with a mass in her neck yesterday.

## 2023-10-03 ENCOUNTER — Ambulatory Visit
Admission: RE | Admit: 2023-10-03 | Discharge: 2023-10-03 | Disposition: A | Discharge status: Home / Self Care | Payer: Medicaid Other | Source: Ambulatory Visit | Attending: Hematology & Oncology | Admitting: Hematology & Oncology

## 2023-10-03 ENCOUNTER — Inpatient Hospital Stay
Admission: RE | Admit: 2023-10-03 | Discharge: 2023-10-03 | Disposition: A | Discharge status: Home / Self Care | Payer: Medicaid Other | Source: Ambulatory Visit | Attending: Hematology & Oncology | Admitting: Hematology & Oncology

## 2023-10-03 ENCOUNTER — Other Ambulatory Visit (HOSPITAL_COMMUNITY): Payer: Self-pay | Admitting: Diagnostic Radiology

## 2023-10-03 DIAGNOSIS — C50012 Malignant neoplasm of nipple and areola, left female breast: Secondary | ICD-10-CM

## 2023-10-03 HISTORY — PX: BREAST BIOPSY: SHX20

## 2023-10-03 MED ORDER — GADOPICLENOL 0.5 MMOL/ML IV SOLN
9.0000 mL | Freq: Once | INTRAVENOUS | Status: AC | PRN
Start: 2023-10-03 — End: 2023-10-03
  Administered 2023-10-03: 9 mL via INTRAVENOUS

## 2023-10-04 ENCOUNTER — Inpatient Hospital Stay: Payer: Medicaid Other | Attending: Hematology & Oncology | Admitting: Medical Oncology

## 2023-10-04 ENCOUNTER — Encounter: Payer: Self-pay | Admitting: Medical Oncology

## 2023-10-04 VITALS — BP 130/55 | HR 105 | Temp 98.4°F | Resp 19 | Ht 62.0 in | Wt 192.0 lb

## 2023-10-04 DIAGNOSIS — R220 Localized swelling, mass and lump, head: Secondary | ICD-10-CM | POA: Diagnosis not present

## 2023-10-04 DIAGNOSIS — I82C12 Acute embolism and thrombosis of left internal jugular vein: Secondary | ICD-10-CM

## 2023-10-04 DIAGNOSIS — C50012 Malignant neoplasm of nipple and areola, left female breast: Secondary | ICD-10-CM | POA: Diagnosis not present

## 2023-10-04 LAB — SURGICAL PATHOLOGY

## 2023-10-04 MED ORDER — ENOXAPARIN SODIUM 40 MG/0.4ML IJ SOSY: 100.0000 mg | PREFILLED_SYRINGE | Freq: Two times a day (BID) | INTRAMUSCULAR | 0 refills | Status: DC

## 2023-10-04 NOTE — Progress Notes (Unsigned)
Hematology and Oncology Follow Up Visit  Ariana Herrera [id number] Aug 02, 1966 57 y.o. 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 is here today for follow-up.  She is here with her husband.   She continues to struggle with jaw and now facial swelling. She recently had her CT neck completed which unfortunately was not called into our service line. She has an upcoming ENT appointment.   08/04/2023 PET scan was negative for any obvious static disease. She also had an MRI brain on 09/03/2023 which showed a 7 mm dural based lesion that was favored to be a meningioma but a dural metastasis was not ruled out. She was referred to neuro oncology for this finding to evaluate further. At this time she reports not having an appointment scheduled.   Today she reports that she has been having some swelling now of the face. She continues to have swelling of the left neck. This has been going on for several weeks.  Again, the PET scan that was done couple weeks ago was unremarkable.  She did have a ultrasound of the neck that was done in September which was unremarkable. She was tried on Keflex and referred to her PCP who tried her on prednisone. Prednisone did not help. She was then sent to orthopedics for the pain associated who referred her to ENT. No fevers, cold symptoms now or at the time of the start of symptoms.   Her last CA 27.29 was elevated at 68.9 which is up from 56.6 the month prior and double what it was 6 months ago (31.9). She has a firm area of her left breast, which was not hypermetabolic on PET scan. This area was  biopsied yesterday. Unfortunately this shows a likely return of her cancer.   She is still smoking.  She is trying to cut back but she finds this very difficult.  She has had no change in bowel or bladder habits.  She has had no cough.  There has been no dysphagia or odynophagia.  Overall, I would say performance status is probably ECOG 1.   Wt Readings from Last 3 Encounters:  10/05/23 186 lb 4.6 oz (84.5 kg)  10/04/23 192 lb (87.1 kg)  10/01/23 182 lb 15.7 oz (83 kg)    Medications:  Allergies as of 10/04/2023       Reactions   Lithium Other (See Comments)   Spinal fluid built up in brain   Dulaglutide Nausea And Vomiting, Other (See Comments)   Penicillins Other (See Comments)   UNKNOWN CHILDHOOD REACTION        Medication List        Accurate as of October 04, 2023 11:59 PM. If you have any questions, ask your nurse or doctor.          STOP taking these medications    diazepam 2 MG tablet Commonly known as: Valium Stopped by: Rushie Chestnut       TAKE these medications    Accu-Chek Guide test strip Generic drug: glucose blood 3 (three) times daily.   albuterol 1.25 MG/3ML nebulizer solution Commonly known as: ACCUNEB Take  1 ampule by nebulization every 6 (six) hours as needed for wheezing or shortness of breath.   albuterol 108 (90 Base) MCG/ACT inhaler Commonly known as: VENTOLIN HFA Inhale 2 puffs into the lungs every 6 (six) hours as needed for wheezing or shortness of breath.   diclofenac 75 MG EC tablet Commonly known as: VOLTAREN Take 1 tablet (75 mg total) by mouth 2 (two) times daily.   enoxaparin 40 MG/0.4ML injection Commonly known as: LOVENOX Inject 1 mL (100 mg total) into the skin every 12 (twelve) hours. Started by: Rushie Chestnut   fenofibrate 145 MG tablet Commonly known as: TRICOR Take 145 mg by mouth daily.   FreeStyle Libre 3 Sensor Misc Apply 1 sensor every 14 (fourteen) days.   HYDROcodone-acetaminophen 7.5-325  MG tablet Commonly known as: Norco Take 1 tablet by mouth every 6 (six) hours as needed for moderate pain (pain score 4-6).   linaclotide 290 MCG Caps capsule Commonly known as: Linzess Take 1 capsule (290 mcg total) by mouth daily before breakfast.   loperamide 2 MG capsule Commonly known as: IMODIUM 2 mg as needed.   metFORMIN 1000 MG tablet Commonly known as: GLUCOPHAGE Take 1,000 mg by mouth 2 (two) times daily with a meal.   metoprolol tartrate 25 MG tablet Commonly known as: LOPRESSOR Take 1 tablet (25 mg total) by mouth 2 (two) times daily.   nitroGLYCERIN 0.4 MG SL tablet Commonly known as: NITROSTAT Place 1 tablet (0.4 mg total) under the tongue every 5 (five) minutes as needed for chest pain.   NovoLOG FlexPen 100 UNIT/ML FlexPen Generic drug: insulin aspart Inject 3 Units into the skin 3 (three) times daily with meals. What changed: how much to take   omeprazole 40 MG capsule Commonly known as: PRILOSEC Take 40 mg by mouth 2 (two) times daily before a meal.   ondansetron 8 MG tablet Commonly known as: ZOFRAN TAKE 1 TAB BY MOUTH EVERY 8 HOURS AS NEEDED FOR NAUSEA OR VOMITING. START ON THE THIRD DAY AFTER CHEMOTHERAPY.   oxybutynin 5 MG 24 hr tablet Commonly known as: DITROPAN-XL Take 1 tablet (5 mg total) by mouth at bedtime.   QUEtiapine 400 MG tablet Commonly known as: SEROQUEL Take 800 mg by mouth at bedtime.   rosuvastatin 40 MG tablet Commonly known as: CRESTOR Take 1 tablet (40 mg total) by mouth daily.        Allergies:  Allergies  Allergen Reactions   Lithium Other (See Comments)    Spinal fluid built up in brain   Dulaglutide Nausea And Vomiting and Other (See Comments)    TRULICITY   Penicillins Other (See Comments)    UNKNOWN CHILDHOOD REACTION    Past Medical History, Surgical history, Social history, and Family History were reviewed and updated.  Review of Systems: Review of Systems  Constitutional:  Positive for  malaise/fatigue.  HENT: Negative.    Eyes: Negative.   Respiratory: Negative.    Cardiovascular:  Positive for leg swelling.  Gastrointestinal:  Positive for nausea.  Genitourinary: Negative.   Musculoskeletal:  Positive for joint pain and myalgias.  Skin: Negative.   Neurological:  Positive for tingling.  Endo/Heme/Allergies: Negative.   Psychiatric/Behavioral: Negative.       Physical Exam:  height is 5\' 2"  (1.575 m) and weight is 192 lb (87.1 kg). Her oral temperature is 98.4 F (36.9 C). Her blood pressure is 130/55 (abnormal) and her pulse is 105 (abnormal). Her respiration is 19 and oxygen saturation is 100%.  Wt Readings from Last 3 Encounters:  10/05/23 186 lb 4.6 oz (84.5 kg)  10/04/23 192 lb (87.1 kg)  10/01/23 182 lb 15.7 oz (83 kg)    Physical Exam Vitals reviewed.  Constitutional:      Comments: Significant swelling of the left face not impacting her mouth or nose. She is able to breathe properly out of her nose. Voice is normal. No swelling of the arms or chest   HENT:     Head: Normocephalic and atraumatic.     Nose: Nose normal.     Mouth/Throat:     Pharynx: Oropharynx is clear.  Eyes:     Pupils: Pupils are equal, round, and reactive to light.  Neck:     Comments: Continues firmness and edema of the left parotid gland which extends down the neck. Non erythematic.  Cardiovascular:     Rate and Rhythm: Normal rate and regular rhythm.     Heart sounds: Normal heart sounds.  Pulmonary:     Effort: Pulmonary effort is normal.     Breath sounds: Normal breath sounds.  Abdominal:     General: Bowel sounds are normal.     Palpations: Abdomen is soft.  Musculoskeletal:        General: No tenderness or deformity. Normal range of motion.     Cervical back: Normal range of motion. No rigidity.  Lymphadenopathy:     Cervical: No cervical adenopathy.  Skin:    General: Skin is warm and dry.     Findings: No erythema or rash.  Neurological:     Mental  Status: She is alert and oriented to person, place, and time.  Psychiatric:        Behavior: Behavior normal.        Thought Content: Thought content normal.        Judgment: Judgment normal.    Lab Results  Component Value Date   WBC 9.4 10/06/2023   HGB 11.6 (L) 10/06/2023   HCT 34.2 (L) 10/06/2023   MCV 91.9 10/06/2023   PLT 294 10/06/2023   Lab Results  Component Value Date   FERRITIN 33 08/25/2023   IRON 61 08/25/2023   TIBC 400 08/25/2023   UIBC 339 08/25/2023   IRONPCTSAT 15 08/25/2023   Lab Results  Component Value Date   RETICCTPCT 4.0 (H) 08/05/2022   RBC 3.72 (L) 10/06/2023   No results found for: "KPAFRELGTCHN", "LAMBDASER", "KAPLAMBRATIO" No results found for: "IGGSERUM", "IGA", "IGMSERUM" No results found for: "TOTALPROTELP", "ALBUMINELP", "A1GS", "A2GS", "BETS", "BETA2SER", "GAMS", "MSPIKE", "SPEI"   Chemistry      Component Value Date/Time   NA 133 (L) 10/06/2023 0035   K 4.1 10/06/2023 0035   CL 101 10/06/2023 0035   CO2 22 10/06/2023 0035   BUN 11 10/06/2023 0035   CREATININE 0.57 10/06/2023 0035   CREATININE 0.76 09/20/2023 0845      Component Value Date/Time   CALCIUM 8.6 (L) 10/06/2023 0035   ALKPHOS 87 10/06/2023 0035   AST 12 (L) 10/06/2023 0035   AST 40 09/20/2023 0845   ALT 13 10/06/2023 0035   ALT 18 09/20/2023 0845   BILITOT 0.5 10/06/2023 0035   BILITOT 0.3 09/20/2023 0845     Encounter Diagnoses  Name Primary?   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  Darras is a very pleasant  57 yo caucasian female with recurrent ductal carcinoma of the left breast.  She currently is off therapy.  Unfortunately the CT of the neck that she just had shows a thrombus as well as significant return of her cancer. In addition the breast biopsy from yesterday shows disease  recurrence. This in spite of having a reassuring PET scan in October. I have discussed the findings and my concerns with patient and her husband. At this time I have recommended inpatient management and hospitalization to further assess, reduce symptoms, and monitor her rapid progression. She is agreeable but would like to have tonight to spend at home. Due to this wish, despite my advisement otherwise but out of respect of her educated autonomy, I have send in a prescription for lovenox for her to start for the blood clot. We discussed how to use this medication along with red flags.   Dr. Myna Hidalgo has been made aware of her status and testing results. He is planning to round on her in the hospital.   Disposition: Discharged to ER via private vehicle.  RTC 1 week MD only, labs (CBC w/, CMP, LDH)  Rushie Chestnut, PA-C 12/12/20247:33 AM

## 2023-10-05 ENCOUNTER — Other Ambulatory Visit: Payer: Self-pay

## 2023-10-05 ENCOUNTER — Emergency Department (HOSPITAL_COMMUNITY): Payer: Medicaid Other

## 2023-10-05 ENCOUNTER — Inpatient Hospital Stay (HOSPITAL_COMMUNITY)
Admission: EM | Admit: 2023-10-05 | Discharge: 2023-10-12 | DRG: 300 | Disposition: A | Discharge status: Home / Self Care | Payer: Medicaid Other | Attending: Family Medicine | Admitting: Family Medicine

## 2023-10-05 ENCOUNTER — Encounter (HOSPITAL_COMMUNITY): Payer: Self-pay

## 2023-10-05 DIAGNOSIS — Z833 Family history of diabetes mellitus: Secondary | ICD-10-CM | POA: Diagnosis not present

## 2023-10-05 DIAGNOSIS — E1141 Type 2 diabetes mellitus with diabetic mononeuropathy: Secondary | ICD-10-CM | POA: Diagnosis present

## 2023-10-05 DIAGNOSIS — D320 Benign neoplasm of cerebral meninges: Secondary | ICD-10-CM | POA: Diagnosis present

## 2023-10-05 DIAGNOSIS — D329 Benign neoplasm of meninges, unspecified: Secondary | ICD-10-CM | POA: Diagnosis present

## 2023-10-05 DIAGNOSIS — Z7984 Long term (current) use of oral hypoglycemic drugs: Secondary | ICD-10-CM | POA: Diagnosis not present

## 2023-10-05 DIAGNOSIS — I10 Essential (primary) hypertension: Secondary | ICD-10-CM | POA: Diagnosis present

## 2023-10-05 DIAGNOSIS — R591 Generalized enlarged lymph nodes: Secondary | ICD-10-CM

## 2023-10-05 DIAGNOSIS — C50912 Malignant neoplasm of unspecified site of left female breast: Secondary | ICD-10-CM | POA: Diagnosis present

## 2023-10-05 DIAGNOSIS — Z8249 Family history of ischemic heart disease and other diseases of the circulatory system: Secondary | ICD-10-CM

## 2023-10-05 DIAGNOSIS — I82C19 Acute embolism and thrombosis of unspecified internal jugular vein: Secondary | ICD-10-CM | POA: Insufficient documentation

## 2023-10-05 DIAGNOSIS — F17210 Nicotine dependence, cigarettes, uncomplicated: Secondary | ICD-10-CM | POA: Diagnosis present

## 2023-10-05 DIAGNOSIS — F319 Bipolar disorder, unspecified: Secondary | ICD-10-CM | POA: Diagnosis present

## 2023-10-05 DIAGNOSIS — I252 Old myocardial infarction: Secondary | ICD-10-CM

## 2023-10-05 DIAGNOSIS — L03213 Periorbital cellulitis: Secondary | ICD-10-CM | POA: Diagnosis present

## 2023-10-05 DIAGNOSIS — Z86718 Personal history of other venous thrombosis and embolism: Secondary | ICD-10-CM | POA: Diagnosis not present

## 2023-10-05 DIAGNOSIS — Z794 Long term (current) use of insulin: Secondary | ICD-10-CM | POA: Diagnosis not present

## 2023-10-05 DIAGNOSIS — E1165 Type 2 diabetes mellitus with hyperglycemia: Secondary | ICD-10-CM | POA: Diagnosis present

## 2023-10-05 DIAGNOSIS — R600 Localized edema: Secondary | ICD-10-CM

## 2023-10-05 DIAGNOSIS — E785 Hyperlipidemia, unspecified: Secondary | ICD-10-CM | POA: Diagnosis present

## 2023-10-05 DIAGNOSIS — I2510 Atherosclerotic heart disease of native coronary artery without angina pectoris: Secondary | ICD-10-CM | POA: Diagnosis present

## 2023-10-05 DIAGNOSIS — Z923 Personal history of irradiation: Secondary | ICD-10-CM

## 2023-10-05 DIAGNOSIS — Z880 Allergy status to penicillin: Secondary | ICD-10-CM

## 2023-10-05 DIAGNOSIS — Z86011 Personal history of benign neoplasm of the brain: Secondary | ICD-10-CM

## 2023-10-05 DIAGNOSIS — G4730 Sleep apnea, unspecified: Secondary | ICD-10-CM | POA: Diagnosis present

## 2023-10-05 DIAGNOSIS — Z9842 Cataract extraction status, left eye: Secondary | ICD-10-CM

## 2023-10-05 DIAGNOSIS — C50919 Malignant neoplasm of unspecified site of unspecified female breast: Secondary | ICD-10-CM | POA: Diagnosis present

## 2023-10-05 DIAGNOSIS — Z9841 Cataract extraction status, right eye: Secondary | ICD-10-CM

## 2023-10-05 DIAGNOSIS — K5900 Constipation, unspecified: Secondary | ICD-10-CM | POA: Diagnosis present

## 2023-10-05 DIAGNOSIS — R220 Localized swelling, mass and lump, head: Secondary | ICD-10-CM | POA: Diagnosis not present

## 2023-10-05 DIAGNOSIS — Z803 Family history of malignant neoplasm of breast: Secondary | ICD-10-CM

## 2023-10-05 DIAGNOSIS — E669 Obesity, unspecified: Secondary | ICD-10-CM | POA: Diagnosis present

## 2023-10-05 DIAGNOSIS — C770 Secondary and unspecified malignant neoplasm of lymph nodes of head, face and neck: Secondary | ICD-10-CM | POA: Diagnosis present

## 2023-10-05 DIAGNOSIS — Z7901 Long term (current) use of anticoagulants: Secondary | ICD-10-CM

## 2023-10-05 DIAGNOSIS — C50012 Malignant neoplasm of nipple and areola, left female breast: Secondary | ICD-10-CM | POA: Diagnosis not present

## 2023-10-05 DIAGNOSIS — I82C12 Acute embolism and thrombosis of left internal jugular vein: Principal | ICD-10-CM | POA: Diagnosis present

## 2023-10-05 DIAGNOSIS — F419 Anxiety disorder, unspecified: Secondary | ICD-10-CM | POA: Diagnosis present

## 2023-10-05 DIAGNOSIS — E861 Hypovolemia: Secondary | ICD-10-CM | POA: Diagnosis not present

## 2023-10-05 DIAGNOSIS — C50011 Malignant neoplasm of nipple and areola, right female breast: Secondary | ICD-10-CM

## 2023-10-05 DIAGNOSIS — Z888 Allergy status to other drugs, medicaments and biological substances status: Secondary | ICD-10-CM

## 2023-10-05 DIAGNOSIS — Z79899 Other long term (current) drug therapy: Secondary | ICD-10-CM

## 2023-10-05 DIAGNOSIS — Z9221 Personal history of antineoplastic chemotherapy: Secondary | ICD-10-CM | POA: Diagnosis not present

## 2023-10-05 DIAGNOSIS — Z6834 Body mass index (BMI) 34.0-34.9, adult: Secondary | ICD-10-CM

## 2023-10-05 DIAGNOSIS — E66811 Obesity, class 1: Secondary | ICD-10-CM | POA: Diagnosis present

## 2023-10-05 LAB — COMPREHENSIVE METABOLIC PANEL
ALT: 13 U/L (ref 0–44)
AST: 15 U/L (ref 15–41)
Albumin: 3.4 g/dL — ABNORMAL LOW (ref 3.5–5.0)
Alkaline Phosphatase: 89 U/L (ref 38–126)
Anion gap: 9 (ref 5–15)
BUN: 15 mg/dL (ref 6–20)
CO2: 23 mmol/L (ref 22–32)
Calcium: 8.6 mg/dL — ABNORMAL LOW (ref 8.9–10.3)
Chloride: 103 mmol/L (ref 98–111)
Creatinine, Ser: 0.77 mg/dL (ref 0.44–1.00)
GFR, Estimated: >60 mL/min (ref >60)
Glucose, Bld: 286 mg/dL — ABNORMAL HIGH (ref 70–99)
Potassium: 4.2 mmol/L (ref 3.5–5.1)
Sodium: 135 mmol/L (ref 135–145)
Total Bilirubin: 0.4 mg/dL (ref <1.2)
Total Protein: 6.8 g/dL (ref 6.5–8.1)

## 2023-10-05 LAB — URINALYSIS, ROUTINE W REFLEX MICROSCOPIC
Bilirubin Urine: NEGATIVE
Glucose, UA: 150 mg/dL — AB
Hgb urine dipstick: NEGATIVE
Ketones, ur: NEGATIVE mg/dL
Leukocytes,Ua: NEGATIVE
Nitrite: NEGATIVE
Protein, ur: NEGATIVE mg/dL
Specific Gravity, Urine: 1.016 (ref 1.005–1.030)
pH: 5 (ref 5.0–8.0)

## 2023-10-05 LAB — CBC WITH DIFFERENTIAL/PLATELET
Abs Immature Granulocytes: 0.03 10*3/uL (ref 0.00–0.07)
Basophils Absolute: 0 10*3/uL (ref 0.0–0.1)
Basophils Relative: 0 %
Eosinophils Absolute: 0 10*3/uL (ref 0.0–0.5)
Eosinophils Relative: 1 %
HCT: 34.4 % — ABNORMAL LOW (ref 36.0–46.0)
Hemoglobin: 11.5 g/dL — ABNORMAL LOW (ref 12.0–15.0)
Immature Granulocytes: 1 %
Lymphocytes Relative: 21 %
Lymphs Abs: 1.2 10*3/uL (ref 0.7–4.0)
MCH: 31.3 pg (ref 26.0–34.0)
MCHC: 33.4 g/dL (ref 30.0–36.0)
MCV: 93.5 fL (ref 80.0–100.0)
Monocytes Absolute: 0.4 10*3/uL (ref 0.1–1.0)
Monocytes Relative: 7 %
Neutro Abs: 4 10*3/uL (ref 1.7–7.7)
Neutrophils Relative %: 70 %
Platelets: 301 10*3/uL (ref 150–400)
RBC: 3.68 MIL/uL — ABNORMAL LOW (ref 3.87–5.11)
RDW: 15.5 % (ref 11.5–15.5)
WBC: 5.6 10*3/uL (ref 4.0–10.5)
nRBC: 0 % (ref 0.0–0.2)

## 2023-10-05 LAB — PROTIME-INR
INR: 1.1 (ref 0.8–1.2)
Prothrombin Time: 14.2 s (ref 11.4–15.2)

## 2023-10-05 LAB — HEPARIN LEVEL (UNFRACTIONATED)
Heparin Unfractionated: <0.1 [IU]/mL — ABNORMAL LOW (ref 0.30–0.70)
Heparin Unfractionated: 0.47 [IU]/mL (ref 0.30–0.70)

## 2023-10-05 LAB — HIV ANTIBODY (ROUTINE TESTING W REFLEX): HIV Screen 4th Generation wRfx: NONREACTIVE

## 2023-10-05 LAB — GLUCOSE, CAPILLARY
Glucose-Capillary: 256 mg/dL — ABNORMAL HIGH (ref 70–99)
Glucose-Capillary: 285 mg/dL — ABNORMAL HIGH (ref 70–99)

## 2023-10-05 LAB — CBG MONITORING, ED: Glucose-Capillary: 194 mg/dL — ABNORMAL HIGH (ref 70–99)

## 2023-10-05 LAB — APTT: aPTT: 28 s (ref 24–36)

## 2023-10-05 MED ORDER — NICOTINE 21 MG/24HR TD PT24
21.0000 mg | MEDICATED_PATCH | Freq: Every day | TRANSDERMAL | Status: DC
  Administered 2023-10-05 – 2023-10-12 (×8): 21 mg via TRANSDERMAL
  Filled 2023-10-05 (×8): qty 1

## 2023-10-05 MED ORDER — LIDOCAINE HCL URETHRAL/MUCOSAL 2 % EX GEL: 1.0000 | Freq: Once | CUTANEOUS | Status: DC

## 2023-10-05 MED ORDER — HYDROMORPHONE HCL 1 MG/ML IJ SOLN
1.0000 mg | Freq: Once | INTRAMUSCULAR | Status: AC
  Administered 2023-10-05: 1 mg via INTRAVENOUS
  Filled 2023-10-05: qty 1

## 2023-10-05 MED ORDER — HYDROMORPHONE HCL 2 MG/ML IJ SOLN
2.0000 mg | Freq: Once | INTRAMUSCULAR | Status: AC
  Administered 2023-10-05: 2 mg via INTRAVENOUS
  Filled 2023-10-05: qty 1

## 2023-10-05 MED ORDER — DEXAMETHASONE SODIUM PHOSPHATE 10 MG/ML IJ SOLN
8.0000 mg | Freq: Once | INTRAMUSCULAR | Status: AC
  Administered 2023-10-05: 8 mg via INTRAVENOUS
  Filled 2023-10-05: qty 1

## 2023-10-05 MED ORDER — ONDANSETRON HCL 4 MG PO TABS: 4.0000 mg | ORAL_TABLET | Freq: Four times a day (QID) | ORAL | Status: DC | PRN

## 2023-10-05 MED ORDER — ACETAMINOPHEN 325 MG PO TABS
650.0000 mg | ORAL_TABLET | Freq: Four times a day (QID) | ORAL | Status: DC | PRN
  Administered 2023-10-09 – 2023-10-10 (×3): 650 mg via ORAL
  Filled 2023-10-05 (×4): qty 2

## 2023-10-05 MED ORDER — ONDANSETRON HCL 4 MG/2ML IJ SOLN: 4.0000 mg | Freq: Four times a day (QID) | INTRAMUSCULAR | Status: DC | PRN

## 2023-10-05 MED ORDER — HEPARIN (PORCINE) 25000 UT/250ML-% IV SOLN
1300.0000 [IU]/h | INTRAVENOUS | Status: DC
  Administered 2023-10-05: 1250 [IU]/h via INTRAVENOUS
  Administered 2023-10-06 – 2023-10-07 (×2): 1400 [IU]/h via INTRAVENOUS
  Administered 2023-10-07: 1300 [IU]/h via INTRAVENOUS
  Filled 2023-10-05 (×4): qty 250

## 2023-10-05 MED ORDER — ROSUVASTATIN CALCIUM 20 MG PO TABS
40.0000 mg | ORAL_TABLET | Freq: Every day | ORAL | Status: DC
  Administered 2023-10-05 – 2023-10-12 (×8): 40 mg via ORAL
  Filled 2023-10-05 (×8): qty 2

## 2023-10-05 MED ORDER — INSULIN GLARGINE-YFGN 100 UNIT/ML ~~LOC~~ SOLN
20.0000 [IU] | Freq: Every day | SUBCUTANEOUS | Status: DC
  Filled 2023-10-05: qty 0.2

## 2023-10-05 MED ORDER — SENNA 8.6 MG PO TABS
1.0000 | ORAL_TABLET | Freq: Two times a day (BID) | ORAL | Status: DC
  Administered 2023-10-05 – 2023-10-12 (×14): 8.6 mg via ORAL
  Filled 2023-10-05 (×14): qty 1

## 2023-10-05 MED ORDER — INSULIN ASPART 100 UNIT/ML IJ SOLN
0.0000 [IU] | Freq: Every day | INTRAMUSCULAR | Status: DC
  Administered 2023-10-05: 3 [IU] via SUBCUTANEOUS
  Administered 2023-10-06: 2 [IU] via SUBCUTANEOUS
  Administered 2023-10-07: 5 [IU] via SUBCUTANEOUS
  Administered 2023-10-08: 2 [IU] via SUBCUTANEOUS
  Administered 2023-10-10: 5 [IU] via SUBCUTANEOUS
  Administered 2023-10-11: 4 [IU] via SUBCUTANEOUS
  Filled 2023-10-05: qty 0.05

## 2023-10-05 MED ORDER — HEPARIN BOLUS VIA INFUSION
2000.0000 [IU] | Freq: Once | INTRAVENOUS | Status: AC
Start: 2023-10-05 — End: 2023-10-05
  Administered 2023-10-05: 2000 [IU] via INTRAVENOUS
  Filled 2023-10-05: qty 2000

## 2023-10-05 MED ORDER — HYDROMORPHONE HCL 1 MG/ML IJ SOLN
0.5000 mg | INTRAMUSCULAR | Status: DC | PRN
  Administered 2023-10-05 – 2023-10-06 (×9): 1 mg via INTRAVENOUS
  Filled 2023-10-05 (×9): qty 1

## 2023-10-05 MED ORDER — DEXAMETHASONE SODIUM PHOSPHATE 10 MG/ML IJ SOLN
8.0000 mg | INTRAMUSCULAR | Status: DC
Start: 2023-10-06 — End: 2023-10-08
  Administered 2023-10-06 – 2023-10-07 (×2): 8 mg via INTRAVENOUS
  Filled 2023-10-05 (×2): qty 1

## 2023-10-05 MED ORDER — INSULIN ASPART 100 UNIT/ML IJ SOLN
0.0000 [IU] | Freq: Three times a day (TID) | INTRAMUSCULAR | Status: DC
  Administered 2023-10-05: 4 [IU] via SUBCUTANEOUS
  Administered 2023-10-05: 11 [IU] via SUBCUTANEOUS
  Administered 2023-10-06: 7 [IU] via SUBCUTANEOUS
  Administered 2023-10-06: 4 [IU] via SUBCUTANEOUS
  Administered 2023-10-06 – 2023-10-07 (×2): 11 [IU] via SUBCUTANEOUS
  Administered 2023-10-07: 15 [IU] via SUBCUTANEOUS
  Administered 2023-10-07: 11 [IU] via SUBCUTANEOUS
  Administered 2023-10-08: 20 [IU] via SUBCUTANEOUS
  Administered 2023-10-08: 11 [IU] via SUBCUTANEOUS
  Administered 2023-10-08: 7 [IU] via SUBCUTANEOUS
  Administered 2023-10-09: 20 [IU] via SUBCUTANEOUS
  Administered 2023-10-09: 3 [IU] via SUBCUTANEOUS
  Administered 2023-10-09: 11 [IU] via SUBCUTANEOUS
  Administered 2023-10-10 – 2023-10-11 (×4): 15 [IU] via SUBCUTANEOUS
  Administered 2023-10-11: 4 [IU] via SUBCUTANEOUS
  Administered 2023-10-12 (×2): 11 [IU] via SUBCUTANEOUS
  Administered 2023-10-12: 7 [IU] via SUBCUTANEOUS
  Filled 2023-10-05: qty 0.2

## 2023-10-05 MED ORDER — SENNOSIDES-DOCUSATE SODIUM 8.6-50 MG PO TABS: 1.0000 | ORAL_TABLET | Freq: Every evening | ORAL | Status: DC | PRN

## 2023-10-05 MED ORDER — METOPROLOL TARTRATE 25 MG PO TABS
25.0000 mg | ORAL_TABLET | Freq: Two times a day (BID) | ORAL | Status: DC
  Administered 2023-10-05 – 2023-10-12 (×14): 25 mg via ORAL
  Filled 2023-10-05 (×14): qty 1

## 2023-10-05 MED ORDER — OXYCODONE HCL 5 MG PO TABS
5.0000 mg | ORAL_TABLET | ORAL | Status: DC | PRN
Start: 2023-10-05 — End: 2023-10-12
  Administered 2023-10-05 – 2023-10-12 (×12): 5 mg via ORAL
  Filled 2023-10-05 (×15): qty 1

## 2023-10-05 MED ORDER — ACETAMINOPHEN 650 MG RE SUPP: 650.0000 mg | Freq: Four times a day (QID) | RECTAL | Status: DC | PRN

## 2023-10-05 MED ORDER — FENOFIBRATE 160 MG PO TABS: 160.0000 mg | ORAL_TABLET | Freq: Every day | ORAL | Status: DC

## 2023-10-05 MED ORDER — INSULIN GLARGINE-YFGN 100 UNIT/ML ~~LOC~~ SOLN
20.0000 [IU] | Freq: Every day | SUBCUTANEOUS | Status: DC
  Administered 2023-10-05 – 2023-10-07 (×3): 20 [IU] via SUBCUTANEOUS
  Filled 2023-10-05 (×3): qty 0.2

## 2023-10-05 MED ORDER — GADOBUTROL 1 MMOL/ML IV SOLN
10.0000 mL | Freq: Once | INTRAVENOUS | Status: AC | PRN
Start: 2023-10-05 — End: 2023-10-05
  Administered 2023-10-05: 10 mL via INTRAVENOUS

## 2023-10-05 NOTE — ED Triage Notes (Signed)
Pt arrives via POV. She states she saw her cancer doctor yesterday, was told she was informed she needed to be admitted for a new work up related to a finding of a possible meningioma. Pt reports she has been experiencing ongoing swelling to the left side of her neck and left eye. She states she was told she has a blood clot in her neck as well, she took her first dose of Lovenox last night. States the pain in her neck and left eye has worsened. Denies cp or sob. Pt arrives AxOx4.

## 2023-10-05 NOTE — H&P (Addendum)
History and Physical    Patient: Ariana Herrera ZOX:[id number] DOB: [birth year] DOA: 10/05/2023 DOS: the patient was seen and examined on 10/05/2023 PCP: Ariana Ferguson, PA-C  Patient coming from: Home  Chief Complaint:  Chief Complaint  Patient presents with   Neck Pain   Facial Swelling    HPI: Ariana Herrera is a 57 y.o. female with medical history significant of infiltrating ductal carcinoma of the left breast, triple negative recurrent under care of Dr. Myna Hidalgo, patient had a recent MRI 09/03/2023 which showed 7 mm dural based lesion that favored to be a meningioma versus dural metastasis.  She started to have swelling of the left neck for several weeks, PET scan was performed couple weeks ago and was unremarkable.  She saw oncology 12/10 and a CT neck soft tissue was ordered which showed increased density in the left supraclavicular region worrisome for local recurrence of malignancy, thrombosis of the left jugular vein associated with edema and left face and neck.  Nodal mass up to 2 cm.  Direct tumor invasion involvement of the sternocleidomastoid muscle.  The left parotid gland intraparotid node could be malignant.  He was started on Lovenox by her oncology on 12/10.  Patient presented with worsening swelling of her face and her neck unable to open her eye.  Evaluation in the ED: MRI neck showed centrally necrotic left level 4 lymph node measuring up to 21 x 17 mm consistent with nodal metastatic disease.  Thickening and enhancement of the left sternocleidal vascular muscle likely reflect direct invasion with tumor.Confluent enhancement along the left carotid sheath throughout the left level 2, 3 and 4 stations is favored to reflect nodal metastatic disease with extracapsular extension. Obstruction of the left internal jugular vein at the level of the necrotic left level 4 lymph node with bland thrombus extending cranially to the level of the larynx. Edema/fluid in the superficial  left face/neck, as well as the left parapharyngeal and retropharyngeal spaces, likely secondary to venous and/or lymphatic obstruction.  Patient was started on heparin drip, IV steroid given.  Review of Systems: As mentioned in the history of present illness. All other systems reviewed and are negative. Past Medical History:  Diagnosis Date   Anginal pain (HCC)    Anxiety    Arthritis    Asthma    Bipolar 1 disorder (HCC)    Bipolar disease, chronic (HCC)    Breast cancer in female (HCC)    2019 and recurrent in 2023 to right breast, right axillary, and left neck   Coronary artery disease    Deep vein blood clot of right lower extremity (HCC)    Diabetes mellitus type 2 in obese    Goals of care, counseling/discussion 11/23/2021   Hyperlipidemia    Lymphedema    MI (myocardial infarction) (HCC)    was in [place]   Obesity    Pancreatitis    Personal history of chemotherapy    Personal history of radiation therapy    Sleep apnea    Past Surgical History:  Procedure Laterality Date   BREAST BIOPSY Left 10/03/2023   times 2   BREAST LUMPECTOMY Left 03/2019   CATARACT EXTRACTION Bilateral    CHOLECYSTECTOMY     IR IMAGING GUIDED PORT INSERTION  12/15/2021   IR REMOVAL TUN ACCESS W/ PORT W/O FL MOD SED  12/20/2019   IR US GUIDE BX ASP/DRAIN  11/02/2021   LEFT HEART CATH AND CORONARY ANGIOGRAPHY N/A 08/22/2020   Procedure: LEFT HEART CATH  AND CORONARY ANGIOGRAPHY;  Surgeon: Kathleene Hazel, MD;  Location: [facility];  Service: Cardiovascular;  Laterality: N/A;   TRIGGER FINGER RELEASE Bilateral    x 5   TUBAL LIGATION     UMBILICAL HERNIA REPAIR     x 2   uterine ablation     Social History:  reports that she has been smoking cigarettes. She started smoking about 38 years ago. She has a 38.9 pack-year smoking history. She has never used smokeless tobacco. She reports current drug use. Drug: Marijuana. She reports that she does not drink  alcohol.  Allergies  Allergen Reactions   Lithium Other (See Comments)    Spinal fluid built up in brain   Dulaglutide Nausea And Vomiting and Other (See Comments)   Penicillins Other (See Comments)    UNKNOWN CHILDHOOD REACTION    Family History  Problem Relation Age of Onset   Heart attack Mother 19   Diabetes Mother    Breast cancer Mother    Breast cancer Cousin    Colon cancer Neg Hx    Esophageal cancer Neg Hx    Pancreatic cancer Neg Hx    Stomach cancer Neg Hx     Prior to Admission medications   Medication Sig Start Date End Date Taking? Authorizing Provider  ACCU-CHEK GUIDE test strip 3 (three) times daily. 07/17/21   [provider]  albuterol (ACCUNEB) 1.25 MG/3ML nebulizer solution Take 1 ampule by nebulization 4 (four) times daily as needed for wheezing or shortness of breath. 02/27/23   [provider]  albuterol (VENTOLIN HFA) 108 (90 Base) MCG/ACT inhaler Inhale 2 puffs into the lungs every 6 (six) hours as needed for wheezing or shortness of breath. 06/23/23   Drubel, Lillia Abed, PA-C  Continuous Glucose Sensor (FREESTYLE LIBRE 3 SENSOR) MISC Apply 1 sensor every 14 (fourteen) days. 09/19/23     diclofenac (VOLTAREN) 75 MG EC tablet Take 1 tablet (75 mg total) by mouth 2 (two) times daily. 04/05/23   Hudnall, Azucena Fallen, MD  enoxaparin (LOVENOX) 40 MG/0.4ML injection Inject 1 mL (100 mg total) into the skin every 12 (twelve) hours. 10/04/23 11/03/23  Rushie Chestnut, PA-C  fenofibrate (TRICOR) 145 MG tablet Take 145 mg by mouth daily. 11/22/21   [provider]  HYDROcodone-acetaminophen (NORCO) 7.5-325 MG tablet Take 1 tablet by mouth every 6 (six) hours as needed for moderate pain (pain score 4-6). 09/24/23   [name], MD  insulin aspart (NOVOLOG FLEXPEN) 100 UNIT/ML FlexPen Inject 3 Units into the skin 3 (three) times daily with meals. Patient taking differently: Inject 10-16 Units into the skin 3 (three) times daily with meals. 08/25/20    Calvert Cantor, MD  insulin degludec (TRESIBA FLEXTOUCH) 100 UNIT/ML FlexTouch Pen Inject 25 Units into the skin daily. 02/10/23   [provider]  linaclotide Karlene Einstein) 290 MCG CAPS capsule Take 1 capsule (290 mcg total) by mouth daily before breakfast. 05/13/23   Imogene Burn, MD  loperamide (IMODIUM) 2 MG capsule 2 mg as needed.    [provider]  metFORMIN (GLUCOPHAGE) 1000 MG tablet Take 1,000 mg by mouth 2 (two) times daily with a meal. 11/26/19   [provider]  metoprolol tartrate (LOPRESSOR) 25 MG tablet Take 1 tablet (25 mg total) by mouth 2 (two) times daily. 09/20/23 12/19/23  Jonita Albee, PA-C  nitroGLYCERIN (NITROSTAT) 0.4 MG SL tablet Place 1 tablet (0.4 mg total) under the tongue every 5 (five) minutes as needed  for chest pain. Patient not taking: Reported on 10/04/2023 09/04/20   Ronney Asters, NP  omeprazole (PRILOSEC) 40 MG capsule Take 40 mg by mouth 2 (two) times daily. 06/22/23   [provider]  ondansetron (ZOFRAN) 8 MG tablet TAKE 1 TAB BY MOUTH EVERY 8 HOURS AS NEEDED FOR NAUSEA OR VOMITING. START ON THE THIRD DAY AFTER CHEMOTHERAPY. 06/22/23   [name], MD  oxybutynin (DITROPAN-XL) 5 MG 24 hr tablet Take 1 tablet (5 mg total) by mouth at bedtime. 06/23/23   Ariana Ferguson, PA-C  QUEtiapine (SEROQUEL) 400 MG tablet Take 800 mg by mouth at bedtime.    [provider]  rosuvastatin (CRESTOR) 40 MG tablet Take 1 tablet (40 mg total) by mouth daily. 09/29/23 12/28/23  Jonita Albee, PA-C    Physical Exam: Vitals:   10/05/23 0937 10/05/23 0938 10/05/23 1115  BP: 126/77  103/64  Pulse: (!) 115  (!) 106  Resp: 18  18  Temp: 98.3 F (36.8 C)    SpO2: 98%  94%  Weight:  87.1 kg   Height:  5\' 2"  (1.575 m)    General; NAD HENT: Facial swelling worse left side, some improvement since she has been in the ED>  CVS: S 1, S 2  RRR Lungs: Normal respiratory effort. CTA Abdomen: BS present, soft, nt Extremities. No  edema Neuro: Alert, conversant, speech clear, follows command, no weakness  Data Reviewed:  Labs reviewed.   Assessment and Plan: No notes have been filed under this hospital service. Service: Hospitalist  1-Left internal jugular vein obstruction at the level of necrotic lymph nodes with bland thrombus extending cranially to the level of the larynx.  Necrotic lymph nodes neck In the setting of metastatic breast cancer Case was discussed with Dr. Myna Hidalgo who recommended heparin drip and daily dose of IV Decadron. He will see patient in consultation will consider radiation oncology consultation IV dilaudid and oxy for pain management   2-7 mm dural based enhancing lesion along the left medial frontal gyrus, favored to reflect a small meningioma; -Follow Dr. Myna Hidalgo recommendation  3-Diabetes type 2 with hyperglycemia uncontrolled Suspect will get worsening hyperglycemia in the setting of IV Decadron Start Semglee 20 units daily, start sliding scale insulin  Recurrent left ductal carcinoma of breast cancer;  Followed by Dr. Myna Hidalgo now with concern of metastatic disease to the neck Dr. Myna Hidalgo consulted  Hyperlipidemia: continue fenobrite. Start Crestor as advised by her cardiology   Hypertension: started on metoprolol by her cardiology    Advance Care Planning:   Code Status: Prior wishes to be full code.  Consults: DR Myna Hidalgo.   Family Communication: care discussed with patient   Severity of Illness: The appropriate patient status for this patient is INPATIENT. Inpatient status is judged to be reasonable and necessary in order to provide the required intensity of service to ensure the patient's safety. The patient's presenting symptoms, physical exam findings, and initial radiographic and laboratory data in the context of their chronic comorbidities is felt to place them at high risk for further clinical deterioration. Furthermore, it is not anticipated that the patient will be  medically stable for discharge from the hospital within 2 midnights of admission.   * I certify that at the point of admission it is my clinical judgment that the patient will require inpatient hospital care spanning beyond 2 midnights from the point of admission due to high intensity of service, high risk for further deterioration and high frequency of  surveillance required.*  Author: Alba Cory, MD 10/05/2023 2:04 PM  For on call review [email or web address].

## 2023-10-05 NOTE — ED Provider Notes (Signed)
[facility] Provider Note   CSN: [id number] Arrival date & time: 10/05/23  0865     History  Chief Complaint  Patient presents with   Neck Pain   Facial Swelling    Ariana Herrera is a 57 y.o. female, hx of recurrent breast cancer, managed by Dr. Myna Hidalgo, who presents to the ED secondary to left-sided facial swelling, this been going on for the last week.  She states he got markedly worse, yesterday, and she went for CT scan, ordered by her oncology PA, and found out that she had metastatic cancer, to her neck, and she also states that there is concern for a meningioma, versus brain met, to her brain.  She states the swelling is getting markedly worse, and she has been crying all night, because of the cancer, and her face is very swollen now.  Cannot hardly see out of left eye currently.  Denies any fevers or chills.  No weakness on one side of the body.  Home Medications Prior to Admission medications   Medication Sig Start Date End Date Taking? Authorizing Provider  ACCU-CHEK GUIDE test strip 3 (three) times daily. 07/17/21   [provider]  albuterol (ACCUNEB) 1.25 MG/3ML nebulizer solution Take 1 ampule by nebulization 4 (four) times daily as needed for wheezing or shortness of breath. 02/27/23   [provider]  albuterol (VENTOLIN HFA) 108 (90 Base) MCG/ACT inhaler Inhale 2 puffs into the lungs every 6 (six) hours as needed for wheezing or shortness of breath. 06/23/23   Drubel, Lillia Abed, PA-C  Continuous Glucose Sensor (FREESTYLE LIBRE 3 SENSOR) MISC Apply 1 sensor every 14 (fourteen) days. 09/19/23     diclofenac (VOLTAREN) 75 MG EC tablet Take 1 tablet (75 mg total) by mouth 2 (two) times daily. 04/05/23   Hudnall, Azucena Fallen, MD  enoxaparin (LOVENOX) 40 MG/0.4ML injection Inject 1 mL (100 mg total) into the skin every 12 (twelve) hours. 10/04/23 11/03/23  Rushie Chestnut, PA-C  fenofibrate (TRICOR) 145 MG tablet Take 145  mg by mouth daily. 11/22/21   [provider]  HYDROcodone-acetaminophen (NORCO) 7.5-325 MG tablet Take 1 tablet by mouth every 6 (six) hours as needed for moderate pain (pain score 4-6). 09/24/23   [name], MD  insulin aspart (NOVOLOG FLEXPEN) 100 UNIT/ML FlexPen Inject 3 Units into the skin 3 (three) times daily with meals. Patient taking differently: Inject 10-16 Units into the skin 3 (three) times daily with meals. 08/25/20   Calvert Cantor, MD  insulin degludec (TRESIBA FLEXTOUCH) 100 UNIT/ML FlexTouch Pen Inject 25 Units into the skin daily. 02/10/23   [provider]  linaclotide Karlene Einstein) 290 MCG CAPS capsule Take 1 capsule (290 mcg total) by mouth daily before breakfast. 05/13/23   Imogene Burn, MD  loperamide (IMODIUM) 2 MG capsule 2 mg as needed.    [provider]  metFORMIN (GLUCOPHAGE) 1000 MG tablet Take 1,000 mg by mouth 2 (two) times daily with a meal. 11/26/19   [provider]  metoprolol tartrate (LOPRESSOR) 25 MG tablet Take 1 tablet (25 mg total) by mouth 2 (two) times daily. 09/20/23 12/19/23  Jonita Albee, PA-C  nitroGLYCERIN (NITROSTAT) 0.4 MG SL tablet Place 1 tablet (0.4 mg total) under the tongue every 5 (five) minutes as needed for chest pain. Patient not taking: Reported on 10/04/2023 09/04/20   Ronney Asters, NP  omeprazole (PRILOSEC) 40 MG capsule Take 40 mg by mouth 2 (two) times  daily. 06/22/23   [provider]  ondansetron (ZOFRAN) 8 MG tablet TAKE 1 TAB BY MOUTH EVERY 8 HOURS AS NEEDED FOR NAUSEA OR VOMITING. START ON THE THIRD DAY AFTER CHEMOTHERAPY. 06/22/23   [name], MD  oxybutynin (DITROPAN-XL) 5 MG 24 hr tablet Take 1 tablet (5 mg total) by mouth at bedtime. 06/23/23   Alfredia Ferguson, PA-C  QUEtiapine (SEROQUEL) 400 MG tablet Take 800 mg by mouth at bedtime.    [provider]  rosuvastatin (CRESTOR) 40 MG tablet Take 1 tablet (40 mg total) by mouth daily. 09/29/23 12/28/23  Jonita Albee, PA-C      Allergies    Lithium, Dulaglutide, and Penicillins    Review of Systems   Review of Systems  Musculoskeletal:  Positive for neck pain.    Physical Exam Updated Vital Signs BP 103/64   Pulse (!) 106   Temp 98.3 F (36.8 C)   Resp 18   Ht 5\' 2"  (1.575 m)   Wt 87.1 kg   LMP 10/04/2016 Comment: after "procedure to stop bleeding"  SpO2 94%   BMI 35.12 kg/m  Physical Exam Vitals and nursing note reviewed.  Constitutional:      Appearance: She is well-developed.  HENT:     Head: Normocephalic and atraumatic.     Comments: Diffuse edema, along left side of face, especially to L orbit with difficulty opening eye.  Edema to left side of neck, and jaw.  Uvula midline, nonlabored respirations. Eyes:     Conjunctiva/sclera: Conjunctivae normal.  Cardiovascular:     Rate and Rhythm: Normal rate and regular rhythm.     Heart sounds: No murmur heard. Pulmonary:     Effort: Pulmonary effort is normal. No respiratory distress.     Breath sounds: Normal breath sounds.  Abdominal:     Palpations: Abdomen is soft.     Tenderness: There is no abdominal tenderness.  Musculoskeletal:        General: No swelling.     Cervical back: Neck supple.  Skin:    General: Skin is warm and dry.     Capillary Refill: Capillary refill takes less than 2 seconds.  Neurological:     Mental Status: She is alert.  Psychiatric:        Mood and Affect: Mood normal.     ED Results / Procedures / Treatments   Labs (all labs ordered are listed, but only abnormal results are displayed) Labs Reviewed  CBC WITH DIFFERENTIAL/PLATELET - Abnormal; Notable for the following components:      Result Value   RBC 3.68 (*)    Hemoglobin 11.5 (*)    HCT 34.4 (*)    All other components within normal limits  COMPREHENSIVE METABOLIC PANEL - Abnormal; Notable for the following components:   Glucose, Bld 286 (*)    Calcium 8.6 (*)    Albumin 3.4 (*)    All other components within normal  limits  HEPARIN LEVEL (UNFRACTIONATED) - Abnormal; Notable for the following components:   Heparin Unfractionated <0.10 (*)    All other components within normal limits  PROTIME-INR  APTT    EKG None  Radiology MR Neck Soft Tissue Only W or Wo Contrast  Result Date: 10/05/2023 CLINICAL DATA:  Neck mass, nonpulsatile.  Recurrent breast cancer. EXAM: MRI OF THE NECK WITH CONTRAST TECHNIQUE: Multiplanar, multisequence MR imaging was performed following the administration of intravenous contrast. CONTRAST:  10mL GADAVIST GADOBUTROL 1 MMOL/ML IV SOLN COMPARISON:  Neck  CT 09/29/2023. FINDINGS: Pharynx and larynx: No intraluminal mass or submucosal edema. Edema/fluid in the left parapharyngeal and retropharyngeal spaces, likely secondary to venous and/or lymphatic obstruction. Salivary glands: Asymmetric edema and hyperenhancement of the left parotid gland, likely secondary to venous and/or lymphatic obstruction. Thyroid: Normal. Lymph nodes: Centrally necrotic left level 4 lymph node measures up to 21 x 17 mm (axial image 44 series 13). Ill-defined soft tissue signal and enhancement extending along the entirety of left level 5 may represent metastatic disease in the lymph nodes with extracapsular extension. Thickening and enhancement of the left sternoclavicular muscle likely reflects direct invasion with tumor. Confluent enhancement along the left carotid sheath throughout the left level 2, 3 and 4 stations is favored to reflect nodal metastatic disease with extracapsular extension. Vascular: Obstruction of the left internal jugular vein at the level of the necrotic left level 4 lymph node with bland thrombus extending cranially to the level of the larynx (axial image 41 series 11 and 13). Limited intracranial: Unremarkable. Visualized orbits: Periorbital edema in the left, likely secondary to venous and/or lymphatic obstruction. Mastoids and visualized paranasal sinuses: Well aerated. Skeleton: Edema in  the right-greater-than-left dorsal paraspinal muscles along the lower cervical spine is favored to reflect muscle strain. Upper chest: Unremarkable. Other: None. IMPRESSION: 1. Centrally necrotic left level 4 lymph node measures up to 21 x 17 mm, consistent with nodal metastatic disease. Ill-defined soft tissue signal and enhancement extending along the entirety of left level 5 may represent metastatic disease in the lymph nodes with extracapsular extension. Thickening and enhancement of the left sternoclavicular muscle likely reflects direct invasion with tumor. Confluent enhancement along the left carotid sheath throughout the left level 2, 3 and 4 stations is favored to reflect nodal metastatic disease with extracapsular extension. 2. Obstruction of the left internal jugular vein at the level of the necrotic left level 4 lymph node with bland thrombus extending cranially to the level of the larynx. 3. Edema/fluid in the superficial left face/neck, as well as the left parapharyngeal and retropharyngeal spaces, likely secondary to venous and/or lymphatic obstruction. 4. Edema in the right-greater-than-left dorsal paraspinal muscles along the lower cervical spine is favored to reflect muscle strain. Electronically Signed   By: Orvan Falconer M.D.   On: 10/05/2023 13:39   MR Brain W and Wo Contrast  Result Date: 10/05/2023 CLINICAL DATA:  Brain/CNS neoplasm, staging. Recurrent breast cancer. EXAM: MRI HEAD WITHOUT AND WITH CONTRAST TECHNIQUE: Multiplanar, multiecho pulse sequences of the brain and surrounding structures were obtained without and with intravenous contrast. CONTRAST:  10mL GADAVIST GADOBUTROL 1 MMOL/ML IV SOLN COMPARISON:  Brain MRI 09/03/2023. 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 No new foci of abnormal intracranial enhancement. No acute infarct or hemorrhage. No hydrocephalus or extra-axial  collection. Vascular: Normal flow voids and vessel enhancement. Skull and upper cervical spine: Normal marrow signal and enhancement. Sinuses/Orbits: No acute findings. Other: None. IMPRESSION: 1. No evidence of intracranial metastatic disease. 2. 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 Electronically Signed   By: Orvan Falconer M.D.   On: 10/05/2023 13:08    Procedures Procedures    Medications Ordered in ED Medications  lidocaine (XYLOCAINE) 2 % jelly 1 Application (1 Application Topical Not Given 10/05/23 1134)  heparin ADULT infusion 100 units/mL (25000 units/257mL) (1,250 Units/hr Intravenous New Bag/Given 10/05/23 1238)  HYDROmorphone (  DILAUDID) injection 1 mg (1 mg Intravenous Given 10/05/23 1017)  heparin bolus via infusion 2,000 Units (2,000 Units Intravenous Bolus from Bag 10/05/23 1239)  gadobutrol (GADAVIST) 1 MMOL/ML injection 10 mL (10 mLs Intravenous Contrast Given 10/05/23 1218)  HYDROmorphone (DILAUDID) injection 2 mg (2 mg Intravenous Given 10/05/23 1312)  dexamethasone (DECADRON) injection 8 mg (8 mg Intravenous Given 10/05/23 1304)    ED Course/ Medical Decision Making/ A&P                                 Medical Decision Making Patient is a 57 year old female, here with an IJV thrombus, found yesterday, by CTA head/neck, ordered by oncology PA for left-sided facial swelling has been going on for the last week.  Found to have a likely mass in her muscle, and necrotic lymph nodes, as well as IJV thrombus, causing swelling.  Recommended to have an MRI neck.  I reached out to Dr. Myna Hidalgo over, and the physician assistant, working with him.  They recommend an MRI brain, to check on the status of possible meningioma versus met and an MRI neck, as well as starting patient on heparin without bolus.  I placed heparin request as well as ordered MRI neck, and brain.  Will start patient on  dexamethasone given diffuse swelling of the left side of the face, she has no respiratory distress at this time  Amount and/or Complexity of Data Reviewed Labs: ordered.    Details: Labs unremarkable Radiology: ordered.    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  MRI of the neck, shows necrotic lymph nodes, mass in the SCM, as well as retropharyngeal, appearing parapharyngeal swelling of the left side. Discussion of management or test interpretation with external provider(s): Patient to have thrombus, obstructing, flow through the IJV as well as multiple masses, necrotic lymph nodes, and swelling noted.  She is given dexamethasone, with some improvement of her swelling.  Started on heparin without a bolus per Dr. Myna Hidalgo request.  I spoke to Dr. Sunnie Nielsen who accepts admission of the patient.  Dr. Myna Hidalgo to round tonight on patient.  She is maintaining her airway at this time, and swelling has improved, hospitalist and oncology to further manage  Risk Prescription drug management. Decision regarding hospitalization.   Final Clinical Impression(s) / ED Diagnoses Final diagnoses:  Acute embolism and thrombosis of left internal jugular vein (HCC)  Facial edema  History of meningioma of the brain  Lymphadenopathy    Rx / DC Orders ED Discharge Orders     None         Sandie Swayze Elbert Ewings, PA 10/05/23 1414    Coral Spikes, DO 10/05/23 1539

## 2023-10-05 NOTE — Progress Notes (Signed)
PHARMACY - ANTICOAGULATION CONSULT NOTE  Pharmacy Consult for IV heparin Indication: DVT  Allergies  Allergen Reactions   Lithium Other (See Comments)    Spinal fluid built up in brain   Dulaglutide Nausea And Vomiting and Other (See Comments)   Penicillins Other (See Comments)    UNKNOWN CHILDHOOD REACTION    Patient Measurements: Height: 5\' 2"  (157.5 cm) Weight: 87.1 kg (192 lb) IBW/kg (Calculated) : 50.1 Heparin Dosing Weight: 70 kg  Vital Signs: Temp: 98.3 F (36.8 C) (12/11 0937) BP: 126/77 (12/11 0937) Pulse Rate: 115 (12/11 0937)  Labs: Recent Labs    10/05/23 1026  HGB 11.5*  HCT 34.4*  PLT 301    Estimated Creatinine Clearance: 82.4 mL/min (by C-G formula based on SCr of 0.76 mg/dL).   Medical History: Past Medical History:  Diagnosis Date   Anginal pain (HCC)    Anxiety    Arthritis    Asthma    Bipolar 1 disorder (HCC)    Bipolar disease, chronic (HCC)    Breast cancer in female (HCC)    2019 and recurrent in 2023 to right breast, right axillary, and left neck   Coronary artery disease    Deep vein blood clot of right lower extremity (HCC)    Diabetes mellitus type 2 in obese    Goals of care, counseling/discussion 11/23/2021   Hyperlipidemia    Lymphedema    MI (myocardial infarction) (HCC)    was in [place]   Obesity    Pancreatitis    Personal history of chemotherapy    Personal history of radiation therapy    Sleep apnea     Medications:  Prescribed Lovenox 40 mg subcutaneous q12h yesterday, Last dose 12/10 evening  Assessment: Pharmacy consulted to dose IV heparin for this 57 yo female with a past medical history of CAD, type 2 DM, HLD, asthma, breast cancer. Arrived today to ED stating she saw her cancer doctor yesterday, was told she was informed she needed to be admitted for a new work up related to a finding of a possible meningioma. Pt reports she has been experiencing ongoing swelling to the left side of her neck and left  eye. She states she was told she has a blood clot in her neck as well, she took her first dose of Lovenox last night.   Imaging: 12/5 CT soft tissue neck: thrombosis of the left jugular vein which is associated with edema of the left face and neck  Goal of Therapy:  Heparin level 0.3-0.7 units/ml Monitor platelets by anticoagulation protocol: Yes   Plan:  Baseline aPTT and PT/INR ordered Initiate heparin with 2000 units IV bolus via infusion then 1250 units/hr IV continuous infusion Obtain 6 hour heparin level Monitor daily heparin level, CBC, signs/symptoms of bleeding   Thank you for allowing pharmacy to be a part of this patient's care.  Selinda Eon, PharmD, BCPS Clinical Pharmacist [place] 10/05/2023 11:13 AM

## 2023-10-05 NOTE — Progress Notes (Signed)
PHARMACY - ANTICOAGULATION CONSULT NOTE  Pharmacy Consult for IV heparin Indication: DVT  Allergies  Allergen Reactions   Lithium Other (See Comments)    Spinal fluid built up in brain   Dulaglutide Nausea And Vomiting and Other (See Comments)    TRULICITY   Penicillins Other (See Comments)    UNKNOWN CHILDHOOD REACTION    Patient Measurements: Height: 5\' 2"  (157.5 cm) Weight: 84.5 kg (186 lb 4.6 oz) IBW/kg (Calculated) : 50.1 Heparin Dosing Weight: 70 kg  Vital Signs: Temp: 98.5 F (36.9 C) (12/11 1717) Temp Source: Oral (12/11 1717) BP: 144/84 (12/11 1717) Pulse Rate: 105 (12/11 1717)  Labs: Recent Labs    10/05/23 1026 10/05/23 1122 10/05/23 1736  HGB 11.5*  --   --   HCT 34.4*  --   --   PLT 301  --   --   APTT  --  28  --   LABPROT  --  14.2  --   INR  --  1.1  --   HEPARINUNFRC  --  <0.10* 0.47  CREATININE 0.77  --   --     Estimated Creatinine Clearance: 81.1 mL/min (by C-G formula based on SCr of 0.77 mg/dL).   Medical History: Past Medical History:  Diagnosis Date   Anginal pain (HCC)    Anxiety    Arthritis    Asthma    Bipolar 1 disorder (HCC)    Bipolar disease, chronic (HCC)    Breast cancer in female (HCC)    2019 and recurrent in 2023 to right breast, right axillary, and left neck   Coronary artery disease    Deep vein blood clot of right lower extremity (HCC)    Diabetes mellitus type 2 in obese    Goals of care, counseling/discussion 11/23/2021   Hyperlipidemia    Lymphedema    MI (myocardial infarction) (HCC)    was in [place]   Obesity    Pancreatitis    Personal history of chemotherapy    Personal history of radiation therapy    Sleep apnea     Medications:  Prescribed Lovenox 40 mg subcutaneous q12h yesterday, Last dose 12/10 evening  Assessment: Pharmacy consulted to dose IV heparin for this 57 yo female with a past medical history of CAD, type 2 DM, HLD, asthma, breast cancer. Arrived today to ED stating she saw  her cancer doctor yesterday, was told she was informed she needed to be admitted for a new work up related to a finding of a possible meningioma. Pt reports she has been experiencing ongoing swelling to the left side of her neck and left eye. She states she was told she has a blood clot in her neck as well, she took her first dose of Lovenox last night.   Imaging: 12/5 CT soft tissue neck: thrombosis of the left jugular vein which is associated with edema of the left face and neck  Heparin level therapeutic on current IV heparin rate of 1250 units/hr No reported bleeding or issues  Goal of Therapy:  Heparin level 0.3-0.7 units/ml Monitor platelets by anticoagulation protocol: Yes   Plan:  Continue IV heparin at current rate of 1250 units/hr  Obtain 6 hour heparin level as confirmatory level Monitor daily heparin level, CBC, signs/symptoms of bleeding   Thank you for allowing pharmacy to be a part of this patient's care.  Hessie Knows, PharmD, BCPS Secure Chat if ?s 10/05/2023 6:25 PM

## 2023-10-05 NOTE — ED Notes (Signed)
ED TO INPATIENT HANDOFF REPORT  ED Nurse Name and Phone #: Tia, RN   S Name/Age/Gender Ariana Herrera 57 y.o. female Room/Bed: WA25/WA25  Code Status   Code Status: Full Code  Home/SNF/Other Home Patient oriented to: self, place, time, and situation Is this baseline? Yes   Triage Complete: Triage complete  Chief Complaint Facial swelling [R22.0]  Triage Note Pt arrives via POV. She states she saw her cancer doctor yesterday, was told she was informed she needed to be admitted for a new work up related to a finding of a possible meningioma. Pt reports she has been experiencing ongoing swelling to the left side of her neck and left eye. She states she was told she has a blood clot in her neck as well, she took her first dose of Lovenox last night. States the pain in her neck and left eye has worsened. Denies cp or sob. Pt arrives AxOx4.   Allergies Allergies  Allergen Reactions   Lithium Other (See Comments)    Spinal fluid built up in brain   Dulaglutide Nausea And Vomiting and Other (See Comments)    TRULICITY   Penicillins Other (See Comments)    UNKNOWN CHILDHOOD REACTION    Level of Care/Admitting Diagnosis ED Disposition     ED Disposition  Admit   Condition  --   Comment  Hospital Area: [facility] [[id number]]  Level of Care: Telemetry [5]  Admit to tele based on following criteria: Monitor QTC interval  May admit patient to [facility] or [facility] if equivalent level of care is available:: No  Covid Evaluation: Asymptomatic - no recent exposure (last 10 days) testing not required  Diagnosis: Facial swelling [161096]  Admitting Physician: Alba Cory [phone]  Attending Physician: Alba Cory [phone]  Certification:: I certify this patient will need inpatient services for at least 2 midnights  Expected Medical Readiness: 10/08/2023          B Medical/Surgery History Past Medical History:  Diagnosis Date   Anginal  pain (HCC)    Anxiety    Arthritis    Asthma    Bipolar 1 disorder (HCC)    Bipolar disease, chronic (HCC)    Breast cancer in female (HCC)    2019 and recurrent in 2023 to right breast, right axillary, and left neck   Coronary artery disease    Deep vein blood clot of right lower extremity (HCC)    Diabetes mellitus type 2 in obese    Goals of care, counseling/discussion 11/23/2021   Hyperlipidemia    Lymphedema    MI (myocardial infarction) (HCC)    was in [place]   Obesity    Pancreatitis    Personal history of chemotherapy    Personal history of radiation therapy    Sleep apnea    Past Surgical History:  Procedure Laterality Date   BREAST BIOPSY Left 10/03/2023   times 2   BREAST LUMPECTOMY Left 03/2019   CATARACT EXTRACTION Bilateral    CHOLECYSTECTOMY     IR IMAGING GUIDED PORT INSERTION  12/15/2021   IR REMOVAL TUN ACCESS W/ PORT W/O FL MOD SED  12/20/2019   IR US GUIDE BX ASP/DRAIN  11/02/2021   LEFT HEART CATH AND CORONARY ANGIOGRAPHY N/A 08/22/2020   Procedure: LEFT HEART CATH AND CORONARY ANGIOGRAPHY;  Surgeon: Kathleene Hazel, MD;  Location: [facility];  Service: Cardiovascular;  Laterality: N/A;   TRIGGER FINGER RELEASE Bilateral    x  5   TUBAL LIGATION     UMBILICAL HERNIA REPAIR     x 2   uterine ablation       A IV Location/Drains/Wounds Patient Lines/Drains/Airways Status     Active Line/Drains/Airways     Name Placement date Placement time Site Days   Implanted Port 12/15/21 Right Chest 12/15/21  1315  Chest  659   Peripheral IV 22 G 1" Anterior;Left;Posterior Hand --  --  Hand  --   Peripheral IV 10/05/23 20 G 1" Left Antecubital 10/05/23  1003  Antecubital  less than 1            Intake/Output Last 24 hours No intake or output data in the 24 hours ending 10/05/23 1524  Labs/Imaging Results for orders placed or performed during the hospital encounter of 10/05/23 (from the past 48 hour(s))  CBC with Differential      Status: Abnormal   Collection Time: 10/05/23 10:26 AM  Result Value Ref Range   WBC 5.6 4.0 - 10.5 K/uL   RBC 3.68 (L) 3.87 - 5.11 MIL/uL   Hemoglobin 11.5 (L) 12.0 - 15.0 g/dL   HCT 32.4 (L) 40.1 - 02.7 %   MCV 93.5 80.0 - 100.0 fL   MCH 31.3 26.0 - 34.0 pg   MCHC 33.4 30.0 - 36.0 g/dL   RDW 25.3 66.4 - 40.3 %   Platelets 301 150 - 400 K/uL   nRBC 0.0 0.0 - 0.2 %   Neutrophils Relative % 70 %   Neutro Abs 4.0 1.7 - 7.7 K/uL   Lymphocytes Relative 21 %   Lymphs Abs 1.2 0.7 - 4.0 K/uL   Monocytes Relative 7 %   Monocytes Absolute 0.4 0.1 - 1.0 K/uL   Eosinophils Relative 1 %   Eosinophils Absolute 0.0 0.0 - 0.5 K/uL   Basophils Relative 0 %   Basophils Absolute 0.0 0.0 - 0.1 K/uL   Immature Granulocytes 1 %   Abs Immature Granulocytes 0.03 0.00 - 0.07 K/uL    Comment: Performed at [facility], [address] [place], [place] [address]  Comprehensive metabolic panel     Status: Abnormal   Collection Time: 10/05/23 10:26 AM  Result Value Ref Range   Sodium 135 135 - 145 mmol/L   Potassium 4.2 3.5 - 5.1 mmol/L   Chloride 103 98 - 111 mmol/L   CO2 23 22 - 32 mmol/L   Glucose, Bld 286 (H) 70 - 99 mg/dL    Comment: Glucose reference range applies only to samples taken after fasting for at least 8 hours.   BUN 15 6 - 20 mg/dL   Creatinine, Ser 9.56 0.44 - 1.00 mg/dL   Calcium 8.6 (L) 8.9 - 10.3 mg/dL   Total Protein 6.8 6.5 - 8.1 g/dL   Albumin 3.4 (L) 3.5 - 5.0 g/dL   AST 15 15 - 41 U/L   ALT 13 0 - 44 U/L   Alkaline Phosphatase 89 38 - 126 U/L   Total Bilirubin 0.4 <1.2 mg/dL   GFR, Estimated >38 >75 mL/min    Comment: (NOTE) Calculated using the CKD-EPI Creatinine Equation (2021)    Anion gap 9 5 - 15    Comment: Performed at [facility], [address] [place], [place] [address]  Protime-INR     Status: None   Collection Time: 10/05/23 11:22 AM  Result Value Ref Range   Prothrombin Time 14.2 11.4 - 15.2 seconds   INR 1.1 0.8  -  1.2    Comment: (NOTE) INR goal varies based on device and disease states. Performed at [facility], [address] [place], [place] [address]   APTT     Status: None   Collection Time: 10/05/23 11:22 AM  Result Value Ref Range   aPTT 28 24 - 36 seconds    Comment: Performed at [facility], [address], [place], [place] [address]  Heparin level (unfractionated)     Status: Abnormal   Collection Time: 10/05/23 11:22 AM  Result Value Ref Range   Heparin Unfractionated <0.10 (L) 0.30 - 0.70 IU/mL    Comment: (NOTE) The clinical reportable range upper limit is being lowered to >1.10 to align with the FDA approved guidance for the current laboratory assay.  If heparin results are below expected values, and patient dosage has  been confirmed, suggest follow up testing of antithrombin III levels. Performed at [facility], [address], [place], [place] [address]   Urinalysis, Routine w reflex microscopic -Urine, Clean Catch     Status: Abnormal   Collection Time: 10/05/23  2:39 PM  Result Value Ref Range   Color, Urine YELLOW YELLOW   APPearance CLEAR CLEAR   Specific Gravity, Urine 1.016 1.005 - 1.030   pH 5.0 5.0 - 8.0   Glucose, UA 150 (A) NEGATIVE mg/dL   Hgb urine dipstick NEGATIVE NEGATIVE   Bilirubin Urine NEGATIVE NEGATIVE   Ketones, ur NEGATIVE NEGATIVE mg/dL   Protein, ur NEGATIVE NEGATIVE mg/dL   Nitrite NEGATIVE NEGATIVE   Leukocytes,Ua NEGATIVE NEGATIVE    Comment: Performed at [facility], [address], [place], [place] [address]  CBG monitoring, ED     Status: Abnormal   Collection Time: 10/05/23  3:03 PM  Result Value Ref Range   Glucose-Capillary 194 (H) 70 - 99 mg/dL    Comment: Glucose reference range applies only to samples taken after fasting for at least 8 hours.   DG Chest 2 View  Result Date: 10/05/2023 CLINICAL DATA:  facial swelling, neck swelling, IJV  thrombosis, known breast cancer, chest mass?Marland Kitchen EXAM: CHEST - 2 VIEW COMPARISON:  06/12/2023. FINDINGS: Bilateral lung fields are clear. Bilateral costophrenic angles are clear. Normal cardio-mediastinal silhouette. No acute osseous abnormalities. The soft tissues are within normal limits. Right-sided CT Port-A-Cath is again seen with its tip overlying the cavoatrial junction region. IMPRESSION: No active cardiopulmonary disease. Electronically Signed   By: Jules Schick M.D.   On: 10/05/2023 15:14   MR Neck Soft Tissue Only W or Wo Contrast  Result Date: 10/05/2023 CLINICAL DATA:  Neck mass, nonpulsatile.  Recurrent breast cancer. EXAM: MRI OF THE NECK WITH CONTRAST TECHNIQUE: Multiplanar, multisequence MR imaging was performed following the administration of intravenous contrast. CONTRAST:  10mL GADAVIST GADOBUTROL 1 MMOL/ML IV SOLN COMPARISON:  Neck CT 09/29/2023. FINDINGS: Pharynx and larynx: No intraluminal mass or submucosal edema. Edema/fluid in the left parapharyngeal and retropharyngeal spaces, likely secondary to venous and/or lymphatic obstruction. Salivary glands: Asymmetric edema and hyperenhancement of the left parotid gland, likely secondary to venous and/or lymphatic obstruction. Thyroid: Normal. Lymph nodes: Centrally necrotic left level 4 lymph node measures up to 21 x 17 mm (axial image 44 series 13). Ill-defined soft tissue signal and enhancement extending along the entirety of left level 5 may represent metastatic disease in the lymph nodes with extracapsular extension. Thickening and enhancement of the left sternoclavicular muscle likely reflects direct invasion with tumor. Confluent enhancement along the left carotid  sheath throughout the left level 2, 3 and 4 stations is favored to reflect nodal metastatic disease with extracapsular extension. Vascular: Obstruction of the left internal jugular vein at the level of the necrotic left level 4 lymph node with bland thrombus extending  cranially to the level of the larynx (axial image 41 series 11 and 13). Limited intracranial: Unremarkable. Visualized orbits: Periorbital edema in the left, likely secondary to venous and/or lymphatic obstruction. Mastoids and visualized paranasal sinuses: Well aerated. Skeleton: Edema in the right-greater-than-left dorsal paraspinal muscles along the lower cervical spine is favored to reflect muscle strain. Upper chest: Unremarkable. Other: None. IMPRESSION: 1. Centrally necrotic left level 4 lymph node measures up to 21 x 17 mm, consistent with nodal metastatic disease. Ill-defined soft tissue signal and enhancement extending along the entirety of left level 5 may represent metastatic disease in the lymph nodes with extracapsular extension. Thickening and enhancement of the left sternoclavicular muscle likely reflects direct invasion with tumor. Confluent enhancement along the left carotid sheath throughout the left level 2, 3 and 4 stations is favored to reflect nodal metastatic disease with extracapsular extension. 2. Obstruction of the left internal jugular vein at the level of the necrotic left level 4 lymph node with bland thrombus extending cranially to the level of the larynx. 3. Edema/fluid in the superficial left face/neck, as well as the left parapharyngeal and retropharyngeal spaces, likely secondary to venous and/or lymphatic obstruction. 4. Edema in the right-greater-than-left dorsal paraspinal muscles along the lower cervical spine is favored to reflect muscle strain. Electronically Signed   By: Orvan Falconer M.D.   On: 10/05/2023 13:39   MR Brain W and Wo Contrast  Result Date: 10/05/2023 CLINICAL DATA:  Brain/CNS neoplasm, staging. Recurrent breast cancer. EXAM: MRI HEAD WITHOUT AND WITH CONTRAST TECHNIQUE: Multiplanar, multiecho pulse sequences of the brain and surrounding structures were obtained without and with intravenous contrast. CONTRAST:  10mL GADAVIST GADOBUTROL 1 MMOL/ML IV SOLN  COMPARISON:  Brain MRI 09/03/2023. FINDINGS: Brain: Unchanged 7 mm dural-based enhancing lesion along the left middle frontal gyrus, again favored to reflect a small meningioma. No new foci of abnormal intracranial enhancement. No acute infarct or hemorrhage. No hydrocephalus or extra-axial collection. Vascular: Normal flow voids and vessel enhancement. Skull and upper cervical spine: Normal marrow signal and enhancement. Sinuses/Orbits: No acute findings. Other: None. IMPRESSION: 1. No evidence of intracranial metastatic disease. 2. Unchanged 7 mm dural-based enhancing lesion along the left middle frontal gyrus, again favored to reflect a small meningioma. Electronically Signed   By: Orvan Falconer M.D.   On: 10/05/2023 13:08    Pending Labs [employer] (From admission, onward)     Start     Ordered   Signed and Held  HIV Antibody (routine testing w rflx)  (HIV Antibody (Routine testing w reflex) panel)  Once,   R        Signed and Held   Signed and Held  Comprehensive metabolic panel  Tomorrow morning,   R        Signed and Held   Signed and Held  CBC  Tomorrow morning,   R        Signed and Held            Vitals/Pain Today's Vitals   10/05/23 1115 10/05/23 1119 10/05/23 1421 10/05/23 1439  BP: 103/64     Pulse: (!) 106     Resp: 18     Temp:   98.2 F (36.8 C)   SpO2: 94%  Weight:      Height:      PainSc:  7   8     Isolation Precautions No active isolations  Medications Medications  lidocaine (XYLOCAINE) 2 % jelly 1 Application (1 Application Topical Not Given 10/05/23 1134)  heparin ADULT infusion 100 units/mL (25000 units/278mL) (1,250 Units/hr Intravenous New Bag/Given 10/05/23 1238)  insulin aspart (novoLOG) injection 0-20 Units (4 Units Subcutaneous Given 10/05/23 1521)  insulin aspart (novoLOG) injection 0-5 Units (has no administration in time range)  insulin glargine-yfgn (SEMGLEE) injection 20 Units (has no administration in time range)  dexamethasone  (DECADRON) injection 8 mg (has no administration in time range)  fenofibrate tablet 160 mg (has no administration in time range)  rosuvastatin (CRESTOR) tablet 40 mg (has no administration in time range)  metoprolol tartrate (LOPRESSOR) tablet 25 mg (has no administration in time range)  HYDROmorphone (DILAUDID) injection 1 mg (1 mg Intravenous Given 10/05/23 1017)  heparin bolus via infusion 2,000 Units (2,000 Units Intravenous Bolus from Bag 10/05/23 1239)  gadobutrol (GADAVIST) 1 MMOL/ML injection 10 mL (10 mLs Intravenous Contrast Given 10/05/23 1218)  HYDROmorphone (DILAUDID) injection 2 mg (2 mg Intravenous Given 10/05/23 1312)  dexamethasone (DECADRON) injection 8 mg (8 mg Intravenous Given 10/05/23 1304)    Mobility walks with device     Focused Assessments Neuro Assessment Handoff:  Swallow screen pass?  N/A         Neuro Assessment:   Neuro Checks:      Has TPA been given? No If patient is a Neuro Trauma and patient is going to OR before floor call report to 4N Charge nurse: [phone] or [phone]   R Recommendations: See Admitting Provider Note  Report given to:   Additional Notes:

## 2023-10-06 ENCOUNTER — Encounter (HOSPITAL_COMMUNITY): Payer: Self-pay

## 2023-10-06 ENCOUNTER — Ambulatory Visit
Admit: 2023-10-06 | Discharge: 2023-10-06 | Disposition: A | Discharge status: Home / Self Care | Payer: Medicaid Other | Attending: Radiation Oncology | Admitting: Radiation Oncology

## 2023-10-06 ENCOUNTER — Inpatient Hospital Stay (HOSPITAL_COMMUNITY): Payer: Medicaid Other

## 2023-10-06 ENCOUNTER — Encounter (HOSPITAL_COMMUNITY): Payer: Self-pay | Admitting: Hematology & Oncology

## 2023-10-06 DIAGNOSIS — C7989 Secondary malignant neoplasm of other specified sites: Secondary | ICD-10-CM | POA: Insufficient documentation

## 2023-10-06 DIAGNOSIS — C770 Secondary and unspecified malignant neoplasm of lymph nodes of head, face and neck: Secondary | ICD-10-CM | POA: Insufficient documentation

## 2023-10-06 DIAGNOSIS — Z171 Estrogen receptor negative status [ER-]: Secondary | ICD-10-CM | POA: Insufficient documentation

## 2023-10-06 DIAGNOSIS — Z510 Encounter for antineoplastic radiation therapy: Secondary | ICD-10-CM | POA: Insufficient documentation

## 2023-10-06 DIAGNOSIS — I82C19 Acute embolism and thrombosis of unspecified internal jugular vein: Secondary | ICD-10-CM | POA: Insufficient documentation

## 2023-10-06 DIAGNOSIS — C50011 Malignant neoplasm of nipple and areola, right female breast: Secondary | ICD-10-CM

## 2023-10-06 DIAGNOSIS — C50012 Malignant neoplasm of nipple and areola, left female breast: Secondary | ICD-10-CM

## 2023-10-06 DIAGNOSIS — R220 Localized swelling, mass and lump, head: Secondary | ICD-10-CM | POA: Diagnosis not present

## 2023-10-06 HISTORY — PX: IR PATIENT EVAL TECH 0-60 MINS: IMG5564

## 2023-10-06 LAB — COMPREHENSIVE METABOLIC PANEL
ALT: 13 U/L (ref 0–44)
AST: 12 U/L — ABNORMAL LOW (ref 15–41)
Albumin: 3.3 g/dL — ABNORMAL LOW (ref 3.5–5.0)
Alkaline Phosphatase: 87 U/L (ref 38–126)
Anion gap: 10 (ref 5–15)
BUN: 11 mg/dL (ref 6–20)
CO2: 22 mmol/L (ref 22–32)
Calcium: 8.6 mg/dL — ABNORMAL LOW (ref 8.9–10.3)
Chloride: 101 mmol/L (ref 98–111)
Creatinine, Ser: 0.57 mg/dL (ref 0.44–1.00)
GFR, Estimated: >60 mL/min (ref >60)
Glucose, Bld: 240 mg/dL — ABNORMAL HIGH (ref 70–99)
Potassium: 4.1 mmol/L (ref 3.5–5.1)
Sodium: 133 mmol/L — ABNORMAL LOW (ref 135–145)
Total Bilirubin: 0.5 mg/dL (ref <1.2)
Total Protein: 6.4 g/dL — ABNORMAL LOW (ref 6.5–8.1)

## 2023-10-06 LAB — CBC
HCT: 34.2 % — ABNORMAL LOW (ref 36.0–46.0)
Hemoglobin: 11.6 g/dL — ABNORMAL LOW (ref 12.0–15.0)
MCH: 31.2 pg (ref 26.0–34.0)
MCHC: 33.9 g/dL (ref 30.0–36.0)
MCV: 91.9 fL (ref 80.0–100.0)
Platelets: 294 10*3/uL (ref 150–400)
RBC: 3.72 MIL/uL — ABNORMAL LOW (ref 3.87–5.11)
RDW: 14.9 % (ref 11.5–15.5)
WBC: 9.4 10*3/uL (ref 4.0–10.5)
nRBC: 0 % (ref 0.0–0.2)

## 2023-10-06 LAB — GLUCOSE, CAPILLARY
Glucose-Capillary: 229 mg/dL — ABNORMAL HIGH (ref 70–99)
Glucose-Capillary: 199 mg/dL — ABNORMAL HIGH (ref 70–99)
Glucose-Capillary: 284 mg/dL — ABNORMAL HIGH (ref 70–99)
Glucose-Capillary: 238 mg/dL — ABNORMAL HIGH (ref 70–99)

## 2023-10-06 LAB — HEPARIN LEVEL (UNFRACTIONATED)
Heparin Unfractionated: 0.28 [IU]/mL — ABNORMAL LOW (ref 0.30–0.70)
Heparin Unfractionated: 0.13 [IU]/mL — ABNORMAL LOW (ref 0.30–0.70)
Heparin Unfractionated: 0.55 [IU]/mL (ref 0.30–0.70)

## 2023-10-06 MED ORDER — ALBUTEROL SULFATE 1.25 MG/3ML IN NEBU: 1.0000 | INHALATION_SOLUTION | Freq: Four times a day (QID) | RESPIRATORY_TRACT | Status: DC | PRN

## 2023-10-06 MED ORDER — IOHEXOL 300 MG/ML  SOLN
100.0000 mL | Freq: Once | INTRAMUSCULAR | Status: AC | PRN
  Administered 2023-10-06: 100 mL via INTRAVENOUS

## 2023-10-06 MED ORDER — QUETIAPINE FUMARATE 200 MG PO TABS
800.0000 mg | ORAL_TABLET | Freq: Every day | ORAL | Status: DC
  Administered 2023-10-06 – 2023-10-11 (×5): 800 mg via ORAL
  Filled 2023-10-06 (×6): qty 4

## 2023-10-06 MED ORDER — CLINDAMYCIN PHOSPHATE 600 MG/50ML IV SOLN
600.0000 mg | Freq: Three times a day (TID) | INTRAVENOUS | Status: DC
  Administered 2023-10-06 – 2023-10-07 (×3): 600 mg via INTRAVENOUS
  Filled 2023-10-06 (×3): qty 50

## 2023-10-06 MED ORDER — CYCLOBENZAPRINE HCL 5 MG PO TABS
5.0000 mg | ORAL_TABLET | Freq: Two times a day (BID) | ORAL | Status: DC | PRN
  Administered 2023-10-06: 5 mg via ORAL
  Filled 2023-10-06: qty 1

## 2023-10-06 MED ORDER — HYDROMORPHONE HCL 1 MG/ML IJ SOLN
1.0000 mg | Freq: Once | INTRAMUSCULAR | Status: AC
  Administered 2023-10-06: 1 mg via INTRAVENOUS
  Filled 2023-10-06: qty 1

## 2023-10-06 MED ORDER — ALBUTEROL SULFATE (2.5 MG/3ML) 0.083% IN NEBU
2.5000 mg | INHALATION_SOLUTION | Freq: Four times a day (QID) | RESPIRATORY_TRACT | Status: DC | PRN
  Filled 2023-10-06: qty 3

## 2023-10-06 MED ORDER — DIAZEPAM 2 MG PO TABS
2.0000 mg | ORAL_TABLET | Freq: Two times a day (BID) | ORAL | Status: DC | PRN
  Administered 2023-10-06 – 2023-10-12 (×2): 2 mg via ORAL
  Filled 2023-10-06 (×2): qty 1

## 2023-10-06 MED ORDER — OXYBUTYNIN CHLORIDE ER 5 MG PO TB24
5.0000 mg | ORAL_TABLET | Freq: Every day | ORAL | Status: DC
  Administered 2023-10-06 – 2023-10-11 (×6): 5 mg via ORAL
  Filled 2023-10-06 (×6): qty 1

## 2023-10-06 MED ORDER — CHLORHEXIDINE GLUCONATE CLOTH 2 % EX PADS
6.0000 | MEDICATED_PAD | Freq: Every day | CUTANEOUS | Status: DC
  Administered 2023-10-06 – 2023-10-12 (×7): 6 via TOPICAL

## 2023-10-06 NOTE — Consult Note (Signed)
Ariana Herrera is well-known to me.  She is a very nice 57 year old postmenopausal white female.  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  She had been on chemotherapy.  We had stopped chemotherapy a year ago as she was having a difficult time tolerating treatment.  She had been doing pretty well.  She was having some pain in the left breast.  She then began to have some swelling in the neck. Of note, she had a PET scan that was done in October.  This was unremarkable for any obvious metastatic disease.  She continued to have swelling in the neck.  There is firmness on the left side of the neck.  She had a CT of the neck done on 09/29/2023.  This showed increasing density in the left supraclavicular region.  There is thrombosis of the left jugular vein.  There was malignant appearing nodal mass in some of the lymph nodes.  There is swelling of the sternocleidomastoid muscle.  She then had an MRI.  This confirmed that there was disease in the left side of the neck.  She had MRI of the brain which showed a 7 mm likely meningioma.  She was admitted.  She is on heparin right now.  She did have a breast biopsy.  This is on 10/03/2023.  The pathology report ([id number]) showed invasive ductal carcinoma.  Again I suspect this is going to be triple negative.  She has a lot of periorbital edema.  She is trying to stop smoking..  She has a cough.  A chest x-ray done on 09/25/2023 was negative for any obvious pulmonary involvement.  I just feel bad that she has this disease now.  This is just in a tough spot.  I think we are to have to consider radiotherapy for the disease in the neck.  I also want to try to use chemotherapy with this to see if we can try to improve her response.  She probably does need to have a CT of the body to see if she has disease elsewhere.  Her last CA 27.29 was 76.  This was couple weeks ago.  She still is in good shape.  We  still have quite a few options that we can consider to try to help her quality of life.  Her labs today show sodium 133.  Potassium 4.1.  BUN 11 creatinine 0.57.  Calcium 8.6 with an albumin of 3.3.  LFTs are normal.  CBC shows white cell count of 9.4.  Hemoglobin 11.6.  Platelet count 294,000.  On her physical exam, she does have facial swelling.  She has periorbital edema.  This is more prominent on the left side.  She is vital signs are temperature 98.9.  Pulse 98.  Blood pressure 131/62.  Head neck exam again shows the facial swelling.  She has firmness on the left side of her neck.  Right side feels relatively soft.  She has a mass in the left breast.  Lungs sound relatively clear bilaterally.  Cardiac exam regular rate and rhythm.  Abdomen is soft.  Bowel sounds are present.  There is no fluid wave.  There is no palpable liver or spleen tip.  Extremities shows no clubbing, cyanosis or edema.  Neurological exam shows no focal neurological deficits.   Ariana Herrera has a recurrence of her breast cancer.  I just hate that this is recurred in the location that has.  It is  certainly causing her quite a bit of issues.  I do not know if there is any role for any type of thrombectomy for the left jugular vein.  I will have to speak with Interventional Radiology regarding this.  Again I think a combination of radiation chemotherapy would be reasonable to consider.  I will speak with Radiation Oncology today.  I suspect she is probably going need to have inpatient treatment for right now.  We will have to see what the CT scan of the body shows.  Again, we are dealing with quality of life issues.  I know her birthday is coming up.  Hopefully, we will be able to get her feeling a little bit better so she can enjoy her birthday and celebrate her birthday.  I need to have Interventional Radiology try to help with her Port-A-Cath.  Hopefully that will we will be able to open up so we can use it.  I know  this is going to be challenging.  Thankfully, she is in good shape.  I know that her faith remains strong.   Christin Bach, MD  Isaiah 40:3    Unfortunately, looks like she has had recurrent disease to the neck.  She had MRI of the soft tissues.

## 2023-10-06 NOTE — Progress Notes (Signed)
PHARMACY - ANTICOAGULATION CONSULT NOTE  Pharmacy Consult for IV heparin Indication: DVT  Allergies  Allergen Reactions   Lithium Other (See Comments)    Spinal fluid built up in brain   Dulaglutide Nausea And Vomiting and Other (See Comments)    TRULICITY   Penicillins Other (See Comments)    UNKNOWN CHILDHOOD REACTION    Patient Measurements: Height: 5\' 2"  (157.5 cm) Weight: 84.5 kg (186 lb 4.6 oz) IBW/kg (Calculated) : 50.1 Heparin Dosing Weight: 70 kg  Vital Signs: Temp: 98.7 F (37.1 C) (12/12 1107) Temp Source: Oral (12/12 1107) BP: 148/117 (12/12 1107) Pulse Rate: 103 (12/12 1107)  Labs: Recent Labs    10/05/23 1026 10/05/23 1122 10/05/23 1122 10/05/23 1736 10/06/23 0035 10/06/23 0956  HGB 11.5*  --   --   --  11.6*  --   HCT 34.4*  --   --   --  34.2*  --   PLT 301  --   --   --  294  --   APTT  --  28  --   --   --   --   LABPROT  --  14.2  --   --   --   --   INR  --  1.1  --   --   --   --   HEPARINUNFRC  --  <0.10*   < > 0.47 0.28* 0.13*  CREATININE 0.77  --   --   --  0.57  --    < > = values in this interval not displayed.    Estimated Creatinine Clearance: 81.1 mL/min (by C-G formula based on SCr of 0.57 mg/dL).   Medical History: Past Medical History:  Diagnosis Date   Anginal pain (HCC)    Anxiety    Arthritis    Asthma    Bipolar 1 disorder (HCC)    Bipolar disease, chronic (HCC)    Breast cancer in female (HCC)    2019 and recurrent in 2023 to right breast, right axillary, and left neck   Coronary artery disease    Deep vein blood clot of right lower extremity (HCC)    Diabetes mellitus type 2 in obese    Goals of care, counseling/discussion 11/23/2021   Hyperlipidemia    Lymphedema    MI (myocardial infarction) (HCC)    was in [place]   Obesity    Pancreatitis    Personal history of chemotherapy    Personal history of radiation therapy    Sleep apnea     Medications:  Prescribed Lovenox 40 mg subcutaneous q12h  yesterday, Last dose 12/10 evening  Assessment: Pharmacy consulted to dose IV heparin for this 57 yo female with a past medical history of CAD, type 2 DM, HLD, asthma, breast cancer. Arrived today to ED stating she saw her cancer doctor yesterday, was told she was informed she needed to be admitted for a new work up related to a finding of a possible meningioma. Pt reports she has been experiencing ongoing swelling to the left side of her neck and left eye. She states she was told she has a blood clot in her neck as well, she took her first dose of Lovenox last night.   Imaging: 12/5 CT soft tissue neck: thrombosis of the left jugular vein which is associated with edema of the left face and neck  Heparin level SUBtherapeutic due to IV heparin being turned off for over an hour due to going for [facility] procedure  Hgb 11.6, plts WNL No bleeding noted per RN  Goal of Therapy:  Heparin level 0.3-0.7 units/ml Monitor platelets by anticoagulation protocol: Yes   Plan:  Restart IV heparin at previous rate of 1400 units/hr Recheck heparin level in another 6 hours Monitor daily heparin level, CBC, signs/symptoms of bleeding    Hessie Knows, PharmD, BCPS Secure Chat if ?s 10/06/2023 11:31 AM

## 2023-10-06 NOTE — Plan of Care (Signed)

## 2023-10-06 NOTE — Progress Notes (Addendum)
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 Myna Hidalgo who presents with worsening neck swelling which show multiple necrotic lymphadenopathy and IJ vein thrombosis   Assessment & Plan:   Principal Problem:   Facial swelling Active Problems:   Breast cancer (HCC)   Type 2 diabetes mellitus with hyperglycemia, with long-term current use of insulin (HCC)   Obesity (BMI 30-39.9)   Internal jugular (IJ) vein thromboembolism, acute (HCC)   1-Left internal jugular vein thrombosis and obstruction with necrotic lymph nodes In the setting of metastatic breast cancer -Continue with heparin drip -Continue with IV Decadron -Dr. Myna Hidalgo will  discussed with IR and radiation oncology for their treatment options -Radiation oncology consulted, patient to undergo today to CT simulation.  They anticipate 5 weeks of radiation to the left neck -Left periorbital edema appear more red, will start IV clindamycin  2-7 mm dural based enhancing lesion along the left medial frontal gyrus, favored to reflect a small meningioma; -Follow Dr. Myna Hidalgo recommendation   3-Diabetes type 2 with hyperglycemia uncontrolled Suspect will get worsening hyperglycemia in the setting of IV Decadron Started  Semglee 20 units daily,  sliding scale insulin   Recurrent left ductal carcinoma of breast cancer;  Followed by Dr. Myna Hidalgo now with concern of metastatic disease to the neck Dr. Myna Hidalgo consulted Patient will need Port-A-Cath and plan is to start chemotherapy CT abdomen and pelvis obtained  Hyperlipidemia: continue fenobrite.  Continue with Crestor as advised by her cardiology    Hypertension: Continue with metoprolol Pseudohyponatremia.   Obesity type 1; needs weight  loss Estimated body mass index is 34.07 kg/m as calculated from the following:   Height as of this encounter: 5\' 2"  (1.575 m).   Weight as of this encounter: 84.5 kg.   DVT prophylaxis: Heparin drip Code Status: Full code Family Communication: Care discussed with patient Disposition Plan:  Status is: Inpatient Remains inpatient appropriate because: Management of IJ thrombosis and cervical metastasis disease    Consultants:  Dr. Myna Hidalgo Dr. Roselind Messier radiation oncology IR  Procedures:    Antimicrobials:    Subjective: She still having left facial swelling, left eye appears more red today, she feels that she can open her eyes a little bit better.  She continued to have headache occipital area near the neck  Objective: Vitals:   10/06/23 0312 10/06/23 0730 10/06/23 1107 10/06/23 1300  BP: 131/62 (!) 149/49 (!) 148/117 117/73  Pulse: 98 96 (!) 103 96  Resp: 16  18 20   Temp: 98.9 F (37.2 C) 98.6 F (37 C) 98.7 F (37.1 C) 98.4 F (36.9 C)  TempSrc: Oral Oral Oral Oral  SpO2: 92% 96% 95% 96%  Weight:      Height:        Intake/Output Summary (Last 24 hours) at 10/06/2023 1551 Last data filed at 10/05/2023 1700 Gross per 24 hour  Intake 47.21 ml  Output --  Net 47.21 ml   Filed Weights   10/05/23 0938 10/05/23 1640  Weight: 87.1 kg 84.5 kg    Examination:  General exam: Appears calm and comfortable , left side facial edema, periorbital edema, with redness Respiratory system: Clear to auscultation. Respiratory effort normal. Cardiovascular system: S1 & S2 heard, RRR. No JVD, murmurs, rubs, gallops or clicks. No pedal edema. Gastrointestinal system:  Abdomen is nondistended, soft and nontender. No organomegaly or masses felt. Normal bowel sounds heard. Central nervous system: Alert and oriented. No focal neurological deficits. Extremities: Symmetric 5 x 5 power.   Data Reviewed: I have personally reviewed following labs and imaging studies  CBC: Recent Labs   Lab 10/05/23 1026 10/06/23 0035  WBC 5.6 9.4  NEUTROABS 4.0  --   HGB 11.5* 11.6*  HCT 34.4* 34.2*  MCV 93.5 91.9  PLT 301 294   Basic Metabolic Panel: Recent Labs  Lab 10/05/23 1026 10/06/23 0035  NA 135 133*  K 4.2 4.1  CL 103 101  CO2 23 22  GLUCOSE 286* 240*  BUN 15 11  CREATININE 0.77 0.57  CALCIUM 8.6* 8.6*   GFR: Estimated Creatinine Clearance: 81.1 mL/min (by C-G formula based on SCr of 0.57 mg/dL). Liver Function Tests: Recent Labs  Lab 10/05/23 1026 10/06/23 0035  AST 15 12*  ALT 13 13  ALKPHOS 89 87  BILITOT 0.4 0.5  PROT 6.8 6.4*  ALBUMIN 3.4* 3.3*   No results for input(s): "LIPASE", "AMYLASE" in the last 168 hours. No results for input(s): "AMMONIA" in the last 168 hours. Coagulation Profile: Recent Labs  Lab 10/05/23 1122  INR 1.1   Cardiac Enzymes: No results for input(s): "CKTOTAL", "CKMB", "CKMBINDEX", "TROPONINI" in the last 168 hours. BNP (last 3 results) No results for input(s): "PROBNP" in the last 8760 hours. HbA1C: No results for input(s): "HGBA1C" in the last 72 hours. CBG: Recent Labs  Lab 10/05/23 1503 10/05/23 1844 10/05/23 2057 10/06/23 0737 10/06/23 1144  GLUCAP 194* 256* 285* 229* 199*   Lipid Profile: No results for input(s): "CHOL", "HDL", "LDLCALC", "TRIG", "CHOLHDL", "LDLDIRECT" in the last 72 hours. Thyroid Function Tests: No results for input(s): "TSH", "T4TOTAL", "FREET4", "T3FREE", "THYROIDAB" in the last 72 hours. Anemia Panel: No results for input(s): "VITAMINB12", "FOLATE", "FERRITIN", "TIBC", "IRON", "RETICCTPCT" in the last 72 hours. Sepsis Labs: No results for input(s): "PROCALCITON", "LATICACIDVEN" in the last 168 hours.  No results found for this or any previous visit (from the past 240 hours).       Radiology Studies: CT CHEST ABDOMEN PELVIS W CONTRAST Result Date: 10/06/2023 CLINICAL DATA:  Metastatic breast cancer with recent left breast recurrence. Assess treatment response. * Tracking  Code: BO * EXAM: CT CHEST, ABDOMEN, AND PELVIS WITH CONTRAST TECHNIQUE: Multidetector CT imaging of the chest, abdomen and pelvis was performed following the standard protocol during bolus administration of intravenous contrast. RADIATION DOSE REDUCTION: This exam was performed according to the departmental dose-optimization program which includes automated exposure control, adjustment of the mA and/or kV according to patient size and/or use of iterative reconstruction technique. CONTRAST:  OMNIPAQUE IOHEXOL 300 MG/ML  SOLN COMPARISON:  CTA of the chest, abdomen and pelvis 08/20/2020. PET-CT 08/04/2023 and 03/04/2023. Ultrasound 06/28/2023. FINDINGS: CT CHEST FINDINGS Cardiovascular: Right IJ Port-A-Cath extends to the lower SVC level. No acute vascular findings are demonstrated. There is mild atherosclerosis of the aorta, great vessels and coronary arteries. The heart size is normal. There is no pericardial effusion. Mediastinum/Nodes: There are no enlarged mediastinal, hilar, axillary or internal mammary lymph nodes. There are apparent surgical changes in the left supraclavicular area with irregular subcutaneous nodularity, similar to most recent PET-CT and not hypermetabolic, although not seen on the 03/04/2023 PET-CT. Low-density left supraclavicular nodule measuring 2.0 x 1.2 cm on image 5/2 is grossly unchanged from previous PET-CT as well, not hypermetabolic. The thyroid gland, trachea and esophagus demonstrate no significant findings. Lungs/Pleura:  No pleural effusion or pneumothorax. There are stable subpleural radiation changes anteriorly in the left upper lobe. Mild central airway thickening noted. No focal airspace disease or suspicious pulmonary nodularity. Musculoskeletal/Chest wall: Grossly stable postsurgical changes in the left breast with diffuse dermal and trabecular thickening, similar to recent PET-CT. New soft tissue emphysema laterally in the left breast attributed to recent biopsy. No  other chest wall mass or suspicious osseous finding. CT ABDOMEN AND PELVIS FINDINGS Hepatobiliary: The liver is normal in density without suspicious focal abnormality. No significant biliary dilatation status post cholecystectomy. Pancreas: Generalized atrophy. No focal mass lesion, ductal dilatation or surrounding inflammation. Spleen: Normal in size without focal abnormality. Adrenals/Urinary Tract: Both adrenal glands appear normal. No evidence of urinary tract calculus, suspicious renal lesion or hydronephrosis. Mild renal cortical scarring bilaterally. No bladder abnormality identified. Stomach/Bowel: No enteric contrast administered. The stomach appears unremarkable for its degree of distension. No evidence of bowel wall thickening, distention or surrounding inflammatory change. The appendix appears normal. There is prominent stool throughout the colon. Vascular/Lymphatic: There are no enlarged abdominal or pelvic lymph nodes. Diffuse aortic and branch vessel atherosclerosis without evidence of aneurysm or large vessel occlusion. There is mild diffuse common iliac artery luminal narrowing bilaterally. Reproductive: The uterus and ovaries appear unremarkable. No adnexal mass. Other: Small periumbilical hernia containing only fat. No ascites or peritoneal nodularity. No pneumoperitoneum. Musculoskeletal: No acute or significant osseous findings. IMPRESSION: 1. Possible postsurgical changes in the left supraclavicular area, similar to PET-CT of 2 months ago but new from 03/04/2023. There was no significant hypermetabolic activity in these areas at recent PET-CT, although metastatic disease is a concern, especially if there has been no prior surgery in this area. The patient did present for ultrasound with a palpable abnormality in this region 3 months ago. Consider dedicated CT of the neck or follow up PET-CT. 2. Postsurgical changes in the left breast, grossly stable. 3. No evidence of distant metastatic disease  in the chest, abdomen or pelvis. 4. Prominent stool throughout the colon suggesting constipation. 5.  Aortic Atherosclerosis (ICD10-I70.0). Electronically Signed   By: Carey Bullocks M.D.   On: 10/06/2023 12:40   IR PATIENT EVAL TECH 0-60 MINS Result Date: 10/06/2023 Barrett Shell, RT     10/06/2023  9:13 AM Patient was seen today in IR for portacath evaluation due to port not getting blood return.  Portacath was accessed and flushed well.  No blood return was noted, at this time, a power flush of the portacath was performed. After power flushing portacath, blood return was noted.  Port was then flushed again and blood return noted again.  At this time, portacath was left accessed, and a new, clean dry dressing was placed over access needle.  DG Chest 2 View Result Date: 10/05/2023 CLINICAL DATA:  facial swelling, neck swelling, IJV thrombosis, known breast cancer, chest mass?Marland Kitchen EXAM: CHEST - 2 VIEW COMPARISON:  06/12/2023. FINDINGS: Bilateral lung fields are clear. Bilateral costophrenic angles are clear. Normal cardio-mediastinal silhouette. No acute osseous abnormalities. The soft tissues are within normal limits. Right-sided CT Port-A-Cath is again seen with its tip overlying the cavoatrial junction region. IMPRESSION: No active cardiopulmonary disease. Electronically Signed   By: Jules Schick M.D.   On: 10/05/2023 15:14   MR Neck Soft Tissue Only W or Wo Contrast Result Date: 10/05/2023 CLINICAL DATA:  Neck mass, nonpulsatile.  Recurrent breast cancer. EXAM: MRI OF THE NECK WITH CONTRAST TECHNIQUE: Multiplanar, multisequence MR imaging was performed following the administration of  intravenous contrast. CONTRAST:  10mL GADAVIST GADOBUTROL 1 MMOL/ML IV SOLN COMPARISON:  Neck CT 09/29/2023. FINDINGS: Pharynx and larynx: No intraluminal mass or submucosal edema. Edema/fluid in the left parapharyngeal and retropharyngeal spaces, likely secondary to venous and/or lymphatic obstruction. Salivary  glands: Asymmetric edema and hyperenhancement of the left parotid gland, likely secondary to venous and/or lymphatic obstruction. Thyroid: Normal. Lymph nodes: Centrally necrotic left level 4 lymph node measures up to 21 x 17 mm (axial image 44 series 13). Ill-defined soft tissue signal and enhancement extending along the entirety of left level 5 may represent metastatic disease in the lymph nodes with extracapsular extension. Thickening and enhancement of the left sternoclavicular muscle likely reflects direct invasion with tumor. Confluent enhancement along the left carotid sheath throughout the left level 2, 3 and 4 stations is favored to reflect nodal metastatic disease with extracapsular extension. Vascular: Obstruction of the left internal jugular vein at the level of the necrotic left level 4 lymph node with bland thrombus extending cranially to the level of the larynx (axial image 41 series 11 and 13). Limited intracranial: Unremarkable. Visualized orbits: Periorbital edema in the left, likely secondary to venous and/or lymphatic obstruction. Mastoids and visualized paranasal sinuses: Well aerated. Skeleton: Edema in the right-greater-than-left dorsal paraspinal muscles along the lower cervical spine is favored to reflect muscle strain. Upper chest: Unremarkable. Other: None. IMPRESSION: 1. Centrally necrotic left level 4 lymph node measures up to 21 x 17 mm, consistent with nodal metastatic disease. Ill-defined soft tissue signal and enhancement extending along the entirety of left level 5 may represent metastatic disease in the lymph nodes with extracapsular extension. Thickening and enhancement of the left sternoclavicular muscle likely reflects direct invasion with tumor. Confluent enhancement along the left carotid sheath throughout the left level 2, 3 and 4 stations is favored to reflect nodal metastatic disease with extracapsular extension. 2. Obstruction of the left internal jugular vein at the level  of the necrotic left level 4 lymph node with bland thrombus extending cranially to the level of the larynx. 3. Edema/fluid in the superficial left face/neck, as well as the left parapharyngeal and retropharyngeal spaces, likely secondary to venous and/or lymphatic obstruction. 4. Edema in the right-greater-than-left dorsal paraspinal muscles along the lower cervical spine is favored to reflect muscle strain. Electronically Signed   By: Orvan Falconer M.D.   On: 10/05/2023 13:39   MR Brain W and Wo Contrast Result Date: 10/05/2023 CLINICAL DATA:  Brain/CNS neoplasm, staging. Recurrent breast cancer. EXAM: MRI HEAD WITHOUT AND WITH CONTRAST TECHNIQUE: Multiplanar, multiecho pulse sequences of the brain and surrounding structures were obtained without and with intravenous contrast. CONTRAST:  10mL GADAVIST GADOBUTROL 1 MMOL/ML IV SOLN COMPARISON:  Brain MRI 09/03/2023. FINDINGS: Brain: Unchanged 7 mm dural-based enhancing lesion along the left middle frontal gyrus, again favored to reflect a small meningioma. No new foci of abnormal intracranial enhancement. No acute infarct or hemorrhage. No hydrocephalus or extra-axial collection. Vascular: Normal flow voids and vessel enhancement. Skull and upper cervical spine: Normal marrow signal and enhancement. Sinuses/Orbits: No acute findings. Other: None. IMPRESSION: 1. No evidence of intracranial metastatic disease. 2. Unchanged 7 mm dural-based enhancing lesion along the left middle frontal gyrus, again favored to reflect a small meningioma. Electronically Signed   By: Orvan Falconer M.D.   On: 10/05/2023 13:08        Scheduled Meds:  Chlorhexidine Gluconate Cloth  6 each Topical Daily   dexamethasone (DECADRON) injection  8 mg Intravenous Q24H   insulin aspart  0-20 Units Subcutaneous TID WC   insulin aspart  0-5 Units Subcutaneous QHS   insulin glargine-yfgn  20 Units Subcutaneous Daily   lidocaine  1 Application Topical Once   metoprolol tartrate  25  mg Oral BID   nicotine  21 mg Transdermal Daily   oxybutynin  5 mg Oral QHS   QUEtiapine  800 mg Oral QHS   rosuvastatin  40 mg Oral Daily   senna  1 tablet Oral BID   Continuous Infusions:  clindamycin (CLEOCIN) IV     heparin Stopped (10/06/23 0845)     LOS: 1 day    Time spent: 35 minutes   Damarkus Balis A Loghan Subia, MD [facility]   If 7PM-7AM, please contact night-coverage [email or web address]  10/06/2023, 3:51 PM

## 2023-10-06 NOTE — Progress Notes (Addendum)
PHARMACY - ANTICOAGULATION CONSULT NOTE  Pharmacy Consult for IV heparin Indication: DVT  Allergies  Allergen Reactions   Lithium Other (See Comments)    Spinal fluid built up in brain   Dulaglutide Nausea And Vomiting and Other (See Comments)    TRULICITY   Penicillins Other (See Comments)    UNKNOWN CHILDHOOD REACTION    Patient Measurements: Height: 5\' 2"  (157.5 cm) Weight: 84.5 kg (186 lb 4.6 oz) IBW/kg (Calculated) : 50.1 Heparin Dosing Weight: 70 kg  Vital Signs: Temp: 99.2 F (37.3 C) (12/11 2346) Temp Source: Oral (12/11 2346) BP: 146/65 (12/11 2346) Pulse Rate: 103 (12/11 2346)  Labs: Recent Labs    10/05/23 1026 10/05/23 1122 10/05/23 1736 10/06/23 0035  HGB 11.5*  --   --  11.6*  HCT 34.4*  --   --  34.2*  PLT 301  --   --  294  APTT  --  28  --   --   LABPROT  --  14.2  --   --   INR  --  1.1  --   --   HEPARINUNFRC  --  <0.10* 0.47 0.28*  CREATININE 0.77  --   --  0.57    Estimated Creatinine Clearance: 81.1 mL/min (by C-G formula based on SCr of 0.57 mg/dL).   Medical History: Past Medical History:  Diagnosis Date   Anginal pain (HCC)    Anxiety    Arthritis    Asthma    Bipolar 1 disorder (HCC)    Bipolar disease, chronic (HCC)    Breast cancer in female (HCC)    2019 and recurrent in 2023 to right breast, right axillary, and left neck   Coronary artery disease    Deep vein blood clot of right lower extremity (HCC)    Diabetes mellitus type 2 in obese    Goals of care, counseling/discussion 11/23/2021   Hyperlipidemia    Lymphedema    MI (myocardial infarction) (HCC)    was in [place]   Obesity    Pancreatitis    Personal history of chemotherapy    Personal history of radiation therapy    Sleep apnea     Medications:  Prescribed Lovenox 40 mg subcutaneous q12h yesterday, Last dose 12/10 evening  Assessment: Pharmacy consulted to dose IV heparin for this 57 yo female with a past medical history of CAD, type 2 DM, HLD,  asthma, breast cancer. Arrived today to ED stating she saw her cancer doctor yesterday, was told she was informed she needed to be admitted for a new work up related to a finding of a possible meningioma. Pt reports she has been experiencing ongoing swelling to the left side of her neck and left eye. She states she was told she has a blood clot in her neck as well, she took her first dose of Lovenox last night.   Imaging: 12/5 CT soft tissue neck: thrombosis of the left jugular vein which is associated with edema of the left face and neck  HL 0.28 subtherapeutic on 1250 units/hr Hgb 11.6, plts WNL No bleeding noted per RN  Goal of Therapy:  Heparin level 0.3-0.7 units/ml Monitor platelets by anticoagulation protocol: Yes   Plan:  Increase heparin drip to 1400 units/hr Heparin level in 6 hours Monitor daily heparin level, CBC, signs/symptoms of bleeding   Thank you for allowing pharmacy to be a part of this patient's care.  Arley Phenix RPh 10/06/2023, 2:16 AM

## 2023-10-06 NOTE — Progress Notes (Signed)
PHARMACY - ANTICOAGULATION CONSULT NOTE  Pharmacy Consult for Heparin Indication: left jugular vein thrombosis  Allergies  Allergen Reactions   Lithium Other (See Comments)    Spinal fluid built up in brain   Dulaglutide Nausea And Vomiting and Other (See Comments)    TRULICITY   Penicillins Other (See Comments)    UNKNOWN CHILDHOOD REACTION    Patient Measurements: Height: 5\' 2"  (157.5 cm) Weight: 84.5 kg (186 lb 4.6 oz) IBW/kg (Calculated) : 50.1 Heparin Dosing Weight: 70 kg  Vital Signs: Temp: 98.4 F (36.9 C) (12/12 1559) Temp Source: Oral (12/12 1559) BP: 152/96 (12/12 1559) Pulse Rate: 98 (12/12 1559)  Labs: Recent Labs    10/05/23 1026 10/05/23 1122 10/05/23 1736 10/06/23 0035 10/06/23 0956 10/06/23 1807  HGB 11.5*  --   --  11.6*  --   --   HCT 34.4*  --   --  34.2*  --   --   PLT 301  --   --  294  --   --   APTT  --  28  --   --   --   --   LABPROT  --  14.2  --   --   --   --   INR  --  1.1  --   --   --   --   HEPARINUNFRC  --  <0.10*   < > 0.28* 0.13* 0.55  CREATININE 0.77  --   --  0.57  --   --    < > = values in this interval not displayed.    Estimated Creatinine Clearance: 81.1 mL/min (by C-G formula based on SCr of 0.57 mg/dL).   Medical History: Past Medical History:  Diagnosis Date   Anginal pain (HCC)    Anxiety    Arthritis    Asthma    Bipolar 1 disorder (HCC)    Bipolar disease, chronic (HCC)    Breast cancer in female (HCC)    2019 and recurrent in 2023 to right breast, right axillary, and left neck   Coronary artery disease    Deep vein blood clot of right lower extremity (HCC)    Diabetes mellitus type 2 in obese    Goals of care, counseling/discussion 11/23/2021   Hyperlipidemia    Lymphedema    MI (myocardial infarction) (HCC)    was in [place]   Obesity    Pancreatitis    Personal history of chemotherapy    Personal history of radiation therapy    Sleep apnea     Assessment: AC/Heme: left jugular vein  thrombosis - 12/12 AM: off for PAC so HL 0.13 expectedly low>>resumed. - PM Hep level 0.55 now in goal range.  Goal of Therapy:  Heparin level 0.3-0.7 units/ml Monitor platelets by anticoagulation protocol: Yes   Plan:  Con't IV heparin at 1400 units/hr Daily HL and CBC   Emberlyn Burlison S. Merilynn Finland, PharmD, BCPS Clinical Staff Pharmacist Misty Stanley Stillinger 10/06/2023,7:21 PM

## 2023-10-06 NOTE — Progress Notes (Signed)
Patient down in CT sim. Per staff IV pump beeping. Occluded patient side. Unable to flush peripheral IV to right forearm. IV discontinued. Port to right chest already accessed. Blood return noted and heparin switched to port access. Patient tolerated well.

## 2023-10-06 NOTE — Procedures (Signed)
Patient was seen today in IR for portacath evaluation due to port not getting blood return.  Portacath was accessed and flushed well.  No blood return was noted, at this time, a power flush of the portacath was performed. After power flushing portacath, blood return was noted.  Port was then flushed again and blood return noted again.  At this time, portacath was left accessed, and a new, clean dry dressing was placed over access needle.

## 2023-10-07 ENCOUNTER — Other Ambulatory Visit: Payer: Self-pay

## 2023-10-07 ENCOUNTER — Ambulatory Visit
Admit: 2023-10-07 | Discharge: 2023-10-07 | Disposition: A | Discharge status: Home / Self Care | Payer: Medicaid Other | Attending: Radiation Oncology | Admitting: Radiation Oncology

## 2023-10-07 DIAGNOSIS — C50012 Malignant neoplasm of nipple and areola, left female breast: Secondary | ICD-10-CM

## 2023-10-07 DIAGNOSIS — R220 Localized swelling, mass and lump, head: Secondary | ICD-10-CM | POA: Diagnosis not present

## 2023-10-07 LAB — GLUCOSE, CAPILLARY
Glucose-Capillary: 266 mg/dL — ABNORMAL HIGH (ref 70–99)
Glucose-Capillary: 310 mg/dL — ABNORMAL HIGH (ref 70–99)
Glucose-Capillary: 280 mg/dL — ABNORMAL HIGH (ref 70–99)
Glucose-Capillary: 355 mg/dL — ABNORMAL HIGH (ref 70–99)

## 2023-10-07 LAB — RAD ONC ARIA SESSION SUMMARY
Course Elapsed Days: 0
Plan Fractions Treated to Date: 1
Plan Prescribed Dose Per Fraction: 2 Gy
Plan Total Fractions Prescribed: 25
Plan Total Prescribed Dose: 50 Gy
Reference Point Dosage Given to Date: 2 Gy
Reference Point Session Dosage Given: 2 Gy
Session Number: 1

## 2023-10-07 LAB — COMPREHENSIVE METABOLIC PANEL
ALT: 13 U/L (ref 0–44)
AST: 13 U/L — ABNORMAL LOW (ref 15–41)
Albumin: 3.5 g/dL (ref 3.5–5.0)
Alkaline Phosphatase: 78 U/L (ref 38–126)
Anion gap: 12 (ref 5–15)
BUN: 16 mg/dL (ref 6–20)
CO2: 26 mmol/L (ref 22–32)
Calcium: 9.3 mg/dL (ref 8.9–10.3)
Chloride: 97 mmol/L — ABNORMAL LOW (ref 98–111)
Creatinine, Ser: 0.69 mg/dL (ref 0.44–1.00)
GFR, Estimated: >60 mL/min (ref >60)
Glucose, Bld: 270 mg/dL — ABNORMAL HIGH (ref 70–99)
Potassium: 3.5 mmol/L (ref 3.5–5.1)
Sodium: 135 mmol/L (ref 135–145)
Total Bilirubin: 0.4 mg/dL (ref <1.2)
Total Protein: 6.9 g/dL (ref 6.5–8.1)

## 2023-10-07 LAB — CBC
HCT: 34.7 % — ABNORMAL LOW (ref 36.0–46.0)
Hemoglobin: 11.3 g/dL — ABNORMAL LOW (ref 12.0–15.0)
MCH: 30 pg (ref 26.0–34.0)
MCHC: 32.6 g/dL (ref 30.0–36.0)
MCV: 92 fL (ref 80.0–100.0)
Platelets: 365 10*3/uL (ref 150–400)
RBC: 3.77 MIL/uL — ABNORMAL LOW (ref 3.87–5.11)
RDW: 14.7 % (ref 11.5–15.5)
WBC: 9 10*3/uL (ref 4.0–10.5)
nRBC: 0 % (ref 0.0–0.2)

## 2023-10-07 LAB — HEPARIN LEVEL (UNFRACTIONATED)
Heparin Unfractionated: 0.73 [IU]/mL — ABNORMAL HIGH (ref 0.30–0.70)
Heparin Unfractionated: 0.34 [IU]/mL (ref 0.30–0.70)

## 2023-10-07 MED ORDER — GABAPENTIN 300 MG PO CAPS
300.0000 mg | ORAL_CAPSULE | Freq: Three times a day (TID) | ORAL | Status: DC
  Administered 2023-10-07 – 2023-10-12 (×12): 300 mg via ORAL
  Filled 2023-10-07 (×13): qty 1

## 2023-10-07 MED ORDER — SODIUM CHLORIDE 0.9 % IV SOLN
150.0000 mg | Freq: Once | INTRAVENOUS | Status: AC
  Administered 2023-10-07: 150 mg via INTRAVENOUS
  Filled 2023-10-07: qty 5

## 2023-10-07 MED ORDER — SODIUM CHLORIDE 0.9 % IV SOLN: 8.0000 mg | Freq: Four times a day (QID) | INTRAVENOUS | Status: DC | PRN

## 2023-10-07 MED ORDER — PROCHLORPERAZINE EDISYLATE 10 MG/2ML IJ SOLN: 10.0000 mg | Freq: Four times a day (QID) | INTRAMUSCULAR | Status: DC | PRN

## 2023-10-07 MED ORDER — DEXAMETHASONE SODIUM PHOSPHATE 100 MG/10ML IJ SOLN: 10.0000 mg | Freq: Once | INTRAMUSCULAR | Status: DC

## 2023-10-07 MED ORDER — SODIUM CHLORIDE 0.9 % IV SOLN
750.0000 mg/m2/d | INTRAVENOUS | Status: AC
Start: 2023-10-07 — End: 2023-10-11
  Administered 2023-10-07: 5750 mg via INTRAVENOUS
  Filled 2023-10-07: qty 115

## 2023-10-07 MED ORDER — INSULIN ASPART 100 UNIT/ML IJ SOLN
3.0000 [IU] | Freq: Three times a day (TID) | INTRAMUSCULAR | Status: DC
  Administered 2023-10-07 – 2023-10-08 (×3): 3 [IU] via SUBCUTANEOUS

## 2023-10-07 MED ORDER — LINEZOLID 600 MG/300ML IV SOLN
600.0000 mg | Freq: Two times a day (BID) | INTRAVENOUS | Status: DC
  Administered 2023-10-08 (×2): 600 mg via INTRAVENOUS
  Filled 2023-10-07 (×3): qty 300

## 2023-10-07 MED ORDER — BISACODYL 5 MG PO TBEC
5.0000 mg | DELAYED_RELEASE_TABLET | Freq: Once | ORAL | Status: AC
  Administered 2023-10-07: 5 mg via ORAL
  Filled 2023-10-07: qty 1

## 2023-10-07 MED ORDER — MAGNESIUM SULFATE 2 GM/50ML IV SOLN
2.0000 g | Freq: Once | INTRAVENOUS | Status: AC
  Administered 2023-10-07: 2 g via INTRAVENOUS
  Filled 2023-10-07: qty 50

## 2023-10-07 MED ORDER — HYDROMORPHONE HCL 2 MG/ML IJ SOLN
2.0000 mg | INTRAMUSCULAR | Status: DC | PRN
  Administered 2023-10-08: 2 mg via INTRAVENOUS
  Filled 2023-10-07: qty 1

## 2023-10-07 MED ORDER — FENTANYL 50 MCG/HR TD PT72
1.0000 | MEDICATED_PATCH | TRANSDERMAL | Status: DC
  Administered 2023-10-07 – 2023-10-10 (×2): 1 via TRANSDERMAL
  Filled 2023-10-07 (×3): qty 1

## 2023-10-07 MED ORDER — LINEZOLID 600 MG/300ML IV SOLN
600.0000 mg | Freq: Two times a day (BID) | INTRAVENOUS | Status: DC
  Administered 2023-10-07: 600 mg via INTRAVENOUS
  Filled 2023-10-07 (×2): qty 300

## 2023-10-07 MED ORDER — POLYETHYLENE GLYCOL 3350 17 G PO PACK
17.0000 g | PACK | Freq: Two times a day (BID) | ORAL | Status: DC
  Administered 2023-10-07 – 2023-10-12 (×11): 17 g via ORAL
  Filled 2023-10-07 (×11): qty 1

## 2023-10-07 MED ORDER — ENSURE MAX PROTEIN PO LIQD
11.0000 [oz_av] | Freq: Three times a day (TID) | ORAL | Status: DC
  Administered 2023-10-07 – 2023-10-08 (×5): 11 [oz_av] via ORAL
  Filled 2023-10-07 (×9): qty 330

## 2023-10-07 MED ORDER — SODIUM CHLORIDE 0.9 % IV SOLN: INTRAVENOUS | Status: DC

## 2023-10-07 MED ORDER — PROCHLORPERAZINE MALEATE 10 MG PO TABS
10.0000 mg | ORAL_TABLET | Freq: Four times a day (QID) | ORAL | Status: DC | PRN
  Administered 2023-10-08: 10 mg via ORAL
  Filled 2023-10-07: qty 1

## 2023-10-07 MED ORDER — LORAZEPAM 2 MG/ML IJ SOLN
0.5000 mg | Freq: Four times a day (QID) | INTRAMUSCULAR | Status: AC | PRN
Start: 2023-10-07 — End: 2023-10-07
  Administered 2023-10-07: 0.5 mg via INTRAVENOUS
  Filled 2023-10-07: qty 1

## 2023-10-07 MED ORDER — PALONOSETRON HCL INJECTION 0.25 MG/5ML
0.2500 mg | Freq: Once | INTRAVENOUS | Status: AC
  Administered 2023-10-07: 0.25 mg via INTRAVENOUS

## 2023-10-07 MED ORDER — COLD PACK MISC ONCOLOGY: 1.0000 | Freq: Once | Status: AC | PRN

## 2023-10-07 MED ORDER — POTASSIUM CHLORIDE IN NACL 20-0.9 MEQ/L-% IV SOLN
Freq: Once | INTRAVENOUS | Status: AC
  Filled 2023-10-07: qty 1000

## 2023-10-07 MED ORDER — INSULIN GLARGINE-YFGN 100 UNIT/ML ~~LOC~~ SOLN
25.0000 [IU] | Freq: Every day | SUBCUTANEOUS | Status: DC
  Administered 2023-10-08 – 2023-10-09 (×2): 25 [IU] via SUBCUTANEOUS
  Filled 2023-10-07 (×2): qty 0.25

## 2023-10-07 MED ORDER — HYDROMORPHONE HCL 1 MG/ML IJ SOLN
0.5000 mg | INTRAMUSCULAR | Status: DC | PRN
  Administered 2023-10-07 (×5): 1 mg via INTRAVENOUS
  Filled 2023-10-07 (×5): qty 1

## 2023-10-07 MED ORDER — SODIUM CHLORIDE 0.9 % IV SOLN
75.0000 mg/m2 | Freq: Once | INTRAVENOUS | Status: AC
  Administered 2023-10-07: 150 mg via INTRAVENOUS
  Filled 2023-10-07: qty 150

## 2023-10-07 NOTE — Progress Notes (Signed)
    Patient Name: Ariana Herrera           DOB: October 11, 1966  MRN: [id number]      Admission Date: 10/05/2023  Attending Provider: Alba Cory, MD  Primary Diagnosis: Facial swelling   Level of care: Telemetry    CROSS COVER NOTE   Date of Service   10/07/2023   Ariana Herrera, 57 y.o. female, was admitted on 10/05/2023 for Facial swelling and neck pain.   HPI/Events of Note   Uncontrolled pain Neck pain related to left internal jugular vein thrombosis and obstruction with necrotic lymph nodes Currently on IV Dilaudid and oxycodone pain regimen. Patient states pain is well relieved with IV Dilaudid, however relief is very short in duration. She feels that oxycodone is not helping with pain, but is willing to trying it to improve pain management. From 7:30 PM to 12:30 AM (5 hours) patient has required 6 mg IV Dilaudid, 5 mg oxycodone for pain management.   Interventions/ Plan   Dilaudid order modified- 0.5-1 mg IV Dilaudid every 1 hour as needed for severe pain Monitor Dilaudid use for the next couple hours, if use is frequent, patient may benefit from PCA use IV Ativan for anxiety, 0.5 mg        Anthoney Harada, DNP, [employer] [facility] [employer]

## 2023-10-07 NOTE — Progress Notes (Signed)
Thankfully, there is no evidence of disease elsewhere with respect to the breast cancer.  Her CT scans do not show any evidence of disease in the thoracic or abdominal cavity.  She is having pain issues.  This is truly understandable.  She is unsure, has nerve compression over on the left side of her neck.  I think she probably is going to need a Duragesic patch.  I also would put her on some gabapentin as she may have an element of neuropathic pain.  She saw Radiation Oncology yesterday.  They will start radiation on her today.  Will start her on platinum/5-FU infusion.  This really needs to be done as an inpatient given the fact that she has such marked facial swelling and compression of her internal jugular vein and pain..  She continues on the heparin infusion.  We may switch this over to Lovenox.  She is hungry.  I am sure that the Decadron is helping this.  She has had no bleeding.  She has had no problems with bowels or bladder.  There is been no issues with nausea or vomiting.  I went over the chemotherapy side effects with her.  I think we are to have to watch out for nausea.  I have her on Zofran on a as needed basis.  I know she will get aggressive antiemetics with the protocol.  Her vital signs are temperature 98.3.  Pulse 82.  Blood pressure 114/77.  Her head and exam really is unchanged.  She has a lot of firmness and swelling on the left side of her neck.  She does have the periorbital edema.  The edema may not be as prominent.  Lungs are clear bilaterally.  Cardiac exam regular rate and rhythm.  Extremity shows no clubbing, cyanosis or edema.  Neurological exam is nonfocal.  Hopefully, we will be able to get a relatively quick response.  Given that she just has lymph node involvement, we hopefully will be to get a good response.  Nutrition is can be very important for her.  She can eat anything that she wants.  I will make sure she has some supplements.  Again, we may switch  her over from heparin to Lovenox.  I know the staff on 6 E. we will do a great job with her.    Christin Bach, MD  Jeri Modena 17:14

## 2023-10-07 NOTE — Progress Notes (Signed)
Patient had first dose of cisplatin chemotherapy today. She tolerated treatment well with no issues. 52fu PUMP was hung with no issues.

## 2023-10-07 NOTE — Progress Notes (Addendum)
Ok to proceed with Mag and K hydration fluids without magnesium level. Dex 10mg  dc'd from premedications since patient is on Dex 8mg  Q 24 hours.  Anola Gurney [place], [place], BCPS, BCOP 10/07/2023 9:47 AM

## 2023-10-07 NOTE — Progress Notes (Signed)
PHARMACY - ANTICOAGULATION CONSULT NOTE  Pharmacy Consult for IV heparin Indication: DVT  Allergies  Allergen Reactions   Lithium Other (See Comments)    Spinal fluid built up in brain   Dulaglutide Nausea And Vomiting and Other (See Comments)    TRULICITY   Penicillins Other (See Comments)    UNKNOWN CHILDHOOD REACTION    Patient Measurements: Height: 5\' 2"  (157.5 cm) Weight: 84.5 kg (186 lb 4.6 oz) IBW/kg (Calculated) : 50.1 Heparin Dosing Weight: 70 kg  Vital Signs: Temp: 98.3 F (36.8 C) (12/13 0550) Temp Source: Oral (12/13 0550) BP: 114/77 (12/13 0550) Pulse Rate: 82 (12/13 0550)  Labs: Recent Labs    10/05/23 1026 10/05/23 1122 10/05/23 1736 10/06/23 0035 10/06/23 0956 10/06/23 1807 10/07/23 0611  HGB 11.5*  --   --  11.6*  --   --  11.3*  HCT 34.4*  --   --  34.2*  --   --  34.7*  PLT 301  --   --  294  --   --  365  APTT  --  28  --   --   --   --   --   LABPROT  --  14.2  --   --   --   --   --   INR  --  1.1  --   --   --   --   --   HEPARINUNFRC  --  <0.10*   < > 0.28* 0.13* 0.55 0.73*  CREATININE 0.77  --   --  0.57  --   --  0.69   < > = values in this interval not displayed.    Estimated Creatinine Clearance: 81.1 mL/min (by C-G formula based on SCr of 0.69 mg/dL).   Medical History: Past Medical History:  Diagnosis Date   Anginal pain (HCC)    Anxiety    Arthritis    Asthma    Bipolar 1 disorder (HCC)    Bipolar disease, chronic (HCC)    Breast cancer in female (HCC)    2019 and recurrent in 2023 to right breast, right axillary, and left neck   Coronary artery disease    Deep vein blood clot of right lower extremity (HCC)    Diabetes mellitus type 2 in obese    Goals of care, counseling/discussion 11/23/2021   Hyperlipidemia    Lymphedema    MI (myocardial infarction) (HCC)    was in [place]   Obesity    Pancreatitis    Personal history of chemotherapy    Personal history of radiation therapy    Sleep apnea      Medications:  Prescribed Lovenox 40 mg subcutaneous q12h yesterday, Last dose 12/10 evening  Assessment: Pharmacy consulted to dose IV heparin for this 57 yo female with a past medical history of CAD, type 2 DM, HLD, asthma, breast cancer. Arrived today to ED stating she saw her cancer doctor yesterday, was told she was informed she needed to be admitted for a new work up related to a finding of a possible meningioma. Pt reports she has been experiencing ongoing swelling to the left side of her neck and left eye. She states she was told she has a blood clot in her neck as well, she took her first dose of Lovenox last night.   Imaging: 12/5 CT soft tissue neck: thrombosis of the left jugular vein which is associated with edema of the left face and neck  HL 0.73 slightly supratherapeutic  on 1400 units/hr Hgb 11.6, plts WNL No bleeding noted per RN  Goal of Therapy:  Heparin level 0.3-0.7 units/ml Monitor platelets by anticoagulation protocol: Yes   Plan:  Decrease  heparin drip to 1300 units/hr Obtain Heparin level  6 hours after rate change  Monitor daily heparin level, CBC, signs/symptoms of bleeding    Adalberto Cole, PharmD, BCPS 10/07/2023 8:32 AM

## 2023-10-07 NOTE — Progress Notes (Signed)
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 Myna Hidalgo who presents with worsening neck swelling which show multiple necrotic lymphadenopathy and IJ vein thrombosis   Assessment & Plan:   Principal Problem:   Facial swelling Active Problems:   Breast cancer (HCC)   Type 2 diabetes mellitus with hyperglycemia, with long-term current use of insulin (HCC)   Obesity (BMI 30-39.9)   Internal jugular (IJ) vein thromboembolism, acute (HCC)   1-Left internal jugular vein thrombosis and obstruction with necrotic lymph nodes In the setting of metastatic breast cancer -Continue with heparin drip---  -Continue with IV Decadron -Radiation oncology consulted, patient underwent  CT simulation.  They anticipate 5 weeks of radiation to the left neck -Left periorbital edema appear more red, started antibiotics, change to Linezolid today.  Plan to start radiation tx today.    2-7 mm dural based enhancing lesion along the left medial frontal gyrus, favored to reflect a small meningioma; -Follow Dr. Myna Hidalgo recommendation   3-Diabetes type 2 with hyperglycemia uncontrolled Suspect will get worsening hyperglycemia in the setting of IV Decadron Started  Semglee 20 units daily,  sliding scale insulin. Will add meal coverage. Increase semglee.    Recurrent left ductal carcinoma of breast cancer;  Followed by Dr. Myna Hidalgo now with concern of metastatic disease to the neck Dr. Myna Hidalgo consulted Patient will need Port-A-Cath and plan is to start chemotherapy CT abdomen and pelvis obtained: No evidence of distant metastatic disease in the chest, abdomen or pelvis.  Hyperlipidemia: continue fenobrite.  Continue with Crestor as advised by her cardiology     Hypertension: Continue with metoprolol Pseudohyponatremia.  Constipation; start bowel regimen.   Obesity type 1; needs weight loss Estimated body mass index is 34.07 kg/m as calculated from the following:   Height as of this encounter: 5\' 2"  (1.575 m).   Weight as of this encounter: 84.5 kg.   DVT prophylaxis: Heparin drip Code Status: Full code Family Communication: Care discussed with patient Disposition Plan:  Status is: Inpatient Remains inpatient appropriate because: Management of IJ thrombosis and cervical metastasis disease    Consultants:  Dr. Myna Hidalgo Dr. Roselind Messier radiation oncology IR  Procedures:    Antimicrobials:    Subjective: Had issues overnight with pain controlled. Pain better this am.  Asking when she can go home.   Objective: Vitals:   10/06/23 1559 10/06/23 2118 10/06/23 2303 10/07/23 0550  BP: (!) 152/96 (!) 135/97 (!) 141/94 114/77  Pulse: 98 93 96 82  Resp:  18 19 20   Temp: 98.4 F (36.9 C) 98.4 F (36.9 C) 98 F (36.7 C) 98.3 F (36.8 C)  TempSrc: Oral Oral Oral Oral  SpO2: 98% 98% 100% 94%  Weight:      Height:        Intake/Output Summary (Last 24 hours) at 10/07/2023 1401 Last data filed at 10/07/2023 1228 Gross per 24 hour  Intake 1023.6 ml  Output 300 ml  Net 723.6 ml   Filed Weights   10/05/23 0938 10/05/23 1640  Weight: 87.1 kg 84.5 kg    Examination:  General exam: Appears calm and comfortable, less periorbital edema, less  redness.  Respiratory system: CTA Cardiovascular system: S 1, S  2 RRR Gastrointestinal system:  BS present, soft, nt Central nervous system: Alert Extremities: no edema  Data Reviewed: I have personally reviewed following labs and imaging studies  CBC: Recent Labs  Lab 10/05/23 1026 10/06/23 0035 10/07/23 0611  WBC 5.6 9.4 9.0  NEUTROABS 4.0  --   --   HGB 11.5* 11.6* 11.3*  HCT 34.4* 34.2* 34.7*  MCV 93.5 91.9 92.0  PLT 301 294 365   Basic Metabolic Panel: Recent Labs  Lab  10/05/23 1026 10/06/23 0035 10/07/23 0611  NA 135 133* 135  K 4.2 4.1 3.5  CL 103 101 97*  CO2 23 22 26   GLUCOSE 286* 240* 270*  BUN 15 11 16   CREATININE 0.77 0.57 0.69  CALCIUM 8.6* 8.6* 9.3   GFR: Estimated Creatinine Clearance: 81.1 mL/min (by C-G formula based on SCr of 0.69 mg/dL). Liver Function Tests: Recent Labs  Lab 10/05/23 1026 10/06/23 0035 10/07/23 0611  AST 15 12* 13*  ALT 13 13 13   ALKPHOS 89 87 78  BILITOT 0.4 0.5 0.4  PROT 6.8 6.4* 6.9  ALBUMIN 3.4* 3.3* 3.5   No results for input(s): "LIPASE", "AMYLASE" in the last 168 hours. No results for input(s): "AMMONIA" in the last 168 hours. Coagulation Profile: Recent Labs  Lab 10/05/23 1122  INR 1.1   Cardiac Enzymes: No results for input(s): "CKTOTAL", "CKMB", "CKMBINDEX", "TROPONINI" in the last 168 hours. BNP (last 3 results) No results for input(s): "PROBNP" in the last 8760 hours. HbA1C: No results for input(s): "HGBA1C" in the last 72 hours. CBG: Recent Labs  Lab 10/06/23 1144 10/06/23 1653 10/06/23 2116 10/07/23 0722 10/07/23 1212  GLUCAP 199* 284* 238* 266* 310*   Lipid Profile: No results for input(s): "CHOL", "HDL", "LDLCALC", "TRIG", "CHOLHDL", "LDLDIRECT" in the last 72 hours. Thyroid Function Tests: No results for input(s): "TSH", "T4TOTAL", "FREET4", "T3FREE", "THYROIDAB" in the last 72 hours. Anemia Panel: No results for input(s): "VITAMINB12", "FOLATE", "FERRITIN", "TIBC", "IRON", "RETICCTPCT" in the last 72 hours. Sepsis Labs: No results for input(s): "PROCALCITON", "LATICACIDVEN" in the last 168 hours.  No results found for this or any previous visit (from the past 240 hours).       Radiology Studies: CT CHEST ABDOMEN PELVIS W CONTRAST Result Date: 10/06/2023 CLINICAL DATA:  Metastatic breast cancer with recent left breast recurrence. Assess treatment response. * Tracking Code: BO * EXAM: CT CHEST, ABDOMEN, AND PELVIS WITH CONTRAST TECHNIQUE: Multidetector CT imaging  of the chest, abdomen and pelvis was performed following the standard protocol during bolus administration of intravenous contrast. RADIATION DOSE REDUCTION: This exam was performed according to the departmental dose-optimization program which includes automated exposure control, adjustment of the mA and/or kV according to patient size and/or use of iterative reconstruction technique. CONTRAST:  OMNIPAQUE IOHEXOL 300 MG/ML  SOLN COMPARISON:  CTA of the chest, abdomen and pelvis 08/20/2020. PET-CT 08/04/2023 and 03/04/2023. Ultrasound 06/28/2023. FINDINGS: CT CHEST FINDINGS Cardiovascular: Right IJ Port-A-Cath extends to the lower SVC level. No acute vascular findings are demonstrated. There is mild atherosclerosis of the aorta, great vessels and coronary arteries. The heart size is normal. There is no pericardial effusion. Mediastinum/Nodes: There are no enlarged mediastinal, hilar, axillary or internal mammary lymph nodes. There are apparent surgical changes in the left supraclavicular area with irregular subcutaneous nodularity, similar to most recent PET-CT and not hypermetabolic, although not seen on the 03/04/2023 PET-CT. Low-density left supraclavicular nodule measuring 2.0 x 1.2 cm on image 5/2 is grossly unchanged from previous PET-CT as well, not hypermetabolic. The thyroid gland, trachea and esophagus demonstrate no significant findings. Lungs/Pleura: No pleural effusion or pneumothorax.  There are stable subpleural radiation changes anteriorly in the left upper lobe. Mild central airway thickening noted. No focal airspace disease or suspicious pulmonary nodularity. Musculoskeletal/Chest wall: Grossly stable postsurgical changes in the left breast with diffuse dermal and trabecular thickening, similar to recent PET-CT. New soft tissue emphysema laterally in the left breast attributed to recent biopsy. No other chest wall mass or suspicious osseous finding. CT ABDOMEN AND PELVIS FINDINGS Hepatobiliary:  The liver is normal in density without suspicious focal abnormality. No significant biliary dilatation status post cholecystectomy. Pancreas: Generalized atrophy. No focal mass lesion, ductal dilatation or surrounding inflammation. Spleen: Normal in size without focal abnormality. Adrenals/Urinary Tract: Both adrenal glands appear normal. No evidence of urinary tract calculus, suspicious renal lesion or hydronephrosis. Mild renal cortical scarring bilaterally. No bladder abnormality identified. Stomach/Bowel: No enteric contrast administered. The stomach appears unremarkable for its degree of distension. No evidence of bowel wall thickening, distention or surrounding inflammatory change. The appendix appears normal. There is prominent stool throughout the colon. Vascular/Lymphatic: There are no enlarged abdominal or pelvic lymph nodes. Diffuse aortic and branch vessel atherosclerosis without evidence of aneurysm or large vessel occlusion. There is mild diffuse common iliac artery luminal narrowing bilaterally. Reproductive: The uterus and ovaries appear unremarkable. No adnexal mass. Other: Small periumbilical hernia containing only fat. No ascites or peritoneal nodularity. No pneumoperitoneum. Musculoskeletal: No acute or significant osseous findings. IMPRESSION: 1. Possible postsurgical changes in the left supraclavicular area, similar to PET-CT of 2 months ago but new from 03/04/2023. There was no significant hypermetabolic activity in these areas at recent PET-CT, although metastatic disease is a concern, especially if there has been no prior surgery in this area. The patient did present for ultrasound with a palpable abnormality in this region 3 months ago. Consider dedicated CT of the neck or follow up PET-CT. 2. Postsurgical changes in the left breast, grossly stable. 3. No evidence of distant metastatic disease in the chest, abdomen or pelvis. 4. Prominent stool throughout the colon suggesting constipation.  5.  Aortic Atherosclerosis (ICD10-I70.0). Electronically Signed   By: Carey Bullocks M.D.   On: 10/06/2023 12:40   IR PATIENT EVAL TECH 0-60 MINS Result Date: 10/06/2023 Barrett Shell, RT     10/06/2023  9:13 AM Patient was seen today in IR for portacath evaluation due to port not getting blood return.  Portacath was accessed and flushed well.  No blood return was noted, at this time, a power flush of the portacath was performed. After power flushing portacath, blood return was noted.  Port was then flushed again and blood return noted again.  At this time, portacath was left accessed, and a new, clean dry dressing was placed over access needle.  DG Chest 2 View Result Date: 10/05/2023 CLINICAL DATA:  facial swelling, neck swelling, IJV thrombosis, known breast cancer, chest mass?Marland Kitchen EXAM: CHEST - 2 VIEW COMPARISON:  06/12/2023. FINDINGS: Bilateral lung fields are clear. Bilateral costophrenic angles are clear. Normal cardio-mediastinal silhouette. No acute osseous abnormalities. The soft tissues are within normal limits. Right-sided CT Port-A-Cath is again seen with its tip overlying the cavoatrial junction region. IMPRESSION: No active cardiopulmonary disease. Electronically Signed   By: Jules Schick M.D.   On: 10/05/2023 15:14        Scheduled Meds:  Chlorhexidine Gluconate Cloth  6 each Topical Daily   CISplatin  75 mg/m2 (Treatment Plan Recorded) Intravenous Once   dexamethasone (DECADRON) injection  8 mg Intravenous Q24H   fentaNYL  1 patch Transdermal Q72H  fluorouracil  750 mg/m2/day (Treatment Plan Recorded) Intravenous 4 days   gabapentin  300 mg Oral TID   insulin aspart  0-20 Units Subcutaneous TID WC   insulin aspart  0-5 Units Subcutaneous QHS   insulin aspart  3 Units Subcutaneous TID WC   insulin glargine-yfgn  20 Units Subcutaneous Daily   lidocaine  1 Application Topical Once   metoprolol tartrate  25 mg Oral BID   nicotine  21 mg Transdermal Daily   oxybutynin  5  mg Oral QHS   Ensure Max Protein  11 oz Oral TID BM   QUEtiapine  800 mg Oral QHS   rosuvastatin  40 mg Oral Daily   senna  1 tablet Oral BID   Continuous Infusions:  sodium chloride 10 mL/hr at 10/07/23 1153   heparin 1,300 Units/hr (10/07/23 0844)   linezolid (ZYVOX) IV     [START ON 10/10/2023] ondansetron (ZOFRAN) IV       LOS: 2 days    Time spent: 35 minutes   Grey Rakestraw A Salem Mastrogiovanni, MD [facility]   If 7PM-7AM, please contact night-coverage [email or web address]  10/07/2023, 2:01 PM

## 2023-10-07 NOTE — Progress Notes (Signed)
PHARMACY - ANTICOAGULATION CONSULT NOTE  Pharmacy Consult for Heparin Indication: left jugular vein thrombosis  Allergies  Allergen Reactions   Lithium Other (See Comments)    Spinal fluid built up in brain   Dulaglutide Nausea And Vomiting and Other (See Comments)    TRULICITY   Penicillins Other (See Comments)    UNKNOWN CHILDHOOD REACTION    Patient Measurements: Height: 5\' 2"  (157.5 cm) Weight: 84.5 kg (186 lb 4.6 oz) IBW/kg (Calculated) : 50.1 Heparin Dosing Weight: 70 kg  Vital Signs: Temp: 98.5 F (36.9 C) (12/13 1416) Temp Source: Oral (12/13 1416) BP: 101/65 (12/13 1416) Pulse Rate: 86 (12/13 1416)  Labs: Recent Labs    10/05/23 1026 10/05/23 1122 10/05/23 1736 10/06/23 0035 10/06/23 0956 10/06/23 1807 10/07/23 0611 10/07/23 1625  HGB 11.5*  --   --  11.6*  --   --  11.3*  --   HCT 34.4*  --   --  34.2*  --   --  34.7*  --   PLT 301  --   --  294  --   --  365  --   APTT  --  28  --   --   --   --   --   --   LABPROT  --  14.2  --   --   --   --   --   --   INR  --  1.1  --   --   --   --   --   --   HEPARINUNFRC  --  <0.10*   < > 0.28*   < > 0.55 0.73* 0.34  CREATININE 0.77  --   --  0.57  --   --  0.69  --    < > = values in this interval not displayed.    Estimated Creatinine Clearance: 81.1 mL/min (by C-G formula based on SCr of 0.69 mg/dL).   Medical History: Past Medical History:  Diagnosis Date   Anginal pain (HCC)    Anxiety    Arthritis    Asthma    Bipolar 1 disorder (HCC)    Bipolar disease, chronic (HCC)    Breast cancer in female (HCC)    2019 and recurrent in 2023 to right breast, right axillary, and left neck   Coronary artery disease    Deep vein blood clot of right lower extremity (HCC)    Diabetes mellitus type 2 in obese    Goals of care, counseling/discussion 11/23/2021   Hyperlipidemia    Lymphedema    MI (myocardial infarction) (HCC)    was in [place]   Obesity    Pancreatitis    Personal history of  chemotherapy    Personal history of radiation therapy    Sleep apnea     Assessment: AC/Heme: left jugular vein thrombosis Hep level 0.73 now 0.34 in goal.  Goal of Therapy:  Heparin level 0.3-0.7 units/ml Monitor platelets by anticoagulation protocol: Yes   Plan:  Con't IV heparin 1300 units/hr Daily HL and CBC   Ariana Herrera S. Merilynn Finland, PharmD, BCPS Clinical Staff Pharmacist Misty Stanley Stillinger 10/07/2023,4:49 PM

## 2023-10-07 NOTE — Progress Notes (Signed)
Ok to run post hydration fluids with Cisplatin today Per Dr. Myna Hidalgo.

## 2023-10-08 ENCOUNTER — Other Ambulatory Visit: Payer: Self-pay

## 2023-10-08 DIAGNOSIS — R220 Localized swelling, mass and lump, head: Secondary | ICD-10-CM | POA: Diagnosis not present

## 2023-10-08 LAB — CBC
HCT: 36.4 % (ref 36.0–46.0)
Hemoglobin: 12.1 g/dL (ref 12.0–15.0)
MCH: 31.3 pg (ref 26.0–34.0)
MCHC: 33.2 g/dL (ref 30.0–36.0)
MCV: 94.3 fL (ref 80.0–100.0)
Platelets: 337 K/uL (ref 150–400)
RBC: 3.86 MIL/uL — ABNORMAL LOW (ref 3.87–5.11)
RDW: 14.7 % (ref 11.5–15.5)
WBC: 11.4 K/uL — ABNORMAL HIGH (ref 4.0–10.5)
nRBC: 0.2 % (ref 0.0–0.2)

## 2023-10-08 LAB — GLUCOSE, CAPILLARY
Glucose-Capillary: 438 mg/dL — ABNORMAL HIGH (ref 70–99)
Glucose-Capillary: 365 mg/dL — ABNORMAL HIGH (ref 70–99)
Glucose-Capillary: 286 mg/dL — ABNORMAL HIGH (ref 70–99)
Glucose-Capillary: 314 mg/dL — ABNORMAL HIGH (ref 70–99)
Glucose-Capillary: 288 mg/dL — ABNORMAL HIGH (ref 70–99)
Glucose-Capillary: 228 mg/dL — ABNORMAL HIGH (ref 70–99)
Glucose-Capillary: 202 mg/dL — ABNORMAL HIGH (ref 70–99)

## 2023-10-08 LAB — COMPREHENSIVE METABOLIC PANEL
ALT: 15 U/L (ref 0–44)
AST: 13 U/L — ABNORMAL LOW (ref 15–41)
Albumin: 3.3 g/dL — ABNORMAL LOW (ref 3.5–5.0)
Alkaline Phosphatase: 73 U/L (ref 38–126)
Anion gap: 8 (ref 5–15)
BUN: 18 mg/dL (ref 6–20)
CO2: 24 mmol/L (ref 22–32)
Calcium: 8.9 mg/dL (ref 8.9–10.3)
Chloride: 100 mmol/L (ref 98–111)
Creatinine, Ser: 0.7 mg/dL (ref 0.44–1.00)
GFR, Estimated: >60 mL/min (ref >60)
Glucose, Bld: 422 mg/dL — ABNORMAL HIGH (ref 70–99)
Potassium: 3.8 mmol/L (ref 3.5–5.1)
Sodium: 132 mmol/L — ABNORMAL LOW (ref 135–145)
Total Bilirubin: 0.4 mg/dL (ref <1.2)
Total Protein: 6.3 g/dL — ABNORMAL LOW (ref 6.5–8.1)

## 2023-10-08 LAB — GLUCOSE, RANDOM: Glucose, Bld: 449 mg/dL — ABNORMAL HIGH (ref 70–99)

## 2023-10-08 MED ORDER — HYDROMORPHONE HCL 1 MG/ML IJ SOLN: 1.0000 mg | INTRAMUSCULAR | Status: DC | PRN

## 2023-10-08 MED ORDER — SODIUM CHLORIDE 0.9 % IV BOLUS
500.0000 mL | Freq: Once | INTRAVENOUS | Status: AC
  Administered 2023-10-08: 500 mL via INTRAVENOUS

## 2023-10-08 MED ORDER — HYDROMORPHONE HCL 1 MG/ML IJ SOLN
1.0000 mg | INTRAMUSCULAR | Status: DC | PRN
  Administered 2023-10-09 – 2023-10-11 (×8): 1 mg via INTRAVENOUS
  Filled 2023-10-08 (×9): qty 1

## 2023-10-08 MED ORDER — SODIUM CHLORIDE 0.9 % IV SOLN
8.0000 mg | Freq: Three times a day (TID) | INTRAVENOUS | Status: DC
  Filled 2023-10-08: qty 4

## 2023-10-08 MED ORDER — INSULIN ASPART 100 UNIT/ML IJ SOLN
5.0000 [IU] | Freq: Three times a day (TID) | INTRAMUSCULAR | Status: DC
  Administered 2023-10-08 – 2023-10-09 (×3): 5 [IU] via SUBCUTANEOUS

## 2023-10-08 MED ORDER — DEXAMETHASONE SODIUM PHOSPHATE 10 MG/ML IJ SOLN
6.0000 mg | INTRAMUSCULAR | Status: DC
  Administered 2023-10-08 – 2023-10-11 (×4): 6 mg via INTRAVENOUS
  Filled 2023-10-08 (×4): qty 1

## 2023-10-08 MED ORDER — ENOXAPARIN SODIUM 80 MG/0.8ML IJ SOSY
80.0000 mg | PREFILLED_SYRINGE | Freq: Two times a day (BID) | INTRAMUSCULAR | Status: DC
  Administered 2023-10-08 – 2023-10-12 (×10): 80 mg via SUBCUTANEOUS
  Filled 2023-10-08 (×10): qty 0.8

## 2023-10-08 MED ORDER — GUAIFENESIN-DM 100-10 MG/5ML PO SYRP
5.0000 mL | ORAL_SOLUTION | ORAL | Status: DC | PRN
  Administered 2023-10-08: 5 mL via ORAL
  Filled 2023-10-08 (×2): qty 5

## 2023-10-08 NOTE — Progress Notes (Signed)
Ariana Herrera is feeling better.  She is not having any pain.  I think we will probably cut back on the hydromorphone.  She now is on the Duragesic patch..  We also have her on the gabapentin.  She did well with the cisplatin.  She is on infusional 5-fluorouracil right now.  I will switch her over to Lovenox now.  I think she has Lovenox at home.  There is no lab work back yet.  Will get labs on her tomorrow.  She has had a decent appetite.  She is eating okay.  She is having no dysphagia or odynophagia.  She has had a little bit of a cough.  It is slightly productive.  She is a little constipated.  She is on Senokot.  There is no bleeding.  She is out of bed.  She had her first radiation yesterday.  Her vital signs are temperature of 98.3.  Pulse 84.  Blood pressure 151/88.  Head and neck exam shows no intraoral lesions.  She does have the firmness on the left side of the neck.  She has a periorbital edema.  Her lungs are clear bilaterally.  She has good air movement bilaterally.  Cardiac exam regular rate and rhythm.  Abdomen is soft.  Bowel sounds are present.  There is no fluid wave.  There is no palpable liver or spleen tip.  Extremity shows no clubbing, cyanosis or edema.  Neurological exam shows no focal neurological deficits.   We now have her on treatment.  She is getting the infusional 5-fluorouracil.  She has cisplatin yesterday.  Hopefully, we will start to see some improvement in the swelling.  Nutrition is can be critical.  Hopefully, she will continue to eat well.  I know that she is getting fantastic care from everybody upon 6 E.   Christin Bach, MD Duwayne Heck 11:1-6

## 2023-10-08 NOTE — Progress Notes (Signed)
PHARMACY - ANTICOAGULATION CONSULT NOTE  Pharmacy Consult for enoxaparin Indication: left jugular vein thrombosis  Allergies  Allergen Reactions   Lithium Other (See Comments)    Spinal fluid built up in brain   Dulaglutide Nausea And Vomiting and Other (See Comments)    TRULICITY   Penicillins Other (See Comments)    UNKNOWN CHILDHOOD REACTION   Patient Measurements: Height: 5\' 2"  (157.5 cm) Weight: 84.5 kg (186 lb 4.6 oz) IBW/kg (Calculated) : 50.1  Vital Signs: Temp: 98.3 F (36.8 C) (12/14 0551) Temp Source: Oral (12/14 0551) BP: 151/88 (12/14 0551) Pulse Rate: 84 (12/14 0551)  Labs: Recent Labs    10/05/23 1026 10/05/23 1122 10/05/23 1736 10/06/23 0035 10/06/23 0956 10/06/23 1807 10/07/23 0611 10/07/23 1625  HGB 11.5*  --   --  11.6*  --   --  11.3*  --   HCT 34.4*  --   --  34.2*  --   --  34.7*  --   PLT 301  --   --  294  --   --  365  --   APTT  --  28  --   --   --   --   --   --   LABPROT  --  14.2  --   --   --   --   --   --   INR  --  1.1  --   --   --   --   --   --   HEPARINUNFRC  --  <0.10*   < > 0.28*   < > 0.55 0.73* 0.34  CREATININE 0.77  --   --  0.57  --   --  0.69  --    < > = values in this interval not displayed.    Estimated Creatinine Clearance: 81.1 mL/min (by C-G formula based on SCr of 0.69 mg/dL).   Medical History: Past Medical History:  Diagnosis Date   Anginal pain (HCC)    Anxiety    Arthritis    Asthma    Bipolar 1 disorder (HCC)    Bipolar disease, chronic (HCC)    Breast cancer in female (HCC)    2019 and recurrent in 2023 to right breast, right axillary, and left neck   Coronary artery disease    Deep vein blood clot of right lower extremity (HCC)    Diabetes mellitus type 2 in obese    Goals of care, counseling/discussion 11/23/2021   Hyperlipidemia    Lymphedema    MI (myocardial infarction) (HCC)    was in [place]   Obesity    Pancreatitis    Personal history of chemotherapy    Personal history of  radiation therapy    Sleep apnea    Assessment: 57 yo F w/ recurrent carcinoma of breast now with a L internal jugular vein thrombosis. She was on enoxaparin 40mg  [place] Q12h prior to admission. Will need to increase for treatment dose. CBC stable.  Goal of Therapy:  Monitor platelets by anticoagulation protocol: Yes   Plan:  Stop heparin infusion Start enoxaparin 80mg  [place] Q12h Monitor CBC, s/s of bleed  At discharge she could use two 40mg  syringes every 12 hours if she still has more enoxaparin at home before switching to an 80mg  syringe which would be less injections.  Enzo Bi, PharmD, BCPS, BCIDP Clinical Pharmacist 10/08/2023 7:34 AM

## 2023-10-08 NOTE — Plan of Care (Signed)

## 2023-10-08 NOTE — Progress Notes (Signed)
Pt calm resting in bed  no acute distress noted  pt medicated per order    pt has no concerns at this time.  Safety measures remain in place  Handoff completed at bedside with [facility]

## 2023-10-08 NOTE — Progress Notes (Signed)
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  They anticipate 5 weeks of radiation to the left neck -Covering for cellulitis periorbital as well with linezolid.  -Started radiation 12/13.   2-7 mm dural based enhancing lesion along the left medial frontal gyrus, favored to reflect a small meningioma; -Follow Dr. Myna Hidalgo recommendation   3-Diabetes type 2 with hyperglycemia uncontrolled Suspect will get worsening hyperglycemia in the setting of IV Decadron Semglee increase to 25 units. Increase meal coverage to 5 units. SSI>  Reduce steroids.   Recurrent left ductal carcinoma of breast cancer;  -Followed by Dr. Myna Hidalgo now with concern of metastatic disease to the neck -Dr. Myna Hidalgo consulted -Patient will need Port-A-Cath and plan is to start chemotherapy -CT abdomen and pelvis obtained: No evidence of distant metastatic disease in the chest, abdomen or pelvis.  Hyperlipidemia:  -Continue fenobrite.   -Continue with Crestor as advised by her cardiology    Hypertension: Continue with  metoprolol Pseudohyponatremia.  Constipation; Start bowel regimen.   Obesity type 1; needs weight loss Estimated body mass index is 34.07 kg/m as calculated from the following:   Height as of this encounter: 5\' 2"  (1.575 m).   Weight as of this encounter: 84.5 kg.   DVT prophylaxis: Heparin drip Code Status: Full code Family Communication: Care discussed with patient Disposition Plan:  Status is: Inpatient Remains inpatient appropriate because: Management of IJ thrombosis and cervical metastasis disease    Consultants:  Dr. Myna Hidalgo Dr. Roselind Messier radiation oncology IR  Procedures:    Antimicrobials:    Subjective: Report pain is better, left eye, still having neck pain   Objective: Vitals:   10/07/23 2006 10/07/23 2202 10/08/23 0551 10/08/23 1059  BP: 108/87 110/73 (!) 151/88 (!) 129/90  Pulse: (!) 103 92 84 92  Resp: 18  18   Temp: 98.6 F (37 C)  98.3 F (36.8 C) 98.2 F (36.8 C)  TempSrc: Oral  Oral Oral  SpO2: 95%  93%   Weight:      Height:        Intake/Output Summary (Last 24 hours) at 10/08/2023 1146 Last data filed at 10/08/2023 0300 Gross per 24 hour  Intake 411.35 ml  Output --  Net 411.35 ml   Filed Weights   10/05/23 0938 10/05/23 1640  Weight: 87.1 kg 84.5 kg    Examination:  General exam: NAD, left periorbita edema less pronounced.  Respiratory system: CTA Cardiovascular system: S 1, S 12 RRR Gastrointestinal system:  BS Present, soft, nt Central nervous system: Alert Extremities: no edema   Data Reviewed: I have personally reviewed following labs and imaging studies  CBC: Recent Labs  Lab 10/05/23  1026 10/06/23 0035 10/07/23 0611 10/08/23 0748  WBC 5.6 9.4 9.0 11.4*  NEUTROABS 4.0  --   --   --   HGB 11.5* 11.6* 11.3* 12.1  HCT 34.4* 34.2* 34.7* 36.4  MCV 93.5 91.9 92.0 94.3  PLT 301 294 365 337   Basic Metabolic Panel: Recent Labs  Lab 10/05/23 1026 10/06/23 0035 10/07/23 0611 10/08/23 0748 10/08/23 0857  NA  135 133* 135 132*  --   K 4.2 4.1 3.5 3.8  --   CL 103 101 97* 100  --   CO2 23 22 26 24   --   GLUCOSE 286* 240* 270* 422* 449*  BUN 15 11 16 18   --   CREATININE 0.77 0.57 0.69 0.70  --   CALCIUM 8.6* 8.6* 9.3 8.9  --    GFR: Estimated Creatinine Clearance: 81.1 mL/min (by C-G formula based on SCr of 0.7 mg/dL). Liver Function Tests: Recent Labs  Lab 10/05/23 1026 10/06/23 0035 10/07/23 0611 10/08/23 0748  AST 15 12* 13* 13*  ALT 13 13 13 15   ALKPHOS 89 87 78 73  BILITOT 0.4 0.5 0.4 0.4  PROT 6.8 6.4* 6.9 6.3*  ALBUMIN 3.4* 3.3* 3.5 3.3*   No results for input(s): "LIPASE", "AMYLASE" in the last 168 hours. No results for input(s): "AMMONIA" in the last 168 hours. Coagulation Profile: Recent Labs  Lab 10/05/23 1122  INR 1.1   Cardiac Enzymes: No results for input(s): "CKTOTAL", "CKMB", "CKMBINDEX", "TROPONINI" in the last 168 hours. BNP (last 3 results) No results for input(s): "PROBNP" in the last 8760 hours. HbA1C: No results for input(s): "HGBA1C" in the last 72 hours. CBG: Recent Labs  Lab 10/07/23 1212 10/07/23 1628 10/07/23 2146 10/08/23 0839 10/08/23 1056  GLUCAP 310* 280* 355* 438* 365*   Lipid Profile: No results for input(s): "CHOL", "HDL", "LDLCALC", "TRIG", "CHOLHDL", "LDLDIRECT" in the last 72 hours. Thyroid Function Tests: No results for input(s): "TSH", "T4TOTAL", "FREET4", "T3FREE", "THYROIDAB" in the last 72 hours. Anemia Panel: No results for input(s): "VITAMINB12", "FOLATE", "FERRITIN", "TIBC", "IRON", "RETICCTPCT" in the last 72 hours. Sepsis Labs: No results for input(s): "PROCALCITON", "LATICACIDVEN" in the last 168 hours.  No results found for this or any previous visit (from the past 240 hours).       Radiology Studies: No results found.       Scheduled Meds:  Chlorhexidine Gluconate Cloth  6 each Topical Daily   dexamethasone (DECADRON) injection  6 mg Intravenous Q24H   enoxaparin (LOVENOX) injection  80 mg  Subcutaneous Q12H   fentaNYL  1 patch Transdermal Q72H   fluorouracil  750 mg/m2/day (Treatment Plan Recorded) Intravenous 4 days   gabapentin  300 mg Oral TID   insulin aspart  0-20 Units Subcutaneous TID WC   insulin aspart  0-5 Units Subcutaneous QHS   insulin aspart  5 Units Subcutaneous TID WC   insulin glargine-yfgn  25 Units Subcutaneous Daily   lidocaine  1 Application Topical Once   metoprolol tartrate  25 mg Oral BID   nicotine  21 mg Transdermal Daily   oxybutynin  5 mg Oral QHS   polyethylene glycol  17 g Oral BID   Ensure Max Protein  11 oz Oral TID BM   QUEtiapine  800 mg Oral QHS   rosuvastatin  40 mg Oral Daily   senna  1 tablet Oral BID   Continuous Infusions:  sodium chloride 10 mL/hr at 10/07/23 1153   linezolid (ZYVOX) IV 600 mg (10/08/23 0440)   [  START ON 10/10/2023] ondansetron (ZOFRAN) IV       LOS: 3 days    Time spent: 35 minutes   Omayra Tulloch A Chinara Hertzberg, MD [facility]   If 7PM-7AM, please contact night-coverage [email or web address]  10/08/2023, 11:46 AM

## 2023-10-08 NOTE — Progress Notes (Addendum)
[name] Rn  managed heparin drip  and if needs to be adjusted will manage chemo drip see chart  for care completed byCharge Rn

## 2023-10-09 DIAGNOSIS — R220 Localized swelling, mass and lump, head: Secondary | ICD-10-CM | POA: Diagnosis not present

## 2023-10-09 LAB — GLUCOSE, CAPILLARY
Glucose-Capillary: 271 mg/dL — ABNORMAL HIGH (ref 70–99)
Glucose-Capillary: 369 mg/dL — ABNORMAL HIGH (ref 70–99)
Glucose-Capillary: 137 mg/dL — ABNORMAL HIGH (ref 70–99)
Glucose-Capillary: 185 mg/dL — ABNORMAL HIGH (ref 70–99)

## 2023-10-09 LAB — CBC WITH DIFFERENTIAL/PLATELET
Abs Immature Granulocytes: 0.04 10*3/uL (ref 0.00–0.07)
Basophils Absolute: 0 10*3/uL (ref 0.0–0.1)
Basophils Relative: 0 %
Eosinophils Absolute: 0 10*3/uL (ref 0.0–0.5)
Eosinophils Relative: 0 %
HCT: 34.4 % — ABNORMAL LOW (ref 36.0–46.0)
Hemoglobin: 11.6 g/dL — ABNORMAL LOW (ref 12.0–15.0)
Immature Granulocytes: 1 %
Lymphocytes Relative: 12 %
Lymphs Abs: 0.9 10*3/uL (ref 0.7–4.0)
MCH: 31.1 pg (ref 26.0–34.0)
MCHC: 33.7 g/dL (ref 30.0–36.0)
MCV: 92.2 fL (ref 80.0–100.0)
Monocytes Absolute: 0.5 10*3/uL (ref 0.1–1.0)
Monocytes Relative: 6 %
Neutro Abs: 6.4 10*3/uL (ref 1.7–7.7)
Neutrophils Relative %: 81 %
Platelets: 302 10*3/uL (ref 150–400)
RBC: 3.73 MIL/uL — ABNORMAL LOW (ref 3.87–5.11)
RDW: 14.3 % (ref 11.5–15.5)
WBC: 7.8 10*3/uL (ref 4.0–10.5)
nRBC: 0 % (ref 0.0–0.2)

## 2023-10-09 LAB — COMPREHENSIVE METABOLIC PANEL
ALT: 24 U/L (ref 0–44)
AST: 37 U/L (ref 15–41)
Albumin: 2.9 g/dL — ABNORMAL LOW (ref 3.5–5.0)
Alkaline Phosphatase: 57 U/L (ref 38–126)
Anion gap: 10 (ref 5–15)
BUN: 22 mg/dL — ABNORMAL HIGH (ref 6–20)
CO2: 23 mmol/L (ref 22–32)
Calcium: 8.9 mg/dL (ref 8.9–10.3)
Chloride: 95 mmol/L — ABNORMAL LOW (ref 98–111)
Creatinine, Ser: 0.69 mg/dL (ref 0.44–1.00)
GFR, Estimated: >60 mL/min (ref >60)
Glucose, Bld: 287 mg/dL — ABNORMAL HIGH (ref 70–99)
Potassium: 3.9 mmol/L (ref 3.5–5.1)
Sodium: 128 mmol/L — ABNORMAL LOW (ref 135–145)
Total Bilirubin: 0.5 mg/dL (ref <1.2)
Total Protein: 5.9 g/dL — ABNORMAL LOW (ref 6.5–8.1)

## 2023-10-09 MED ORDER — INSULIN GLARGINE-YFGN 100 UNIT/ML ~~LOC~~ SOLN
5.0000 [IU] | Freq: Once | SUBCUTANEOUS | Status: AC
  Administered 2023-10-09: 5 [IU] via SUBCUTANEOUS
  Filled 2023-10-09: qty 0.05

## 2023-10-09 MED ORDER — INSULIN GLARGINE-YFGN 100 UNIT/ML ~~LOC~~ SOLN
30.0000 [IU] | Freq: Every day | SUBCUTANEOUS | Status: DC
  Administered 2023-10-10 – 2023-10-11 (×2): 30 [IU] via SUBCUTANEOUS
  Filled 2023-10-09 (×2): qty 0.3

## 2023-10-09 MED ORDER — BISACODYL 10 MG RE SUPP
10.0000 mg | Freq: Once | RECTAL | Status: AC
  Administered 2023-10-09: 10 mg via RECTAL
  Filled 2023-10-09: qty 1

## 2023-10-09 MED ORDER — INSULIN ASPART 100 UNIT/ML IJ SOLN
7.0000 [IU] | Freq: Three times a day (TID) | INTRAMUSCULAR | Status: DC
  Administered 2023-10-09: 7 [IU] via SUBCUTANEOUS

## 2023-10-09 MED ORDER — LINACLOTIDE 145 MCG PO CAPS
290.0000 ug | ORAL_CAPSULE | Freq: Every day | ORAL | Status: DC
Start: 2023-10-10 — End: 2023-10-13
  Administered 2023-10-10 – 2023-10-12 (×3): 290 ug via ORAL
  Filled 2023-10-09 (×5): qty 2

## 2023-10-09 MED ORDER — INSULIN ASPART 100 UNIT/ML IJ SOLN
5.0000 [IU] | Freq: Three times a day (TID) | INTRAMUSCULAR | Status: DC
  Administered 2023-10-09 – 2023-10-12 (×9): 5 [IU] via SUBCUTANEOUS

## 2023-10-09 MED ORDER — LINEZOLID 600 MG PO TABS
600.0000 mg | ORAL_TABLET | Freq: Two times a day (BID) | ORAL | Status: DC
  Administered 2023-10-09 – 2023-10-12 (×8): 600 mg via ORAL
  Filled 2023-10-09 (×9): qty 1

## 2023-10-09 MED ORDER — GLUCERNA SHAKE PO LIQD
237.0000 mL | Freq: Three times a day (TID) | ORAL | Status: DC
  Administered 2023-10-09 – 2023-10-12 (×8): 237 mL via ORAL
  Filled 2023-10-09 (×10): qty 237

## 2023-10-09 NOTE — Progress Notes (Signed)
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 Will follow Dr Myna Hidalgo recommendation in regards discontinuation of decadron.  -Radiation oncology consulted, patient underwent  CT simulation.  They anticipate 5 weeks of radiation to the left neck -Covering for cellulitis periorbital as well with linezolid.  -Started radiation 12/13.   2-7 mm dural based enhancing lesion along the left medial frontal gyrus, favored to reflect a small meningioma; -Follow Dr. Myna Hidalgo recommendation   3-Diabetes type 2 with hyperglycemia uncontrolled, in setting of steroids.  Suspect will get worsening hyperglycemia in the setting of IV Decadron Semglee increase to 30 units. Increase meal coverage to 7 units. SSI>  Reduce steroids.   Recurrent left ductal carcinoma of breast cancer;  -Followed by Dr. Myna Hidalgo now with concern of metastatic disease to the neck -Dr. Myna Hidalgo consulted -Patient will need Port-A-Cath and plan is to start chemotherapy -CT abdomen and pelvis obtained: No evidence of distant metastatic disease in the chest, abdomen or pelvis.  Hyperlipidemia:   -Continue fenobrite.   -Continue with Crestor as advised by her cardiology    Hypertension: Continue with metoprolol Pseudohyponatremia.  Constipation; Started  bowel regimen. Will order suppository   Obesity type 1; needs weight loss Estimated body mass index is 34.07 kg/m as calculated from the following:   Height as of this encounter: 5\' 2"  (1.575 m).   Weight as of this encounter: 84.5 kg.   DVT prophylaxis: Heparin drip Code Status: Full code Family Communication: Care discussed with patient Disposition Plan:  Status is: Inpatient Remains inpatient appropriate because: Management of IJ thrombosis and cervical metastasis disease    Consultants:  Dr. Myna Hidalgo Dr. Roselind Messier radiation oncology IR  Procedures:    Antimicrobials:    Subjective: Report pain is better, there is only one spot on her neck really bothering her.  No BM yet  She is in good spirit, is her Birthday  Objective: Vitals:   10/08/23 2009 10/09/23 0520 10/09/23 1055 10/09/23 1417  BP: 139/85 138/85 (!) 173/83 (!) 145/81  Pulse: 84 83 79 78  Resp: 18 18  14   Temp: 98.5 F (36.9 C) 98 F (36.7 C)  98 F (36.7 C)  TempSrc: Oral Oral  Oral  SpO2: 95% 94%  95%  Weight:      Height:        Intake/Output Summary (Last 24 hours) at 10/09/2023 1544 Last data filed at 10/09/2023 0900 Gross per 24 hour  Intake 1369.04 ml  Output --  Net 1369.04 ml   Filed Weights   10/05/23 0938 10/05/23 1640  Weight: 87.1 kg 84.5 kg    Examination:  General exam: NAD, left periorbita edema less pronounced.  Respiratory system: CTA Cardiovascular system: s 1 S  2 RRR Gastrointestinal system:  BS present, soft, nt Central nervous system: Alert, follows command Extremities: no edema   Data Reviewed: I have personally reviewed following labs and imaging studies  CBC: Recent Labs  Lab 10/05/23 1026 10/06/23 0035 10/07/23 0611 10/08/23 0748 10/09/23 0639  WBC 5.6 9.4 9.0 11.4* 7.8  NEUTROABS 4.0   --   --   --  6.4  HGB 11.5* 11.6* 11.3* 12.1 11.6*  HCT 34.4* 34.2* 34.7* 36.4 34.4*  MCV 93.5 91.9 92.0 94.3 92.2  PLT 301 294 365 337 302   Basic Metabolic Panel: Recent Labs  Lab 10/05/23 1026 10/06/23 0035 10/07/23 0611 10/08/23 0748 10/08/23 0857 10/09/23 0639  NA 135 133* 135 132*  --  128*  K 4.2 4.1 3.5 3.8  --  3.9  CL 103 101 97* 100  --  95*  CO2 23 22 26 24   --  23  GLUCOSE 286* 240* 270* 422* 449* 287*  BUN 15 11 16 18   --  22*  CREATININE 0.77 0.57 0.69 0.70  --  0.69  CALCIUM 8.6* 8.6* 9.3 8.9  --  8.9   GFR: Estimated Creatinine Clearance: 80.2 mL/min (by C-G formula based on SCr of 0.69 mg/dL). Liver Function Tests: Recent Labs  Lab 10/05/23 1026 10/06/23 0035 10/07/23 0611 10/08/23 0748 10/09/23 0639  AST 15 12* 13* 13* 37  ALT 13 13 13 15 24   ALKPHOS 89 87 78 73 57  BILITOT 0.4 0.5 0.4 0.4 0.5  PROT 6.8 6.4* 6.9 6.3* 5.9*  ALBUMIN 3.4* 3.3* 3.5 3.3* 2.9*   No results for input(s): "LIPASE", "AMYLASE" in the last 168 hours. No results for input(s): "AMMONIA" in the last 168 hours. Coagulation Profile: Recent Labs  Lab 10/05/23 1122  INR 1.1   Cardiac Enzymes: No results for input(s): "CKTOTAL", "CKMB", "CKMBINDEX", "TROPONINI" in the last 168 hours. BNP (last 3 results) No results for input(s): "PROBNP" in the last 8760 hours. HbA1C: No results for input(s): "HGBA1C" in the last 72 hours. CBG: Recent Labs  Lab 10/08/23 1341 10/08/23 1628 10/08/23 2135 10/09/23 0721 10/09/23 1124  GLUCAP 288* 228* 202* 271* 369*   Lipid Profile: No results for input(s): "CHOL", "HDL", "LDLCALC", "TRIG", "CHOLHDL", "LDLDIRECT" in the last 72 hours. Thyroid Function Tests: No results for input(s): "TSH", "T4TOTAL", "FREET4", "T3FREE", "THYROIDAB" in the last 72 hours. Anemia Panel: No results for input(s): "VITAMINB12", "FOLATE", "FERRITIN", "TIBC", "IRON", "RETICCTPCT" in the last 72 hours. Sepsis Labs: No results for input(s): "PROCALCITON",  "LATICACIDVEN" in the last 168 hours.  No results found for this or any previous visit (from the past 240 hours).       Radiology Studies: No results found.       Scheduled Meds:  Chlorhexidine Gluconate Cloth  6 each Topical Daily   dexamethasone (DECADRON) injection  6 mg Intravenous Q24H   enoxaparin (LOVENOX) injection  80 mg Subcutaneous Q12H   feeding supplement (GLUCERNA SHAKE)  237 mL Oral TID BM   fentaNYL  1 patch Transdermal Q72H   fluorouracil  750 mg/m2/day (Treatment Plan Recorded) Intravenous 4 days   gabapentin  300 mg Oral TID   insulin aspart  0-20 Units Subcutaneous TID WC   insulin aspart  0-5 Units Subcutaneous QHS   insulin aspart  7 Units Subcutaneous TID WC   [START ON 10/10/2023] insulin glargine-yfgn  30 Units Subcutaneous Daily   lidocaine  1 Application Topical Once   [START ON 10/10/2023] linaclotide  290 mcg Oral QAC breakfast  linezolid  600 mg Oral Q12H   metoprolol tartrate  25 mg Oral BID   nicotine  21 mg Transdermal Daily   oxybutynin  5 mg Oral QHS   polyethylene glycol  17 g Oral BID   QUEtiapine  800 mg Oral QHS   rosuvastatin  40 mg Oral Daily   senna  1 tablet Oral BID   Continuous Infusions:  sodium chloride 10 mL/hr at 10/07/23 1153   [START ON 10/10/2023] ondansetron (ZOFRAN) IV       LOS: 4 days    Time spent: 35 minutes   Brayen Bunn A Chevelle Coulson, MD [facility]   If 7PM-7AM, please contact night-coverage [email or web address]  10/09/2023, 3:44 PM

## 2023-10-09 NOTE — Plan of Care (Signed)
  Problem: Education: Goal: Ability to describe self-care measures that may prevent or decrease complications (Diabetes Survival Skills Education) will improve Outcome: Progressing   Problem: Coping: Goal: Ability to adjust to condition or change in health will improve Outcome: Progressing   Problem: Fluid Volume: Goal: Ability to maintain a balanced intake and output will improve Outcome: Progressing   Problem: Health Behavior/Discharge Planning: Goal: Ability to identify and utilize available resources and services will improve Outcome: Progressing Goal: Ability to manage health-related needs will improve Outcome: Progressing   Problem: Metabolic: Goal: Ability to maintain appropriate glucose levels will improve Outcome: Progressing   Problem: Nutritional: Goal: Maintenance of adequate nutrition will improve Outcome: Progressing Goal: Progress toward achieving an optimal weight will improve Outcome: Progressing   Problem: Skin Integrity: Goal: Risk for impaired skin integrity will decrease Outcome: Progressing   Problem: Tissue Perfusion: Goal: Adequacy of tissue perfusion will improve Outcome: Progressing   Problem: Education: Goal: Knowledge of General Education information will improve Description: Including pain rating scale, medication(s)/side effects and non-pharmacologic comfort measures Outcome: Progressing   Problem: Health Behavior/Discharge Planning: Goal: Ability to manage health-related needs will improve Outcome: Progressing   Problem: Clinical Measurements: Goal: Ability to maintain clinical measurements within normal limits will improve Outcome: Progressing Goal: Will remain free from infection Outcome: Progressing Goal: Diagnostic test results will improve Outcome: Progressing Goal: Respiratory complications will improve Outcome: Progressing Goal: Cardiovascular complication will be avoided Outcome: Progressing   Problem: Activity: Goal:  Risk for activity intolerance will decrease Outcome: Progressing   Problem: Nutrition: Goal: Adequate nutrition will be maintained Outcome: Progressing   Problem: Coping: Goal: Level of anxiety will decrease Outcome: Progressing   Problem: Elimination: Goal: Will not experience complications related to bowel motility Outcome: Progressing   Problem: Pain Management: Goal: General experience of comfort will improve Outcome: Progressing   Problem: Safety: Goal: Ability to remain free from injury will improve Outcome: Progressing   Problem: Skin Integrity: Goal: Risk for impaired skin integrity will decrease Outcome: Progressing

## 2023-10-10 ENCOUNTER — Other Ambulatory Visit: Payer: Self-pay

## 2023-10-10 ENCOUNTER — Ambulatory Visit
Admit: 2023-10-10 | Discharge: 2023-10-10 | Disposition: A | Discharge status: Home / Self Care | Payer: Medicaid Other | Attending: Radiation Oncology | Admitting: Radiation Oncology

## 2023-10-10 DIAGNOSIS — Z794 Long term (current) use of insulin: Secondary | ICD-10-CM

## 2023-10-10 DIAGNOSIS — R220 Localized swelling, mass and lump, head: Secondary | ICD-10-CM | POA: Diagnosis not present

## 2023-10-10 DIAGNOSIS — E669 Obesity, unspecified: Secondary | ICD-10-CM | POA: Diagnosis not present

## 2023-10-10 DIAGNOSIS — E1165 Type 2 diabetes mellitus with hyperglycemia: Secondary | ICD-10-CM | POA: Diagnosis not present

## 2023-10-10 DIAGNOSIS — I82C19 Acute embolism and thrombosis of unspecified internal jugular vein: Secondary | ICD-10-CM | POA: Diagnosis not present

## 2023-10-10 LAB — CBC
HCT: 32.1 % — ABNORMAL LOW (ref 36.0–46.0)
Hemoglobin: 11.2 g/dL — ABNORMAL LOW (ref 12.0–15.0)
MCH: 31.4 pg (ref 26.0–34.0)
MCHC: 34.9 g/dL (ref 30.0–36.0)
MCV: 89.9 fL (ref 80.0–100.0)
Platelets: 288 10*3/uL (ref 150–400)
RBC: 3.57 MIL/uL — ABNORMAL LOW (ref 3.87–5.11)
RDW: 14.1 % (ref 11.5–15.5)
WBC: 8.2 10*3/uL (ref 4.0–10.5)
nRBC: 0 % (ref 0.0–0.2)

## 2023-10-10 LAB — GLUCOSE, CAPILLARY
Glucose-Capillary: 425 mg/dL — ABNORMAL HIGH (ref 70–99)
Glucose-Capillary: 323 mg/dL — ABNORMAL HIGH (ref 70–99)
Glucose-Capillary: 315 mg/dL — ABNORMAL HIGH (ref 70–99)
Glucose-Capillary: 128 mg/dL — ABNORMAL HIGH (ref 70–99)
Glucose-Capillary: 346 mg/dL — ABNORMAL HIGH (ref 70–99)

## 2023-10-10 LAB — RAD ONC ARIA SESSION SUMMARY
Course Elapsed Days: 3
Plan Fractions Treated to Date: 2
Plan Prescribed Dose Per Fraction: 2 Gy
Plan Total Fractions Prescribed: 25
Plan Total Prescribed Dose: 50 Gy
Reference Point Dosage Given to Date: 4 Gy
Reference Point Session Dosage Given: 2 Gy
Session Number: 2

## 2023-10-10 MED ORDER — ALUM & MAG HYDROXIDE-SIMETH 200-200-20 MG/5ML PO SUSP
30.0000 mL | ORAL | Status: DC | PRN
  Administered 2023-10-10: 30 mL via ORAL
  Filled 2023-10-10: qty 30

## 2023-10-10 MED ORDER — PANTOPRAZOLE SODIUM 40 MG PO TBEC
40.0000 mg | DELAYED_RELEASE_TABLET | Freq: Every day | ORAL | Status: DC
Start: 2023-10-10 — End: 2023-10-13
  Administered 2023-10-10 – 2023-10-12 (×3): 40 mg via ORAL
  Filled 2023-10-10 (×3): qty 1

## 2023-10-10 MED ORDER — INSULIN ASPART 100 UNIT/ML IJ SOLN
5.0000 [IU] | Freq: Once | INTRAMUSCULAR | Status: AC
  Administered 2023-10-10: 5 [IU] via SUBCUTANEOUS

## 2023-10-10 MED ORDER — ONDANSETRON 8 MG/NS 50 ML IVPB
8.0000 mg | Freq: Three times a day (TID) | INTRAVENOUS | Status: DC
  Administered 2023-10-10 – 2023-10-12 (×6): 8 mg via INTRAVENOUS
  Filled 2023-10-10 (×2): qty 54
  Filled 2023-10-10 (×5): qty 8

## 2023-10-10 NOTE — Progress Notes (Signed)
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 Will follow Dr Myna Hidalgo recommendation in regards discontinuation of decadron.  -Radiation oncology consulted, patient underwent  CT simulation.  They anticipate 5 weeks of radiation to the left neck -Covering for cellulitis periorbital as well with linezolid.  -Started radiation 12/13.     2-7 mm dural based enhancing lesion along the left medial frontal gyrus, favored to reflect a small meningioma; -Follow Dr. Myna Hidalgo recommendation   Diabetes type 2 with hyperglycemia uncontrolled, in setting of steroids.  Suspect will get worsening hyperglycemia in the setting of IV Decadron Continue Semglee 30 units subcu daily  -Continue meal coverage with NovoLog 5 units 3 times daily     Recurrent left ductal carcinoma of breast cancer;  -Followed by Dr. Myna Hidalgo now with concern of metastatic disease to the neck -Dr. Myna Hidalgo consulted -Currently getting chemotherapy -CT abdomen and pelvis obtained: No evidence of distant metastatic disease in the chest, abdomen or pelvis.   Hyperlipidemia:  -Continue fenobrite.   -Continue with Crestor as advised by her cardiology    Hypertension: Continue with metoprolol  Constipation; Started  bowel regimen.    Medications     Chlorhexidine Gluconate Cloth  6 each Topical Daily   dexamethasone (DECADRON)  injection  6 mg Intravenous Q24H   enoxaparin (LOVENOX) injection  80 mg Subcutaneous Q12H   feeding supplement (GLUCERNA SHAKE)  237 mL Oral TID BM   fentaNYL  1 patch Transdermal Q72H   fluorouracil  750 mg/m2/day (Treatment Plan Recorded) Intravenous 4 days   gabapentin  300 mg Oral TID   insulin aspart  0-20 Units Subcutaneous TID WC   insulin aspart  0-5 Units Subcutaneous QHS   insulin aspart  5 Units Subcutaneous TID WC   insulin glargine-yfgn  30 Units Subcutaneous Daily   linaclotide  290 mcg Oral QAC breakfast   linezolid  600 mg Oral Q12H   metoprolol tartrate  25 mg Oral BID   nicotine  21 mg Transdermal Daily   oxybutynin  5 mg Oral QHS   pantoprazole  40 mg Oral Daily   polyethylene glycol  17 g Oral BID   QUEtiapine  800 mg Oral QHS   rosuvastatin  40 mg Oral Daily   senna  1 tablet Oral BID     Data Reviewed:   CBG:  Recent Labs  Lab 10/09/23 1627 10/09/23 2128 10/10/23 0752 10/10/23 1127 10/10/23 1555  GLUCAP 137* 185* 323* 315* 128*    SpO2: 90 %    Vitals:   10/09/23 1948 10/10/23 0557 10/10/23 1116 10/10/23 1130  BP: 137/80 (!) 151/95 (!) 143/91 127/70  Pulse: 85 74 72 81  Resp: 18 18    Temp: 98.8 F (37.1 C) (!) 97.5 F (36.4 C)  97.7 F (36.5 C)  TempSrc: Oral Oral  Oral  SpO2: 93% 96%  90%  Weight:      Height:  Data Reviewed:  Basic Metabolic Panel: Recent Labs  Lab 10/05/23 1026 10/06/23 0035 10/07/23 0611 10/08/23 0748 10/08/23 0857 10/09/23 0639  NA 135 133* 135 132*  --  128*  K 4.2 4.1 3.5 3.8  --  3.9  CL 103 101 97* 100  --  95*  CO2 23 22 26 24   --  23  GLUCOSE 286* 240* 270* 422* 449* 287*  BUN 15 11 16 18   --  22*  CREATININE 0.77 0.57 0.69 0.70  --  0.69  CALCIUM 8.6* 8.6* 9.3 8.9  --  8.9    CBC: Recent Labs  Lab 10/05/23 1026 10/06/23 0035 10/07/23 0611 10/08/23 0748 10/09/23 0639 10/10/23 0545  WBC 5.6 9.4 9.0 11.4* 7.8 8.2  NEUTROABS 4.0  --   --   --  6.4  --   HGB 11.5* 11.6*  11.3* 12.1 11.6* 11.2*  HCT 34.4* 34.2* 34.7* 36.4 34.4* 32.1*  MCV 93.5 91.9 92.0 94.3 92.2 89.9  PLT 301 294 365 337 302 288    LFT Recent Labs  Lab 10/05/23 1026 10/06/23 0035 10/07/23 0611 10/08/23 0748 10/09/23 0639  AST 15 12* 13* 13* 37  ALT 13 13 13 15 24   ALKPHOS 89 87 78 73 57  BILITOT 0.4 0.5 0.4 0.4 0.5  PROT 6.8 6.4* 6.9 6.3* 5.9*  ALBUMIN 3.4* 3.3* 3.5 3.3* 2.9*     Antibiotics: Anti-infectives (From admission, onward)    Start     Dose/Rate Route Frequency Ordered Stop   10/09/23 1000  linezolid (ZYVOX) tablet 600 mg        600 mg Oral Every 12 hours 10/09/23 0750 10/15/23 0959   10/08/23 0530  linezolid (ZYVOX) IVPB 600 mg  Status:  Discontinued        600 mg 300 mL/hr over 60 Minutes Intravenous Every 12 hours 10/07/23 1918 10/09/23 0750   10/07/23 1415  linezolid (ZYVOX) IVPB 600 mg  Status:  Discontinued        600 mg 300 mL/hr over 60 Minutes Intravenous Every 12 hours 10/07/23 1316 10/07/23 1918   10/06/23 1500  clindamycin (CLEOCIN) IVPB 600 mg  Status:  Discontinued        600 mg 100 mL/hr over 30 Minutes Intravenous Every 8 hours 10/06/23 1153 10/07/23 1316        DVT prophylaxis: Lovenox  Code Status: Full code  Family Communication: No family at bedside   CONSULTS    Subjective   Denies shortness of breath   Objective    Physical Examination:   General-appears in no acute distress Heart-S1-S2, regular, no murmur auscultated Lungs-clear to auscultation bilaterally, no wheezing or crackles auscultated Abdomen-soft, nontender, no organomegaly Extremities-no edema in the lower extremities Neuro-alert, oriented x3, no focal deficit noted  Status is: Inpatient:             [name] S Dalya Maselli   [facility] If 7PM-7AM, please contact night-coverage at [email or web address], Office  [phone]   10/10/2023, 5:36 PM  LOS: 5 days

## 2023-10-10 NOTE — Progress Notes (Signed)
So far, the chemotherapy and radiation therapy are going pretty well.  She is on the 5-FU drip right now.  I think she probably finishes up tomorrow.  She will have her radiotherapy.  She feels like there is less swelling in the left side of her neck.  There is less pain in the left side of her neck.  I am sure that the Duragesic patch is probably helping.  She is eating okay.  She is having no problems with dysphagia or odynophagia.  She is out of bed.  She had a birthday on Sunday.  I think she had a nice birthday.  She has had no fever.  There has been no bleeding.  Her labs show white count of 8.2.  Hemoglobin 11.2.  Platelet count 288,000.  Again, her blood sugars have been on the higher side.  Sure this is reflective of the steroids.  Her steroid dose was decreased over the weekend.  She now is on Lovenox for anticoagulation.  She is doing well with the Lovenox.  She still has had problems with constipation.  This been a long-term issue for her.  She has had a little bit of nausea but I will hear any vomiting.  Her vital signs show a temperature of 97.5.  Pulse 74.  Blood pressure 151/95.  Her head neck exam still shows some swelling and firmness in the face and neck.  The firmness is mostly on the left side.  Lungs are clear bilaterally.  Cardiac exam regular rate and rhythm.  Abdomen is soft.  Bowel sounds are present.  There is no fluid wave.  There is no palpable liver or spleen tip.  Extremity shows no clubbing, cyanosis or edema.  Neurological exam shows no focal neurological deficits.   We will continue her on the chemo radiation therapy for the recurrent breast cancer in her neck.  I still think is too early to really know the long-term outcome.  Hopefully, because this is just in the lymph nodes, she we will have a decent response and nice shrinkage.  I do appreciate the great care that she is getting from everybody up on 6 E.   Christin Bach, MD  Jeri Modena 23:5

## 2023-10-10 NOTE — Inpatient Diabetes Management (Signed)
Inpatient Diabetes Program Recommendations  AACE/ADA: New Consensus Statement on Inpatient Glycemic Control (2015)  Target Ranges:  Prepandial:   less than 140 mg/dL      Peak postprandial:   less than 180 mg/dL (1-2 hours)      Critically ill patients:  140 - 180 mg/dL   Lab Results  Component Value Date   GLUCAP 323 (H) 10/10/2023   HGBA1C 9.3 05/02/2023    Review of Glycemic Control  Latest Reference Range & Units 10/09/23 07:21 10/09/23 11:24 10/09/23 16:27 10/09/23 21:28 10/10/23 07:52  Glucose-Capillary 70 - 99 mg/dL 010 (H)  Novolog 16 units 369 (H)  Novolog 27 units 137 (H)  Novolog 8 units 185 (H) 323 (H)  Novolog 20 units   Diabetes history: DM 2 Outpatient Diabetes medications: Novolog 4-14 units tid, Metformin 1000 mg bid, Tresiba 26 units qhs Current orders for Inpatient glycemic control:  Semglee 30 units Daily Novolog 0-20 units tid + hs  Glucerna tid between meals Decadron 6 mg Q24 hours  Inpatient Diabetes Program Recommendations:    -   Increase Novolog meal coverage to 8 units tid if eating >50% of meals  Thanks,  Christena Deem RN, MSN, BC-ADM Inpatient Diabetes Coordinator Team Pager [phone] (8a-5p)

## 2023-10-11 ENCOUNTER — Other Ambulatory Visit: Payer: Self-pay

## 2023-10-11 ENCOUNTER — Ambulatory Visit
Admit: 2023-10-11 | Discharge: 2023-10-11 | Disposition: A | Discharge status: Home / Self Care | Payer: Medicaid Other | Attending: Radiation Oncology | Admitting: Radiation Oncology

## 2023-10-11 ENCOUNTER — Ambulatory Visit
Admission: RE | Admit: 2023-10-11 | Discharge: 2023-10-11 | Disposition: A | Discharge status: Home / Self Care | Payer: Medicaid Other | Source: Ambulatory Visit | Attending: Radiation Oncology | Admitting: Radiation Oncology

## 2023-10-11 DIAGNOSIS — C770 Secondary and unspecified malignant neoplasm of lymph nodes of head, face and neck: Secondary | ICD-10-CM

## 2023-10-11 DIAGNOSIS — R220 Localized swelling, mass and lump, head: Secondary | ICD-10-CM | POA: Diagnosis not present

## 2023-10-11 LAB — RAD ONC ARIA SESSION SUMMARY
Course Elapsed Days: 4
Plan Fractions Treated to Date: 3
Plan Prescribed Dose Per Fraction: 2 Gy
Plan Total Fractions Prescribed: 25
Plan Total Prescribed Dose: 50 Gy
Reference Point Dosage Given to Date: 6 Gy
Reference Point Session Dosage Given: 2 Gy
Session Number: 3

## 2023-10-11 LAB — CBC
HCT: 32.6 % — ABNORMAL LOW (ref 36.0–46.0)
Hemoglobin: 11 g/dL — ABNORMAL LOW (ref 12.0–15.0)
MCH: 30.9 pg (ref 26.0–34.0)
MCHC: 33.7 g/dL (ref 30.0–36.0)
MCV: 91.6 fL (ref 80.0–100.0)
Platelets: 287 10*3/uL (ref 150–400)
RBC: 3.56 MIL/uL — ABNORMAL LOW (ref 3.87–5.11)
RDW: 14.2 % (ref 11.5–15.5)
WBC: 5.7 10*3/uL (ref 4.0–10.5)
nRBC: 0 % (ref 0.0–0.2)

## 2023-10-11 LAB — GLUCOSE, CAPILLARY
Glucose-Capillary: 315 mg/dL — ABNORMAL HIGH (ref 70–99)
Glucose-Capillary: 328 mg/dL — ABNORMAL HIGH (ref 70–99)
Glucose-Capillary: 183 mg/dL — ABNORMAL HIGH (ref 70–99)
Glucose-Capillary: 336 mg/dL — ABNORMAL HIGH (ref 70–99)

## 2023-10-11 MED ORDER — SONAFINE EX EMUL
1.0000 | Freq: Once | CUTANEOUS | Status: AC
Start: 2023-10-11 — End: 2023-10-11
  Administered 2023-10-11: 1 via TOPICAL

## 2023-10-11 MED ORDER — INSULIN GLARGINE-YFGN 100 UNIT/ML ~~LOC~~ SOLN
10.0000 [IU] | Freq: Once | SUBCUTANEOUS | Status: AC
  Administered 2023-10-11: 10 [IU] via SUBCUTANEOUS
  Filled 2023-10-11: qty 0.1

## 2023-10-11 MED ORDER — INSULIN GLARGINE-YFGN 100 UNIT/ML ~~LOC~~ SOLN
40.0000 [IU] | Freq: Every day | SUBCUTANEOUS | Status: DC
Start: 2023-10-12 — End: 2023-10-13
  Administered 2023-10-12: 40 [IU] via SUBCUTANEOUS
  Filled 2023-10-11: qty 0.4

## 2023-10-11 MED ORDER — OLANZAPINE 5 MG PO TABS
10.0000 mg | ORAL_TABLET | Freq: Every day | ORAL | Status: DC
  Administered 2023-10-11: 10 mg via ORAL
  Filled 2023-10-11: qty 2

## 2023-10-11 NOTE — Progress Notes (Signed)
This should be the last day of infusional 5-fluorouracil.  She is doing well with this.  She has a little bit of nausea.  I will try her on some olanzapine.  She had radiation yesterday..  She still has the swelling and firmness on the left side of the neck.  She still has some facial swelling but it may not be as prominent.  She has had no mouth sores.  There is been no diarrhea.  She has had no rashes.  Her blood sugars have been on the high side.  I think that the diabetic nurse has been helping out.  Her labs today show white cell count 5.7.  Hemoglobin 11.  Platelet count 287,000.  She is out of bed.  She is doing well with the Lovenox injections.   Vital signs are temperature 98.2.  Pulse 84.  Blood pressure 114/80.  Her head and exam still shows the facial swelling.  It may not be as prominent.  She still has a lot of firmness on the left side of the neck.  There is no intraoral lesions.  Lungs are clear bilaterally.  Cardiac exam regular rate and rhythm.  Abdomen is soft.  Bowel sounds are present.  There is no fluid wave.  Extremities shows no clubbing, cyanosis or edema.   Again, Ms. Perlman has a recurrent breast cancer.  The recurrence is mostly in her neck.  This is quite unusual.  We are given her chemo and radiation therapy.  She should be able to finish of the chemotherapy today.  I would think that she should be able to go home tomorrow.  She will be followed daily at radiation therapy.  I know that she has had incredible care from everybody up on 6 E.   Christin Bach, MD  Duwayne Heck 7:14

## 2023-10-11 NOTE — Progress Notes (Addendum)
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 Will follow Dr Myna Hidalgo recommendation in regards discontinuation of decadron.  -Radiation oncology consulted, patient underwent  CT simulation.  They anticipate 5 weeks of radiation to the left neck -Covering for cellulitis periorbital as well with linezolid.  -Started radiation 12/13.     2-7 mm dural based enhancing lesion along the left medial frontal gyrus, favored to reflect a small meningioma; -Follow Dr. Myna Hidalgo recommendation   Diabetes type 2 with hyperglycemia uncontrolled, in setting of steroids.  Suspect will get worsening hyperglycemia in the setting of IV Decadron -Blood glucose has been elevated, will increase Semglee to 40 units subcu daily -Continue meal coverage with NovoLog 5 units 3 times daily  -Continue sliding scale insulin with NovoLog  Pseudohyponatremia -Sodium 128, likely in setting of hyperglycemia -Follow BMP in am    Recurrent left ductal carcinoma of breast cancer;  -Followed by Dr. Myna Hidalgo now with concern of metastatic disease to the neck -Dr. Myna Hidalgo consulted -Currently getting chemotherapy -CT abdomen and pelvis obtained: No evidence of distant metastatic disease in the chest, abdomen or pelvis.   Hyperlipidemia:  -Continue fenobrite.   -Continue with Crestor as advised by her cardiology    Hypertension:  Continue with metoprolol  Constipation; Started  bowel regimen.    Medications     Chlorhexidine Gluconate Cloth  6 each Topical Daily   dexamethasone (DECADRON) injection  6 mg Intravenous Q24H   enoxaparin (LOVENOX) injection  80 mg Subcutaneous Q12H   feeding supplement (GLUCERNA SHAKE)  237 mL Oral TID BM   fentaNYL  1 patch Transdermal Q72H   fluorouracil  750 mg/m2/day (Treatment Plan Recorded) Intravenous 4 days   gabapentin  300 mg Oral TID   insulin aspart  0-20 Units Subcutaneous TID WC   insulin aspart  0-5 Units Subcutaneous QHS   insulin aspart  5 Units Subcutaneous TID WC   insulin glargine-yfgn  30 Units Subcutaneous Daily   linaclotide  290 mcg Oral QAC breakfast   linezolid  600 mg Oral Q12H   metoprolol tartrate  25 mg Oral BID   nicotine  21 mg Transdermal Daily   OLANZapine  10 mg Oral QHS   oxybutynin  5 mg Oral QHS   pantoprazole  40 mg Oral Daily   polyethylene glycol  17 g Oral BID   QUEtiapine  800 mg Oral QHS   rosuvastatin  40 mg Oral Daily   senna  1 tablet Oral BID     Data Reviewed:   CBG:  Recent Labs  Lab 10/10/23 1127 10/10/23 1555 10/10/23 2124 10/11/23 0747 10/11/23 1134  GLUCAP 315* 128* 346* 315* 328*    SpO2: 97 %    Vitals:   10/10/23 1130 10/10/23 2124 10/11/23 0527 10/11/23 1411  BP: 127/70 (!) 155/86 114/80 (!) 146/82  Pulse: 81 83 84 76  Resp:  14 14 18   Temp: 97.7 F (36.5 C) 98.6 F (37 C)  98.2 F (36.8 C) 97.8 F (36.6 C)  TempSrc: Oral Oral Oral Oral  SpO2: 90% 93% 93% 97%  Weight:      Height:          Data Reviewed:  Basic Metabolic Panel: Recent Labs  Lab 10/05/23 1026 10/06/23 0035 10/07/23 0611 10/08/23 0748 10/08/23 0857 10/09/23 0639  NA 135 133* 135 132*  --  128*  K 4.2 4.1 3.5 3.8  --  3.9  CL 103 101 97* 100  --  95*  CO2 23 22 26 24   --  23  GLUCOSE 286* 240* 270* 422* 449* 287*  BUN 15 11 16 18   --  22*  CREATININE 0.77 0.57 0.69 0.70  --  0.69  CALCIUM 8.6* 8.6* 9.3 8.9   --  8.9    CBC: Recent Labs  Lab 10/05/23 1026 10/06/23 0035 10/07/23 0611 10/08/23 0748 10/09/23 0639 10/10/23 0545 10/11/23 0602  WBC 5.6   < > 9.0 11.4* 7.8 8.2 5.7  NEUTROABS 4.0  --   --   --  6.4  --   --   HGB 11.5*   < > 11.3* 12.1 11.6* 11.2* 11.0*  HCT 34.4*   < > 34.7* 36.4 34.4* 32.1* 32.6*  MCV 93.5   < > 92.0 94.3 92.2 89.9 91.6  PLT 301   < > 365 337 302 288 287   < > = values in this interval not displayed.    LFT Recent Labs  Lab 10/05/23 1026 10/06/23 0035 10/07/23 0611 10/08/23 0748 10/09/23 0639  AST 15 12* 13* 13* 37  ALT 13 13 13 15 24   ALKPHOS 89 87 78 73 57  BILITOT 0.4 0.5 0.4 0.4 0.5  PROT 6.8 6.4* 6.9 6.3* 5.9*  ALBUMIN 3.4* 3.3* 3.5 3.3* 2.9*     Antibiotics: Anti-infectives (From admission, onward)    Start     Dose/Rate Route Frequency Ordered Stop   10/09/23 1000  linezolid (ZYVOX) tablet 600 mg        600 mg Oral Every 12 hours 10/09/23 0750 10/15/23 0959   10/08/23 0530  linezolid (ZYVOX) IVPB 600 mg  Status:  Discontinued        600 mg 300 mL/hr over 60 Minutes Intravenous Every 12 hours 10/07/23 1918 10/09/23 0750   10/07/23 1415  linezolid (ZYVOX) IVPB 600 mg  Status:  Discontinued        600 mg 300 mL/hr over 60 Minutes Intravenous Every 12 hours 10/07/23 1316 10/07/23 1918   10/06/23 1500  clindamycin (CLEOCIN) IVPB 600 mg  Status:  Discontinued        600 mg 100 mL/hr over 30 Minutes Intravenous Every 8 hours 10/06/23 1153 10/07/23 1316        DVT prophylaxis: Lovenox  Code Status: Full code  Family Communication: No family at bedside   CONSULTS    Subjective   Denies any complaints.   Objective    Physical Examination:    General-appears in no acute distress Heart-S1-S2, regular, no murmur auscultated Lungs-clear to auscultation bilaterally, no wheezing or crackles auscultated Abdomen-soft, nontender, no organomegaly Extremities-no edema in the lower extremities Neuro-alert, oriented x3, no  focal deficit noted Status is: Inpatient:             Meredeth Ide   [facility] If 7PM-7AM, please contact night-coverage at [email or web address], Office  [phone]   10/11/2023, 3:08 PM  LOS: 6 days

## 2023-10-11 NOTE — Inpatient Diabetes Management (Signed)
Inpatient Diabetes Program Recommendations  AACE/ADA: New Consensus Statement on Inpatient Glycemic Control (2015)  Target Ranges:  Prepandial:   less than 140 mg/dL      Peak postprandial:   less than 180 mg/dL (1-2 hours)      Critically ill patients:  140 - 180 mg/dL    Latest Reference Range & Units 10/10/23 07:52 10/10/23 11:27 10/10/23 15:55 10/10/23 21:24  Glucose-Capillary 70 - 99 mg/dL 161 (H)  20 units Novolog  315 (H)  20 units Novolog  30 units Semglee  128 (H)  5 units Novolog  346 (H)  5 units Novolog   (H): Data is abnormally high  Latest Reference Range & Units 10/11/23 07:47  Glucose-Capillary 70 - 99 mg/dL 096 (H)  (H): Data is abnormally high    Home DM Meds: Novolog 4-14 units TID   Metformin 1000 mg BID   Tresiba 26 units QHS   Current Orders: Semglee 30 units QAM  Novolog 0-20 units TID ac/hs Novolog 5 units TID with meals     MD- Note pt Getting Decadron 6 mg QAM.  CBGs >300  Please consider:  1. Increase Semglee to 35 units Daily  2. Increase Novolog Meal Coverage to 10 units TID with meals    --Will follow patient during hospitalization--  Ambrose Finland RN, MSN, CDCES Diabetes Coordinator Inpatient Glycemic Control Team Team Pager: [phone] (8a-5p)

## 2023-10-11 NOTE — Plan of Care (Signed)

## 2023-10-12 ENCOUNTER — Other Ambulatory Visit: Payer: Self-pay

## 2023-10-12 ENCOUNTER — Ambulatory Visit
Admit: 2023-10-12 | Discharge: 2023-10-12 | Disposition: A | Discharge status: Home / Self Care | Payer: Medicaid Other | Attending: Radiation Oncology | Admitting: Radiation Oncology

## 2023-10-12 DIAGNOSIS — R591 Generalized enlarged lymph nodes: Secondary | ICD-10-CM | POA: Diagnosis not present

## 2023-10-12 DIAGNOSIS — E669 Obesity, unspecified: Secondary | ICD-10-CM | POA: Diagnosis not present

## 2023-10-12 DIAGNOSIS — I82C12 Acute embolism and thrombosis of left internal jugular vein: Secondary | ICD-10-CM | POA: Diagnosis not present

## 2023-10-12 DIAGNOSIS — R220 Localized swelling, mass and lump, head: Secondary | ICD-10-CM | POA: Diagnosis not present

## 2023-10-12 DIAGNOSIS — C50012 Malignant neoplasm of nipple and areola, left female breast: Secondary | ICD-10-CM

## 2023-10-12 LAB — RAD ONC ARIA SESSION SUMMARY
Course Elapsed Days: 5
Plan Fractions Treated to Date: 4
Plan Prescribed Dose Per Fraction: 2 Gy
Plan Total Fractions Prescribed: 25
Plan Total Prescribed Dose: 50 Gy
Reference Point Dosage Given to Date: 8 Gy
Reference Point Session Dosage Given: 2 Gy
Session Number: 4

## 2023-10-12 LAB — COMPREHENSIVE METABOLIC PANEL
ALT: 28 U/L (ref 0–44)
AST: 17 U/L (ref 15–41)
Albumin: 3.1 g/dL — ABNORMAL LOW (ref 3.5–5.0)
Alkaline Phosphatase: 51 U/L (ref 38–126)
Anion gap: 9 (ref 5–15)
BUN: 26 mg/dL — ABNORMAL HIGH (ref 6–20)
CO2: 27 mmol/L (ref 22–32)
Calcium: 9.1 mg/dL (ref 8.9–10.3)
Chloride: 95 mmol/L — ABNORMAL LOW (ref 98–111)
Creatinine, Ser: 0.83 mg/dL (ref 0.44–1.00)
GFR, Estimated: >60 mL/min (ref >60)
Glucose, Bld: 279 mg/dL — ABNORMAL HIGH (ref 70–99)
Potassium: 4.1 mmol/L (ref 3.5–5.1)
Sodium: 131 mmol/L — ABNORMAL LOW (ref 135–145)
Total Bilirubin: 0.4 mg/dL (ref <1.2)
Total Protein: 6.1 g/dL — ABNORMAL LOW (ref 6.5–8.1)

## 2023-10-12 LAB — GLUCOSE, CAPILLARY
Glucose-Capillary: 243 mg/dL — ABNORMAL HIGH (ref 70–99)
Glucose-Capillary: 297 mg/dL — ABNORMAL HIGH (ref 70–99)
Glucose-Capillary: 255 mg/dL — ABNORMAL HIGH (ref 70–99)

## 2023-10-12 MED ORDER — SENNA 8.6 MG PO TABS
1.0000 | ORAL_TABLET | Freq: Two times a day (BID) | ORAL | 0 refills | Status: DC
  Filled 2023-10-12: qty 100, 50d supply, fill #0
  Filled 2024-02-22: qty 100, 50d supply, fill #1

## 2023-10-12 MED ORDER — GABAPENTIN 300 MG PO CAPS
300.0000 mg | ORAL_CAPSULE | Freq: Three times a day (TID) | ORAL | 1 refills | Status: DC
  Filled 2023-10-12: qty 90, 30d supply, fill #0
  Filled 2024-02-22: qty 90, 30d supply, fill #1

## 2023-10-12 MED ORDER — ONDANSETRON HCL 8 MG PO TABS
8.0000 mg | ORAL_TABLET | Freq: Three times a day (TID) | ORAL | 1 refills | Status: DC | PRN
  Filled 2023-10-12: qty 30, 10d supply, fill #0

## 2023-10-12 MED ORDER — HYDROMORPHONE HCL 2 MG PO TABS
2.0000 mg | ORAL_TABLET | Freq: Four times a day (QID) | ORAL | 0 refills | Status: DC | PRN
  Filled 2023-10-12: qty 20, 5d supply, fill #0

## 2023-10-12 MED ORDER — SODIUM CHLORIDE 0.9 % IV SOLN: INTRAVENOUS | Status: AC

## 2023-10-12 MED ORDER — PROCHLORPERAZINE MALEATE 10 MG PO TABS
10.0000 mg | ORAL_TABLET | Freq: Four times a day (QID) | ORAL | 0 refills | Status: DC | PRN
  Filled 2023-10-12: qty 30, 8d supply, fill #0

## 2023-10-12 MED ORDER — MILK AND MOLASSES ENEMA
1.0000 | Freq: Once | RECTAL | Status: DC
  Filled 2023-10-12: qty 240

## 2023-10-12 MED ORDER — ENOXAPARIN SODIUM 80 MG/0.8ML IJ SOSY
80.0000 mg | PREFILLED_SYRINGE | Freq: Two times a day (BID) | INTRAMUSCULAR | 3 refills | Status: DC
  Filled 2023-10-12: qty 48, 30d supply, fill #0
  Filled 2023-11-28: qty 48, 30d supply, fill #1
  Filled 2024-01-17: qty 48, 30d supply, fill #2

## 2023-10-12 MED ORDER — LINEZOLID 600 MG PO TABS
600.0000 mg | ORAL_TABLET | Freq: Two times a day (BID) | ORAL | 0 refills | Status: AC
  Filled 2023-10-12: qty 4, 2d supply, fill #0

## 2023-10-12 MED ORDER — POLYETHYLENE GLYCOL 3350 17 G PO PACK
17.0000 g | PACK | Freq: Every day | ORAL | 0 refills | Status: AC | PRN
  Filled 2023-10-12: qty 14, 14d supply, fill #0

## 2023-10-12 MED ORDER — FENTANYL 50 MCG/HR TD PT72
1.0000 | MEDICATED_PATCH | TRANSDERMAL | 0 refills | Status: DC
  Filled 2023-10-12: qty 5, 15d supply, fill #0

## 2023-10-12 MED ORDER — HYDROMORPHONE HCL 2 MG PO TABS
2.0000 mg | ORAL_TABLET | ORAL | Status: DC | PRN
  Administered 2023-10-12 (×3): 2 mg via ORAL
  Filled 2023-10-12 (×3): qty 1

## 2023-10-12 MED ORDER — HEPARIN SOD (PORK) LOCK FLUSH 100 UNIT/ML IV SOLN
500.0000 [IU] | Freq: Once | INTRAVENOUS | Status: AC
Start: 2023-10-12 — End: 2023-10-12
  Administered 2023-10-12: 500 [IU] via INTRAVENOUS

## 2023-10-12 MED ORDER — ONDANSETRON HCL 8 MG PO TABS
8.0000 mg | ORAL_TABLET | Freq: Three times a day (TID) | ORAL | Status: DC | PRN
Start: 2023-10-12 — End: 2023-10-13

## 2023-10-12 MED ORDER — PANTOPRAZOLE SODIUM 40 MG PO TBEC
40.0000 mg | DELAYED_RELEASE_TABLET | Freq: Every day | ORAL | 1 refills | Status: DC
  Filled 2023-10-12: qty 30, 30d supply, fill #0

## 2023-10-12 MED ORDER — BISACODYL 10 MG RE SUPP
10.0000 mg | Freq: Once | RECTAL | Status: AC
  Administered 2023-10-12: 10 mg via RECTAL
  Filled 2023-10-12: qty 1

## 2023-10-12 NOTE — Progress Notes (Signed)
Radiation Oncology         [phone] ________________________________  Initial inpatient Consultation  Name: Ariana Herrera MRN: [id number]  Date of Service: 10/06/2023 DOB: 10-16-1966  WU:[id number], Burman Blacksmith, MD   REFERRING PHYSICIAN: [name], MD; Dr. Myna Herrera  DIAGNOSIS: The encounter diagnosis was Malignant neoplasm involving both nipple and areola of left breast in female, unspecified estrogen receptor status (HCC).    ICD-10-CM   1. Malignant neoplasm involving both nipple and areola of left breast in female, unspecified estrogen receptor status (HCC)  C50.012       HISTORY OF PRESENT ILLNESS: Ariana WINTRODE is a 57 y.o. female seen at the request of Dr. Myna Herrera for a left neck metastasis from a breast primary. She was originally diagnosed with stage IIA (pT2, pN0, cM0) triple negative invasive ductal carcinoma of the left breast and is status post lumpectomy, adjuvant chemotherapy and radiation in 2020. This was treated in [place].   She remained under observation until December of 2022 when she presented to her medical oncology NP, Ariana Herrera, with complaints of an enlarging left cervical mass. Biopsy of the mass on 11/02/2021 revealed invasive ductal carcinoma that was weakly ER positive, PR negative, and HER2 negative. PET scan from 11/17/2022 revealed left supraclavicular nodal metastasis, right axillary nodes, and a right breast nodule. She met with Dr. Myna Herrera on 11/23/2022 who recommended proceeding with systemic chemotherapy. She started her first cycle on 11/27/2021. This was discontinued early on 06/10/2022 due to non-tolerance and she was started on Trodelvy on 06/23/2022. This was then stopped on 10/28/2022 per patient request. Patient remained under surveillance under the care of Dr. Myna Herrera.   She presented to medical oncology on 07/22/2023 with complaints of ongoing left breast pain and irritation along with a 1.5 week history of  a left sided neck mass. She recommended imaging to further evaluate. PET scan on 08/04/2023 showed no evidence of recurrent or metastatic disease. Brain MRI on 09/03/2023 showed a 7 mm dural based lesion that was favored to be a meningioma but a dural metastasis was not ruled out. She then underwent a trial of Keflex and prednisone which provided no resolution in her symptoms. She was subsequently referred to ENT and neuro oncology. CT of the neck on 09/29/2023 showed a thrombus in the left jugular vein and a significant return of her breast cancer. Biopsy of the left breast from 10/03/2023 confirmed grade 3 invasive ductal carcinoma, triple negative, with a Ki67 95%. Patient met with Ariana Jacks, PA-C on 10/04/2023 who recommended inpatient admission for further management.   Ariana Herrera was admitted on 10/05/2023. MRI of the neck demonstrated a centrally necrotic left level 4 lymph node and enhancement along the entirety of the left level 5 lymph nodes; thickening and enhancement of the left sternoclavicular muscle; enhancement along the left carotid sheath throughout the left level 2, 3, and 4 stations; obstruction of the left internal jugular vein with bland thrombus extending cranially to the level of the larynx; and edema in the face and neck. She was started on a heparin drip and IV steroids. Dr. Myna Herrera saw her earlier this morning and recommended radiation to the neck metastasis given with chemotherapy.   We were kindly referred to today to discuss radiotherapy options.   PREVIOUS RADIATION THERAPY: Yes   Her right breast was treated with radiation in 2020. This was done in [place].   PAST MEDICAL HISTORY:  Past Medical History:  Diagnosis Date  Anginal pain (HCC)    Anxiety    Arthritis    Asthma    Bipolar 1 disorder (HCC)    Bipolar disease, chronic (HCC)    Breast cancer in female (HCC)    2019 and recurrent in 2023 to right breast, right axillary, and left neck   Coronary  artery disease    Deep vein blood clot of right lower extremity (HCC)    Diabetes mellitus type 2 in obese    Goals of care, counseling/discussion 11/23/2021   Hyperlipidemia    Lymphedema    MI (myocardial infarction) (HCC)    was in [place]   Obesity    Pancreatitis    Personal history of chemotherapy    Personal history of radiation therapy    Sleep apnea       PAST SURGICAL HISTORY: Past Surgical History:  Procedure Laterality Date   BREAST BIOPSY Left 10/03/2023   times 2   BREAST LUMPECTOMY Left 03/2019   CATARACT EXTRACTION Bilateral    CHOLECYSTECTOMY     IR IMAGING GUIDED PORT INSERTION  12/15/2021   IR PATIENT EVAL TECH 0-60 MINS  10/06/2023   IR REMOVAL TUN ACCESS W/ PORT W/O FL MOD SED  12/20/2019   IR US GUIDE BX ASP/DRAIN  11/02/2021   LEFT HEART CATH AND CORONARY ANGIOGRAPHY N/A 08/22/2020   Procedure: LEFT HEART CATH AND CORONARY ANGIOGRAPHY;  Surgeon: Kathleene Hazel, MD;  Location: [facility];  Service: Cardiovascular;  Laterality: N/A;   TRIGGER FINGER RELEASE Bilateral    x 5   TUBAL LIGATION     UMBILICAL HERNIA REPAIR     x 2   uterine ablation      FAMILY HISTORY:  Family History  Problem Relation Age of Onset   Heart attack Mother 23   Diabetes Mother    Breast cancer Mother    Breast cancer Cousin    Colon cancer Neg Hx    Esophageal cancer Neg Hx    Pancreatic cancer Neg Hx    Stomach cancer Neg Hx     SOCIAL HISTORY:  Social History   Socioeconomic History   Marital status: Married    Spouse name: Not on file   Number of children: Not on file   Years of education: Not on file   Highest education level: Some college, no degree  Occupational History   Occupation: disabled due to neuropathy and cancer treatments  Tobacco Use   Smoking status: Every Day    Current packs/day: 1.00    Average packs/day: 1 pack/day for 39.0 years (39.0 ttl pk-yrs)    Types: Cigarettes    Start date: 11   Smokeless tobacco:  Never  Vaping Use   Vaping status: Never Used  Substance and Sexual Activity   Alcohol use: Never   Drug use: Yes    Types: Marijuana    Comment: occasionally   Sexual activity: Not on file  Other Topics Concern   Not on file  Social History Narrative   Not on file   Social Drivers of Health   Financial Resource Strain: Medium Risk (08/09/2023)   Overall Financial Resource Strain (CARDIA)    Difficulty of Paying Living Expenses: Somewhat hard  Food Insecurity: Food Insecurity Present (10/05/2023)   Hunger Vital Sign    Worried About Running Out of Food in the Last Year: Often true    Ran Out of Food in the Last Year: Often true  Transportation Needs: No Transportation Needs (  10/05/2023)   PRAPARE - Administrator, Civil Service (Medical): No    Lack of Transportation (Non-Medical): No  Physical Activity: Insufficiently Active (08/09/2023)   Exercise Vital Sign    Days of Exercise per Week: 3 days    Minutes of Exercise per Session: 10 min  Stress: Stress Concern Present (08/09/2023)   [employer] of Occupational Health - Occupational Stress Questionnaire    Feeling of Stress : Very much  Social Connections: Moderately Isolated (08/09/2023)   Social Connection and Isolation Panel [NHANES]    Frequency of Communication with Friends and Family: Twice a week    Frequency of Social Gatherings with Friends and Family: More than three times a week    Attends Religious Services: Never    Database administrator or Organizations: No    Attends Engineer, structural: Not on file    Marital Status: Married  Catering manager Violence: Not At Risk (10/05/2023)   Humiliation, Afraid, Rape, and Kick questionnaire    Fear of Current or Ex-Partner: No    Emotionally Abused: No    Physically Abused: No    Sexually Abused: No    ALLERGIES: Lithium, Dulaglutide, and Penicillins  MEDICATIONS:  No current facility-administered medications for this encounter.    No current outpatient medications on file.   Facility-Administered Medications Ordered in Other Encounters  Medication Dose Route Frequency Provider Last Rate Last Admin   0.9 %  sodium chloride infusion   Intravenous Continuous Ariana Macho, MD 10 mL/hr at 10/10/23 0354 Restarted at 10/10/23 0354   acetaminophen (TYLENOL) tablet 650 mg  650 mg Oral Q6H PRN Regalado, Belkys A, MD   650 mg at 10/10/23 1823   Or   acetaminophen (TYLENOL) suppository 650 mg  650 mg Rectal Q6H PRN Regalado, Belkys A, MD       albuterol (PROVENTIL) (2.5 MG/3ML) 0.083% nebulizer solution 2.5 mg  2.5 mg Nebulization Q6H PRN Regalado, Belkys A, MD       alum & mag hydroxide-simeth (MAALOX/MYLANTA) 200-200-20 MG/5ML suspension 30 mL  30 mL Oral Q4H PRN Regalado, Belkys A, MD   30 mL at 10/10/23 0119   bisacodyl (DULCOLAX) suppository 10 mg  10 mg Rectal Once Meredeth Ide, MD       Chlorhexidine Gluconate Cloth 2 % PADS 6 each  6 each Topical Daily Ariana Macho, MD   6 each at 10/11/23 0852   cyclobenzaprine (FLEXERIL) tablet 5 mg  5 mg Oral BID PRN Regalado, Belkys A, MD   5 mg at 10/06/23 2137   diazepam (VALIUM) tablet 2 mg  2 mg Oral BID PRN Regalado, Belkys A, MD   2 mg at 10/12/23 1040   enoxaparin (LOVENOX) injection 80 mg  80 mg Subcutaneous Q12H Armandina Stammer, RPH   80 mg at 10/12/23 1610   feeding supplement (GLUCERNA SHAKE) (GLUCERNA SHAKE) liquid 237 mL  237 mL Oral TID BM Regalado, Belkys A, MD   237 mL at 10/12/23 1040   fentaNYL (DURAGESIC) 50 MCG/HR 1 patch  1 patch Transdermal Q72H Ariana Macho, MD   1 patch at 10/10/23 0844   gabapentin (NEURONTIN) capsule 300 mg  300 mg Oral TID Ariana Macho, MD   300 mg at 10/12/23 1040   guaiFENesin-dextromethorphan (ROBITUSSIN DM) 100-10 MG/5ML syrup 5 mL  5 mL Oral Q4H PRN Regalado, Belkys A, MD   5 mL at 10/08/23 0457   HYDROmorphone (DILAUDID) tablet 2 mg  2  mg Oral Q4H PRN Ariana Macho, MD   2 mg at 10/12/23 1040   insulin aspart  (novoLOG) injection 0-20 Units  0-20 Units Subcutaneous TID WC Regalado, Belkys A, MD   7 Units at 10/12/23 4034   insulin aspart (novoLOG) injection 0-5 Units  0-5 Units Subcutaneous QHS Regalado, Belkys A, MD   4 Units at 10/11/23 2113   insulin aspart (novoLOG) injection 5 Units  5 Units Subcutaneous TID WC Regalado, Belkys A, MD   5 Units at 10/12/23 0822   insulin glargine-yfgn (SEMGLEE) injection 40 Units  40 Units Subcutaneous Daily Meredeth Ide, MD   40 Units at 10/12/23 1035   linaclotide (LINZESS) capsule 290 mcg  290 mcg Oral QAC breakfast Regalado, Belkys A, MD   290 mcg at 10/12/23 0830   linezolid (ZYVOX) tablet 600 mg  600 mg Oral Q12H Regalado, Belkys A, MD   600 mg at 10/12/23 0900   metoprolol tartrate (LOPRESSOR) tablet 25 mg  25 mg Oral BID Regalado, Belkys A, MD   25 mg at 10/12/23 0900   nicotine (NICODERM CQ - dosed in mg/24 hours) patch 21 mg  21 mg Transdermal Daily Regalado, Belkys A, MD   21 mg at 10/12/23 0900   OLANZapine (ZYPREXA) tablet 10 mg  10 mg Oral QHS Ariana Macho, MD   10 mg at 10/11/23 2112   ondansetron (ZOFRAN) tablet 8 mg  8 mg Oral Q8H PRN Ariana Macho, MD       oxybutynin (DITROPAN-XL) 24 hr tablet 5 mg  5 mg Oral QHS Regalado, Belkys A, MD   5 mg at 10/11/23 2113   pantoprazole (PROTONIX) EC tablet 40 mg  40 mg Oral Daily Luiz Iron, NP   40 mg at 10/12/23 0900   polyethylene glycol (MIRALAX / GLYCOLAX) packet 17 g  17 g Oral BID Regalado, Belkys A, MD   17 g at 10/12/23 0900   prochlorperazine (COMPAZINE) tablet 10 mg  10 mg Oral Q6H PRN Ariana Macho, MD   10 mg at 10/08/23 2153   QUEtiapine (SEROQUEL) tablet 800 mg  800 mg Oral QHS Regalado, Belkys A, MD   800 mg at 10/11/23 2113   rosuvastatin (CRESTOR) tablet 40 mg  40 mg Oral Daily Regalado, Belkys A, MD   40 mg at 10/12/23 0900   senna (SENOKOT) tablet 8.6 mg  1 tablet Oral BID Regalado, Belkys A, MD   8.6 mg at 10/12/23 0900   senna-docusate (Senokot-S) tablet 1 tablet  1 tablet  Oral QHS PRN Regalado, Belkys A, MD       sodium chloride flush (NS) 0.9 % injection 10 mL  10 mL Intravenous PRN Ariana Macho, MD   10 mL at 08/25/23 1455    REVIEW OF SYSTEMS:  On review of systems, the patient reports that she is experiencing 9/10 pain to her neck and the back of her head. This is alleviated with Dilaudid. She notes a productive cough and some shortness of breath. She endorses a mild sore throat, but no issues with swallowing. She endorses left breast pain.     PHYSICAL EXAM:  Wt Readings from Last 3 Encounters:  10/05/23 186 lb 4.6 oz (84.5 kg)  10/04/23 192 lb (87.1 kg)  10/01/23 182 lb 15.7 oz (83 kg)   Temp Readings from Last 3 Encounters:  10/12/23 97.7 F (36.5 C) (Oral)  10/04/23 98.4 F (36.9 C) (Oral)  10/01/23 98.1 F (36.7 C) (Oral)  BP Readings from Last 3 Encounters:  10/12/23 (!) 164/70  10/04/23 (!) 130/55  10/01/23 117/77   Pulse Readings from Last 3 Encounters:  10/12/23 66  10/04/23 (!) 105  10/01/23 100    /10  In general this is a well appearing female in no acute distress. She is alert and oriented x4 and appropriate throughout the examination. HEENT reveals facial swelling and pre-orbital swelling, greater in the left eye. EOMs are intact. Skin is intact without any evidence of gross lesions. Cardiovascular exam reveals a regular rate and rhythm, no clicks rubs or murmurs are auscultated. Chest is clear to auscultation bilaterally. Palpable left neck mass that extends to the posterior neck. No palpable supraclavicular nodes. Abdomen has active bowel sounds in all quadrants and is intact. The abdomen is soft, non tender, non distended. Lower extremities are negative for pretibial pitting edema, deep calf tenderness, cyanosis or clubbing.   ECOG = 1  0 - Asymptomatic (Fully active, able to carry on all predisease activities without restriction)  1 - Symptomatic but completely ambulatory (Restricted in physically strenuous activity  but ambulatory and able to carry out work of a light or sedentary nature. For example, light housework, office work)  2 - Symptomatic, <50% in bed during the day (Ambulatory and capable of all self care but unable to carry out any work activities. Up and about more than 50% of waking hours)  3 - Symptomatic, >50% in bed, but not bedbound (Capable of only limited self-care, confined to bed or chair 50% or more of waking hours)  4 - Bedbound (Completely disabled. Cannot carry on any self-care. Totally confined to bed or chair)  5 - Death   Santiago Glad MM, Creech RH, Tormey DC, et al. [phone]). "Toxicity and response criteria of the [facility]". Am. Evlyn Clines. Oncol. 5 (6): 649-55   LABORATORY DATA:  Lab Results  Component Value Date   WBC 5.7 10/11/2023   HGB 11.0 (L) 10/11/2023   HCT 32.6 (L) 10/11/2023   MCV 91.6 10/11/2023   PLT 287 10/11/2023   Lab Results  Component Value Date   NA 131 (L) 10/12/2023   K 4.1 10/12/2023   CL 95 (L) 10/12/2023   CO2 27 10/12/2023   Lab Results  Component Value Date   ALT 28 10/12/2023   AST 17 10/12/2023   ALKPHOS 51 10/12/2023   BILITOT 0.4 10/12/2023     RADIOGRAPHY: CT CHEST ABDOMEN PELVIS W CONTRAST Result Date: 10/06/2023 CLINICAL DATA:  Metastatic breast cancer with recent left breast recurrence. Assess treatment response. * Tracking Code: BO * EXAM: CT CHEST, ABDOMEN, AND PELVIS WITH CONTRAST TECHNIQUE: Multidetector CT imaging of the chest, abdomen and pelvis was performed following the standard protocol during bolus administration of intravenous contrast. RADIATION DOSE REDUCTION: This exam was performed according to the departmental dose-optimization program which includes automated exposure control, adjustment of the mA and/or kV according to patient size and/or use of iterative reconstruction technique. CONTRAST:  OMNIPAQUE IOHEXOL 300 MG/ML  SOLN COMPARISON:  CTA of the chest, abdomen and pelvis 08/20/2020.  PET-CT 08/04/2023 and 03/04/2023. Ultrasound 06/28/2023. FINDINGS: CT CHEST FINDINGS Cardiovascular: Right IJ Port-A-Cath extends to the lower SVC level. No acute vascular findings are demonstrated. There is mild atherosclerosis of the aorta, great vessels and coronary arteries. The heart size is normal. There is no pericardial effusion. Mediastinum/Nodes: There are no enlarged mediastinal, hilar, axillary or internal mammary lymph nodes. There are apparent surgical changes in the left supraclavicular  area with irregular subcutaneous nodularity, similar to most recent PET-CT and not hypermetabolic, although not seen on the 03/04/2023 PET-CT. Low-density left supraclavicular nodule measuring 2.0 x 1.2 cm on image 5/2 is grossly unchanged from previous PET-CT as well, not hypermetabolic. The thyroid gland, trachea and esophagus demonstrate no significant findings. Lungs/Pleura: No pleural effusion or pneumothorax. There are stable subpleural radiation changes anteriorly in the left upper lobe. Mild central airway thickening noted. No focal airspace disease or suspicious pulmonary nodularity. Musculoskeletal/Chest wall: Grossly stable postsurgical changes in the left breast with diffuse dermal and trabecular thickening, similar to recent PET-CT. New soft tissue emphysema laterally in the left breast attributed to recent biopsy. No other chest wall mass or suspicious osseous finding. CT ABDOMEN AND PELVIS FINDINGS Hepatobiliary: The liver is normal in density without suspicious focal abnormality. No significant biliary dilatation status post cholecystectomy. Pancreas: Generalized atrophy. No focal mass lesion, ductal dilatation or surrounding inflammation. Spleen: Normal in size without focal abnormality. Adrenals/Urinary Tract: Both adrenal glands appear normal. No evidence of urinary tract calculus, suspicious renal lesion or hydronephrosis. Mild renal cortical scarring bilaterally. No bladder abnormality identified.  Stomach/Bowel: No enteric contrast administered. The stomach appears unremarkable for its degree of distension. No evidence of bowel wall thickening, distention or surrounding inflammatory change. The appendix appears normal. There is prominent stool throughout the colon. Vascular/Lymphatic: There are no enlarged abdominal or pelvic lymph nodes. Diffuse aortic and branch vessel atherosclerosis without evidence of aneurysm or large vessel occlusion. There is mild diffuse common iliac artery luminal narrowing bilaterally. Reproductive: The uterus and ovaries appear unremarkable. No adnexal mass. Other: Small periumbilical hernia containing only fat. No ascites or peritoneal nodularity. No pneumoperitoneum. Musculoskeletal: No acute or significant osseous findings. IMPRESSION: 1. Possible postsurgical changes in the left supraclavicular area, similar to PET-CT of 2 months ago but new from 03/04/2023. There was no significant hypermetabolic activity in these areas at recent PET-CT, although metastatic disease is a concern, especially if there has been no prior surgery in this area. The patient did present for ultrasound with a palpable abnormality in this region 3 months ago. Consider dedicated CT of the neck or follow up PET-CT. 2. Postsurgical changes in the left breast, grossly stable. 3. No evidence of distant metastatic disease in the chest, abdomen or pelvis. 4. Prominent stool throughout the colon suggesting constipation. 5.  Aortic Atherosclerosis (ICD10-I70.0). Electronically Signed   By: Carey Bullocks M.D.   On: 10/06/2023 12:40   IR PATIENT EVAL TECH 0-60 MINS Result Date: 10/06/2023 Barrett Shell, RT     10/06/2023  9:13 AM Patient was seen today in IR for portacath evaluation due to port not getting blood return.  Portacath was accessed and flushed well.  No blood return was noted, at this time, a power flush of the portacath was performed. After power flushing portacath, blood return was noted.   Port was then flushed again and blood return noted again.  At this time, portacath was left accessed, and a new, clean dry dressing was placed over access needle.  DG Chest 2 View Result Date: 10/05/2023 CLINICAL DATA:  facial swelling, neck swelling, IJV thrombosis, known breast cancer, chest mass?Marland Kitchen EXAM: CHEST - 2 VIEW COMPARISON:  06/12/2023. FINDINGS: Bilateral lung fields are clear. Bilateral costophrenic angles are clear. Normal cardio-mediastinal silhouette. No acute osseous abnormalities. The soft tissues are within normal limits. Right-sided CT Port-A-Cath is again seen with its tip overlying the cavoatrial junction region. IMPRESSION: No active cardiopulmonary disease. Electronically Signed  By: Jules Schick M.D.   On: 10/05/2023 15:14   MR Neck Soft Tissue Only W or Wo Contrast Result Date: 10/05/2023 CLINICAL DATA:  Neck mass, nonpulsatile.  Recurrent breast cancer. EXAM: MRI OF THE NECK WITH CONTRAST TECHNIQUE: Multiplanar, multisequence MR imaging was performed following the administration of intravenous contrast. CONTRAST:  10mL GADAVIST GADOBUTROL 1 MMOL/ML IV SOLN COMPARISON:  Neck CT 09/29/2023. FINDINGS: Pharynx and larynx: No intraluminal mass or submucosal edema. Edema/fluid in the left parapharyngeal and retropharyngeal spaces, likely secondary to venous and/or lymphatic obstruction. Salivary glands: Asymmetric edema and hyperenhancement of the left parotid gland, likely secondary to venous and/or lymphatic obstruction. Thyroid: Normal. Lymph nodes: Centrally necrotic left level 4 lymph node measures up to 21 x 17 mm (axial image 44 series 13). Ill-defined soft tissue signal and enhancement extending along the entirety of left level 5 may represent metastatic disease in the lymph nodes with extracapsular extension. Thickening and enhancement of the left sternoclavicular muscle likely reflects direct invasion with tumor. Confluent enhancement along the left carotid sheath throughout  the left level 2, 3 and 4 stations is favored to reflect nodal metastatic disease with extracapsular extension. Vascular: Obstruction of the left internal jugular vein at the level of the necrotic left level 4 lymph node with bland thrombus extending cranially to the level of the larynx (axial image 41 series 11 and 13). Limited intracranial: Unremarkable. Visualized orbits: Periorbital edema in the left, likely secondary to venous and/or lymphatic obstruction. Mastoids and visualized paranasal sinuses: Well aerated. Skeleton: Edema in the right-greater-than-left dorsal paraspinal muscles along the lower cervical spine is favored to reflect muscle strain. Upper chest: Unremarkable. Other: None. IMPRESSION: 1. Centrally necrotic left level 4 lymph node measures up to 21 x 17 mm, consistent with nodal metastatic disease. Ill-defined soft tissue signal and enhancement extending along the entirety of left level 5 may represent metastatic disease in the lymph nodes with extracapsular extension. Thickening and enhancement of the left sternoclavicular muscle likely reflects direct invasion with tumor. Confluent enhancement along the left carotid sheath throughout the left level 2, 3 and 4 stations is favored to reflect nodal metastatic disease with extracapsular extension. 2. Obstruction of the left internal jugular vein at the level of the necrotic left level 4 lymph node with bland thrombus extending cranially to the level of the larynx. 3. Edema/fluid in the superficial left face/neck, as well as the left parapharyngeal and retropharyngeal spaces, likely secondary to venous and/or lymphatic obstruction. 4. Edema in the right-greater-than-left dorsal paraspinal muscles along the lower cervical spine is favored to reflect muscle strain. Electronically Signed   By: Orvan Falconer M.D.   On: 10/05/2023 13:39   MR Brain W and Wo Contrast Result Date: 10/05/2023 CLINICAL DATA:  Brain/CNS neoplasm, staging. Recurrent  breast cancer. EXAM: MRI HEAD WITHOUT AND WITH CONTRAST TECHNIQUE: Multiplanar, multiecho pulse sequences of the brain and surrounding structures were obtained without and with intravenous contrast. CONTRAST:  10mL GADAVIST GADOBUTROL 1 MMOL/ML IV SOLN COMPARISON:  Brain MRI 09/03/2023. FINDINGS: Brain: Unchanged 7 mm dural-based enhancing lesion along the left middle frontal gyrus, again favored to reflect a small meningioma. No new foci of abnormal intracranial enhancement. No acute infarct or hemorrhage. No hydrocephalus or extra-axial collection. Vascular: Normal flow voids and vessel enhancement. Skull and upper cervical spine: Normal marrow signal and enhancement. Sinuses/Orbits: No acute findings. Other: None. IMPRESSION: 1. No evidence of intracranial metastatic disease. 2. Unchanged 7 mm dural-based enhancing lesion along the left middle frontal gyrus, again  favored to reflect a small meningioma. Electronically Signed   By: Orvan Falconer M.D.   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 This was found to be concordant by Dr. Laveda Abbe. Pathology revealed GRADE III INVASIVE DUCTAL CARCINOMA of the LEFT breast, upper outer, anterior, (cylinder clip). This was found to be concordant by Dr. Laveda Abbe. Pathology results were discussed with the patient by telephone by myself and in person by Ariana Jacks, PA-C with [facility], [facility] location. The patient reported doing well after the biopsies with tenderness at the sites. Post biopsy instructions and care were reviewed and questions were answered. The patient was encouraged to call [facility] for any additional concerns. My direct phone number was provided. 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 Pathology results reported by Rene Kocher, RN on 10/05/2023. Electronically Signed   By: Harmon Pier M.D.   On: 10/05/2023 08:06   Result Date: 10/05/2023 CLINICAL DATA:  57 year old female presents for tissue sampling of 2 sites of UPPER OUTER LEFT breast non masslike enhancement. History of LEFT breast cancer and treatment. EXAM: MRI GUIDED CORE NEEDLE BIOPSY OF THE LEFT BREAST X 2 TECHNIQUE: Multiplanar, multisequence MR imaging of the LEFT breast was performed both before and after administration of intravenous contrast. CONTRAST:  9 cc intravenous Vueway COMPARISON:  Previous exam(s). FINDINGS: I met with the patient, and we discussed the procedure of MRI guided biopsy, including risks, benefits, and alternatives. Specifically, we discussed the risks of infection, bleeding, tissue injury, clip migration, and inadequate sampling. Informed, written consent was given. The usual time out protocol was performed immediately prior to the procedure. MRI GUIDED CORE NEEDLE BIOPSY OF THE LEFT BREAST #1 (posterior aspect of UPPER-OUTER LEFT breast non masslike enhancement-BARBELL clip): Using sterile technique, 1% Lidocaine with and without epinephrine, MRI guidance, and a 9 gauge vacuum assisted device, biopsy was performed of the posterior aspect of UPPER-OUTER LEFT breast non masslike enhancement using a LATERAL approach. At the conclusion of the procedure, a BARBELL tissue marker clip was deployed into the biopsy cavity. Follow-up 2-view mammogram was performed and dictated separately. MRI GUIDED CORE NEEDLE BIOPSY OF THE LEFT BREAST #2 (anterior aspect of UPPER-OUTER LEFT breast non masslike enhancement-CYLINDER clip): Using sterile technique, 1% Lidocaine with and without epinephrine, MRI guidance, and a 9 gauge vacuum assisted device, biopsy was performed of  the anterior aspect of UPPER-OUTER LEFT breast non masslike enhancement using a LATERAL approach. At the conclusion of the procedure, a CYLINDER tissue marker clip was deployed into the biopsy cavity. Follow-up 2-view mammogram was performed and dictated separately. IMPRESSION: MRI guided biopsy of the posterior aspect of UPPER-OUTER LEFT breast non masslike enhancement (BARBELL clip). MR guided biopsy of the anterior aspect of UPPER-OUTER LEFT breast non masslike enhancement (CYLINDER clip). No apparent complications. Electronically Signed: By: Harmon Pier M.D. 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 This was found to be concordant by Dr.  Laveda Abbe. Pathology revealed GRADE III INVASIVE DUCTAL CARCINOMA of the LEFT breast, upper outer, anterior, (cylinder clip). This was found to be concordant by Dr. Laveda Abbe. Pathology results were discussed with the patient by telephone by myself and in person by Ariana Jacks, PA-C with [facility], [facility] location. The patient reported doing well after the biopsies with tenderness at the sites. Post biopsy instructions and care were reviewed and questions were answered. The patient was encouraged to call [facility] for any additional concerns. My direct phone number was provided. 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 Pathology results reported by Rene Kocher, RN on 10/05/2023. Electronically Signed   By: Harmon Pier M.D.   On: 10/05/2023 08:06   Result Date: 10/05/2023 CLINICAL DATA:  57 year old female presents for tissue sampling of 2 sites of UPPER OUTER  LEFT breast non masslike enhancement. History of LEFT breast cancer and treatment. EXAM: MRI GUIDED CORE NEEDLE BIOPSY OF THE LEFT BREAST X 2 TECHNIQUE: Multiplanar, multisequence MR imaging of the LEFT breast was performed both before and after administration of intravenous contrast. CONTRAST:  9 cc intravenous Vueway COMPARISON:  Previous exam(s). FINDINGS: I met with the patient, and we discussed the procedure of MRI guided biopsy, including risks, benefits, and alternatives. Specifically, we discussed the risks of infection, bleeding, tissue injury, clip migration, and inadequate sampling. Informed, written consent was given. The usual time out protocol was performed immediately prior to the procedure. MRI GUIDED CORE NEEDLE BIOPSY OF THE LEFT BREAST #1 (posterior aspect of UPPER-OUTER LEFT breast non masslike enhancement-BARBELL clip): Using sterile technique, 1% Lidocaine with and without epinephrine, MRI guidance, and a 9 gauge vacuum assisted device, biopsy was performed of the posterior aspect of UPPER-OUTER LEFT breast non masslike enhancement using a LATERAL approach. At the conclusion of the procedure, a BARBELL tissue marker clip was deployed into the biopsy cavity. Follow-up 2-view mammogram was performed and dictated separately. MRI GUIDED CORE NEEDLE BIOPSY OF THE LEFT BREAST #2 (anterior aspect of UPPER-OUTER LEFT breast non masslike enhancement-CYLINDER clip): Using sterile technique, 1% Lidocaine with and without epinephrine, MRI guidance, and a 9 gauge vacuum assisted device, biopsy was performed of the anterior aspect of UPPER-OUTER LEFT breast non masslike enhancement using a LATERAL approach. At the conclusion of the procedure, a CYLINDER tissue marker clip was deployed into the biopsy cavity. Follow-up 2-view mammogram was performed and dictated separately. IMPRESSION: MRI guided biopsy of the posterior aspect of UPPER-OUTER LEFT breast non masslike enhancement (BARBELL clip). MR guided  biopsy of the anterior aspect of UPPER-OUTER LEFT breast non masslike enhancement (CYLINDER clip). No apparent complications. Electronically Signed: By: Harmon Pier M.D. On: 10/03/2023 10:45   MM CLIP PLACEMENT LEFT Result Date: 10/03/2023 CLINICAL DATA:  Evaluate placement of biopsy clips following 2 site MR guided LEFT breast biopsies. EXAM: 3D DIAGNOSTIC LEFT MAMMOGRAM POST MRI BIOPSY COMPARISON:  Previous exam(s). FINDINGS: 3D Mammographic images were obtained following MR guided biopsy of the posterior aspect (BARBELL clip) and anterior aspect (CYLINDER clip) of UPPER-OUTER LEFT breast non masslike enhancement. The BARBELL biopsy marking clip is in expected position at the site of biopsy. The CYLINDER biopsy marking clip is in expected position at the site of biopsy. IMPRESSION: Appropriate positioning of the BARBELL shaped biopsy marking clip at the site of biopsy in the UPPER OUTER LEFT breast. Appropriate positioning of  the CYLINDER shaped biopsy marking clip at the site of biopsy in the UPPER OUTER LEFT breast. Final Assessment: Post Procedure Mammograms for Marker Placement Electronically Signed   By: Harmon Pier M.D.   On: 10/03/2023 10:58   CT Soft Tissue Neck W Contrast Result Date: 09/29/2023 CLINICAL DATA:  Left parotid region swelling over the last 2 months. History of breast cancer. EXAM: CT NECK WITH CONTRAST TECHNIQUE: Multidetector CT imaging of the neck was performed using the standard protocol following the bolus administration of intravenous contrast. RADIATION DOSE REDUCTION: This exam was performed according to the departmental dose-optimization program which includes automated exposure control, adjustment of the mA and/or kV according to patient size and/or use of iterative reconstruction technique. CONTRAST:  OMNIPAQUE IOHEXOL 300 MG/ML  SOLN COMPARISON:  Ultrasound 06/28/2023. PET scan 08/04/2023. PET scan 03/04/2023. FINDINGS: Pharynx and larynx: No mucosal or submucosal  lesion. Salivary glands: Right parotid gland is normal. Both submandibular glands are normal. There is edematous change throughout the left parotid gland. Question if there is a newly seen 13 mm nodular density at the tip of the superficial lobe. See below. Thyroid: Normal Lymph nodes: No lymphadenopathy in the right neck. On the left, there is increasing density in the supraclavicular region which appears dense/enhancing and irregular. This is worrisome for local recurrence of malignancy. As discussed below, there is thrombosis of the left jugular vein which is associated with edema of the left face and neck, which makes other detail difficult. Axial image 31 shows a malignant appearing level 2 nodal mass measuring up to 2 cm. There are probably other malignant nodes in the level 2 to level 3 region but these are difficult to separate out from the regional inflammatory changes. The left sternocleidomastoid muscle is also swollen and shows areas of heterogeneous density. Direct tumor invasion/involvement of the muscle is possible. Axial image 57 shows a 2 cm low-density lesion which could be a necrotic mass or could relate in some way to the thrombosed jugular vein. Vascular: Arterial structures are patent. As noted above, there is thrombosis of the left jugular vein. I think this is probably secondary to regional progressive tumor. Considerable regional edema associated without venous thrombosis. Limited intracranial: Normal Visualized orbits: Normal except for superficial swelling of the eyelids on the left. Mastoids and visualized paranasal sinuses: Mild mucosal thickening of the right maxillary sinus. Other sinuses are clear. Mastoids are clear. Skeleton: No evidence of regional bony metastatic disease. Upper chest: Lung apices are clear except for post radiation scarring. There is a small amount of pericardial fluid noted. Small superior mediastinal nodes are present, nonspecific. Other: None IMPRESSION: 1.  Increasing density in the left supraclavicular region which appears dense/enhancing and irregular. This is worrisome for local recurrence of malignancy. There is thrombosis of the left jugular vein which is associated with edema of the left face and neck, which makes other detail difficult. There is a malignant appearing level 2 nodal mass measuring up to 2 cm. There are probably other malignant nodes in the level 2 to level 3 region but these are difficult to separate out from the regional inflammatory changes. The left sternocleidomastoid muscle is also swollen and shows areas of heterogeneous density. Direct tumor invasion/involvement of the muscle is possible. Enhancing nodule within the inferior aspect of the left parotid gland could be a malignant intraparotid node. 2. Small amount of pericardial fluid. Small superior mediastinal nodes, nonspecific. 3. MRI of the neck with contrast could be useful in determining all  of the locations of potential malignancy as well as further understanding the degree and nature of the left jugular thrombosis. Electronically Signed   By: Paulina Fusi M.D.   On: 09/29/2023 16:09      IMPRESSION/PLAN: 1. 57 y.o. 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   We have reviewed the patient's case today and pertinent imaging. 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 Dr. Myna Herrera is recommending radiosensitizing chemotherapy given with radiation to treat the neck metastasis. She is a good candidate for radiation to provide locoregional disease control.   Today, we talked to the patient about the findings and work-up thus far.  We discussed the natural history of metastatic breast cancer and general treatment, highlighting the role of radiotherapy in the management.  We discussed the available radiation techniques, and focused on the details of logistics and delivery.   We reviewed the anticipated acute and late sequelae associated with radiation in this setting. We specifically reviewed the risks associated with radiation to the neck. The patient was encouraged to ask questions that I answered to the best of my ability.  A patient consent form was discussed and signed.  We retained a copy for our records.  The patient would like to proceed with radiation and is scheduled for CT simulation later this afternoon. Anticipate 5 weeks of radiation to the left neck.   We look forward to participating in this patient's care.    I personally spent 60 minutes in this encounter including chart review, reviewing radiological studies, meeting face-to-face with the patient, entering orders and completing documentation.     Bryan Lemma, PA-C   Billie Lade, PhD, MD   [facility]  Radiation Oncology Direct Dial: [phone]  Fax: [phone] [email or web address]

## 2023-10-12 NOTE — Progress Notes (Signed)
[facility]  PROGRESS NOTE  Ariana Herrera QMV:[id number] DOB: February 16, 1966 DOA: 10/05/2023 PCP: Alfredia Ferguson, PA-C   Brief HPI:   57 year old with past medical history significant for infiltrating ductal carcinoma of the left breast, triple negative recurrent disease under the care of Dr. 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 -Dr. Myna Hidalgo has discontinued Decadron. -Continue with IV Decadron, reduce dose to 6 mg. Will follow Dr Myna Hidalgo recommendation in regards discontinuation of decadron.  -Radiation oncology consulted, patient underwent  CT simulation.  They anticipate 5 weeks of radiation to the left neck -Covering for cellulitis periorbital as well with linezolid.  -Started radiation 12/13.     2-7 mm dural based enhancing lesion along the left medial frontal gyrus, favored to reflect a small meningioma; -Follow Dr. Myna Hidalgo recommendation   Diabetes type 2 with hyperglycemia uncontrolled, in setting of steroids.  -Blood glucose has been elevated, will increase Semglee to 40 units subcu daily -Continue meal coverage with NovoLog 5 units 3 times daily  -Continue sliding scale insulin with NovoLog  Pseudohyponatremia -Sodium improved to 131 -Follow BMP in am    Recurrent left ductal carcinoma of breast cancer;  -Followed by Dr. Myna Hidalgo now with concern of metastatic disease to the neck -Dr. Myna Hidalgo consulted -Currently getting chemotherapy -CT abdomen and pelvis obtained: No evidence of distant metastatic disease in the chest, abdomen or pelvis.   Hyperlipidemia:  -Continue fenobrite.   -Continue with Crestor as advised by her cardiology    Hypertension: Continue with  metoprolol  Constipation; Started  bowel regimen.  -No improvement with Dulcolax suppository x 1 -Will do milk and molasses enema x 1   Medications     Chlorhexidine Gluconate Cloth  6 each Topical Daily   enoxaparin (LOVENOX) injection  80 mg Subcutaneous Q12H   feeding supplement (GLUCERNA SHAKE)  237 mL Oral TID BM   fentaNYL  1 patch Transdermal Q72H   gabapentin  300 mg Oral TID   insulin aspart  0-20 Units Subcutaneous TID WC   insulin aspart  0-5 Units Subcutaneous QHS   insulin aspart  5 Units Subcutaneous TID WC   insulin glargine-yfgn  40 Units Subcutaneous Daily   linaclotide  290 mcg Oral QAC breakfast   linezolid  600 mg Oral Q12H   metoprolol tartrate  25 mg Oral BID   milk and molasses  1 enema Rectal Once   nicotine  21 mg Transdermal Daily   OLANZapine  10 mg Oral QHS   oxybutynin  5 mg Oral QHS   pantoprazole  40 mg Oral Daily   polyethylene glycol  17 g Oral BID   QUEtiapine  800 mg Oral QHS   rosuvastatin  40 mg Oral Daily   senna  1 tablet Oral BID     Data Reviewed:   CBG:  Recent Labs  Lab 10/11/23 1134 10/11/23 1634 10/11/23 2037 10/12/23 0808 10/12/23 1213  GLUCAP 328* 183* 336* 243* 297*    SpO2: 100 %    Vitals:   10/11/23 2036 10/11/23 2113 10/12/23 0415 10/12/23 0804  BP: (!) 168/76 (!) 168/76 (!) 130/59 (!) 164/70  Pulse: 97 97 84 66  Resp: 16  14 16   Temp: 98.8 F (37.1 C)  98.6 F (37 C) 97.7 F (36.5 C)  TempSrc: Oral  Oral Oral  SpO2: 96%  94% 100%  Weight:      Height:          Data Reviewed:  Basic Metabolic Panel: Recent Labs  Lab 10/06/23 0035 10/07/23 0611 10/08/23 0748 10/08/23 0857 10/09/23 0639 10/12/23 0603  NA 133* 135 132*  --  128* 131*  K 4.1 3.5 3.8  --  3.9 4.1  CL 101 97* 100  --  95* 95*  CO2 22 26 24   --  23 27  GLUCOSE 240* 270* 422* 449* 287* 279*  BUN 11 16 18   --  22* 26*  CREATININE 0.57 0.69 0.70  --  0.69 0.83  CALCIUM 8.6* 9.3 8.9  --  8.9 9.1    CBC: Recent Labs  Lab  10/07/23 0611 10/08/23 0748 10/09/23 0639 10/10/23 0545 10/11/23 0602  WBC 9.0 11.4* 7.8 8.2 5.7  NEUTROABS  --   --  6.4  --   --   HGB 11.3* 12.1 11.6* 11.2* 11.0*  HCT 34.7* 36.4 34.4* 32.1* 32.6*  MCV 92.0 94.3 92.2 89.9 91.6  PLT 365 337 302 288 287    LFT Recent Labs  Lab 10/06/23 0035 10/07/23 0611 10/08/23 0748 10/09/23 0639 10/12/23 0603  AST 12* 13* 13* 37 17  ALT 13 13 15 24 28   ALKPHOS 87 78 73 57 51  BILITOT 0.5 0.4 0.4 0.5 0.4  PROT 6.4* 6.9 6.3* 5.9* 6.1*  ALBUMIN 3.3* 3.5 3.3* 2.9* 3.1*     Antibiotics: Anti-infectives (From admission, onward)    Start     Dose/Rate Route Frequency Ordered Stop   10/09/23 1000  linezolid (ZYVOX) tablet 600 mg        600 mg Oral Every 12 hours 10/09/23 0750 10/15/23 0959   10/08/23 0530  linezolid (ZYVOX) IVPB 600 mg  Status:  Discontinued        600 mg 300 mL/hr over 60 Minutes Intravenous Every 12 hours 10/07/23 1918 10/09/23 0750   10/07/23 1415  linezolid (ZYVOX) IVPB 600 mg  Status:  Discontinued        600 mg 300 mL/hr over 60 Minutes Intravenous Every 12 hours 10/07/23 1316 10/07/23 1918   10/06/23 1500  clindamycin (CLEOCIN) IVPB 600 mg  Status:  Discontinued        600 mg 100 mL/hr over 30 Minutes Intravenous Every 8 hours 10/06/23 1153 10/07/23 1316        DVT prophylaxis: Lovenox  Code Status: Full code  Family Communication: No family at bedside   CONSULTS    Subjective   Complains of constipation   Objective    Physical Examination:  General-appears in no acute distress Heart-S1-S2, regular, no murmur auscultated Lungs-clear to auscultation bilaterally, no wheezing or crackles auscultated Abdomen-soft, nontender, no organomegaly Extremities-no edema in the lower extremities Neuro-alert, oriented x3, no focal deficit noted  Status is: Inpatient:             Piercen Covino S Wallie Lagrand   [facility] If 7PM-7AM, please contact night-coverage at [email or web address], Office   [phone]   10/12/2023, 2:34 PM  LOS: 7 days

## 2023-10-12 NOTE — Addendum Note (Signed)
Encounter addended by: Antony Blackbird, MD on: 10/12/2023 12:17 PM  Actions taken: Clinical Note Signed

## 2023-10-12 NOTE — Progress Notes (Signed)
She is completed her chemotherapy.  Everything is doing pretty good.  She is getting radiation therapy.  Her labs look all right although I think she probably is little bit hypovolemic.  I think she probably needs some IV fluid.  Think she can probably go home today.  She has radiation therapy today.  I will stop the Decadron.  This will have her blood sugars.  Her sodium is 131.  Potassium 4.1.  BUN 26 creatinine 0.83.  Calcium 9.1 with an albumin of 3.1.  She is having no problems swallowing.  She is eating okay.  There is been no vomiting.  She does have a little bit of nausea.  We did start olanzapine yesterday..  When she goes home, she only needs the Duragesic patch in the Dilaudid orally.  She needs to be sent home with the Zofran and the Compazine for nausea as needed.  She also needs to be on Protonix at home.  Again, should be followed by radiation oncology as she goes there every day.  We will plan to see her back the week after Christmas.  Hopefully, we will be able to see some improvement in the facial swelling.  Again, she has the Port-A-Cath.  We really need to use the Port-A-Cath while she is in the hospital.  I would take out the peripheral IV in her right arm and put IV fluid through the Port-A-Cath.  Christin Bach, MD  Franky Macho 2:7

## 2023-10-12 NOTE — Plan of Care (Signed)
  Problem: Skin Integrity: Goal: Risk for impaired skin integrity will decrease Outcome: Progressing   Problem: Tissue Perfusion: Goal: Adequacy of tissue perfusion will improve Outcome: Progressing   Problem: Education: Goal: Knowledge of General Education information will improve Description: Including pain rating scale, medication(s)/side effects and non-pharmacologic comfort measures Outcome: Progressing   Problem: Activity: Goal: Risk for activity intolerance will decrease Outcome: Progressing   Problem: Coping: Goal: Level of anxiety will decrease Outcome: Progressing   Problem: Pain Management: Goal: General experience of comfort will improve Outcome: Progressing   Problem: Safety: Goal: Ability to remain free from injury will improve Outcome: Progressing

## 2023-10-12 NOTE — Discharge Summary (Addendum)
Physician Discharge Summary   Patient: Ariana Herrera MRN: [id number] DOB: [birth year]  Admit date:     10/05/2023  Discharge date: 10/12/23  Discharge Physician: Meredeth Ide   PCP: Alfredia Ferguson, PA-C   Recommendations at discharge:    Follow up Dr Myna Hidalgo as outpatient  Discharge Diagnoses: Principal Problem:   Facial swelling Active Problems:   Breast cancer (HCC)   Type 2 diabetes mellitus with hyperglycemia, with long-term current use of insulin (HCC)   Obesity (BMI 30-39.9)   Internal jugular (IJ) vein thromboembolism, acute (HCC)  Resolved Problems:   * No resolved hospital problems. 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 Myna Hidalgo who presents with worsening neck swelling which show multiple necrotic lymphadenopathy and IJ vein thrombosis   Assessment and Plan:  Left internal jugular vein thrombosis and obstruction with necrotic lymph nodes In the setting of metastatic breast cancer -Continue Lovenox 80 mg subcut q 12 hr -Started on IV Decadron, reduced to 6 mg daily. -Dr. Myna Hidalgo has discontinued Decadron.  -Radiation oncology consulted, patient underwent  CT simulation.  They anticipate 5 weeks of radiation to the left neck -Covering for cellulitis periorbital as well with linezolid for 2 more days -Started radiation 12/13.  -will discharge on Fentanyl patch and  po dilaudid prn   2-7 mm dural based enhancing lesion along the left medial frontal gyrus, favored to reflect a small meningioma; -Follow Dr. Myna Hidalgo recommendation   Diabetes type 2 with hyperglycemia uncontrolled, in setting of steroids.  - continue home medications   Pseudohyponatremia -Sodium improved to 131      Recurrent left ductal carcinoma of breast cancer;  -Followed by Dr. Myna Hidalgo now with concern of metastatic disease to the neck -Dr. Myna Hidalgo  consulted -Currently getting chemotherapy -CT abdomen and pelvis obtained: No evidence of distant metastatic disease in the chest, abdomen or pelvis.   Hyperlipidemia:  -Continue fenobrite.   -Continue with Crestor as advised by her cardiology    Hypertension: Continue with metoprolol   Constipation; Started  bowel regimen.          Consultants: Oncology Procedures performed:  Disposition: Home Diet recommendation:  Regular diet DISCHARGE MEDICATION: Allergies as of 10/12/2023       Reactions   Lithium Other (See Comments)   Spinal fluid built up in brain   Dulaglutide Nausea And Vomiting, Other (See Comments)   TRULICITY   Penicillins Other (See Comments)   UNKNOWN CHILDHOOD REACTION        Medication List     STOP taking these medications    diclofenac 75 MG EC tablet Commonly known as: VOLTAREN   HYDROcodone-acetaminophen 7.5-325 MG tablet Commonly known as: Norco   Ibuprofen 200 MG Caps   omeprazole 40 MG capsule Commonly known as: PRILOSEC       TAKE these medications    Accu-Chek Guide test strip Generic drug: glucose blood 3 (three) times daily.   albuterol 1.25 MG/3ML nebulizer solution Commonly known as: ACCUNEB Take 1 ampule by nebulization every 6 (six) hours as needed for wheezing or shortness of breath.   albuterol 108 (90 Base) MCG/ACT inhaler Commonly known as: VENTOLIN HFA Inhale 2 puffs into the lungs every 6 (six) hours as needed for wheezing or shortness of breath.   cyclobenzaprine 5 MG tablet Commonly known as: FLEXERIL Take 5 mg by mouth 2 (two) times daily as needed for  muscle spasms.   diazepam 2 MG tablet Commonly known as: VALIUM Take 2 mg by mouth 2 (two) times daily as needed for anxiety.   enoxaparin 80 MG/0.8ML injection Commonly known as: LOVENOX Inject 0.8 mLs (80 mg total) into the skin every 12 (twelve) hours. Start taking on: October 13, 2023 What changed:  medication strength how much to take    famotidine 20 MG tablet Commonly known as: PEPCID Take 20 mg by mouth 2 (two) times daily.   fenofibrate 145 MG tablet Commonly known as: TRICOR Take 145 mg by mouth daily.   fentaNYL 50 MCG/HR Commonly known as: DURAGESIC Place 1 patch onto the skin every 3 (three) days. Start taking on: October 13, 2023   FreeStyle Libre 3 Sensor Misc Apply 1 sensor every 14 (fourteen) days.   gabapentin 300 MG capsule Commonly known as: NEURONTIN Take 1 capsule (300 mg total) by mouth 3 (three) times daily. Start taking on: October 13, 2023   HYDROmorphone 2 MG tablet Commonly known as: DILAUDID Take 1 tablet (2 mg total) by mouth every 6 (six) hours as needed for severe pain (pain score 7-10).   linaclotide 290 MCG Caps capsule Commonly known as: Linzess Take 1 capsule (290 mcg total) by mouth daily before breakfast.   linezolid 600 MG tablet Commonly known as: ZYVOX Take 1 tablet (600 mg total) by mouth every 12 (twelve) hours for 2 days.   metFORMIN 1000 MG tablet Commonly known as: GLUCOPHAGE Take 1,000 mg by mouth 2 (two) times daily with a meal.   metoprolol tartrate 25 MG tablet Commonly known as: LOPRESSOR Take 1 tablet (25 mg total) by mouth 2 (two) times daily.   nitroGLYCERIN 0.4 MG SL tablet Commonly known as: NITROSTAT Place 1 tablet (0.4 mg total) under the tongue every 5 (five) minutes as needed for chest pain.   NovoLOG FlexPen 100 UNIT/ML FlexPen Generic drug: insulin aspart Inject 3 Units into the skin 3 (three) times daily with meals. What changed:  how much to take when to take this additional instructions   ondansetron 8 MG tablet Commonly known as: ZOFRAN Take 1 tablet (8 mg total) by mouth every 8 (eight) hours as needed for nausea or vomiting. TAKE 1 TAB BY MOUTH EVERY 8 HOURS AS NEEDED FOR NAUSEA OR VOMITING. START ON THE THIRD DAY AFTER CHEMOTHERAPY. What changed: See the new instructions.   oxybutynin 5 MG 24 hr tablet Commonly known as:  DITROPAN-XL Take 1 tablet (5 mg total) by mouth at bedtime.   pantoprazole 40 MG tablet Commonly known as: PROTONIX Take 1 tablet (40 mg total) by mouth daily. Start taking on: October 13, 2023   polyethylene glycol 17 g packet Commonly known as: MIRALAX / GLYCOLAX Take 17 g by mouth daily as needed.   prochlorperazine 10 MG tablet Commonly known as: COMPAZINE Take 1 tablet (10 mg total) by mouth every 6 (six) hours as needed for nausea or vomiting (Zofran resumes 12/16).   QUEtiapine 400 MG tablet Commonly known as: SEROQUEL Take 800 mg by mouth at bedtime.   rosuvastatin 40 MG tablet Commonly known as: CRESTOR Take 1 tablet (40 mg total) by mouth daily.   senna 8.6 MG Tabs tablet Commonly known as: SENOKOT Take 1 tablet (8.6 mg total) by mouth 2 (two) times daily.   Evaristo Bury FlexTouch 200 UNIT/ML FlexTouch Pen Generic drug: insulin degludec Inject 26 Units into the skin at bedtime.        Discharge Exam: [employer]  10/05/23 0938 10/05/23 1640  Weight: 87.1 kg 84.5 kg   Appear in no acute ditress  Condition at discharge: stable  The results of significant diagnostics from this hospitalization (including imaging, microbiology, ancillary and laboratory) are listed below for reference.   Imaging Studies: CT CHEST ABDOMEN PELVIS W CONTRAST Result Date: 10/06/2023 CLINICAL DATA:  Metastatic breast cancer with recent left breast recurrence. Assess treatment response. * Tracking Code: BO * EXAM: CT CHEST, ABDOMEN, AND PELVIS WITH CONTRAST TECHNIQUE: Multidetector CT imaging of the chest, abdomen and pelvis was performed following the standard protocol during bolus administration of intravenous contrast. RADIATION DOSE REDUCTION: This exam was performed according to the departmental dose-optimization program which includes automated exposure control, adjustment of the mA and/or kV according to patient size and/or use of iterative reconstruction technique. CONTRAST:   OMNIPAQUE IOHEXOL 300 MG/ML  SOLN COMPARISON:  CTA of the chest, abdomen and pelvis 08/20/2020. PET-CT 08/04/2023 and 03/04/2023. Ultrasound 06/28/2023. FINDINGS: CT CHEST FINDINGS Cardiovascular: Right IJ Port-A-Cath extends to the lower SVC level. No acute vascular findings are demonstrated. There is mild atherosclerosis of the aorta, great vessels and coronary arteries. The heart size is normal. There is no pericardial effusion. Mediastinum/Nodes: There are no enlarged mediastinal, hilar, axillary or internal mammary lymph nodes. There are apparent surgical changes in the left supraclavicular area with irregular subcutaneous nodularity, similar to most recent PET-CT and not hypermetabolic, although not seen on the 03/04/2023 PET-CT. Low-density left supraclavicular nodule measuring 2.0 x 1.2 cm on image 5/2 is grossly unchanged from previous PET-CT as well, not hypermetabolic. The thyroid gland, trachea and esophagus demonstrate no significant findings. Lungs/Pleura: No pleural effusion or pneumothorax. There are stable subpleural radiation changes anteriorly in the left upper lobe. Mild central airway thickening noted. No focal airspace disease or suspicious pulmonary nodularity. Musculoskeletal/Chest wall: Grossly stable postsurgical changes in the left breast with diffuse dermal and trabecular thickening, similar to recent PET-CT. New soft tissue emphysema laterally in the left breast attributed to recent biopsy. No other chest wall mass or suspicious osseous finding. CT ABDOMEN AND PELVIS FINDINGS Hepatobiliary: The liver is normal in density without suspicious focal abnormality. No significant biliary dilatation status post cholecystectomy. Pancreas: Generalized atrophy. No focal mass lesion, ductal dilatation or surrounding inflammation. Spleen: Normal in size without focal abnormality. Adrenals/Urinary Tract: Both adrenal glands appear normal. No evidence of urinary tract calculus, suspicious renal  lesion or hydronephrosis. Mild renal cortical scarring bilaterally. No bladder abnormality identified. Stomach/Bowel: No enteric contrast administered. The stomach appears unremarkable for its degree of distension. No evidence of bowel wall thickening, distention or surrounding inflammatory change. The appendix appears normal. There is prominent stool throughout the colon. Vascular/Lymphatic: There are no enlarged abdominal or pelvic lymph nodes. Diffuse aortic and branch vessel atherosclerosis without evidence of aneurysm or large vessel occlusion. There is mild diffuse common iliac artery luminal narrowing bilaterally. Reproductive: The uterus and ovaries appear unremarkable. No adnexal mass. Other: Small periumbilical hernia containing only fat. No ascites or peritoneal nodularity. No pneumoperitoneum. Musculoskeletal: No acute or significant osseous findings. IMPRESSION: 1. Possible postsurgical changes in the left supraclavicular area, similar to PET-CT of 2 months ago but new from 03/04/2023. There was no significant hypermetabolic activity in these areas at recent PET-CT, although metastatic disease is a concern, especially if there has been no prior surgery in this area. The patient did present for ultrasound with a palpable abnormality in this region 3 months ago. Consider dedicated CT of the neck or follow up PET-CT.  2. Postsurgical changes in the left breast, grossly stable. 3. No evidence of distant metastatic disease in the chest, abdomen or pelvis. 4. Prominent stool throughout the colon suggesting constipation. 5.  Aortic Atherosclerosis (ICD10-I70.0). Electronically Signed   By: Carey Bullocks M.D.   On: 10/06/2023 12:40   IR PATIENT EVAL TECH 0-60 MINS Result Date: 10/06/2023 Barrett Shell, RT     10/06/2023  9:13 AM Patient was seen today in IR for portacath evaluation due to port not getting blood return.  Portacath was accessed and flushed well.  No blood return was noted, at this time, a  power flush of the portacath was performed. After power flushing portacath, blood return was noted.  Port was then flushed again and blood return noted again.  At this time, portacath was left accessed, and a new, clean dry dressing was placed over access needle.  DG Chest 2 View Result Date: 10/05/2023 CLINICAL DATA:  facial swelling, neck swelling, IJV thrombosis, known breast cancer, chest mass?Marland Kitchen EXAM: CHEST - 2 VIEW COMPARISON:  06/12/2023. FINDINGS: Bilateral lung fields are clear. Bilateral costophrenic angles are clear. Normal cardio-mediastinal silhouette. No acute osseous abnormalities. The soft tissues are within normal limits. Right-sided CT Port-A-Cath is again seen with its tip overlying the cavoatrial junction region. IMPRESSION: No active cardiopulmonary disease. Electronically Signed   By: Jules Schick M.D.   On: 10/05/2023 15:14   MR Neck Soft Tissue Only W or Wo Contrast Result Date: 10/05/2023 CLINICAL DATA:  Neck mass, nonpulsatile.  Recurrent breast cancer. EXAM: MRI OF THE NECK WITH CONTRAST TECHNIQUE: Multiplanar, multisequence MR imaging was performed following the administration of intravenous contrast. CONTRAST:  10mL GADAVIST GADOBUTROL 1 MMOL/ML IV SOLN COMPARISON:  Neck CT 09/29/2023. FINDINGS: Pharynx and larynx: No intraluminal mass or submucosal edema. Edema/fluid in the left parapharyngeal and retropharyngeal spaces, likely secondary to venous and/or lymphatic obstruction. Salivary glands: Asymmetric edema and hyperenhancement of the left parotid gland, likely secondary to venous and/or lymphatic obstruction. Thyroid: Normal. Lymph nodes: Centrally necrotic left level 4 lymph node measures up to 21 x 17 mm (axial image 44 series 13). Ill-defined soft tissue signal and enhancement extending along the entirety of left level 5 may represent metastatic disease in the lymph nodes with extracapsular extension. Thickening and enhancement of the left sternoclavicular muscle likely  reflects direct invasion with tumor. Confluent enhancement along the left carotid sheath throughout the left level 2, 3 and 4 stations is favored to reflect nodal metastatic disease with extracapsular extension. Vascular: Obstruction of the left internal jugular vein at the level of the necrotic left level 4 lymph node with bland thrombus extending cranially to the level of the larynx (axial image 41 series 11 and 13). Limited intracranial: Unremarkable. Visualized orbits: Periorbital edema in the left, likely secondary to venous and/or lymphatic obstruction. Mastoids and visualized paranasal sinuses: Well aerated. Skeleton: Edema in the right-greater-than-left dorsal paraspinal muscles along the lower cervical spine is favored to reflect muscle strain. Upper chest: Unremarkable. Other: None. IMPRESSION: 1. Centrally necrotic left level 4 lymph node measures up to 21 x 17 mm, consistent with nodal metastatic disease. Ill-defined soft tissue signal and enhancement extending along the entirety of left level 5 may represent metastatic disease in the lymph nodes with extracapsular extension. Thickening and enhancement of the left sternoclavicular muscle likely reflects direct invasion with tumor. Confluent enhancement along the left carotid sheath throughout the left level 2, 3 and 4 stations is favored to reflect nodal metastatic disease with extracapsular extension.  2. Obstruction of the left internal jugular vein at the level of the necrotic left level 4 lymph node with bland thrombus extending cranially to the level of the larynx. 3. Edema/fluid in the superficial left face/neck, as well as the left parapharyngeal and retropharyngeal spaces, likely secondary to venous and/or lymphatic obstruction. 4. Edema in the right-greater-than-left dorsal paraspinal muscles along the lower cervical spine is favored to reflect muscle strain. Electronically Signed   By: Orvan Falconer M.D.   On: 10/05/2023 13:39   MR Brain W  and Wo Contrast Result Date: 10/05/2023 CLINICAL DATA:  Brain/CNS neoplasm, staging. Recurrent breast cancer. EXAM: MRI HEAD WITHOUT AND WITH CONTRAST TECHNIQUE: Multiplanar, multiecho pulse sequences of the brain and surrounding structures were obtained without and with intravenous contrast. CONTRAST:  10mL GADAVIST GADOBUTROL 1 MMOL/ML IV SOLN COMPARISON:  Brain MRI 09/03/2023. FINDINGS: Brain: Unchanged 7 mm dural-based enhancing lesion along the left middle frontal gyrus, again favored to reflect a small meningioma. No new foci of abnormal intracranial enhancement. No acute infarct or hemorrhage. No hydrocephalus or extra-axial collection. Vascular: Normal flow voids and vessel enhancement. Skull and upper cervical spine: Normal marrow signal and enhancement. Sinuses/Orbits: No acute findings. Other: None. IMPRESSION: 1. No evidence of intracranial metastatic disease. 2. Unchanged 7 mm dural-based enhancing lesion along the left middle frontal gyrus, again favored to reflect a small meningioma. Electronically Signed   By: Orvan Falconer M.D.   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 This was found to be concordant by Dr. Laveda Abbe. Pathology revealed GRADE III INVASIVE DUCTAL CARCINOMA of the LEFT breast, upper outer, anterior, (cylinder clip). This was found to be concordant by Dr. Laveda Abbe. Pathology results were discussed with the patient by telephone by myself and in person by Clent Jacks, PA-C with [facility], [facility] location. The patient reported doing well after the biopsies with tenderness at the sites. Post biopsy instructions and care were reviewed and questions were answered. The patient was encouraged to call [facility] for any additional concerns. My direct phone number was provided. 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 Pathology results reported by Rene Kocher, RN on 10/05/2023. Electronically Signed   By: Harmon Pier M.D.   On: 10/05/2023 08:06   Result Date: 10/05/2023 CLINICAL DATA:  57 year old female presents for tissue sampling of 2 sites of UPPER OUTER LEFT breast non masslike enhancement. History of LEFT breast cancer and treatment. EXAM: MRI GUIDED CORE NEEDLE BIOPSY OF THE LEFT BREAST X 2 TECHNIQUE: Multiplanar, multisequence MR imaging of the LEFT breast was performed both before and after administration of intravenous contrast. CONTRAST:  9 cc intravenous Vueway COMPARISON:  Previous exam(s). FINDINGS: I met with the patient, and we discussed the procedure of MRI guided biopsy, including risks, benefits, and alternatives. Specifically, we discussed the risks of infection, bleeding, tissue injury, clip migration, and inadequate sampling. Informed, written consent was given. The usual time out protocol was performed immediately prior to the procedure. MRI GUIDED CORE NEEDLE BIOPSY OF THE LEFT BREAST #1 (posterior aspect of UPPER-OUTER LEFT breast non masslike enhancement-BARBELL clip): Using sterile technique, 1% Lidocaine with and without  epinephrine, MRI guidance, and a 9 gauge vacuum assisted device, biopsy was performed of the posterior aspect of UPPER-OUTER LEFT breast non masslike enhancement using a LATERAL approach. At the conclusion of the procedure, a BARBELL tissue marker clip was deployed into the biopsy cavity. Follow-up 2-view mammogram was performed and dictated separately. MRI GUIDED CORE NEEDLE BIOPSY OF THE LEFT BREAST #2 (anterior aspect of UPPER-OUTER LEFT breast non masslike enhancement-CYLINDER clip): Using sterile technique, 1% Lidocaine with  and without epinephrine, MRI guidance, and a 9 gauge vacuum assisted device, biopsy was performed of the anterior aspect of UPPER-OUTER LEFT breast non masslike enhancement using a LATERAL approach. At the conclusion of the procedure, a CYLINDER tissue marker clip was deployed into the biopsy cavity. Follow-up 2-view mammogram was performed and dictated separately. IMPRESSION: MRI guided biopsy of the posterior aspect of UPPER-OUTER LEFT breast non masslike enhancement (BARBELL clip). MR guided biopsy of the anterior aspect of UPPER-OUTER LEFT breast non masslike enhancement (CYLINDER clip). No apparent complications. Electronically Signed: By: Harmon Pier M.D. 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 This was found to be concordant by Dr. Laveda Abbe. Pathology revealed GRADE III INVASIVE DUCTAL CARCINOMA of the LEFT breast, upper outer, anterior, (cylinder clip). This was found to be concordant by Dr. Laveda Abbe. Pathology results were discussed with the patient by telephone by myself and in person by Clent Jacks, PA-C with [facility], [facility] location. The patient reported doing well after the biopsies with tenderness at the sites. Post biopsy instructions and care were reviewed and questions were answered. The patient was encouraged to call [facility] for any additional concerns. My direct phone number was provided. 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 Pathology results reported by Rene Kocher, RN on 10/05/2023. Electronically Signed   By: Harmon Pier M.D.   On: 10/05/2023 08:06   Result Date:  10/05/2023 CLINICAL DATA:  57 year old female presents for tissue sampling of 2 sites of UPPER OUTER LEFT breast non masslike enhancement. History of LEFT breast cancer and treatment. EXAM: MRI GUIDED CORE NEEDLE BIOPSY OF THE LEFT BREAST X 2 TECHNIQUE: Multiplanar, multisequence MR imaging of the LEFT breast was performed both before and after administration of intravenous contrast. CONTRAST:  9 cc intravenous Vueway COMPARISON:  Previous exam(s). FINDINGS: I met with the patient, and we discussed the procedure of MRI guided biopsy, including risks, benefits, and alternatives. Specifically, we discussed the risks of infection, bleeding, tissue injury, clip migration, and inadequate sampling. Informed, written consent was given. The usual time out protocol was performed immediately prior to the procedure. MRI GUIDED CORE NEEDLE BIOPSY OF THE LEFT BREAST #1 (posterior aspect of UPPER-OUTER LEFT breast non masslike enhancement-BARBELL clip): Using sterile technique, 1% Lidocaine with and without epinephrine, MRI guidance, and a 9 gauge vacuum assisted device, biopsy was performed of the posterior aspect of UPPER-OUTER LEFT breast non masslike enhancement using a LATERAL approach. At the conclusion of the procedure, a BARBELL tissue marker clip was deployed into the biopsy cavity. Follow-up 2-view mammogram was performed and dictated separately. MRI GUIDED CORE NEEDLE BIOPSY OF THE LEFT BREAST #2 (anterior aspect of UPPER-OUTER LEFT breast  non masslike enhancement-CYLINDER clip): Using sterile technique, 1% Lidocaine with and without epinephrine, MRI guidance, and a 9 gauge vacuum assisted device, biopsy was performed of the anterior aspect of UPPER-OUTER LEFT breast non masslike enhancement using a LATERAL approach. At the conclusion of the procedure, a CYLINDER tissue marker clip was deployed into the biopsy cavity. Follow-up 2-view mammogram was performed and dictated separately. IMPRESSION: MRI guided biopsy of  the posterior aspect of UPPER-OUTER LEFT breast non masslike enhancement (BARBELL clip). MR guided biopsy of the anterior aspect of UPPER-OUTER LEFT breast non masslike enhancement (CYLINDER clip). No apparent complications. Electronically Signed: By: Harmon Pier M.D. On: 10/03/2023 10:45   MM CLIP PLACEMENT LEFT Result Date: 10/03/2023 CLINICAL DATA:  Evaluate placement of biopsy clips following 2 site MR guided LEFT breast biopsies. EXAM: 3D DIAGNOSTIC LEFT MAMMOGRAM POST MRI BIOPSY COMPARISON:  Previous exam(s). FINDINGS: 3D Mammographic images were obtained following MR guided biopsy of the posterior aspect (BARBELL clip) and anterior aspect (CYLINDER clip) of UPPER-OUTER LEFT breast non masslike enhancement. The BARBELL biopsy marking clip is in expected position at the site of biopsy. The CYLINDER biopsy marking clip is in expected position at the site of biopsy. IMPRESSION: Appropriate positioning of the BARBELL shaped biopsy marking clip at the site of biopsy in the UPPER OUTER LEFT breast. Appropriate positioning of the CYLINDER shaped biopsy marking clip at the site of biopsy in the UPPER OUTER LEFT breast. Final Assessment: Post Procedure Mammograms for Marker Placement Electronically Signed   By: Harmon Pier M.D.   On: 10/03/2023 10:58   CT Soft Tissue Neck W Contrast Result Date: 09/29/2023 CLINICAL DATA:  Left parotid region swelling over the last 2 months. History of breast cancer. EXAM: CT NECK WITH CONTRAST TECHNIQUE: Multidetector CT imaging of the neck was performed using the standard protocol following the bolus administration of intravenous contrast. RADIATION DOSE REDUCTION: This exam was performed according to the departmental dose-optimization program which includes automated exposure control, adjustment of the mA and/or kV according to patient size and/or use of iterative reconstruction technique. CONTRAST:  OMNIPAQUE IOHEXOL 300 MG/ML  SOLN COMPARISON:  Ultrasound 06/28/2023.  PET scan 08/04/2023. PET scan 03/04/2023. FINDINGS: Pharynx and larynx: No mucosal or submucosal lesion. Salivary glands: Right parotid gland is normal. Both submandibular glands are normal. There is edematous change throughout the left parotid gland. Question if there is a newly seen 13 mm nodular density at the tip of the superficial lobe. See below. Thyroid: Normal Lymph nodes: No lymphadenopathy in the right neck. On the left, there is increasing density in the supraclavicular region which appears dense/enhancing and irregular. This is worrisome for local recurrence of malignancy. As discussed below, there is thrombosis of the left jugular vein which is associated with edema of the left face and neck, which makes other detail difficult. Axial image 31 shows a malignant appearing level 2 nodal mass measuring up to 2 cm. There are probably other malignant nodes in the level 2 to level 3 region but these are difficult to separate out from the regional inflammatory changes. The left sternocleidomastoid muscle is also swollen and shows areas of heterogeneous density. Direct tumor invasion/involvement of the muscle is possible. Axial image 57 shows a 2 cm low-density lesion which could be a necrotic mass or could relate in some way to the thrombosed jugular vein. Vascular: Arterial structures are patent. As noted above, there is thrombosis of the left jugular vein. I think this is probably secondary to regional progressive tumor.  Considerable regional edema associated without venous thrombosis. Limited intracranial: Normal Visualized orbits: Normal except for superficial swelling of the eyelids on the left. Mastoids and visualized paranasal sinuses: Mild mucosal thickening of the right maxillary sinus. Other sinuses are clear. Mastoids are clear. Skeleton: No evidence of regional bony metastatic disease. Upper chest: Lung apices are clear except for post radiation scarring. There is a small amount of pericardial fluid  noted. Small superior mediastinal nodes are present, nonspecific. Other: None IMPRESSION: 1. Increasing density in the left supraclavicular region which appears dense/enhancing and irregular. This is worrisome for local recurrence of malignancy. There is thrombosis of the left jugular vein which is associated with edema of the left face and neck, which makes other detail difficult. There is a malignant appearing level 2 nodal mass measuring up to 2 cm. There are probably other malignant nodes in the level 2 to level 3 region but these are difficult to separate out from the regional inflammatory changes. The left sternocleidomastoid muscle is also swollen and shows areas of heterogeneous density. Direct tumor invasion/involvement of the muscle is possible. Enhancing nodule within the inferior aspect of the left parotid gland could be a malignant intraparotid node. 2. Small amount of pericardial fluid. Small superior mediastinal nodes, nonspecific. 3. MRI of the neck with contrast could be useful in determining all of the locations of potential malignancy as well as further understanding the degree and nature of the left jugular thrombosis. Electronically Signed   By: Paulina Fusi M.D.   On: 09/29/2023 16:09    Microbiology: Results for orders placed or performed in visit on 02/15/22  Urine Culture     Status: None   Collection Time: 02/15/22 11:55 AM   Specimen: Urine, Clean Catch  Result Value Ref Range Status   Specimen Description   Final    URINE, CLEAN CATCH Performed at [facility] at [facility], [address] [place], [place] [address]    Special Requests NONE  Final   Culture   Final    NO GROWTH Performed at [facility], [address], [place], [place] [address]    Report Status 02/16/2022 FINAL  Final    Labs: CBC: Recent Labs  Lab 10/07/23 0611 10/08/23 0748 10/09/23 0639 10/10/23 0545 10/11/23 0602  WBC 9.0 11.4* 7.8 8.2 5.7  NEUTROABS   --   --  6.4  --   --   HGB 11.3* 12.1 11.6* 11.2* 11.0*  HCT 34.7* 36.4 34.4* 32.1* 32.6*  MCV 92.0 94.3 92.2 89.9 91.6  PLT 365 337 302 288 287   Basic Metabolic Panel: Recent Labs  Lab 10/06/23 0035 10/07/23 0611 10/08/23 0748 10/08/23 0857 10/09/23 0639 10/12/23 0603  NA 133* 135 132*  --  128* 131*  K 4.1 3.5 3.8  --  3.9 4.1  CL 101 97* 100  --  95* 95*  CO2 22 26 24   --  23 27  GLUCOSE 240* 270* 422* 449* 287* 279*  BUN 11 16 18   --  22* 26*  CREATININE 0.57 0.69 0.70  --  0.69 0.83  CALCIUM 8.6* 9.3 8.9  --  8.9 9.1   Liver Function Tests: Recent Labs  Lab 10/06/23 0035 10/07/23 0611 10/08/23 0748 10/09/23 0639 10/12/23 0603  AST 12* 13* 13* 37 17  ALT 13 13 15 24 28   ALKPHOS 87 78 73 57 51  BILITOT 0.5 0.4 0.4 0.5 0.4  PROT 6.4* 6.9 6.3* 5.9*  6.1*  ALBUMIN 3.3* 3.5 3.3* 2.9* 3.1*   CBG: Recent Labs  Lab 10/11/23 1634 10/11/23 2037 10/12/23 0808 10/12/23 1213 10/12/23 1636  GLUCAP 183* 336* 243* 297* 255*    Discharge time spent: greater than 30 minutes.  Signed: Meredeth Ide, MD [facility] 10/12/2023

## 2023-10-12 NOTE — Progress Notes (Signed)
   10/12/23 1606  TOC Brief Assessment  Insurance and Status Reviewed  Patient has primary care physician Yes Ok Edwards, Lillia Abed, PA-C)  Home environment has been reviewed Home (home with spouse)  Prior level of function: Independent  Prior/Current Home Services No current home services  Social Drivers of Health Review SDOH reviewed needs interventions (food insecuruties will provide resources and document)  Readmission risk has been reviewed Yes  Transition of care needs transition of care needs identified, TOC will continue to follow (food insecurities will provice resources and document)

## 2023-10-13 ENCOUNTER — Other Ambulatory Visit (HOSPITAL_BASED_OUTPATIENT_CLINIC_OR_DEPARTMENT_OTHER): Payer: Self-pay

## 2023-10-13 ENCOUNTER — Ambulatory Visit
Admission: RE | Admit: 2023-10-13 | Discharge: 2023-10-13 | Disposition: A | Discharge status: Home / Self Care | Payer: Medicaid Other | Source: Ambulatory Visit | Attending: Radiation Oncology | Admitting: Radiation Oncology

## 2023-10-13 ENCOUNTER — Other Ambulatory Visit: Payer: Self-pay

## 2023-10-13 ENCOUNTER — Encounter (HOSPITAL_COMMUNITY): Payer: Self-pay | Admitting: Hematology & Oncology

## 2023-10-13 DIAGNOSIS — Z171 Estrogen receptor negative status [ER-]: Secondary | ICD-10-CM | POA: Diagnosis not present

## 2023-10-13 DIAGNOSIS — Z510 Encounter for antineoplastic radiation therapy: Secondary | ICD-10-CM | POA: Diagnosis present

## 2023-10-13 DIAGNOSIS — F17200 Nicotine dependence, unspecified, uncomplicated: Secondary | ICD-10-CM | POA: Insufficient documentation

## 2023-10-13 DIAGNOSIS — C50912 Malignant neoplasm of unspecified site of left female breast: Secondary | ICD-10-CM | POA: Insufficient documentation

## 2023-10-13 DIAGNOSIS — R978 Other abnormal tumor markers: Secondary | ICD-10-CM | POA: Diagnosis present

## 2023-10-13 DIAGNOSIS — I82C12 Acute embolism and thrombosis of left internal jugular vein: Secondary | ICD-10-CM | POA: Diagnosis not present

## 2023-10-13 DIAGNOSIS — C7989 Secondary malignant neoplasm of other specified sites: Secondary | ICD-10-CM | POA: Diagnosis not present

## 2023-10-13 LAB — RAD ONC ARIA SESSION SUMMARY
Course Elapsed Days: 6
Plan Fractions Treated to Date: 5
Plan Prescribed Dose Per Fraction: 2 Gy
Plan Total Fractions Prescribed: 25
Plan Total Prescribed Dose: 50 Gy
Reference Point Dosage Given to Date: 10 Gy
Reference Point Session Dosage Given: 2 Gy
Session Number: 5

## 2023-10-13 NOTE — Progress Notes (Signed)
Patient discharged home. Discharge papers reviewed and given to patient. Port access removed by IV team, patient tolerated well. Antibiotic given to patient prior to discharge, vital signs taken, belongings gathered and taken by patient. Patient left via wheelchair, escorted by staff. Patient tolerated well.

## 2023-10-14 ENCOUNTER — Other Ambulatory Visit: Payer: Self-pay

## 2023-10-14 ENCOUNTER — Inpatient Hospital Stay
Admission: RE | Admit: 2023-10-14 | Discharge: 2023-10-14 | Disposition: A | Discharge status: Home / Self Care | Payer: Self-pay | Source: Ambulatory Visit | Attending: Radiation Oncology | Admitting: Radiation Oncology

## 2023-10-14 ENCOUNTER — Ambulatory Visit
Admission: RE | Admit: 2023-10-14 | Discharge: 2023-10-14 | Disposition: A | Discharge status: Home / Self Care | Payer: Medicaid Other | Source: Ambulatory Visit | Attending: Radiation Oncology | Admitting: Radiation Oncology

## 2023-10-14 ENCOUNTER — Other Ambulatory Visit (HOSPITAL_BASED_OUTPATIENT_CLINIC_OR_DEPARTMENT_OTHER): Payer: Self-pay

## 2023-10-14 ENCOUNTER — Other Ambulatory Visit: Payer: Self-pay | Admitting: Radiation Oncology

## 2023-10-14 DIAGNOSIS — Z510 Encounter for antineoplastic radiation therapy: Secondary | ICD-10-CM | POA: Diagnosis not present

## 2023-10-14 LAB — RAD ONC ARIA SESSION SUMMARY
Course Elapsed Days: 7
Plan Fractions Treated to Date: 6
Plan Prescribed Dose Per Fraction: 2 Gy
Plan Total Fractions Prescribed: 25
Plan Total Prescribed Dose: 50 Gy
Reference Point Dosage Given to Date: 12 Gy
Reference Point Session Dosage Given: 2 Gy
Session Number: 6

## 2023-10-14 MED ORDER — CLOTRIMAZOLE 10 MG MT TROC
10.0000 mg | Freq: Every day | OROMUCOSAL | 0 refills | Status: DC
  Filled 2023-10-14: qty 150, 30d supply, fill #0

## 2023-10-17 ENCOUNTER — Other Ambulatory Visit: Payer: Self-pay

## 2023-10-17 ENCOUNTER — Ambulatory Visit
Admission: RE | Admit: 2023-10-17 | Discharge: 2023-10-17 | Disposition: A | Discharge status: Home / Self Care | Payer: Medicaid Other | Source: Ambulatory Visit | Attending: Radiation Oncology | Admitting: Radiation Oncology

## 2023-10-17 DIAGNOSIS — Z510 Encounter for antineoplastic radiation therapy: Secondary | ICD-10-CM | POA: Diagnosis not present

## 2023-10-17 LAB — RAD ONC ARIA SESSION SUMMARY
Course Elapsed Days: 10
Plan Fractions Treated to Date: 7
Plan Prescribed Dose Per Fraction: 2 Gy
Plan Total Fractions Prescribed: 25
Plan Total Prescribed Dose: 50 Gy
Reference Point Dosage Given to Date: 14 Gy
Reference Point Session Dosage Given: 2 Gy
Session Number: 7

## 2023-10-18 ENCOUNTER — Inpatient Hospital Stay: Payer: Medicaid Other | Admitting: Hematology & Oncology

## 2023-10-18 ENCOUNTER — Inpatient Hospital Stay: Payer: Medicaid Other

## 2023-10-18 ENCOUNTER — Ambulatory Visit
Admission: RE | Admit: 2023-10-18 | Discharge: 2023-10-18 | Disposition: A | Discharge status: Home / Self Care | Payer: Medicaid Other | Source: Ambulatory Visit | Attending: Radiation Oncology | Admitting: Radiation Oncology

## 2023-10-18 ENCOUNTER — Other Ambulatory Visit: Payer: Self-pay

## 2023-10-18 DIAGNOSIS — Z510 Encounter for antineoplastic radiation therapy: Secondary | ICD-10-CM | POA: Diagnosis not present

## 2023-10-18 LAB — RAD ONC ARIA SESSION SUMMARY
Course Elapsed Days: 11
Plan Fractions Treated to Date: 8
Plan Prescribed Dose Per Fraction: 2 Gy
Plan Total Fractions Prescribed: 25
Plan Total Prescribed Dose: 50 Gy
Reference Point Dosage Given to Date: 16 Gy
Reference Point Session Dosage Given: 2 Gy
Session Number: 8

## 2023-10-20 ENCOUNTER — Other Ambulatory Visit: Payer: Self-pay

## 2023-10-20 ENCOUNTER — Inpatient Hospital Stay: Payer: Medicaid Other | Admitting: Hematology & Oncology

## 2023-10-20 ENCOUNTER — Other Ambulatory Visit (HOSPITAL_BASED_OUTPATIENT_CLINIC_OR_DEPARTMENT_OTHER): Payer: Self-pay

## 2023-10-20 ENCOUNTER — Encounter: Payer: Self-pay | Admitting: Hematology & Oncology

## 2023-10-20 ENCOUNTER — Encounter: Payer: Self-pay | Admitting: *Deleted

## 2023-10-20 ENCOUNTER — Telehealth: Payer: Self-pay

## 2023-10-20 ENCOUNTER — Ambulatory Visit
Admission: RE | Admit: 2023-10-20 | Discharge: 2023-10-20 | Disposition: A | Discharge status: Home / Self Care | Payer: Medicaid Other | Source: Ambulatory Visit | Attending: Radiation Oncology | Admitting: Radiation Oncology

## 2023-10-20 ENCOUNTER — Inpatient Hospital Stay: Payer: Medicaid Other

## 2023-10-20 VITALS — BP 153/67 | HR 115 | Temp 98.8°F | Resp 20 | Ht 62.0 in | Wt 183.1 lb

## 2023-10-20 DIAGNOSIS — C50012 Malignant neoplasm of nipple and areola, left female breast: Secondary | ICD-10-CM

## 2023-10-20 DIAGNOSIS — C50011 Malignant neoplasm of nipple and areola, right female breast: Secondary | ICD-10-CM | POA: Diagnosis not present

## 2023-10-20 DIAGNOSIS — Z86718 Personal history of other venous thrombosis and embolism: Secondary | ICD-10-CM | POA: Diagnosis not present

## 2023-10-20 DIAGNOSIS — I82C12 Acute embolism and thrombosis of left internal jugular vein: Secondary | ICD-10-CM

## 2023-10-20 DIAGNOSIS — Z510 Encounter for antineoplastic radiation therapy: Secondary | ICD-10-CM | POA: Diagnosis not present

## 2023-10-20 LAB — RAD ONC ARIA SESSION SUMMARY
Course Elapsed Days: 13
Plan Fractions Treated to Date: 9
Plan Prescribed Dose Per Fraction: 2 Gy
Plan Total Fractions Prescribed: 25
Plan Total Prescribed Dose: 50 Gy
Reference Point Dosage Given to Date: 18 Gy
Reference Point Session Dosage Given: 2 Gy
Session Number: 9

## 2023-10-20 LAB — CBC WITH DIFFERENTIAL (CANCER CENTER ONLY)
Abs Immature Granulocytes: 0.01 10*3/uL (ref 0.00–0.07)
Basophils Absolute: 0 10*3/uL (ref 0.0–0.1)
Basophils Relative: 0 %
Eosinophils Absolute: 0 10*3/uL (ref 0.0–0.5)
Eosinophils Relative: 0 %
HCT: 28.6 % — ABNORMAL LOW (ref 36.0–46.0)
Hemoglobin: 9.8 g/dL — ABNORMAL LOW (ref 12.0–15.0)
Immature Granulocytes: 0 %
Lymphocytes Relative: 22 %
Lymphs Abs: 0.9 10*3/uL (ref 0.7–4.0)
MCH: 31.2 pg (ref 26.0–34.0)
MCHC: 34.3 g/dL (ref 30.0–36.0)
MCV: 91.1 fL (ref 80.0–100.0)
Monocytes Absolute: 0.3 10*3/uL (ref 0.1–1.0)
Monocytes Relative: 6 %
Neutro Abs: 2.8 10*3/uL (ref 1.7–7.7)
Neutrophils Relative %: 72 %
Platelet Count: 75 10*3/uL — ABNORMAL LOW (ref 150–400)
RBC: 3.14 MIL/uL — ABNORMAL LOW (ref 3.87–5.11)
RDW: 14.1 % (ref 11.5–15.5)
WBC Count: 4 10*3/uL (ref 4.0–10.5)
nRBC: 0 % (ref 0.0–0.2)

## 2023-10-20 LAB — CMP (CANCER CENTER ONLY)
ALT: 15 U/L (ref 0–44)
AST: 11 U/L — ABNORMAL LOW (ref 15–41)
Albumin: 4 g/dL (ref 3.5–5.0)
Alkaline Phosphatase: 62 U/L (ref 38–126)
Anion gap: 10 (ref 5–15)
BUN: 11 mg/dL (ref 6–20)
CO2: 26 mmol/L (ref 22–32)
Calcium: 9 mg/dL (ref 8.9–10.3)
Chloride: 100 mmol/L (ref 98–111)
Creatinine: 0.77 mg/dL (ref 0.44–1.00)
GFR, Estimated: >60 mL/min (ref >60)
Glucose, Bld: 253 mg/dL — ABNORMAL HIGH (ref 70–99)
Potassium: 4.2 mmol/L (ref 3.5–5.1)
Sodium: 136 mmol/L (ref 135–145)
Total Bilirubin: 0.4 mg/dL (ref <1.2)
Total Protein: 6.4 g/dL — ABNORMAL LOW (ref 6.5–8.1)

## 2023-10-20 LAB — MAGNESIUM: Magnesium: 1.1 mg/dL — ABNORMAL LOW (ref 1.7–2.4)

## 2023-10-20 LAB — LACTATE DEHYDROGENASE: LDH: 138 U/L (ref 98–192)

## 2023-10-20 MED ORDER — HYDROCODONE-ACETAMINOPHEN 7.5-325 MG PO TABS
1.0000 | ORAL_TABLET | Freq: Four times a day (QID) | ORAL | 0 refills | Status: DC | PRN
  Filled 2023-10-20 – 2023-10-24 (×2): qty 120, 30d supply, fill #0

## 2023-10-20 NOTE — Telephone Encounter (Signed)
Per Dr. Myna Hidalgo pt magnesium is too low and pt needs to come in and have a magnesium infusion. Pt spoke to patient and informed her of her results and aware she needs to come back for an infusion. Pt verbalized understanding and aware of appointment time for 10/21/2023. Order placed under signed and held. Pt had no further concerns.

## 2023-10-20 NOTE — Progress Notes (Signed)
Per Dr Myna Hidalgo, request for [facility] testing sent on specimen [id number] DOS 10/03/2023.  Oncology Nurse Navigator Documentation     10/20/2023    8:30 AM  Oncology Nurse Navigator Flowsheets  Navigator Location [facility]  Navigator Encounter Type Molecular Studies  Interventions Coordination of Care  Coordination of Care Pathology  Time Spent with Patient 30

## 2023-10-20 NOTE — Progress Notes (Signed)
Hematology and Oncology Follow Up Visit  Ariana Herrera [id number] [birth year] 57 y.o. 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  Unfortunately, a lot 7 since I last saw her.  She clearly has had recurrence of the breast cancer.  She actually had a breast biopsy of the left breast.  The pathology report ([id number]) showed that she had the recurrence of her triple negative breast cancer.  The unusual part about all this is that the recurrence has been in her neck.  She has quite a bit of lymphadenopathy in the neck.  She has a thrombus in the left internal jugular vein.Marland Kitchen  She was hospitalized.  She had a lot of facial swelling.  We had started her on radiation and chemotherapy.  She is getting aggressive radiation chemotherapy.  We gave her cisplatin with 5-FU.  She started this on 10/07/2023.  She currently is getting radiotherapy.  She is tolerating this okay.  She does have some odynophagia.  I think she developed some thrush, likely because of her high blood sugars.  She is on Mycelex troches for this.  We do have her on Lovenox 80 mg twice daily for the left internal jugular vein thrombus.  Unfortunately she is still smoking quite a bit.  I told her that smoking will clearly decrease the effectiveness of our treatments for her breast cancer.  She had been on a Duragesic patch and Dilaudid.  She felt this was too strong for her.  She felt that the Norco that she had been on before  was adequate.  She has had no problems with bowels or bladder.  There has been no bleeding.  She has had a little bit of nausea.  She had a little bit of vomiting..  She has had no leg swelling.  Her last CA 27.29 back in November was 9.  Overall, I would have said that her performance status is probably ECOG 1.   Medications:  Allergies as of 10/20/2023       Reactions   Lithium Other (See Comments)   Spinal fluid built up in brain   Dulaglutide Nausea And Vomiting, Other (See Comments)   TRULICITY   Penicillins Other (See Comments)   UNKNOWN CHILDHOOD REACTION        Medication List        Accurate as of October 20, 2023  8:11 AM. If you have any questions, ask your nurse or doctor.          Accu-Chek Guide test strip Generic drug: glucose blood 3 (three) times daily.   albuterol 1.25 MG/3ML nebulizer solution Commonly known as: ACCUNEB Take 1 ampule by nebulization every 6 (six) hours as needed for wheezing or shortness of breath.   albuterol 108 (90 Base) MCG/ACT inhaler Commonly known as: VENTOLIN HFA Inhale 2 puffs into the lungs every 6 (six) hours as needed for wheezing or shortness of breath.   clotrimazole 10 MG troche Commonly known as: MYCELEX Take 1  tablet (10 mg total) by mouth 5 (five) times daily. Slowly dissolve in mouth   cyclobenzaprine 5 MG tablet Commonly known as: FLEXERIL Take 5 mg by mouth 2 (two) times daily as needed for muscle spasms.   diazepam 2 MG tablet Commonly known as: VALIUM Take 2 mg by mouth 2 (two) times daily as needed for anxiety.   enoxaparin 80 MG/0.8ML injection Commonly known as: LOVENOX Inject 0.8 mLs (80 mg total) into the skin every 12 (twelve) hours.   famotidine 20 MG tablet Commonly known as: PEPCID Take 20 mg by mouth 2 (two) times daily.   fenofibrate 145 MG tablet Commonly known as: TRICOR Take 145 mg by mouth daily.   fentaNYL 50 MCG/HR Commonly known as: DURAGESIC Place 1 patch onto the skin  every 3 (three) days.   FreeStyle Libre 3 Sensor Misc Apply 1 sensor every 14 (fourteen) days.   gabapentin 300 MG capsule Commonly known as: NEURONTIN Take 1 capsule (300 mg total) by mouth 3 (three) times daily.   HYDROmorphone 2 MG tablet Commonly known as: DILAUDID Take 1 tablet (2 mg total) by mouth every 6 (six) hours as needed for severe pain (pain score 7-10).   linaclotide 290 MCG Caps capsule Commonly known as: Linzess Take 1 capsule (290 mcg total) by mouth daily before breakfast.   metFORMIN 1000 MG tablet Commonly known as: GLUCOPHAGE Take 1,000 mg by mouth 2 (two) times daily with a meal.   metoprolol tartrate 25 MG tablet Commonly known as: LOPRESSOR Take 1 tablet (25 mg total) by mouth 2 (two) times daily.   nitroGLYCERIN 0.4 MG SL tablet Commonly known as: NITROSTAT Place 1 tablet (0.4 mg total) under the tongue every 5 (five) minutes as needed for chest pain.   NovoLOG FlexPen 100 UNIT/ML FlexPen Generic drug: insulin aspart Inject 3 Units into the skin 3 (three) times daily with meals. What changed:  how much to take when to take this additional instructions   ondansetron 8 MG tablet Commonly known as: ZOFRAN Take 1 tablet (8 mg total) by mouth every 8 (eight) hours as needed for nausea or vomiting. START ON THE THIRD DAY AFTER CHEMOTHERAPY.   oxybutynin 5 MG 24 hr tablet Commonly known as: DITROPAN-XL Take 1 tablet (5 mg total) by mouth at bedtime.   pantoprazole 40 MG tablet Commonly known as: PROTONIX Take 1 tablet (40 mg total) by mouth daily.   PEG 3350 17 g Pack Take 17 g by mouth daily as needed.   prochlorperazine 10 MG tablet Commonly known as: COMPAZINE Take 1 tablet (10 mg total) by mouth every 6 (six) hours as needed for nausea or vomiting (Zofran resumes 12/16).   QUEtiapine 400 MG tablet Commonly known as: SEROQUEL Take 800 mg by mouth at bedtime.   rosuvastatin 40 MG tablet Commonly known as: CRESTOR Take 1 tablet (40 mg  total) by mouth daily.   senna 8.6 MG Tabs tablet Commonly known as: SENOKOT Take 1 tablet (8.6 mg total) by mouth 2 (two) times daily.   Evaristo Bury FlexTouch 200 UNIT/ML FlexTouch Pen Generic drug: insulin degludec Inject 26 Units into the skin at bedtime.        Allergies:  Allergies  Allergen Reactions   Lithium Other (See Comments)    Spinal fluid built up in brain   Dulaglutide Nausea And Vomiting and Other (See Comments)    TRULICITY   Penicillins Other (See Comments)    UNKNOWN CHILDHOOD REACTION    Past Medical History, Surgical  history, Social history, and Family History were reviewed and updated.  Review of Systems: Review of Systems  Constitutional:  Positive for malaise/fatigue.  HENT: Negative.    Eyes: Negative.   Respiratory: Negative.    Cardiovascular:  Positive for leg swelling.  Gastrointestinal:  Positive for nausea.  Genitourinary: Negative.   Musculoskeletal:  Positive for joint pain and myalgias.  Skin: Negative.   Neurological:  Positive for tingling.  Endo/Heme/Allergies: Negative.   Psychiatric/Behavioral: Negative.       Physical Exam:  vitals were not taken for this visit.   Wt Readings from Last 3 Encounters:  10/05/23 186 lb 4.6 oz (84.5 kg)  10/04/23 192 lb (87.1 kg)  10/01/23 182 lb 15.7 oz (83 kg)    Physical Exam Vitals reviewed.  Constitutional:      Comments: On her breast exam, the right breast is without masses, edema or erythema.  There is no right axillary adenopathy.  Her left breast is somewhat contracted from radiation and surgery.  It is somewhat firm.  It is somewhat swollen although still contracted.  It is tender.  I cannot palpate any adenopathy in the left axilla.  There is no left nipple discharge.  HENT:     Head: Normocephalic and atraumatic.     Comments: She does have some swelling of the face.  This is more so on the left side.  She has some firmness on the left side of her neck.  There is some slight  erythema.  I do not see any obvious thrush in the oral cavity. Eyes:     Pupils: Pupils are equal, round, and reactive to light.  Cardiovascular:     Rate and Rhythm: Normal rate and regular rhythm.     Heart sounds: Normal heart sounds.  Pulmonary:     Effort: Pulmonary effort is normal.     Breath sounds: Normal breath sounds.  Abdominal:     General: Bowel sounds are normal.     Palpations: Abdomen is soft.  Musculoskeletal:        General: No tenderness or deformity. Normal range of motion.     Cervical back: Normal range of motion.  Lymphadenopathy:     Cervical: No cervical adenopathy.  Skin:    General: Skin is warm and dry.     Findings: No erythema or rash.  Neurological:     Mental Status: She is alert and oriented to person, place, and time.  Psychiatric:        Behavior: Behavior normal.        Thought Content: Thought content normal.        Judgment: Judgment normal.     Lab Results  Component Value Date   WBC 5.7 10/11/2023   HGB 11.0 (L) 10/11/2023   HCT 32.6 (L) 10/11/2023   MCV 91.6 10/11/2023   PLT 287 10/11/2023   Lab Results  Component Value Date   FERRITIN 33 08/25/2023   IRON 61 08/25/2023   TIBC 400 08/25/2023   UIBC 339 08/25/2023   IRONPCTSAT 15 08/25/2023   Lab Results  Component Value Date   RETICCTPCT 4.0 (H) 08/05/2022   RBC 3.56 (L) 10/11/2023   No results found for: "KPAFRELGTCHN", "LAMBDASER", "KAPLAMBRATIO" No results found for: "IGGSERUM", "IGA", "IGMSERUM" No results found for: "TOTALPROTELP", "ALBUMINELP", "A1GS", "A2GS", "BETS", "BETA2SER", "GAMS", "MSPIKE", "SPEI"   Chemistry      Component Value Date/Time   NA 131 (L) 10/12/2023 0603   K 4.1 10/12/2023 0603  CL 95 (L) 10/12/2023 0603   CO2 27 10/12/2023 0603   BUN 26 (H) 10/12/2023 0603   CREATININE 0.83 10/12/2023 0603   CREATININE 0.76 09/20/2023 0845      Component Value Date/Time   CALCIUM 9.1 10/12/2023 0603   ALKPHOS 51 10/12/2023 0603   AST 17 10/12/2023  0603   AST 40 09/20/2023 0845   ALT 28 10/12/2023 0603   ALT 18 09/20/2023 0845   BILITOT 0.4 10/12/2023 0603   BILITOT 0.3 09/20/2023 0845       Impression and Plan: Ms. Craun is a very pleasant  57 yo caucasian female with recurrent ductal carcinoma of the left breast.  It is amazing that the recurrence is just been in the neck.  When she was in the hospital, we did do scans of the chest/abdomen/pelvis.  These were all negative.  What is also unusual is that she had a PET scan that was done just 2 months ago which did not show any evidence of recurrent disease.  We are treating her aggressively.  She is getting full course of radiation therapy and chemotherapy.  Hopefully, we can get some of the swelling out of her.  Will get some shrinkage in the neck.  Hopefully, she will stop smoking.  This will definitely help her prognosis.  We will start her second cycle of treatment on 10/31/2023.  I will see her back that day.  I told her that she has more problems swallowing, and to please let us know.    [name], MD 12/26/20248:11 AM

## 2023-10-20 NOTE — Telephone Encounter (Signed)
Clinical Social Work was referred by Charity fundraiser for assessment of psychosocial needs.  Patient request gas cards.  CSW attempted to contact patient by phone.  Left voicemail with contact information and request for return call.

## 2023-10-21 ENCOUNTER — Inpatient Hospital Stay: Payer: Medicaid Other

## 2023-10-21 ENCOUNTER — Other Ambulatory Visit: Payer: Self-pay

## 2023-10-21 ENCOUNTER — Ambulatory Visit
Admission: RE | Admit: 2023-10-21 | Discharge: 2023-10-21 | Disposition: A | Discharge status: Home / Self Care | Payer: Medicaid Other | Source: Ambulatory Visit | Attending: Radiation Oncology | Admitting: Radiation Oncology

## 2023-10-21 DIAGNOSIS — Z510 Encounter for antineoplastic radiation therapy: Secondary | ICD-10-CM | POA: Diagnosis not present

## 2023-10-21 DIAGNOSIS — C50011 Malignant neoplasm of nipple and areola, right female breast: Secondary | ICD-10-CM

## 2023-10-21 LAB — CANCER ANTIGEN 27.29: CA 27.29: 77.1 U/mL — ABNORMAL HIGH (ref 0.0–38.6)

## 2023-10-21 LAB — RAD ONC ARIA SESSION SUMMARY
Course Elapsed Days: 14
Plan Fractions Treated to Date: 10
Plan Prescribed Dose Per Fraction: 2 Gy
Plan Total Fractions Prescribed: 25
Plan Total Prescribed Dose: 50 Gy
Reference Point Dosage Given to Date: 20 Gy
Reference Point Session Dosage Given: 2 Gy
Session Number: 10

## 2023-10-21 MED ORDER — SODIUM CHLORIDE 0.9 % IV SOLN
Freq: Once | INTRAVENOUS | Status: AC
Start: 2023-10-21 — End: 2023-10-21

## 2023-10-21 MED ORDER — MAGNESIUM SULFATE 2 GM/50ML IV SOLN
2.0000 g | Freq: Once | INTRAVENOUS | Status: AC
Start: 2023-10-21 — End: 2023-10-21
  Administered 2023-10-21: 2 g via INTRAVENOUS
  Filled 2023-10-21: qty 50

## 2023-10-21 MED ORDER — HEPARIN SOD (PORK) LOCK FLUSH 100 UNIT/ML IV SOLN
500.0000 [IU] | Freq: Once | INTRAVENOUS | Status: AC
  Administered 2023-10-21: 500 [IU] via INTRAVENOUS

## 2023-10-21 MED ORDER — SODIUM CHLORIDE 0.9% FLUSH
10.0000 mL | Freq: Once | INTRAVENOUS | Status: AC
  Administered 2023-10-21: 10 mL via INTRAVENOUS

## 2023-10-21 NOTE — Progress Notes (Signed)
[facility] CSW Progress Note  Clinical Child psychotherapist contacted patient by phone to discuss needs.  She requested assistance with transportation cost.  She stated she did not pursue the [employer] last year because her treatment ended.  She would like to apply for it again.  Provided education regarding process.  CSW to consult superviosr.    Dorothey Baseman, LCSW Clinical Social Worker [facility]    Patient is participating in a Managed Medicaid Plan:  Yes

## 2023-10-21 NOTE — Patient Instructions (Signed)
Magnesium Sulfate Injection What is this medication? MAGNESIUM SULFATE (mag NEE zee um SUL fate) prevents and treats low levels of magnesium in your body. It may also be used to prevent and treat seizures during pregnancy in people with high blood pressure disorders, such as preeclampsia or eclampsia. Magnesium plays an important role in maintaining the health of your muscles and nervous system. This medicine may be used for other purposes; ask your health care provider or pharmacist if you have questions. What should I tell my care team before I take this medication? They need to know if you have any of these conditions: Heart disease History of irregular heart beat Kidney disease An unusual or allergic reaction to magnesium sulfate, medications, foods, dyes, or preservatives Pregnant or trying to get pregnant Breast-feeding How should I use this medication? This medication is for infusion into a vein. It is given in a hospital or clinic setting. Talk to your care team about the use of this medication in children. While this medication may be prescribed for selected conditions, precautions do apply. Overdosage: If you think you have taken too much of this medicine contact a poison control center or emergency room at once. NOTE: This medicine is only for you. Do not share this medicine with others. What if I miss a dose? This does not apply. What may interact with this medication? Certain medications for anxiety or sleep Certain medications for seizures, such phenobarbital Digoxin Medications that relax muscles for surgery Narcotic medications for pain This list may not describe all possible interactions. Give your health care provider a list of all the medicines, herbs, non-prescription drugs, or dietary supplements you use. Also tell them if you smoke, drink alcohol, or use illegal drugs. Some items may interact with your medicine. What should I watch for while using this  medication? Your condition will be monitored carefully while you are receiving this medication. You may need blood work done while you are receiving this medication. What side effects may I notice from receiving this medication? Side effects that you should report to your care team as soon as possible: Allergic reactions--skin rash, itching, hives, swelling of the face, lips, tongue, or throat High magnesium level--confusion, drowsiness, facial flushing, redness, sweating, muscle weakness, fast or irregular heartbeat, trouble breathing Low blood pressure--dizziness, feeling faint or lightheaded, blurry vision Side effects that usually do not require medical attention (report to your care team if they continue or are bothersome): Headache Nausea This list may not describe all possible side effects. Call your doctor for medical advice about side effects. You may report side effects to FDA at 1-800-FDA-1088. Where should I keep my medication? This medication is given in a hospital or clinic and will not be stored at home. NOTE: This sheet is a summary. It may not cover all possible information. If you have questions about this medicine, talk to your doctor, pharmacist, or health care provider.  2024 Elsevier/Gold Standard (2021-06-24 00:00:00)  

## 2023-10-24 ENCOUNTER — Other Ambulatory Visit: Payer: Self-pay | Admitting: Physician Assistant

## 2023-10-24 ENCOUNTER — Encounter: Payer: Self-pay | Admitting: Physician Assistant

## 2023-10-24 ENCOUNTER — Other Ambulatory Visit (HOSPITAL_BASED_OUTPATIENT_CLINIC_OR_DEPARTMENT_OTHER): Payer: Self-pay

## 2023-10-24 ENCOUNTER — Other Ambulatory Visit: Payer: Self-pay

## 2023-10-24 ENCOUNTER — Ambulatory Visit
Admission: RE | Admit: 2023-10-24 | Discharge: 2023-10-24 | Disposition: A | Discharge status: Home / Self Care | Payer: Medicaid Other | Source: Ambulatory Visit | Attending: Radiation Oncology | Admitting: Radiation Oncology

## 2023-10-24 DIAGNOSIS — Z510 Encounter for antineoplastic radiation therapy: Secondary | ICD-10-CM | POA: Diagnosis not present

## 2023-10-24 DIAGNOSIS — F319 Bipolar disorder, unspecified: Secondary | ICD-10-CM

## 2023-10-24 LAB — RAD ONC ARIA SESSION SUMMARY
Course Elapsed Days: 17
Plan Fractions Treated to Date: 11
Plan Prescribed Dose Per Fraction: 2 Gy
Plan Total Fractions Prescribed: 25
Plan Total Prescribed Dose: 50 Gy
Reference Point Dosage Given to Date: 22 Gy
Reference Point Session Dosage Given: 2 Gy
Session Number: 11

## 2023-10-24 MED ORDER — QUETIAPINE FUMARATE 400 MG PO TABS: 800.0000 mg | ORAL_TABLET | Freq: Every day | ORAL | 3 refills | Status: DC

## 2023-10-25 ENCOUNTER — Encounter (HOSPITAL_COMMUNITY): Payer: Self-pay | Admitting: Hematology & Oncology

## 2023-10-25 ENCOUNTER — Ambulatory Visit: Payer: Medicaid Other

## 2023-10-25 ENCOUNTER — Encounter: Payer: Self-pay | Admitting: Radiation Oncology

## 2023-10-25 ENCOUNTER — Other Ambulatory Visit: Payer: Self-pay | Admitting: Pharmacist

## 2023-10-25 ENCOUNTER — Ambulatory Visit: Payer: Medicaid Other | Admitting: Physician Assistant

## 2023-10-25 DIAGNOSIS — C50011 Malignant neoplasm of nipple and areola, right female breast: Secondary | ICD-10-CM

## 2023-10-25 NOTE — Progress Notes (Signed)
 Referral received from social work for patient to apply for [employer].  Called patient and introduced myself as Dance Movement Psychotherapist and to offer available resources. Discussed grant qualifications and advised what is needed to apply. She will bring on 10/26/22 after Radiation to be scanned and emailed to me at registration. She will then be given grant paperwork to complete. Advised her to contact me at earliest convenience afterwards to discuss expense sheet in detail. Briefly discussed gas cards.  She will be given my card to contact me to discuss and for any additional financial questions or concerns.

## 2023-10-27 ENCOUNTER — Other Ambulatory Visit: Payer: Self-pay

## 2023-10-27 ENCOUNTER — Encounter: Payer: Self-pay | Admitting: Physician Assistant

## 2023-10-27 ENCOUNTER — Ambulatory Visit: Payer: Medicaid Other | Admitting: Physician Assistant

## 2023-10-27 ENCOUNTER — Ambulatory Visit
Admission: RE | Admit: 2023-10-27 | Discharge: 2023-10-27 | Disposition: A | Discharge status: Home / Self Care | Payer: Medicaid Other | Source: Ambulatory Visit | Attending: Radiation Oncology | Admitting: Radiation Oncology

## 2023-10-27 ENCOUNTER — Encounter: Payer: Self-pay | Admitting: Radiation Oncology

## 2023-10-27 ENCOUNTER — Ambulatory Visit: Payer: Medicaid Other

## 2023-10-27 VITALS — BP 131/79 | HR 115 | Temp 98.0°F | Ht 62.0 in | Wt 180.5 lb

## 2023-10-27 DIAGNOSIS — L589 Radiodermatitis, unspecified: Secondary | ICD-10-CM

## 2023-10-27 DIAGNOSIS — Z7901 Long term (current) use of anticoagulants: Secondary | ICD-10-CM | POA: Diagnosis not present

## 2023-10-27 DIAGNOSIS — Z86718 Personal history of other venous thrombosis and embolism: Secondary | ICD-10-CM | POA: Diagnosis not present

## 2023-10-27 DIAGNOSIS — Z510 Encounter for antineoplastic radiation therapy: Secondary | ICD-10-CM | POA: Diagnosis not present

## 2023-10-27 DIAGNOSIS — H04203 Unspecified epiphora, bilateral lacrimal glands: Secondary | ICD-10-CM | POA: Diagnosis not present

## 2023-10-27 DIAGNOSIS — Z5111 Encounter for antineoplastic chemotherapy: Secondary | ICD-10-CM | POA: Diagnosis present

## 2023-10-27 DIAGNOSIS — F17210 Nicotine dependence, cigarettes, uncomplicated: Secondary | ICD-10-CM | POA: Diagnosis not present

## 2023-10-27 DIAGNOSIS — C50012 Malignant neoplasm of nipple and areola, left female breast: Secondary | ICD-10-CM | POA: Diagnosis not present

## 2023-10-27 DIAGNOSIS — R300 Dysuria: Secondary | ICD-10-CM | POA: Diagnosis not present

## 2023-10-27 DIAGNOSIS — C770 Secondary and unspecified malignant neoplasm of lymph nodes of head, face and neck: Secondary | ICD-10-CM | POA: Insufficient documentation

## 2023-10-27 DIAGNOSIS — C50912 Malignant neoplasm of unspecified site of left female breast: Secondary | ICD-10-CM | POA: Diagnosis not present

## 2023-10-27 LAB — RAD ONC ARIA SESSION SUMMARY
Course Elapsed Days: 20
Plan Fractions Treated to Date: 12
Plan Prescribed Dose Per Fraction: 2 Gy
Plan Total Fractions Prescribed: 25
Plan Total Prescribed Dose: 50 Gy
Reference Point Dosage Given to Date: 24 Gy
Reference Point Session Dosage Given: 2 Gy
Session Number: 12

## 2023-10-27 NOTE — Progress Notes (Signed)
 Patient brought in partial documents for [employer] and completed paperwork. She will get the rest to me as soon as she received.  Approved for one-time $1000 [employer] grant to assist with personal expenses such as gas cards while going through treatment. She received a copy of the approval letter and expense sheet in green folder review. Advised to contact me at earliest convenience to discuss expenses in detail.  She has my card to do so and for any additional financial questions or concerns.

## 2023-10-27 NOTE — Progress Notes (Signed)
 Established patient visit   Patient: Ariana Herrera   DOB: 09-05-1966   58 y.o. Female  MRN: [id number] Visit Date: 10/27/2023  Today's healthcare provider: Manuelita Flatness, PA-C   Chief Complaint  Patient presents with   Medical Management of Chronic Issues    Patient states breast cancer is back. Found out in the beginning of December. Also wanting to renew handcap placard.   Subjective    Ariana Herrera presents today w/ an update on her met. Breast cancer and for a hospital f/u.  She was admitted 12/11-12/18 for internal jugular vein thromboembolism, metastatic breast cancer.  Her L sided neck/facial swelling and firmness of tissue was 2/2 to the above conditions, discovered on outpatient MRI ordered by oncology. She has restarted radiation therapy and has plans to continue chemotherapy.  Overall she is managing as well as expected. She has superficial radiation burns, she reports thrush--being treated. She is having difficulty eating 2/2 to head/neck radiation and thrush. Her albumin was low in the hospital.   She also reports frequent eye tearing, especially on the left side.  Medications: Outpatient Medications Prior to Visit  Medication Sig   ACCU-CHEK GUIDE test strip 3 (three) times daily.   albuterol  (ACCUNEB ) 1.25 MG/3ML nebulizer solution Take 1 ampule by nebulization every 6 (six) hours as needed for wheezing or shortness of breath.   albuterol  (VENTOLIN  HFA) 108 (90 Base) MCG/ACT inhaler Inhale 2 puffs into the lungs every 6 (six) hours as needed for wheezing or shortness of breath.   clotrimazole  (MYCELEX ) 10 MG troche Take 1 tablet (10 mg total) by mouth 5 (five) times daily. Slowly dissolve in mouth   Continuous Glucose Sensor (FREESTYLE LIBRE 3 SENSOR) MISC Apply 1 sensor every 14 (fourteen) days.   cyclobenzaprine  (FLEXERIL ) 5 MG tablet Take 5 mg by mouth 2 (two) times daily as needed for muscle spasms.   diazepam  (VALIUM ) 2 MG tablet Take 2 mg by mouth 2  (two) times daily as needed for anxiety.   enoxaparin  (LOVENOX ) 80 MG/0.8ML injection Inject 0.8 mLs (80 mg total) into the skin every 12 (twelve) hours.   famotidine  (PEPCID ) 20 MG tablet Take 20 mg by mouth 2 (two) times daily.   fenofibrate  (TRICOR ) 145 MG tablet Take 145 mg by mouth daily.   gabapentin  (NEURONTIN ) 300 MG capsule Take 1 capsule (300 mg total) by mouth 3 (three) times daily.   HYDROcodone -acetaminophen  (NORCO) 7.5-325 MG tablet Take 1 tablet by mouth every 6 (six) hours as needed for moderate pain (pain score 4-6).   insulin  aspart (NOVOLOG  FLEXPEN) 100 UNIT/ML FlexPen Inject 3 Units into the skin 3 (three) times daily with meals. (Patient taking differently: Inject 4-14 Units into the skin See admin instructions. Depending on BGL, inject 4-10 units into the skin after breakfast, 6-12 units after lunch, and 8-14 units after supper)   linaclotide  (LINZESS ) 290 MCG CAPS capsule Take 1 capsule (290 mcg total) by mouth daily before breakfast.   metFORMIN  (GLUCOPHAGE ) 1000 MG tablet Take 1,000 mg by mouth 2 (two) times daily with a meal.   metoprolol  tartrate (LOPRESSOR ) 25 MG tablet Take 1 tablet (25 mg total) by mouth 2 (two) times daily.   nitroGLYCERIN  (NITROSTAT ) 0.4 MG SL tablet Place 1 tablet (0.4 mg total) under the tongue every 5 (five) minutes as needed for chest pain.   ondansetron  (ZOFRAN ) 8 MG tablet Take 1 tablet (8 mg total) by mouth every 8 (eight) hours as needed for nausea or vomiting.  START ON THE THIRD DAY AFTER CHEMOTHERAPY.   oxybutynin  (DITROPAN -XL) 5 MG 24 hr tablet Take 1 tablet (5 mg total) by mouth at bedtime.   pantoprazole  (PROTONIX ) 40 MG tablet Take 1 tablet (40 mg total) by mouth daily.   polyethylene glycol (MIRALAX  / GLYCOLAX ) 17 g packet Take 17 g by mouth daily as needed.   prochlorperazine  (COMPAZINE ) 10 MG tablet Take 1 tablet (10 mg total) by mouth every 6 (six) hours as needed for nausea or vomiting (Zofran  resumes 12/16).   QUEtiapine  (SEROQUEL )  400 MG tablet Take 2 tablets (800 mg total) by mouth at bedtime.   rosuvastatin  (CRESTOR ) 40 MG tablet Take 1 tablet (40 mg total) by mouth daily.   senna (SENOKOT) 8.6 MG TABS tablet Take 1 tablet (8.6 mg total) by mouth 2 (two) times daily.   TRESIBA FLEXTOUCH 200 UNIT/ML FlexTouch Pen Inject 26 Units into the skin at bedtime.   [DISCONTINUED] fentaNYL  (DURAGESIC ) 50 MCG/HR Place 1 patch onto the skin every 3 (three) days.   Facility-Administered Medications Prior to Visit  Medication Dose Route Frequency Provider   sodium chloride  flush (NS) 0.9 % injection 10 mL  10 mL Intravenous PRN Timmy Maude SAUNDERS, MD    Review of Systems  Constitutional:  Negative for fatigue and fever.  HENT:  Positive for trouble swallowing.   Eyes:  Positive for discharge.  Respiratory:  Negative for cough and shortness of breath.   Cardiovascular:  Negative for chest pain and leg swelling.  Gastrointestinal:  Negative for abdominal pain.  Neurological:  Negative for dizziness and headaches.       Objective    BP 131/79   Pulse (!) 115   Temp 98 F (36.7 C) (Oral)   Ht 5' 2 (1.575 m)   Wt 180 lb 8 oz (81.9 kg)   LMP 10/04/2016 Comment: after procedure to stop bleeding  SpO2 97%   BMI 33.01 kg/m    Physical Exam Vitals reviewed.  Constitutional:      Appearance: She is not ill-appearing.  HENT:     Head: Normocephalic.  Eyes:     Conjunctiva/sclera: Conjunctivae normal.  Cardiovascular:     Rate and Rhythm: Normal rate.  Pulmonary:     Effort: Pulmonary effort is normal. No respiratory distress.  Skin:    Comments: Erythema to L neck/chest  Neurological:     General: No focal deficit present.     Mental Status: She is alert and oriented to person, place, and time.  Psychiatric:        Mood and Affect: Mood normal.        Behavior: Behavior normal.     No results found for any visits on 10/27/23.  Assessment & Plan    Eye tearing, bilateral  Radiation dermatitis  Malignant  neoplasm involving both nipple and areola of left breast in female, unspecified estrogen receptor status (HCC) Assessment & Plan: Met. Breast cancer Currently undergoing rad therapy with planned additional chemo Difficulty eating due to oropharyngeal candidiasis. Drinking Ensure for nutritional support.  Advised to maintain daily activities and rest. Discussed the importance of staying motivated and exercising without overexertion. Provided a handicap placard for mobility assistance.     Radiation-Induced Eye Irritation Constant eye irritation since starting radiation therapy, primarily affecting the left eye. Physical examination does not show significant dryness - Recommend using Systane eye drops several times a day.  Radiation-Induced Dermatitis Discussed using aloe for cooling followed by an ice pack for additional relief.  Return if symptoms worsen or fail to improve.       Manuelita Flatness, PA-C  [facility] [phone] (phone) [phone] (fax)  [facility]

## 2023-10-27 NOTE — Assessment & Plan Note (Signed)
 Met. Breast cancer Currently undergoing rad therapy with planned additional chemo Difficulty eating due to oropharyngeal candidiasis. Drinking Ensure for nutritional support.  Advised to maintain daily activities and rest. Discussed the importance of staying motivated and exercising without overexertion. Provided a handicap placard for mobility assistance.

## 2023-10-28 ENCOUNTER — Other Ambulatory Visit: Payer: Self-pay

## 2023-10-28 ENCOUNTER — Ambulatory Visit
Admission: RE | Admit: 2023-10-28 | Discharge: 2023-10-28 | Disposition: A | Discharge status: Home / Self Care | Payer: Medicaid Other | Source: Ambulatory Visit | Attending: Radiation Oncology | Admitting: Radiation Oncology

## 2023-10-28 DIAGNOSIS — Z510 Encounter for antineoplastic radiation therapy: Secondary | ICD-10-CM | POA: Diagnosis not present

## 2023-10-28 LAB — RAD ONC ARIA SESSION SUMMARY
Course Elapsed Days: 21
Plan Fractions Treated to Date: 13
Plan Prescribed Dose Per Fraction: 2 Gy
Plan Total Fractions Prescribed: 25
Plan Total Prescribed Dose: 50 Gy
Reference Point Dosage Given to Date: 26 Gy
Reference Point Session Dosage Given: 2 Gy
Session Number: 13

## 2023-10-31 ENCOUNTER — Other Ambulatory Visit: Payer: Self-pay

## 2023-10-31 ENCOUNTER — Encounter: Payer: Self-pay | Admitting: Hematology & Oncology

## 2023-10-31 ENCOUNTER — Ambulatory Visit
Admission: RE | Admit: 2023-10-31 | Discharge: 2023-10-31 | Disposition: A | Discharge status: Home / Self Care | Payer: Medicaid Other | Source: Ambulatory Visit | Attending: Radiation Oncology | Admitting: Radiation Oncology

## 2023-10-31 ENCOUNTER — Other Ambulatory Visit: Payer: Self-pay | Admitting: Hematology & Oncology

## 2023-10-31 ENCOUNTER — Inpatient Hospital Stay (HOSPITAL_BASED_OUTPATIENT_CLINIC_OR_DEPARTMENT_OTHER): Payer: Medicaid Other | Admitting: Hematology & Oncology

## 2023-10-31 ENCOUNTER — Inpatient Hospital Stay: Payer: Medicaid Other

## 2023-10-31 ENCOUNTER — Telehealth: Payer: Self-pay | Admitting: Radiation Oncology

## 2023-10-31 VITALS — BP 149/72 | HR 104

## 2023-10-31 VITALS — BP 121/55 | HR 90 | Temp 98.2°F | Resp 20 | Ht 62.0 in | Wt 180.1 lb

## 2023-10-31 DIAGNOSIS — Z510 Encounter for antineoplastic radiation therapy: Secondary | ICD-10-CM | POA: Diagnosis not present

## 2023-10-31 DIAGNOSIS — R110 Nausea: Secondary | ICD-10-CM | POA: Insufficient documentation

## 2023-10-31 DIAGNOSIS — Z5111 Encounter for antineoplastic chemotherapy: Secondary | ICD-10-CM | POA: Insufficient documentation

## 2023-10-31 DIAGNOSIS — C50011 Malignant neoplasm of nipple and areola, right female breast: Secondary | ICD-10-CM | POA: Diagnosis not present

## 2023-10-31 DIAGNOSIS — F17210 Nicotine dependence, cigarettes, uncomplicated: Secondary | ICD-10-CM | POA: Insufficient documentation

## 2023-10-31 DIAGNOSIS — Z86718 Personal history of other venous thrombosis and embolism: Secondary | ICD-10-CM

## 2023-10-31 DIAGNOSIS — C50912 Malignant neoplasm of unspecified site of left female breast: Secondary | ICD-10-CM | POA: Insufficient documentation

## 2023-10-31 DIAGNOSIS — E119 Type 2 diabetes mellitus without complications: Secondary | ICD-10-CM | POA: Insufficient documentation

## 2023-10-31 DIAGNOSIS — C50012 Malignant neoplasm of nipple and areola, left female breast: Secondary | ICD-10-CM

## 2023-10-31 DIAGNOSIS — Z95828 Presence of other vascular implants and grafts: Secondary | ICD-10-CM

## 2023-10-31 DIAGNOSIS — R300 Dysuria: Secondary | ICD-10-CM | POA: Insufficient documentation

## 2023-10-31 LAB — CBC WITH DIFFERENTIAL (CANCER CENTER ONLY)
Abs Immature Granulocytes: 0.04 10*3/uL (ref 0.00–0.07)
Basophils Absolute: 0 10*3/uL (ref 0.0–0.1)
Basophils Relative: 1 %
Eosinophils Absolute: 0 10*3/uL (ref 0.0–0.5)
Eosinophils Relative: 1 %
HCT: 31.5 % — ABNORMAL LOW (ref 36.0–46.0)
Hemoglobin: 10.5 g/dL — ABNORMAL LOW (ref 12.0–15.0)
Immature Granulocytes: 1 %
Lymphocytes Relative: 30 %
Lymphs Abs: 1 10*3/uL (ref 0.7–4.0)
MCH: 31.3 pg (ref 26.0–34.0)
MCHC: 33.3 g/dL (ref 30.0–36.0)
MCV: 93.8 fL (ref 80.0–100.0)
Monocytes Absolute: 0.6 10*3/uL (ref 0.1–1.0)
Monocytes Relative: 18 %
Neutro Abs: 1.7 10*3/uL (ref 1.7–7.7)
Neutrophils Relative %: 49 %
Platelet Count: 245 10*3/uL (ref 150–400)
RBC: 3.36 MIL/uL — ABNORMAL LOW (ref 3.87–5.11)
RDW: 16.8 % — ABNORMAL HIGH (ref 11.5–15.5)
WBC Count: 3.3 10*3/uL — ABNORMAL LOW (ref 4.0–10.5)
nRBC: 0 % (ref 0.0–0.2)

## 2023-10-31 LAB — RAD ONC ARIA SESSION SUMMARY
Course Elapsed Days: 24
Plan Fractions Treated to Date: 14
Plan Prescribed Dose Per Fraction: 2 Gy
Plan Total Fractions Prescribed: 25
Plan Total Prescribed Dose: 50 Gy
Reference Point Dosage Given to Date: 28 Gy
Reference Point Session Dosage Given: 2 Gy
Session Number: 14

## 2023-10-31 LAB — CMP (CANCER CENTER ONLY)
ALT: 9 U/L (ref 0–44)
AST: 10 U/L — ABNORMAL LOW (ref 15–41)
Albumin: 3.8 g/dL (ref 3.5–5.0)
Alkaline Phosphatase: 76 U/L (ref 38–126)
Anion gap: 9 (ref 5–15)
BUN: 19 mg/dL (ref 6–20)
CO2: 24 mmol/L (ref 22–32)
Calcium: 8.9 mg/dL (ref 8.9–10.3)
Chloride: 100 mmol/L (ref 98–111)
Creatinine: 0.84 mg/dL (ref 0.44–1.00)
GFR, Estimated: >60 mL/min (ref >60)
Glucose, Bld: 308 mg/dL — ABNORMAL HIGH (ref 70–99)
Potassium: 4.3 mmol/L (ref 3.5–5.1)
Sodium: 133 mmol/L — ABNORMAL LOW (ref 135–145)
Total Bilirubin: 0.3 mg/dL (ref 0.0–1.2)
Total Protein: 6.6 g/dL (ref 6.5–8.1)

## 2023-10-31 LAB — URINALYSIS, COMPLETE (UACMP) WITH MICROSCOPIC
Bilirubin Urine: NEGATIVE
Glucose, UA: NEGATIVE mg/dL
Hgb urine dipstick: NEGATIVE
Ketones, ur: NEGATIVE mg/dL
Nitrite: POSITIVE — AB
Protein, ur: NEGATIVE mg/dL
Specific Gravity, Urine: 1.025 (ref 1.005–1.030)
pH: 5.5 (ref 5.0–8.0)

## 2023-10-31 LAB — MAGNESIUM: Magnesium: 1.2 mg/dL — ABNORMAL LOW (ref 1.7–2.4)

## 2023-10-31 LAB — LACTATE DEHYDROGENASE: LDH: 140 U/L (ref 98–192)

## 2023-10-31 MED ORDER — PALONOSETRON HCL INJECTION 0.25 MG/5ML
0.2500 mg | Freq: Once | INTRAVENOUS | Status: AC
  Administered 2023-10-31: 0.25 mg via INTRAVENOUS

## 2023-10-31 MED ORDER — SODIUM CHLORIDE 0.9 % IV SOLN
INTRAVENOUS | Status: DC
Start: 2023-10-31 — End: 2023-10-31

## 2023-10-31 MED ORDER — INSULIN ASPART 100 UNIT/ML IJ SOLN
15.0000 [IU] | Freq: Once | INTRAMUSCULAR | Status: AC
Start: 2023-10-31 — End: 2023-10-31
  Administered 2023-10-31: 15 [IU] via SUBCUTANEOUS
  Filled 2023-10-31: qty 0.15

## 2023-10-31 MED ORDER — CISPLATIN CHEMO INJECTION 100MG/100ML
67.5000 mg/m2 | Freq: Once | INTRAVENOUS | Status: AC
  Administered 2023-10-31: 130 mg via INTRAVENOUS
  Filled 2023-10-31: qty 100

## 2023-10-31 MED ORDER — FLUOROURACIL CHEMO INJECTION 5 GM/100ML
600.0000 mg/m2/d | INTRAVENOUS | Status: DC
  Administered 2023-10-31: 5000 mg via INTRAVENOUS
  Filled 2023-10-31: qty 100

## 2023-10-31 MED ORDER — SODIUM CHLORIDE 0.9 % IV SOLN
150.0000 mg | Freq: Once | INTRAVENOUS | Status: AC
  Administered 2023-10-31: 150 mg via INTRAVENOUS
  Filled 2023-10-31: qty 150

## 2023-10-31 MED ORDER — POTASSIUM CHLORIDE IN NACL 20-0.9 MEQ/L-% IV SOLN
Freq: Once | INTRAVENOUS | Status: AC
Start: 2023-10-31 — End: 2023-10-31
  Filled 2023-10-31: qty 1000

## 2023-10-31 MED ORDER — SODIUM CHLORIDE 0.9% FLUSH
10.0000 mL | INTRAVENOUS | Status: DC | PRN
Start: 2023-10-31 — End: 2023-10-31

## 2023-10-31 MED ORDER — MAGNESIUM SULFATE 2 GM/50ML IV SOLN
2.0000 g | Freq: Once | INTRAVENOUS | Status: AC
Start: 2023-10-31 — End: 2023-10-31
  Administered 2023-10-31: 2 g via INTRAVENOUS
  Filled 2023-10-31: qty 50

## 2023-10-31 MED ORDER — DEXAMETHASONE SODIUM PHOSPHATE 10 MG/ML IJ SOLN
10.0000 mg | Freq: Once | INTRAMUSCULAR | Status: AC
Start: 2023-10-31 — End: 2023-10-31
  Administered 2023-10-31: 10 mg via INTRAVENOUS

## 2023-10-31 MED ORDER — SODIUM CHLORIDE 0.9% FLUSH: 10.0000 mL | Freq: Once | INTRAVENOUS | Status: DC

## 2023-10-31 MED ORDER — LIDOCAINE-PRILOCAINE 2.5-2.5 % EX CREA: 1.0000 | TOPICAL_CREAM | CUTANEOUS | 5 refills | Status: DC | PRN

## 2023-10-31 MED ORDER — HEPARIN SOD (PORK) LOCK FLUSH 100 UNIT/ML IV SOLN: 500.0000 [IU] | Freq: Once | INTRAVENOUS | Status: DC | PRN

## 2023-10-31 NOTE — Patient Instructions (Signed)
 [facility] - A DEPT OF [facility]  Discharge Instructions: Thank you for choosing [facility] to provide your oncology and hematology care.   If you have a lab appointment with the Cancer Center, please go directly to the Cancer Center and check in at the registration area.  Wear comfortable clothing and clothing appropriate for easy access to any Portacath or PICC line.   We strive to give you quality time with your provider. You may need to reschedule your appointment if you arrive late (15 or more minutes).  Arriving late affects you and other patients whose appointments are after yours.  Also, if you miss three or more appointments without notifying the office, you may be dismissed from the clinic at the provider's discretion.      For prescription refill requests, have your pharmacy contact our office and allow 72 hours for refills to be completed.    Today you received the following chemotherapy and/or immunotherapy agents Cisplatin /54fu /home pump      To help prevent nausea and vomiting after your treatment, we encourage you to take your nausea medication as directed.  BELOW ARE SYMPTOMS THAT SHOULD BE REPORTED IMMEDIATELY: *FEVER GREATER THAN 100.4 F (38 C) OR HIGHER *CHILLS OR SWEATING *NAUSEA AND VOMITING THAT IS NOT CONTROLLED WITH YOUR NAUSEA MEDICATION *UNUSUAL SHORTNESS OF BREATH *UNUSUAL BRUISING OR BLEEDING *URINARY PROBLEMS (pain or burning when urinating, or frequent urination) *BOWEL PROBLEMS (unusual diarrhea, constipation, pain near the anus) TENDERNESS IN MOUTH AND THROAT WITH OR WITHOUT PRESENCE OF ULCERS (sore throat, sores in mouth, or a toothache) UNUSUAL RASH, SWELLING OR PAIN  UNUSUAL VAGINAL DISCHARGE OR ITCHING   Items with * indicate a potential emergency and should be followed up as soon as possible or go to the Emergency Department if any problems should occur.  Please show the CHEMOTHERAPY ALERT CARD or  IMMUNOTHERAPY ALERT CARD at check-in to the Emergency Department and triage nurse. Should you have questions after your visit or need to cancel or reschedule your appointment, please contact [facility] - A DEPT OF [facility]  [phone] and follow the prompts.  Office hours are 8:00 a.m. to 4:30 p.m. Monday - Friday. Please note that voicemails left after 4:00 p.m. may not be returned until the following business day.  We are closed weekends and major holidays. You have access to a nurse at all times for urgent questions. Please call the main number to the clinic [phone] and follow the prompts.  For any non-urgent questions, you may also contact your provider using MyChart. We now offer e-Visits for anyone 23 and older to request care online for non-urgent symptoms. For details visit [email or web address].   Also download the MyChart app! Go to the app store, search MyChart, open the app, select , and log in with your MyChart username and password.

## 2023-10-31 NOTE — Progress Notes (Signed)
 Hematology and Oncology Follow Up Visit  Ariana Herrera [id number] 1966/04/04 58 y.o. 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  I hate the fact that she is smoking a pack and half a day of cigarettes.  This really is going to affect how she responds to treatment.  She is does not want any nicotine  patches.  She does not want any Chantix.  I told her that if she continues to smoke, she will not do nearly as well.  In addition, this would allow her cancer to start growing.  Overall, I think she is done well with her chemotherapy.  She is doing radiation therapy.  She still able to swallow.  She is Ariana Herrera a little bit of nausea.  She does complain of some dysuria.  We will check a UA and culture on her.  She has had no fever.  There has been no bleeding.  She is on Lovenox  for the internal jugular vein thrombus.  Her face does not look nearly as swollen.  She has had no problems with her bowels or bladder.  She goes to the bathroom once a week for her bowels.  This is chronic for her.  There is been no rashes.  She has had no leg swelling.  Overall, I would say that her performance status is probably ECOG 1.    Medications:  Allergies as of 10/31/2023       Reactions   Lithium Other (See Comments)   Spinal fluid built up in brain   Dulaglutide  Nausea And Vomiting, Other (See Comments)   TRULICITY    Penicillins Other (See Comments)   UNKNOWN CHILDHOOD REACTION        Medication  List        Accurate as of October 31, 2023  8:28 AM. If you have any questions, ask your nurse or doctor.          Accu-Chek Guide test strip Generic drug: glucose blood 3 (three) times daily.   albuterol  1.25 MG/3ML nebulizer solution Commonly known as: ACCUNEB  Take 1 ampule by nebulization every 6 (six) hours as needed for wheezing or shortness of breath.   albuterol  108 (90 Base) MCG/ACT inhaler Commonly known as: VENTOLIN  HFA Inhale 2 puffs into the lungs every 6 (six) hours as needed for wheezing or shortness of breath.   clotrimazole  10 MG troche Commonly known as: MYCELEX  Take 1 tablet (10 mg total) by mouth 5 (five) times daily. Slowly dissolve in mouth   cyclobenzaprine  5 MG tablet Commonly known as: FLEXERIL  Take 5 mg by mouth 2 (two) times daily as needed for muscle spasms.   diazepam  2 MG tablet Commonly known as: VALIUM  Take 2 mg by mouth 2 (two) times daily as needed for anxiety.   enoxaparin  80 MG/0.8ML injection Commonly known as: LOVENOX  Inject 0.8 mLs (80 mg total) into the skin every 12 (twelve) hours.   famotidine  20 MG tablet Commonly known as: PEPCID  Take 20 mg by mouth  2 (two) times daily.   fenofibrate  145 MG tablet Commonly known as: TRICOR  Take 145 mg by mouth daily.   FreeStyle Libre 3 Sensor Misc Apply 1 sensor every 14 (fourteen) days.   gabapentin  300 MG capsule Commonly known as: NEURONTIN  Take 1 capsule (300 mg total) by mouth 3 (three) times daily.   HYDROcodone -acetaminophen  7.5-325 MG tablet Commonly known as: Norco Take 1 tablet by mouth every 6 (six) hours as needed for moderate pain (pain score 4-6).   linaclotide  290 MCG Caps capsule Commonly known as: Linzess  Take 1 capsule (290 mcg total) by mouth daily before breakfast.   metFORMIN  1000 MG tablet Commonly known as: GLUCOPHAGE  Take 1,000 mg by mouth 2 (two) times daily with a meal.   metoprolol  tartrate 25 MG tablet Commonly known as: LOPRESSOR  Take 1 tablet  (25 mg total) by mouth 2 (two) times daily.   nitroGLYCERIN  0.4 MG SL tablet Commonly known as: NITROSTAT  Place 1 tablet (0.4 mg total) under the tongue every 5 (five) minutes as needed for chest pain.   NovoLOG  FlexPen 100 UNIT/ML FlexPen Generic drug: insulin  aspart Inject 3 Units into the skin 3 (three) times daily with meals. What changed:  how much to take when to take this additional instructions   ondansetron  8 MG tablet Commonly known as: ZOFRAN  Take 1 tablet (8 mg total) by mouth every 8 (eight) hours as needed for nausea or vomiting. START ON THE THIRD DAY AFTER CHEMOTHERAPY.   oxybutynin  5 MG 24 hr tablet Commonly known as: DITROPAN -XL Take 1 tablet (5 mg total) by mouth at bedtime.   pantoprazole  40 MG tablet Commonly known as: PROTONIX  Take 1 tablet (40 mg total) by mouth daily.   PEG 3350  17 g Pack Take 17 g by mouth daily as needed.   prochlorperazine  10 MG tablet Commonly known as: COMPAZINE  Take 1 tablet (10 mg total) by mouth every 6 (six) hours as needed for nausea or vomiting (Zofran  resumes 12/16).   QUEtiapine  400 MG tablet Commonly known as: SEROQUEL  Take 2 tablets (800 mg total) by mouth at bedtime.   rosuvastatin  40 MG tablet Commonly known as: CRESTOR  Take 1 tablet (40 mg total) by mouth daily.   senna 8.6 MG Tabs tablet Commonly known as: SENOKOT Take 1 tablet (8.6 mg total) by mouth 2 (two) times daily.   Tresiba FlexTouch 200 UNIT/ML FlexTouch Pen Generic drug: insulin  degludec Inject 26 Units into the skin at bedtime.        Allergies:  Allergies  Allergen Reactions   Lithium Other (See Comments)    Spinal fluid built up in brain   Dulaglutide  Nausea And Vomiting and Other (See Comments)    TRULICITY    Penicillins Other (See Comments)    UNKNOWN CHILDHOOD REACTION    Past Medical History, Surgical history, Social history, and Family History were reviewed and updated.  Review of Systems: Review of Systems   Constitutional:  Positive for malaise/fatigue.  HENT: Negative.    Eyes: Negative.   Respiratory: Negative.    Cardiovascular:  Positive for leg swelling.  Gastrointestinal:  Positive for nausea.  Genitourinary: Negative.   Musculoskeletal:  Positive for joint pain and myalgias.  Skin: Negative.   Neurological:  Positive for tingling.  Endo/Heme/Allergies: Negative.   Psychiatric/Behavioral: Negative.       Physical Exam:  height is 5' 2 (1.575 m) and weight is 180 lb 1.3 oz (81.7 kg). Her oral temperature is 98.2 F (36.8 C). Her blood pressure is 121/55 (abnormal)  and her pulse is 90. Her respiration is 20 and oxygen saturation is 96%.   Wt Readings from Last 3 Encounters:  10/31/23 180 lb 1.3 oz (81.7 kg)  10/27/23 180 lb 8 oz (81.9 kg)  10/20/23 183 lb 1.9 oz (83.1 kg)    Physical Exam Vitals reviewed.  Constitutional:      Comments: On her breast exam, the right breast is without masses, edema or erythema.  There is no right axillary adenopathy.  Her left breast is somewhat contracted from radiation and surgery.  It is somewhat firm.  It is somewhat swollen although still contracted.  It is tender.  I cannot palpate any adenopathy in the left axilla.  There is no left nipple discharge.  HENT:     Head: Normocephalic and atraumatic.     Comments: She does have some swelling of the face.  This is more so on the left side.  She has some firmness on the left side of her neck.  There is some slight erythema.  I do not see any obvious thrush in the oral cavity. Eyes:     Pupils: Pupils are equal, round, and reactive to light.  Cardiovascular:     Rate and Rhythm: Normal rate and regular rhythm.     Heart sounds: Normal heart sounds.  Pulmonary:     Effort: Pulmonary effort is normal.     Breath sounds: Normal breath sounds.  Abdominal:     General: Bowel sounds are normal.     Palpations: Abdomen is soft.  Musculoskeletal:        General: No tenderness or deformity.  Normal range of motion.     Cervical back: Normal range of motion.  Lymphadenopathy:     Cervical: No cervical adenopathy.  Skin:    General: Skin is warm and dry.     Findings: No erythema or rash.  Neurological:     Mental Status: She is alert and oriented to person, place, and time.  Psychiatric:        Behavior: Behavior normal.        Thought Content: Thought content normal.        Judgment: Judgment normal.     Lab Results  Component Value Date   WBC 4.0 10/20/2023   HGB 9.8 (L) 10/20/2023   HCT 28.6 (L) 10/20/2023   MCV 91.1 10/20/2023   PLT 75 (L) 10/20/2023   Lab Results  Component Value Date   FERRITIN 33 08/25/2023   IRON 61 08/25/2023   TIBC 400 08/25/2023   UIBC 339 08/25/2023   IRONPCTSAT 15 08/25/2023   Lab Results  Component Value Date   RETICCTPCT 4.0 (H) 08/05/2022   RBC 3.14 (L) 10/20/2023   No results found for: KPAFRELGTCHN, LAMBDASER, KAPLAMBRATIO No results found for: IGGSERUM, IGA, IGMSERUM No results found for: STEPHANY RINGS, A1GS, A2GS, EARLA JOANNIE DOC VICK, SPEI   Chemistry      Component Value Date/Time   NA 136 10/20/2023 0759   K 4.2 10/20/2023 0759   CL 100 10/20/2023 0759   CO2 26 10/20/2023 0759   BUN 11 10/20/2023 0759   CREATININE 0.77 10/20/2023 0759      Component Value Date/Time   CALCIUM  9.0 10/20/2023 0759   ALKPHOS 62 10/20/2023 0759   AST 11 (L) 10/20/2023 0759   ALT 15 10/20/2023 0759   BILITOT 0.4 10/20/2023 0759       Impression and Plan: Ms. Shone is a very pleasant  58 yo caucasian  female with recurrent ductal carcinoma of the left breast.  It is amazing that the recurrence is just been in the neck.  I am just happy that she is doing well with her radiation chemotherapy.  I think this is working.  She does not need to have nearly as much swelling on the left side of her neck.  This still feels firm but not as firm.  We will go ahead with her second cycle of  treatment.  I think she finishes up her radiation in 2 weeks.  We probably would not do any scans on her for at least 6 weeks after her treatment.  I may want to consider just chemotherapy in the meantime.  I will have her come back to see us  in another 3 weeks.   Maude JONELLE Crease, MD 1/6/20258:28 AM

## 2023-10-31 NOTE — Progress Notes (Signed)
 Pt. in for first time CISPLATIN /5FU PUMP today. Pt. ceceived first does in hospital. New consent obtained. Pt. stated that she is having burning on urination with foul smell.  U/A, culture obtained. Urine cloudy. Blood glucose 308, pt. given insulin  in clinic per MD.  New chemo treatment review included home infusion education. Urine output 250 prior to chemo. Per Dr. FORBES, ok to infuse cisplatin  with K hydration.

## 2023-10-31 NOTE — Telephone Encounter (Signed)
 1/6 @ 11:38 am Patient's physician's office called, patient will be completing her chemo today by 3:30 pm in [facility] and will be running late for her radiation treatment for today.  Email sent to Support RTT/L2 machine, so they are aware.

## 2023-11-01 ENCOUNTER — Other Ambulatory Visit: Payer: Self-pay

## 2023-11-01 ENCOUNTER — Other Ambulatory Visit: Payer: Self-pay | Admitting: Radiation Oncology

## 2023-11-01 ENCOUNTER — Ambulatory Visit
Admission: RE | Admit: 2023-11-01 | Discharge: 2023-11-01 | Disposition: A | Discharge status: Home / Self Care | Payer: Medicaid Other | Source: Ambulatory Visit | Attending: Radiation Oncology | Admitting: Radiation Oncology

## 2023-11-01 ENCOUNTER — Ambulatory Visit: Payer: Medicaid Other

## 2023-11-01 DIAGNOSIS — C50011 Malignant neoplasm of nipple and areola, right female breast: Secondary | ICD-10-CM

## 2023-11-01 DIAGNOSIS — Z510 Encounter for antineoplastic radiation therapy: Secondary | ICD-10-CM | POA: Diagnosis not present

## 2023-11-01 LAB — RAD ONC ARIA SESSION SUMMARY
Course Elapsed Days: 25
Plan Fractions Treated to Date: 15
Plan Prescribed Dose Per Fraction: 2 Gy
Plan Total Fractions Prescribed: 25
Plan Total Prescribed Dose: 50 Gy
Reference Point Dosage Given to Date: 30 Gy
Reference Point Session Dosage Given: 2 Gy
Session Number: 15

## 2023-11-01 LAB — CANCER ANTIGEN 27.29: CA 27.29: 72 U/mL — ABNORMAL HIGH (ref 0.0–38.6)

## 2023-11-01 MED ORDER — SULFAMETHOXAZOLE-TRIMETHOPRIM 800-160 MG PO TABS: 1.0000 | ORAL_TABLET | Freq: Two times a day (BID) | ORAL | 0 refills | Status: DC

## 2023-11-02 ENCOUNTER — Ambulatory Visit
Admission: RE | Admit: 2023-11-02 | Discharge: 2023-11-02 | Disposition: A | Discharge status: Home / Self Care | Payer: Medicaid Other | Source: Ambulatory Visit | Attending: Radiation Oncology | Admitting: Radiation Oncology

## 2023-11-02 ENCOUNTER — Other Ambulatory Visit: Payer: Self-pay

## 2023-11-02 ENCOUNTER — Other Ambulatory Visit: Payer: Self-pay | Admitting: *Deleted

## 2023-11-02 ENCOUNTER — Telehealth: Payer: Self-pay | Admitting: *Deleted

## 2023-11-02 DIAGNOSIS — Z510 Encounter for antineoplastic radiation therapy: Secondary | ICD-10-CM | POA: Diagnosis not present

## 2023-11-02 LAB — URINE CULTURE: Culture: >=100000 — AB

## 2023-11-02 LAB — RAD ONC ARIA SESSION SUMMARY
Course Elapsed Days: 26
Plan Fractions Treated to Date: 16
Plan Prescribed Dose Per Fraction: 2 Gy
Plan Total Fractions Prescribed: 25
Plan Total Prescribed Dose: 50 Gy
Reference Point Dosage Given to Date: 32 Gy
Reference Point Session Dosage Given: 2 Gy
Session Number: 16

## 2023-11-02 MED ORDER — NITROFURANTOIN MONOHYD MACRO 100 MG PO CAPS: 100.0000 mg | ORAL_CAPSULE | Freq: Two times a day (BID) | ORAL | 0 refills | Status: DC

## 2023-11-02 NOTE — Telephone Encounter (Signed)
 Dr Myna Hidalgo is in agreement that Patient will need to get her 5FU pump taken off earlier than 230p slated time due to impending snow storm in this area.  Ok per Dr Myna Hidalgo

## 2023-11-03 ENCOUNTER — Other Ambulatory Visit: Payer: Self-pay

## 2023-11-03 ENCOUNTER — Ambulatory Visit
Admission: RE | Admit: 2023-11-03 | Discharge: 2023-11-03 | Disposition: A | Discharge status: Home / Self Care | Payer: Medicaid Other | Source: Ambulatory Visit | Attending: Radiation Oncology | Admitting: Radiation Oncology

## 2023-11-03 ENCOUNTER — Encounter: Payer: Self-pay | Admitting: Hematology & Oncology

## 2023-11-03 DIAGNOSIS — Z510 Encounter for antineoplastic radiation therapy: Secondary | ICD-10-CM | POA: Diagnosis not present

## 2023-11-03 LAB — RAD ONC ARIA SESSION SUMMARY
Course Elapsed Days: 27
Plan Fractions Treated to Date: 17
Plan Prescribed Dose Per Fraction: 2 Gy
Plan Total Fractions Prescribed: 25
Plan Total Prescribed Dose: 50 Gy
Reference Point Dosage Given to Date: 34 Gy
Reference Point Session Dosage Given: 2 Gy
Session Number: 17

## 2023-11-04 ENCOUNTER — Other Ambulatory Visit: Payer: Self-pay

## 2023-11-04 ENCOUNTER — Inpatient Hospital Stay: Payer: Medicaid Other

## 2023-11-04 ENCOUNTER — Other Ambulatory Visit: Payer: Self-pay | Admitting: Radiology

## 2023-11-04 ENCOUNTER — Ambulatory Visit
Admission: RE | Admit: 2023-11-04 | Discharge: 2023-11-04 | Disposition: A | Discharge status: Home / Self Care | Payer: Medicaid Other | Source: Ambulatory Visit | Attending: Radiation Oncology | Admitting: Radiation Oncology

## 2023-11-04 VITALS — BP 108/80 | HR 91 | Temp 99.1°F | Resp 16

## 2023-11-04 DIAGNOSIS — Z510 Encounter for antineoplastic radiation therapy: Secondary | ICD-10-CM | POA: Diagnosis not present

## 2023-11-04 DIAGNOSIS — C50012 Malignant neoplasm of nipple and areola, left female breast: Secondary | ICD-10-CM

## 2023-11-04 DIAGNOSIS — C50011 Malignant neoplasm of nipple and areola, right female breast: Secondary | ICD-10-CM

## 2023-11-04 LAB — RAD ONC ARIA SESSION SUMMARY
Course Elapsed Days: 28
Plan Fractions Treated to Date: 18
Plan Prescribed Dose Per Fraction: 2 Gy
Plan Total Fractions Prescribed: 25
Plan Total Prescribed Dose: 50 Gy
Reference Point Dosage Given to Date: 36 Gy
Reference Point Session Dosage Given: 2 Gy
Session Number: 18

## 2023-11-04 MED ORDER — SODIUM CHLORIDE 0.9% FLUSH: 10.0000 mL | INTRAVENOUS | Status: DC | PRN

## 2023-11-04 MED ORDER — LIDOCAINE VISCOUS HCL 2 % MT SOLN: 15.0000 mL | OROMUCOSAL | 1 refills | Status: DC | PRN

## 2023-11-04 MED ORDER — HEPARIN SOD (PORK) LOCK FLUSH 100 UNIT/ML IV SOLN: 500.0000 [IU] | Freq: Once | INTRAVENOUS | Status: DC | PRN

## 2023-11-07 ENCOUNTER — Other Ambulatory Visit (HOSPITAL_BASED_OUTPATIENT_CLINIC_OR_DEPARTMENT_OTHER): Payer: Self-pay

## 2023-11-07 ENCOUNTER — Other Ambulatory Visit: Payer: Self-pay | Admitting: Radiation Oncology

## 2023-11-07 ENCOUNTER — Other Ambulatory Visit: Payer: Self-pay

## 2023-11-07 ENCOUNTER — Ambulatory Visit: Payer: Medicaid Other

## 2023-11-07 ENCOUNTER — Inpatient Hospital Stay: Payer: Medicaid Other

## 2023-11-07 ENCOUNTER — Ambulatory Visit
Admission: RE | Admit: 2023-11-07 | Discharge: 2023-11-07 | Disposition: A | Discharge status: Home / Self Care | Payer: Medicaid Other | Source: Ambulatory Visit | Attending: Radiation Oncology | Admitting: Radiation Oncology

## 2023-11-07 ENCOUNTER — Ambulatory Visit: Admission: RE | Admit: 2023-11-07 | Payer: Medicaid Other | Source: Ambulatory Visit

## 2023-11-07 ENCOUNTER — Other Ambulatory Visit (HOSPITAL_COMMUNITY): Payer: Self-pay

## 2023-11-07 DIAGNOSIS — C770 Secondary and unspecified malignant neoplasm of lymph nodes of head, face and neck: Secondary | ICD-10-CM

## 2023-11-07 DIAGNOSIS — Z510 Encounter for antineoplastic radiation therapy: Secondary | ICD-10-CM | POA: Diagnosis not present

## 2023-11-07 LAB — BASIC METABOLIC PANEL - CANCER CENTER ONLY
Anion gap: 8 (ref 5–15)
BUN: 11 mg/dL (ref 6–20)
CO2: 24 mmol/L (ref 22–32)
Calcium: 8.8 mg/dL — ABNORMAL LOW (ref 8.9–10.3)
Chloride: 101 mmol/L (ref 98–111)
Creatinine: 0.71 mg/dL (ref 0.44–1.00)
GFR, Estimated: >60 mL/min (ref >60)
Glucose, Bld: 164 mg/dL — ABNORMAL HIGH (ref 70–99)
Potassium: 4.3 mmol/L (ref 3.5–5.1)
Sodium: 133 mmol/L — ABNORMAL LOW (ref 135–145)

## 2023-11-07 LAB — CBC WITH DIFFERENTIAL (CANCER CENTER ONLY)
Abs Immature Granulocytes: 0.02 10*3/uL (ref 0.00–0.07)
Basophils Absolute: 0 10*3/uL (ref 0.0–0.1)
Basophils Relative: 0 %
Eosinophils Absolute: 0 10*3/uL (ref 0.0–0.5)
Eosinophils Relative: 1 %
HCT: 31.8 % — ABNORMAL LOW (ref 36.0–46.0)
Hemoglobin: 11 g/dL — ABNORMAL LOW (ref 12.0–15.0)
Immature Granulocytes: 1 %
Lymphocytes Relative: 15 %
Lymphs Abs: 0.5 10*3/uL — ABNORMAL LOW (ref 0.7–4.0)
MCH: 31.1 pg (ref 26.0–34.0)
MCHC: 34.6 g/dL (ref 30.0–36.0)
MCV: 89.8 fL (ref 80.0–100.0)
Monocytes Absolute: 0.3 10*3/uL (ref 0.1–1.0)
Monocytes Relative: 10 %
Neutro Abs: 2.4 10*3/uL (ref 1.7–7.7)
Neutrophils Relative %: 73 %
Platelet Count: 204 10*3/uL (ref 150–400)
RBC: 3.54 MIL/uL — ABNORMAL LOW (ref 3.87–5.11)
RDW: 16 % — ABNORMAL HIGH (ref 11.5–15.5)
WBC Count: 3.3 10*3/uL — ABNORMAL LOW (ref 4.0–10.5)
nRBC: 0 % (ref 0.0–0.2)

## 2023-11-07 MED ORDER — SODIUM CHLORIDE 0.9% FLUSH
10.0000 mL | Freq: Once | INTRAVENOUS | Status: AC
  Administered 2023-11-07: 10 mL via INTRAVENOUS

## 2023-11-07 MED ORDER — HYDROCODONE-ACETAMINOPHEN 7.5-325 MG/15ML PO SOLN
10.0000 mL | ORAL | 0 refills | Status: DC | PRN
  Filled 2023-11-07: qty 473, 8d supply, fill #0

## 2023-11-07 MED ORDER — HYDROCODONE-ACETAMINOPHEN 7.5-325 MG/15ML PO SOLN: 10.0000 mL | ORAL | 0 refills | Status: DC | PRN

## 2023-11-07 MED ORDER — HYDROMORPHONE HCL 1 MG/ML IJ SOLN
INTRAMUSCULAR | Status: AC
  Filled 2023-11-07: qty 1

## 2023-11-07 MED ORDER — HEPARIN SOD (PORK) LOCK FLUSH 100 UNIT/ML IV SOLN
500.0000 [IU] | Freq: Once | INTRAVENOUS | Status: AC
  Administered 2023-11-07: 500 [IU] via INTRAVENOUS

## 2023-11-07 MED ORDER — HYDROMORPHONE HCL 1 MG/ML IJ SOLN
1.0000 mg | Freq: Once | INTRAMUSCULAR | Status: AC
Start: 2023-11-07 — End: 2023-11-07
  Administered 2023-11-07: 1 mg via INTRAVENOUS

## 2023-11-07 MED ORDER — HYDROMORPHONE HCL 1 MG/ML IJ SOLN
0.5000 mg | Freq: Once | INTRAMUSCULAR | Status: AC
  Administered 2023-11-07: 0.5 mg via INTRAVENOUS
  Filled 2023-11-07: qty 1

## 2023-11-07 MED ORDER — SODIUM CHLORIDE 0.9 % IV SOLN
INTRAVENOUS | Status: AC
Start: 2023-11-07 — End: 2023-11-07

## 2023-11-07 NOTE — Patient Instructions (Signed)

## 2023-11-07 NOTE — Addendum Note (Signed)
 Encounter addended by: Benard Halsted, LPN on: 1/61/0960 3:09 PM  Actions taken: Order Reconciliation Section accessed, Pharmacy for encounter modified

## 2023-11-08 ENCOUNTER — Other Ambulatory Visit (HOSPITAL_COMMUNITY): Payer: Self-pay

## 2023-11-08 ENCOUNTER — Ambulatory Visit: Payer: Medicaid Other

## 2023-11-08 ENCOUNTER — Encounter: Payer: Self-pay | Admitting: Hematology & Oncology

## 2023-11-09 ENCOUNTER — Ambulatory Visit: Payer: Medicaid Other

## 2023-11-10 ENCOUNTER — Ambulatory Visit
Admission: RE | Admit: 2023-11-10 | Discharge: 2023-11-10 | Disposition: A | Discharge status: Home / Self Care | Payer: Medicaid Other | Source: Ambulatory Visit | Attending: Radiation Oncology | Admitting: Radiation Oncology

## 2023-11-10 DIAGNOSIS — Z510 Encounter for antineoplastic radiation therapy: Secondary | ICD-10-CM | POA: Diagnosis not present

## 2023-11-10 DIAGNOSIS — C770 Secondary and unspecified malignant neoplasm of lymph nodes of head, face and neck: Secondary | ICD-10-CM

## 2023-11-10 MED ORDER — SILVER SULFADIAZINE 1 % EX CREA: TOPICAL_CREAM | Freq: Every day | CUTANEOUS | Status: DC

## 2023-11-11 ENCOUNTER — Ambulatory Visit: Payer: Medicaid Other

## 2023-11-11 ENCOUNTER — Encounter: Payer: Self-pay | Admitting: Hematology & Oncology

## 2023-11-11 ENCOUNTER — Other Ambulatory Visit: Payer: Self-pay | Admitting: Radiology

## 2023-11-11 ENCOUNTER — Other Ambulatory Visit (HOSPITAL_BASED_OUTPATIENT_CLINIC_OR_DEPARTMENT_OTHER): Payer: Self-pay

## 2023-11-11 ENCOUNTER — Telehealth: Payer: Self-pay

## 2023-11-11 ENCOUNTER — Other Ambulatory Visit: Payer: Self-pay

## 2023-11-11 MED ORDER — HYDROCODONE-ACETAMINOPHEN 7.5-325 MG/15ML PO SOLN
10.0000 mL | ORAL | 0 refills | Status: DC | PRN
  Filled 2023-11-11: qty 473, 8d supply, fill #0
  Filled 2023-11-14: qty 400, 7d supply, fill #0
  Filled 2023-11-14: qty 340, 6d supply, fill #0

## 2023-11-11 NOTE — Telephone Encounter (Signed)
Patient called in requesting refill on hydrocodone-apap 7.5-325mg /6ml. Patient requesting medication be sent to [facility] outpatient pharmacy.

## 2023-11-11 NOTE — Progress Notes (Signed)
Patient was seen yesterday by Dr. Roselind Messier and stated she does not need a Hycet refill. Patient called today requesting a refill since the pharmacy will be closed on Sunday and she will not have enough to get her through the weekend. Rx has been sent to her preferred pharmacy to ensure continuity of medication.     Bryan Lemma, PA-C

## 2023-11-14 ENCOUNTER — Ambulatory Visit: Payer: Medicaid Other

## 2023-11-14 ENCOUNTER — Other Ambulatory Visit (HOSPITAL_BASED_OUTPATIENT_CLINIC_OR_DEPARTMENT_OTHER): Payer: Self-pay

## 2023-11-14 ENCOUNTER — Encounter: Payer: Self-pay | Admitting: Hematology & Oncology

## 2023-11-14 ENCOUNTER — Other Ambulatory Visit: Payer: Self-pay

## 2023-11-14 ENCOUNTER — Other Ambulatory Visit (HOSPITAL_COMMUNITY): Payer: Self-pay

## 2023-11-14 ENCOUNTER — Telehealth: Payer: Self-pay

## 2023-11-14 NOTE — Telephone Encounter (Signed)
Patient called to state that the sores on her neck are healing but is afraid to get treatment until they have completely healed. She has radiation treatment today at 1430. Her final treatment is scheduled for 11/22/2023. Please advise.

## 2023-11-15 ENCOUNTER — Ambulatory Visit: Payer: Medicaid Other

## 2023-11-16 ENCOUNTER — Ambulatory Visit
Admission: RE | Admit: 2023-11-16 | Discharge: 2023-11-16 | Disposition: A | Discharge status: Home / Self Care | Payer: Medicaid Other | Source: Ambulatory Visit | Attending: Radiation Oncology | Admitting: Radiation Oncology

## 2023-11-16 ENCOUNTER — Other Ambulatory Visit: Payer: Self-pay

## 2023-11-16 DIAGNOSIS — Z510 Encounter for antineoplastic radiation therapy: Secondary | ICD-10-CM | POA: Diagnosis not present

## 2023-11-16 LAB — RAD ONC ARIA SESSION SUMMARY
Course Elapsed Days: 40
Plan Fractions Treated to Date: 19
Plan Prescribed Dose Per Fraction: 2 Gy
Plan Total Fractions Prescribed: 25
Plan Total Prescribed Dose: 50 Gy
Reference Point Dosage Given to Date: 38 Gy
Reference Point Session Dosage Given: 2 Gy
Session Number: 19

## 2023-11-17 ENCOUNTER — Ambulatory Visit: Payer: Medicaid Other

## 2023-11-17 ENCOUNTER — Other Ambulatory Visit: Payer: Self-pay

## 2023-11-17 ENCOUNTER — Ambulatory Visit
Admission: RE | Admit: 2023-11-17 | Discharge: 2023-11-17 | Disposition: A | Discharge status: Home / Self Care | Payer: Medicaid Other | Source: Ambulatory Visit | Attending: Radiation Oncology | Admitting: Radiation Oncology

## 2023-11-17 DIAGNOSIS — Z510 Encounter for antineoplastic radiation therapy: Secondary | ICD-10-CM | POA: Diagnosis not present

## 2023-11-17 LAB — RAD ONC ARIA SESSION SUMMARY
Course Elapsed Days: 41
Plan Fractions Treated to Date: 20
Plan Prescribed Dose Per Fraction: 2 Gy
Plan Total Fractions Prescribed: 25
Plan Total Prescribed Dose: 50 Gy
Reference Point Dosage Given to Date: 40 Gy
Reference Point Session Dosage Given: 2 Gy
Session Number: 20

## 2023-11-18 ENCOUNTER — Other Ambulatory Visit: Payer: Self-pay

## 2023-11-18 ENCOUNTER — Ambulatory Visit: Payer: Medicaid Other

## 2023-11-18 ENCOUNTER — Ambulatory Visit
Admission: RE | Admit: 2023-11-18 | Discharge: 2023-11-18 | Disposition: A | Discharge status: Home / Self Care | Payer: Medicaid Other | Source: Ambulatory Visit | Attending: Radiation Oncology | Admitting: Radiation Oncology

## 2023-11-18 DIAGNOSIS — Z510 Encounter for antineoplastic radiation therapy: Secondary | ICD-10-CM | POA: Diagnosis not present

## 2023-11-18 LAB — RAD ONC ARIA SESSION SUMMARY
Course Elapsed Days: 42
Plan Fractions Treated to Date: 21
Plan Prescribed Dose Per Fraction: 2 Gy
Plan Total Fractions Prescribed: 25
Plan Total Prescribed Dose: 50 Gy
Reference Point Dosage Given to Date: 42 Gy
Reference Point Session Dosage Given: 2 Gy
Session Number: 21

## 2023-11-19 ENCOUNTER — Other Ambulatory Visit: Payer: Self-pay | Admitting: Physician Assistant

## 2023-11-19 DIAGNOSIS — F319 Bipolar disorder, unspecified: Secondary | ICD-10-CM

## 2023-11-21 ENCOUNTER — Other Ambulatory Visit: Payer: Self-pay

## 2023-11-21 ENCOUNTER — Ambulatory Visit
Admission: RE | Admit: 2023-11-21 | Discharge: 2023-11-21 | Disposition: A | Discharge status: Home / Self Care | Payer: Medicaid Other | Source: Ambulatory Visit | Attending: Radiation Oncology | Admitting: Radiation Oncology

## 2023-11-21 ENCOUNTER — Encounter: Payer: Self-pay | Admitting: Nutrition

## 2023-11-21 DIAGNOSIS — Z510 Encounter for antineoplastic radiation therapy: Secondary | ICD-10-CM | POA: Diagnosis not present

## 2023-11-21 LAB — RAD ONC ARIA SESSION SUMMARY
Course Elapsed Days: 45
Plan Fractions Treated to Date: 22
Plan Prescribed Dose Per Fraction: 2 Gy
Plan Total Fractions Prescribed: 25
Plan Total Prescribed Dose: 50 Gy
Reference Point Dosage Given to Date: 44 Gy
Reference Point Session Dosage Given: 2 Gy
Session Number: 22

## 2023-11-22 ENCOUNTER — Other Ambulatory Visit: Payer: Self-pay

## 2023-11-22 ENCOUNTER — Ambulatory Visit
Admission: RE | Admit: 2023-11-22 | Discharge: 2023-11-22 | Disposition: A | Discharge status: Home / Self Care | Payer: Medicaid Other | Source: Ambulatory Visit | Attending: Radiation Oncology | Admitting: Radiation Oncology

## 2023-11-22 ENCOUNTER — Inpatient Hospital Stay (HOSPITAL_BASED_OUTPATIENT_CLINIC_OR_DEPARTMENT_OTHER): Payer: Medicaid Other | Admitting: Hematology & Oncology

## 2023-11-22 ENCOUNTER — Other Ambulatory Visit (HOSPITAL_BASED_OUTPATIENT_CLINIC_OR_DEPARTMENT_OTHER): Payer: Self-pay

## 2023-11-22 ENCOUNTER — Inpatient Hospital Stay: Payer: Medicaid Other

## 2023-11-22 ENCOUNTER — Ambulatory Visit: Payer: Medicaid Other

## 2023-11-22 ENCOUNTER — Other Ambulatory Visit: Payer: Self-pay | Admitting: Hematology & Oncology

## 2023-11-22 ENCOUNTER — Encounter: Payer: Self-pay | Admitting: Hematology & Oncology

## 2023-11-22 VITALS — BP 105/58 | HR 99 | Temp 98.9°F | Resp 18 | Ht 62.0 in | Wt 175.0 lb

## 2023-11-22 DIAGNOSIS — Z86718 Personal history of other venous thrombosis and embolism: Secondary | ICD-10-CM

## 2023-11-22 DIAGNOSIS — C50012 Malignant neoplasm of nipple and areola, left female breast: Secondary | ICD-10-CM

## 2023-11-22 DIAGNOSIS — Z95828 Presence of other vascular implants and grafts: Secondary | ICD-10-CM

## 2023-11-22 DIAGNOSIS — Z510 Encounter for antineoplastic radiation therapy: Secondary | ICD-10-CM | POA: Diagnosis not present

## 2023-11-22 DIAGNOSIS — G620 Drug-induced polyneuropathy: Secondary | ICD-10-CM | POA: Diagnosis not present

## 2023-11-22 DIAGNOSIS — C50011 Malignant neoplasm of nipple and areola, right female breast: Secondary | ICD-10-CM | POA: Diagnosis not present

## 2023-11-22 DIAGNOSIS — R220 Localized swelling, mass and lump, head: Secondary | ICD-10-CM

## 2023-11-22 DIAGNOSIS — T451X5A Adverse effect of antineoplastic and immunosuppressive drugs, initial encounter: Secondary | ICD-10-CM

## 2023-11-22 DIAGNOSIS — C770 Secondary and unspecified malignant neoplasm of lymph nodes of head, face and neck: Secondary | ICD-10-CM

## 2023-11-22 DIAGNOSIS — I82C12 Acute embolism and thrombosis of left internal jugular vein: Secondary | ICD-10-CM

## 2023-11-22 LAB — URINALYSIS, COMPLETE (UACMP) WITH MICROSCOPIC
Bilirubin Urine: NEGATIVE
Glucose, UA: NEGATIVE mg/dL
Hgb urine dipstick: NEGATIVE
Ketones, ur: NEGATIVE mg/dL
Leukocytes,Ua: NEGATIVE
Nitrite: NEGATIVE
Protein, ur: NEGATIVE mg/dL
Specific Gravity, Urine: 1.02 (ref 1.005–1.030)
pH: 5.5 (ref 5.0–8.0)

## 2023-11-22 LAB — CBC WITH DIFFERENTIAL (CANCER CENTER ONLY)
Abs Immature Granulocytes: 0.01 10*3/uL (ref 0.00–0.07)
Basophils Absolute: 0 10*3/uL (ref 0.0–0.1)
Basophils Relative: 0 %
Eosinophils Absolute: 0.1 10*3/uL (ref 0.0–0.5)
Eosinophils Relative: 5 %
HCT: 31.3 % — ABNORMAL LOW (ref 36.0–46.0)
Hemoglobin: 10.6 g/dL — ABNORMAL LOW (ref 12.0–15.0)
Immature Granulocytes: 0 %
Lymphocytes Relative: 29 %
Lymphs Abs: 0.8 10*3/uL (ref 0.7–4.0)
MCH: 32.5 pg (ref 26.0–34.0)
MCHC: 33.9 g/dL (ref 30.0–36.0)
MCV: 96 fL (ref 80.0–100.0)
Monocytes Absolute: 0.5 10*3/uL (ref 0.1–1.0)
Monocytes Relative: 18 %
Neutro Abs: 1.3 10*3/uL — ABNORMAL LOW (ref 1.7–7.7)
Neutrophils Relative %: 48 %
Platelet Count: 149 10*3/uL — ABNORMAL LOW (ref 150–400)
RBC: 3.26 MIL/uL — ABNORMAL LOW (ref 3.87–5.11)
RDW: 18.6 % — ABNORMAL HIGH (ref 11.5–15.5)
WBC Count: 2.8 10*3/uL — ABNORMAL LOW (ref 4.0–10.5)
nRBC: 0 % (ref 0.0–0.2)

## 2023-11-22 LAB — CMP (CANCER CENTER ONLY)
ALT: 7 U/L (ref 0–44)
AST: 9 U/L — ABNORMAL LOW (ref 15–41)
Albumin: 4 g/dL (ref 3.5–5.0)
Alkaline Phosphatase: 59 U/L (ref 38–126)
Anion gap: 7 (ref 5–15)
BUN: 20 mg/dL (ref 6–20)
CO2: 25 mmol/L (ref 22–32)
Calcium: 9.2 mg/dL (ref 8.9–10.3)
Chloride: 101 mmol/L (ref 98–111)
Creatinine: 0.77 mg/dL (ref 0.44–1.00)
GFR, Estimated: >60 mL/min (ref >60)
Glucose, Bld: 196 mg/dL — ABNORMAL HIGH (ref 70–99)
Potassium: 4.6 mmol/L (ref 3.5–5.1)
Sodium: 133 mmol/L — ABNORMAL LOW (ref 135–145)
Total Bilirubin: 0.3 mg/dL (ref 0.0–1.2)
Total Protein: 6.1 g/dL — ABNORMAL LOW (ref 6.5–8.1)

## 2023-11-22 LAB — RAD ONC ARIA SESSION SUMMARY
Course Elapsed Days: 46
Plan Fractions Treated to Date: 23
Plan Prescribed Dose Per Fraction: 2 Gy
Plan Total Fractions Prescribed: 25
Plan Total Prescribed Dose: 50 Gy
Reference Point Dosage Given to Date: 46 Gy
Reference Point Session Dosage Given: 2 Gy
Session Number: 23

## 2023-11-22 LAB — LACTATE DEHYDROGENASE: LDH: 121 U/L (ref 98–192)

## 2023-11-22 MED ORDER — HEPARIN SOD (PORK) LOCK FLUSH 100 UNIT/ML IV SOLN
500.0000 [IU] | Freq: Once | INTRAVENOUS | Status: AC
  Administered 2023-11-22: 500 [IU] via INTRAVENOUS

## 2023-11-22 MED ORDER — SODIUM CHLORIDE 0.9% FLUSH
10.0000 mL | INTRAVENOUS | Status: DC | PRN
  Administered 2023-11-22: 10 mL via INTRAVENOUS

## 2023-11-22 MED ORDER — HYDROCODONE-ACETAMINOPHEN 7.5-325 MG PO TABS
1.0000 | ORAL_TABLET | Freq: Four times a day (QID) | ORAL | 0 refills | Status: DC | PRN
  Filled 2023-11-22: qty 120, 30d supply, fill #0

## 2023-11-22 NOTE — Progress Notes (Signed)
Hematology and Oncology Follow Up Visit  Ariana Herrera [id number] 1966/07/16 58 y.o. 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  She really looks quite good.  I must say I am very impressed with the fact that she has done so well with radiotherapy and chemotherapy for her breast cancer recurrence.  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  However, there is not as much swelling.  The skin actually is little bit soft and there is some wrinkling.  She still has a little bit of dysphagia and odynophagia.  However, she is eating.  She has had no problems with fever.  Unfortunate, she is still smoking quite a bit.  Will try to get her to cut back on smoking.  She has had no issues with bleeding.  There is been no change in bowel or bladder habits.  She has not been taking the right dose of Lovenox for the left internal jugular vein thrombus.  I told her she has to take 80 mg twice a Herrera.  Hopefully, once done with treatment, we will be able to switch her over to an oral agent.  Her last CA 27.29 was 72.  She has had no leg swelling.  Overall, I would say that her performance status is probably ECOG 1.   Medications:  Allergies as of 11/22/2023       Reactions   Lithium Other (See Comments)   Spinal fluid built up in brain   Dulaglutide Nausea And Vomiting, Other (See Comments)   TRULICITY   Penicillins Other (See Comments)    UNKNOWN CHILDHOOD REACTION        Medication List        Accurate as of November 22, 2023 10:30 AM. If you have any questions, ask your nurse or doctor.          STOP taking these medications    diazepam 2 MG tablet Commonly known as: VALIUM Stopped by: [name]   nitrofurantoin (macrocrystal-monohydrate) 100 MG capsule Commonly known as: Macrobid Stopped by: [name]   sulfamethoxazole-trimethoprim 800-160 MG tablet Commonly known as: BACTRIM DS Stopped by: [name]       TAKE these medications    Accu-Chek Guide test strip Generic drug: glucose blood 3 (three) times daily.   albuterol 1.25 MG/3ML nebulizer solution Commonly known as: ACCUNEB Take 1 ampule by nebulization every 6 (six) hours as needed for wheezing or shortness of breath.   albuterol 108 (90 Base) MCG/ACT inhaler Commonly known as: VENTOLIN HFA Inhale 2 puffs into the lungs every 6 (six) hours as needed for wheezing or shortness of breath.   clotrimazole 10 MG troche Commonly known as: MYCELEX Take 1 tablet (10 mg total) by mouth 5 (five) times daily. Slowly dissolve in mouth   cyclobenzaprine 5 MG tablet Commonly known as: FLEXERIL Take 5 mg by mouth 2 (two) times daily as needed for muscle spasms.  enoxaparin 80 MG/0.8ML injection Commonly known as: LOVENOX Inject 0.8 mLs (80 mg total) into the skin every 12 (twelve) hours.   famotidine 20 MG tablet Commonly known as: PEPCID Take 20 mg by mouth 2 (two) times daily.   fenofibrate 145 MG tablet Commonly known as: TRICOR Take 145 mg by mouth daily.   FreeStyle Libre 3 Sensor Misc Apply 1 sensor every 14 (fourteen) days.   gabapentin 300 MG capsule Commonly known as: NEURONTIN Take 1 capsule (300 mg total) by mouth 3 (three) times daily.   HYDROcodone-acetaminophen 7.5-325 MG tablet Commonly known as: Norco Take 1 tablet by mouth every 6 (six) hours as needed for moderate pain (pain score 4-6). What  changed: Another medication with the same name was removed. Continue taking this medication, and follow the directions you see here. Changed by: [name]   lidocaine 2 % solution Commonly known as: XYLOCAINE Use as directed 15 mLs in the mouth or throat every 4 (four) hours as needed for mouth pain.   lidocaine-prilocaine cream Commonly known as: EMLA APPLY TOPICALLY 1 APPLICATION AS NEEDED   linaclotide 290 MCG Caps capsule Commonly known as: Linzess Take 1 capsule (290 mcg total) by mouth daily before breakfast.   metFORMIN 1000 MG tablet Commonly known as: GLUCOPHAGE Take 1,000 mg by mouth 2 (two) times daily with a meal.   metoprolol tartrate 25 MG tablet Commonly known as: LOPRESSOR Take 1 tablet (25 mg total) by mouth 2 (two) times daily.   nitroGLYCERIN 0.4 MG SL tablet Commonly known as: NITROSTAT Place 1 tablet (0.4 mg total) under the tongue every 5 (five) minutes as needed for chest pain.   NovoLOG FlexPen 100 UNIT/ML FlexPen Generic drug: insulin aspart Inject 3 Units into the skin 3 (three) times daily with meals. What changed:  how much to take when to take this additional instructions   ondansetron 8 MG tablet Commonly known as: ZOFRAN Take 1 tablet (8 mg total) by mouth every 8 (eight) hours as needed for nausea or vomiting. START ON THE THIRD Herrera AFTER CHEMOTHERAPY.   oxybutynin 5 MG 24 hr tablet Commonly known as: DITROPAN-XL Take 1 tablet (5 mg total) by mouth at bedtime.   pantoprazole 40 MG tablet Commonly known as: PROTONIX Take 1 tablet (40 mg total) by mouth daily.   PEG 3350 17 g Pack Take 17 g by mouth daily as needed.   prochlorperazine 10 MG tablet Commonly known as: COMPAZINE Take 1 tablet (10 mg total) by mouth every 6 (six) hours as needed for nausea or vomiting (Zofran resumes 12/16).   QUEtiapine 400 MG tablet Commonly known as: SEROQUEL TAKE 2 TABLETS (800 MG TOTAL) BY MOUTH AT BEDTIME.   rosuvastatin 40 MG  tablet Commonly known as: CRESTOR Take 1 tablet (40 mg total) by mouth daily.   senna 8.6 MG Tabs tablet Commonly known as: SENOKOT Take 1 tablet (8.6 mg total) by mouth 2 (two) times daily.   Evaristo Bury FlexTouch 200 UNIT/ML FlexTouch Pen Generic drug: insulin degludec Inject 26 Units into the skin at bedtime.        Allergies:  Allergies  Allergen Reactions   Lithium Other (See Comments)    Spinal fluid built up in brain   Dulaglutide Nausea And Vomiting and Other (See Comments)    TRULICITY   Penicillins Other (See Comments)    UNKNOWN CHILDHOOD REACTION    Past Medical History, Surgical history, Social history, and Family History were reviewed and updated.  Review of  Systems: Review of Systems  Constitutional:  Positive for malaise/fatigue.  HENT: Negative.    Eyes: Negative.   Respiratory: Negative.    Cardiovascular:  Positive for leg swelling.  Gastrointestinal:  Positive for nausea.  Genitourinary: Negative.   Musculoskeletal:  Positive for joint pain and myalgias.  Skin: Negative.   Neurological:  Positive for tingling.  Endo/Heme/Allergies: Negative.   Psychiatric/Behavioral: Negative.       Physical Exam:  height is 5\' 2"  (1.575 m) and weight is 175 lb (79.4 kg). Her oral temperature is 98.9 F (37.2 C). Her blood pressure is 105/58 (abnormal) and her pulse is 99. Her respiration is 18 and oxygen saturation is 99%.   Wt Readings from Last 3 Encounters:  11/22/23 175 lb (79.4 kg)  10/31/23 180 lb 1.3 oz (81.7 kg)  10/27/23 180 lb 8 oz (81.9 kg)    Physical Exam Vitals reviewed.  Constitutional:      Comments: On her breast exam, the right breast is without masses, edema or erythema.  There is no right axillary adenopathy.  Her left breast is somewhat contracted from radiation and surgery.  It is somewhat firm.  It is somewhat swollen although still contracted.  It is tender.  I cannot palpate any adenopathy in the left axilla.  There is no left nipple  discharge.  HENT:     Head: Normocephalic and atraumatic.     Comments: She does have some swelling of the face.  This is more so on the left side.  She has some firmness on the left side of her neck.  There is some slight erythema.  I do not see any obvious thrush in the oral cavity. Eyes:     Pupils: Pupils are equal, round, and reactive to light.  Cardiovascular:     Rate and Rhythm: Normal rate and regular rhythm.     Heart sounds: Normal heart sounds.  Pulmonary:     Effort: Pulmonary effort is normal.     Breath sounds: Normal breath sounds.  Abdominal:     General: Bowel sounds are normal.     Palpations: Abdomen is soft.  Musculoskeletal:        General: No tenderness or deformity. Normal range of motion.     Cervical back: Normal range of motion.  Lymphadenopathy:     Cervical: No cervical adenopathy.  Skin:    General: Skin is warm and dry.     Findings: No erythema or rash.  Neurological:     Mental Status: She is alert and oriented to person, place, and time.  Psychiatric:        Behavior: Behavior normal.        Thought Content: Thought content normal.        Judgment: Judgment normal.    Lab Results  Component Value Date   WBC 2.8 (L) 11/22/2023   HGB 10.6 (L) 11/22/2023   HCT 31.3 (L) 11/22/2023   MCV 96.0 11/22/2023   PLT 149 (L) 11/22/2023   Lab Results  Component Value Date   FERRITIN 33 08/25/2023   IRON 61 08/25/2023   TIBC 400 08/25/2023   UIBC 339 08/25/2023   IRONPCTSAT 15 08/25/2023   Lab Results  Component Value Date   RETICCTPCT 4.0 (H) 08/05/2022   RBC 3.26 (L) 11/22/2023   No results found for: "KPAFRELGTCHN", "LAMBDASER", "KAPLAMBRATIO" No results found for: "IGGSERUM", "IGA", "IGMSERUM" No results found for: "TOTALPROTELP", "ALBUMINELP", "A1GS", "A2GS", "BETS", "BETA2SER", "GAMS", "MSPIKE", "SPEI"   Chemistry  Component Value Date/Time   NA 133 (L) 11/22/2023 0925   K 4.6 11/22/2023 0925   CL 101 11/22/2023 0925   CO2 25  11/22/2023 0925   BUN 20 11/22/2023 0925   CREATININE 0.77 11/22/2023 0925      Component Value Date/Time   CALCIUM 9.2 11/22/2023 0925   ALKPHOS 59 11/22/2023 0925   AST 9 (L) 11/22/2023 0925   ALT 7 11/22/2023 0925   BILITOT 0.3 11/22/2023 0925       Impression and Plan: Ariana Herrera is a very pleasant  58 yo caucasian female with recurrent ductal carcinoma of the left breast.  It is amazing that the recurrence is just been in the neck.  She will finish of the radiation this week.  I probably would not do any scans on her for at least 2 months.  However, we really need to get her on systemic chemotherapy so that we can try to prevent progression of disease systemically.  Given that she has triple negative disease, I would certainly use immunotherapy along with chemotherapy.  I would like to have her come back in a month.  At that point time, I will discuss with her how we can treat her systemically.  Again she will do Lovenox at 80 mg twice a Herrera.  We will probably check a Doppler of her left neck at some point couple months.  Hopefully, she will continue to try to cut back on smoking.    [name], MD 1/28/202510:30 AM

## 2023-11-22 NOTE — Patient Instructions (Signed)
Implanted [facility] Guide An implanted port is a device that is placed under the skin. It is usually placed in the chest. The device may vary based on the need. Implanted ports can be used to give IV medicine, to take blood, or to give fluids. You may have an implanted port if: You need IV medicine that would be irritating to the small veins in your hands or arms. You need IV medicines, such as chemotherapy, for a long period of time. You need IV nutrition for a long period of time. You may have fewer limitations when using a port than you would if you used other types of long-term IVs. You will also likely be able to return to normal activities after your incision heals. An implanted port has two main parts: Reservoir. The reservoir is the part where a needle is inserted to give medicines or draw blood. The reservoir is round. After the port is placed, it appears as a small, raised area under your skin. Catheter. The catheter is a small, thin tube that connects the reservoir to a vein. Medicine that is inserted into the reservoir goes into the catheter and then into the vein. How is my port accessed? To access your port: A numbing cream may be placed on the skin over the port site. Your health care provider will put on a mask and sterile gloves. The skin over your port will be cleaned carefully with a germ-killing soap and allowed to dry. Your health care provider will gently pinch the port and insert a needle into it. Your health care provider will check for a blood return to make sure the port is in the vein and is still working (patent). If your port needs to remain accessed to get medicine continuously (constant infusion), your health care provider will place a clear bandage (dressing) over the needle site. The dressing and needle will need to be changed every week, or as told by your health care provider. What is flushing? Flushing helps keep the port working. Follow instructions from your  health care provider about how and when to flush the port. Ports are usually flushed with saline solution or a medicine called heparin. The need for flushing will depend on how the port is used: If the port is only used from time to time to give medicines or draw blood, the port may need to be flushed: Before and after medicines have been given. Before and after blood has been drawn. As part of routine maintenance. Flushing may be recommended every 4-6 weeks. If a constant infusion is running, the port may not need to be flushed. Throw away any syringes in a disposal container that is meant for sharp items (sharps container). You can buy a sharps container from a pharmacy, or you can make one by using an empty hard plastic bottle with a cover. How long will my port stay implanted? The port can stay in for as long as your health care provider thinks it is needed. When it is time for the port to come out, a surgery will be done to remove it. The surgery will be similar to the procedure that was done to put the port in. Follow these instructions at home: Caring for your port and port site Flush your port as told by your health care provider. If you need an infusion over several days, follow instructions from your health care provider about how to take care of your port site. Make sure you: Change your  dressing as told by your health care provider. Wash your hands with soap and water for at least 20 seconds before and after you change your dressing. If soap and water are not available, use alcohol-based hand sanitizer. Place any used dressings or infusion bags into a plastic bag. Throw that bag in the trash. Keep the dressing that covers the needle clean and dry. Do not get it wet. Do not use scissors or sharp objects near the infusion tubing. Keep any external tubes clamped, unless they are being used. Check your port site every day for signs of infection. Check for: Redness, swelling, or  pain. Fluid or blood. Warmth. Pus or a bad smell. Protect the skin around the port site. Avoid wearing bra straps that rub or irritate the site. Protect the skin around your port from seat belts. Place a soft pad over your chest if needed. Bathe or shower as told by your health care provider. The site may get wet as long as you are not actively receiving an infusion. General instructions  Return to your normal activities as told by your health care provider. Ask your health care provider what activities are safe for you. Carry a medical alert card or wear a medical alert bracelet at all times. This will let health care providers know that you have an implanted port in case of an emergency. Where to find more information American Cancer Society: www.cancer.org American Society of Clinical Oncology: www.cancer.net Contact a health care provider if: You have a fever or chills. You have redness, swelling, or pain at the port site. You have fluid or blood coming from your port site. Your incision feels warm to the touch. You have pus or a bad smell coming from the port site. Summary Implanted ports are usually placed in the chest for long-term IV access. Follow instructions from your health care provider about flushing the port and changing bandages (dressings). Take care of the area around your port by avoiding clothing that puts pressure on the area, and by watching for signs of infection. Protect the skin around your port from seat belts. Place a soft pad over your chest if needed. Contact a health care provider if you have a fever or you have redness, swelling, pain, fluid, or a bad smell at the port site. This information is not intended to replace advice given to you by your health care provider. Make sure you discuss any questions you have with your health care provider. Document Revised: 04/14/2021 Document Reviewed: 04/14/2021 Elsevier Patient Education  2024 [employer].

## 2023-11-23 ENCOUNTER — Other Ambulatory Visit: Payer: Self-pay | Admitting: *Deleted

## 2023-11-23 ENCOUNTER — Telehealth: Payer: Self-pay

## 2023-11-23 ENCOUNTER — Encounter: Payer: Self-pay | Admitting: *Deleted

## 2023-11-23 ENCOUNTER — Other Ambulatory Visit: Payer: Self-pay

## 2023-11-23 ENCOUNTER — Ambulatory Visit
Admission: RE | Admit: 2023-11-23 | Discharge: 2023-11-23 | Disposition: A | Discharge status: Home / Self Care | Payer: Medicaid Other | Source: Ambulatory Visit | Attending: Radiation Oncology | Admitting: Radiation Oncology

## 2023-11-23 DIAGNOSIS — Z510 Encounter for antineoplastic radiation therapy: Secondary | ICD-10-CM | POA: Diagnosis not present

## 2023-11-23 LAB — RAD ONC ARIA SESSION SUMMARY
Course Elapsed Days: 47
Plan Fractions Treated to Date: 24
Plan Prescribed Dose Per Fraction: 2 Gy
Plan Total Fractions Prescribed: 25
Plan Total Prescribed Dose: 50 Gy
Reference Point Dosage Given to Date: 48 Gy
Reference Point Session Dosage Given: 2 Gy
Session Number: 24

## 2023-11-23 LAB — CANCER ANTIGEN 27.29: CA 27.29: 45.1 U/mL — ABNORMAL HIGH (ref 0.0–38.6)

## 2023-11-23 MED ORDER — SULFAMETHOXAZOLE-TRIMETHOPRIM 800-160 MG PO TABS: 1.0000 | ORAL_TABLET | Freq: Two times a day (BID) | ORAL | 0 refills | Status: DC

## 2023-11-23 NOTE — Telephone Encounter (Signed)
-----   Message from [name] sent at 11/23/2023  6:18 AM EST ----- Call and let her know that the tumor marker went from 72 down to 45.  Great job.  Cindee Lame

## 2023-11-23 NOTE — Telephone Encounter (Signed)
Advised via MyChart.

## 2023-11-24 ENCOUNTER — Telehealth: Payer: Self-pay | Admitting: *Deleted

## 2023-11-24 ENCOUNTER — Other Ambulatory Visit: Payer: Self-pay

## 2023-11-24 ENCOUNTER — Other Ambulatory Visit: Payer: Self-pay | Admitting: *Deleted

## 2023-11-24 ENCOUNTER — Ambulatory Visit: Payer: Medicaid Other

## 2023-11-24 ENCOUNTER — Ambulatory Visit
Admission: RE | Admit: 2023-11-24 | Discharge: 2023-11-24 | Disposition: A | Discharge status: Home / Self Care | Payer: Medicaid Other | Source: Ambulatory Visit | Attending: Radiation Oncology | Admitting: Radiation Oncology

## 2023-11-24 DIAGNOSIS — C770 Secondary and unspecified malignant neoplasm of lymph nodes of head, face and neck: Secondary | ICD-10-CM

## 2023-11-24 DIAGNOSIS — Z510 Encounter for antineoplastic radiation therapy: Secondary | ICD-10-CM | POA: Diagnosis not present

## 2023-11-24 DIAGNOSIS — N390 Urinary tract infection, site not specified: Secondary | ICD-10-CM

## 2023-11-24 LAB — RAD ONC ARIA SESSION SUMMARY
Course Elapsed Days: 48
Plan Fractions Treated to Date: 25
Plan Prescribed Dose Per Fraction: 2 Gy
Plan Total Fractions Prescribed: 25
Plan Total Prescribed Dose: 50 Gy
Reference Point Dosage Given to Date: 50 Gy
Reference Point Session Dosage Given: 2 Gy
Session Number: 25

## 2023-11-24 LAB — URINE CULTURE: Culture: 80000 — AB

## 2023-11-24 MED ORDER — NITROFURANTOIN MONOHYD MACRO 100 MG PO CAPS: 100.0000 mg | ORAL_CAPSULE | Freq: Two times a day (BID) | ORAL | 0 refills | Status: DC

## 2023-11-24 NOTE — Telephone Encounter (Signed)
This nurse left a message for patient that Dr. Myna Hidalgo is switching antibiotics. I have called the pharmacy and canceled the Bactrim-DS. Phoned in Macrobid 100 mg po bid x 7 days per Dr. Myna Hidalgo. Instructed the patient to call the office if she had any questions or concerns.

## 2023-11-24 NOTE — Telephone Encounter (Signed)
-----   Message from [name] sent at 11/24/2023  9:35 AM EST ----- Of course, the E. coli is resistant to the Bactrim.  We needed to call in Macrobid 100 mg p.o. twice daily x 7 days.  Thanks.  Cindee Lame

## 2023-11-25 NOTE — Radiation Completion Notes (Addendum)
  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      ==========DELIVERED PLANS==========  First Treatment Date: 2023-10-07 Last Treatment Date: 2023-11-24   Plan Name: HN_L_Neck Site: Neck Left Technique: 3D Mode: Photon Dose Per Fraction: 2 Gy Prescribed Dose (Delivered / Prescribed): 50 Gy / 50 Gy Prescribed Fxs (Delivered / Prescribed): 25 / 25     ====================================   The patient tolerated radiation. She developed fatigue, 7/10 neck pain, and anticipated skin changes in the treatment field.   The patient will return in one month and will continue follow up with Dr. Myna Hidalgo as well.      Joyice Faster, PA-C

## 2023-11-29 ENCOUNTER — Other Ambulatory Visit (HOSPITAL_BASED_OUTPATIENT_CLINIC_OR_DEPARTMENT_OTHER): Payer: Self-pay

## 2023-11-29 ENCOUNTER — Other Ambulatory Visit: Payer: Self-pay

## 2023-11-30 ENCOUNTER — Other Ambulatory Visit (HOSPITAL_BASED_OUTPATIENT_CLINIC_OR_DEPARTMENT_OTHER): Payer: Self-pay

## 2023-12-01 ENCOUNTER — Ambulatory Visit: Payer: Medicaid Other | Admitting: Physician Assistant

## 2023-12-01 ENCOUNTER — Encounter: Payer: Self-pay | Admitting: Physician Assistant

## 2023-12-01 ENCOUNTER — Other Ambulatory Visit (HOSPITAL_BASED_OUTPATIENT_CLINIC_OR_DEPARTMENT_OTHER): Payer: Self-pay

## 2023-12-01 VITALS — BP 125/64 | HR 108 | Temp 98.8°F | Ht 62.0 in | Wt 176.0 lb

## 2023-12-01 DIAGNOSIS — H9202 Otalgia, left ear: Secondary | ICD-10-CM | POA: Diagnosis not present

## 2023-12-01 DIAGNOSIS — Z23 Encounter for immunization: Secondary | ICD-10-CM

## 2023-12-01 DIAGNOSIS — J209 Acute bronchitis, unspecified: Secondary | ICD-10-CM

## 2023-12-01 DIAGNOSIS — N3000 Acute cystitis without hematuria: Secondary | ICD-10-CM | POA: Diagnosis not present

## 2023-12-01 MED ORDER — CEPHALEXIN 500 MG PO CAPS: 500.0000 mg | ORAL_CAPSULE | Freq: Two times a day (BID) | ORAL | 0 refills | Status: AC

## 2023-12-01 MED ORDER — BENZONATATE 100 MG PO CAPS: 100.0000 mg | ORAL_CAPSULE | Freq: Two times a day (BID) | ORAL | 0 refills | Status: DC | PRN

## 2023-12-01 NOTE — Progress Notes (Signed)
 Established patient visit   Patient: Ariana Herrera   DOB: [birth year]   58 y.o. Female  MRN: [id number] Visit Date: 12/01/2023  Today's healthcare provider: Manuelita Flatness, PA-C   Chief Complaint  Patient presents with   Ear Pain    Patient has had pain for over a month... states she does have bronchitis as well.  Fluid and popping... pain is making her throat sore    Subjective     Pt, who is s/p left neck radiation therapy, presents today with left ear pain and fullness, extending towards her throat. Concerned over ear infection.   She also reports a recent UTI, given macrobid , but she had an allergic reaction to this and d/c.   She also reports a worsening of her chronic cough, concerned she had bronchitis. She is using her albuterol  nebulizer at home.  Medications: Outpatient Medications Prior to Visit  Medication Sig Note   ACCU-CHEK GUIDE test strip 3 (three) times daily.    albuterol  (ACCUNEB ) 1.25 MG/3ML nebulizer solution Take 1 ampule by nebulization every 6 (six) hours as needed for wheezing or shortness of breath.    albuterol  (VENTOLIN  HFA) 108 (90 Base) MCG/ACT inhaler Inhale 2 puffs into the lungs every 6 (six) hours as needed for wheezing or shortness of breath.    clotrimazole  (MYCELEX ) 10 MG troche Take 1 tablet (10 mg total) by mouth 5 (five) times daily. Slowly dissolve in mouth    Continuous Glucose Sensor (FREESTYLE LIBRE 3 SENSOR) MISC Apply 1 sensor every 14 (fourteen) days.    cyclobenzaprine  (FLEXERIL ) 5 MG tablet Take 5 mg by mouth 2 (two) times daily as needed for muscle spasms.    enoxaparin  (LOVENOX ) 80 MG/0.8ML injection Inject 0.8 mLs (80 mg total) into the skin every 12 (twelve) hours.    famotidine  (PEPCID ) 20 MG tablet Take 20 mg by mouth 2 (two) times daily.    fenofibrate  (TRICOR ) 145 MG tablet Take 145 mg by mouth daily.    gabapentin  (NEURONTIN ) 300 MG capsule Take 1 capsule (300 mg total) by mouth 3 (three) times daily.     HYDROcodone -acetaminophen  (NORCO) 7.5-325 MG tablet Take 1 tablet by mouth every 6 (six) hours as needed for moderate pain (pain score 4-6).    insulin  aspart (NOVOLOG  FLEXPEN) 100 UNIT/ML FlexPen Inject 3 Units into the skin 3 (three) times daily with meals. (Patient taking differently: Inject 4-14 Units into the skin See admin instructions. Depending on BGL, inject 4-10 units into the skin after breakfast, 6-12 units after lunch, and 8-14 units after supper)    lidocaine  (XYLOCAINE ) 2 % solution Use as directed 15 mLs in the mouth or throat every 4 (four) hours as needed for mouth pain.    lidocaine -prilocaine  (EMLA ) cream APPLY TOPICALLY 1 APPLICATION AS NEEDED    linaclotide  (LINZESS ) 290 MCG CAPS capsule Take 1 capsule (290 mcg total) by mouth daily before breakfast.    metFORMIN  (GLUCOPHAGE ) 1000 MG tablet Take 1,000 mg by mouth 2 (two) times daily with a meal.    metoprolol  tartrate (LOPRESSOR ) 25 MG tablet Take 1 tablet (25 mg total) by mouth 2 (two) times daily.    nitroGLYCERIN  (NITROSTAT ) 0.4 MG SL tablet Place 1 tablet (0.4 mg total) under the tongue every 5 (five) minutes as needed for chest pain.    ondansetron  (ZOFRAN ) 8 MG tablet Take 1 tablet (8 mg total) by mouth every 8 (eight) hours as needed for nausea or vomiting. START ON THE THIRD DAY  AFTER CHEMOTHERAPY.    oxybutynin  (DITROPAN -XL) 5 MG 24 hr tablet Take 1 tablet (5 mg total) by mouth at bedtime.    pantoprazole  (PROTONIX ) 40 MG tablet Take 1 tablet (40 mg total) by mouth daily.    polyethylene glycol (MIRALAX  / GLYCOLAX ) 17 g packet Take 17 g by mouth daily as needed.    prochlorperazine  (COMPAZINE ) 10 MG tablet Take 1 tablet (10 mg total) by mouth every 6 (six) hours as needed for nausea or vomiting (Zofran  resumes 12/16).    QUEtiapine  (SEROQUEL ) 400 MG tablet TAKE 2 TABLETS (800 MG TOTAL) BY MOUTH AT BEDTIME.    rosuvastatin  (CRESTOR ) 40 MG tablet Take 1 tablet (40 mg total) by mouth daily.    senna (SENOKOT) 8.6 MG TABS  tablet Take 1 tablet (8.6 mg total) by mouth 2 (two) times daily.    TRESIBA FLEXTOUCH 200 UNIT/ML FlexTouch Pen Inject 26 Units into the skin at bedtime.    [DISCONTINUED] nitrofurantoin , macrocrystal-monohydrate, (MACROBID ) 100 MG capsule Take 1 capsule (100 mg total) by mouth 2 (two) times daily. 12/01/2023: itching   Facility-Administered Medications Prior to Visit  Medication Dose Route Frequency Provider   sodium chloride  flush (NS) 0.9 % injection 10 mL  10 mL Intravenous PRN Timmy Maude SAUNDERS, MD    Review of Systems  Constitutional:  Negative for fatigue and fever.  HENT:  Positive for ear pain.   Respiratory:  Positive for cough. Negative for shortness of breath.   Cardiovascular:  Negative for chest pain and leg swelling.  Gastrointestinal:  Negative for abdominal pain.  Neurological:  Negative for dizziness and headaches.       Objective    BP 125/64   Pulse (!) 108   Temp 98.8 F (37.1 C) (Oral)   Ht 5' 2 (1.575 m)   Wt 176 lb (79.8 kg)   LMP 10/04/2016 Comment: after procedure to stop bleeding  SpO2 98%   BMI 32.19 kg/m    Physical Exam Vitals reviewed.  Constitutional:      Appearance: She is not ill-appearing.  HENT:     Head: Normocephalic.     Left Ear: Tympanic membrane normal.     Ears:     Comments: Some firmness to L ear, and erythema to L ear canal. Mild injection of left TM without fluid, bulging. Eyes:     Conjunctiva/sclera: Conjunctivae normal.  Neck:     Comments: L neck is erythematous/hyperpigmented, but no edema or firmness present Cardiovascular:     Rate and Rhythm: Normal rate.  Pulmonary:     Effort: Pulmonary effort is normal. No respiratory distress.     Breath sounds: Wheezing present.  Neurological:     General: No focal deficit present.     Mental Status: She is alert and oriented to person, place, and time.  Psychiatric:        Mood and Affect: Mood normal.        Behavior: Behavior normal.     No results found for  any visits on 12/01/23.  Assessment & Plan    Left ear pain - seems to be a similar radiation side effect, no sign of infection today.  Acute cystitis without hematuria Given allergic rxn to macrobid -- adding to allergy list and swapping for keflex  based on culture sensitivity -     Cephalexin ; Take 1 capsule (500 mg total) by mouth 2 (two) times daily for 5 days.  Dispense: 10 capsule; Refill: 0  Acute bronchitis, unspecified organism Wheezing b/l,  cont nebs at home, rx tessalon  for cough -     Benzonatate ; Take 1 capsule (100 mg total) by mouth 2 (two) times daily as needed.  Dispense: 20 capsule; Refill: 0  Immunization due -     Influenza, MDCK, trivalent, PF(Flucelvax egg-free)  Return if symptoms worsen or fail to improve.       Manuelita Flatness, PA-C  [facility] [phone] (phone) [phone] (fax)  [facility]

## 2023-12-05 ENCOUNTER — Encounter: Payer: Self-pay | Admitting: Internal Medicine

## 2023-12-05 ENCOUNTER — Ambulatory Visit (INDEPENDENT_AMBULATORY_CARE_PROVIDER_SITE_OTHER): Payer: Medicaid Other | Admitting: Internal Medicine

## 2023-12-05 VITALS — BP 104/60 | HR 100 | Ht 62.0 in | Wt 177.0 lb

## 2023-12-05 DIAGNOSIS — K649 Unspecified hemorrhoids: Secondary | ICD-10-CM

## 2023-12-05 DIAGNOSIS — K449 Diaphragmatic hernia without obstruction or gangrene: Secondary | ICD-10-CM | POA: Diagnosis not present

## 2023-12-05 DIAGNOSIS — K6289 Other specified diseases of anus and rectum: Secondary | ICD-10-CM

## 2023-12-05 DIAGNOSIS — C50011 Malignant neoplasm of nipple and areola, right female breast: Secondary | ICD-10-CM

## 2023-12-05 DIAGNOSIS — R112 Nausea with vomiting, unspecified: Secondary | ICD-10-CM | POA: Diagnosis not present

## 2023-12-05 DIAGNOSIS — K5900 Constipation, unspecified: Secondary | ICD-10-CM

## 2023-12-05 DIAGNOSIS — K219 Gastro-esophageal reflux disease without esophagitis: Secondary | ICD-10-CM | POA: Diagnosis not present

## 2023-12-05 MED ORDER — LINACLOTIDE 290 MCG PO CAPS: 290.0000 ug | ORAL_CAPSULE | Freq: Every day | ORAL | 3 refills | Status: DC

## 2023-12-05 MED ORDER — ONDANSETRON HCL 8 MG PO TABS: 8.0000 mg | ORAL_TABLET | Freq: Three times a day (TID) | ORAL | 1 refills | Status: DC | PRN

## 2023-12-05 MED ORDER — PANTOPRAZOLE SODIUM 40 MG PO TBEC: 40.0000 mg | DELAYED_RELEASE_TABLET | Freq: Every day | ORAL | 1 refills | Status: DC

## 2023-12-05 NOTE — Progress Notes (Signed)
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 She just completed radiation therapy to her chest. She was on a soft food diet while undergoing radiation. There are plans for her to start chemotherapy at the end of the month. Denies dysphagia currently. Her reflux has been under good control. Protonix is helping a lot. The Pepcid did not really help. The Linzess has helped a lot with her constipation. She is having one BM every week. She is straining a lot when having a BM. Her hemorrhoids are bothering her in terms of rectal pain. She is still on narcotics regularly. She is having some N&V. Zofran seems effective for keeping her N&V under control..   Wt Readings from Last 3 Encounters:  12/05/23 177 lb (80.3 kg)  12/01/23 176 lb (79.8 kg)  11/22/23 175 lb (79.4 kg)   Past Medical History:  Diagnosis Date   Anginal pain (HCC)    Anxiety    Arthritis    Asthma    Bipolar 1 disorder (HCC)    Bipolar disease, chronic (HCC)    Breast cancer in female (HCC)    2019 and recurrent in 2023 to right breast, right axillary, and left neck   Coronary artery disease    Deep vein blood clot of right lower extremity (HCC)    Diabetes mellitus type 2 in obese    Goals of care, counseling/discussion 11/23/2021   Hyperlipidemia    Lymphedema    MI (myocardial infarction) (HCC)    was in [place]   Obesity    Pancreatitis    Personal history of chemotherapy    Personal history of radiation therapy    Sleep apnea      Past Surgical History:  Procedure Laterality Date   BREAST BIOPSY Left  10/03/2023   times 2   BREAST LUMPECTOMY Left 03/2019   CATARACT EXTRACTION Bilateral    CHOLECYSTECTOMY     IR IMAGING GUIDED PORT INSERTION  12/15/2021   IR PATIENT EVAL TECH 0-60 MINS  10/06/2023   IR REMOVAL TUN ACCESS W/ PORT W/O FL MOD SED  12/20/2019   IR US GUIDE BX ASP/DRAIN  11/02/2021   LEFT HEART CATH AND CORONARY ANGIOGRAPHY N/A 08/22/2020   Procedure: LEFT HEART CATH AND CORONARY ANGIOGRAPHY;  Surgeon: Kathleene Hazel, MD;  Location: [facility];  Service: Cardiovascular;  Laterality: N/A;   TRIGGER FINGER RELEASE Bilateral    x 5   TUBAL LIGATION     UMBILICAL HERNIA REPAIR     x 2   uterine ablation     Family History  Problem Relation Age of Onset   Heart attack Mother 57   Diabetes Mother    Breast cancer Mother    Breast cancer Cousin    Colon cancer Neg Hx    Esophageal cancer Neg Hx    Pancreatic cancer Neg Hx    Stomach cancer Neg Hx    Social History   Tobacco Use   Smoking status: Every Day    Current packs/day: 1.00    Average packs/day: 1 pack/day for 39.1 years (39.1 ttl pk-yrs)    Types: Cigarettes  Start date: 50   Smokeless tobacco: Never  Vaping Use   Vaping status: Never Used  Substance Use Topics   Alcohol use: Never   Drug use: Yes    Types: Marijuana    Comment: occasionally   Current Outpatient Medications  Medication Sig Dispense Refill   ACCU-CHEK GUIDE test strip 3 (three) times daily.     albuterol (ACCUNEB) 1.25 MG/3ML nebulizer solution Take 1 ampule by nebulization every 6 (six) hours as needed for wheezing or shortness of breath.     albuterol (VENTOLIN HFA) 108 (90 Base) MCG/ACT inhaler Inhale 2 puffs into the lungs every 6 (six) hours as needed for wheezing or shortness of breath. 18 g 2   benzonatate (TESSALON) 100 MG capsule Take 1 capsule (100 mg total) by mouth 2 (two) times daily as needed. 20 capsule 0   cephALEXin (KEFLEX) 500 MG capsule Take 1 capsule (500 mg total) by mouth 2 (two) times  daily for 5 days. 10 capsule 0   clotrimazole (MYCELEX) 10 MG troche Take 1 tablet (10 mg total) by mouth 5 (five) times daily. Slowly dissolve in mouth 150 tablet 0   Continuous Glucose Sensor (FREESTYLE LIBRE 3 SENSOR) MISC Apply 1 sensor every 14 (fourteen) days. 2 each 12   cyclobenzaprine (FLEXERIL) 5 MG tablet Take 5 mg by mouth 2 (two) times daily as needed for muscle spasms.     enoxaparin (LOVENOX) 80 MG/0.8ML injection Inject 0.8 mLs (80 mg total) into the skin every 12 (twelve) hours. 48 mL 3   famotidine (PEPCID) 20 MG tablet Take 20 mg by mouth 2 (two) times daily.     fenofibrate (TRICOR) 145 MG tablet Take 145 mg by mouth daily.     gabapentin (NEURONTIN) 300 MG capsule Take 1 capsule (300 mg total) by mouth 3 (three) times daily. 90 capsule 1   HYDROcodone-acetaminophen (NORCO) 7.5-325 MG tablet Take 1 tablet by mouth every 6 (six) hours as needed for moderate pain (pain score 4-6). 120 tablet 0   insulin aspart (NOVOLOG FLEXPEN) 100 UNIT/ML FlexPen Inject 3 Units into the skin 3 (three) times daily with meals. (Patient taking differently: Inject 4-14 Units into the skin See admin instructions. Depending on BGL, inject 4-10 units into the skin after breakfast, 6-12 units after lunch, and 8-14 units after supper) 15 mL 0   lidocaine (XYLOCAINE) 2 % solution Use as directed 15 mLs in the mouth or throat every 4 (four) hours as needed for mouth pain. 100 mL 1   lidocaine-prilocaine (EMLA) cream APPLY TOPICALLY 1 APPLICATION AS NEEDED 30 g 5   linaclotide (LINZESS) 290 MCG CAPS capsule Take 1 capsule (290 mcg total) by mouth daily before breakfast. 30 capsule 3   metFORMIN (GLUCOPHAGE) 1000 MG tablet Take 1,000 mg by mouth 2 (two) times daily with a meal.     metoprolol tartrate (LOPRESSOR) 25 MG tablet Take 1 tablet (25 mg total) by mouth 2 (two) times daily. 180 tablet 3   nitroGLYCERIN (NITROSTAT) 0.4 MG SL tablet Place 1 tablet (0.4 mg total) under the tongue every 5 (five) minutes as  needed for chest pain. 25 tablet 3   ondansetron (ZOFRAN) 8 MG tablet Take 1 tablet (8 mg total) by mouth every 8 (eight) hours as needed for nausea or vomiting. START ON THE THIRD DAY AFTER CHEMOTHERAPY. 30 tablet 1   oxybutynin (DITROPAN-XL) 5 MG 24 hr tablet Take 1 tablet (5 mg total) by mouth at bedtime. 90 tablet 1   pantoprazole (  PROTONIX) 40 MG tablet Take 1 tablet (40 mg total) by mouth daily. 30 tablet 1   polyethylene glycol (MIRALAX / GLYCOLAX) 17 g packet Take 17 g by mouth daily as needed. 14 each 0   prochlorperazine (COMPAZINE) 10 MG tablet Take 1 tablet (10 mg total) by mouth every 6 (six) hours as needed for nausea or vomiting (Zofran resumes 12/16). 30 tablet 0   QUEtiapine (SEROQUEL) 400 MG tablet TAKE 2 TABLETS (800 MG TOTAL) BY MOUTH AT BEDTIME. 180 tablet 2   rosuvastatin (CRESTOR) 40 MG tablet Take 1 tablet (40 mg total) by mouth daily. (Patient not taking: Reported on 12/05/2023) 90 tablet 3   senna (SENOKOT) 8.6 MG TABS tablet Take 1 tablet (8.6 mg total) by mouth 2 (two) times daily. (Patient not taking: Reported on 12/05/2023) 120 tablet 0   TRESIBA FLEXTOUCH 200 UNIT/ML FlexTouch Pen Inject 26 Units into the skin at bedtime.     No current facility-administered medications for this visit.   Facility-Administered Medications Ordered in Other Visits  Medication Dose Route Frequency Provider Last Rate Last Admin   sodium chloride flush (NS) 0.9 % injection 10 mL  10 mL Intravenous PRN [name], MD   10 mL at 08/25/23 1455   Allergies  Allergen Reactions   Lithium Other (See Comments)    Spinal fluid built up in brain   Dulaglutide Nausea And Vomiting and Other (See Comments)    TRULICITY   Nitrofuran Derivatives Itching    Nitrofurantoin Mono- MCR   Penicillins Other (See Comments)    UNKNOWN CHILDHOOD REACTION   Physical Exam: BP 104/60   Pulse 100   Ht 5\' 2"  (1.575 m)   Wt 177 lb (80.3 kg)   LMP 10/04/2016 Comment: after "procedure to stop bleeding"   BMI 32.37 kg/m  Constitutional: Pleasant,well-developed, female in no acute distress. HEENT: Normocephalic and atraumatic. Conjunctivae are normal. No scleral icterus. Cardiovascular: Normal rate Pulmonary/chest: Effort normal and breath sounds normal. No wheezing, rales or rhonchi. Abdominal: Soft, nondistended, nontender. Bowel sounds active throughout. There are no masses palpable. No hepatomegaly. Extremities: No edema Neurological: Alert and oriented to person place and time. Skin: Skin is warm and dry. No rashes noted. Psychiatric: Normal mood and affect. Behavior is normal.  Labs 01/2023: CBC nml. CMP with elevated glucose of 253 and elevated Cr of 1.08.   Labs 10/2023: CBC with low WBC of 2.8, low Hb of 10.6, and low plts of 149. CMP with mildly low sodium of 133 and elevated glucose of 196.  CT A/P 08/14/20: IMPRESSION: Vascular findings: 1. Negative for aortic aneurysm or dissection. 2. Small wedge-shaped areas of decreased enhancement within the inferior poles of the right kidney and left kidney, concerning for areas of focal renal infarction. Pyelonephritis could have a similar appearance. Correlation with urinalysis is recommended. 3. Focal 11 x 5 mm area of noncalcified atherosclerotic plaque with a slightly polypoid configuration projecting into the lumen of the distal descending thoracic aorta. This may be at risk for potential plaque thrombosis. Nonvascular findings: 1. Findings of acute uncomplicated pancreatitis. No evidence of pancreatic necrosis or peripancreatic fluid collection. Correlate with serum lipase. 2. Findings suggestive of hepatic steatosis. 3. Indeterminate 1.8 cm rounded area of hypoattenuation within the anterior aspect of the spleen. Further evaluation with contrast-enhanced MRI can be performed on a nonemergent basis. 4. Slightly mottled appearance of the left third rib anteriorly with some adjacent scarring in the adjacent lung, potentially representing post  radiation changes. 5. Left  breast skin thickening with suspected lumpectomy site at the inner left breast. Continued mammographic follow-up is recommended.    PET CT 11/29/22: IMPRESSION: 1. No evidence breast cancer recurrence on FDG PET scan. 2. Postradiation change in the LEFT upper lobe is stable. 3. Stable small nodule in the RIGHT upper lobe. 4. No skeletal metastasis  CT C/A/P w/contrast 10/06/23: IMPRESSION: 1. Possible postsurgical changes in the left supraclavicular area, similar to PET-CT of 2 months ago but new from 03/04/2023. There was no significant hypermetabolic activity in these areas at recent PET-CT, although metastatic disease is a concern, especially if there has been no prior surgery in this area. The patient did present for ultrasound with a palpable abnormality in this region 3 months ago. Consider dedicated CT of the neck or follow up PET-CT. 2. Postsurgical changes in the left breast, grossly stable. 3. No evidence of distant metastatic disease in the chest, abdomen or pelvis. 4. Prominent stool throughout the colon suggesting constipation. 5.  Aortic Atherosclerosis (ICD10-I70.0).  EGD 03/22/23: - Normal esophagus.  - Hiatal hernia.  - Small amount of bilious gastric fluid.  - Erythematous mucosa in the antrum. Biopsied.  - Duodenal mucosal lymphangiectasia. Biopsied. Path: 1. Surgical [P], small bowel DUODENAL MUCOSA WITH NORMAL VILLOUS ARCHITECTURE. NO VILLOUS ATROPHY OR INCREASED INTRAEPITHELIAL LYMPHOCYTES. 2. Surgical [P], gastric ANTRAL AND OXYNTIC MUCOSA WITH MILD CHANGES OF REACTIVE GASTROPATHY. NEGATIVE FOR HELICOBACTER PYLORI.  ASSESSMENT AND PLAN: N&V GERD Hiatal hernia Constipation Hemorrhoids Patient overall is doing okay.  Her GERD is under fairly good control at this time.  Her constipation is improved on Linzess, though not completely normal.  Patient's diet was also recently altered due to radiation therapy so she is going to try  to drink more water eating more fiber, which is likely also going to help with her bowel habits. I also recommended that the patient consider adding on MiraLAX to see if this further helps with inducing more frequent bowel movements.   If this is not effective, could consider Movantik since patient is on chronic opioid therapy, which is likely contributing to her constipation.  Patient's nausea and vomiting seems to the benefit from Zofran as needed so we will continue this regimen.  Patient's nausea and vomiting has been previously thought to be multifactorial due to GERD, constipation, blood sugar fluctuations, marijuana use, and/or gastroparesis. Patient also described some rectal pain due to hemorrhoids so will give her some treatment for this issue.. - Previously gave GERD handout - She is going to aim to drink more water and eat more fiber. She will also restart a daily fiber supplement - Continue Linzess 290 mcg every day. Refill - Consider Miralax every day - Continue PPI BID. Refill - Okay to stop Pepcid since it was not helping - Continue Zofran PRN ODT version. Refill - Anusol HC cream BID for 7 days for hemorrhoids - Avoid marijuana use - Next colonoscopy for colon cancer screening due in 2027 - RTC in 3 months  Eulah Pont, MD  I spent 41 minutes of time, including in depth chart review, independent review of results as outlined above, communicating results with the patient directly, face-to-face time with the patient, coordinating care, ordering studies and medications as appropriate, and documentation.

## 2023-12-05 NOTE — Patient Instructions (Addendum)
 We have sent the following medications to your pharmacy for you to pick up at your convenience: Pantoprazole ,Zofran , Linzess   Follow up in 3 months   If your blood pressure at your visit was 140/90 or greater, please contact your primary care physician to follow up on this.  _______________________________________________________  If you are age 56 or older, your body mass index should be between 23-30. Your Body mass index is 32.37 kg/m. If this is out of the aforementioned range listed, please consider follow up with your Primary Care Provider.  If you are age 51 or younger, your body mass index should be between 19-25. Your Body mass index is 32.37 kg/m. If this is out of the aformentioned range listed, please consider follow up with your Primary Care Provider.   ________________________________________________________  The [facility] providers would like to encourage you to use MYCHART to communicate with providers for non-urgent requests or questions.  Due to long hold times on the telephone, sending your provider a message by [facility] may be a faster and more efficient way to get a response.  Please allow 48 business hours for a response.  Please remember that this is for non-urgent requests.  _______________________________________________________  Thank you for entrusting me with your care and for choosing [facility], Dr. Regino Caprio

## 2023-12-07 ENCOUNTER — Encounter: Payer: Self-pay | Admitting: Radiation Oncology

## 2023-12-07 ENCOUNTER — Telehealth: Payer: Self-pay

## 2023-12-07 NOTE — Telephone Encounter (Signed)
Clinical Social Work attempted to contact patient by phone to discuss advanced care planning.  Left voicemail with contact information and request for return call.

## 2023-12-12 ENCOUNTER — Encounter: Payer: Self-pay | Admitting: Internal Medicine

## 2023-12-20 ENCOUNTER — Other Ambulatory Visit (HOSPITAL_COMMUNITY): Payer: Self-pay

## 2023-12-20 ENCOUNTER — Telehealth: Payer: Self-pay | Admitting: Pharmacist

## 2023-12-20 ENCOUNTER — Inpatient Hospital Stay: Payer: Medicaid Other

## 2023-12-20 ENCOUNTER — Encounter: Payer: Self-pay | Admitting: Hematology & Oncology

## 2023-12-20 ENCOUNTER — Other Ambulatory Visit: Payer: Self-pay

## 2023-12-20 ENCOUNTER — Inpatient Hospital Stay (HOSPITAL_BASED_OUTPATIENT_CLINIC_OR_DEPARTMENT_OTHER): Payer: Medicaid Other | Admitting: Hematology & Oncology

## 2023-12-20 ENCOUNTER — Inpatient Hospital Stay: Payer: Medicaid Other | Attending: Hematology & Oncology

## 2023-12-20 VITALS — BP 110/56 | HR 95 | Temp 98.2°F | Resp 18

## 2023-12-20 DIAGNOSIS — T451X5A Adverse effect of antineoplastic and immunosuppressive drugs, initial encounter: Secondary | ICD-10-CM

## 2023-12-20 DIAGNOSIS — G620 Drug-induced polyneuropathy: Secondary | ICD-10-CM

## 2023-12-20 DIAGNOSIS — C50912 Malignant neoplasm of unspecified site of left female breast: Secondary | ICD-10-CM | POA: Insufficient documentation

## 2023-12-20 DIAGNOSIS — C7989 Secondary malignant neoplasm of other specified sites: Secondary | ICD-10-CM | POA: Insufficient documentation

## 2023-12-20 DIAGNOSIS — C50011 Malignant neoplasm of nipple and areola, right female breast: Secondary | ICD-10-CM

## 2023-12-20 DIAGNOSIS — Z95828 Presence of other vascular implants and grafts: Secondary | ICD-10-CM

## 2023-12-20 DIAGNOSIS — Z86718 Personal history of other venous thrombosis and embolism: Secondary | ICD-10-CM

## 2023-12-20 DIAGNOSIS — C50012 Malignant neoplasm of nipple and areola, left female breast: Secondary | ICD-10-CM

## 2023-12-20 DIAGNOSIS — F17200 Nicotine dependence, unspecified, uncomplicated: Secondary | ICD-10-CM | POA: Insufficient documentation

## 2023-12-20 DIAGNOSIS — I82C12 Acute embolism and thrombosis of left internal jugular vein: Secondary | ICD-10-CM | POA: Insufficient documentation

## 2023-12-20 DIAGNOSIS — R978 Other abnormal tumor markers: Secondary | ICD-10-CM | POA: Diagnosis not present

## 2023-12-20 LAB — CBC WITH DIFFERENTIAL (CANCER CENTER ONLY)
Abs Immature Granulocytes: 0.02 10*3/uL (ref 0.00–0.07)
Basophils Absolute: 0 10*3/uL (ref 0.0–0.1)
Basophils Relative: 0 %
Eosinophils Absolute: 0.2 10*3/uL (ref 0.0–0.5)
Eosinophils Relative: 4 %
HCT: 30.8 % — ABNORMAL LOW (ref 36.0–46.0)
Hemoglobin: 10.3 g/dL — ABNORMAL LOW (ref 12.0–15.0)
Immature Granulocytes: 0 %
Lymphocytes Relative: 19 %
Lymphs Abs: 1 10*3/uL (ref 0.7–4.0)
MCH: 32.7 pg (ref 26.0–34.0)
MCHC: 33.4 g/dL (ref 30.0–36.0)
MCV: 97.8 fL (ref 80.0–100.0)
Monocytes Absolute: 0.6 10*3/uL (ref 0.1–1.0)
Monocytes Relative: 11 %
Neutro Abs: 3.5 10*3/uL (ref 1.7–7.7)
Neutrophils Relative %: 66 %
Platelet Count: 210 10*3/uL (ref 150–400)
RBC: 3.15 MIL/uL — ABNORMAL LOW (ref 3.87–5.11)
RDW: 18.1 % — ABNORMAL HIGH (ref 11.5–15.5)
WBC Count: 5.3 10*3/uL (ref 4.0–10.5)
nRBC: 0 % (ref 0.0–0.2)

## 2023-12-20 LAB — CMP (CANCER CENTER ONLY)
ALT: 9 U/L (ref 0–44)
AST: 8 U/L — ABNORMAL LOW (ref 15–41)
Albumin: 3.9 g/dL (ref 3.5–5.0)
Alkaline Phosphatase: 64 U/L (ref 38–126)
Anion gap: 9 (ref 5–15)
BUN: 16 mg/dL (ref 6–20)
CO2: 25 mmol/L (ref 22–32)
Calcium: 9.6 mg/dL (ref 8.9–10.3)
Chloride: 104 mmol/L (ref 98–111)
Creatinine: 0.76 mg/dL (ref 0.44–1.00)
GFR, Estimated: >60 mL/min (ref >60)
Glucose, Bld: 175 mg/dL — ABNORMAL HIGH (ref 70–99)
Potassium: 4.3 mmol/L (ref 3.5–5.1)
Sodium: 138 mmol/L (ref 135–145)
Total Bilirubin: 0.4 mg/dL (ref 0.0–1.2)
Total Protein: 6.4 g/dL — ABNORMAL LOW (ref 6.5–8.1)

## 2023-12-20 LAB — TSH: TSH: 1.306 u[IU]/mL (ref 0.350–4.500)

## 2023-12-20 MED ORDER — TALAZOPARIB TOSYLATE 1 MG PO CAPS
1.0000 mg | ORAL_CAPSULE | Freq: Every day | ORAL | 6 refills | Status: DC
  Filled 2023-12-22: qty 30, 30d supply, fill #0

## 2023-12-20 MED ORDER — SODIUM CHLORIDE 0.9% FLUSH
10.0000 mL | INTRAVENOUS | Status: DC | PRN
Start: 2023-12-20 — End: 2023-12-20
  Administered 2023-12-20: 10 mL via INTRAVENOUS

## 2023-12-20 MED ORDER — HEPARIN SOD (PORK) LOCK FLUSH 100 UNIT/ML IV SOLN
500.0000 [IU] | Freq: Once | INTRAVENOUS | Status: AC
  Administered 2023-12-20: 500 [IU] via INTRAVENOUS

## 2023-12-20 NOTE — Telephone Encounter (Signed)
 Oral Oncology Pharmacist Encounter  Received new prescription for Talzenna (talazoparib) for the treatment of metastatic triple negative breast cancer, with HRD mutated, per MD note 12/20/23, planned duration until disease progression or unacceptable drug toxicity.  CBC w/ Diff and CMP from 12/20/23 assessed, noted patient with hxo elevated serum glucose (most recently 175 mg/dL) - this will need to be monitored at office f/u as Charlett Lango can also cause hyperglycemia. Prescription dose and frequency assessed for appropriateness.  Current medication list in Epic reviewed, no relevant/significant DDIs with Talzenna identified.  Evaluated chart and no patient barriers to medication adherence noted.   Prescription has been e-scribed to the [facility] for benefits analysis and approval.  Oral Oncology Clinic will continue to follow for insurance authorization, copayment issues, initial counseling and start date.  Sherry Ruffing, PharmD, BCPS, BCOP Hematology/Oncology Clinical Pharmacist [facility] Oral Chemotherapy Navigation Clinics [phone] 12/20/2023 12:52 PM

## 2023-12-20 NOTE — Progress Notes (Signed)
 Hematology and Oncology Follow Up Visit  Ariana Herrera [id number] [birth year] 58 y.o. 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  She really looks quite good.  The left side of her neck looks a whole lot better.  It is soft.  I do not feel a firm mass now.  Her last CA 27.29 was down to 42.  I have to believe that she has had a very nice response with the chemo/radiotherapy.  She finishes up little over a month ago.  I think be worthwhile to set her up with a MRI to see how everything looks now.  She continues on the Lovenox for her thrombus.  We will see how that looks also.  Maybe, we can switch her over to something oral.  She is still smoking.  She smokes a pack and a half a day.  Her blood sugars are on the high side.  Both of these will certainly be a problem down the road.  She has had no problems with her left breast.  This appears to be less swollen.  There is no change in bowel or bladder habits.  She has had no cough.  She has had no nausea or vomiting.  Her dad is in from [place].  She has had a nice time with him.  She has had no bleeding.  Currently, I would say that her performance status is probably ECOG 1.   Medications:  Allergies as of 12/20/2023       Reactions   Lithium Other (See Comments)   Spinal fluid built up in brain   Dulaglutide Nausea And Vomiting, Other (See Comments)   TRULICITY   Nitrofuran Derivatives  Itching   Nitrofurantoin Mono- MCR   Penicillins Other (See Comments)   UNKNOWN CHILDHOOD REACTION        Medication List        Accurate as of December 20, 2023 11:00 AM. If you have any questions, ask your nurse or doctor.          Accu-Chek Guide test strip Generic drug: glucose blood 3 (three) times daily.   albuterol 1.25 MG/3ML nebulizer solution Commonly known as: ACCUNEB Take 1 ampule by nebulization every 6 (six) hours as needed for wheezing or shortness of breath.   albuterol 108 (90 Base) MCG/ACT inhaler Commonly known as: VENTOLIN HFA Inhale 2 puffs into the lungs every 6 (six) hours as needed for wheezing or shortness of breath.   benzonatate 100 MG capsule Commonly known as: TESSALON Take 1 capsule (100 mg total) by mouth 2 (two) times daily as needed.   clotrimazole 10 MG troche Commonly known as: MYCELEX Take 1 tablet (10 mg total) by mouth 5 (five) times daily. Slowly dissolve in mouth   cyclobenzaprine 5 MG tablet Commonly known as: FLEXERIL Take 5 mg by mouth 2 (two) times daily as needed for muscle spasms.   enoxaparin 80 MG/0.8ML injection Commonly known as: LOVENOX  Inject 0.8 mLs (80 mg total) into the skin every 12 (twelve) hours.   fenofibrate 145 MG tablet Commonly known as: TRICOR Take 145 mg by mouth daily.   FreeStyle Libre 3 Sensor Misc Apply 1 sensor every 14 (fourteen) days.   gabapentin 300 MG capsule Commonly known as: NEURONTIN Take 1 capsule (300 mg total) by mouth 3 (three) times daily.   HYDROcodone-acetaminophen 7.5-325 MG tablet Commonly known as: Norco Take 1 tablet by mouth every 6 (six) hours as needed for moderate pain (pain score 4-6).   lidocaine 2 % solution Commonly known as: XYLOCAINE Use as directed 15 mLs in the mouth or throat every 4 (four) hours as needed for mouth pain.   lidocaine-prilocaine cream Commonly known as: EMLA APPLY TOPICALLY 1 APPLICATION AS NEEDED   linaclotide 290 MCG Caps  capsule Commonly known as: Linzess Take 1 capsule (290 mcg total) by mouth daily before breakfast.   metFORMIN 1000 MG tablet Commonly known as: GLUCOPHAGE Take 1,000 mg by mouth 2 (two) times daily with a meal.   metoprolol tartrate 25 MG tablet Commonly known as: LOPRESSOR Take 1 tablet (25 mg total) by mouth 2 (two) times daily.   nitroGLYCERIN 0.4 MG SL tablet Commonly known as: NITROSTAT Place 1 tablet (0.4 mg total) under the tongue every 5 (five) minutes as needed for chest pain.   NovoLOG FlexPen 100 UNIT/ML FlexPen Generic drug: insulin aspart Inject 3 Units into the skin 3 (three) times daily with meals. What changed:  how much to take when to take this additional instructions   ondansetron 8 MG tablet Commonly known as: ZOFRAN Take 1 tablet (8 mg total) by mouth every 8 (eight) hours as needed for nausea or vomiting. START ON THE THIRD DAY AFTER CHEMOTHERAPY.   oxybutynin 5 MG 24 hr tablet Commonly known as: DITROPAN-XL Take 1 tablet (5 mg total) by mouth at bedtime.   pantoprazole 40 MG tablet Commonly known as: PROTONIX Take 1 tablet (40 mg total) by mouth daily.   PEG 3350 17 g Pack Take 17 g by mouth daily as needed.   prochlorperazine 10 MG tablet Commonly known as: COMPAZINE Take 1 tablet (10 mg total) by mouth every 6 (six) hours as needed for nausea or vomiting (Zofran resumes 12/16).   QUEtiapine 400 MG tablet Commonly known as: SEROQUEL TAKE 2 TABLETS (800 MG TOTAL) BY MOUTH AT BEDTIME.   rosuvastatin 40 MG tablet Commonly known as: CRESTOR Take 1 tablet (40 mg total) by mouth daily.   senna 8.6 MG Tabs tablet Commonly known as: SENOKOT Take 1 tablet (8.6 mg total) by mouth 2 (two) times daily.   Evaristo Bury FlexTouch 200 UNIT/ML FlexTouch Pen Generic drug: insulin degludec Inject 26 Units into the skin at bedtime.        Allergies:  Allergies  Allergen Reactions   Lithium Other (See Comments)    Spinal fluid built up in brain    Dulaglutide Nausea And Vomiting and Other (See Comments)    TRULICITY   Nitrofuran Derivatives Itching    Nitrofurantoin Mono- MCR   Penicillins Other (See Comments)    UNKNOWN CHILDHOOD REACTION    Past Medical History, Surgical history, Social history, and Family History were reviewed and updated.  Review of Systems: Review of Systems  Constitutional:  Positive for malaise/fatigue.  HENT: Negative.    Eyes: Negative.   Respiratory: Negative.    Cardiovascular:  Positive for leg swelling.  Gastrointestinal:  Positive for nausea.  Genitourinary: Negative.  Musculoskeletal:  Positive for joint pain and myalgias.  Skin: Negative.   Neurological:  Positive for tingling.  Endo/Heme/Allergies: Negative.   Psychiatric/Behavioral: Negative.       Physical Exam:  oral temperature is 98.2 F (36.8 C). Her blood pressure is 110/56 (abnormal) and her pulse is 95. Her respiration is 18 and oxygen saturation is 100%.   Wt Readings from Last 3 Encounters:  12/05/23 177 lb (80.3 kg)  12/01/23 176 lb (79.8 kg)  11/22/23 175 lb (79.4 kg)    Physical Exam Vitals reviewed.  Constitutional:      Comments: On her breast exam, the right breast is without masses, edema or erythema.  There is no right axillary adenopathy.  Her left breast is somewhat contracted from radiation and surgery.  It is somewhat firm.  It is somewhat swollen although still contracted.  It is tender.  I cannot palpate any adenopathy in the left axilla.  There is no left nipple discharge.  HENT:     Head: Normocephalic and atraumatic.     Comments: She does not have any swelling now.  I do not feel any firmness on the left side of her neck.  No adenopathy is noted.  She has no intraoral lesions.  Eyes:     Pupils: Pupils are equal, round, and reactive to light.  Cardiovascular:     Rate and Rhythm: Normal rate and regular rhythm.     Heart sounds: Normal heart sounds.  Pulmonary:     Effort: Pulmonary effort is  normal.     Breath sounds: Normal breath sounds.  Abdominal:     General: Bowel sounds are normal.     Palpations: Abdomen is soft.  Musculoskeletal:        General: No tenderness or deformity. Normal range of motion.     Cervical back: Normal range of motion.  Lymphadenopathy:     Cervical: No cervical adenopathy.  Skin:    General: Skin is warm and dry.     Findings: No erythema or rash.  Neurological:     Mental Status: She is alert and oriented to person, place, and time.  Psychiatric:        Behavior: Behavior normal.        Thought Content: Thought content normal.        Judgment: Judgment normal.     Lab Results  Component Value Date   WBC 2.8 (L) 11/22/2023   HGB 10.6 (L) 11/22/2023   HCT 31.3 (L) 11/22/2023   MCV 96.0 11/22/2023   PLT 149 (L) 11/22/2023   Lab Results  Component Value Date   FERRITIN 33 08/25/2023   IRON 61 08/25/2023   TIBC 400 08/25/2023   UIBC 339 08/25/2023   IRONPCTSAT 15 08/25/2023   Lab Results  Component Value Date   RETICCTPCT 4.0 (H) 08/05/2022   RBC 3.26 (L) 11/22/2023   No results found for: "KPAFRELGTCHN", "LAMBDASER", "KAPLAMBRATIO" No results found for: "IGGSERUM", "IGA", "IGMSERUM" No results found for: "TOTALPROTELP", "ALBUMINELP", "A1GS", "A2GS", "BETS", "BETA2SER", "GAMS", "MSPIKE", "SPEI"   Chemistry      Component Value Date/Time   NA 133 (L) 11/22/2023 0925   K 4.6 11/22/2023 0925   CL 101 11/22/2023 0925   CO2 25 11/22/2023 0925   BUN 20 11/22/2023 0925   CREATININE 0.77 11/22/2023 0925      Component Value Date/Time   CALCIUM 9.2 11/22/2023 0925   ALKPHOS 59 11/22/2023 0925   AST 9 (L) 11/22/2023 1191  ALT 7 11/22/2023 0925   BILITOT 0.3 11/22/2023 1610       Impression and Plan: Ariana Herrera is a very pleasant  58 yo caucasian female with recurrent ductal carcinoma of the left breast.  It is amazing that the recurrence is just been in the neck.  Again, the left neck looks a whole lot better.  Her face  is not swollen.  I have to believe that the thrombus in the internal jugular vein is resolving.  I also believe that the malignancy is also resolving pushing on her vein.  Of note, she does have a tumor that is HRD +.  As such, I will use a PARP Inhibitor.  I would like to use talazoparib.  I think this is reasonable.  Hopefully her insurance will let us use this.  We will see about the MRI.  I will set this up in 2 weeks.  I am just happy that her quality of life is better now.  I just wish that she would stop smoking.  I think this will be our biggest problem.  I will like to see her back in a month.  She will finish of the radiation this week.  I probably would not do any scans on her for at least 2 months.  However, we really need to get her on systemic chemotherapy so that we can try to prevent progression of disease systemically.  Given that she has triple negative disease, I would certainly use immunotherapy along with chemotherapy.  I would like to have her come back in a month.  At that point time, I will discuss with her how we can treat her systemically.  Again she will do Lovenox at 80 mg twice a day.  We will probably check a Doppler of her left neck at some point couple months.  Hopefully, she will continue to try to cut back on smoking.    [name], MD 2/25/202511:00 AM

## 2023-12-20 NOTE — Patient Instructions (Signed)

## 2023-12-21 ENCOUNTER — Other Ambulatory Visit (HOSPITAL_BASED_OUTPATIENT_CLINIC_OR_DEPARTMENT_OTHER): Payer: Self-pay

## 2023-12-21 ENCOUNTER — Other Ambulatory Visit (HOSPITAL_COMMUNITY): Payer: Self-pay

## 2023-12-21 ENCOUNTER — Other Ambulatory Visit: Payer: Self-pay | Admitting: Hematology & Oncology

## 2023-12-21 LAB — CANCER ANTIGEN 27.29: CA 27.29: 34.9 U/mL (ref 0.0–38.6)

## 2023-12-21 MED ORDER — HYDROCODONE-ACETAMINOPHEN 7.5-325 MG PO TABS
1.0000 | ORAL_TABLET | Freq: Four times a day (QID) | ORAL | 0 refills | Status: DC | PRN
  Filled 2023-12-21: qty 120, 30d supply, fill #0

## 2023-12-22 ENCOUNTER — Other Ambulatory Visit (HOSPITAL_COMMUNITY): Payer: Self-pay

## 2023-12-22 ENCOUNTER — Other Ambulatory Visit: Payer: Self-pay | Admitting: Pharmacy Technician

## 2023-12-22 ENCOUNTER — Other Ambulatory Visit: Payer: Self-pay

## 2023-12-22 MED ORDER — TALAZOPARIB TOSYLATE 1 MG PO CAPS: 1.0000 mg | ORAL_CAPSULE | Freq: Every day | ORAL | 6 refills | Status: DC

## 2023-12-22 NOTE — Progress Notes (Signed)
 Specialty Pharmacy Initial Fill Coordination Note  Ariana Herrera is a 58 y.o. female contacted today regarding refills of specialty medication(s) Talazoparib Tosylate (TALZENNA) .  Patient requested Delivery  on 12/28/23  to verified address [address] [facility] [place] [address]   Medication will be filled on 12/27/2023.   Patient is aware of $4 copayment.   [name], CPhT Oncology Pharmacy Patient Advocate [facility] Direct Number: [phone] Fax: [phone]

## 2023-12-22 NOTE — Telephone Encounter (Signed)
 Oral Oncology Pharmacist Encounter   Notified by [facility] that they are unable to dispense Talzenna. Talzenna prescription has been redirected to [facility] for dispensing.   Sherry Ruffing, PharmD, BCPS, BCOP Hematology/Oncology Clinical Pharmacist [facility] and [facility] Oral Chemotherapy Navigation Clinics [phone] 12/22/2023 3:57 PM

## 2023-12-22 NOTE — Telephone Encounter (Signed)
 Oral Chemotherapy Pharmacist Encounter  I spoke with patient for overview of new oral chemotherapy medication: Talzenna (talazoparib) for the treatment of metastatic triple negative breast cancer, HRD mutated, per MD note 12/20/23, planned duration until disease progression or unacceptable drug toxicity.   Pt is doing well. Counseled patient on administration, dosing, side effects, monitoring, drug-food interactions, safe handling, storage, and disposal.  Patient will take Talzenna 1mg  tablets, 1 tablets (1mg ) by mouth once daily without regard to food.  Talzenna start date: 12/29/23 (medication will delivery to patient's home on 12/28/23)  Side effects include but not limited to: fatigue, headache, nausea, vomiting, decreased blood counts, diarrhea, hyperglycemia   Nausea/vomiting: Patient has anti-emetic on hand and knows to take it if nausea develops.   Diarrhea: Patient will obtain anti diarrheal and alert the office of 4 or more loose stools above baseline.  Reviewed with patient importance of keeping a medication schedule and plan for any missed doses. No barriers to medication adherence identified.  Medication reconciliation performed and medication/allergy list updated.   Ms. Easler voiced understanding and appreciation. All questions answered.  Provided patient with Oral Chemotherapy Navigation Clinic phone number. Patient knows to call the office with questions or concerns. Oral Chemotherapy Navigation Clinic will continue to follow.  Lenord Carbo, PharmD, BCPS, [facility] Hematology/Oncology Clinical Pharmacist [facility] and [facility] Oral Chemotherapy Navigation Clinics [phone] 12/22/2023 9:49 AM

## 2023-12-22 NOTE — Progress Notes (Signed)
 Oral Chemotherapy Pharmacist Encounter  Patient was counseled under telephone encounter from 12/20/23.  Sherry Ruffing, PharmD, BCPS, BCOP Hematology/Oncology Clinical Pharmacist [facility] and [facility] Oral Chemotherapy Navigation Clinics [phone] 12/22/2023 10:07 AM

## 2023-12-22 NOTE — Addendum Note (Signed)
 Addended by: Sherry Ruffing F on: 12/22/2023 04:00 PM   Modules accepted: Orders

## 2023-12-23 ENCOUNTER — Other Ambulatory Visit: Payer: Self-pay

## 2023-12-23 ENCOUNTER — Other Ambulatory Visit: Payer: Self-pay | Admitting: Pharmacy Technician

## 2023-12-23 ENCOUNTER — Telehealth: Payer: Self-pay | Admitting: Pharmacy Technician

## 2023-12-23 NOTE — Telephone Encounter (Signed)
 Oral Oncology Patient Advocate Encounter  Medication is limited distribution and must be filled through [facility].  Script and all supporting documents sent to [facility] for processing and fulfillment.   I have spoken with the pt and provided her with [facility] phone number [phone].  [name], CPhT Oncology Pharmacy Patient Advocate [facility] Direct Number: [phone] Fax: [phone]

## 2023-12-23 NOTE — Progress Notes (Signed)
 Oral Oncology Patient Advocate Encounter  Medication is limited distribution and must be filled through [facility].  Dis-enrolled pt from [employer].  [name], CPhT Oncology Pharmacy Patient Advocate [facility] Direct Number: [phone] Fax: [phone]

## 2023-12-26 ENCOUNTER — Other Ambulatory Visit: Payer: Self-pay

## 2023-12-27 ENCOUNTER — Ambulatory Visit (INDEPENDENT_AMBULATORY_CARE_PROVIDER_SITE_OTHER): Payer: Medicaid Other | Admitting: Physician Assistant

## 2023-12-27 VITALS — BP 101/65 | HR 93 | Temp 98.1°F | Ht 62.0 in | Wt 171.4 lb

## 2023-12-27 DIAGNOSIS — S61532A Puncture wound without foreign body of left wrist, initial encounter: Secondary | ICD-10-CM | POA: Diagnosis not present

## 2023-12-27 NOTE — Progress Notes (Signed)
 Established patient visit   Patient: Ariana Herrera   DOB: May 08, 1966   58 y.o. Female  MRN: [id number] Visit Date: 12/27/2023  Today's healthcare provider: Alfredia Ferguson, PA-C   Cc. Dog bite  Subjective    Pt was bit by a family member's dog 1.5 weeks ago on her left wrist. Reports it was a through-and-through puncture. She went to urgent care, is taking Metronidazole and Doxycycline, reports an xray was negative. Reports dog was fully vaccinated.  Today, pt reports improved but still painful and swollen.  Medications: Outpatient Medications Prior to Visit  Medication Sig   doxycycline (VIBRAMYCIN) 100 MG capsule Take 100 mg by mouth 2 (two) times daily.   metroNIDAZOLE (FLAGYL) 500 MG tablet Take 500 mg by mouth 3 (three) times daily.   ACCU-CHEK GUIDE test strip 3 (three) times daily.   albuterol (ACCUNEB) 1.25 MG/3ML nebulizer solution Take 1 ampule by nebulization every 6 (six) hours as needed for wheezing or shortness of breath.   albuterol (VENTOLIN HFA) 108 (90 Base) MCG/ACT inhaler Inhale 2 puffs into the lungs every 6 (six) hours as needed for wheezing or shortness of breath.   benzonatate (TESSALON) 100 MG capsule Take 1 capsule (100 mg total) by mouth 2 (two) times daily as needed.   clotrimazole (MYCELEX) 10 MG troche Take 1 tablet (10 mg total) by mouth 5 (five) times daily. Slowly dissolve in mouth   Continuous Glucose Sensor (FREESTYLE LIBRE 3 SENSOR) MISC Apply 1 sensor every 14 (fourteen) days.   cyclobenzaprine (FLEXERIL) 5 MG tablet Take 5 mg by mouth 2 (two) times daily as needed for muscle spasms.   enoxaparin (LOVENOX) 80 MG/0.8ML injection Inject 0.8 mLs (80 mg total) into the skin every 12 (twelve) hours.   fenofibrate (TRICOR) 145 MG tablet Take 145 mg by mouth daily.   gabapentin (NEURONTIN) 300 MG capsule Take 1 capsule (300 mg total) by mouth 3 (three) times daily.   HYDROcodone-acetaminophen (NORCO) 7.5-325 MG tablet Take 1 tablet by mouth every  6 (six) hours as needed for moderate pain (pain score 4-6).   insulin aspart (NOVOLOG FLEXPEN) 100 UNIT/ML FlexPen Inject 3 Units into the skin 3 (three) times daily with meals. (Patient taking differently: Inject 4-14 Units into the skin See admin instructions. Depending on BGL, inject 4-10 units into the skin after breakfast, 6-12 units after lunch, and 8-14 units after supper)   lidocaine (XYLOCAINE) 2 % solution Use as directed 15 mLs in the mouth or throat every 4 (four) hours as needed for mouth pain.   lidocaine-prilocaine (EMLA) cream APPLY TOPICALLY 1 APPLICATION AS NEEDED   linaclotide (LINZESS) 290 MCG CAPS capsule Take 1 capsule (290 mcg total) by mouth daily before breakfast.   metFORMIN (GLUCOPHAGE) 1000 MG tablet Take 1,000 mg by mouth 2 (two) times daily with a meal.   metoprolol tartrate (LOPRESSOR) 25 MG tablet Take 1 tablet (25 mg total) by mouth 2 (two) times daily.   nitroGLYCERIN (NITROSTAT) 0.4 MG SL tablet Place 1 tablet (0.4 mg total) under the tongue every 5 (five) minutes as needed for chest pain.   ondansetron (ZOFRAN) 8 MG tablet Take 1 tablet (8 mg total) by mouth every 8 (eight) hours as needed for nausea or vomiting. START ON THE THIRD DAY AFTER CHEMOTHERAPY.   oxybutynin (DITROPAN-XL) 5 MG 24 hr tablet Take 1 tablet (5 mg total) by mouth at bedtime.   pantoprazole (PROTONIX) 40 MG tablet Take 1 tablet (40 mg total) by mouth  daily.   polyethylene glycol (MIRALAX / GLYCOLAX) 17 g packet Take 17 g by mouth daily as needed.   prochlorperazine (COMPAZINE) 10 MG tablet Take 1 tablet (10 mg total) by mouth every 6 (six) hours as needed for nausea or vomiting (Zofran resumes 12/16).   QUEtiapine (SEROQUEL) 400 MG tablet TAKE 2 TABLETS (800 MG TOTAL) BY MOUTH AT BEDTIME.   rosuvastatin (CRESTOR) 40 MG tablet Take 1 tablet (40 mg total) by mouth daily.   senna (SENOKOT) 8.6 MG TABS tablet Take 1 tablet (8.6 mg total) by mouth 2 (two) times daily.   talazoparib tosylate  (TALZENNA) 1 MG capsule Take 1 capsule (1 mg total) by mouth daily.   TRESIBA FLEXTOUCH 200 UNIT/ML FlexTouch Pen Inject 26 Units into the skin at bedtime.   Facility-Administered Medications Prior to Visit  Medication Dose Route Frequency Provider   sodium chloride flush (NS) 0.9 % injection 10 mL  10 mL Intravenous PRN [name], MD    Review of Systems  Constitutional:  Negative for fatigue and fever.  Respiratory:  Negative for cough and shortness of breath.   Cardiovascular:  Negative for chest pain and leg swelling.  Gastrointestinal:  Negative for abdominal pain.  Skin:  Positive for wound.  Neurological:  Negative for dizziness and headaches.       Objective    BP 101/65   Pulse 93   Temp 98.1 F (36.7 C)   Ht 5\' 2"  (1.575 m)   Wt 171 lb 6.4 oz (77.7 kg)   LMP 10/04/2016 Comment: after "procedure to stop bleeding"  BMI 31.35 kg/m    Physical Exam Vitals reviewed.  Constitutional:      Appearance: She is not ill-appearing.  HENT:     Head: Normocephalic.  Eyes:     Conjunctiva/sclera: Conjunctivae normal.  Cardiovascular:     Rate and Rhythm: Normal rate.  Pulmonary:     Effort: Pulmonary effort is normal. No respiratory distress.  Skin:    Comments: L wrist with edema, no fluctuance.  Three puncture wounds, minimal surrounding erythema. No drainage.  Pt able to fully move L wrist  Neurological:     Mental Status: She is alert and oriented to person, place, and time.  Psychiatric:        Mood and Affect: Mood normal.        Behavior: Behavior normal.     No results found for any visits on 12/27/23.  Assessment & Plan    Puncture wound of left wrist, initial encounter  Cont abx. If any of her symptoms-- swelling, redness, pain increase, would refer to hand specialist for MRI r/o infection in muscle tissue. Today does appear to be healing  Pt declines tetanus vaccine today, reports she had one w/in 5 years. Will try to obtain records.  Return  if symptoms worsen or fail to improve.       Alfredia Ferguson, PA-C  [facility] [phone] (phone) [phone] (fax)  [facility]

## 2023-12-28 ENCOUNTER — Ambulatory Visit (HOSPITAL_COMMUNITY)
Admission: RE | Admit: 2023-12-28 | Discharge: 2023-12-28 | Disposition: A | Discharge status: Home / Self Care | Payer: Medicaid Other | Source: Ambulatory Visit | Attending: Hematology & Oncology | Admitting: Hematology & Oncology

## 2023-12-28 ENCOUNTER — Other Ambulatory Visit: Payer: Self-pay

## 2023-12-28 ENCOUNTER — Other Ambulatory Visit: Payer: Self-pay | Admitting: Internal Medicine

## 2023-12-28 ENCOUNTER — Encounter: Payer: Self-pay | Admitting: Radiation Oncology

## 2023-12-28 DIAGNOSIS — C50012 Malignant neoplasm of nipple and areola, left female breast: Secondary | ICD-10-CM | POA: Diagnosis present

## 2023-12-28 DIAGNOSIS — K219 Gastro-esophageal reflux disease without esophagitis: Secondary | ICD-10-CM

## 2023-12-28 DIAGNOSIS — G620 Drug-induced polyneuropathy: Secondary | ICD-10-CM | POA: Diagnosis present

## 2023-12-28 DIAGNOSIS — T451X5A Adverse effect of antineoplastic and immunosuppressive drugs, initial encounter: Secondary | ICD-10-CM | POA: Insufficient documentation

## 2023-12-28 MED ORDER — GADOBUTROL 1 MMOL/ML IV SOLN
6.0000 mL | Freq: Once | INTRAVENOUS | Status: AC | PRN
  Administered 2023-12-28: 6 mL via INTRAVENOUS

## 2023-12-28 NOTE — Progress Notes (Signed)
  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      Indication for treatment:  Curative       Radiation treatment dates:   First Treatment Date: 2023-10-07 Last Treatment Date: 2023-11-24  Site/Dose/Technique/Mode:  Plan Name: HN_L_Neck Site: Neck Left Technique: 3D Mode: Photon Dose Per Fraction: 2 Gy Prescribed Dose (Delivered / Prescribed): 50 Gy / 50 Gy Prescribed Fxs (Delivered / Prescribed): 25 / 25  Narrative: The patient tolerated radiation treatment relatively well. Patient endorse developing fatigue and moderate neck pain. She also developed mild skin irritation in the treatment field. Otherwise, she tolerated treatment quite well with no other significant complications.  Plan: The patient has completed radiation treatment. The patient will return to radiation oncology clinic for routine followup in one month. She was advised to call or return sooner if they have any questions or concerns related to their recovery or treatment.  -----------------------------------   Bryan Lemma, PA-C  This document serves as a record of services personally performed by Bryan Lemma, PA-C It was created on his behalf by Herbie Saxon, a trained medical scribe. The creation of this record is based on the scribe's personal observations and the provider's statements to them. This document has been checked and approved by the attending provider.\

## 2023-12-28 NOTE — Progress Notes (Signed)
 Radiation Oncology         [phone] ________________________________  Name: Ariana Herrera MRN: [id number]  Date: 12/29/2023  DOB: 06/29/1966  Follow-Up Visit Note  CC: Alfredia Ferguson, Leotis Pain, MD  No diagnosis found.  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      Indication for treatment:  Curative       Interval Since Last Radiation:  1 month 7 days  Radiation treatment dates:   First Treatment Date: 2023-10-07 Last Treatment Date: 2023-11-24  Site/Dose/Technique/Mode:  Plan Name: HN_L_Neck Site: Neck Left Technique: 3D Mode: Photon Dose Per Fraction: 2 Gy Prescribed Dose (Delivered / Prescribed): 50 Gy / 50 Gy Prescribed Fxs (Delivered / Prescribed): 25 / 25  Narrative:  The patient returns today for routine follow-up. She was last seen in office on 10-06-23 for her initial consultation.   Since then she completed her radiation treatment which she tolerated quite well. Patient did endorse developing fatigue and moderate neck pain. She also developed mild skin irritation in the treatment field. Otherwise, she tolerated treatment quite well with no other significant complications.                              Patient continued to follow up with specialists to manage her chronic conditions. She presented for a follow up with Dr, Myna Hidalgo on 10-20-23 where she requested to be put on Norco for her pain management. At that time, she complained of occasional mild nausea and vomiting along with mild odynophagia. During her follow up visit on 10-31-23, she reported smoking a pack and a half of cigarettes daily. Dr. Myna Hidalgo explained the possible negative effects of smoking on her treatment and disease progress.   Most recent follow up with Dr. Myna Hidalgo on 12-20-23, she reported feeling quite well overall tolerating radiation and chemotherapy with no significant side effects. Neck mass was soft to touch on exam. Per Dr. Gustavo Lah  recommendation, she underwent a soft tissue neck MRI on 12-28-23 which showed (result not finalized )  No other significant oncologic interval history since the patient was last seen.    Allergies:  is allergic to lithium, dulaglutide, nitrofuran derivatives, and penicillins.  Meds: Current Outpatient Medications  Medication Sig Dispense Refill   ACCU-CHEK GUIDE test strip 3 (three) times daily.     albuterol (ACCUNEB) 1.25 MG/3ML nebulizer solution Take 1 ampule by nebulization every 6 (six) hours as needed for wheezing or shortness of breath.     albuterol (VENTOLIN HFA) 108 (90 Base) MCG/ACT inhaler Inhale 2 puffs into the lungs every 6 (six) hours as needed for wheezing or shortness of breath. 18 g 2   benzonatate (TESSALON) 100 MG capsule Take 1 capsule (100 mg total) by mouth 2 (two) times daily as needed. 20 capsule 0   clotrimazole (MYCELEX) 10 MG troche Take 1 tablet (10 mg total) by mouth 5 (five) times daily. Slowly dissolve in mouth 150 tablet 0   Continuous Glucose Sensor (FREESTYLE LIBRE 3 SENSOR) MISC Apply 1 sensor every 14 (fourteen) days. 2 each 12   cyclobenzaprine (FLEXERIL) 5 MG tablet Take 5 mg by mouth 2 (two) times daily as needed for muscle spasms.     doxycycline (VIBRAMYCIN) 100 MG capsule Take 100 mg by mouth 2 (two) times daily.     enoxaparin (LOVENOX) 80 MG/0.8ML injection Inject 0.8 mLs (80 mg total) into the skin every 12 (twelve) hours. 48 mL  3   fenofibrate (TRICOR) 145 MG tablet Take 145 mg by mouth daily.     gabapentin (NEURONTIN) 300 MG capsule Take 1 capsule (300 mg total) by mouth 3 (three) times daily. 90 capsule 1   HYDROcodone-acetaminophen (NORCO) 7.5-325 MG tablet Take 1 tablet by mouth every 6 (six) hours as needed for moderate pain (pain score 4-6). 120 tablet 0   insulin aspart (NOVOLOG FLEXPEN) 100 UNIT/ML FlexPen Inject 3 Units into the skin 3 (three) times daily with meals. (Patient taking differently: Inject 4-14 Units into the skin See admin  instructions. Depending on BGL, inject 4-10 units into the skin after breakfast, 6-12 units after lunch, and 8-14 units after supper) 15 mL 0   lidocaine (XYLOCAINE) 2 % solution Use as directed 15 mLs in the mouth or throat every 4 (four) hours as needed for mouth pain. 100 mL 1   lidocaine-prilocaine (EMLA) cream APPLY TOPICALLY 1 APPLICATION AS NEEDED 30 g 5   linaclotide (LINZESS) 290 MCG CAPS capsule Take 1 capsule (290 mcg total) by mouth daily before breakfast. 30 capsule 3   metFORMIN (GLUCOPHAGE) 1000 MG tablet Take 1,000 mg by mouth 2 (two) times daily with a meal.     metoprolol tartrate (LOPRESSOR) 25 MG tablet Take 1 tablet (25 mg total) by mouth 2 (two) times daily. 180 tablet 3   metroNIDAZOLE (FLAGYL) 500 MG tablet Take 500 mg by mouth 3 (three) times daily.     nitroGLYCERIN (NITROSTAT) 0.4 MG SL tablet Place 1 tablet (0.4 mg total) under the tongue every 5 (five) minutes as needed for chest pain. 25 tablet 3   ondansetron (ZOFRAN) 8 MG tablet Take 1 tablet (8 mg total) by mouth every 8 (eight) hours as needed for nausea or vomiting. START ON THE THIRD DAY AFTER CHEMOTHERAPY. 30 tablet 1   oxybutynin (DITROPAN-XL) 5 MG 24 hr tablet Take 1 tablet (5 mg total) by mouth at bedtime. 90 tablet 1   pantoprazole (PROTONIX) 40 MG tablet Take 1 tablet (40 mg total) by mouth daily. 30 tablet 1   polyethylene glycol (MIRALAX / GLYCOLAX) 17 g packet Take 17 g by mouth daily as needed. 14 each 0   prochlorperazine (COMPAZINE) 10 MG tablet Take 1 tablet (10 mg total) by mouth every 6 (six) hours as needed for nausea or vomiting (Zofran resumes 12/16). 30 tablet 0   QUEtiapine (SEROQUEL) 400 MG tablet TAKE 2 TABLETS (800 MG TOTAL) BY MOUTH AT BEDTIME. 180 tablet 2   rosuvastatin (CRESTOR) 40 MG tablet Take 1 tablet (40 mg total) by mouth daily. 90 tablet 3   senna (SENOKOT) 8.6 MG TABS tablet Take 1 tablet (8.6 mg total) by mouth 2 (two) times daily. 120 tablet 0   talazoparib tosylate (TALZENNA) 1  MG capsule Take 1 capsule (1 mg total) by mouth daily. 30 capsule 6   TRESIBA FLEXTOUCH 200 UNIT/ML FlexTouch Pen Inject 26 Units into the skin at bedtime.     No current facility-administered medications for this encounter.   Facility-Administered Medications Ordered in Other Encounters  Medication Dose Route Frequency Provider Last Rate Last Admin   sodium chloride flush (NS) 0.9 % injection 10 mL  10 mL Intravenous PRN [name], MD   10 mL at 08/25/23 1455    Physical Findings: The patient is in no acute distress. Patient is alert and oriented.  vitals were not taken for this visit. .  No significant changes. Lungs are clear to auscultation bilaterally. Heart has regular  rate and rhythm. No palpable cervical, supraclavicular, or axillary adenopathy. Abdomen soft, non-tender, normal bowel sounds.   Lab Findings: Lab Results  Component Value Date   WBC 5.3 12/20/2023   HGB 10.3 (L) 12/20/2023   HCT 30.8 (L) 12/20/2023   MCV 97.8 12/20/2023   PLT 210 12/20/2023    Radiographic Findings: No results found.  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   The patient is recovering from the effects of radiation.  ***  Plan:  ***   *** minutes of total time was spent for this patient encounter, including preparation, face-to-face counseling with the patient and coordination of care, physical exam, and documentation of the encounter. ____________________________________  Billie Lade, PhD, MD  This document serves as a record of services personally performed by Antony Blackbird, MD. It was created on his behalf by Neena Rhymes, a trained medical scribe. The creation of this record is based on the scribe's personal observations and the provider's statements to them. This document has been checked and approved by the attending provider.

## 2023-12-29 ENCOUNTER — Ambulatory Visit
Admission: RE | Admit: 2023-12-29 | Discharge: 2023-12-29 | Disposition: A | Discharge status: Home / Self Care | Payer: Self-pay | Source: Ambulatory Visit | Attending: Radiation Oncology | Admitting: Radiation Oncology

## 2023-12-29 ENCOUNTER — Encounter: Payer: Self-pay | Admitting: Radiation Oncology

## 2023-12-29 VITALS — BP 136/74 | HR 109 | Temp 97.8°F | Resp 18 | Ht 62.0 in | Wt 168.6 lb

## 2023-12-29 DIAGNOSIS — C770 Secondary and unspecified malignant neoplasm of lymph nodes of head, face and neck: Secondary | ICD-10-CM | POA: Insufficient documentation

## 2023-12-29 DIAGNOSIS — C50011 Malignant neoplasm of nipple and areola, right female breast: Secondary | ICD-10-CM | POA: Diagnosis not present

## 2023-12-29 DIAGNOSIS — Z170 Estrogen receptor positive status [ER+]: Secondary | ICD-10-CM | POA: Insufficient documentation

## 2023-12-29 DIAGNOSIS — Z923 Personal history of irradiation: Secondary | ICD-10-CM | POA: Diagnosis not present

## 2023-12-29 HISTORY — DX: Personal history of irradiation: Z92.3

## 2023-12-29 NOTE — Progress Notes (Signed)
 Ms. Rohman is here for 1 month follow up after receiving radiation treatment to her left neck. Patient completed treatment on 11/24/23.  Pain issues, if any: No Using a feeding tube?: NA Weight changes, if any: Yes Wt Readings from Last 3 Encounters:  12/29/23 168 lb 9.6 oz (76.5 kg)  12/27/23 171 lb 6.4 oz (77.7 kg)  12/05/23 177 lb (80.3 kg)    Swallowing issues, if any: No Smoking or chewing tobacco? Smoking Last ENT visit was on: NA  Skin: No issues Nausea- Yes, reports nausea daily Fatigue-YEs   Other notable issues, if any: Patient to start taking talazoparib next week    BP 136/74 (BP Location: Right Arm, Patient Position: Sitting, Cuff Size: Large)   Pulse (!) 109   Temp 97.8 F (36.6 C)   Resp 18   Ht 5\' 2"  (1.575 m)   Wt 168 lb 9.6 oz (76.5 kg)   LMP 10/04/2016 Comment: after "procedure to stop bleeding"  SpO2 98%   BMI 30.84 kg/m

## 2023-12-30 ENCOUNTER — Other Ambulatory Visit: Payer: Self-pay | Admitting: Internal Medicine

## 2024-01-09 ENCOUNTER — Encounter: Payer: Self-pay | Admitting: *Deleted

## 2024-01-11 ENCOUNTER — Ambulatory Visit: Payer: Medicaid Other | Admitting: Physician Assistant

## 2024-01-16 ENCOUNTER — Other Ambulatory Visit (HOSPITAL_BASED_OUTPATIENT_CLINIC_OR_DEPARTMENT_OTHER): Payer: Self-pay

## 2024-01-16 ENCOUNTER — Other Ambulatory Visit: Payer: Self-pay | Admitting: Hematology & Oncology

## 2024-01-16 MED ORDER — HYDROCODONE-ACETAMINOPHEN 7.5-325 MG PO TABS
1.0000 | ORAL_TABLET | Freq: Four times a day (QID) | ORAL | 0 refills | Status: DC | PRN
  Filled 2024-01-16 – 2024-01-18 (×3): qty 120, 30d supply, fill #0

## 2024-01-18 ENCOUNTER — Inpatient Hospital Stay: Payer: Medicaid Other

## 2024-01-18 ENCOUNTER — Inpatient Hospital Stay (HOSPITAL_BASED_OUTPATIENT_CLINIC_OR_DEPARTMENT_OTHER): Payer: Medicaid Other | Admitting: Hematology & Oncology

## 2024-01-18 ENCOUNTER — Inpatient Hospital Stay: Payer: Medicaid Other | Attending: Hematology & Oncology

## 2024-01-18 ENCOUNTER — Other Ambulatory Visit: Payer: Self-pay | Admitting: Physician Assistant

## 2024-01-18 ENCOUNTER — Other Ambulatory Visit (HOSPITAL_BASED_OUTPATIENT_CLINIC_OR_DEPARTMENT_OTHER): Payer: Self-pay

## 2024-01-18 ENCOUNTER — Encounter: Payer: Self-pay | Admitting: Hematology & Oncology

## 2024-01-18 ENCOUNTER — Other Ambulatory Visit: Payer: Self-pay

## 2024-01-18 VITALS — BP 91/58 | HR 92 | Temp 98.1°F | Resp 20 | Ht 62.0 in | Wt 167.8 lb

## 2024-01-18 DIAGNOSIS — C50412 Malignant neoplasm of upper-outer quadrant of left female breast: Secondary | ICD-10-CM | POA: Diagnosis present

## 2024-01-18 DIAGNOSIS — Z171 Estrogen receptor negative status [ER-]: Secondary | ICD-10-CM | POA: Diagnosis not present

## 2024-01-18 DIAGNOSIS — C7989 Secondary malignant neoplasm of other specified sites: Secondary | ICD-10-CM | POA: Insufficient documentation

## 2024-01-18 DIAGNOSIS — G620 Drug-induced polyneuropathy: Secondary | ICD-10-CM

## 2024-01-18 DIAGNOSIS — F319 Bipolar disorder, unspecified: Secondary | ICD-10-CM

## 2024-01-18 DIAGNOSIS — C50012 Malignant neoplasm of nipple and areola, left female breast: Secondary | ICD-10-CM | POA: Diagnosis not present

## 2024-01-18 DIAGNOSIS — Z95828 Presence of other vascular implants and grafts: Secondary | ICD-10-CM

## 2024-01-18 DIAGNOSIS — Z86718 Personal history of other venous thrombosis and embolism: Secondary | ICD-10-CM

## 2024-01-18 LAB — CBC WITH DIFFERENTIAL (CANCER CENTER ONLY)
Abs Immature Granulocytes: 0.07 10*3/uL (ref 0.00–0.07)
Basophils Absolute: 0 10*3/uL (ref 0.0–0.1)
Basophils Relative: 0 %
Eosinophils Absolute: 0.1 10*3/uL (ref 0.0–0.5)
Eosinophils Relative: 1 %
HCT: 30.2 % — ABNORMAL LOW (ref 36.0–46.0)
Hemoglobin: 10.3 g/dL — ABNORMAL LOW (ref 12.0–15.0)
Immature Granulocytes: 1 %
Lymphocytes Relative: 20 %
Lymphs Abs: 1 10*3/uL (ref 0.7–4.0)
MCH: 33.4 pg (ref 26.0–34.0)
MCHC: 34.1 g/dL (ref 30.0–36.0)
MCV: 98.1 fL (ref 80.0–100.0)
Monocytes Absolute: 0.2 10*3/uL (ref 0.1–1.0)
Monocytes Relative: 4 %
Neutro Abs: 3.6 10*3/uL (ref 1.7–7.7)
Neutrophils Relative %: 74 %
Platelet Count: 47 10*3/uL — ABNORMAL LOW (ref 150–400)
RBC: 3.08 MIL/uL — ABNORMAL LOW (ref 3.87–5.11)
RDW: 15.6 % — ABNORMAL HIGH (ref 11.5–15.5)
Smear Review: NORMAL
WBC Count: 5 10*3/uL (ref 4.0–10.5)
nRBC: 0.4 % — ABNORMAL HIGH (ref 0.0–0.2)

## 2024-01-18 LAB — CMP (CANCER CENTER ONLY)
ALT: 13 U/L (ref 0–44)
AST: 10 U/L — ABNORMAL LOW (ref 15–41)
Albumin: 4 g/dL (ref 3.5–5.0)
Alkaline Phosphatase: 83 U/L (ref 38–126)
Anion gap: 9 (ref 5–15)
BUN: 18 mg/dL (ref 6–20)
CO2: 24 mmol/L (ref 22–32)
Calcium: 9 mg/dL (ref 8.9–10.3)
Chloride: 102 mmol/L (ref 98–111)
Creatinine: 0.97 mg/dL (ref 0.44–1.00)
GFR, Estimated: >60 mL/min (ref >60)
Glucose, Bld: 160 mg/dL — ABNORMAL HIGH (ref 70–99)
Potassium: 4.2 mmol/L (ref 3.5–5.1)
Sodium: 135 mmol/L (ref 135–145)
Total Bilirubin: 0.4 mg/dL (ref 0.0–1.2)
Total Protein: 6.2 g/dL — ABNORMAL LOW (ref 6.5–8.1)

## 2024-01-18 MED ORDER — SODIUM CHLORIDE 0.9% FLUSH
10.0000 mL | Freq: Once | INTRAVENOUS | Status: AC
  Administered 2024-01-18: 10 mL via INTRAVENOUS

## 2024-01-18 MED ORDER — RIVAROXABAN 20 MG PO TABS
20.0000 mg | ORAL_TABLET | Freq: Every day | ORAL | 6 refills | Status: DC
  Filled 2024-01-18: qty 30, 30d supply, fill #0
  Filled 2024-02-22: qty 30, 30d supply, fill #1

## 2024-01-18 MED ORDER — HEPARIN SOD (PORK) LOCK FLUSH 100 UNIT/ML IV SOLN
500.0000 [IU] | Freq: Once | INTRAVENOUS | Status: AC
  Administered 2024-01-18: 500 [IU] via INTRAVENOUS

## 2024-01-18 NOTE — Patient Instructions (Signed)

## 2024-01-18 NOTE — Progress Notes (Signed)
 Hematology and Oncology Follow Up Visit  Ariana Herrera [id number] 09-19-1966 58 y.o. 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 daily-start on 01/18/2024 Talazoparib 1 mg p.o. daily-start on 12/22/2023   Interim History:  Ariana Herrera is here today for follow-up.  She looks really good.  I am so happy for her.  We did do an MRI of her neck.  She has had nice progression of the metastatic disease.  She still has the thromboembolic disease.  However, this is also improved.  Her left neck is now nice and soft.  I think we can probably stop the Lovenox now and get her onto the Xarelto.  I think this would be a very good idea for her.  It would be a whole lot easier for her.  Her last CA 27.29 was down to 35.  We now have her on tell is operative.  She does have marked thrombocytopenia.  Have to believe this is probably from the talus operative.  I told her to stop this for right now.  She has had no problems with cough.  She is trying to stop smoking.  This is a slow process but yet I know she will stop..  She is does have the diabetes.  Her blood sugars are actually a little bit better.  She has had no change in bowel bladder habits.  She initially had some diarrhea with the tail is operative.  However, this has resolved.  She actually may be a little bit constipated right now.  She has not noted any leg swelling.  There is been no rashes.  She has had no headache.  Overall,  I would say performance status is probably ECOG 1. .   Medications:  Allergies as of 01/18/2024       Reactions   Lithium Other (See Comments)   Spinal fluid built up in brain   Dulaglutide Nausea And Vomiting, Other (See Comments)   TRULICITY   Nitrofuran Derivatives Itching   Nitrofurantoin Mono- MCR   Penicillins Other (See Comments)   UNKNOWN CHILDHOOD REACTION        Medication List        Accurate as of January 18, 2024 11:18 AM. If you have any questions, ask your nurse or doctor.          STOP taking these medications    azithromycin 500 MG tablet Commonly known as: ZITHROMAX Stopped by: [name]   benzonatate 100 MG capsule Commonly known as: TESSALON Stopped by: [name]   clotrimazole 10 MG troche Commonly known as: MYCELEX Stopped by: [name]   doxycycline 100 MG capsule Commonly known as: VIBRAMYCIN Stopped by: [name]   HYDROCODONE-CHLORPHENIRAMINE PO Stopped by: [name]   lidocaine 2 % solution Commonly known as: XYLOCAINE Stopped by: [name]   metroNIDAZOLE 500 MG tablet Commonly known as: FLAGYL Stopped by: [name]   predniSONE 10 MG tablet  Commonly known as: DELTASONE Stopped by: [name]       TAKE these medications    Accu-Chek Guide test strip Generic drug: glucose blood 3 (three) times daily.   albuterol 1.25 MG/3ML nebulizer solution Commonly known as: ACCUNEB Take 1 ampule by nebulization every 6 (six) hours as needed for wheezing or shortness of breath.   albuterol 108 (90 Base) MCG/ACT inhaler Commonly known as: VENTOLIN HFA Inhale 2 puffs into the lungs every 6 (six) hours as needed for wheezing or shortness of breath.   cyclobenzaprine 5 MG tablet Commonly known as: FLEXERIL Take 5 mg by mouth 2 (two) times daily as needed for muscle spasms.   enoxaparin 80 MG/0.8ML injection Commonly known as: LOVENOX Inject 0.8 mLs (80 mg total) into the skin  every 12 (twelve) hours.   fenofibrate 145 MG tablet Commonly known as: TRICOR Take 145 mg by mouth daily.   FreeStyle Libre 3 Sensor Misc Apply 1 sensor every 14 (fourteen) days.   gabapentin 300 MG capsule Commonly known as: NEURONTIN Take 1 capsule (300 mg total) by mouth 3 (three) times daily.   HYDROcodone-acetaminophen 7.5-325 MG tablet Commonly known as: NORCO Take 1 tablet by mouth every 6 (six) hours as needed for moderate pain (pain score 4-6).   hydrOXYzine 25 MG tablet Commonly known as: ATARAX Take 25 mg by mouth 4 (four) times daily.   lidocaine-prilocaine cream Commonly known as: EMLA APPLY TOPICALLY 1 APPLICATION AS NEEDED   linaclotide 290 MCG Caps capsule Commonly known as: Linzess Take 1 capsule (290 mcg total) by mouth daily before breakfast.   metFORMIN 1000 MG tablet Commonly known as: GLUCOPHAGE Take 1,000 mg by mouth 2 (two) times daily with a meal.   metoprolol tartrate 25 MG tablet Commonly known as: LOPRESSOR Take 1 tablet (25 mg total) by mouth 2 (two) times daily.   nitroGLYCERIN 0.4 MG SL tablet Commonly known as: NITROSTAT Place 1 tablet (0.4 mg total) under the tongue every 5 (five) minutes as needed for chest pain.   NovoLOG FlexPen 100 UNIT/ML FlexPen Generic drug: insulin aspart Inject 3 Units into the skin 3 (three) times daily with meals. What changed:  how much to take when to take this additional instructions   Omnipod 5 [id number] Intro Gen 5 Kit To start 02/06/24.   ondansetron 4 MG disintegrating tablet Commonly known as: ZOFRAN-ODT Take 4 mg by mouth every 8 (eight) hours as needed.   ondansetron 8 MG tablet Commonly known as: ZOFRAN Take 1 tablet (8 mg total) by mouth every 8 (eight) hours as needed for nausea or vomiting. START ON THE THIRD DAY AFTER CHEMOTHERAPY.   oxybutynin 5 MG 24 hr tablet Commonly known as: DITROPAN-XL Take 1 tablet (5 mg total) by mouth at bedtime.   pantoprazole 40 MG tablet Commonly  known as: PROTONIX TAKE 1 TABLET BY MOUTH EVERY DAY   PEG 3350 17 g Pack Take 17 g by mouth daily as needed.   prochlorperazine 10 MG tablet Commonly known as: COMPAZINE Take 1 tablet (10 mg total) by mouth every 6 (six) hours as needed for nausea or vomiting (Zofran resumes 12/16).   QUEtiapine 400 MG tablet Commonly known as: SEROQUEL TAKE 2 TABLETS (800 MG TOTAL) BY MOUTH AT BEDTIME.   rosuvastatin 40 MG tablet Commonly known as: CRESTOR Take 1 tablet (40 mg total) by mouth daily.   senna 8.6 MG Tabs tablet Commonly known as: SENOKOT Take 1 tablet (8.6 mg total) by mouth 2 (two) times  daily.   talazoparib tosylate 1 MG capsule Commonly known as: TALZENNA Take 1 capsule (1 mg total) by mouth daily.   Evaristo Bury FlexTouch 200 UNIT/ML FlexTouch Pen Generic drug: insulin degludec Inject 26 Units into the skin at bedtime.        Allergies:  Allergies  Allergen Reactions   Lithium Other (See Comments)    Spinal fluid built up in brain   Dulaglutide Nausea And Vomiting and Other (See Comments)    TRULICITY   Nitrofuran Derivatives Itching    Nitrofurantoin Mono- MCR   Penicillins Other (See Comments)    UNKNOWN CHILDHOOD REACTION    Past Medical History, Surgical history, Social history, and Family History were reviewed and updated.  Review of Systems: Review of Systems  Constitutional:  Positive for malaise/fatigue.  HENT: Negative.    Eyes: Negative.   Respiratory: Negative.    Cardiovascular:  Positive for leg swelling.  Gastrointestinal:  Positive for nausea.  Genitourinary: Negative.   Musculoskeletal:  Positive for joint pain and myalgias.  Skin: Negative.   Neurological:  Positive for tingling.  Endo/Heme/Allergies: Negative.   Psychiatric/Behavioral: Negative.       Physical Exam:  height is 5\' 2"  (1.575 m) and weight is 167 lb 12.8 oz (76.1 kg). Her oral temperature is 98.1 F (36.7 C). Her blood pressure is 91/58 (abnormal) and her pulse is 92.  Her respiration is 20 and oxygen saturation is 99%.   Wt Readings from Last 3 Encounters:  01/18/24 167 lb 12.8 oz (76.1 kg)  12/29/23 168 lb 9.6 oz (76.5 kg)  12/27/23 171 lb 6.4 oz (77.7 kg)    Physical Exam Vitals reviewed.  Constitutional:      Comments: On her breast exam, the right breast is without masses, edema or erythema.  There is no right axillary adenopathy.  Her left breast is somewhat contracted from radiation and surgery.  It is somewhat firm.  It is somewhat swollen although still contracted.  It is tender.  I cannot palpate any adenopathy in the left axilla.  There is no left nipple discharge.  HENT:     Head: Normocephalic and atraumatic.     Comments: She does not have any swelling now.  I do not feel any firmness on the left side of her neck.  No adenopathy is noted.  She has no intraoral lesions.  Eyes:     Pupils: Pupils are equal, round, and reactive to light.  Cardiovascular:     Rate and Rhythm: Normal rate and regular rhythm.     Heart sounds: Normal heart sounds.  Pulmonary:     Effort: Pulmonary effort is normal.     Breath sounds: Normal breath sounds.  Abdominal:     General: Bowel sounds are normal.     Palpations: Abdomen is soft.  Musculoskeletal:        General: No tenderness or deformity. Normal range of motion.     Cervical back: Normal range of motion.  Lymphadenopathy:     Cervical: No cervical adenopathy.  Skin:    General: Skin is warm and dry.     Findings: No erythema or rash.  Neurological:     Mental Status: She is alert and oriented to person, place, and time.  Psychiatric:        Behavior: Behavior normal.        Thought Content: Thought content normal.        Judgment: Judgment normal.     Lab Results  Component  Value Date   WBC 5.0 01/18/2024   HGB 10.3 (L) 01/18/2024   HCT 30.2 (L) 01/18/2024   MCV 98.1 01/18/2024   PLT 47 (L) 01/18/2024   Lab Results  Component Value Date   FERRITIN 33 08/25/2023   IRON 61  08/25/2023   TIBC 400 08/25/2023   UIBC 339 08/25/2023   IRONPCTSAT 15 08/25/2023   Lab Results  Component Value Date   RETICCTPCT 4.0 (H) 08/05/2022   RBC 3.08 (L) 01/18/2024   No results found for: "KPAFRELGTCHN", "LAMBDASER", "KAPLAMBRATIO" No results found for: "IGGSERUM", "IGA", "IGMSERUM" No results found for: "TOTALPROTELP", "ALBUMINELP", "A1GS", "A2GS", "BETS", "BETA2SER", "GAMS", "MSPIKE", "SPEI"   Chemistry      Component Value Date/Time   NA 135 01/18/2024 1009   K 4.2 01/18/2024 1009   CL 102 01/18/2024 1009   CO2 24 01/18/2024 1009   BUN 18 01/18/2024 1009   CREATININE 0.97 01/18/2024 1009      Component Value Date/Time   CALCIUM 9.0 01/18/2024 1009   ALKPHOS 83 01/18/2024 1009   AST 10 (L) 01/18/2024 1009   ALT 13 01/18/2024 1009   BILITOT 0.4 01/18/2024 1009       Impression and Plan: Ariana Herrera is a very pleasant  58 yo caucasian female with recurrent ductal carcinoma of the left breast.  It is amazing that the recurrence is just been in the neck.  Again, the left neck looks a whole lot better.  Her face is not swollen.  I have to believe that the thrombus in the internal jugular vein is resolving.  I also believe that the malignancy is also resolving pushing on her vein.  At some point, we probably do another Doppler of her neck to take a look at the thrombus.  We will hold her tell is operative for right now.  I would like to recheck a CBC on her when she gets back from her vacation.  [place]  It is possible we may have to adjust the Talazoparib  dose down a little bit.  Just happy that she has such a good response to the chemoradiation therapy that we used for her neck recurrence.  Hopefully this also will help with her left breast also.  I will plan to see her back in about 1 month.  She will come back in 2 weeks for lab work only.  i [name], MD 3/26/202511:18 AM

## 2024-01-19 LAB — CANCER ANTIGEN 27.29: CA 27.29: 31.5 U/mL (ref 0.0–38.6)

## 2024-01-20 ENCOUNTER — Other Ambulatory Visit: Payer: Self-pay

## 2024-01-23 ENCOUNTER — Other Ambulatory Visit (HOSPITAL_BASED_OUTPATIENT_CLINIC_OR_DEPARTMENT_OTHER): Payer: Self-pay

## 2024-01-23 ENCOUNTER — Other Ambulatory Visit: Payer: Self-pay | Admitting: Physician Assistant

## 2024-01-25 ENCOUNTER — Other Ambulatory Visit (HOSPITAL_BASED_OUTPATIENT_CLINIC_OR_DEPARTMENT_OTHER): Payer: Self-pay

## 2024-01-25 MED ORDER — ONDANSETRON 4 MG PO TBDP
4.0000 mg | ORAL_TABLET | Freq: Three times a day (TID) | ORAL | 1 refills | Status: DC | PRN
  Filled 2024-01-25: qty 20, 7d supply, fill #0

## 2024-01-26 ENCOUNTER — Other Ambulatory Visit: Payer: Self-pay

## 2024-01-26 ENCOUNTER — Other Ambulatory Visit (HOSPITAL_COMMUNITY): Payer: Medicaid Other

## 2024-01-27 ENCOUNTER — Other Ambulatory Visit: Payer: Self-pay

## 2024-01-30 ENCOUNTER — Encounter: Payer: Self-pay | Admitting: Physician Assistant

## 2024-01-30 ENCOUNTER — Ambulatory Visit (INDEPENDENT_AMBULATORY_CARE_PROVIDER_SITE_OTHER): Admitting: Physician Assistant

## 2024-01-30 VITALS — BP 110/70 | HR 104 | Temp 97.9°F | Ht 62.0 in | Wt 168.8 lb

## 2024-01-30 DIAGNOSIS — J301 Allergic rhinitis due to pollen: Secondary | ICD-10-CM | POA: Diagnosis not present

## 2024-01-30 DIAGNOSIS — J029 Acute pharyngitis, unspecified: Secondary | ICD-10-CM | POA: Diagnosis not present

## 2024-01-30 DIAGNOSIS — R051 Acute cough: Secondary | ICD-10-CM

## 2024-01-30 LAB — POCT RAPID STREP A (OFFICE): Rapid Strep A Screen: NEGATIVE

## 2024-01-30 LAB — POCT INFLUENZA A/B
Influenza A, POC: NEGATIVE
Influenza B, POC: NEGATIVE

## 2024-01-30 LAB — POC COVID19 BINAXNOW: SARS Coronavirus 2 Ag: NEGATIVE

## 2024-01-30 NOTE — Progress Notes (Signed)
 Established patient visit   Patient: Ariana Herrera   DOB: 1966/06/02   58 y.o. Female  MRN: [id number] Visit Date: 01/30/2024  Today's healthcare provider: Alfredia Ferguson, PA-C   Cc. Fatigue, sore throat, nasal congestion  Subjective     Pt reports fatigue, nasal congestion, sore throat, cough x 1 week. She is recovering from pneumonia. She also had to stop chemo 2/2 low plts.   Medications: Outpatient Medications Prior to Visit  Medication Sig   ACCU-CHEK GUIDE test strip 3 (three) times daily.   albuterol (ACCUNEB) 1.25 MG/3ML nebulizer solution Take 1 ampule by nebulization every 6 (six) hours as needed for wheezing or shortness of breath.   albuterol (VENTOLIN HFA) 108 (90 Base) MCG/ACT inhaler Inhale 2 puffs into the lungs every 6 (six) hours as needed for wheezing or shortness of breath.   Continuous Glucose Sensor (FREESTYLE LIBRE 3 SENSOR) MISC Apply 1 sensor every 14 (fourteen) days. (Patient not taking: Reported on 01/18/2024)   cyclobenzaprine (FLEXERIL) 5 MG tablet Take 5 mg by mouth 2 (two) times daily as needed for muscle spasms.   fenofibrate (TRICOR) 145 MG tablet Take 145 mg by mouth daily.   gabapentin (NEURONTIN) 300 MG capsule Take 1 capsule (300 mg total) by mouth 3 (three) times daily. (Patient not taking: Reported on 01/18/2024)   HYDROcodone-acetaminophen (NORCO) 7.5-325 MG tablet Take 1 tablet by mouth every 6 (six) hours as needed for moderate pain (pain score 4-6).   hydrOXYzine (ATARAX) 25 MG tablet Take 25 mg by mouth 4 (four) times daily.   insulin aspart (NOVOLOG FLEXPEN) 100 UNIT/ML FlexPen Inject 3 Units into the skin 3 (three) times daily with meals. (Patient taking differently: Inject 4-14 Units into the skin See admin instructions. Depending on BGL, inject 4-10 units into the skin after breakfast, 6-12 units after lunch, and 8-14 units after supper)   Insulin Disposable Pump (OMNIPOD 5 [id number] INTRO GEN 5) KIT To start 02/06/24.    lidocaine-prilocaine (EMLA) cream APPLY TOPICALLY 1 APPLICATION AS NEEDED   linaclotide (LINZESS) 290 MCG CAPS capsule Take 1 capsule (290 mcg total) by mouth daily before breakfast.   metFORMIN (GLUCOPHAGE) 1000 MG tablet Take 1,000 mg by mouth 2 (two) times daily with a meal.   metoprolol tartrate (LOPRESSOR) 25 MG tablet Take 1 tablet (25 mg total) by mouth 2 (two) times daily.   nitroGLYCERIN (NITROSTAT) 0.4 MG SL tablet Place 1 tablet (0.4 mg total) under the tongue every 5 (five) minutes as needed for chest pain. (Patient not taking: Reported on 01/18/2024)   ondansetron (ZOFRAN) 8 MG tablet Take 1 tablet (8 mg total) by mouth every 8 (eight) hours as needed for nausea or vomiting. START ON THE THIRD DAY AFTER CHEMOTHERAPY.   ondansetron (ZOFRAN-ODT) 4 MG disintegrating tablet Take 1 tablet (4 mg total) by mouth every 8 (eight) hours as needed.   oxybutynin (DITROPAN-XL) 5 MG 24 hr tablet Take 1 tablet (5 mg total) by mouth at bedtime.   pantoprazole (PROTONIX) 40 MG tablet TAKE 1 TABLET BY MOUTH EVERY DAY   polyethylene glycol (MIRALAX / GLYCOLAX) 17 g packet Take 17 g by mouth daily as needed. (Patient not taking: Reported on 01/18/2024)   prochlorperazine (COMPAZINE) 10 MG tablet Take 1 tablet (10 mg total) by mouth every 6 (six) hours as needed for nausea or vomiting (Zofran resumes 12/16).   QUEtiapine (SEROQUEL) 400 MG tablet TAKE 2 TABLETS (800 MG TOTAL) BY MOUTH AT BEDTIME.   rivaroxaban (XARELTO)  20 MG TABS tablet Take 1 tablet (20 mg total) by mouth daily with supper.   rosuvastatin (CRESTOR) 40 MG tablet Take 1 tablet (40 mg total) by mouth daily.   senna (SENOKOT) 8.6 MG TABS tablet Take 1 tablet (8.6 mg total) by mouth 2 (two) times daily.   talazoparib tosylate (TALZENNA) 1 MG capsule Take 1 capsule (1 mg total) by mouth daily.   TRESIBA FLEXTOUCH 200 UNIT/ML FlexTouch Pen Inject 26 Units into the skin at bedtime.   Facility-Administered Medications Prior to Visit  Medication  Dose Route Frequency Provider   sodium chloride flush (NS) 0.9 % injection 10 mL  10 mL Intravenous PRN [name], MD    Review of Systems  Constitutional:  Positive for fatigue. Negative for fever.  HENT:  Positive for congestion and sore throat.   Respiratory:  Positive for cough. Negative for shortness of breath.   Cardiovascular:  Negative for chest pain and leg swelling.  Gastrointestinal:  Negative for abdominal pain.  Neurological:  Negative for dizziness and headaches.       Objective    BP 110/70   Pulse (!) 104   Temp 97.9 F (36.6 C)   Ht 5\' 2"  (1.575 m)   Wt 168 lb 12.8 oz (76.6 kg)   LMP 10/04/2016 Comment: after "procedure to stop bleeding"  SpO2 99%   BMI 30.87 kg/m    Physical Exam Constitutional:      General: She is awake.     Appearance: She is well-developed.  HENT:     Head: Normocephalic.     Mouth/Throat:     Pharynx: Posterior oropharyngeal erythema present.  Eyes:     Conjunctiva/sclera: Conjunctivae normal.  Cardiovascular:     Rate and Rhythm: Normal rate and regular rhythm.     Heart sounds: Normal heart sounds.  Pulmonary:     Effort: Pulmonary effort is normal.     Breath sounds: Wheezing present.  Skin:    General: Skin is warm.  Neurological:     Mental Status: She is alert and oriented to person, place, and time.  Psychiatric:        Attention and Perception: Attention normal.        Mood and Affect: Mood normal.        Speech: Speech normal.        Behavior: Behavior is cooperative.     Results for orders placed or performed in visit on 01/30/24  POCT Influenza A/B  Result Value Ref Range   Influenza A, POC Negative Negative   Influenza B, POC Negative Negative  POCT rapid strep A  Result Value Ref Range   Rapid Strep A Screen Negative Negative  POC COVID-19 BinaxNow  Result Value Ref Range   SARS Coronavirus 2 Ag Negative Negative    Assessment & Plan    Seasonal allergic rhinitis due to pollen, Acute  cough, Sore throat Poc covid, flu, strep negative Fatigue 2/2 to chemo, recovery to pneumonia, allergies Stressed pushing fluids, rest, allergy meds. Wheezing present today, recommending she cont with regular home nebulizer txs .  -     POCT Influenza A/B -     POCT rapid strep A -     POC COVID-19 BinaxNow Return if symptoms worsen or fail to improve.      Alfredia Ferguson, PA-C  [facility] at [facility] [phone] (phone) [phone] (fax)  [facility]

## 2024-02-01 ENCOUNTER — Encounter: Payer: Self-pay | Admitting: Hematology & Oncology

## 2024-02-02 ENCOUNTER — Inpatient Hospital Stay: Attending: Hematology & Oncology

## 2024-02-02 ENCOUNTER — Telehealth: Payer: Self-pay | Admitting: Pharmacy Technician

## 2024-02-02 DIAGNOSIS — C50412 Malignant neoplasm of upper-outer quadrant of left female breast: Secondary | ICD-10-CM | POA: Diagnosis present

## 2024-02-02 DIAGNOSIS — Z171 Estrogen receptor negative status [ER-]: Secondary | ICD-10-CM | POA: Insufficient documentation

## 2024-02-02 DIAGNOSIS — F17200 Nicotine dependence, unspecified, uncomplicated: Secondary | ICD-10-CM | POA: Insufficient documentation

## 2024-02-02 DIAGNOSIS — C50012 Malignant neoplasm of nipple and areola, left female breast: Secondary | ICD-10-CM

## 2024-02-02 DIAGNOSIS — C7989 Secondary malignant neoplasm of other specified sites: Secondary | ICD-10-CM | POA: Insufficient documentation

## 2024-02-02 DIAGNOSIS — Z7901 Long term (current) use of anticoagulants: Secondary | ICD-10-CM | POA: Diagnosis not present

## 2024-02-02 DIAGNOSIS — I82C12 Acute embolism and thrombosis of left internal jugular vein: Secondary | ICD-10-CM | POA: Diagnosis not present

## 2024-02-02 LAB — CBC WITH DIFFERENTIAL (CANCER CENTER ONLY)
Abs Immature Granulocytes: 0.01 10*3/uL (ref 0.00–0.07)
Basophils Absolute: 0 10*3/uL (ref 0.0–0.1)
Basophils Relative: 0 %
Eosinophils Absolute: 0 10*3/uL (ref 0.0–0.5)
Eosinophils Relative: 1 %
HCT: 32.2 % — ABNORMAL LOW (ref 36.0–46.0)
Hemoglobin: 11.2 g/dL — ABNORMAL LOW (ref 12.0–15.0)
Immature Granulocytes: 0 %
Lymphocytes Relative: 28 %
Lymphs Abs: 1 10*3/uL (ref 0.7–4.0)
MCH: 33.8 pg (ref 26.0–34.0)
MCHC: 34.8 g/dL (ref 30.0–36.0)
MCV: 97.3 fL (ref 80.0–100.0)
Monocytes Absolute: 0.6 10*3/uL (ref 0.1–1.0)
Monocytes Relative: 15 %
Neutro Abs: 2 10*3/uL (ref 1.7–7.7)
Neutrophils Relative %: 56 %
Platelet Count: 111 10*3/uL — ABNORMAL LOW (ref 150–400)
RBC: 3.31 MIL/uL — ABNORMAL LOW (ref 3.87–5.11)
RDW: 16.7 % — ABNORMAL HIGH (ref 11.5–15.5)
WBC Count: 3.6 10*3/uL — ABNORMAL LOW (ref 4.0–10.5)
nRBC: 0 % (ref 0.0–0.2)

## 2024-02-02 NOTE — Telephone Encounter (Signed)
 Oral Oncology Patient Advocate Encounter  Received a fax from [facility] stating that pt told them she would be holding Talzen[place].   I looked in her chart to see if there was any information regarding this and did not find anything. I then contacted the nurse that is working with Dr. Myna Hidalgo and she stated that Dr. Myna Hidalgo did not tell pt to hold it.   Patient had an appointment today and saw another nurse for Dr. Myna Hidalgo, the pt told nurse that Dr. Myna Hidalgo had initially told her to hold it so the nurse spoke with Dr. Myna Hidalgo again and confirmed that he did in fact tell her to hold the medication but would like her to start taking medication again.  Patient knows to continue taking medication.  [name], CPhT Oncology Pharmacy Patient Advocate [facility] Direct Number: [phone] Fax: [phone]

## 2024-02-06 ENCOUNTER — Encounter: Payer: Self-pay | Admitting: *Deleted

## 2024-02-06 ENCOUNTER — Ambulatory Visit (HOSPITAL_BASED_OUTPATIENT_CLINIC_OR_DEPARTMENT_OTHER)
Admission: RE | Admit: 2024-02-06 | Discharge: 2024-02-06 | Disposition: A | Discharge status: Home / Self Care | Source: Ambulatory Visit | Attending: Hematology & Oncology | Admitting: Hematology & Oncology

## 2024-02-06 ENCOUNTER — Other Ambulatory Visit: Payer: Self-pay

## 2024-02-06 ENCOUNTER — Inpatient Hospital Stay (HOSPITAL_BASED_OUTPATIENT_CLINIC_OR_DEPARTMENT_OTHER): Admitting: Hematology & Oncology

## 2024-02-06 ENCOUNTER — Encounter: Payer: Self-pay | Admitting: Hematology & Oncology

## 2024-02-06 ENCOUNTER — Inpatient Hospital Stay

## 2024-02-06 VITALS — BP 117/55 | HR 104 | Temp 100.5°F | Resp 18 | Ht 62.0 in | Wt 162.0 lb

## 2024-02-06 DIAGNOSIS — T451X5A Adverse effect of antineoplastic and immunosuppressive drugs, initial encounter: Secondary | ICD-10-CM

## 2024-02-06 DIAGNOSIS — C50012 Malignant neoplasm of nipple and areola, left female breast: Secondary | ICD-10-CM

## 2024-02-06 LAB — CMP (CANCER CENTER ONLY)
ALT: 13 U/L (ref 0–44)
AST: 16 U/L (ref 15–41)
Albumin: 4.2 g/dL (ref 3.5–5.0)
Alkaline Phosphatase: 97 U/L (ref 38–126)
Anion gap: 12 (ref 5–15)
BUN: 20 mg/dL (ref 6–20)
CO2: 23 mmol/L (ref 22–32)
Calcium: 9.5 mg/dL (ref 8.9–10.3)
Chloride: 104 mmol/L (ref 98–111)
Creatinine: 1.2 mg/dL — ABNORMAL HIGH (ref 0.44–1.00)
GFR, Estimated: 53 mL/min — ABNORMAL LOW (ref >60)
Glucose, Bld: 142 mg/dL — ABNORMAL HIGH (ref 70–99)
Potassium: 3.6 mmol/L (ref 3.5–5.1)
Sodium: 139 mmol/L (ref 135–145)
Total Bilirubin: 0.4 mg/dL (ref 0.0–1.2)
Total Protein: 7 g/dL (ref 6.5–8.1)

## 2024-02-06 LAB — CBC WITH DIFFERENTIAL (CANCER CENTER ONLY)
Abs Immature Granulocytes: 0.02 10*3/uL (ref 0.00–0.07)
Basophils Absolute: 0 10*3/uL (ref 0.0–0.1)
Basophils Relative: 0 %
Eosinophils Absolute: 0.1 10*3/uL (ref 0.0–0.5)
Eosinophils Relative: 1 %
HCT: 32.7 % — ABNORMAL LOW (ref 36.0–46.0)
Hemoglobin: 11.4 g/dL — ABNORMAL LOW (ref 12.0–15.0)
Immature Granulocytes: 0 %
Lymphocytes Relative: 24 %
Lymphs Abs: 1.3 10*3/uL (ref 0.7–4.0)
MCH: 33.3 pg (ref 26.0–34.0)
MCHC: 34.9 g/dL (ref 30.0–36.0)
MCV: 95.6 fL (ref 80.0–100.0)
Monocytes Absolute: 0.6 10*3/uL (ref 0.1–1.0)
Monocytes Relative: 12 %
Neutro Abs: 3.4 10*3/uL (ref 1.7–7.7)
Neutrophils Relative %: 63 %
Platelet Count: 180 10*3/uL (ref 150–400)
RBC: 3.42 MIL/uL — ABNORMAL LOW (ref 3.87–5.11)
RDW: 16.5 % — ABNORMAL HIGH (ref 11.5–15.5)
WBC Count: 5.5 10*3/uL (ref 4.0–10.5)
nRBC: 0 % (ref 0.0–0.2)

## 2024-02-06 LAB — TSH: TSH: 1.144 u[IU]/mL (ref 0.350–4.500)

## 2024-02-06 LAB — LACTATE DEHYDROGENASE: LDH: 122 U/L (ref 98–192)

## 2024-02-06 NOTE — Progress Notes (Deleted)
 Nothing here about that Hematology and Oncology Follow Up Visit  Ariana Herrera [id number] 03/16/1966 58 y.o. 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  She really looks quite good.  The left side of her neck looks a whole lot better.  It is soft.  I do not feel a firm mass now.  Her last CA 27.29 was down to 42.  I have to believe that she has had a very nice response with the chemo/radiotherapy.  She finishes up little over a month ago.  I think be worthwhile to set her up with a MRI to see how everything looks now.  She continues on the Lovenox for her thrombus.  We will see how that looks also.  Maybe, we can switch her over to something oral.  She is still smoking.  She smokes a pack and a half a day.  Her blood sugars are on the high side.  Both of these will certainly be a problem down the road.  She has had no problems with her left breast.  This appears to be less swollen.  There is no change in bowel or bladder habits.  She has had no cough.  She has had no nausea or vomiting.  Her dad is in from [place].  She has had a nice time with him.  She has had no bleeding.  Currently, I would say that her performance status is probably ECOG 1.   Medications:  Allergies as of 02/06/2024       Reactions   Lithium Other (See Comments)   Spinal fluid built up in brain   Dulaglutide Nausea And Vomiting, Other (See Comments)   TRULICITY    Nitrofuran Derivatives Itching   Nitrofurantoin Mono- MCR   Penicillins Other (See Comments)   UNKNOWN CHILDHOOD REACTION        Medication List        Accurate as of February 06, 2024  4:32 PM. If you have any questions, ask your nurse or doctor.          Accu-Chek Guide test strip Generic drug: glucose blood 3 (three) times daily.   albuterol 1.25 MG/3ML nebulizer solution Commonly known as: ACCUNEB Take 1 ampule by nebulization every 6 (six) hours as needed for wheezing or shortness of breath.   albuterol 108 (90 Base) MCG/ACT inhaler Commonly known as: VENTOLIN HFA Inhale 2 puffs into the lungs every 6 (six) hours as needed for wheezing or shortness of breath.   cyclobenzaprine 5 MG tablet Commonly known as: FLEXERIL Take 5 mg by mouth 2 (two) times daily as needed for muscle spasms.   fenofibrate 145 MG tablet Commonly known as: TRICOR Take 145 mg by mouth daily.   FreeStyle Libre 3 Sensor Misc Apply 1 sensor every 14 (fourteen) days.   gabapentin 300 MG capsule Commonly known as: NEURONTIN Take 1 capsule (300 mg total) by mouth 3 (three) times daily.   HYDROcodone-acetaminophen  7.5-325 MG tablet Commonly known as: NORCO Take 1 tablet by mouth every 6 (six) hours as needed for moderate pain (pain score 4-6).   hydrOXYzine 25 MG tablet Commonly known as: ATARAX Take 25 mg by mouth 4 (four) times daily.   lidocaine-prilocaine cream Commonly known as: EMLA APPLY TOPICALLY 1 APPLICATION AS NEEDED   linaclotide 290 MCG Caps capsule Commonly known as: Linzess Take 1 capsule (290 mcg total) by mouth daily before breakfast.   metFORMIN 1000 MG tablet Commonly known as: GLUCOPHAGE Take 1,000 mg by mouth 2 (two) times daily with a meal.   metoprolol tartrate 25 MG tablet Commonly known as: LOPRESSOR Take 1 tablet (25 mg total) by mouth 2 (two) times daily.   nitroGLYCERIN 0.4 MG SL tablet Commonly known as: NITROSTAT Place 1 tablet (0.4 mg total) under  the tongue every 5 (five) minutes as needed for chest pain.   NovoLOG FlexPen 100 UNIT/ML FlexPen Generic drug: insulin aspart Inject 3 Units into the skin 3 (three) times daily with meals. What changed:  how much to take when to take this additional instructions   Omnipod 5 DexG7G6 Intro Gen 5 Kit To start 02/06/24.   ondansetron 4 MG disintegrating tablet Commonly known as: ZOFRAN-ODT Take 1 tablet (4 mg total) by mouth every 8 (eight) hours as needed.   ondansetron 8 MG tablet Commonly known as: ZOFRAN Take 1 tablet (8 mg total) by mouth every 8 (eight) hours as needed for nausea or vomiting. START ON THE THIRD DAY AFTER CHEMOTHERAPY.   oxybutynin 5 MG 24 hr tablet Commonly known as: DITROPAN-XL Take 1 tablet (5 mg total) by mouth at bedtime.   pantoprazole 40 MG tablet Commonly known as: PROTONIX TAKE 1 TABLET BY MOUTH EVERY DAY   PEG 3350 17 g Pack Take 17 g by mouth daily as needed.   prochlorperazine 10 MG tablet Commonly known as: COMPAZINE Take 1 tablet (10 mg total) by mouth every 6 (six) hours as needed for nausea or vomiting (Zofran resumes 12/16).   QUEtiapine 400 MG tablet Commonly known as: SEROQUEL TAKE 2 TABLETS (800 MG TOTAL) BY MOUTH AT BEDTIME.   rosuvastatin 40 MG tablet Commonly known as: CRESTOR Take 1 tablet (40 mg total) by mouth daily.   senna 8.6 MG Tabs tablet Commonly known as: SENOKOT Take 1 tablet (8.6 mg total) by mouth 2 (two) times daily.   talazoparib tosylate 1 MG capsule Commonly known as: TALZENNA Take 1 capsule (1 mg total) by mouth daily.   Evaristo Bury FlexTouch 200 UNIT/ML FlexTouch Pen Generic drug: insulin degludec Inject 26 Units into the skin at bedtime.   Xarelto 20 MG Tabs tablet Generic drug: rivaroxaban Take 1 tablet (20 mg total) by mouth daily with supper.        Allergies:  Allergies  Allergen Reactions  . Lithium Other (See Comments)    Spinal fluid built up in brain  . Dulaglutide Nausea And Vomiting  and Other (See Comments)    TRULICITY  . Nitrofuran Derivatives Itching    Nitrofurantoin Mono- MCR  . Penicillins Other (See Comments)    UNKNOWN CHILDHOOD REACTION    Past Medical History, Surgical history, Social history, and Family History were reviewed and updated.  Review of Systems: Review of Systems  Constitutional:  Positive for malaise/fatigue.  HENT: Negative.    Eyes: Negative.   Respiratory: Negative.    Cardiovascular:  Positive for leg swelling.  Gastrointestinal:  Positive for nausea.  Genitourinary: Negative.   Musculoskeletal:  Positive for joint pain and myalgias.  Skin: Negative.   Neurological:  Positive for tingling.  Endo/Heme/Allergies: Negative.   Psychiatric/Behavioral: Negative.       Physical Exam:  height is 5\' 2"  (1.575 m) and weight is 162 lb (73.5 kg). Her oral temperature is 98.5 F (36.9 C) (pended). Her blood pressure is 117/55 (abnormal) and her pulse is 104 (abnormal). Her respiration is 18 and oxygen saturation is 100% (pended).   Wt Readings from Last 3 Encounters:  02/06/24 162 lb (73.5 kg)  01/30/24 168 lb 12.8 oz (76.6 kg)  01/18/24 167 lb 12.8 oz (76.1 kg)    Physical Exam Vitals reviewed.  Constitutional:      Comments: On her breast exam, the right breast is without masses, edema or erythema.  There is no right axillary adenopathy.  Her left breast is somewhat contracted from radiation and surgery.  It is somewhat firm.  It is somewhat swollen although still contracted.  It is tender.  I cannot palpate any adenopathy in the left axilla.  There is no left nipple discharge.  HENT:     Head: Normocephalic and atraumatic.     Comments: She does not have any swelling now.  I do not feel any firmness on the left side of her neck.  No adenopathy is noted.  She has no intraoral lesions.  Eyes:     Pupils: Pupils are equal, round, and reactive to light.  Cardiovascular:     Rate and Rhythm: Normal rate and regular rhythm.     Heart  sounds: Normal heart sounds.  Pulmonary:     Effort: Pulmonary effort is normal.     Breath sounds: Normal breath sounds.  Abdominal:     General: Bowel sounds are normal.     Palpations: Abdomen is soft.  Musculoskeletal:        General: No tenderness or deformity. Normal range of motion.     Cervical back: Normal range of motion.  Lymphadenopathy:     Cervical: No cervical adenopathy.  Skin:    General: Skin is warm and dry.     Findings: No erythema or rash.  Neurological:     Mental Status: She is alert and oriented to person, place, and time.  Psychiatric:        Behavior: Behavior normal.        Thought Content: Thought content normal.        Judgment: Judgment normal.    Lab Results  Component Value Date   WBC 5.5 02/06/2024   HGB 11.4 (L) 02/06/2024   HCT 32.7 (L) 02/06/2024   MCV 95.6 02/06/2024   PLT 180 02/06/2024   Lab Results  Component Value Date   FERRITIN 33 08/25/2023   IRON 61 08/25/2023   TIBC 400 08/25/2023   UIBC 339 08/25/2023   IRONPCTSAT 15 08/25/2023   Lab Results  Component Value Date   RETICCTPCT 4.0 (H) 08/05/2022   RBC 3.42 (L) 02/06/2024   No results found for: "KPAFRELGTCHN", "LAMBDASER", "KAPLAMBRATIO" No results found for: "IGGSERUM", "IGA", "IGMSERUM" No results found for: "TOTALPROTELP", "ALBUMINELP", "A1GS", "A2GS", "BETS", "BETA2SER", "GAMS", "MSPIKE", "SPEI"   Chemistry      Component Value Date/Time   NA 139 02/06/2024 1521   K 3.6 02/06/2024 1521   CL 104 02/06/2024 1521   CO2 23 02/06/2024 1521   BUN 20 02/06/2024 1521   CREATININE 1.20 (H) 02/06/2024 1521      Component Value Date/Time   CALCIUM 9.5 02/06/2024  1521   ALKPHOS 97 02/06/2024 1521   AST 16 02/06/2024 1521   ALT 13 02/06/2024 1521   BILITOT 0.4 02/06/2024 1521       Impression and Plan: Ms. Macdowell is a very pleasant  58 yo caucasian female with recurrent ductal carcinoma of the left breast.  It is amazing that the recurrence is just been in the  neck.  Again, the left neck looks a whole lot better.  Her face is not swollen.  I have to believe that the thrombus in the internal jugular vein is resolving.  I also believe that the malignancy is also resolving pushing on her vein.  Of note, she does have a tumor that is HRD +.  As such, I will use a PARP Inhibitor.  I would like to use talazoparib.  I think this is reasonable.  Hopefully her insurance will let us  use this.  We will see about the MRI.  I will set this up in 2 weeks.  I am just happy that her quality of life is better now.  I just wish that she would stop smoking.  I think this will be our biggest problem.  I will like to see her back in a month.  She will finish of the radiation this week.  I probably would not do any scans on her for at least 2 months.  However, we really need to get her on systemic chemotherapy so that we can try to prevent progression of disease systemically.  Given that she has triple negative disease, I would certainly use immunotherapy along with chemotherapy.  I would like to have her come back in a month.  At that point time, I will discuss with her how we can treat her systemically.  Again she will do Lovenox at 80 mg twice a day.  We will probably check a Doppler of her left neck at some point couple months.  Hopefully, she will continue to try to cut back on smoking.    Ivor Mars, MD 4/14/20254:32 PM

## 2024-02-06 NOTE — Progress Notes (Signed)
 Hematology and Oncology Follow Up Visit  Ariana Herrera [id number] 1966/03/27 58 y.o. 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 daily-start on 01/18/2024 Talazoparib 1 mg p.o. daily-start on 12/22/2023   Interim History:  Ms. Ariana Herrera is here today for an early visit.  She calls in that she does feel very weak.  She states she was weak in her legs.  She is still smoking.  She and her husband are cut back.  She started the to Talazoparib about 4 or 5 days ago.  She was confused as to whether or not she needed to start it.  I think the confusion occurred when she was to stop the Lovenox.  She stopped the Lovenox and is now on Xarelto..  She now has a insulin pump that is without wires and tubes.  This is quite nifty.  She is doing well on the Xarelto.  She does have some bruising on her legs.  She has a lot of pain in the right hip area.  I am not sure exactly what could be going on.  We will go ahead and get an x-ray of that area to make sure there is no evidence of any metastatic disease.  When we last saw her, her CA 27.29 was down to 31.    Her appetite is doing okay.  She has had no nausea or vomiting.  There is no change in bowel or bladder habits.  She is on Linzess and still has a little bit of constipation.  There is been no cough.  Overall, I would have to say that her performance status is probably ECOG 1.   .   Medications:  Allergies as of 02/06/2024        Reactions   Lithium Other (See Comments)   Spinal fluid built up in brain   Dulaglutide Nausea And Vomiting, Other (See Comments)   TRULICITY   Nitrofuran Derivatives Itching   Nitrofurantoin Mono- MCR   Penicillins Other (See Comments)   UNKNOWN CHILDHOOD REACTION        Medication List        Accurate as of February 06, 2024  4:53 PM. If you have any questions, ask your nurse or doctor.          Accu-Chek Guide test strip Generic drug: glucose blood 3 (three) times daily.   albuterol 1.25 MG/3ML nebulizer solution Commonly known as: ACCUNEB Take 1 ampule by nebulization every 6 (six) hours as needed for wheezing or shortness of breath.   albuterol 108 (90 Base) MCG/ACT inhaler Commonly known as: VENTOLIN HFA Inhale 2 puffs into the lungs every 6 (six) hours as needed for wheezing or shortness of breath.   cyclobenzaprine 5 MG tablet Commonly known as: FLEXERIL Take 5 mg by mouth 2 (two) times daily as needed for muscle spasms.   fenofibrate 145 MG tablet Commonly known as: TRICOR Take 145 mg by mouth daily.   FreeStyle [employer] 3 Sensor Misc Apply  1 sensor every 14 (fourteen) days.   gabapentin 300 MG capsule Commonly known as: NEURONTIN Take 1 capsule (300 mg total) by mouth 3 (three) times daily.   HYDROcodone-acetaminophen 7.5-325 MG tablet Commonly known as: NORCO Take 1 tablet by mouth every 6 (six) hours as needed for moderate pain (pain score 4-6).   hydrOXYzine 25 MG tablet Commonly known as: ATARAX Take 25 mg by mouth 4 (four) times daily.   lidocaine-prilocaine cream Commonly known as: EMLA APPLY TOPICALLY 1 APPLICATION AS NEEDED   linaclotide 290 MCG Caps capsule Commonly known as: Linzess Take 1 capsule (290 mcg total) by mouth daily before breakfast.   metFORMIN 1000 MG tablet Commonly known as: GLUCOPHAGE Take 1,000 mg by mouth 2 (two) times daily with a meal.   metoprolol tartrate 25 MG tablet Commonly known as: LOPRESSOR Take 1  tablet (25 mg total) by mouth 2 (two) times daily.   nitroGLYCERIN 0.4 MG SL tablet Commonly known as: NITROSTAT Place 1 tablet (0.4 mg total) under the tongue every 5 (five) minutes as needed for chest pain.   NovoLOG FlexPen 100 UNIT/ML FlexPen Generic drug: insulin aspart Inject 3 Units into the skin 3 (three) times daily with meals. What changed:  how much to take when to take this additional instructions   Omnipod 5 DexG7G6 Intro Gen 5 Kit To start 02/06/24.   ondansetron 4 MG disintegrating tablet Commonly known as: ZOFRAN-ODT Take 1 tablet (4 mg total) by mouth every 8 (eight) hours as needed.   ondansetron 8 MG tablet Commonly known as: ZOFRAN Take 1 tablet (8 mg total) by mouth every 8 (eight) hours as needed for nausea or vomiting. START ON THE THIRD DAY AFTER CHEMOTHERAPY.   oxybutynin 5 MG 24 hr tablet Commonly known as: DITROPAN-XL Take 1 tablet (5 mg total) by mouth at bedtime.   pantoprazole 40 MG tablet Commonly known as: PROTONIX TAKE 1 TABLET BY MOUTH EVERY DAY   PEG 3350 17 g Pack Take 17 g by mouth daily as needed.   prochlorperazine 10 MG tablet Commonly known as: COMPAZINE Take 1 tablet (10 mg total) by mouth every 6 (six) hours as needed for nausea or vomiting (Zofran resumes 12/16).   QUEtiapine 400 MG tablet Commonly known as: SEROQUEL TAKE 2 TABLETS (800 MG TOTAL) BY MOUTH AT BEDTIME.   rosuvastatin 40 MG tablet Commonly known as: CRESTOR Take 1 tablet (40 mg total) by mouth daily.   senna 8.6 MG Tabs tablet Commonly known as: SENOKOT Take 1 tablet (8.6 mg total) by mouth 2 (two) times daily.   talazoparib tosylate 1 MG capsule Commonly known as: TALZENNA Take 1 capsule (1 mg total) by mouth daily.   Tresiba FlexTouch 200 UNIT/ML FlexTouch Pen Generic drug: insulin degludec Inject 26 Units into the skin at bedtime.   Xarelto 20 MG Tabs tablet Generic drug: rivaroxaban Take 1 tablet (20 mg total) by mouth daily with supper.         Allergies:  Allergies  Allergen Reactions   Lithium Other (See Comments)    Spinal fluid built up in brain   Dulaglutide Nausea And Vomiting and Other (See Comments)    TRULICITY   Nitrofuran Derivatives Itching    Nitrofurantoin Mono- MCR   Penicillins Other (See Comments)    UNKNOWN CHILDHOOD REACTION    Past Medical History, Surgical history, Social history, and Family History were reviewed and updated.  Review of Systems: Review of Systems  Constitutional:  Positive for malaise/fatigue.  HENT: Negative.  Eyes: Negative.   Respiratory: Negative.    Cardiovascular:  Positive for leg swelling.  Gastrointestinal:  Positive for nausea.  Genitourinary: Negative.   Musculoskeletal:  Positive for joint pain and myalgias.  Skin: Negative.   Neurological:  Positive for tingling.  Endo/Heme/Allergies: Negative.   Psychiatric/Behavioral: Negative.       Physical Exam:  height is 5\' 2"  (1.575 m) and weight is 162 lb (73.5 kg). Her oral temperature is 98.5 F (36.9 C) (pended). Her blood pressure is 117/55 (abnormal) and her pulse is 104 (abnormal). Her respiration is 18 and oxygen saturation is 100% (pended).   Wt Readings from Last 3 Encounters:  02/06/24 162 lb (73.5 kg)  01/30/24 168 lb 12.8 oz (76.6 kg)  01/18/24 167 lb 12.8 oz (76.1 kg)    Physical Exam Vitals reviewed.  Constitutional:      Comments: On her breast exam, the right breast is without masses, edema or erythema.  There is no right axillary adenopathy.  Her left breast is somewhat contracted from radiation and surgery.  It is somewhat firm.  It is somewhat swollen although still contracted.  It is tender.  I cannot palpate any adenopathy in the left axilla.  There is no left nipple discharge.  HENT:     Head: Normocephalic and atraumatic.     Comments: She does not have any swelling now.  I do not feel any firmness on the left side of her neck.  No adenopathy is noted.  She has no intraoral lesions.   Eyes:     Pupils: Pupils are equal, round, and reactive to light.  Cardiovascular:     Rate and Rhythm: Normal rate and regular rhythm.     Heart sounds: Normal heart sounds.  Pulmonary:     Effort: Pulmonary effort is normal.     Breath sounds: Normal breath sounds.  Abdominal:     General: Bowel sounds are normal.     Palpations: Abdomen is soft.  Musculoskeletal:        General: No tenderness or deformity. Normal range of motion.     Cervical back: Normal range of motion.  Lymphadenopathy:     Cervical: No cervical adenopathy.  Skin:    General: Skin is warm and dry.     Findings: No erythema or rash.  Neurological:     Mental Status: She is alert and oriented to person, place, and time.  Psychiatric:        Behavior: Behavior normal.        Thought Content: Thought content normal.        Judgment: Judgment normal.     Lab Results  Component Value Date   WBC 5.5 02/06/2024   HGB 11.4 (L) 02/06/2024   HCT 32.7 (L) 02/06/2024   MCV 95.6 02/06/2024   PLT 180 02/06/2024   Lab Results  Component Value Date   FERRITIN 33 08/25/2023   IRON 61 08/25/2023   TIBC 400 08/25/2023   UIBC 339 08/25/2023   IRONPCTSAT 15 08/25/2023   Lab Results  Component Value Date   RETICCTPCT 4.0 (H) 08/05/2022   RBC 3.42 (L) 02/06/2024   No results found for: "KPAFRELGTCHN", "LAMBDASER", "KAPLAMBRATIO" No results found for: "IGGSERUM", "IGA", "IGMSERUM" No results found for: "TOTALPROTELP", "ALBUMINELP", "A1GS", "A2GS", "BETS", "BETA2SER", "GAMS", "MSPIKE", "SPEI"   Chemistry      Component Value Date/Time   NA 139 02/06/2024 1521   K 3.6 02/06/2024 1521   CL 104 02/06/2024 1521   CO2  23 02/06/2024 1521   BUN 20 02/06/2024 1521   CREATININE 1.20 (H) 02/06/2024 1521      Component Value Date/Time   CALCIUM 9.5 02/06/2024 1521   ALKPHOS 97 02/06/2024 1521   AST 16 02/06/2024 1521   ALT 13 02/06/2024 1521   BILITOT 0.4 02/06/2024 1521       Impression and Plan: Ms.  Houchin is a very pleasant  58 yo caucasian female with recurrent ductal carcinoma of the left breast.  It is amazing that the recurrence was just been in the neck.  Again, I am unsure as to what the problem is with her legs.  Again she has some pain when I palpate the right hip.  We will get some plain x-rays on her today.  This weakness he was started before she even started the Talazoparib.  As such, I will keep him on the talus operative is I think this will definitely help her out.  I am supposed to see her back next week for her regular appointment.  We will keep that appointment.  Again, her labs do look all that bad.  I am not sure exactly what is going on with expected the legs.  I really cannot find any weakness when I examine her.  Again she just had the pain in the right hip which we are going to evaluate with a x-ray.  i Ivor Mars, MD 4/14/20254:53 PM

## 2024-02-07 LAB — CANCER ANTIGEN 27.29: CA 27.29: 35.1 U/mL (ref 0.0–38.6)

## 2024-02-10 ENCOUNTER — Encounter: Payer: Self-pay | Admitting: *Deleted

## 2024-02-16 ENCOUNTER — Other Ambulatory Visit (HOSPITAL_BASED_OUTPATIENT_CLINIC_OR_DEPARTMENT_OTHER): Payer: Self-pay

## 2024-02-16 ENCOUNTER — Other Ambulatory Visit: Payer: Self-pay

## 2024-02-16 ENCOUNTER — Other Ambulatory Visit: Payer: Self-pay | Admitting: Hematology & Oncology

## 2024-02-16 ENCOUNTER — Inpatient Hospital Stay

## 2024-02-16 ENCOUNTER — Inpatient Hospital Stay (HOSPITAL_BASED_OUTPATIENT_CLINIC_OR_DEPARTMENT_OTHER): Admitting: Hematology & Oncology

## 2024-02-16 ENCOUNTER — Encounter: Payer: Self-pay | Admitting: Hematology & Oncology

## 2024-02-16 VITALS — BP 98/59 | HR 59 | Temp 97.8°F | Resp 18 | Ht 62.0 in | Wt 165.0 lb

## 2024-02-16 DIAGNOSIS — R112 Nausea with vomiting, unspecified: Secondary | ICD-10-CM

## 2024-02-16 DIAGNOSIS — C50011 Malignant neoplasm of nipple and areola, right female breast: Secondary | ICD-10-CM

## 2024-02-16 DIAGNOSIS — Z170 Estrogen receptor positive status [ER+]: Secondary | ICD-10-CM

## 2024-02-16 DIAGNOSIS — I82C12 Acute embolism and thrombosis of left internal jugular vein: Secondary | ICD-10-CM

## 2024-02-16 DIAGNOSIS — C50212 Malignant neoplasm of upper-inner quadrant of left female breast: Secondary | ICD-10-CM

## 2024-02-16 DIAGNOSIS — Z86718 Personal history of other venous thrombosis and embolism: Secondary | ICD-10-CM

## 2024-02-16 DIAGNOSIS — G939 Disorder of brain, unspecified: Secondary | ICD-10-CM

## 2024-02-16 DIAGNOSIS — E1165 Type 2 diabetes mellitus with hyperglycemia: Secondary | ICD-10-CM

## 2024-02-16 DIAGNOSIS — Z95828 Presence of other vascular implants and grafts: Secondary | ICD-10-CM

## 2024-02-16 DIAGNOSIS — C50012 Malignant neoplasm of nipple and areola, left female breast: Secondary | ICD-10-CM

## 2024-02-16 DIAGNOSIS — G620 Drug-induced polyneuropathy: Secondary | ICD-10-CM

## 2024-02-16 DIAGNOSIS — R600 Localized edema: Secondary | ICD-10-CM

## 2024-02-16 DIAGNOSIS — C770 Secondary and unspecified malignant neoplasm of lymph nodes of head, face and neck: Secondary | ICD-10-CM

## 2024-02-16 DIAGNOSIS — Z171 Estrogen receptor negative status [ER-]: Secondary | ICD-10-CM

## 2024-02-16 DIAGNOSIS — R220 Localized swelling, mass and lump, head: Secondary | ICD-10-CM

## 2024-02-16 DIAGNOSIS — T451X5A Adverse effect of antineoplastic and immunosuppressive drugs, initial encounter: Secondary | ICD-10-CM

## 2024-02-16 DIAGNOSIS — N61 Mastitis without abscess: Secondary | ICD-10-CM

## 2024-02-16 DIAGNOSIS — C50019 Malignant neoplasm of nipple and areola, unspecified female breast: Secondary | ICD-10-CM

## 2024-02-16 DIAGNOSIS — W57XXXA Bitten or stung by nonvenomous insect and other nonvenomous arthropods, initial encounter: Secondary | ICD-10-CM

## 2024-02-16 DIAGNOSIS — C7989 Secondary malignant neoplasm of other specified sites: Secondary | ICD-10-CM | POA: Diagnosis not present

## 2024-02-16 LAB — CBC WITH DIFFERENTIAL (CANCER CENTER ONLY)
Abs Immature Granulocytes: 0.01 10*3/uL (ref 0.00–0.07)
Basophils Absolute: 0 10*3/uL (ref 0.0–0.1)
Basophils Relative: 0 %
Eosinophils Absolute: 0 10*3/uL (ref 0.0–0.5)
Eosinophils Relative: 1 %
HCT: 26.1 % — ABNORMAL LOW (ref 36.0–46.0)
Hemoglobin: 9.1 g/dL — ABNORMAL LOW (ref 12.0–15.0)
Immature Granulocytes: 0 %
Lymphocytes Relative: 22 %
Lymphs Abs: 0.6 10*3/uL — ABNORMAL LOW (ref 0.7–4.0)
MCH: 34.1 pg — ABNORMAL HIGH (ref 26.0–34.0)
MCHC: 34.9 g/dL (ref 30.0–36.0)
MCV: 97.8 fL (ref 80.0–100.0)
Monocytes Absolute: 0.2 10*3/uL (ref 0.1–1.0)
Monocytes Relative: 7 %
Neutro Abs: 2 10*3/uL (ref 1.7–7.7)
Neutrophils Relative %: 70 %
Platelet Count: 39 10*3/uL — ABNORMAL LOW (ref 150–400)
RBC: 2.67 MIL/uL — ABNORMAL LOW (ref 3.87–5.11)
RDW: 16.3 % — ABNORMAL HIGH (ref 11.5–15.5)
WBC Count: 2.8 10*3/uL — ABNORMAL LOW (ref 4.0–10.5)
nRBC: 0 % (ref 0.0–0.2)

## 2024-02-16 LAB — CMP (CANCER CENTER ONLY)
ALT: 11 U/L (ref 0–44)
AST: 10 U/L — ABNORMAL LOW (ref 15–41)
Albumin: 3.9 g/dL (ref 3.5–5.0)
Alkaline Phosphatase: 80 U/L (ref 38–126)
Anion gap: 10 (ref 5–15)
BUN: 23 mg/dL — ABNORMAL HIGH (ref 6–20)
CO2: 22 mmol/L (ref 22–32)
Calcium: 9.1 mg/dL (ref 8.9–10.3)
Chloride: 103 mmol/L (ref 98–111)
Creatinine: 1.06 mg/dL — ABNORMAL HIGH (ref 0.44–1.00)
GFR, Estimated: >60 mL/min (ref >60)
Glucose, Bld: 85 mg/dL (ref 70–99)
Potassium: 3.9 mmol/L (ref 3.5–5.1)
Sodium: 135 mmol/L (ref 135–145)
Total Bilirubin: 0.3 mg/dL (ref 0.0–1.2)
Total Protein: 6.3 g/dL — ABNORMAL LOW (ref 6.5–8.1)

## 2024-02-16 MED ORDER — OLANZAPINE 10 MG PO TABS: 10.0000 mg | ORAL_TABLET | Freq: Every day | ORAL | 4 refills | Status: DC

## 2024-02-16 MED ORDER — SODIUM CHLORIDE 0.9% FLUSH: 10.0000 mL | INTRAVENOUS | Status: DC | PRN

## 2024-02-16 MED ORDER — HYDROCODONE-ACETAMINOPHEN 7.5-325 MG PO TABS
1.0000 | ORAL_TABLET | Freq: Four times a day (QID) | ORAL | 0 refills | Status: DC | PRN
  Filled 2024-02-16: qty 120, 30d supply, fill #0

## 2024-02-16 MED ORDER — HEPARIN SOD (PORK) LOCK FLUSH 100 UNIT/ML IV SOLN
500.0000 [IU] | Freq: Once | INTRAVENOUS | Status: DC
Start: 2024-02-16 — End: 2024-02-16

## 2024-02-16 MED ORDER — TALAZOPARIB TOSYLATE 0.75 MG PO CAPS
0.7500 mg | ORAL_CAPSULE | Freq: Every day | ORAL | 4 refills | Status: DC
Start: 2024-02-16 — End: 2024-03-06

## 2024-02-16 NOTE — Addendum Note (Signed)
 Addended by: Gray Layman R on: 02/16/2024 02:16 PM   Modules accepted: Orders

## 2024-02-16 NOTE — Progress Notes (Signed)
 This is Hematology and Oncology Follow Up Visit  Ariana Herrera [id number] 02-22-1966 58 y.o. 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 daily-start on 01/18/2024 Talazoparib 1 mg p.o. daily-start on 12/22/2023 -  changed to 0.75 mg po q day on 02/16/2024   Interim History:  [name] is here today for follow-up.  She seems to be doing pretty well.  She is stop smoking now.  She is smoking about 4 days ago.  I am not very impressed with this.  Unfortunately, her blood counts really have taken a little bit of a dip with the Talazoparib.  As such, we will have her off the Talazoparib for about a week.  Will have to decrease the dose to 0.75 mg a day.  She has had problems with her right hip.  We did do x-rays of the right hip when she was last year.  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  Her last CA 27.29 was 35.1.  This is holding steady.  She has had no change in bowel or bladder habits.  She has had a little bit of constipation.  She has had no bleeding.  She has had no fever.  Overall, I would have to say that her performance status is probably ECOG 1.  .   Medications:  Allergies as of 02/16/2024       Reactions   Lithium Other (See Comments)   Spinal fluid built up in brain   Dulaglutide  Nausea And Vomiting, Other (See Comments)   TRULICITY    Nitrofuran Derivatives Itching   Nitrofurantoin  Mono- MCR   Penicillins Other (See Comments)   UNKNOWN CHILDHOOD  REACTION        Medication List        Accurate as of February 16, 2024 12:57 PM. If you have any questions, ask your nurse or doctor.          Accu-Chek Guide test strip Generic drug: glucose blood 3 (three) times daily.   albuterol  1.25 MG/3ML nebulizer solution Commonly known as: ACCUNEB  Take 1 ampule by nebulization every 6 (six) hours as needed for wheezing or shortness of breath.   albuterol  108 (90 Base) MCG/ACT inhaler Commonly known as: VENTOLIN  HFA Inhale 2 puffs into the lungs every 6 (six) hours as needed for wheezing or shortness of breath.   cyclobenzaprine  5 MG tablet Commonly known as: FLEXERIL  Take 5 mg by mouth 2 (two) times daily as needed for muscle spasms.   fenofibrate  145 MG tablet Commonly known as: TRICOR  Take 145 mg by mouth daily.   FreeStyle [employer] 3 Sensor Misc Apply 1 sensor every 14 (fourteen) days.   gabapentin  300 MG capsule Commonly known as: NEURONTIN  Take 1 capsule (300 mg total) by mouth 3 (three) times daily.   HYDROcodone -acetaminophen  7.5-325 MG tablet Commonly known as: NORCO Take 1 tablet by mouth every 6 (six) hours as needed for moderate pain (pain score 4-6).   hydrOXYzine 25 MG tablet Commonly  known as: ATARAX Take 25 mg by mouth 4 (four) times daily.   lidocaine -prilocaine  cream Commonly known as: EMLA  APPLY TOPICALLY 1 APPLICATION AS NEEDED   linaclotide  290 MCG Caps capsule Commonly known as: Linzess  Take 1 capsule (290 mcg total) by mouth daily before breakfast.   metFORMIN  1000 MG tablet Commonly known as: GLUCOPHAGE  Take 1,000 mg by mouth 2 (two) times daily with a meal.   metoprolol  tartrate 25 MG tablet Commonly known as: LOPRESSOR  Take 1 tablet (25 mg total) by mouth 2 (two) times daily.   nitroGLYCERIN  0.4 MG SL tablet Commonly known as: NITROSTAT  Place 1 tablet (0.4 mg total) under the tongue every 5 (five) minutes as needed for chest pain.   NovoLOG  FlexPen 100 UNIT/ML FlexPen Generic drug:  insulin  aspart Inject 3 Units into the skin 3 (three) times daily with meals. What changed:  how much to take when to take this additional instructions   Omnipod 5 DexG7G6 Intro Gen 5 Kit To start 02/06/24.   ondansetron  4 MG disintegrating tablet Commonly known as: ZOFRAN -ODT Take 1 tablet (4 mg total) by mouth every 8 (eight) hours as needed.   ondansetron  8 MG tablet Commonly known as: ZOFRAN  Take 1 tablet (8 mg total) by mouth every 8 (eight) hours as needed for nausea or vomiting. START ON THE THIRD DAY AFTER CHEMOTHERAPY.   oxybutynin  5 MG 24 hr tablet Commonly known as: DITROPAN -XL Take 1 tablet (5 mg total) by mouth at bedtime.   pantoprazole  40 MG tablet Commonly known as: PROTONIX  TAKE 1 TABLET BY MOUTH EVERY DAY   PEG 3350  17 g Pack Take 17 g by mouth daily as needed.   prochlorperazine  10 MG tablet Commonly known as: COMPAZINE  Take 1 tablet (10 mg total) by mouth every 6 (six) hours as needed for nausea or vomiting (Zofran  resumes 12/16).   QUEtiapine  400 MG tablet Commonly known as: SEROQUEL  TAKE 2 TABLETS (800 MG TOTAL) BY MOUTH AT BEDTIME.   rosuvastatin  40 MG tablet Commonly known as: CRESTOR  Take 1 tablet (40 mg total) by mouth daily.   senna 8.6 MG Tabs tablet Commonly known as: SENOKOT Take 1 tablet (8.6 mg total) by mouth 2 (two) times daily.   talazoparib tosylate  1 MG capsule Commonly known as: TALZENNA  Take 1 capsule (1 mg total) by mouth daily.   Tresiba FlexTouch 200 UNIT/ML FlexTouch Pen Generic drug: insulin  degludec Inject 26 Units into the skin at bedtime.   Xarelto  20 MG Tabs tablet Generic drug: rivaroxaban  Take 1 tablet (20 mg total) by mouth daily with supper.        Allergies:  Allergies  Allergen Reactions   Lithium Other (See Comments)    Spinal fluid built up in brain   Dulaglutide  Nausea And Vomiting and Other (See Comments)    TRULICITY    Nitrofuran Derivatives Itching    Nitrofurantoin  Mono- MCR   Penicillins  Other (See Comments)    UNKNOWN CHILDHOOD REACTION    Past Medical History, Surgical history, Social history, and Family History were reviewed and updated.  Review of Systems: Review of Systems  Constitutional:  Positive for malaise/fatigue.  HENT: Negative.    Eyes: Negative.   Respiratory: Negative.    Cardiovascular:  Positive for leg swelling.  Gastrointestinal:  Positive for nausea.  Genitourinary: Negative.   Musculoskeletal:  Positive for joint pain and myalgias.  Skin: Negative.   Neurological:  Positive for tingling.  Endo/Heme/Allergies: Negative.   Psychiatric/Behavioral: Negative.       Physical Exam:  height is 5\' 2"  (1.575 m) and weight is 165 lb (74.8 kg). Her oral temperature is 97.8 F (36.6 C). Her blood pressure is 98/59 (abnormal) and her pulse is 59 (abnormal). Her respiration is 18 and oxygen saturation is 100%.   Wt Readings from Last 3 Encounters:  02/16/24 165 lb (74.8 kg)  02/06/24 162 lb (73.5 kg)  01/30/24 168 lb 12.8 oz (76.6 kg)    Physical Exam Vitals reviewed.  Constitutional:      Comments: On her breast exam, the right breast is without masses, edema or erythema.  There is no right axillary adenopathy.  Her left breast is somewhat contracted from radiation and surgery.  It is somewhat firm.  It is somewhat swollen although still contracted.  It is tender.  I cannot palpate any adenopathy in the left axilla.  There is no left nipple discharge.  HENT:     Head: Normocephalic and atraumatic.     Comments: She does not have any swelling now.  I do not feel any firmness on the left side of her neck.  No adenopathy is noted.  She has no intraoral lesions.  Eyes:     Pupils: Pupils are equal, round, and reactive to light.  Cardiovascular:     Rate and Rhythm: Normal rate and regular rhythm.     Heart sounds: Normal heart sounds.  Pulmonary:     Effort: Pulmonary effort is normal.     Breath sounds: Normal breath sounds.  Abdominal:      General: Bowel sounds are normal.     Palpations: Abdomen is soft.  Musculoskeletal:        General: No tenderness or deformity. Normal range of motion.     Cervical back: Normal range of motion.  Lymphadenopathy:     Cervical: No cervical adenopathy.  Skin:    General: Skin is warm and dry.     Findings: No erythema or rash.  Neurological:     Mental Status: She is alert and oriented to person, place, and time.  Psychiatric:        Behavior: Behavior normal.        Thought Content: Thought content normal.        Judgment: Judgment normal.    Lab Results  Component Value Date   WBC 5.5 02/06/2024   HGB 11.4 (L) 02/06/2024   HCT 32.7 (L) 02/06/2024   MCV 95.6 02/06/2024   PLT 180 02/06/2024   Lab Results  Component Value Date   FERRITIN 33 08/25/2023   IRON 61 08/25/2023   TIBC 400 08/25/2023   UIBC 339 08/25/2023   IRONPCTSAT 15 08/25/2023   Lab Results  Component Value Date   RETICCTPCT 4.0 (H) 08/05/2022   RBC 3.42 (L) 02/06/2024   No results found for: "KPAFRELGTCHN", "LAMBDASER", "KAPLAMBRATIO" No results found for: "IGGSERUM", "IGA", "IGMSERUM" No results found for: "TOTALPROTELP", "ALBUMINELP", "A1GS", "A2GS", "BETS", "BETA2SER", "GAMS", "MSPIKE", "SPEI"   Chemistry      Component Value Date/Time   NA 139 02/06/2024 1521   K 3.6 02/06/2024 1521   CL 104 02/06/2024 1521   CO2 23 02/06/2024 1521   BUN 20 02/06/2024 1521   CREATININE 1.20 (H) 02/06/2024 1521      Component Value Date/Time   CALCIUM  9.5 02/06/2024 1521   ALKPHOS 97 02/06/2024 1521   AST 16 02/06/2024 1521   ALT 13 02/06/2024 1521   BILITOT 0.4 02/06/2024 1521       Impression and Plan: Ms. Mersman  is a very pleasant  58 yo caucasian female with recurrent ductal carcinoma of the left breast.  It is amazing that the recurrence was just been in the neck.  For right now, we will have to make a change in her Talazoparib dose.  She will stop the Talazoparib that she is taking right now.  She  will hold off for a week and then restart at the lower dose.  We will have to get her back to see us  probably about 3 weeks so that we can see how her blood counts are doing.  At some point, probably in June, we probably will have to get some scans on her and see how everything looks.    Ivor Mars, MD 4/24/202512:57 PM

## 2024-02-17 ENCOUNTER — Other Ambulatory Visit: Payer: Self-pay

## 2024-02-19 ENCOUNTER — Other Ambulatory Visit: Payer: Self-pay

## 2024-02-22 ENCOUNTER — Other Ambulatory Visit (HOSPITAL_BASED_OUTPATIENT_CLINIC_OR_DEPARTMENT_OTHER): Payer: Self-pay

## 2024-02-23 ENCOUNTER — Other Ambulatory Visit: Payer: Self-pay

## 2024-02-24 ENCOUNTER — Telehealth: Payer: Self-pay | Admitting: Family Medicine

## 2024-02-24 ENCOUNTER — Ambulatory Visit (INDEPENDENT_AMBULATORY_CARE_PROVIDER_SITE_OTHER): Admitting: Physician Assistant

## 2024-02-24 ENCOUNTER — Encounter: Payer: Self-pay | Admitting: Physician Assistant

## 2024-02-24 VITALS — BP 92/58 | HR 105 | Temp 99.3°F | Resp 20 | Ht 62.0 in | Wt 161.6 lb

## 2024-02-24 DIAGNOSIS — D696 Thrombocytopenia, unspecified: Secondary | ICD-10-CM | POA: Diagnosis not present

## 2024-02-24 DIAGNOSIS — I959 Hypotension, unspecified: Secondary | ICD-10-CM

## 2024-02-24 DIAGNOSIS — R300 Dysuria: Secondary | ICD-10-CM

## 2024-02-24 DIAGNOSIS — N3000 Acute cystitis without hematuria: Secondary | ICD-10-CM | POA: Diagnosis not present

## 2024-02-24 DIAGNOSIS — R233 Spontaneous ecchymoses: Secondary | ICD-10-CM

## 2024-02-24 LAB — POC URINALSYSI DIPSTICK (AUTOMATED)
Bilirubin, UA: POSITIVE
Blood, UA: NEGATIVE
Glucose, UA: NEGATIVE
Ketones, UA: NEGATIVE
Nitrite, UA: NEGATIVE
Protein, UA: POSITIVE — AB
Spec Grav, UA: 1.02 (ref 1.010–1.025)
Urobilinogen, UA: 0.2 U/dL
pH, UA: 5 (ref 5.0–8.0)

## 2024-02-24 LAB — CBC WITH DIFFERENTIAL/PLATELET
Absolute Lymphocytes: 639 {cells}/uL — ABNORMAL LOW (ref 850–3900)
Absolute Monocytes: 326 {cells}/uL (ref 200–950)
Basophils Absolute: 9 {cells}/uL (ref 0–200)
Basophils Relative: 0.3 %
Eosinophils Absolute: 102 {cells}/uL (ref 15–500)
Eosinophils Relative: 3.3 %
HCT: 26 % — ABNORMAL LOW (ref 35.0–45.0)
Hemoglobin: 8.8 g/dL — ABNORMAL LOW (ref 11.7–15.5)
MCH: 33.8 pg — ABNORMAL HIGH (ref 27.0–33.0)
MCHC: 33.8 g/dL (ref 32.0–36.0)
MCV: 100 fL (ref 80.0–100.0)
MPV: 13.1 fL — ABNORMAL HIGH (ref 7.5–12.5)
Monocytes Relative: 10.5 %
Neutro Abs: 2024 {cells}/uL (ref 1500–7800)
Neutrophils Relative %: 65.3 %
Platelets: 16 10*3/uL — CL (ref 140–400)
RBC: 2.6 10*6/uL — ABNORMAL LOW (ref 3.80–5.10)
RDW: 16.4 % — ABNORMAL HIGH (ref 11.0–15.0)
Total Lymphocyte: 20.6 %
WBC: 3.1 10*3/uL — ABNORMAL LOW (ref 3.8–10.8)

## 2024-02-24 MED ORDER — CEPHALEXIN 500 MG PO CAPS: 500.0000 mg | ORAL_CAPSULE | Freq: Two times a day (BID) | ORAL | 0 refills | Status: DC

## 2024-02-24 NOTE — Progress Notes (Signed)
 Established patient visit   Patient: Ariana Herrera   DOB: 1966/05/08   58 y.o. Female  MRN: [id number] Visit Date: 02/24/2024  Today's healthcare provider: Trenton Frock, PA-C   Chief Complaint  Patient presents with   Dysuria    Sxs started x1 week, pt states having burning and freq. Pt states she thought she had a yeast infection and treated with monistat for 1 week. Pt states having dizziness; started 2 days ago    Subjective     Pt reports dysuria, lower abdominal pain ,dizziness, fatigue x 1 week. Reports new rash on her legs x 1 days.   Medications: Outpatient Medications Prior to Visit  Medication Sig   ACCU-CHEK GUIDE test strip 3 (three) times daily.   albuterol  (ACCUNEB ) 1.25 MG/3ML nebulizer solution Take 1 ampule by nebulization every 6 (six) hours as needed for wheezing or shortness of breath.   albuterol  (VENTOLIN  HFA) 108 (90 Base) MCG/ACT inhaler Inhale 2 puffs into the lungs every 6 (six) hours as needed for wheezing or shortness of breath.   cyclobenzaprine  (FLEXERIL ) 5 MG tablet Take 5 mg by mouth 2 (two) times daily as needed for muscle spasms.   fenofibrate  (TRICOR ) 145 MG tablet Take 145 mg by mouth daily.   HYDROcodone -acetaminophen  (NORCO) 7.5-325 MG tablet Take 1 tablet by mouth every 6 (six) hours as needed for moderate pain (pain score 4-6).   hydrOXYzine (ATARAX) 25 MG tablet Take 25 mg by mouth 4 (four) times daily.   insulin  aspart (NOVOLOG  FLEXPEN) 100 UNIT/ML FlexPen Inject 3 Units into the skin 3 (three) times daily with meals. (Patient taking differently: Inject 4-14 Units into the skin See admin instructions. Depending on BGL, inject 4-10 units into the skin after breakfast, 6-12 units after lunch, and 8-14 units after supper)   Insulin  Disposable Pump (OMNIPOD 5 DEXG7G6 INTRO GEN 5) KIT To start 02/06/24.   lidocaine -prilocaine  (EMLA ) cream APPLY TOPICALLY 1 APPLICATION AS NEEDED   linaclotide  (LINZESS ) 290 MCG CAPS capsule Take 1 capsule  (290 mcg total) by mouth daily before breakfast.   metFORMIN  (GLUCOPHAGE ) 1000 MG tablet Take 1,000 mg by mouth 2 (two) times daily with a meal.   OLANZapine  (ZYPREXA ) 10 MG tablet Take 1 tablet (10 mg total) by mouth at bedtime.   ondansetron  (ZOFRAN ) 8 MG tablet Take 1 tablet (8 mg total) by mouth every 8 (eight) hours as needed for nausea or vomiting. START ON THE THIRD DAY AFTER CHEMOTHERAPY.   ondansetron  (ZOFRAN -ODT) 4 MG disintegrating tablet Take 1 tablet (4 mg total) by mouth every 8 (eight) hours as needed.   oxybutynin  (DITROPAN -XL) 5 MG 24 hr tablet Take 1 tablet (5 mg total) by mouth at bedtime.   pantoprazole  (PROTONIX ) 40 MG tablet TAKE 1 TABLET BY MOUTH EVERY DAY   prochlorperazine  (COMPAZINE ) 10 MG tablet Take 1 tablet (10 mg total) by mouth every 6 (six) hours as needed for nausea or vomiting (Zofran  resumes 12/16).   QUEtiapine  (SEROQUEL ) 400 MG tablet TAKE 2 TABLETS (800 MG TOTAL) BY MOUTH AT BEDTIME.   rivaroxaban  (XARELTO ) 20 MG TABS tablet Take 1 tablet (20 mg total) by mouth daily with supper.   senna (SENOKOT) 8.6 MG TABS tablet Take 1 tablet (8.6 mg total) by mouth 2 (two) times daily.   talazoparib tosylate  (TALZENNA ) 0.75 MG capsule Take 1 capsule (0.75 mg total) by mouth daily.   TRESIBA FLEXTOUCH 200 UNIT/ML FlexTouch Pen Inject 26 Units into the skin at bedtime.  gabapentin  (NEURONTIN ) 300 MG capsule Take 1 capsule (300 mg total) by mouth 3 (three) times daily. (Patient not taking: Reported on 02/24/2024)   metoprolol  tartrate (LOPRESSOR ) 25 MG tablet Take 1 tablet (25 mg total) by mouth 2 (two) times daily.   nitroGLYCERIN  (NITROSTAT ) 0.4 MG SL tablet Place 1 tablet (0.4 mg total) under the tongue every 5 (five) minutes as needed for chest pain. (Patient not taking: Reported on 01/18/2024)   polyethylene glycol (MIRALAX  / GLYCOLAX ) 17 g packet Take 17 g by mouth daily as needed. (Patient not taking: Reported on 02/24/2024)   rosuvastatin  (CRESTOR ) 40 MG tablet Take 1  tablet (40 mg total) by mouth daily.   Facility-Administered Medications Prior to Visit  Medication Dose Route Frequency Provider   sodium chloride  flush (NS) 0.9 % injection 10 mL  10 mL Intravenous PRN Ivor Mars, MD    Review of Systems  Constitutional:  Positive for fatigue. Negative for fever.  Respiratory:  Negative for cough and shortness of breath.   Cardiovascular:  Negative for chest pain and leg swelling.  Gastrointestinal:  Negative for abdominal pain.  Genitourinary:  Positive for dysuria and frequency.  Skin:  Positive for rash.  Neurological:  Positive for dizziness. Negative for headaches.       Objective    BP (!) 92/58 (BP Location: Right Arm, Patient Position: Sitting, Cuff Size: Normal)   Pulse (!) 105   Temp 99.3 F (37.4 C) (Oral)   Resp 20   Ht 5\' 2"  (1.575 m)   Wt 161 lb 9.6 oz (73.3 kg)   LMP 10/04/2016 Comment: after "procedure to stop bleeding"  SpO2 97%   BMI 29.56 kg/m    Physical Exam Constitutional:      General: She is awake.     Appearance: She is well-developed.  HENT:     Head: Normocephalic.  Eyes:     Conjunctiva/sclera: Conjunctivae normal.  Cardiovascular:     Rate and Rhythm: Regular rhythm. Tachycardia present.     Heart sounds: Normal heart sounds.  Pulmonary:     Effort: Pulmonary effort is normal.     Breath sounds: Normal breath sounds.  Skin:    General: Skin is warm.     Comments: Petechiae b/l LE up to mid-shin  Neurological:     Mental Status: She is alert and oriented to person, place, and time.  Psychiatric:        Attention and Perception: Attention normal.        Mood and Affect: Mood normal.        Speech: Speech normal.        Behavior: Behavior is cooperative.     Results for orders placed or performed in visit on 02/24/24  POCT Urinalysis Dipstick (Automated)  Result Value Ref Range   Color, UA yellow    Clarity, UA cloudy    Glucose, UA Negative Negative   Bilirubin, UA Positive     Ketones, UA negative    Spec Grav, UA 1.020 1.010 - 1.025   Blood, UA negative    pH, UA 5.0 5.0 - 8.0   Protein, UA Positive (A) Negative   Urobilinogen, UA 0.2 0.2 or 1.0 E.U./dL   Nitrite, UA negative    Leukocytes, UA Large (3+) (A) Negative    Assessment & Plan    Acute cystitis without hematuria -     Cephalexin ; Take 1 capsule (500 mg total) by mouth 2 (two) times daily.  Dispense: 10 capsule; Refill:  0  Thrombocytopenia (HCC)  Petechiae -     CBC with Differential/Platelet  Dysuria -     POCT Urinalysis Dipstick (Automated) -     Urine Culture  Hypotension, unspecified hypotension type   Stressed hydration/electrolytes to pt, if dizziness worsens, any syncope, to go to the ED for tx.   Labs drawn last week -- plt count of 39 likely cause of petechiae, but as this is new x 1 days, will repeat cbc to see if she needs f/u with onc earlier.  Rx keflex  for UTI, culture ordered.   Return if symptoms worsen or fail to improve.       Trenton Frock, PA-C  [facility] [phone] (phone) [phone] (fax)  [facility]

## 2024-02-24 NOTE — Telephone Encounter (Signed)
 Received an after-hours call about a patient with a critically low platelet count of 16,000/L. The patient, who has a history of breast cancer and is on Xarelto , was seen earlier by her primary care provider. She was tachycardic, hypotensive, and presented with UTI symptoms, for which she was started on cephalexin . A urinalysis showed large 3+ glucosides and negative nitrates, and she had multiple petechiae on her legs. Her platelet count dropped significantly from 39,000/L a week ago, and she also experienced dizziness and a worsening rash. Given her condition, she is at high risk for severe bleeding and rapid clinical deterioration. She was informed of these risks and agreed to go immediately to the emergency department for further evaluation and management.

## 2024-02-25 ENCOUNTER — Encounter (HOSPITAL_COMMUNITY): Payer: Self-pay | Admitting: Emergency Medicine

## 2024-02-25 ENCOUNTER — Emergency Department (HOSPITAL_COMMUNITY)
Admission: EM | Admit: 2024-02-25 | Discharge: 2024-02-25 | Disposition: A | Discharge status: Home / Self Care | Attending: Emergency Medicine | Admitting: Emergency Medicine

## 2024-02-25 ENCOUNTER — Other Ambulatory Visit: Payer: Self-pay

## 2024-02-25 DIAGNOSIS — Z7901 Long term (current) use of anticoagulants: Secondary | ICD-10-CM | POA: Diagnosis not present

## 2024-02-25 DIAGNOSIS — R42 Dizziness and giddiness: Secondary | ICD-10-CM | POA: Diagnosis present

## 2024-02-25 DIAGNOSIS — D696 Thrombocytopenia, unspecified: Secondary | ICD-10-CM | POA: Insufficient documentation

## 2024-02-25 DIAGNOSIS — Z794 Long term (current) use of insulin: Secondary | ICD-10-CM | POA: Diagnosis not present

## 2024-02-25 LAB — TYPE AND SCREEN
ABO/RH(D): A POS
Antibody Screen: NEGATIVE

## 2024-02-25 LAB — COMPREHENSIVE METABOLIC PANEL WITH GFR
ALT: 12 U/L (ref 0–44)
AST: 14 U/L — ABNORMAL LOW (ref 15–41)
Albumin: 3.1 g/dL — ABNORMAL LOW (ref 3.5–5.0)
Alkaline Phosphatase: 79 U/L (ref 38–126)
Anion gap: 8 (ref 5–15)
BUN: 28 mg/dL — ABNORMAL HIGH (ref 6–20)
CO2: 23 mmol/L (ref 22–32)
Calcium: 8.7 mg/dL — ABNORMAL LOW (ref 8.9–10.3)
Chloride: 108 mmol/L (ref 98–111)
Creatinine, Ser: 1.17 mg/dL — ABNORMAL HIGH (ref 0.44–1.00)
GFR, Estimated: 55 mL/min — ABNORMAL LOW (ref >60)
Glucose, Bld: 172 mg/dL — ABNORMAL HIGH (ref 70–99)
Potassium: 3.9 mmol/L (ref 3.5–5.1)
Sodium: 139 mmol/L (ref 135–145)
Total Bilirubin: 0.4 mg/dL (ref 0.0–1.2)
Total Protein: 6.2 g/dL — ABNORMAL LOW (ref 6.5–8.1)

## 2024-02-25 LAB — LACTIC ACID, PLASMA
Lactic Acid, Venous: 2 mmol/L (ref 0.5–1.9)
Lactic Acid, Venous: 2.1 mmol/L (ref 0.5–1.9)

## 2024-02-25 NOTE — Discharge Instructions (Signed)
 Your history, exam, and evaluation are consistent with low platelets or thrombocytopenia like related to the chemotherapy as well as your UTI.  I spoke with oncology who requested we have you stop the Xarelto  as your platelets are still low although they did not feel you need transfusion or admission at this time.  Your platelets were up trending up to 22 from 16 yesterday.  You had no signs of acute bleeding.  Please rest and stay hydrated and hold the Xarelto .  Please call to follow-up with them in clinic on Monday for likely platelets reevaluation.  Given your lack of other symptoms, we will discharge home.  Please take the antibiotics you were ordered for the urinary tract infection.  We agreed together to hold on making you wait for repeat urinalysis today.  If any symptoms change or worsen acutely, please return to the nearest emergency department.

## 2024-02-25 NOTE — ED Provider Notes (Signed)
 [place] EMERGENCY DEPARTMENT AT [facility] Provider Note   CSN: [id number] Arrival date & time: 02/25/24  1526     History  Chief Complaint  Patient presents with   Abnormal Lab    SHAHIDAH HANIF is a 58 y.o. female.  The history is provided by the patient and medical records.  Abnormal Lab Time since result:  1 day Patient referred by:  Specialist Resulting agency:  Internal Result type: hematology   Hematology:    Platelets:  Low      Home Medications Prior to Admission medications   Medication Sig Start Date End Date Taking? Authorizing Provider  ACCU-CHEK GUIDE test strip 3 (three) times daily. 07/17/21   [provider]  albuterol  (ACCUNEB ) 1.25 MG/3ML nebulizer solution Take 1 ampule by nebulization every 6 (six) hours as needed for wheezing or shortness of breath. 02/27/23   [provider]  albuterol  (VENTOLIN  HFA) 108 (90 Base) MCG/ACT inhaler Inhale 2 puffs into the lungs every 6 (six) hours as needed for wheezing or shortness of breath. 06/23/23   Drubel, Heidi Llamas, PA-C  cephALEXin  (KEFLEX ) 500 MG capsule Take 1 capsule (500 mg total) by mouth 2 (two) times daily. 02/24/24   Trenton Frock, PA-C  cyclobenzaprine  (FLEXERIL ) 5 MG tablet Take 5 mg by mouth 2 (two) times daily as needed for muscle spasms.    [provider]  fenofibrate  (TRICOR ) 145 MG tablet Take 145 mg by mouth daily. 11/22/21   [provider]  gabapentin  (NEURONTIN ) 300 MG capsule Take 1 capsule (300 mg total) by mouth 3 (three) times daily. Patient not taking: Reported on 02/24/2024 10/13/23   Ozell Blunt, MD  HYDROcodone -acetaminophen  (NORCO) 7.5-325 MG tablet Take 1 tablet by mouth every 6 (six) hours as needed for moderate pain (pain score 4-6). 02/16/24   Ivor Mars, MD  hydrOXYzine (ATARAX) 25 MG tablet Take 25 mg by mouth 4 (four) times daily. 01/07/24   [provider]  insulin  aspart (NOVOLOG  FLEXPEN) 100 UNIT/ML FlexPen Inject 3  Units into the skin 3 (three) times daily with meals. Patient taking differently: Inject 4-14 Units into the skin See admin instructions. Depending on BGL, inject 4-10 units into the skin after breakfast, 6-12 units after lunch, and 8-14 units after supper 08/25/20   Rizwan, Saima, MD  Insulin  Disposable Pump (OMNIPOD 5 DEXG7G6 INTRO GEN 5) KIT To start 02/06/24. 12/14/23   [provider]  lidocaine -prilocaine  (EMLA ) cream APPLY TOPICALLY 1 APPLICATION AS NEEDED 10/31/23   Ivor Mars, MD  linaclotide  (LINZESS ) 290 MCG CAPS capsule Take 1 capsule (290 mcg total) by mouth daily before breakfast. 12/05/23   Daina Drum, MD  metFORMIN  (GLUCOPHAGE ) 1000 MG tablet Take 1,000 mg by mouth 2 (two) times daily with a meal. 11/26/19   [provider]  metoprolol  tartrate (LOPRESSOR ) 25 MG tablet Take 1 tablet (25 mg total) by mouth 2 (two) times daily. 09/20/23 12/19/23  Debria Fang, PA-C  nitroGLYCERIN  (NITROSTAT ) 0.4 MG SL tablet Place 1 tablet (0.4 mg total) under the tongue every 5 (five) minutes as needed for chest pain. Patient not taking: Reported on 01/18/2024 09/04/20   Carie Charity, NP  OLANZapine  (ZYPREXA ) 10 MG tablet Take 1 tablet (10 mg total) by mouth at bedtime. 02/16/24   Ivor Mars, MD  ondansetron  (ZOFRAN ) 8 MG tablet Take 1 tablet (8 mg total) by mouth every 8 (eight) hours as needed for nausea or vomiting. START ON THE THIRD DAY  AFTER CHEMOTHERAPY. 12/05/23   Daina Drum, MD  ondansetron  (ZOFRAN -ODT) 4 MG disintegrating tablet Take 1 tablet (4 mg total) by mouth every 8 (eight) hours as needed. 01/25/24   Trenton Frock, PA-C  oxybutynin  (DITROPAN -XL) 5 MG 24 hr tablet Take 1 tablet (5 mg total) by mouth at bedtime. 06/23/23   Trenton Frock, PA-C  pantoprazole  (PROTONIX ) 40 MG tablet TAKE 1 TABLET BY MOUTH EVERY DAY 12/30/23   Dorsey, Ying C, MD  polyethylene glycol (MIRALAX  / GLYCOLAX ) 17 g packet Take 17 g by mouth daily as needed. Patient not taking:  Reported on 02/24/2024 10/12/23   Ozell Blunt, MD  prochlorperazine  (COMPAZINE ) 10 MG tablet Take 1 tablet (10 mg total) by mouth every 6 (six) hours as needed for nausea or vomiting (Zofran  resumes 12/16). 10/12/23   Ozell Blunt, MD  QUEtiapine  (SEROQUEL ) 400 MG tablet TAKE 2 TABLETS (800 MG TOTAL) BY MOUTH AT BEDTIME. 11/22/23   Drubel, Heidi Llamas, PA-C  rivaroxaban  (XARELTO ) 20 MG TABS tablet Take 1 tablet (20 mg total) by mouth daily with supper. 01/18/24   Ivor Mars, MD  rosuvastatin  (CRESTOR ) 40 MG tablet Take 1 tablet (40 mg total) by mouth daily. 09/29/23 12/28/23  Debria Fang, PA-C  senna (SENOKOT) 8.6 MG TABS tablet Take 1 tablet (8.6 mg total) by mouth 2 (two) times daily. 10/12/23   Ozell Blunt, MD  talazoparib tosylate  (TALZENNA ) 0.75 MG capsule Take 1 capsule (0.75 mg total) by mouth daily. 02/16/24   Ivor Mars, MD  TRESIBA FLEXTOUCH 200 UNIT/ML FlexTouch Pen Inject 26 Units into the skin at bedtime.    [provider]      Allergies    Lithium, Dulaglutide , Nitrofuran derivatives, and Penicillins    Review of Systems   Review of Systems  Constitutional:  Positive for chills and fatigue. Negative for fever.  HENT:  Negative for congestion.   Respiratory:  Positive for cough (chronic and unchanged) and shortness of breath (chronic and unchanged). Negative for chest tightness and wheezing.   Cardiovascular:  Negative for chest pain, palpitations and leg swelling.  Gastrointestinal:  Positive for constipation (chronic) and nausea. Negative for abdominal pain, diarrhea and vomiting.  Genitourinary:  Positive for dysuria (known UTI). Negative for flank pain.  Musculoskeletal:  Negative for back pain, neck pain and neck stiffness.  Skin:  Positive for rash. Negative for wound.  Neurological:  Positive for light-headedness. Negative for dizziness and headaches.  Psychiatric/Behavioral:  Negative for agitation and confusion.   All other systems reviewed and  are negative.   Physical Exam Updated Vital Signs BP (!) 105/58 (BP Location: Right Arm)   Pulse (!) 101   Temp 98.9 F (37.2 C) (Oral)   Resp 17   LMP 10/04/2016 Comment: after "procedure to stop bleeding"  SpO2 100%  Physical Exam Vitals and nursing note reviewed.  Constitutional:      General: She is not in acute distress.    Appearance: She is well-developed. She is not ill-appearing, toxic-appearing or diaphoretic.  HENT:     Head: Normocephalic and atraumatic.     Nose: No congestion or rhinorrhea.     Mouth/Throat:     Mouth: Mucous membranes are moist.     Pharynx: No oropharyngeal exudate or posterior oropharyngeal erythema.  Eyes:     Extraocular Movements: Extraocular movements intact.     Conjunctiva/sclera: Conjunctivae normal.     Pupils: Pupils are equal, round, and reactive to light.  Cardiovascular:  Rate and Rhythm: Normal rate and regular rhythm.     Heart sounds: No murmur heard. Pulmonary:     Effort: Pulmonary effort is normal. No respiratory distress.     Breath sounds: Normal breath sounds. No wheezing, rhonchi or rales.  Chest:     Chest wall: No tenderness.  Abdominal:     General: Abdomen is flat.     Palpations: Abdomen is soft.     Tenderness: There is no abdominal tenderness. There is no right CVA tenderness, left CVA tenderness, guarding or rebound.  Musculoskeletal:        General: No swelling or tenderness.     Cervical back: Neck supple. No tenderness.     Right lower leg: No edema.     Left lower leg: No edema.  Skin:    General: Skin is warm and dry.     Capillary Refill: Capillary refill takes less than 2 seconds.     Findings: Rash present.  Neurological:     General: No focal deficit present.     Mental Status: She is alert.     Sensory: No sensory deficit.     Motor: No weakness.  Psychiatric:        Mood and Affect: Mood normal.     ED Results / Procedures / Treatments   Labs (all labs ordered are listed, but only  abnormal results are displayed) Labs Reviewed  CBC WITH DIFFERENTIAL/PLATELET - Abnormal; Notable for the following components:      Result Value   WBC 3.0 (*)    RBC 2.58 (*)    Hemoglobin 8.5 (*)    HCT 25.4 (*)    RDW 16.0 (*)    Platelets 22 (*)    Neutro Abs 1.6 (*)    All other components within normal limits  COMPREHENSIVE METABOLIC PANEL WITH GFR - Abnormal; Notable for the following components:   Glucose, Bld 172 (*)    BUN 28 (*)    Creatinine, Ser 1.17 (*)    Calcium  8.7 (*)    Total Protein 6.2 (*)    Albumin 3.1 (*)    AST 14 (*)    GFR, Estimated 55 (*)    All other components within normal limits  LACTIC ACID, PLASMA - Abnormal; Notable for the following components:   Lactic Acid, Venous 2.0 (*)    All other components within normal limits  LACTIC ACID, PLASMA  TYPE AND SCREEN    EKG None  Radiology No results found.  Procedures Procedures    Medications Ordered in ED Medications - No data to display  ED Course/ Medical Decision Making/ A&P                                 Medical Decision Making Amount and/or Complexity of Data Reviewed Labs: ordered.    MILO SIEGLE is a 58 y.o. female with past medical history significant for hyperlipidemia, breast cancer, previous DVT, diabetes, previous pancreatitis, and recent diagnosis of urinary tract infection who presents for further evaluation of thrombocytopenia, rash, fatigue, lightheadedness.  According to patient, she was diagnosed with urinary tract infection yesterday and had antibiotics ordered.  She has not yet started them but says she was called today and told that her platelets have been downtrending and she is to come to the hospital.  She says that she has been more fatigued and tired but has not had any actual  syncopal episodes.  She reports chronic cough and chronic shortness of breath is not a different.  She denies any chest pain or palpitations.  She denies any nausea, vomiting or  diarrhea.  Reports chronic constipation is not different.  Denies abdominal pain or flank or back pain.  She reports some rash on her legs that has been ascending and slightly on her arms.  Itching and was suspected to be petechiae yesterday related to the thrombocytopenia.  She reports her last chemotherapy treatment was about 2 weeks ago.  She reports she is still having dysuria with a urinary tract infection.  Otherwise she denies bleeding from her gums and is denying any headache.  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  Denies any tick exposures.  On exam, lungs clear.  Chest nontender.  Abdomen nontender.  Back nontender.  Slight tenderness in the paraspinal upper back towards her neck but no midline tenderness.  No focal neurologic deficits initially.  Symmetric smile.  Clear speech.  Pupils symmetric and reactive with normal extraocular's.  She has some rash on her bilateral lower legs and slightly on her arms.  Does look somewhat petechial and is nonblanching.  Does not have any rash or bleeding in her mouth.  The platelets per the chart yesterday were found to be 16 and downtrending.  Will get repeat labs and anticipate discussion with oncology about how they would like to proceed.     6:40 PM Patient is still resting.  Her platelets are 22 which is similar to the 16 it was yesterday.  Lactic acid is 2.0.  She does not have AKI and electrolytes otherwise similar to prior.  No leukocytosis.   Hemoglobin also slightly downtrending.  Urinalysis was ordered although she was diagnosed with UTI yesterday.  Will call oncology to see if they would like us  to transfuse platelets or how they would like to proceed.  6:59 PM Spoke to Dr. Marton Sleeper with oncology who reviewed the case.  With the platelets uptrending, lack of acute bleeding, patient's well appearance, she suspects it is related to the chemotherapy and likely urinary tract infection and felt that the patient does  not need admission or transfusion.  She feels as appropriate have patient stop the Xarelto  and call Dr. Birt Bulla clinic on Monday for close follow-up.  Will discuss with patient but anticipate discharge home.  7:14 PM Patient agrees, will cancel urinalysis and will discharge home.        Final Clinical Impression(s) / ED Diagnoses Final diagnoses:  Thrombocytopenia (HCC)    Rx / DC Orders ED Discharge Orders     None       Clinical Impression: 1. Thrombocytopenia (HCC)     Disposition: Discharge  Condition: Good  I have discussed the results, Dx and Tx plan with the pt(& family if present). He/she/they expressed understanding and agree(s) with the plan. Discharge instructions discussed at great length. Strict return precautions discussed and pt &/or family have verbalized understanding of the instructions. No further questions at time of discharge.    New Prescriptions   No medications on file    Follow Up: Dr. Reena Canning, PA-C [address] [place] [address] [phone]        Bobbi Yount, Marine Sia, MD 02/25/24 [phone]

## 2024-02-25 NOTE — ED Triage Notes (Signed)
 Patient presents due to low platelet count. She has been dizzy, nauseous and fatigued. Patient also found out she has a UTI yesterday, but has not started medication yet.

## 2024-02-26 LAB — URINE CULTURE
MICRO NUMBER:: 16406351
SPECIMEN QUALITY:: ADEQUATE

## 2024-02-27 ENCOUNTER — Telehealth: Payer: Self-pay

## 2024-02-27 ENCOUNTER — Encounter: Payer: Self-pay | Admitting: Hematology & Oncology

## 2024-02-27 NOTE — Telephone Encounter (Signed)
 Lab Name [facility] Phone Number [phone] Lab Tech Name [place] [facility] Reference Number [id number] D Chief Complaint PREGNANT (20 weeks or greater) - vaginal bleeding, water or leaking fluids Call Type Lab Send to RN Reason for Call Report lab results Initial Comment Caller states she is calling in critical labs. Translation No Disp. Time [name] Disposition Final User 02/24/2024 6:36:48 PM Send to Urgent Queue Levesque, Melissa 02/24/2024 6:52:51 PM Send to Urgent [name] 02/24/2024 7:02:17 PM Called On-Call Provider [name], RN, [name] 02/24/2024 7:02:50 PM Clinical Call Yes [name], RN, [name] Final Disposition 02/24/2024 7:02:50 PM Clinical Call Yes [name], RN, [name] Comments User: [name] Date/Time [name]: 02/24/2024 6:52:18 PM lab number check. [phone] User: [name], RN Date/Time [name]: 02/24/2024 6:59:39 PM RN received critical result from [facility] collected today 02/24/2024 at 4:15pm Platelet 16 verified by repeat analysis. Paging DoctorName Phone DateTime Result/ Outcome Message Type Notes Ariana Saint MD [phone] 02/24/2024 7:02:17 PM Called On Call Provider - Reached Doctor Paged PLEASE NOTE: All timestamps contained within this report are represented as [place] Standard Time. CONFIDENTIALTY NOTICE: This fax transmission is intended only for the addressee. It contains information that is legally privileged, confidential or otherwise protected from use or disclosure. If you are not the intended recipient, you are strictly prohibited from reviewing, disclosing, copying using or disseminating any of this information or taking any action in reliance on or regarding this information. If you have received this fax in error, please notify us  immediately by telephone so that we can arrange for its return to us . Phone: [phone], Toll-Free: [phone], Fax: [phone] DEBORAH_KNIGHT 09/14/1966 Page: 2 of  2 CallId: [id number] Paging DoctorName Phone DateTime Result/ Outcome Message Type Notes Ariana Herrera "Ariana Herrera"- MD 02/24/2024 7:02:36 PM Spoke with On Call - General Message Result Per Ariana Pain, MD he will call the patient

## 2024-02-28 ENCOUNTER — Ambulatory Visit: Admitting: Physician Assistant

## 2024-02-28 LAB — CBC WITH DIFFERENTIAL/PLATELET
Abs Immature Granulocytes: 0.01 10*3/uL (ref 0.00–0.07)
Basophils Absolute: 0 10*3/uL (ref 0.0–0.1)
Basophils Relative: 0 %
Eosinophils Absolute: 0.1 10*3/uL (ref 0.0–0.5)
Eosinophils Relative: 3 %
HCT: 25.4 % — ABNORMAL LOW (ref 36.0–46.0)
Hemoglobin: 8.5 g/dL — ABNORMAL LOW (ref 12.0–15.0)
Immature Granulocytes: 0 %
Lymphocytes Relative: 31 %
Lymphs Abs: 0.9 10*3/uL (ref 0.7–4.0)
MCH: 32.9 pg (ref 26.0–34.0)
MCHC: 33.5 g/dL (ref 30.0–36.0)
MCV: 98.4 fL (ref 80.0–100.0)
Monocytes Absolute: 0.3 10*3/uL (ref 0.1–1.0)
Monocytes Relative: 10 %
Neutro Abs: 1.6 10*3/uL — ABNORMAL LOW (ref 1.7–7.7)
Neutrophils Relative %: 56 %
Platelets: 22 10*3/uL — CL (ref 150–400)
RBC: 2.58 MIL/uL — ABNORMAL LOW (ref 3.87–5.11)
RDW: 16 % — ABNORMAL HIGH (ref 11.5–15.5)
WBC Morphology: >10
WBC: 3 10*3/uL — ABNORMAL LOW (ref 4.0–10.5)
nRBC: 0 % (ref 0.0–0.2)

## 2024-02-29 ENCOUNTER — Other Ambulatory Visit: Payer: Self-pay

## 2024-03-01 ENCOUNTER — Encounter: Payer: Self-pay | Admitting: Medical Oncology

## 2024-03-01 ENCOUNTER — Encounter: Payer: Self-pay | Admitting: *Deleted

## 2024-03-01 ENCOUNTER — Inpatient Hospital Stay: Attending: Hematology & Oncology

## 2024-03-01 ENCOUNTER — Other Ambulatory Visit: Payer: Self-pay | Admitting: *Deleted

## 2024-03-01 ENCOUNTER — Inpatient Hospital Stay (HOSPITAL_BASED_OUTPATIENT_CLINIC_OR_DEPARTMENT_OTHER): Admitting: Medical Oncology

## 2024-03-01 ENCOUNTER — Inpatient Hospital Stay

## 2024-03-01 ENCOUNTER — Telehealth: Payer: Self-pay | Admitting: *Deleted

## 2024-03-01 ENCOUNTER — Ambulatory Visit (HOSPITAL_BASED_OUTPATIENT_CLINIC_OR_DEPARTMENT_OTHER)
Admission: RE | Admit: 2024-03-01 | Discharge: 2024-03-01 | Disposition: A | Discharge status: Home / Self Care | Source: Ambulatory Visit | Attending: Medical Oncology | Admitting: Medical Oncology

## 2024-03-01 VITALS — BP 90/56 | HR 88 | Temp 98.6°F | Resp 18 | Ht 62.0 in | Wt 159.0 lb

## 2024-03-01 DIAGNOSIS — Z1732 Human epidermal growth factor receptor 2 negative status: Secondary | ICD-10-CM

## 2024-03-01 DIAGNOSIS — Z7901 Long term (current) use of anticoagulants: Secondary | ICD-10-CM | POA: Diagnosis not present

## 2024-03-01 DIAGNOSIS — C50011 Malignant neoplasm of nipple and areola, right female breast: Secondary | ICD-10-CM | POA: Diagnosis not present

## 2024-03-01 DIAGNOSIS — C7989 Secondary malignant neoplasm of other specified sites: Secondary | ICD-10-CM | POA: Diagnosis present

## 2024-03-01 DIAGNOSIS — W19XXXA Unspecified fall, initial encounter: Secondary | ICD-10-CM | POA: Insufficient documentation

## 2024-03-01 DIAGNOSIS — D696 Thrombocytopenia, unspecified: Secondary | ICD-10-CM | POA: Insufficient documentation

## 2024-03-01 DIAGNOSIS — R0781 Pleurodynia: Secondary | ICD-10-CM | POA: Insufficient documentation

## 2024-03-01 DIAGNOSIS — C50012 Malignant neoplasm of nipple and areola, left female breast: Secondary | ICD-10-CM

## 2024-03-01 DIAGNOSIS — Z1722 Progesterone receptor negative status: Secondary | ICD-10-CM | POA: Diagnosis not present

## 2024-03-01 DIAGNOSIS — Z87440 Personal history of urinary (tract) infections: Secondary | ICD-10-CM | POA: Diagnosis not present

## 2024-03-01 DIAGNOSIS — Z86718 Personal history of other venous thrombosis and embolism: Secondary | ICD-10-CM | POA: Insufficient documentation

## 2024-03-01 DIAGNOSIS — K5900 Constipation, unspecified: Secondary | ICD-10-CM | POA: Insufficient documentation

## 2024-03-01 DIAGNOSIS — R112 Nausea with vomiting, unspecified: Secondary | ICD-10-CM

## 2024-03-01 DIAGNOSIS — C770 Secondary and unspecified malignant neoplasm of lymph nodes of head, face and neck: Secondary | ICD-10-CM | POA: Diagnosis not present

## 2024-03-01 DIAGNOSIS — Y92009 Unspecified place in unspecified non-institutional (private) residence as the place of occurrence of the external cause: Secondary | ICD-10-CM | POA: Insufficient documentation

## 2024-03-01 DIAGNOSIS — Z171 Estrogen receptor negative status [ER-]: Secondary | ICD-10-CM | POA: Diagnosis not present

## 2024-03-01 DIAGNOSIS — G939 Disorder of brain, unspecified: Secondary | ICD-10-CM

## 2024-03-01 DIAGNOSIS — D649 Anemia, unspecified: Secondary | ICD-10-CM

## 2024-03-01 LAB — CMP (CANCER CENTER ONLY)
ALT: 12 U/L (ref 0–44)
AST: 16 U/L (ref 15–41)
Albumin: 3.7 g/dL (ref 3.5–5.0)
Alkaline Phosphatase: 84 U/L (ref 38–126)
Anion gap: 10 (ref 5–15)
BUN: 15 mg/dL (ref 6–20)
CO2: 22 mmol/L (ref 22–32)
Calcium: 9.1 mg/dL (ref 8.9–10.3)
Chloride: 102 mmol/L (ref 98–111)
Creatinine: 1.23 mg/dL — ABNORMAL HIGH (ref 0.44–1.00)
GFR, Estimated: 52 mL/min — ABNORMAL LOW (ref >60)
Glucose, Bld: 228 mg/dL — ABNORMAL HIGH (ref 70–99)
Potassium: 3.9 mmol/L (ref 3.5–5.1)
Sodium: 134 mmol/L — ABNORMAL LOW (ref 135–145)
Total Bilirubin: 0.4 mg/dL (ref 0.0–1.2)
Total Protein: 6.4 g/dL — ABNORMAL LOW (ref 6.5–8.1)

## 2024-03-01 LAB — CBC WITH DIFFERENTIAL (CANCER CENTER ONLY)
Abs Immature Granulocytes: 0.01 10*3/uL (ref 0.00–0.07)
Basophils Absolute: 0 10*3/uL (ref 0.0–0.1)
Basophils Relative: 0 %
Eosinophils Absolute: 0 10*3/uL (ref 0.0–0.5)
Eosinophils Relative: 1 %
HCT: 23.9 % — ABNORMAL LOW (ref 36.0–46.0)
Hemoglobin: 8.2 g/dL — ABNORMAL LOW (ref 12.0–15.0)
Immature Granulocytes: 0 %
Lymphocytes Relative: 26 %
Lymphs Abs: 0.8 10*3/uL (ref 0.7–4.0)
MCH: 33.6 pg (ref 26.0–34.0)
MCHC: 34.3 g/dL (ref 30.0–36.0)
MCV: 98 fL (ref 80.0–100.0)
Monocytes Absolute: 0.5 10*3/uL (ref 0.1–1.0)
Monocytes Relative: 16 %
Neutro Abs: 1.9 10*3/uL (ref 1.7–7.7)
Neutrophils Relative %: 57 %
Platelet Count: 93 10*3/uL — ABNORMAL LOW (ref 150–400)
RBC: 2.44 MIL/uL — ABNORMAL LOW (ref 3.87–5.11)
RDW: 17.1 % — ABNORMAL HIGH (ref 11.5–15.5)
Smear Review: NORMAL
WBC Count: 3.3 10*3/uL — ABNORMAL LOW (ref 4.0–10.5)
nRBC: 0 % (ref 0.0–0.2)

## 2024-03-01 MED ORDER — ONDANSETRON 4 MG PO TBDP: 4.0000 mg | ORAL_TABLET | Freq: Three times a day (TID) | ORAL | 1 refills | Status: DC

## 2024-03-01 MED ORDER — SODIUM CHLORIDE 0.9% FLUSH
10.0000 mL | INTRAVENOUS | Status: DC | PRN
  Administered 2024-03-01: 10 mL via INTRAVENOUS

## 2024-03-01 MED ORDER — HEPARIN SOD (PORK) LOCK FLUSH 100 UNIT/ML IV SOLN
500.0000 [IU] | Freq: Once | INTRAVENOUS | Status: DC
Start: 2024-03-01 — End: 2024-03-01
  Administered 2024-03-01: 500 [IU] via INTRAVENOUS

## 2024-03-01 NOTE — Progress Notes (Signed)
 This is Hematology and Oncology Follow Up Visit  Ariana Herrera [id number] [birth year] 58 y.o. 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 daily-start on 01/18/2024 Talazoparib 1 mg p.o. daily-start on 12/22/2023 -  changed to 0.75 mg po q day on 02/16/2024   Interim History:  Ariana Herrera is here today for follow-up for her thrombocytopenia which was thought to be secondary to her Talazoparib and acute UTI:  She has tolerated the Talazoparib well aside from some reduction in blood counts. This was held on 02/16/2024 for 1 week with plan to restart her on 0.75 mg a day. Unfortunately this never occurred as on 02/24/2024 she was seen by her PCP for a UTI and her platelet count was 16. She was referred to the hospital. She was seen in the hospital by on call Hem/Onc Dr. Marton Sleeper who advised closely monitoring of blood counts, continuing to hold her Talazoparib and to hold her Xeralto instead of platelet transfusion or admission.    Today she is here with her husband. They report that she is feeling ok overall. Her UTI symptoms have fully resolved. She denies any fevers or abdominal pains. She does report that she fell over a few items at home and fell onto her left ribcage. She is a bit sore from this in her ribcage area. No abdominal bruising, abdominal pain, SOB, hemoptysis. She denies hitting her head or having any LOC. She denies any new headaches but admit to some dizziness and visual  changes over the past few weeks (prior to the fall). This has been accompanies by some neck pain. She denies any extremity weakness, facial weakness or slurred speech. There has been no bleeding to her knowledge: denies epistaxis, gingivitis, hemoptysis, hematemesis, hematuria, melena, excessive bruising, blood donation.   Her last CA 27.29 on 02/06/2024 was 35.1.  This is holding steady.  She has had no change in bowel or bladder habits.  She has had a little bit of constipation.  She has had no fever.  Overall, I would have to say that her performance status is probably ECOG 1.  Wt Readings from Last 3 Encounters:  03/01/24 159 lb (72.1 kg)  02/24/24 161 lb 9.6 oz (73.3 kg)  02/16/24 165 lb (74.8 kg)    Medications:  Allergies as of 03/01/2024       Reactions   Lithium Other (See Comments)   Spinal fluid built up in brain   Dulaglutide  Nausea And Vomiting, Other (See Comments)   TRULICITY    Nitrofuran Derivatives Itching   Nitrofurantoin  Mono- MCR   Penicillins Other (See Comments)   UNKNOWN CHILDHOOD REACTION        Medication List        Accurate as of Mar 01, 2024  2:58 PM. If you have any questions, ask your nurse or doctor.          STOP taking these medications    cephALEXin  500 MG capsule Commonly known as: KEFLEX  Stopped by:  Alonza Arthurs [facility]   NovoLOG  FlexPen 100 UNIT/ML FlexPen Generic drug: insulin  aspart Stopped by: Alonza Arthurs Matt Song FlexTouch 200 UNIT/ML FlexTouch Pen Generic drug: insulin  degludec Stopped by: Sharla Davis       TAKE these medications    Accu-Chek Guide test strip Generic drug: glucose blood 3 (three) times daily.   albuterol  1.25 MG/3ML nebulizer solution Commonly known as: ACCUNEB  Take 1 ampule by nebulization every 6 (six) hours as needed for wheezing or shortness of breath.   albuterol  108 (90 Base) MCG/ACT inhaler Commonly known as: VENTOLIN  HFA Inhale 2 puffs into the lungs every 6 (six) hours as  needed for wheezing or shortness of breath.   cyclobenzaprine  5 MG tablet Commonly known as: FLEXERIL  Take 5 mg by mouth 2 (two) times daily as needed for muscle spasms.   Dexcom G7 Sensor Misc Change sensor every 10 days   fenofibrate  145 MG tablet Commonly known as: TRICOR  Take 145 mg by mouth daily.   gabapentin  300 MG capsule Commonly known as: NEURONTIN  Take 1 capsule (300 mg total) by mouth 3 (three) times daily.   HYDROcodone -acetaminophen  7.5-325 MG tablet Commonly known as: NORCO Take 1 tablet by mouth every 6 (six) hours as needed for moderate pain (pain score 4-6).   hydrOXYzine 25 MG tablet Commonly known as: ATARAX Take 25 mg by mouth 4 (four) times daily.   lidocaine -prilocaine  cream Commonly known as: EMLA  APPLY TOPICALLY 1 APPLICATION AS NEEDED   linaclotide  290 MCG Caps capsule Commonly known as: Linzess  Take 1 capsule (290 mcg total) by mouth daily before breakfast.   metFORMIN  1000 MG tablet Commonly known as: GLUCOPHAGE  Take 1,000 mg by mouth 2 (two) times daily with a meal.   metoprolol  tartrate 25 MG tablet Commonly known as: LOPRESSOR  Take 1 tablet (25 mg total) by mouth 2 (two) times daily.   nitroGLYCERIN  0.4 MG SL tablet Commonly known as: NITROSTAT  Place 1 tablet (0.4 mg total) under the tongue every 5 (five) minutes as needed for chest pain.   OLANZapine  10 MG tablet Commonly known as: ZYPREXA  Take 1 tablet (10 mg total) by mouth at bedtime.   Omnipod 5 DexG7G6 Pods Gen 5 Misc Apply one pod every 3 days to administer insulin  continuously. What changed: Another medication with the same name was removed. Continue taking this medication, and follow the directions you see here. Changed by: Sharla Davis   ondansetron  4 MG disintegrating tablet Commonly known as: ZOFRAN -ODT Take 1 tablet (4 mg total) by mouth every 8 (eight) hours. What changed:  when to take this reasons to take this Changed by: Nurse Ammon Bales C   ondansetron  8  MG tablet Commonly known as: ZOFRAN  Take 1 tablet (8 mg total) by mouth every 8 (eight) hours as needed for nausea or vomiting. START ON THE THIRD DAY AFTER CHEMOTHERAPY.   oxybutynin  5 MG 24 hr tablet Commonly known as: DITROPAN -XL Take 1 tablet (5 mg total) by mouth at bedtime.   pantoprazole  40 MG tablet Commonly known as: PROTONIX  TAKE 1 TABLET BY MOUTH EVERY DAY   PEG 3350  17 g Pack Take 17 g by mouth daily as needed.   prochlorperazine  10 MG tablet Commonly known as: COMPAZINE  Take 1 tablet (10 mg total) by mouth every 6 (six) hours as needed for nausea or vomiting (Zofran  resumes 12/16).   QUEtiapine  400 MG tablet Commonly known as: SEROQUEL  TAKE 2 TABLETS (800 MG TOTAL) BY MOUTH AT BEDTIME.   rosuvastatin  40 MG tablet  Commonly known as: CRESTOR  Take 1 tablet (40 mg total) by mouth daily.   senna 8.6 MG Tabs tablet Commonly known as: SENOKOT Take 1 tablet (8.6 mg total) by mouth 2 (two) times daily.   talazoparib tosylate  0.75 MG capsule Commonly known as: TALZENNA  Take 1 capsule (0.75 mg total) by mouth daily.   Xarelto  20 MG Tabs tablet Generic drug: rivaroxaban  Take 1 tablet (20 mg total) by mouth daily with supper.        Allergies:  Allergies  Allergen Reactions   Lithium Other (See Comments)    Spinal fluid built up in brain   Dulaglutide  Nausea And Vomiting and Other (See Comments)    TRULICITY    Nitrofuran Derivatives Itching    Nitrofurantoin  Mono- MCR   Penicillins Other (See Comments)    UNKNOWN CHILDHOOD REACTION    Past Medical History, Surgical history, Social history, and Family History were reviewed and updated.  Review of Systems: Review of Systems  Constitutional:  Positive for malaise/fatigue.  HENT: Negative.    Eyes: Negative.   Respiratory: Negative.    Cardiovascular:  Negative for leg swelling.  Gastrointestinal:  Positive for nausea.  Genitourinary: Negative.   Musculoskeletal:  Positive for joint pain and myalgias.   Skin: Negative.   Neurological:  Positive for tingling.  Endo/Heme/Allergies: Negative.   Psychiatric/Behavioral: Negative.       Physical Exam:  height is 5\' 2"  (1.575 m) and weight is 159 lb (72.1 kg). Her oral temperature is 98.6 F (37 C). Her blood pressure is 90/56 (abnormal) and her pulse is 88. Her respiration is 18 and oxygen saturation is 100%.   Wt Readings from Last 3 Encounters:  03/01/24 159 lb (72.1 kg)  02/24/24 161 lb 9.6 oz (73.3 kg)  02/16/24 165 lb (74.8 kg)    Physical Exam Vitals reviewed.  HENT:     Head: Normocephalic and atraumatic.     Comments: She does not have any swelling now.  I do not feel any firmness on the left side of her neck.  No adenopathy is noted.  She has no intraoral lesions.  Eyes:     Pupils: Pupils are equal, round, and reactive to light.  Cardiovascular:     Rate and Rhythm: Normal rate and regular rhythm.     Heart sounds: Normal heart sounds.  Pulmonary:     Effort: Pulmonary effort is normal.     Breath sounds: Normal breath sounds.  Abdominal:     General: Bowel sounds are normal. There is no distension.     Palpations: Abdomen is soft.     Tenderness: There is no abdominal tenderness. There is no guarding.  Musculoskeletal:        General: Tenderness (left ribcage throughout) present. No deformity. Normal range of motion.     Cervical back: Normal range of motion.  Lymphadenopathy:     Cervical: No cervical adenopathy.  Skin:    General: Skin is warm and dry.     Coloration: Skin is not jaundiced or pale.     Findings: No bruising, erythema or rash.  Neurological:     Mental Status: She is alert and oriented to person, place, and time.  Psychiatric:        Behavior: Behavior normal.        Thought Content: Thought content normal.        Judgment: Judgment normal.     Lab Results  Component Value Date   WBC 3.3 (L) 03/01/2024  HGB 8.2 (L) 03/01/2024   HCT 23.9 (L) 03/01/2024   MCV 98.0 03/01/2024   PLT 93  (L) 03/01/2024   Lab Results  Component Value Date   FERRITIN 33 08/25/2023   IRON 61 08/25/2023   TIBC 400 08/25/2023   UIBC 339 08/25/2023   IRONPCTSAT 15 08/25/2023   Lab Results  Component Value Date   RETICCTPCT 4.0 (H) 08/05/2022   RBC 2.44 (L) 03/01/2024   No results found for: "KPAFRELGTCHN", "LAMBDASER", "KAPLAMBRATIO" No results found for: "IGGSERUM", "IGA", "IGMSERUM" No results found for: "TOTALPROTELP", "ALBUMINELP", "A1GS", "A2GS", "BETS", "BETA2SER", "GAMS", "MSPIKE", "SPEI"   Chemistry      Component Value Date/Time   NA 134 (L) 03/01/2024 1051   K 3.9 03/01/2024 1051   CL 102 03/01/2024 1051   CO2 22 03/01/2024 1051   BUN 15 03/01/2024 1051   CREATININE 1.23 (H) 03/01/2024 1051      Component Value Date/Time   CALCIUM  9.1 03/01/2024 1051   ALKPHOS 84 03/01/2024 1051   AST 16 03/01/2024 1051   ALT 12 03/01/2024 1051   BILITOT 0.4 03/01/2024 1051     Encounter Diagnoses  Name Primary?   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 Marciano is a very pleasant  58 yo caucasian female with recurrent ductal carcinoma of the left breast.  It is amazing that the recurrence was just been in the neck.  At this time I want her to continue to HOLD her Talazoparib and Xarelto  until she is seen next week as her platelet count is still below 100 and her Hgb has trended down to 8.2. Likely will plan to restart both of these medications at her follow up next week.   In terms of her symptoms I have suggested a MR of her brain along with a CT of her neck and radiographs of her ribcage. She is agreeable and these orders have been placed.  Talazoparib has limited CNS penetration so symptoms may be due to this medication if MR brain is normal.   RTC next week as planned with APP, labs (CBC, sample, iron, ferritin, retic, B12, folate)  Sharla Davis, PA-C 5/8/20252:58 PM

## 2024-03-01 NOTE — Patient Instructions (Signed)

## 2024-03-01 NOTE — Telephone Encounter (Signed)
 Per Sunnie England, PA, I left a message for the patient to HOLD Xarelto . Also, the Talzenna  (Talazoparib Tosylate ) minimally crosses the blood brain barrier. Instructed her to call the office if she has any concerns or questions.

## 2024-03-02 ENCOUNTER — Ambulatory Visit (HOSPITAL_COMMUNITY)
Admission: RE | Admit: 2024-03-02 | Discharge: 2024-03-02 | Disposition: A | Discharge status: Home / Self Care | Source: Ambulatory Visit | Attending: Medical Oncology | Admitting: Medical Oncology

## 2024-03-02 ENCOUNTER — Ambulatory Visit (HOSPITAL_COMMUNITY)

## 2024-03-02 DIAGNOSIS — C770 Secondary and unspecified malignant neoplasm of lymph nodes of head, face and neck: Secondary | ICD-10-CM | POA: Insufficient documentation

## 2024-03-02 DIAGNOSIS — C50011 Malignant neoplasm of nipple and areola, right female breast: Secondary | ICD-10-CM | POA: Insufficient documentation

## 2024-03-02 LAB — CANCER ANTIGEN 27.29: CA 27.29: 18.9 U/mL (ref 0.0–38.6)

## 2024-03-02 MED ORDER — GADOBUTROL 1 MMOL/ML IV SOLN
7.0000 mL | Freq: Once | INTRAVENOUS | Status: AC | PRN
  Administered 2024-03-02: 7 mL via INTRAVENOUS

## 2024-03-03 ENCOUNTER — Encounter: Payer: Self-pay | Admitting: *Deleted

## 2024-03-05 ENCOUNTER — Encounter: Payer: Self-pay | Admitting: Hematology & Oncology

## 2024-03-05 ENCOUNTER — Telehealth: Payer: Self-pay | Admitting: Medical Oncology

## 2024-03-05 ENCOUNTER — Ambulatory Visit (HOSPITAL_BASED_OUTPATIENT_CLINIC_OR_DEPARTMENT_OTHER)
Admission: RE | Admit: 2024-03-05 | Discharge: 2024-03-05 | Disposition: A | Discharge status: Home / Self Care | Source: Ambulatory Visit | Attending: Medical Oncology | Admitting: Medical Oncology

## 2024-03-05 DIAGNOSIS — C50011 Malignant neoplasm of nipple and areola, right female breast: Secondary | ICD-10-CM | POA: Diagnosis present

## 2024-03-05 DIAGNOSIS — C770 Secondary and unspecified malignant neoplasm of lymph nodes of head, face and neck: Secondary | ICD-10-CM | POA: Insufficient documentation

## 2024-03-05 MED ORDER — IOHEXOL 300 MG/ML  SOLN
75.0000 mL | Freq: Once | INTRAMUSCULAR | Status: AC | PRN
  Administered 2024-03-05: 75 mL via INTRAVENOUS

## 2024-03-05 NOTE — Telephone Encounter (Signed)
 Asked patient to return my call for some good news.   Please relay the following  "Her brain MRI did not show any evidence of metastatic disease- YAY! It did show one area that may represent a small (likely benign) mass though that could be the culprit of her symptoms. I would recommend a consultation with Dr. Mark Sil our wonder Neuro oncologist to get his opinion on if any options may help with her symptoms. "

## 2024-03-06 ENCOUNTER — Ambulatory Visit: Payer: Self-pay | Admitting: Medical Oncology

## 2024-03-06 ENCOUNTER — Inpatient Hospital Stay

## 2024-03-06 ENCOUNTER — Encounter: Payer: Self-pay | Admitting: Medical Oncology

## 2024-03-06 ENCOUNTER — Encounter: Payer: Self-pay | Admitting: *Deleted

## 2024-03-06 ENCOUNTER — Inpatient Hospital Stay (HOSPITAL_BASED_OUTPATIENT_CLINIC_OR_DEPARTMENT_OTHER): Admitting: Medical Oncology

## 2024-03-06 VITALS — BP 120/77 | HR 107 | Temp 98.6°F | Resp 18 | Ht 62.0 in | Wt 156.0 lb

## 2024-03-06 DIAGNOSIS — C50012 Malignant neoplasm of nipple and areola, left female breast: Secondary | ICD-10-CM | POA: Diagnosis not present

## 2024-03-06 DIAGNOSIS — D6959 Other secondary thrombocytopenia: Secondary | ICD-10-CM | POA: Diagnosis not present

## 2024-03-06 DIAGNOSIS — E1165 Type 2 diabetes mellitus with hyperglycemia: Secondary | ICD-10-CM

## 2024-03-06 DIAGNOSIS — C50011 Malignant neoplasm of nipple and areola, right female breast: Secondary | ICD-10-CM

## 2024-03-06 DIAGNOSIS — C770 Secondary and unspecified malignant neoplasm of lymph nodes of head, face and neck: Secondary | ICD-10-CM | POA: Diagnosis not present

## 2024-03-06 DIAGNOSIS — D701 Agranulocytosis secondary to cancer chemotherapy: Secondary | ICD-10-CM

## 2024-03-06 DIAGNOSIS — T451X5A Adverse effect of antineoplastic and immunosuppressive drugs, initial encounter: Secondary | ICD-10-CM | POA: Diagnosis not present

## 2024-03-06 DIAGNOSIS — D649 Anemia, unspecified: Secondary | ICD-10-CM

## 2024-03-06 DIAGNOSIS — D6481 Anemia due to antineoplastic chemotherapy: Secondary | ICD-10-CM | POA: Diagnosis not present

## 2024-03-06 LAB — SAMPLE TO BLOOD BANK

## 2024-03-06 LAB — CBC WITH DIFFERENTIAL (CANCER CENTER ONLY)
Abs Immature Granulocytes: 0.09 10*3/uL — ABNORMAL HIGH (ref 0.00–0.07)
Basophils Absolute: 0 10*3/uL (ref 0.0–0.1)
Basophils Relative: 1 %
Eosinophils Absolute: 0.1 10*3/uL (ref 0.0–0.5)
Eosinophils Relative: 2 %
HCT: 24.8 % — ABNORMAL LOW (ref 36.0–46.0)
Hemoglobin: 8.7 g/dL — ABNORMAL LOW (ref 12.0–15.0)
Immature Granulocytes: 3 %
Lymphocytes Relative: 27 %
Lymphs Abs: 0.8 10*3/uL (ref 0.7–4.0)
MCH: 34.1 pg — ABNORMAL HIGH (ref 26.0–34.0)
MCHC: 35.1 g/dL (ref 30.0–36.0)
MCV: 97.3 fL (ref 80.0–100.0)
Monocytes Absolute: 0.5 10*3/uL (ref 0.1–1.0)
Monocytes Relative: 15 %
Neutro Abs: 1.6 10*3/uL — ABNORMAL LOW (ref 1.7–7.7)
Neutrophils Relative %: 52 %
Platelet Count: 109 10*3/uL — ABNORMAL LOW (ref 150–400)
RBC: 2.55 MIL/uL — ABNORMAL LOW (ref 3.87–5.11)
RDW: 18.1 % — ABNORMAL HIGH (ref 11.5–15.5)
WBC Count: 3.1 10*3/uL — ABNORMAL LOW (ref 4.0–10.5)
nRBC: 1 % — ABNORMAL HIGH (ref 0.0–0.2)

## 2024-03-06 LAB — CMP (CANCER CENTER ONLY)
ALT: 19 U/L (ref 0–44)
AST: 16 U/L (ref 15–41)
Albumin: 4.1 g/dL (ref 3.5–5.0)
Alkaline Phosphatase: 82 U/L (ref 38–126)
Anion gap: 9 (ref 5–15)
BUN: 9 mg/dL (ref 6–20)
CO2: 23 mmol/L (ref 22–32)
Calcium: 9.2 mg/dL (ref 8.9–10.3)
Chloride: 105 mmol/L (ref 98–111)
Creatinine: 1.08 mg/dL — ABNORMAL HIGH (ref 0.44–1.00)
GFR, Estimated: >60 mL/min (ref >60)
Glucose, Bld: 201 mg/dL — ABNORMAL HIGH (ref 70–99)
Potassium: 3.8 mmol/L (ref 3.5–5.1)
Sodium: 137 mmol/L (ref 135–145)
Total Bilirubin: 0.3 mg/dL (ref 0.0–1.2)
Total Protein: 6.6 g/dL (ref 6.5–8.1)

## 2024-03-06 LAB — IRON AND IRON BINDING CAPACITY (CC-WL,HP ONLY)
Iron: 82 ug/dL (ref 28–170)
Saturation Ratios: 26 % (ref 10.4–31.8)
TIBC: 314 ug/dL (ref 250–450)
UIBC: 232 ug/dL (ref 148–442)

## 2024-03-06 LAB — RETIC PANEL
Immature Retic Fract: 32.6 % — ABNORMAL HIGH (ref 2.3–15.9)
RBC.: 2.56 MIL/uL — ABNORMAL LOW (ref 3.87–5.11)
Retic Count, Absolute: 89.3 10*3/uL (ref 19.0–186.0)
Retic Ct Pct: 3.5 % — ABNORMAL HIGH (ref 0.4–3.1)
Reticulocyte Hemoglobin: 37.2 pg (ref >27.9)

## 2024-03-06 LAB — FOLATE: Folate: 17.9 ng/mL (ref >5.9)

## 2024-03-06 LAB — FERRITIN: Ferritin: 100 ng/mL (ref 11–307)

## 2024-03-06 LAB — VITAMIN B12: Vitamin B-12: 219 pg/mL (ref 180–914)

## 2024-03-06 MED ORDER — RIVAROXABAN 10 MG PO TABS: 10.0000 mg | ORAL_TABLET | Freq: Every day | ORAL | 6 refills | Status: AC

## 2024-03-06 MED ORDER — HEPARIN SOD (PORK) LOCK FLUSH 100 UNIT/ML IV SOLN
500.0000 [IU] | Freq: Once | INTRAVENOUS | Status: AC
  Administered 2024-03-06: 500 [IU] via INTRAVENOUS

## 2024-03-06 MED ORDER — SODIUM CHLORIDE 0.9% FLUSH
10.0000 mL | Freq: Once | INTRAVENOUS | Status: AC
  Administered 2024-03-06: 10 mL via INTRAVENOUS

## 2024-03-06 MED ORDER — TALAZOPARIB TOSYLATE 0.5 MG PO CAPS: 0.5000 mg | ORAL_CAPSULE | Freq: Every day | ORAL | 2 refills | Status: DC

## 2024-03-06 NOTE — Progress Notes (Addendum)
 This is Hematology and Oncology Follow Up Visit  Ariana Herrera [id number] 21-Apr-1966 58 y.o. 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 daily-start on 01/18/2024 Talazoparib 1 mg p.o. daily-start on 12/22/2023 -  changed to 0.75 mg po q day on 02/16/2024   Interim History:  [name] is here today for follow-up for her thrombocytopenia which was thought to be secondary to her Talazoparib and acute UTI:  She has tolerated the Talazoparib well aside from some reduction in blood counts. This was held on 02/16/2024 for 1 week with plan to restart her on 0.75 mg a day. Unfortunately this never occurred as on 02/24/2024 she was seen by her PCP for a UTI and her platelet count was 16. She was referred to the hospital. She was seen in the hospital by on call Hem/Onc Dr. Marton Sleeper who advised closely monitoring of blood counts, continuing to hold her Talazoparib and to hold her Xeralto instead of platelet transfusion or admission.    At her last visit on 03/01/2024 they reports that she was feeling ok overall. Her UTI symptoms had fully resolved. She denied any fevers or abdominal pains. She did report that she fell over a few items at home and fell onto her left ribcage. She is a bit sore from this in her ribcage area. No abdominal bruising, abdominal pain, SOB, hemoptysis. She denies hitting her head or having any LOC. She denies any new headaches but admit to some dizziness and visual  changes over the past few weeks (prior to the fall). This has been accompanies by some neck pain. She denies any extremity weakness, facial weakness or slurred speech. There has been no bleeding to her knowledge: denies epistaxis, gingivitis, hemoptysis, hematemesis, hematuria, melena, excessive bruising, blood donation.   We elected to have her hold her Xarelto  and Talzenna  until at least today's visit to allow counts to recover further.   Today she reports that she is feeling a bit better overall. She is hopeful to get back on her Xarelto  and Talzenna . She has noticed some increased insomnia.   There has been no bleeding to her knowledge: denies epistaxis, gingivitis, hemoptysis, hematemesis, hematuria, melena, excessive bruising, blood donation.   Her last CA 27.29 on 02/06/2024 was 35.1.  This is holding steady.  She has had no change in bowel or bladder habits.  She has had a little bit of constipation.  She has had no fever.  Overall, I would have to say that her performance status is probably ECOG 1.  Wt Readings from Last 3 Encounters:  03/06/24 156 lb (70.8 kg)  03/01/24 159 lb (72.1 kg)  02/24/24 161 lb 9.6 oz (73.3 kg)    Medications:  Allergies as of 03/06/2024       Reactions   Lithium Other (See Comments)   Spinal fluid built up in brain   Dulaglutide  Nausea And Vomiting, Other (See Comments)   TRULICITY    Nitrofuran Derivatives Itching   Nitrofurantoin  Mono-  MCR   Penicillins Other (See Comments)   UNKNOWN CHILDHOOD REACTION        Medication List        Accurate as of Mar 06, 2024  3:01 PM. If you have any questions, ask your nurse or doctor.          Accu-Chek Guide test strip Generic drug: glucose blood 3 (three) times daily.   albuterol  1.25 MG/3ML nebulizer solution Commonly known as: ACCUNEB  Take 1 ampule by nebulization every 6 (six) hours as needed for wheezing or shortness of breath.   albuterol  108 (90 Base) MCG/ACT inhaler Commonly  known as: VENTOLIN  HFA Inhale 2 puffs into the lungs every 6 (six) hours as needed for wheezing or shortness of breath.   cyclobenzaprine  5 MG tablet Commonly known as: FLEXERIL  Take 5 mg by mouth 2 (two) times daily as needed for muscle spasms.   Dexcom G7 Sensor Misc Change sensor every 10 days   fenofibrate  145 MG tablet Commonly known as: TRICOR  Take 145 mg by mouth daily.   gabapentin  300 MG capsule Commonly known as: NEURONTIN  Take 1 capsule (300 mg total) by mouth 3 (three) times daily.   HYDROcodone -acetaminophen  7.5-325 MG tablet Commonly known as: NORCO Take 1 tablet by mouth every 6 (six) hours as needed for moderate pain (pain score 4-6).   hydrOXYzine 25 MG tablet Commonly known as: ATARAX Take 25 mg by mouth 4 (four) times daily.   lidocaine -prilocaine  cream Commonly known as: EMLA  APPLY TOPICALLY 1 APPLICATION AS NEEDED   linaclotide  290 MCG Caps capsule Commonly known as: Linzess  Take 1 capsule (290 mcg total) by mouth daily before breakfast.   metFORMIN  1000 MG tablet Commonly known as: GLUCOPHAGE  Take 1,000 mg by mouth 2 (two) times daily with a meal.   metoprolol  tartrate 25 MG tablet Commonly known as: LOPRESSOR  Take 1 tablet (25 mg total) by mouth 2 (two) times daily.   nitroGLYCERIN  0.4 MG SL tablet Commonly known as: NITROSTAT  Place 1 tablet (0.4 mg total) under the tongue every 5 (five) minutes as needed for chest pain.   OLANZapine  10 MG tablet Commonly known as: ZYPREXA  Take 1 tablet (10 mg total) by mouth at bedtime.   Omnipod 5 DexG7G6 Pods Gen 5 Misc Apply one pod every 3 days to administer insulin  continuously.   ondansetron  4 MG disintegrating tablet Commonly known as: ZOFRAN -ODT Take 1 tablet (4 mg total) by mouth every 8 (eight) hours.   ondansetron  8 MG tablet Commonly known as: ZOFRAN  Take 1 tablet (8 mg total) by mouth every 8 (eight) hours as needed for nausea or vomiting. START ON THE THIRD DAY AFTER CHEMOTHERAPY.    oxybutynin  5 MG 24 hr tablet Commonly known as: DITROPAN -XL Take 1 tablet (5 mg total) by mouth at bedtime.   pantoprazole  40 MG tablet Commonly known as: PROTONIX  TAKE 1 TABLET BY MOUTH EVERY DAY   PEG 3350  17 g Pack Take 17 g by mouth daily as needed.   prochlorperazine  10 MG tablet Commonly known as: COMPAZINE  Take 1 tablet (10 mg total) by mouth every 6 (six) hours as needed for nausea or vomiting (Zofran  resumes 12/16).   QUEtiapine  400 MG tablet Commonly known as: SEROQUEL  TAKE 2 TABLETS (800 MG TOTAL) BY MOUTH AT BEDTIME.   rivaroxaban  10 MG Tabs tablet Commonly known as: XARELTO  Take 1 tablet (10 mg total) by mouth daily. What changed:  medication strength how much to take when to take this Changed by: Sharla Davis  rosuvastatin  40 MG tablet Commonly known as: CRESTOR  Take 1 tablet (40 mg total) by mouth daily.   senna 8.6 MG Tabs tablet Commonly known as: SENOKOT Take 1 tablet (8.6 mg total) by mouth 2 (two) times daily.   talazoparib tosylate  0.5 MG capsule Commonly known as: Talzenna  Take 1 capsule (0.5 mg total) by mouth daily. What changed:  medication strength how much to take Changed by: Sharla Davis        Allergies:  Allergies  Allergen Reactions   Lithium Other (See Comments)    Spinal fluid built up in brain   Dulaglutide  Nausea And Vomiting and Other (See Comments)    TRULICITY    Nitrofuran Derivatives Itching    Nitrofurantoin  Mono- MCR   Penicillins Other (See Comments)    UNKNOWN CHILDHOOD REACTION    Past Medical History, Surgical history, Social history, and Family History were reviewed and updated.  Review of Systems: Review of Systems  Constitutional:  Positive for malaise/fatigue.  HENT: Negative.    Eyes: Negative.   Respiratory: Negative.    Cardiovascular:  Negative for leg swelling.  Gastrointestinal:  Positive for nausea.  Genitourinary: Negative.   Musculoskeletal:  Positive for joint pain and  myalgias.  Skin: Negative.   Neurological:  Positive for tingling.  Endo/Heme/Allergies: Negative.   Psychiatric/Behavioral: Negative.       Physical Exam:  height is 5\' 2"  (1.575 m) and weight is 156 lb (70.8 kg). Her oral temperature is 98.6 F (37 C). Her blood pressure is 120/77 and her pulse is 107 (abnormal). Her respiration is 18 and oxygen saturation is 100%.   Wt Readings from Last 3 Encounters:  03/06/24 156 lb (70.8 kg)  03/01/24 159 lb (72.1 kg)  02/24/24 161 lb 9.6 oz (73.3 kg)    Physical Exam Vitals reviewed.  HENT:     Head: Normocephalic and atraumatic.     Comments: She does not have any swelling now.  I do not feel any firmness on the left side of her neck.  No adenopathy is noted.  She has no intraoral lesions.  Eyes:     Pupils: Pupils are equal, round, and reactive to light.  Cardiovascular:     Rate and Rhythm: Normal rate and regular rhythm.     Heart sounds: Normal heart sounds.  Pulmonary:     Effort: Pulmonary effort is normal.     Breath sounds: Normal breath sounds.  Abdominal:     General: Bowel sounds are normal. There is no distension.     Palpations: Abdomen is soft.     Tenderness: There is no abdominal tenderness. There is no guarding.  Musculoskeletal:        General: No deformity. Normal range of motion.     Cervical back: Normal range of motion.  Lymphadenopathy:     Cervical: No cervical adenopathy.  Skin:    General: Skin is warm and dry.     Coloration: Skin is not jaundiced or pale.     Findings: No bruising, erythema or rash.  Neurological:     Mental Status: She is alert and oriented to person, place, and time.  Psychiatric:        Behavior: Behavior normal.        Thought Content: Thought content normal.        Judgment: Judgment normal.     Lab Results  Component Value Date   WBC 3.1 (L) 03/06/2024   HGB 8.7 (L) 03/06/2024   HCT 24.8 (  L) 03/06/2024   MCV 97.3 03/06/2024   PLT 109 (L) 03/06/2024   Lab Results   Component Value Date   FERRITIN 33 08/25/2023   IRON 82 03/06/2024   TIBC 314 03/06/2024   UIBC 232 03/06/2024   IRONPCTSAT 26 03/06/2024   Lab Results  Component Value Date   RETICCTPCT 3.5 (H) 03/06/2024   RBC 2.55 (L) 03/06/2024   RBC 2.56 (L) 03/06/2024   No results found for: "KPAFRELGTCHN", "LAMBDASER", "KAPLAMBRATIO" No results found for: "IGGSERUM", "IGA", "IGMSERUM" No results found for: "TOTALPROTELP", "ALBUMINELP", "A1GS", "A2GS", "BETS", "BETA2SER", "GAMS", "MSPIKE", "SPEI"   Chemistry      Component Value Date/Time   NA 137 03/06/2024 1020   K 3.8 03/06/2024 1020   CL 105 03/06/2024 1020   CO2 23 03/06/2024 1020   BUN 9 03/06/2024 1020   CREATININE 1.08 (H) 03/06/2024 1020      Component Value Date/Time   CALCIUM  9.2 03/06/2024 1020   ALKPHOS 82 03/06/2024 1020   AST 16 03/06/2024 1020   ALT 19 03/06/2024 1020   BILITOT 0.3 03/06/2024 1020     Encounter Diagnoses  Name Primary?   Chemotherapy induced neutropenia (HCC) Yes   Chemotherapy-induced thrombocytopenia    Antineoplastic chemotherapy induced anemia    Secondary malignant neoplasm of cervical lymph node (HCC)    Malignant neoplasm involving both nipple and areola of right breast in female, unspecified estrogen receptor status (HCC)      Impression and Plan: Ms. Tokarz is a very pleasant  58 yo caucasian female with recurrent ductal carcinoma of the left breast.  It is amazing that the recurrence was just been in the neck.   We reviewed her recent MRI brain which did not show any evidence of metastatic disease but did show some chronic micro hemorrhages and small meningioma. We discussed the referral placed to Neuro Onc for her symptoms. We also discussed risks/benefits on Xarelto  use. At this time we are going to return her to Xarelto  therapy given her increased risk of thrombotic event but am going to dose reduce to 10 mg once daily given her evidence of micro hemorrhages. Red flags reviewed.    We are also going to restart her Talzenna . CBC/CMP reviewed. I have discussed this with Dr. [name] and our pharmacy team. At this time given her history and counts we have elected to restart her on dose reduced 0.5 mg once daily.  Discussed treatment options with PCP regarding her insomnia. At this time we have elected to seek additional support due to her current Seroquel  dose. PCP will manage and alert patient further. Pt aware by our office to plan  RTC 2 weeks MD, port labs ( CBC w/ CMP, CA 27.29, sample to blood bank, [facility]) Day 2 1 unit of blood   Sharla Davis, PA-C 5/13/20253:01 PM

## 2024-03-06 NOTE — Patient Instructions (Signed)

## 2024-03-06 NOTE — Patient Instructions (Signed)
 You are ok to restart your Xarelto   Ok to restart your Talzenna  at reduced dose which will be called into the pharmacy today

## 2024-03-08 ENCOUNTER — Ambulatory Visit: Admitting: Medical Oncology

## 2024-03-08 ENCOUNTER — Other Ambulatory Visit

## 2024-03-08 ENCOUNTER — Other Ambulatory Visit: Payer: Self-pay

## 2024-03-08 ENCOUNTER — Inpatient Hospital Stay

## 2024-03-09 ENCOUNTER — Other Ambulatory Visit: Payer: Self-pay

## 2024-03-10 ENCOUNTER — Other Ambulatory Visit: Payer: Self-pay

## 2024-03-11 ENCOUNTER — Other Ambulatory Visit: Payer: Self-pay | Admitting: Physician Assistant

## 2024-03-11 DIAGNOSIS — N393 Stress incontinence (female) (male): Secondary | ICD-10-CM

## 2024-03-15 ENCOUNTER — Other Ambulatory Visit: Payer: Self-pay

## 2024-03-15 ENCOUNTER — Encounter: Payer: Self-pay | Admitting: Physician Assistant

## 2024-03-15 ENCOUNTER — Other Ambulatory Visit (HOSPITAL_COMMUNITY)
Admission: RE | Admit: 2024-03-15 | Discharge: 2024-03-15 | Disposition: A | Discharge status: Home / Self Care | Source: Ambulatory Visit | Attending: Physician Assistant | Admitting: Physician Assistant

## 2024-03-15 ENCOUNTER — Other Ambulatory Visit: Payer: Self-pay | Admitting: Hematology & Oncology

## 2024-03-15 ENCOUNTER — Other Ambulatory Visit (HOSPITAL_BASED_OUTPATIENT_CLINIC_OR_DEPARTMENT_OTHER): Payer: Self-pay

## 2024-03-15 ENCOUNTER — Ambulatory Visit (INDEPENDENT_AMBULATORY_CARE_PROVIDER_SITE_OTHER): Admitting: Physician Assistant

## 2024-03-15 VITALS — BP 97/59 | HR 106 | Ht 62.0 in | Wt 163.8 lb

## 2024-03-15 DIAGNOSIS — N898 Other specified noninflammatory disorders of vagina: Secondary | ICD-10-CM | POA: Diagnosis present

## 2024-03-15 DIAGNOSIS — R3915 Urgency of urination: Secondary | ICD-10-CM

## 2024-03-15 DIAGNOSIS — K219 Gastro-esophageal reflux disease without esophagitis: Secondary | ICD-10-CM

## 2024-03-15 LAB — POC URINALSYSI DIPSTICK (AUTOMATED)
Bilirubin, UA: NEGATIVE
Blood, UA: NEGATIVE
Glucose, UA: NEGATIVE
Ketones, UA: NEGATIVE
Nitrite, UA: NEGATIVE
Protein, UA: POSITIVE — AB
Spec Grav, UA: 1.01 (ref 1.010–1.025)
Urobilinogen, UA: 0.2 U/dL
pH, UA: 5 (ref 5.0–8.0)

## 2024-03-15 MED ORDER — OMEPRAZOLE 40 MG PO CPDR
40.0000 mg | DELAYED_RELEASE_CAPSULE | Freq: Two times a day (BID) | ORAL | 1 refills | Status: DC
Start: 2024-03-15 — End: 2024-04-24

## 2024-03-15 MED ORDER — MICONAZOLE NITRATE 200 MG VA SUPP
200.0000 mg | Freq: Every day | VAGINAL | 0 refills | Status: DC
Start: 2024-03-15 — End: 2024-08-16

## 2024-03-15 MED ORDER — HYDROCODONE-ACETAMINOPHEN 7.5-325 MG PO TABS
1.0000 | ORAL_TABLET | Freq: Four times a day (QID) | ORAL | 0 refills | Status: DC | PRN
Start: 2024-03-15 — End: 2024-04-13
  Filled 2024-03-15: qty 120, 30d supply, fill #0

## 2024-03-15 NOTE — Progress Notes (Signed)
 Established patient visit   Patient: Ariana Herrera   DOB: [birth year]   58 y.o. Female  MRN: [id number] Visit Date: 03/15/2024  Today's healthcare provider: Trenton Frock, PA-C  Cc. Discharge, itching  Subjective     Pt reports vaginal/vulvar itching for the last few days, denies dysuria or frequency but reports urgency. Some yellow discharge.  Medications: Outpatient Medications Prior to Visit  Medication Sig   ACCU-CHEK GUIDE test strip 3 (three) times daily.   albuterol  (ACCUNEB ) 1.25 MG/3ML nebulizer solution Take 1 ampule by nebulization every 6 (six) hours as needed for wheezing or shortness of breath.   albuterol  (VENTOLIN  HFA) 108 (90 Base) MCG/ACT inhaler Inhale 2 puffs into the lungs every 6 (six) hours as needed for wheezing or shortness of breath.   Continuous Glucose Sensor (DEXCOM G7 SENSOR) MISC Change sensor every 10 days   cyclobenzaprine  (FLEXERIL ) 5 MG tablet Take 5 mg by mouth 2 (two) times daily as needed for muscle spasms.   fenofibrate  (TRICOR ) 145 MG tablet Take 145 mg by mouth daily.   gabapentin  (NEURONTIN ) 300 MG capsule Take 1 capsule (300 mg total) by mouth 3 (three) times daily.   HYDROcodone -acetaminophen  (NORCO) 7.5-325 MG tablet Take 1 tablet by mouth every 6 (six) hours as needed for moderate pain (pain score 4-6).   hydrOXYzine (ATARAX) 25 MG tablet Take 25 mg by mouth 4 (four) times daily.   Insulin  Disposable Pump (OMNIPOD 5 DEXG7G6 PODS GEN 5) MISC Apply one pod every 3 days to administer insulin  continuously.   lidocaine -prilocaine  (EMLA ) cream APPLY TOPICALLY 1 APPLICATION AS NEEDED   linaclotide  (LINZESS ) 290 MCG CAPS capsule Take 1 capsule (290 mcg total) by mouth daily before breakfast.   metFORMIN  (GLUCOPHAGE ) 1000 MG tablet Take 1,000 mg by mouth 2 (two) times daily with a meal.   metoprolol  tartrate (LOPRESSOR ) 25 MG tablet Take 1 tablet (25 mg total) by mouth 2 (two) times daily.   nitroGLYCERIN  (NITROSTAT ) 0.4 MG SL tablet  Place 1 tablet (0.4 mg total) under the tongue every 5 (five) minutes as needed for chest pain. (Patient not taking: Reported on 01/18/2024)   OLANZapine  (ZYPREXA ) 10 MG tablet Take 1 tablet (10 mg total) by mouth at bedtime.   ondansetron  (ZOFRAN ) 8 MG tablet Take 1 tablet (8 mg total) by mouth every 8 (eight) hours as needed for nausea or vomiting. START ON THE THIRD DAY AFTER CHEMOTHERAPY.   ondansetron  (ZOFRAN -ODT) 4 MG disintegrating tablet Take 1 tablet (4 mg total) by mouth every 8 (eight) hours.   oxybutynin  (DITROPAN -XL) 5 MG 24 hr tablet TAKE 1 TABLET BY MOUTH EVERYDAY AT BEDTIME   polyethylene glycol (MIRALAX  / GLYCOLAX ) 17 g packet Take 17 g by mouth daily as needed. (Patient not taking: Reported on 03/06/2024)   prochlorperazine  (COMPAZINE ) 10 MG tablet Take 1 tablet (10 mg total) by mouth every 6 (six) hours as needed for nausea or vomiting (Zofran  resumes 12/16). (Patient not taking: Reported on 03/06/2024)   QUEtiapine  (SEROQUEL ) 400 MG tablet TAKE 2 TABLETS (800 MG TOTAL) BY MOUTH AT BEDTIME.   rivaroxaban  (XARELTO ) 10 MG TABS tablet Take 1 tablet (10 mg total) by mouth daily.   rosuvastatin  (CRESTOR ) 40 MG tablet Take 1 tablet (40 mg total) by mouth daily.   senna (SENOKOT) 8.6 MG TABS tablet Take 1 tablet (8.6 mg total) by mouth 2 (two) times daily.   talazoparib tosylate  (TALZENNA ) 0.5 MG capsule Take 1 capsule (0.5 mg total) by mouth daily.   [  DISCONTINUED] pantoprazole  (PROTONIX ) 40 MG tablet TAKE 1 TABLET BY MOUTH EVERY DAY   Facility-Administered Medications Prior to Visit  Medication Dose Route Frequency Provider   sodium chloride  flush (NS) 0.9 % injection 10 mL  10 mL Intravenous PRN Ivor Mars, MD    Review of Systems  Constitutional:  Negative for fatigue and fever.  Respiratory:  Negative for cough and shortness of breath.   Cardiovascular:  Negative for chest pain and leg swelling.  Gastrointestinal:  Negative for abdominal pain.  Genitourinary:  Positive for  urgency and vaginal discharge.  Neurological:  Negative for dizziness and headaches.       Objective    BP (!) 97/59   Pulse (!) 106   Ht 5\' 2"  (1.575 m)   Wt 163 lb 12.8 oz (74.3 kg)   LMP 10/04/2016 Comment: after "procedure to stop bleeding"  BMI 29.96 kg/m    Physical Exam Constitutional:      General: She is awake.     Appearance: She is well-developed.  HENT:     Head: Normocephalic.  Eyes:     Conjunctiva/sclera: Conjunctivae normal.  Cardiovascular:     Rate and Rhythm: Normal rate and regular rhythm.     Heart sounds: Normal heart sounds.  Pulmonary:     Effort: Pulmonary effort is normal.     Breath sounds: Normal breath sounds.  Skin:    General: Skin is warm.  Neurological:     Mental Status: She is alert and oriented to person, place, and time.  Psychiatric:        Attention and Perception: Attention normal.        Mood and Affect: Mood normal.        Speech: Speech normal.        Behavior: Behavior is cooperative.      Results for orders placed or performed in visit on 03/15/24  POCT Urinalysis Dipstick (Automated)  Result Value Ref Range   Color, UA yellow    Clarity, UA clear    Glucose, UA Negative Negative   Bilirubin, UA neg    Ketones, UA neg    Spec Grav, UA 1.010 1.010 - 1.025   Blood, UA neg    pH, UA 5.0 5.0 - 8.0   Protein, UA Positive (A) Negative   Urobilinogen, UA 0.2 0.2 or 1.0 E.U./dL   Nitrite, UA neg    Leukocytes, UA Small (1+) (A) Negative    Assessment & Plan    Urinary urgency -     POCT Urinalysis Dipstick (Automated) -     Urine Culture  Vaginal itching -     Cervicovaginal ancillary only -     Miconazole Nitrate; Place 1 suppository (200 mg total) vaginally at bedtime.  Dispense: 3 suppository; Refill: 0  Gastroesophageal reflux disease, unspecified whether esophagitis present -     Omeprazole ; Take 1 capsule (40 mg total) by mouth in the morning and at bedtime.  Dispense: 180 capsule; Refill: 1   Pt  comfortable w/ self swab, given several qtc prolonging agents, cannot rx diflucan, rx vaginal suppository miconazole.   UA with small leuks but limited symptoms, ordering urine culture will tx pending results, given recent abx use  Return if symptoms worsen or fail to improve.       Trenton Frock, PA-C  [facility] [phone] (phone) [phone] (fax)  [facility]

## 2024-03-16 ENCOUNTER — Other Ambulatory Visit: Payer: Self-pay | Admitting: Hematology & Oncology

## 2024-03-17 LAB — URINE CULTURE
MICRO NUMBER:: 16489699
SPECIMEN QUALITY:: ADEQUATE

## 2024-03-20 ENCOUNTER — Ambulatory Visit: Payer: Self-pay | Admitting: Physician Assistant

## 2024-03-20 ENCOUNTER — Inpatient Hospital Stay (HOSPITAL_BASED_OUTPATIENT_CLINIC_OR_DEPARTMENT_OTHER): Admitting: Hematology & Oncology

## 2024-03-20 ENCOUNTER — Encounter: Payer: Self-pay | Admitting: Hematology & Oncology

## 2024-03-20 ENCOUNTER — Inpatient Hospital Stay

## 2024-03-20 ENCOUNTER — Other Ambulatory Visit: Payer: Self-pay

## 2024-03-20 VITALS — BP 117/55 | HR 93 | Temp 98.0°F | Resp 16 | Ht 62.0 in | Wt 160.0 lb

## 2024-03-20 DIAGNOSIS — D6959 Other secondary thrombocytopenia: Secondary | ICD-10-CM

## 2024-03-20 DIAGNOSIS — T451X5A Adverse effect of antineoplastic and immunosuppressive drugs, initial encounter: Secondary | ICD-10-CM

## 2024-03-20 DIAGNOSIS — N3000 Acute cystitis without hematuria: Secondary | ICD-10-CM

## 2024-03-20 DIAGNOSIS — C7989 Secondary malignant neoplasm of other specified sites: Secondary | ICD-10-CM | POA: Diagnosis not present

## 2024-03-20 DIAGNOSIS — D6481 Anemia due to antineoplastic chemotherapy: Secondary | ICD-10-CM

## 2024-03-20 DIAGNOSIS — C50012 Malignant neoplasm of nipple and areola, left female breast: Secondary | ICD-10-CM | POA: Diagnosis not present

## 2024-03-20 DIAGNOSIS — C50011 Malignant neoplasm of nipple and areola, right female breast: Secondary | ICD-10-CM

## 2024-03-20 DIAGNOSIS — Z86718 Personal history of other venous thrombosis and embolism: Secondary | ICD-10-CM | POA: Diagnosis not present

## 2024-03-20 DIAGNOSIS — C770 Secondary and unspecified malignant neoplasm of lymph nodes of head, face and neck: Secondary | ICD-10-CM

## 2024-03-20 DIAGNOSIS — I82C19 Acute embolism and thrombosis of unspecified internal jugular vein: Secondary | ICD-10-CM

## 2024-03-20 LAB — CMP (CANCER CENTER ONLY)
ALT: 12 U/L (ref 0–44)
AST: 18 U/L (ref 15–41)
Albumin: 4.1 g/dL (ref 3.5–5.0)
Alkaline Phosphatase: 80 U/L (ref 38–126)
Anion gap: 9 (ref 5–15)
BUN: 18 mg/dL (ref 6–20)
CO2: 24 mmol/L (ref 22–32)
Calcium: 9.4 mg/dL (ref 8.9–10.3)
Chloride: 102 mmol/L (ref 98–111)
Creatinine: 0.86 mg/dL (ref 0.44–1.00)
GFR, Estimated: >60 mL/min (ref >60)
Glucose, Bld: 173 mg/dL — ABNORMAL HIGH (ref 70–99)
Potassium: 3.8 mmol/L (ref 3.5–5.1)
Sodium: 135 mmol/L (ref 135–145)
Total Bilirubin: 0.3 mg/dL (ref 0.0–1.2)
Total Protein: 6.4 g/dL — ABNORMAL LOW (ref 6.5–8.1)

## 2024-03-20 LAB — CBC WITH DIFFERENTIAL (CANCER CENTER ONLY)
Abs Immature Granulocytes: 0.02 10*3/uL (ref 0.00–0.07)
Basophils Absolute: 0 10*3/uL (ref 0.0–0.1)
Basophils Relative: 0 %
Eosinophils Absolute: 0.1 10*3/uL (ref 0.0–0.5)
Eosinophils Relative: 1 %
HCT: 26.8 % — ABNORMAL LOW (ref 36.0–46.0)
Hemoglobin: 8.9 g/dL — ABNORMAL LOW (ref 12.0–15.0)
Immature Granulocytes: 0 %
Lymphocytes Relative: 17 %
Lymphs Abs: 0.8 10*3/uL (ref 0.7–4.0)
MCH: 34.2 pg — ABNORMAL HIGH (ref 26.0–34.0)
MCHC: 33.2 g/dL (ref 30.0–36.0)
MCV: 103.1 fL — ABNORMAL HIGH (ref 80.0–100.0)
Monocytes Absolute: 0.4 10*3/uL (ref 0.1–1.0)
Monocytes Relative: 9 %
Neutro Abs: 3.3 10*3/uL (ref 1.7–7.7)
Neutrophils Relative %: 73 %
Platelet Count: 119 10*3/uL — ABNORMAL LOW (ref 150–400)
RBC: 2.6 MIL/uL — ABNORMAL LOW (ref 3.87–5.11)
RDW: 19.2 % — ABNORMAL HIGH (ref 11.5–15.5)
WBC Count: 4.5 10*3/uL (ref 4.0–10.5)
nRBC: 0 % (ref 0.0–0.2)

## 2024-03-20 LAB — CERVICOVAGINAL ANCILLARY ONLY
Bacterial Vaginitis (gardnerella): NEGATIVE
Candida Glabrata: NEGATIVE
Candida Vaginitis: NEGATIVE
Comment: NEGATIVE
Comment: NEGATIVE
Comment: NEGATIVE

## 2024-03-20 LAB — SAMPLE TO BLOOD BANK

## 2024-03-20 LAB — LACTATE DEHYDROGENASE: LDH: 134 U/L (ref 98–192)

## 2024-03-20 MED ORDER — HEPARIN SOD (PORK) LOCK FLUSH 100 UNIT/ML IV SOLN
500.0000 [IU] | Freq: Once | INTRAVENOUS | Status: AC
  Administered 2024-03-20: 500 [IU] via INTRAVENOUS

## 2024-03-20 MED ORDER — SODIUM CHLORIDE 0.9% FLUSH
10.0000 mL | INTRAVENOUS | Status: DC | PRN
  Administered 2024-03-20: 10 mL via INTRAVENOUS

## 2024-03-20 NOTE — Progress Notes (Signed)
 This is Hematology and Oncology Follow Up Visit  Ariana Herrera [id number] [birth year] 58 y.o. 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 daily-start on 01/18/2024 Talazoparib 1 mg p.o. daily-start on 12/22/2023 -  changed to 0.50 mg po q day on 03/06/2024   Interim History:  Ms. Ariana Herrera is here today for follow-up.  We had to decrease her Talazoparib to 0.5 mg p.o. daily.  This is worked quite nicely.  Her blood counts are on the whole lot better now.  When she was last year, she also had a Streptococcus UTI.  She was put on some antibiotics which have helped.  She feels great.  She did have scans done when she was last year.  She had MRI of the brain on 03/02/2024.  This did not show any evidence of metastatic disease.  She also had a CT of the neck.  This was done on 03/05/2024.  This showed resolution of her adenopathy.  GIST very happy that she responded incredibly well to chemo and radiation therapy that we had given her initially.  She now is on Tellis Opper rib.  She is still smoking.  She is cut back on smoking.  She smokes about half pack per day.  Her last CA 27.29 was down to 19.  She now has a insulin  pump.  This is very nifty.  I am very impressed as how it works.  Again she has responded very nicely to the Talazoparib.  Her left breast is softened up quite a bit.  She has had no change in bowel or bladder habits.  She has had no leg swelling.  There is been no  rashes.  She has had no fever.  Overall, I would say that her performance status is probably ECOG 1.       Wt Readings from Last 3 Encounters:  03/20/24 160 lb (72.6 kg)  03/15/24 163 lb 12.8 oz (74.3 kg)  03/06/24 156 lb (70.8 kg)    Medications:  Allergies as of 03/20/2024       Reactions   Lithium Other (See Comments)   Spinal fluid built up in brain   Dulaglutide  Nausea And Vomiting, Other (See Comments)   TRULICITY    Nitrofuran Derivatives Itching   Nitrofurantoin  Mono- MCR   Penicillins Other (See Comments)   UNKNOWN CHILDHOOD REACTION        Medication List        Accurate as of Mar 20, 2024  1:16 PM. If you have any questions, ask your nurse or doctor.          Accu-Chek Guide test strip Generic drug: glucose blood 3 (three) times daily.   albuterol  1.25 MG/3ML nebulizer solution Commonly known as: ACCUNEB  Take 1 ampule by nebulization every 6 (six) hours as needed for wheezing or shortness of breath.   albuterol  108 (90 Base) MCG/ACT inhaler Commonly known as: VENTOLIN  HFA Inhale 2 puffs into the lungs every 6 (six) hours as needed  for wheezing or shortness of breath.   cyclobenzaprine  5 MG tablet Commonly known as: FLEXERIL  Take 5 mg by mouth 2 (two) times daily as needed for muscle spasms.   Dexcom G7 Sensor Misc Change sensor every 10 days   fenofibrate  145 MG tablet Commonly known as: TRICOR  Take 145 mg by mouth daily.   gabapentin  300 MG capsule Commonly known as: NEURONTIN  Take 1 capsule (300 mg total) by mouth 3 (three) times daily.   HYDROcodone -acetaminophen  7.5-325 MG tablet Commonly known as: NORCO Take 1 tablet by mouth every 6 (six) hours as needed for moderate pain (pain score 4-6).   hydrOXYzine 25 MG tablet Commonly known as: ATARAX Take 25 mg by mouth 4 (four) times daily.   lidocaine -prilocaine  cream Commonly known as: EMLA  APPLY TOPICALLY 1 APPLICATION AS NEEDED   linaclotide  290 MCG Caps capsule Commonly known  as: Linzess  Take 1 capsule (290 mcg total) by mouth daily before breakfast.   metFORMIN  1000 MG tablet Commonly known as: GLUCOPHAGE  Take 1,000 mg by mouth 2 (two) times daily with a meal.   metoprolol  tartrate 25 MG tablet Commonly known as: LOPRESSOR  Take 1 tablet (25 mg total) by mouth 2 (two) times daily.   miconazole 200 MG vaginal suppository Commonly known as: MICOTIN Place 1 suppository (200 mg total) vaginally at bedtime.   nitroGLYCERIN  0.4 MG SL tablet Commonly known as: NITROSTAT  Place 1 tablet (0.4 mg total) under the tongue every 5 (five) minutes as needed for chest pain.   OLANZapine  10 MG tablet Commonly known as: ZYPREXA  TAKE 1 TABLET BY MOUTH EVERYDAY AT BEDTIME   omeprazole  40 MG capsule Commonly known as: PRILOSEC Take 1 capsule (40 mg total) by mouth in the morning and at bedtime.   Omnipod 5 DexG7G6 Pods Gen 5 Misc Apply one pod every 3 days to administer insulin  continuously.   ondansetron  4 MG disintegrating tablet Commonly known as: ZOFRAN -ODT Take 1 tablet (4 mg total) by mouth every 8 (eight) hours.   ondansetron  8 MG tablet Commonly known as: ZOFRAN  Take 1 tablet (8 mg total) by mouth every 8 (eight) hours as needed for nausea or vomiting. START ON THE THIRD DAY AFTER CHEMOTHERAPY.   oxybutynin  5 MG 24 hr tablet Commonly known as: DITROPAN -XL TAKE 1 TABLET BY MOUTH EVERYDAY AT BEDTIME   PEG 3350  17 g Pack Take 17 g by mouth daily as needed.   prochlorperazine  10 MG tablet Commonly known as: COMPAZINE  Take 1 tablet (10 mg total) by mouth every 6 (six) hours as needed for nausea or vomiting (Zofran  resumes 12/16).   QUEtiapine  400 MG tablet Commonly known as: SEROQUEL  TAKE 2 TABLETS (800 MG TOTAL) BY MOUTH AT BEDTIME.   rivaroxaban  10 MG Tabs tablet Commonly known as: XARELTO  Take 1 tablet (10 mg total) by mouth daily.   rosuvastatin  40 MG tablet Commonly known as: CRESTOR  Take 1 tablet (40 mg total) by mouth daily.   senna 8.6 MG  Tabs tablet Commonly known as: SENOKOT Take 1 tablet (8.6 mg total) by mouth 2 (two) times daily.   talazoparib tosylate  0.5 MG capsule Commonly known as: Talzenna  Take 1 capsule (0.5 mg total) by mouth daily.        Allergies:  Allergies  Allergen Reactions   Lithium Other (See Comments)    Spinal fluid built up in brain   Dulaglutide  Nausea And Vomiting and Other (See Comments)    TRULICITY    Nitrofuran Derivatives Itching    Nitrofurantoin  Mono- MCR   Penicillins  Other (See Comments)    UNKNOWN CHILDHOOD REACTION    Past Medical History, Surgical history, Social history, and Family History were reviewed and updated.  Review of Systems: Review of Systems  Constitutional:  Positive for malaise/fatigue.  HENT: Negative.    Eyes: Negative.   Respiratory: Negative.    Cardiovascular:  Negative for leg swelling.  Gastrointestinal:  Positive for nausea.  Genitourinary: Negative.   Musculoskeletal:  Positive for joint pain and myalgias.  Skin: Negative.   Neurological:  Positive for tingling.  Endo/Heme/Allergies: Negative.   Psychiatric/Behavioral: Negative.       Physical Exam:  height is 5\' 2"  (1.575 m) and weight is 160 lb (72.6 kg). Her oral temperature is 98 F (36.7 C). Her blood pressure is 117/55 (abnormal) and her pulse is 93. Her respiration is 16 and oxygen saturation is 100%.   Wt Readings from Last 3 Encounters:  03/20/24 160 lb (72.6 kg)  03/15/24 163 lb 12.8 oz (74.3 kg)  03/06/24 156 lb (70.8 kg)    Physical Exam Vitals reviewed.  HENT:     Head: Normocephalic and atraumatic.     Comments: She does not have any swelling now.  I do not feel any firmness on the left side of her neck.  No adenopathy is noted.  She has no intraoral lesions.  Eyes:     Pupils: Pupils are equal, round, and reactive to light.  Cardiovascular:     Rate and Rhythm: Normal rate and regular rhythm.     Heart sounds: Normal heart sounds.  Pulmonary:     Effort:  Pulmonary effort is normal.     Breath sounds: Normal breath sounds.  Abdominal:     General: Bowel sounds are normal. There is no distension.     Palpations: Abdomen is soft.     Tenderness: There is no abdominal tenderness. There is no guarding.  Musculoskeletal:        General: No deformity. Normal range of motion.     Cervical back: Normal range of motion.  Lymphadenopathy:     Cervical: No cervical adenopathy.  Skin:    General: Skin is warm and dry.     Coloration: Skin is not jaundiced or pale.     Findings: No bruising, erythema or rash.  Neurological:     Mental Status: She is alert and oriented to person, place, and time.  Psychiatric:        Behavior: Behavior normal.        Thought Content: Thought content normal.        Judgment: Judgment normal.     Lab Results  Component Value Date   WBC 4.5 03/20/2024   HGB 8.9 (L) 03/20/2024   HCT 26.8 (L) 03/20/2024   MCV 103.1 (H) 03/20/2024   PLT 119 (L) 03/20/2024   Lab Results  Component Value Date   FERRITIN 100 03/06/2024   IRON 82 03/06/2024   TIBC 314 03/06/2024   UIBC 232 03/06/2024   IRONPCTSAT 26 03/06/2024   Lab Results  Component Value Date   RETICCTPCT 3.5 (H) 03/06/2024   RBC 2.60 (L) 03/20/2024   No results found for: "KPAFRELGTCHN", "LAMBDASER", "KAPLAMBRATIO" No results found for: "IGGSERUM", "IGA", "IGMSERUM" No results found for: "TOTALPROTELP", "ALBUMINELP", "A1GS", "A2GS", "BETS", "BETA2SER", "GAMS", "MSPIKE", "SPEI"   Chemistry      Component Value Date/Time   NA 135 03/20/2024 1205   K 3.8 03/20/2024 1205   CL 102 03/20/2024 1205   CO2 24 03/20/2024 1205  BUN 18 03/20/2024 1205   CREATININE 0.86 03/20/2024 1205      Component Value Date/Time   CALCIUM  9.4 03/20/2024 1205   ALKPHOS 80 03/20/2024 1205   AST 18 03/20/2024 1205   ALT 12 03/20/2024 1205   BILITOT 0.3 03/20/2024 1205         Impression and Plan: Ms. Ureste is a very pleasant  58 yo caucasian female with  recurrent ductal carcinoma of the left breast.  It is amazing that the recurrence was just been in the neck.  She is treated with incredibly aggressive chemo radiation therapy.  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  Again this worked very very nicely.  She now is on talus operative.  I am very happy that she is on this and doing well.  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  I want to keep her on the Xarelto .  I really think this is going to be helpful.  Will try to move her appointments out a bit longer now.  I will try to get her back the first week in July.  I want to see her back before the July 4 holiday.   Ivor Mars, MD 5/27/20251:16 PM

## 2024-03-20 NOTE — Patient Instructions (Signed)

## 2024-03-22 ENCOUNTER — Other Ambulatory Visit: Payer: Self-pay | Admitting: Physician Assistant

## 2024-03-22 ENCOUNTER — Other Ambulatory Visit

## 2024-03-22 ENCOUNTER — Other Ambulatory Visit: Payer: Self-pay

## 2024-03-22 ENCOUNTER — Other Ambulatory Visit: Payer: Self-pay | Admitting: Emergency Medicine

## 2024-03-22 DIAGNOSIS — C50012 Malignant neoplasm of nipple and areola, left female breast: Secondary | ICD-10-CM

## 2024-03-22 DIAGNOSIS — R3915 Urgency of urination: Secondary | ICD-10-CM

## 2024-03-22 DIAGNOSIS — I82C19 Acute embolism and thrombosis of unspecified internal jugular vein: Secondary | ICD-10-CM

## 2024-03-22 DIAGNOSIS — G620 Drug-induced polyneuropathy: Secondary | ICD-10-CM

## 2024-03-22 DIAGNOSIS — N3000 Acute cystitis without hematuria: Secondary | ICD-10-CM

## 2024-03-23 ENCOUNTER — Telehealth: Payer: Self-pay | Admitting: Physician Assistant

## 2024-03-23 NOTE — Telephone Encounter (Signed)
 [facility] notifying Ariana Herrera that we never received any type of request for shower bench.

## 2024-03-23 NOTE — Telephone Encounter (Signed)
 Copied from CRM [id number]. Topic: Clinical - Order For Equipment >> Mar 23, 2024  1:53 PM Crispin Dolphin wrote: Reason for CRM: Kathaleen Pale Nurse Manager with [facility] called to check status of shower bench that was requested via fax 5/9. Thank You

## 2024-03-26 NOTE — Telephone Encounter (Signed)
 LMOM with Kathaleen Pale for preferred DME provider for shower bench.

## 2024-03-26 NOTE — Telephone Encounter (Signed)
 Copied from CRM [id number]. Topic: Clinical - Order For Equipment >> Mar 26, 2024  3:46 PM Adonis Hoot wrote: Reason for CRM: Kathaleen Pale from [facility] care called In and would like to know if an DME  order for a shower bench could be sent out to a DME provider for patient.She stated that the patient has trouble with balance.  Contact#:[phone]

## 2024-03-27 NOTE — Telephone Encounter (Signed)
 Copied from CRM [id number]. Topic: Clinical - Order For Equipment >> Mar 26, 2024  3:46 PM Adonis Hoot wrote: Reason for CRM: Kathaleen Pale from [facility] care called In and would like to know if an DME  order for a shower bench could be sent out to a DME provider for patient.She stated that the patient has trouble with balance.  Contact#:[phone] >> Mar 27, 2024 11:02 AM Clyde Darling P wrote: Chrissy advise there is no preference. State you can try [facility] delivery for shower bench

## 2024-03-28 ENCOUNTER — Other Ambulatory Visit: Payer: Self-pay | Admitting: Physician Assistant

## 2024-03-28 DIAGNOSIS — R531 Weakness: Secondary | ICD-10-CM

## 2024-03-28 DIAGNOSIS — C50012 Malignant neoplasm of nipple and areola, left female breast: Secondary | ICD-10-CM

## 2024-03-29 NOTE — Progress Notes (Signed)
 Cardiology Office Note   Date:  04/05/2024  ID:  [name], DOB 1966-02-24, MRN [id number] PCP: Trenton Frock, PA-C  [facility] Providers Cardiologist:  Hazle Lites, MD    History of Present Illness Ariana Herrera is a 58 y.o. female with a past medical history of CAD, type 2 DM, HLD, asthma, breast cancer. She is followed by Dr. [name] and presents today for a 6 month follow up appointment    Per chart review patient previously underwent echocardiogram on 08/21/20 that showed EF 60-65%, no regional wall motion abnormalities, normal RV function. Underwent LHC on 08/22/20 that showed mild nonobstructive CAD. She was managed medically with lipitor, metoprolol , losartan , fenofibrate .   Patient was last seen by Dr. [name] on 07/30/22. At that time, patient was doing well from a cardiac perspective. No changes were made to her medications. I saw her in clinic on 09/20/23. At that time, she reported occasional episodes of chest pain once every few months, often during times of significant emotional stress. Discomfort was very brief and resolved on its own. As symptoms were very infrequent, were mild, and resolved on their own, we decided not to pursue ischemic evaluation. She remained on pravastatin , fenofibrate . Started metoprolol  tartrate for occasional tachycardia.   She is followed by oncology for breast cancer. Developed thrombocytopenia with platelets as low as 16 in 02/2024. Her xarelto  and talazoparib were held. 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 Denies syncope. Reports that if she stands up too quickly, she becomes dizzy and feels like she might pass out. If she sits down, symptoms resolve. She has also noticed that her BP has been running lower at home. She reports infrequent episodes of  sharp, left sided chest pain. Pain occurs at rest, only lasts a few minutes, then resolves on its own. Occurs about 1-2 times per month. She does not have any chest pain on exertion. Denies DOE. Denies lower extremity swelling, palpitations. She is followed by Oncology for breast cancer and is on chemotherapy. She recently had issues with her platelets being too low, but they recovered. Dr. [name] with oncology has been monitoring this   Studies Reviewed  Cardiac Studies & Procedures   ______________________________________________________________________________________________ CARDIAC CATHETERIZATION  CARDIAC CATHETERIZATION 08/22/2020  Conclusion  Prox RCA lesion is 30% stenosed.  Mid RCA lesion is 40% stenosed.  Mid RCA to Dist RCA lesion is 20% stenosed.  Ost LAD to Prox LAD lesion is 20% stenosed.  1. Mild non-obstructive CAD 2. Elevated left filling pressures.  Recommendations: Medical management of mild CAD  Findings Coronary Findings Diagnostic  Dominance: Right  Left Anterior Descending Vessel is large. Ost LAD to Prox LAD lesion is 20% stenosed.  Left Circumflex Vessel is large.  Right Coronary Artery Vessel is large. Prox RCA lesion is 30% stenosed. Mid RCA lesion is 40% stenosed. Mid RCA to Dist RCA lesion is 20% stenosed.  Intervention  No interventions have been documented.     ECHOCARDIOGRAM  ECHOCARDIOGRAM COMPLETE 08/21/2020  Narrative ECHOCARDIOGRAM REPORT    Patient Name:   Ariana Herrera Date of Exam: 08/21/2020 Medical Rec #:  [id number]     Height:       62.0 in Accession #:    [id number]    Weight:       160.2 lb Date of Birth:  27-May-1966    BSA:          1.740 m Patient Age:    53 years      BP:           125/70 mmHg Patient Gender: F             HR:           90 bpm. Exam Location:  Inpatient  Procedure: 2D Echo, Color Doppler and Cardiac Doppler  Indications:    NSTEMI  History:        Patient has no prior history of  Echocardiogram examinations. Risk Factors:Diabetes.  Sonographer:    Sherline Distel Senior RDCS Referring Phys: LAURA R INGOLD  IMPRESSIONS   1. Left ventricular ejection fraction, by estimation, is 60 to 65%. The left ventricle has normal function. The left ventricle has no regional wall motion abnormalities. Left ventricular diastolic parameters were normal. 2. Right ventricular systolic function is normal. The right ventricular size is normal. Tricuspid regurgitation signal is inadequate for assessing PA pressure. 3. The mitral valve is normal in structure. Trivial mitral valve regurgitation. No evidence of mitral stenosis. 4. The aortic valve is tricuspid. Aortic valve regurgitation is not visualized. No aortic stenosis is present. 5. The inferior vena cava is normal in size with greater than 50% respiratory variability, suggesting right atrial pressure of 3 mmHg.  FINDINGS Left Ventricle: Left ventricular ejection fraction, by estimation, is 60 to 65%. The left ventricle has normal function. The left ventricle has no regional wall motion abnormalities. The left ventricular internal cavity size was normal in size. There is no left ventricular hypertrophy. Left ventricular diastolic parameters were normal.  Right Ventricle: The right ventricular size is normal. No increase in right ventricular wall thickness. Right ventricular systolic function is normal. Tricuspid regurgitation signal is inadequate for assessing PA pressure.  Left Atrium: Left atrial size was normal in size.  Right Atrium: Right atrial size was normal in size.  Pericardium: There is no evidence of pericardial effusion.  Mitral Valve: The mitral valve is normal in structure. Trivial mitral valve regurgitation. No evidence of mitral valve stenosis.  Tricuspid Valve: The tricuspid valve is normal in structure. Tricuspid valve regurgitation is not demonstrated.  Aortic Valve: The aortic valve is tricuspid. Aortic valve  regurgitation is not visualized. No aortic stenosis is present.  Pulmonic Valve: The pulmonic valve was normal in structure. Pulmonic valve regurgitation is not visualized.  Aorta: The aortic root is normal in size and structure.  Venous: The inferior vena cava is normal in size with greater than 50% respiratory variability, suggesting right atrial pressure of 3 mmHg.  IAS/Shunts: No atrial level shunt detected by color flow Doppler.   LEFT VENTRICLE PLAX 2D LVIDd:         4.00 cm  Diastology LVIDs:         2.10 cm  LV e' medial:    8.16 cm/s LV PW:         0.60 cm  LV E/e' medial:  9.9 LV IVS:        0.90 cm  LV e' lateral:   9.03 cm/s LVOT diam:     2.00 cm  LV E/e' lateral: 9.0 LV SV:         62 LV SV Index:   36 LVOT Area:     3.14 cm   RIGHT VENTRICLE RV S prime:     9.57 cm/s TAPSE (M-mode): 1.7 cm  LEFT ATRIUM  Index       RIGHT ATRIUM           Index LA diam:        2.50 cm 1.44 cm/m  RA Area:     11.90 cm LA Vol (A2C):   38.1 ml 21.90 ml/m RA Volume:   26.80 ml  15.41 ml/m LA Vol (A4C):   28.3 ml 16.27 ml/m LA Biplane Vol: 32.8 ml 18.85 ml/m AORTIC VALVE LVOT Vmax:   102.00 cm/s LVOT Vmean:  75.900 cm/s LVOT VTI:    0.197 m  AORTA Ao Root diam: 3.00 cm Ao Asc diam:  3.20 cm  MITRAL VALVE MV Area (PHT): 3.21 cm    SHUNTS MV Decel Time: 236 msec    Systemic VTI:  0.20 m MV E velocity: 81.00 cm/s  Systemic Diam: 2.00 cm MV A velocity: 68.60 cm/s MV E/A ratio:  1.18  Peder Bourdon MD Electronically signed by Peder Bourdon MD Signature Date/Time: 08/21/2020/2:50:07 PM    Final      CT SCANS  CT CARDIAC SCORING (SELF PAY ONLY) 08/21/2020  Addendum 08/21/2020  6:22 PM ADDENDUM REPORT: 08/21/2020 18:20  EXAM: OVER-READ INTERPRETATION  CT CHEST  The following report is an over-read performed by radiologist Dr. Pearlean Botts [facility] on 08/21/2020. This over-read does not include interpretation of cardiac or  coronary anatomy or pathology. The calcium  score interpretation by the cardiologist is attached.  COMPARISON:  CT chest 08/20/2020  FINDINGS: Limited view of the lung parenchyma demonstrates no suspicious nodularity. Airways are normal.  Limited view of the mediastinum demonstrates no adenopathy. Esophagus normal.  Limited view of the upper abdomen unremarkable.  Limited view of the skeleton and chest wall is unremarkable.  IMPRESSION: No significant extracardiac findings.   Electronically Signed By: Deboraha Fallow M.D. On: 08/21/2020 18:20  Narrative CLINICAL DATA:  Risk stratification  EXAM: Coronary Calcium  Score  TECHNIQUE: The patient was scanned on a [employer]. Axial non-contrast 3 mm slices were carried out through the heart. The data set was analyzed on a dedicated work station and scored using the Agatson method.  FINDINGS: Non-cardiac: See separate report from [facility].  Ascending Aorta: Normal caliber.  Atherosclerosis.  Pericardium: Normal  Coronary arteries: Normal origins.  Multivessel CAC.  IMPRESSION: Coronary calcium  score of 35. This was 94th percentile for age and sex matched control.  Electronically Signed: By: Hazle Lites M.D. On: 08/21/2020 16:27     ______________________________________________________________________________________________       Risk Assessment/Calculations           Physical Exam VS:  BP (!) 102/59   Pulse 99   Ht 5' 2 (1.575 m)   Wt 150 lb (68 kg)   LMP 10/04/2016 Comment: after procedure to stop bleeding  SpO2 96%   BMI 27.44 kg/m    Wt Readings from Last 3 Encounters:  04/05/24 150 lb (68 kg)  04/04/24 151 lb 3.2 oz (68.6 kg)  03/20/24 160 lb (72.6 kg)    GEN: Well nourished, well developed in no acute distress. Sitting comfortably on the exam table  NECK: No JVD  CARDIAC:  RRR, no murmurs, rubs, gallops. Radial pulses 2+ bilaterally  RESPIRATORY:  Clear to  auscultation without rales, wheezing or rhonchi. Normal WOB on room air.  ABDOMEN: Soft, non-tender, non-distended EXTREMITIES:  No edema; No deformity   ASSESSMENT AND PLAN  Nonobstructive CAD  Chest pain  - Patient underwent cardiac catheterization in 07/2020 that showed mild nonobstructive CAD  -  Patient reports that she has been having 1-2 episodes chest pains per month.  Episodes are brief and only last a few minutes before resolving on their own.  Always occur at rest.  Denies chest pain on exertion. -Discussed that her symptoms are somewhat atypical as her pain occurs at rest and is not associated with exertion.  Her symptoms are infrequent only happening 1-2 times per month.  Through joint decision-making, we decided to hold off on stress testing at this time - Continue statin therapy for risk reduction  - No ASA as patient is on xarelto     HLD  - Lipid panel from 08/2023 showed LDL 95, HDL 37, triglycerides 499, total cholesterol 215. She was transitioned from pravstatin to crestor  at that time  - Continue crestor  40 mg daily  - Continue fenofibrate  145 mg daily  - Ordered lipid panel for monitoring. ALT 12 on 03/20/24    Orthostatic hypotension  Sinus Tachycardia  - When seen in 08/2023, EKG showed sinus tachycardia with HR 115 BPM. She reported that her HR and BP had both been a bit elevated at her past few appointments. Started metoprolol  tartrate 25 mg BID  - Now, patient reports that she has been having episodes of dizziness upon standing and that her blood pressure has been soft at home.  Orthostatic vital signs in the office today showed BP 111/70 laying, 91/57 sitting, 111/68 standing.  She developed dizziness when standing -Suspect patient's comorbid conditions and medical therapies (breast cancer, tobacco use, diabetes, anemia) are contributing to her orthostatic hypotension and dizziness  -Stop metoprolol  tartrate.  Instead start metoprolol  succinate 12.5 mg daily.   Suspect her heart rate will raise a bit with this change.  She does not have any palpitations or symptoms when heart rate elevated. Her dizziness upon standing is affecting her more than her tachycardia.  - Instructed patient to start wearing compression stockings and increase hydration.  Also advised her to increase protein intake - Provided patient with BP/heart rate log with instructions to check vitals once in the morning and once at night -Arrange close follow-up in 2-3 weeks to review blood pressure/heart rate log and ensure symptoms are improving with above changes  Type 2 DM  - Managed by PCP  - A1c 9.3 in 04/2023  - Continue metformin , insulin     Tobacco Use   - Counseled on tobacco cessation  Breast Cancer  - Followed  by oncology    Dispo: Follow up in 4-6 weeks   Signed, Debria Fang, PA-C

## 2024-03-30 ENCOUNTER — Other Ambulatory Visit (HOSPITAL_BASED_OUTPATIENT_CLINIC_OR_DEPARTMENT_OTHER): Payer: Self-pay

## 2024-03-30 ENCOUNTER — Ambulatory Visit: Payer: Self-pay | Admitting: Physician Assistant

## 2024-03-30 DIAGNOSIS — N3000 Acute cystitis without hematuria: Secondary | ICD-10-CM

## 2024-03-30 LAB — SUSCEPTIBILITY PANEL,AEROBIC BACTERIUM
Micro Number:: 16537556
Specimen Quality:: ADEQUATE

## 2024-03-30 LAB — URINE CULTURE
MICRO NUMBER:: 16514122
SPECIMEN QUALITY:: ADEQUATE

## 2024-03-30 MED ORDER — CEPHALEXIN 500 MG PO CAPS
500.0000 mg | ORAL_CAPSULE | Freq: Two times a day (BID) | ORAL | 0 refills | Status: DC
  Filled 2024-03-30: qty 20, 10d supply, fill #0

## 2024-04-02 ENCOUNTER — Other Ambulatory Visit (HOSPITAL_BASED_OUTPATIENT_CLINIC_OR_DEPARTMENT_OTHER): Payer: Self-pay

## 2024-04-02 MED ORDER — DEXCOM G7 SENSOR MISC
1.0000 | 0 refills | Status: DC
  Filled 2024-04-02: qty 3, 30d supply, fill #0

## 2024-04-03 NOTE — Progress Notes (Unsigned)
 Established patient visit   Patient: Ariana Herrera   DOB: 1965-11-08   58 y.o. Female  MRN: [id number] Visit Date: 04/04/2024  Today's healthcare provider: Trenton Frock, PA-C  Cc. Persistent dizziness  Subjective     Discussed the use of AI scribe software for clinical note transcription with the patient, who gave verbal consent to proceed.  History of Present Illness   Ariana Herrera is a 58 year old female who presents with dizziness.  She experiences dizziness without loss of consciousness, triggered by standing or moving, necessitating immediate sitting. No dizziness occurs when lying or sitting. Denies vertigo-like room spinning dizziness. She has lost approximately nine pounds in two weeks despite regular eating. Swelling noted a couple of weeks ago has improved. Her blood pressure has been trending lower over the past six months. She recently acquired a new blood pressure machine for monitoring. She is trying to stay hydrated by drinking water regularly. She smokes.       Medications: Outpatient Medications Prior to Visit  Medication Sig   ACCU-CHEK GUIDE test strip 3 (three) times daily.   albuterol  (ACCUNEB ) 1.25 MG/3ML nebulizer solution Take 1 ampule by nebulization every 6 (six) hours as needed for wheezing or shortness of breath.   albuterol  (VENTOLIN  HFA) 108 (90 Base) MCG/ACT inhaler Inhale 2 puffs into the lungs every 6 (six) hours as needed for wheezing or shortness of breath.   cephALEXin  (KEFLEX ) 500 MG capsule Take 1 capsule (500 mg total) by mouth 2 (two) times daily.   Continuous Glucose Sensor (DEXCOM G7 SENSOR) MISC Change sensor every 10 days   Continuous Glucose Sensor (DEXCOM G7 SENSOR) MISC Change sensor every 10 days.   cyclobenzaprine  (FLEXERIL ) 5 MG tablet Take 5 mg by mouth 2 (two) times daily as needed for muscle spasms.   fenofibrate  (TRICOR ) 145 MG tablet Take 145 mg by mouth daily.   gabapentin  (NEURONTIN ) 300 MG capsule Take  1 capsule (300 mg total) by mouth 3 (three) times daily.   HYDROcodone -acetaminophen  (NORCO) 7.5-325 MG tablet Take 1 tablet by mouth every 6 (six) hours as needed for moderate pain (pain score 4-6).   hydrOXYzine (ATARAX) 25 MG tablet Take 25 mg by mouth 4 (four) times daily.   Insulin  Disposable Pump (OMNIPOD 5 DEXG7G6 PODS GEN 5) MISC Apply one pod every 3 days to administer insulin  continuously.   lidocaine -prilocaine  (EMLA ) cream APPLY TOPICALLY 1 APPLICATION AS NEEDED   linaclotide  (LINZESS ) 290 MCG CAPS capsule Take 1 capsule (290 mcg total) by mouth daily before breakfast.   metFORMIN  (GLUCOPHAGE ) 1000 MG tablet Take 1,000 mg by mouth 2 (two) times daily with a meal.   metoprolol  tartrate (LOPRESSOR ) 25 MG tablet Take 1 tablet (25 mg total) by mouth 2 (two) times daily.   miconazole  (MICOTIN) 200 MG vaginal suppository Place 1 suppository (200 mg total) vaginally at bedtime.   nitroGLYCERIN  (NITROSTAT ) 0.4 MG SL tablet Place 1 tablet (0.4 mg total) under the tongue every 5 (five) minutes as needed for chest pain. (Patient not taking: Reported on 01/18/2024)   OLANZapine  (ZYPREXA ) 10 MG tablet TAKE 1 TABLET BY MOUTH EVERYDAY AT BEDTIME   omeprazole  (PRILOSEC) 40 MG capsule Take 1 capsule (40 mg total) by mouth in the morning and at bedtime.   ondansetron  (ZOFRAN ) 8 MG tablet Take 1 tablet (8 mg total) by mouth every 8 (eight) hours as needed for nausea or vomiting. START ON THE THIRD DAY AFTER CHEMOTHERAPY.   ondansetron  (ZOFRAN -ODT)  4 MG disintegrating tablet Take 1 tablet (4 mg total) by mouth every 8 (eight) hours.   oxybutynin  (DITROPAN -XL) 5 MG 24 hr tablet TAKE 1 TABLET BY MOUTH EVERYDAY AT BEDTIME   polyethylene glycol (MIRALAX  / GLYCOLAX ) 17 g packet Take 17 g by mouth daily as needed. (Patient not taking: Reported on 03/06/2024)   prochlorperazine  (COMPAZINE ) 10 MG tablet Take 1 tablet (10 mg total) by mouth every 6 (six) hours as needed for nausea or vomiting (Zofran  resumes 12/16).  (Patient not taking: Reported on 03/06/2024)   QUEtiapine  (SEROQUEL ) 400 MG tablet TAKE 2 TABLETS (800 MG TOTAL) BY MOUTH AT BEDTIME.   rivaroxaban  (XARELTO ) 10 MG TABS tablet Take 1 tablet (10 mg total) by mouth daily.   rosuvastatin  (CRESTOR ) 40 MG tablet Take 1 tablet (40 mg total) by mouth daily.   senna (SENOKOT) 8.6 MG TABS tablet Take 1 tablet (8.6 mg total) by mouth 2 (two) times daily.   talazoparib tosylate  (TALZENNA ) 0.5 MG capsule Take 1 capsule (0.5 mg total) by mouth daily.   Facility-Administered Medications Prior to Visit  Medication Dose Route Frequency Provider   sodium chloride  flush (NS) 0.9 % injection 10 mL  10 mL Intravenous PRN Ivor Mars, MD    Review of Systems  Constitutional:  Negative for fatigue and fever.  Respiratory:  Negative for cough and shortness of breath.   Cardiovascular:  Negative for chest pain and leg swelling.  Gastrointestinal:  Negative for abdominal pain.  Neurological:  Positive for dizziness and light-headedness.       Objective    BP 100/64   Pulse 99   Ht 5' 2 (1.575 m)   Wt 151 lb 3.2 oz (68.6 kg)   LMP 10/04/2016 Comment: after procedure to stop bleeding  BMI 27.65 kg/m    Physical Exam Constitutional:      General: She is awake.     Appearance: She is well-developed.  HENT:     Head: Normocephalic.  Eyes:     Conjunctiva/sclera: Conjunctivae normal.  Cardiovascular:     Rate and Rhythm: Normal rate and regular rhythm.     Heart sounds: Normal heart sounds.  Pulmonary:     Effort: Pulmonary effort is normal.     Breath sounds: Normal breath sounds.  Skin:    General: Skin is warm.  Neurological:     Mental Status: She is alert and oriented to person, place, and time.  Psychiatric:        Attention and Perception: Attention normal.        Mood and Affect: Mood normal.        Speech: Speech normal.        Behavior: Behavior is cooperative.     No results found for any visits on 04/04/24.  Assessment &  Plan    Dizziness   Dizziness upon standing suggests orthostatic hypotension. Blood pressure has trended lower , currently at 100/64 mmHg.  Dizziness could also be multifactorial, Contributing factors include smoking, chemotherapy, reduced activity, anemia, low plt count.  Pt sees cardio tomorrow, suggesting discussing w/ them , she may need a hypotensive agent.  Cannot discount ongoing + urine culture, if symptoms persist despite any treatment, would recommend re-culture w/ potential referral to urology. - Ensure adequate hydration   Return if symptoms worsen or fail to improve.       Trenton Frock, PA-C  [facility] at [facility] [phone] (phone) [phone] (fax)  [facility]

## 2024-04-04 ENCOUNTER — Ambulatory Visit (INDEPENDENT_AMBULATORY_CARE_PROVIDER_SITE_OTHER): Admitting: Physician Assistant

## 2024-04-04 ENCOUNTER — Encounter: Payer: Self-pay | Admitting: Physician Assistant

## 2024-04-04 VITALS — BP 100/64 | HR 99 | Ht 62.0 in | Wt 151.2 lb

## 2024-04-04 DIAGNOSIS — R42 Dizziness and giddiness: Secondary | ICD-10-CM | POA: Diagnosis not present

## 2024-04-05 ENCOUNTER — Ambulatory Visit: Attending: Cardiology | Admitting: Cardiology

## 2024-04-05 ENCOUNTER — Encounter: Payer: Self-pay | Admitting: Cardiology

## 2024-04-05 VITALS — BP 102/59 | HR 99 | Ht 62.0 in | Wt 150.0 lb

## 2024-04-05 DIAGNOSIS — R000 Tachycardia, unspecified: Secondary | ICD-10-CM | POA: Insufficient documentation

## 2024-04-05 DIAGNOSIS — I951 Orthostatic hypotension: Secondary | ICD-10-CM | POA: Insufficient documentation

## 2024-04-05 DIAGNOSIS — I10 Essential (primary) hypertension: Secondary | ICD-10-CM | POA: Insufficient documentation

## 2024-04-05 DIAGNOSIS — Z794 Long term (current) use of insulin: Secondary | ICD-10-CM | POA: Insufficient documentation

## 2024-04-05 DIAGNOSIS — E1169 Type 2 diabetes mellitus with other specified complication: Secondary | ICD-10-CM | POA: Diagnosis present

## 2024-04-05 DIAGNOSIS — I2510 Atherosclerotic heart disease of native coronary artery without angina pectoris: Secondary | ICD-10-CM | POA: Diagnosis present

## 2024-04-05 DIAGNOSIS — E1165 Type 2 diabetes mellitus with hyperglycemia: Secondary | ICD-10-CM | POA: Diagnosis present

## 2024-04-05 DIAGNOSIS — E785 Hyperlipidemia, unspecified: Secondary | ICD-10-CM | POA: Insufficient documentation

## 2024-04-05 MED ORDER — METOPROLOL SUCCINATE ER 25 MG PO TB24: 12.5000 mg | ORAL_TABLET | Freq: Every day | ORAL | 3 refills | Status: DC

## 2024-04-05 NOTE — Patient Instructions (Addendum)
Medication Instructions:  Stop Metoprolol  tartrate Start Metoprolol  Succinate 25 mg once a day  *If you need a refill on your cardiac medications before your next appointment, please call your pharmacy*  Lab Work: Today we are going to draw fasting lipid panel If you have labs (blood work) drawn today and your tests are completely normal, you will receive your results only by: MyChart Message (if you have MyChart) OR A paper copy in the mail If you have any lab test that is abnormal or we need to change your treatment, we will call you to review the results.  Testing/Procedures: We are going have you take blood pressure once in the morning after taking you AM meds and once in the afternoon an hour after PM medications.  Follow-Up: At [facility], you and your health needs are our priority.  As part of our continuing mission to provide you with exceptional heart care, our providers are all part of one team.  This team includes your primary Cardiologist (physician) and Advanced Practice Providers or APPs (Physician Assistants and Nurse Practitioners) who all work together to provide you with the care you need, when you need it.  Your next appointment:   4-6 week(s)  Provider:   Liane Redman, PA-C  We recommend signing up for the patient portal called MyChart.  Sign up information is provided on this After Visit Summary.  MyChart is used to connect with patients for Virtual Visits (Telemedicine).  Patients are able to view lab/test results, encounter notes, upcoming appointments, etc.  Non-urgent messages can be sent to your provider as well.   To learn more about what you can do with MyChart, go to [email or web address].   Other Instructions [employer]  Outlet Store  Training and development officer for [employer] Mailing Address:  [address];   [address]  [place], [place] [address]  Tel [phone] Fx [phone]     High Quality Legwear for  Today's Active Lifestyles Maximum Compression at the ankle. Compression lessens gradually up the leg.   We manufacture a wide range of compression hosiery for men and women in  different styles, constructions and levels of support.  How Compression Hosiery Works Regulatory affairs officer, [employer]. compression hosiery works by applying graduated pressure to the  muscles and veins in the legs.  When the calf muscle contracts such as during walking  the compression hosiery will "give" and then return to its original position. By doing so  the hosiery is assists your body's circulatory wellness.  The result is increased leg health and vitality.   Maximum Compression at the ankle Compression lessens gradually up the leg  We Offer: Sheer & Opaque Stockings       COLORS:  Nude, black, white and misc. prints Below Knee Thigh High Pantyhose  High Quality Legwear for Today's Active Lifestyles We manufacture a wide range of compression hosiery for men and women in different styles, construction sand levels of support.  Socks:                     Sheer & Opaque   Compression Levels Include:                                  Stockings Men's               Below Knee  8-15 mmHg   Women's         Thigh High                 15-20 mmHg  Unisex             Pantyhose                 20-30 mmHg                                                                      30-40 mmHg  4 Simple Ways to Order   Email  [email or web address] Mail/Email orders are subject to processing and handling charges. Allow 7-10 days for receipt.  Phone [phone]  Please allow 24 hours for return call.   In Person  We recommend calling prior to your visit to confirm store hours as they may change due to holiday, weather, and maintenance.   By Mail When placing an order, please have the following information available. Our representatives are available to assist.     Measurements    THIGH      in.   CALF        in.    ANKLE     in.    Compression  8-15 mmHg 15-20 mmHg   20-30 mmHg 30-40 mmHg   WOMEN'S MEN'S  Shoe Size Sock Size Shoe Size Sock Size  4 - 5 Small 7.5 and Under Small  5.5 - 7.5 Medium 8 - 10 Medium  8 - 10 Large 10.5 - 12 Large  10.5 and Over X-Large 12.5 and Over X-Large   Knee High Size Chart  Length from CALF MEASUREMENT  floor to bend   in knee. 11 12 13 14 15 16 17 18 19 20  21" 22"  14 S S S S M M L L L XL XL XL  15 S S S M M L L L XL XL XXL XXL  16 S S M M M L L L XL XXL XXL XXL  17 S M M M M L L XL XL XXL XXL XXL  18 M M M M L L L XL XL XXL XXL XXL  19 M M M M L L XL XL XL XXL XXL XXL   Thigh High Circumference Sizing Chart                 S M L XL XXL  ANKLE 6.5 - 8 8 - 9.5 9.5 - 11 11 - 12.5 12.5 - 14  CALF 10.5 - 14.5 11.5 - 15.5 12.5 - 17 13.5 - 17.5 14.5 - 19.5  THIGH 15.5 - 22 17.5 - 24 19.5 - 26 22 - 28 26 - 32  HIP UP TO 40 UP TO 44 UP TO 48' UP TO 52 UP TO 56   Pantyhose Size Chart  Height Petite Medium Tall X-Tall Queen Queen +   Weight Weight Weight Weight Weight Weight  4'11 95-130 135      5'0 95-125 130-145   170-185   5'1 90-120 125-155 160-165  170-195   5'2 90-115 120-145 150-165  170-195   5'3 90-110 115-140 145-165  170-200 200-225  5'4 100-105 110-135 140-160 165 170-200  200-225  5'5 100 105-130 135-160 165 170-200 200-225  5'6  110-125 130-155 160-165 170-200 200-225  5'7  110-120 125-150 155-165 170-200 195-225  5'8   120-145 150-165 170-200 190-225  5'9   125-140 145-170 175-190 185-220  5'10   125-135 140-185  185-215  5'11   130-135 140-185  190-210    

## 2024-04-06 ENCOUNTER — Ambulatory Visit: Payer: Self-pay | Admitting: Cardiology

## 2024-04-06 LAB — LIPID PANEL
Chol/HDL Ratio: 2.3 ratio (ref 0.0–4.4)
Cholesterol, Total: 71 mg/dL — ABNORMAL LOW (ref 100–199)
HDL: 31 mg/dL — ABNORMAL LOW (ref >39)
LDL Chol Calc (NIH): 10 mg/dL (ref 0–99)
Triglycerides: 192 mg/dL — ABNORMAL HIGH (ref 0–149)
VLDL Cholesterol Cal: 30 mg/dL (ref 5–40)

## 2024-04-13 ENCOUNTER — Other Ambulatory Visit (HOSPITAL_BASED_OUTPATIENT_CLINIC_OR_DEPARTMENT_OTHER): Payer: Self-pay

## 2024-04-13 ENCOUNTER — Other Ambulatory Visit: Payer: Self-pay | Admitting: Hematology & Oncology

## 2024-04-13 MED ORDER — HYDROCODONE-ACETAMINOPHEN 7.5-325 MG PO TABS
1.0000 | ORAL_TABLET | Freq: Four times a day (QID) | ORAL | 0 refills | Status: DC | PRN
  Filled 2024-04-13: qty 120, 30d supply, fill #0

## 2024-04-18 NOTE — Progress Notes (Signed)
 Cardiology Office Note   Date:  05/01/2024  ID:  Ariana Herrera, DOB April 13, 1966, MRN [id number] PCP: Cyndi Shaver, PA-C  [facility] Providers Cardiologist:  Vinie JAYSON Maxcy, MD   History of Present Illness Ariana Herrera is a 58 y.o. female  with a past medical history of CAD, type 2 DM, HLD, asthma, breast cancer. She is followed by Dr. Maxcy and presents today for a 6 month follow up appointment    Per chart review patient previously underwent echocardiogram on 08/21/20 that showed EF 60-65%, no regional wall motion abnormalities, normal RV function. Underwent LHC on 08/22/20 that showed mild nonobstructive CAD. She was managed medically with lipitor, metoprolol , losartan , fenofibrate .   Patient was last seen by Dr. Maxcy on 07/30/22. At that time, patient was doing well from a cardiac perspective. No changes were made to her medications. I saw her in clinic on 09/20/23. At that time, she reported occasional episodes of chest pain once every few months, often during times of significant emotional stress. Discomfort was very brief and resolved on its own. As symptoms were very infrequent, were mild, and resolved on their own, we decided not to pursue ischemic evaluation. She remained on pravastatin , fenofibrate . Started metoprolol  tartrate for occasional tachycardia.    She is followed by oncology for breast cancer. Developed thrombocytopenia with platelets as low as 16 in 02/2024. Her xarelto  and talazoparib were held. 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 At that time, patient reported having frequent episodes of dizziness upon standing. No syncope. Symptoms improved if she stood up too quickly. Denied DOE, lower extremity swelling, palpitations. In clinic, her orthostatic vital signs were positive. Transitioned from metoprolol  tartrate to metoprolol  succinate 12.5 mg  daily at bedtime. She was instructed to wear compression stockings, increase fluid and protein intake.   Patient reports that her dizziness has improved since metoprolol  dose was decreased.  However, she continues to have some dizziness upon standing.  She denies syncope or near syncope.  Reports that when she stands up, she feels lightheaded but it passes after a few moments.  She no longer has to sit in a wheelchair due to her dizziness.  She denies chest pain, shortness of breath, palpitations, lower extremity swelling.  She continues to be followed by oncology.  Has been having recurrent issues with her platelets being very low.  Currently her chemotherapy and Xarelto  are held.  She has been getting platelet infusions.  Denies any bleeding  Studies Reviewed Cardiac Studies & Procedures   ______________________________________________________________________________________________ CARDIAC CATHETERIZATION  CARDIAC CATHETERIZATION 08/22/2020  Conclusion  Prox RCA lesion is 30% stenosed.  Mid RCA lesion is 40% stenosed.  Mid RCA to Dist RCA lesion is 20% stenosed.  Ost LAD to Prox LAD lesion is 20% stenosed.  1. Mild non-obstructive CAD 2. Elevated left filling pressures.  Recommendations: Medical management of mild CAD  Findings Coronary Findings Diagnostic  Dominance: Right  Left Anterior Descending Vessel is large. Ost LAD to Prox LAD lesion is 20% stenosed.  Left Circumflex Vessel is large.  Right Coronary Artery Vessel is large. Prox RCA lesion is 30% stenosed. Mid RCA lesion is 40% stenosed. Mid RCA to Dist RCA lesion is 20% stenosed.  Intervention  No interventions have been documented.     ECHOCARDIOGRAM  ECHOCARDIOGRAM COMPLETE 08/21/2020  Narrative ECHOCARDIOGRAM REPORT    Patient Name:   Ariana  Herrera Date of Exam: 08/21/2020 Medical Rec #:  [id number]     Height:       62.0 in Accession #:    [id number]    Weight:       160.2 lb Date of Birth:   12/09/1965    BSA:          1.740 m Patient Age:    53 years      BP:           125/70 mmHg Patient Gender: F             HR:           90 bpm. Exam Location:  Inpatient  Procedure: 2D Echo, Color Doppler and Cardiac Doppler  Indications:    NSTEMI  History:        Patient has no prior history of Echocardiogram examinations. Risk Factors:Diabetes.  Sonographer:    Damien Senior RDCS Referring Phys: LAURA R INGOLD  IMPRESSIONS   1. Left ventricular ejection fraction, by estimation, is 60 to 65%. The left ventricle has normal function. The left ventricle has no regional wall motion abnormalities. Left ventricular diastolic parameters were normal. 2. Right ventricular systolic function is normal. The right ventricular size is normal. Tricuspid regurgitation signal is inadequate for assessing PA pressure. 3. The mitral valve is normal in structure. Trivial mitral valve regurgitation. No evidence of mitral stenosis. 4. The aortic valve is tricuspid. Aortic valve regurgitation is not visualized. No aortic stenosis is present. 5. The inferior vena cava is normal in size with greater than 50% respiratory variability, suggesting right atrial pressure of 3 mmHg.  FINDINGS Left Ventricle: Left ventricular ejection fraction, by estimation, is 60 to 65%. The left ventricle has normal function. The left ventricle has no regional wall motion abnormalities. The left ventricular internal cavity size was normal in size. There is no left ventricular hypertrophy. Left ventricular diastolic parameters were normal.  Right Ventricle: The right ventricular size is normal. No increase in right ventricular wall thickness. Right ventricular systolic function is normal. Tricuspid regurgitation signal is inadequate for assessing PA pressure.  Left Atrium: Left atrial size was normal in size.  Right Atrium: Right atrial size was normal in size.  Pericardium: There is no evidence of pericardial  effusion.  Mitral Valve: The mitral valve is normal in structure. Trivial mitral valve regurgitation. No evidence of mitral valve stenosis.  Tricuspid Valve: The tricuspid valve is normal in structure. Tricuspid valve regurgitation is not demonstrated.  Aortic Valve: The aortic valve is tricuspid. Aortic valve regurgitation is not visualized. No aortic stenosis is present.  Pulmonic Valve: The pulmonic valve was normal in structure. Pulmonic valve regurgitation is not visualized.  Aorta: The aortic root is normal in size and structure.  Venous: The inferior vena cava is normal in size with greater than 50% respiratory variability, suggesting right atrial pressure of 3 mmHg.  IAS/Shunts: No atrial level shunt detected by color flow Doppler.   LEFT VENTRICLE PLAX 2D LVIDd:         4.00 cm  Diastology LVIDs:         2.10 cm  LV e' medial:    8.16 cm/s LV PW:         0.60 cm  LV E/e' medial:  9.9 LV IVS:        0.90 cm  LV e' lateral:   9.03 cm/s LVOT diam:     2.00 cm  LV E/e' lateral: 9.0 LV SV:  62 LV SV Index:   36 LVOT Area:     3.14 cm   RIGHT VENTRICLE RV S prime:     9.57 cm/s TAPSE (M-mode): 1.7 cm  LEFT ATRIUM             Index       RIGHT ATRIUM           Index LA diam:        2.50 cm 1.44 cm/m  RA Area:     11.90 cm LA Vol (A2C):   38.1 ml 21.90 ml/m RA Volume:   26.80 ml  15.41 ml/m LA Vol (A4C):   28.3 ml 16.27 ml/m LA Biplane Vol: 32.8 ml 18.85 ml/m AORTIC VALVE LVOT Vmax:   102.00 cm/s LVOT Vmean:  75.900 cm/s LVOT VTI:    0.197 m  AORTA Ao Root diam: 3.00 cm Ao Asc diam:  3.20 cm  MITRAL VALVE MV Area (PHT): 3.21 cm    SHUNTS MV Decel Time: 236 msec    Systemic VTI:  0.20 m MV E velocity: 81.00 cm/s  Systemic Diam: 2.00 cm MV A velocity: 68.60 cm/s MV E/A ratio:  1.18  Ezra Shuck MD Electronically signed by Ezra Shuck MD Signature Date/Time: 08/21/2020/2:50:07 PM    Final      CT SCANS  CT CARDIAC SCORING (SELF PAY  ONLY) 08/21/2020  Addendum 08/21/2020  6:22 PM ADDENDUM REPORT: 08/21/2020 18:20  EXAM: OVER-READ INTERPRETATION  CT CHEST  The following report is an over-read performed by radiologist Dr. Jackquline Skates [facility] on 08/21/2020. This over-read does not include interpretation of cardiac or coronary anatomy or pathology. The calcium  score interpretation by the cardiologist is attached.  COMPARISON:  CT chest 08/20/2020  FINDINGS: Limited view of the lung parenchyma demonstrates no suspicious nodularity. Airways are normal.  Limited view of the mediastinum demonstrates no adenopathy. Esophagus normal.  Limited view of the upper abdomen unremarkable.  Limited view of the skeleton and chest wall is unremarkable.  IMPRESSION: No significant extracardiac findings.   Electronically Signed By: Jackquline Boxer M.D. On: 08/21/2020 18:20  Narrative CLINICAL DATA:  Risk stratification  EXAM: Coronary Calcium  Score  TECHNIQUE: The patient was scanned on a [employer]. Axial non-contrast 3 mm slices were carried out through the heart. The data set was analyzed on a dedicated work station and scored using the Agatson method.  FINDINGS: Non-cardiac: See separate report from [facility].  Ascending Aorta: Normal caliber.  Atherosclerosis.  Pericardium: Normal  Coronary arteries: Normal origins.  Multivessel CAC.  IMPRESSION: Coronary calcium  score of 35. This was 94th percentile for age and sex matched control.  Electronically Signed: By: Vinie JAYSON Maxcy M.D. On: 08/21/2020 16:27     ______________________________________________________________________________________________        Risk Assessment/Calculations           Physical Exam VS:  BP (!) 108/48   Pulse 87   Ht 5' 2 (1.575 m)   Wt 147 lb 9.6 oz (67 kg)   LMP 10/04/2016 Comment: after procedure to stop bleeding  SpO2 99%   BMI 27.00 kg/m        Wt  Readings from Last 3 Encounters:  05/01/24 147 lb 9.6 oz (67 kg)  04/26/24 152 lb (68.9 kg)  04/24/24 153 lb (69.4 kg)    GEN: Well nourished, well developed in no acute distress.  Sitting comfortably on the exam table NECK: No JVD  CARDIAC:  RRR, no murmurs, rubs, gallops.  Radial pulses 2+ bilaterally RESPIRATORY: Expiratory wheezing heard throughout.  Normal work of breathing on room air ABDOMEN: Soft, non-tender, non-distended EXTREMITIES:  No edema in bilateral lower extremities; No deformity   ASSESSMENT AND PLAN  Nonobstructive CAD  - Patient underwent cardiac catheterization in 07/2020 that showed mild nonobstructive CAD  - Patient denies chest pain or shortness of breath  - Continue statin therapy for risk reduction  - No ASA as patient is on xarelto     HLD  - Lipid panel from 03/2024 showed LDL 10, HDL 31, triglycerides 192, total cholesterol 71  - Continue crestor  40 mg daily  - Continue fenofibrate  145 mg daily    Orthostatic hypotension  Sinus Tachycardia  - When seen in 08/2023, EKG showed sinus tachycardia with HR 115 BPM. She reported that her HR and BP had both been a bit elevated at her past few appointments. Started metoprolol  tartrate 25 mg BID. In 02/2024, she started having episodes of dizziness upon standing and low BP. Orthostatic vital signs in the office positive. Stopped metoprolol  tartrate and instead started metoprolol  succinate 12.5 mg at bedtime  - Per review of HR and BP log, HR has been in the 90s-low 100s. She is asymptomatic with this. Reports her HR has always been fast   - Hemoglobin down to 6.8 on 7/1, improved with blood transfusion. Suspect this is contributing to tachycardia and dizziness. TSH normal in 01/2024 - Dizziness has improved with reduced dose of metoprolol , but she continues to have dizziness when standing. BP at home at times in the 90s - Stop metoprolol   - Ordered 3 day zio patch to monitor HR off metoprolol   - Instructed patient  to start wearing compression stockings and increase hydration.  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 Her chemotherapy and xarelto  are currently on hold and she is receiving platelet transfusions  - Denies bleeding    Type 2 DM  - Managed by PCP  - A1c 9.3 in 04/2023  - Continue metformin , insulin     Tobacco Use   - Counseled on tobacco cessation    Dispo: Follow up in 6 weeks   Signed, Rollo FABIENE Louder, PA-C

## 2024-04-24 ENCOUNTER — Inpatient Hospital Stay

## 2024-04-24 ENCOUNTER — Inpatient Hospital Stay (HOSPITAL_BASED_OUTPATIENT_CLINIC_OR_DEPARTMENT_OTHER): Admitting: Medical Oncology

## 2024-04-24 ENCOUNTER — Encounter: Payer: Self-pay | Admitting: Hematology & Oncology

## 2024-04-24 ENCOUNTER — Inpatient Hospital Stay: Attending: Hematology & Oncology

## 2024-04-24 ENCOUNTER — Encounter: Payer: Self-pay | Admitting: Medical Oncology

## 2024-04-24 ENCOUNTER — Telehealth: Payer: Self-pay

## 2024-04-24 ENCOUNTER — Other Ambulatory Visit: Payer: Self-pay

## 2024-04-24 VITALS — BP 104/65 | HR 97 | Temp 98.0°F | Resp 18 | Ht 62.0 in | Wt 153.0 lb

## 2024-04-24 DIAGNOSIS — D6959 Other secondary thrombocytopenia: Secondary | ICD-10-CM | POA: Diagnosis not present

## 2024-04-24 DIAGNOSIS — C50011 Malignant neoplasm of nipple and areola, right female breast: Secondary | ICD-10-CM | POA: Insufficient documentation

## 2024-04-24 DIAGNOSIS — T451X5A Adverse effect of antineoplastic and immunosuppressive drugs, initial encounter: Secondary | ICD-10-CM

## 2024-04-24 DIAGNOSIS — C50012 Malignant neoplasm of nipple and areola, left female breast: Secondary | ICD-10-CM

## 2024-04-24 DIAGNOSIS — Z17421 Hormone receptor negative with human epidermal growth factor receptor 2 negative status: Secondary | ICD-10-CM

## 2024-04-24 DIAGNOSIS — G620 Drug-induced polyneuropathy: Secondary | ICD-10-CM

## 2024-04-24 DIAGNOSIS — D649 Anemia, unspecified: Secondary | ICD-10-CM

## 2024-04-24 DIAGNOSIS — D701 Agranulocytosis secondary to cancer chemotherapy: Secondary | ICD-10-CM

## 2024-04-24 DIAGNOSIS — D6481 Anemia due to antineoplastic chemotherapy: Secondary | ICD-10-CM | POA: Diagnosis not present

## 2024-04-24 DIAGNOSIS — C7989 Secondary malignant neoplasm of other specified sites: Secondary | ICD-10-CM | POA: Insufficient documentation

## 2024-04-24 DIAGNOSIS — I82C19 Acute embolism and thrombosis of unspecified internal jugular vein: Secondary | ICD-10-CM

## 2024-04-24 LAB — CBC WITH DIFFERENTIAL (CANCER CENTER ONLY)
Abs Immature Granulocytes: 0.01 10*3/uL (ref 0.00–0.07)
Basophils Absolute: 0 10*3/uL (ref 0.0–0.1)
Basophils Relative: 1 %
Eosinophils Absolute: 0 10*3/uL (ref 0.0–0.5)
Eosinophils Relative: 1 %
HCT: 19.9 % — ABNORMAL LOW (ref 36.0–46.0)
Hemoglobin: 6.8 g/dL — CL (ref 12.0–15.0)
Immature Granulocytes: 1 %
Lymphocytes Relative: 31 %
Lymphs Abs: 0.6 10*3/uL — ABNORMAL LOW (ref 0.7–4.0)
MCH: 35.6 pg — ABNORMAL HIGH (ref 26.0–34.0)
MCHC: 34.2 g/dL (ref 30.0–36.0)
MCV: 104.2 fL — ABNORMAL HIGH (ref 80.0–100.0)
Monocytes Absolute: 0.2 10*3/uL (ref 0.1–1.0)
Monocytes Relative: 9 %
Neutro Abs: 1.2 10*3/uL — ABNORMAL LOW (ref 1.7–7.7)
Neutrophils Relative %: 57 %
Platelet Count: 17 10*3/uL — ABNORMAL LOW (ref 150–400)
RBC: 1.91 MIL/uL — ABNORMAL LOW (ref 3.87–5.11)
RDW: 18.6 % — ABNORMAL HIGH (ref 11.5–15.5)
Smear Review: NORMAL
WBC Count: 2 10*3/uL — ABNORMAL LOW (ref 4.0–10.5)
nRBC: 0 % (ref 0.0–0.2)

## 2024-04-24 LAB — CMP (CANCER CENTER ONLY)
ALT: 10 U/L (ref 0–44)
AST: 16 U/L (ref 15–41)
Albumin: 3.9 g/dL (ref 3.5–5.0)
Alkaline Phosphatase: 74 U/L (ref 38–126)
Anion gap: 9 (ref 5–15)
BUN: 17 mg/dL (ref 6–20)
CO2: 24 mmol/L (ref 22–32)
Calcium: 9.2 mg/dL (ref 8.9–10.3)
Chloride: 103 mmol/L (ref 98–111)
Creatinine: 1.12 mg/dL — ABNORMAL HIGH (ref 0.44–1.00)
GFR, Estimated: 58 mL/min — ABNORMAL LOW (ref >60)
Glucose, Bld: 183 mg/dL — ABNORMAL HIGH (ref 70–99)
Potassium: 3.4 mmol/L — ABNORMAL LOW (ref 3.5–5.1)
Sodium: 136 mmol/L (ref 135–145)
Total Bilirubin: 0.3 mg/dL (ref 0.0–1.2)
Total Protein: 6.5 g/dL (ref 6.5–8.1)

## 2024-04-24 LAB — PREPARE RBC (CROSSMATCH)

## 2024-04-24 LAB — RETICULOCYTES
Immature Retic Fract: 13.7 % (ref 2.3–15.9)
RBC.: 1.92 MIL/uL — ABNORMAL LOW (ref 3.87–5.11)
Retic Count, Absolute: 19.2 10*3/uL (ref 19.0–186.0)
Retic Ct Pct: 1 % (ref 0.4–3.1)

## 2024-04-24 LAB — FERRITIN: Ferritin: 168 ng/mL (ref 11–307)

## 2024-04-24 MED ORDER — SODIUM CHLORIDE 0.9% FLUSH
10.0000 mL | Freq: Once | INTRAVENOUS | Status: AC
  Administered 2024-04-24: 10 mL via INTRAVENOUS

## 2024-04-24 MED ORDER — HEPARIN SOD (PORK) LOCK FLUSH 100 UNIT/ML IV SOLN
500.0000 [IU] | Freq: Once | INTRAVENOUS | Status: AC
  Administered 2024-04-24: 500 [IU] via INTRAVENOUS

## 2024-04-24 NOTE — Patient Instructions (Signed)

## 2024-04-24 NOTE — Progress Notes (Signed)
 This is Hematology and Oncology Follow Up Visit  GAYNELL EGGLETON [id number] January 04, 1966 58 y.o. 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 daily-start on 01/18/2024 Talazoparib 1 mg p.o. daily-start on 12/22/2023 -  changed to 0.50 mg po q day on 03/06/2024 B12 oral 1,000 mcg once daily    Interim History:  Ms. Bai is here today for follow-up.  We had to decrease her Talazoparib to 0.5 mg p.o. daily due to pancytopenia. She was also found to have B12 deficiency   She reports that she is feeling ok overall. She did start the B12 supplement as suggested at her last visit.   MRI of the brain on 03/02/2024.  This did not show any evidence of metastatic disease.  She also had a CT of the neck.  This was done on 03/05/2024.  This showed resolution of her adenopathy.  She is still smoking.  She is cut back on smoking.  She smokes about half pack per day.  Her last CA 27.29 was down to 18.9  She now has a insulin  pump.    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 There has been no bleeding to her knowledge: denies epistaxis, gingivitis, hemoptysis, hematemesis, hematuria, melena, excessive bruising, blood donation.    She has had no change in bowel or bladder habits.  She has had no leg swelling.  There is been no rashes.  She has had no fever.  Overall, I would say that her performance status is probably ECOG 1.      Wt Readings from Last 3 Encounters:  04/24/24 153 lb (69.4 kg)  04/05/24 150 lb (68 kg)  04/04/24 151 lb 3.2 oz (68.6 kg)    Medications:  Allergies as of 04/24/2024       Reactions   Lithium Other (See Comments)   Spinal fluid built up in brain   Dulaglutide  Nausea And Vomiting, Other (See Comments)   TRULICITY    Nitrofuran Derivatives Itching   Nitrofurantoin  Mono- MCR   Penicillins Other (See Comments)   UNKNOWN CHILDHOOD REACTION        Medication List        Accurate as of April 24, 2024 10:12 AM. If you have any questions, ask your nurse or doctor.          STOP taking these medications    cephALEXin  500 MG capsule Commonly known as: KEFLEX  Stopped by: Lauraine CHRISTELLA Dais   omeprazole  40 MG capsule Commonly known as: PRILOSEC Stopped by: Lauraine CHRISTELLA Dais       TAKE these medications    Accu-Chek Guide test strip Generic drug: glucose blood 3 (three) times daily.   albuterol  1.25 MG/3ML nebulizer solution Commonly known as: ACCUNEB  Take 1 ampule by nebulization every 6 (six) hours as needed for wheezing or shortness of breath.   albuterol  108 (90 Base) MCG/ACT inhaler Commonly known as: VENTOLIN  HFA Inhale 2 puffs  into the lungs every 6 (six) hours as needed for wheezing or shortness of breath.   cyclobenzaprine  5 MG tablet Commonly known as: FLEXERIL  Take 5 mg by mouth 2 (two) times daily as needed for muscle spasms.   Dexcom G7 Sensor Misc Change sensor every 10 days   fenofibrate  145 MG tablet Commonly known as: TRICOR  Take 145 mg by mouth daily.   gabapentin  300 MG capsule Commonly known as: NEURONTIN  Take 1 capsule (300 mg total) by mouth 3 (three) times daily.   HYDROcodone -acetaminophen  7.5-325 MG tablet Commonly known as: NORCO Take 1 tablet by mouth every 6 (six) hours as needed for moderate pain (pain score 4-6).   hydrOXYzine 25 MG tablet Commonly known as: ATARAX Take 25 mg by mouth 4 (four) times daily.    lidocaine -prilocaine  cream Commonly known as: EMLA  APPLY TOPICALLY 1 APPLICATION AS NEEDED   linaclotide  290 MCG Caps capsule Commonly known as: Linzess  Take 1 capsule (290 mcg total) by mouth daily before breakfast.   metFORMIN  1000 MG tablet Commonly known as: GLUCOPHAGE  Take 1,000 mg by mouth 2 (two) times daily with a meal.   metoprolol  succinate 25 MG 24 hr tablet Commonly known as: Toprol  XL Take 0.5 tablets (12.5 mg total) by mouth daily.   miconazole  200 MG vaginal suppository Commonly known as: MICOTIN Place 1 suppository (200 mg total) vaginally at bedtime.   nitroGLYCERIN  0.4 MG SL tablet Commonly known as: NITROSTAT  Place 1 tablet (0.4 mg total) under the tongue every 5 (five) minutes as needed for chest pain.   NovoLOG  100 UNIT/ML injection Generic drug: insulin  aspart Inject into the skin as directed.   Insulin  Aspart FlexPen 100 UNIT/ML Commonly known as: NOVOLOG  Inject into the skin.   OLANZapine  10 MG tablet Commonly known as: ZYPREXA  TAKE 1 TABLET BY MOUTH EVERYDAY AT BEDTIME   Omnipod 5 DexG7G6 Pods Gen 5 Misc Apply one pod every 3 days to administer insulin  continuously.   ondansetron  4 MG disintegrating tablet Commonly known as: ZOFRAN -ODT Take 1 tablet (4 mg total) by mouth every 8 (eight) hours.   ondansetron  8 MG tablet Commonly known as: ZOFRAN  Take 1 tablet (8 mg total) by mouth every 8 (eight) hours as needed for nausea or vomiting. START ON THE THIRD DAY AFTER CHEMOTHERAPY.   oxybutynin  5 MG 24 hr tablet Commonly known as: DITROPAN -XL TAKE 1 TABLET BY MOUTH EVERYDAY AT BEDTIME   pantoprazole  40 MG tablet Commonly known as: PROTONIX  Take 40 mg by mouth daily.   PEG 3350  17 g Pack Take 17 g by mouth daily as needed.   prochlorperazine  10 MG tablet Commonly known as: COMPAZINE  Take 1 tablet (10 mg total) by mouth every 6 (six) hours as needed for nausea or vomiting (Zofran  resumes 12/16).   QUEtiapine  400 MG tablet Commonly  known as: SEROQUEL  TAKE 2 TABLETS (800 MG TOTAL) BY MOUTH AT BEDTIME.   rivaroxaban  10 MG Tabs tablet Commonly known as: XARELTO  Take 1 tablet (10 mg total) by mouth daily.   rosuvastatin  40 MG tablet Commonly known as: CRESTOR  Take 1 tablet (40 mg total) by mouth daily.   senna 8.6 MG Tabs tablet Commonly known as: SENOKOT Take 1 tablet (8.6 mg total) by mouth 2 (two) times daily.   talazoparib tosylate  0.5 MG capsule Commonly known as: Talzenna  Take 1 capsule (0.5 mg total) by mouth daily.        Allergies:  Allergies  Allergen Reactions   Lithium Other (See Comments)    Spinal fluid  built up in brain   Dulaglutide  Nausea And Vomiting and Other (See Comments)    TRULICITY    Nitrofuran Derivatives Itching    Nitrofurantoin  Mono- MCR   Penicillins Other (See Comments)    UNKNOWN CHILDHOOD REACTION    Past Medical History, Surgical history, Social history, and Family History were reviewed and updated.  Review of Systems: Review of Systems  Constitutional:  Positive for malaise/fatigue.  HENT: Negative.    Eyes: Negative.   Respiratory: Negative.    Cardiovascular:  Negative for leg swelling.  Gastrointestinal:  Positive for nausea.  Genitourinary: Negative.   Musculoskeletal:  Positive for joint pain and myalgias.  Skin: Negative.   Neurological:  Positive for tingling.  Endo/Heme/Allergies: Negative.   Psychiatric/Behavioral: Negative.       Physical Exam:  height is 5' 2 (1.575 m) and weight is 153 lb (69.4 kg). Her oral temperature is 98 F (36.7 C). Her blood pressure is 104/65 and her pulse is 97. Her respiration is 18 and oxygen saturation is 100%.   Wt Readings from Last 3 Encounters:  04/24/24 153 lb (69.4 kg)  04/05/24 150 lb (68 kg)  04/04/24 151 lb 3.2 oz (68.6 kg)    Physical Exam Vitals reviewed.  HENT:     Head: Normocephalic and atraumatic.     Comments: She does not have any swelling now.  I do not feel any firmness on the left side  of her neck.  No adenopathy is noted.  She has no intraoral lesions.   Eyes:     Pupils: Pupils are equal, round, and reactive to light.    Cardiovascular:     Rate and Rhythm: Normal rate and regular rhythm.     Heart sounds: Normal heart sounds.  Pulmonary:     Effort: Pulmonary effort is normal.     Breath sounds: Normal breath sounds.  Abdominal:     General: Bowel sounds are normal. There is no distension.     Palpations: Abdomen is soft.     Tenderness: There is no abdominal tenderness. There is no guarding.   Musculoskeletal:        General: No deformity. Normal range of motion.     Cervical back: Normal range of motion.  Lymphadenopathy:     Cervical: No cervical adenopathy.   Skin:    General: Skin is warm and dry.     Coloration: Skin is not jaundiced or pale.     Findings: No bruising, erythema or rash.   Neurological:     Mental Status: She is alert and oriented to person, place, and time.   Psychiatric:        Behavior: Behavior normal.        Thought Content: Thought content normal.        Judgment: Judgment normal.     Lab Results  Component Value Date   WBC 4.5 03/20/2024   HGB 8.9 (L) 03/20/2024   HCT 26.8 (L) 03/20/2024   MCV 103.1 (H) 03/20/2024   PLT 119 (L) 03/20/2024   Lab Results  Component Value Date   FERRITIN 100 03/06/2024   IRON 82 03/06/2024   TIBC 314 03/06/2024   UIBC 232 03/06/2024   IRONPCTSAT 26 03/06/2024   Lab Results  Component Value Date   RETICCTPCT 1.0 04/24/2024   RBC 1.92 (L) 04/24/2024   No results found for: KPAFRELGTCHN, LAMBDASER, KAPLAMBRATIO No results found for: IGGSERUM, IGA, IGMSERUM No results found for: TOTALPROTELP, ALBUMINELP, A1GS, A2GS, BETS, BETA2SER, GAMS,  MSPIKE, SPEI   Chemistry      Component Value Date/Time   NA 135 03/20/2024 1205   K 3.8 03/20/2024 1205   CL 102 03/20/2024 1205   CO2 24 03/20/2024 1205   BUN 18 03/20/2024 1205   CREATININE 0.86  03/20/2024 1205      Component Value Date/Time   CALCIUM  9.4 03/20/2024 1205   ALKPHOS 80 03/20/2024 1205   AST 18 03/20/2024 1205   ALT 12 03/20/2024 1205   BILITOT 0.3 03/20/2024 1205     No diagnosis found.  Impression and Plan: Ms. Fanton is a very pleasant  58 yo caucasian female with recurrent ductal carcinoma of the left breast.  It is amazing that the recurrence was just been in the neck.  She is treated with incredibly aggressive chemo radiation therapy.  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  Again this worked very very nicely. She is now on dose reduced Talzenna .   Today her counts are down again from the Talzenna . No bleeding or signs of infection. She will HOLD her Talzenna  along with her Xarelto  given her platelet count of 17. She will need another dose reduction of this medication when counts are safe to restart. She will return tomorrow for 2 units of blood for her Hgb of 6.8. Bleeding precautions discussed.   RTC tomorrow for 2 units of blood RTC Friday APP, port labs(CBC, type cross), possible 1 unit of platelets.   Lauraine CHRISTELLA Dais, PA-C 7/1/202510:12 AM

## 2024-04-24 NOTE — Patient Instructions (Signed)
 Return tomorrow for 2 units of blood HOLD Xarelto  and your Talzenna  until counts recover

## 2024-04-24 NOTE — Telephone Encounter (Signed)
 Critical plt of 17, and hgb of 6.8 received from lab. [facility] informed, type and screen ordered. Pt will see provider before determining transfusion amount.

## 2024-04-25 ENCOUNTER — Inpatient Hospital Stay: Admitting: Licensed Clinical Social Worker

## 2024-04-25 ENCOUNTER — Inpatient Hospital Stay

## 2024-04-25 ENCOUNTER — Ambulatory Visit: Payer: Self-pay | Admitting: Hematology & Oncology

## 2024-04-25 DIAGNOSIS — C50011 Malignant neoplasm of nipple and areola, right female breast: Secondary | ICD-10-CM | POA: Diagnosis not present

## 2024-04-25 DIAGNOSIS — D649 Anemia, unspecified: Secondary | ICD-10-CM

## 2024-04-25 LAB — CANCER ANTIGEN 27.29: CA 27.29: 14.8 U/mL (ref 0.0–38.6)

## 2024-04-25 LAB — ERYTHROPOIETIN: Erythropoietin: 1175 m[IU]/mL — ABNORMAL HIGH (ref 2.6–18.5)

## 2024-04-25 MED ORDER — DIPHENHYDRAMINE HCL 25 MG PO CAPS: 25.0000 mg | ORAL_CAPSULE | Freq: Once | ORAL | Status: DC

## 2024-04-25 MED ORDER — SODIUM CHLORIDE 0.9% FLUSH
10.0000 mL | INTRAVENOUS | Status: AC | PRN
  Administered 2024-04-25: 10 mL

## 2024-04-25 MED ORDER — ACETAMINOPHEN 325 MG PO TABS: 650.0000 mg | ORAL_TABLET | Freq: Once | ORAL | Status: DC

## 2024-04-25 MED ORDER — SODIUM CHLORIDE 0.9% IV SOLUTION
250.0000 mL | INTRAVENOUS | Status: DC
  Administered 2024-04-25: 250 mL via INTRAVENOUS

## 2024-04-25 MED ORDER — HEPARIN SOD (PORK) LOCK FLUSH 100 UNIT/ML IV SOLN
500.0000 [IU] | Freq: Every day | INTRAVENOUS | Status: AC | PRN
  Administered 2024-04-25: 500 [IU]

## 2024-04-25 NOTE — Telephone Encounter (Signed)
 Pt in office for blood transfusion, informed in person, pt declines any questions or concerns at this time

## 2024-04-25 NOTE — Telephone Encounter (Signed)
-----   Message from Maude JONELLE Crease sent at 04/25/2024 10:19 AM EDT ----- Please call and let her know that the tumor marker still is coming down.  Jeralyn ----- Message ----- From: Rebecka, Lab In [place] Sent: 04/24/2024  10:00 AM EDT To: Maude JONELLE Crease, MD

## 2024-04-25 NOTE — Progress Notes (Signed)
 [facility] CSW Progress Note  Visual merchandiser met with patient to follow-up on financial concerns.    Interventions: Provided patient with information about the [employer] process.  She stated she needs to obtain proof of income from social security.  Encouraged patient to obtain so that she could receive the grant.  She said she understood.       Follow Up Plan:  Patient will contact CSW with any support or resource needs    Macario CHRISTELLA Au, LCSW Clinical Social Worker [facility]    Patient is participating in a Managed Medicaid Plan:  Yes

## 2024-04-25 NOTE — Patient Instructions (Signed)
 Blood Transfusion, Adult A blood transfusion is a procedure in which you receive blood or a type of blood cell (blood component) through an IV. You may need a blood transfusion when you have a low blood count, which is a low number of any blood cell. This may result from a bleeding disorder, illness, injury, or surgery. The blood may come from a donor, or you may be able to have your own blood collected and stored (autologous blood donation) before a planned surgery. The blood given in a transfusion may be made up of different blood components. You may receive: Red blood cells. These carry oxygen to the cells in the body. Platelets. These help your blood to clot. Plasma. This is the liquid part of your blood. It carries proteins and other substances throughout the body. White blood cells. These help you fight infections. If you have hemophilia or another clotting disorder, you may also receive other types of blood products. Depending on the type of blood product, this procedure may take 1-4 hours to complete. Tell a health care provider about: Any bleeding problems you have. Any previous reactions you have had during a blood transfusion. Any allergies you have. All medicines you are taking, including vitamins, herbs, eye drops, creams, and over-the-counter medicines. Any surgeries you have had. Any medical conditions you have. Whether you are pregnant or may be pregnant. What are the risks? Talk with your health care provider about risks. The most common problems include: A mild allergic reaction, such as red, swollen areas of skin (hives) and itching. Fever or chills. This may be the body's response to new blood cells received. This may occur during or up to 4 hours after the transfusion. More serious problems may include: A serious allergic reaction that causes difficulty breathing or swelling around the face and lips. Transfusion-associated circulatory overload (TACO), or too much fluid in  the lungs. This may cause breathing problems. Transfusion-related acute lung injury (TRALI), which causes breathing difficulty and low oxygen in the blood. This can occur within hours of the transfusion or several days later. Iron overload. This can happen after receiving many blood transfusions over a period of time. Infection or virus being transmitted. This is rare because donated blood is carefully tested before it is given. Hemolytic transfusion reaction. This is rare. It happens when the body's defense system (immune system)tries to attack the new blood cells. Symptoms may include fever, chills, nausea, low blood pressure, and low back or chest pain. Transfusion-associated graft-versus-host disease (TAGVHD). This is rare. It happens when donated cells attack the body's healthy tissues. What happens before the procedure? You will have a blood test to check your blood type. This test is done to know what kind of blood your body will accept and to match it to the donor blood. If you are going to have a planned surgery, you may be able to do an autologous blood donation. This may be done in case you need to have a transfusion. You will have your temperature, blood pressure, and pulse checked before the transfusion. If you have had an allergic reaction to a transfusion in the past, you may be given medicine to help prevent a reaction. This medicine may be given to you by mouth (orally) or through an IV. What happens during the procedure?  An IV will be inserted into one of your veins. The bag of blood will be attached to your IV. The blood will then enter through your vein. Your temperature, blood pressure,  and pulse will be monitored during the transfusion. This monitoring is done to detect early signs of a transfusion reaction. Tell your nurse right away if you have any of these symptoms during the transfusion: Shortness of breath or trouble breathing. Chest or back pain. Fever or  chills. Itching or hives. If you have any signs or symptoms of a reaction, your transfusion will be stopped and you may be given medicine. When the transfusion is complete, your IV will be removed. Pressure may be applied to the IV site for a few minutes. A bandage (dressing)will be applied. The procedure may vary among health care providers and hospitals. What happens after the procedure? Your temperature, blood pressure, pulse, breathing rate, and blood oxygen level will be monitored until you leave the hospital or clinic. Your blood may be tested to see how you have responded to the transfusion. You may be warmed with fluids or blankets to maintain a normal body temperature. If you receive your blood transfusion in an outpatient setting, you will be told whom to contact to report any reactions. Where to find more information Visit the [employer]: redcross.org Summary A blood transfusion is a procedure in which you receive blood or a type of blood cell (blood component) through an IV. The blood given in a transfusion may be made up of different blood components. You may receive red blood cells, platelets, plasma, or white blood cells depending on the condition treated. Your temperature, blood pressure, and pulse will be monitored before, during, and after the transfusion. After the transfusion, your blood may be tested to see how your body has responded. This information is not intended to replace advice given to you by your health care provider. Make sure you discuss any questions you have with your health care provider. Document Revised: 01/08/2022 Document Reviewed: 01/08/2022 Elsevier Patient Education  2024 [employer].

## 2024-04-26 ENCOUNTER — Other Ambulatory Visit: Payer: Self-pay

## 2024-04-26 ENCOUNTER — Inpatient Hospital Stay

## 2024-04-26 ENCOUNTER — Ambulatory Visit: Payer: Self-pay | Admitting: Medical Oncology

## 2024-04-26 ENCOUNTER — Inpatient Hospital Stay: Admitting: Medical Oncology

## 2024-04-26 VITALS — BP 105/63 | HR 100 | Temp 98.2°F | Resp 17 | Ht 62.0 in | Wt 152.0 lb

## 2024-04-26 DIAGNOSIS — D6959 Other secondary thrombocytopenia: Secondary | ICD-10-CM

## 2024-04-26 DIAGNOSIS — Z95828 Presence of other vascular implants and grafts: Secondary | ICD-10-CM

## 2024-04-26 DIAGNOSIS — D649 Anemia, unspecified: Secondary | ICD-10-CM | POA: Diagnosis not present

## 2024-04-26 DIAGNOSIS — D701 Agranulocytosis secondary to cancer chemotherapy: Secondary | ICD-10-CM

## 2024-04-26 DIAGNOSIS — T451X5A Adverse effect of antineoplastic and immunosuppressive drugs, initial encounter: Secondary | ICD-10-CM

## 2024-04-26 DIAGNOSIS — G620 Drug-induced polyneuropathy: Secondary | ICD-10-CM | POA: Diagnosis not present

## 2024-04-26 DIAGNOSIS — C50012 Malignant neoplasm of nipple and areola, left female breast: Secondary | ICD-10-CM | POA: Diagnosis not present

## 2024-04-26 DIAGNOSIS — C50011 Malignant neoplasm of nipple and areola, right female breast: Secondary | ICD-10-CM | POA: Diagnosis not present

## 2024-04-26 DIAGNOSIS — D6481 Anemia due to antineoplastic chemotherapy: Secondary | ICD-10-CM

## 2024-04-26 LAB — TYPE AND SCREEN
ABO/RH(D): A POS
Antibody Screen: NEGATIVE
Unit division: 0
Unit division: 0

## 2024-04-26 LAB — BPAM RBC
Blood Product Expiration Date: 202507292359
Blood Product Unit Number: 202507272359
ISSUE DATE / TIME: 202507020734
ISSUE DATE / TIME: 202507020734
Unit Type and Rh: 6200
Unit Type and Rh: 6200
Unit Type and Rh: 6200
Unit Type and Rh: 6200

## 2024-04-26 LAB — CBC WITH DIFFERENTIAL (CANCER CENTER ONLY)
Abs Immature Granulocytes: 0.02 10*3/uL (ref 0.00–0.07)
Basophils Absolute: 0 10*3/uL (ref 0.0–0.1)
Basophils Relative: 0 %
Eosinophils Absolute: 0 10*3/uL (ref 0.0–0.5)
Eosinophils Relative: 1 %
HCT: 28.8 % — ABNORMAL LOW (ref 36.0–46.0)
Hemoglobin: 10 g/dL — ABNORMAL LOW (ref 12.0–15.0)
Immature Granulocytes: 1 %
Lymphocytes Relative: 31 %
Lymphs Abs: 0.7 10*3/uL (ref 0.7–4.0)
MCH: 33.4 pg (ref 26.0–34.0)
MCHC: 34.7 g/dL (ref 30.0–36.0)
MCV: 96.3 fL (ref 80.0–100.0)
Monocytes Absolute: 0.2 10*3/uL (ref 0.1–1.0)
Monocytes Relative: 9 %
Neutro Abs: 1.3 10*3/uL — ABNORMAL LOW (ref 1.7–7.7)
Neutrophils Relative %: 58 %
Platelet Count: 17 10*3/uL — ABNORMAL LOW (ref 150–400)
RBC: 2.99 MIL/uL — ABNORMAL LOW (ref 3.87–5.11)
RDW: 20 % — ABNORMAL HIGH (ref 11.5–15.5)
Smear Review: NORMAL
WBC Count: 2.2 10*3/uL — ABNORMAL LOW (ref 4.0–10.5)
nRBC: 0 % (ref 0.0–0.2)

## 2024-04-26 MED ORDER — HEPARIN SOD (PORK) LOCK FLUSH 100 UNIT/ML IV SOLN
500.0000 [IU] | Freq: Once | INTRAVENOUS | Status: AC
  Administered 2024-04-26: 500 [IU] via INTRAVENOUS

## 2024-04-26 MED ORDER — SODIUM CHLORIDE 0.9% FLUSH
10.0000 mL | Freq: Once | INTRAVENOUS | Status: AC
  Administered 2024-04-26: 10 mL via INTRAVENOUS

## 2024-04-26 NOTE — Progress Notes (Signed)
 Critical results received from lab, Plt. 17. Dr. Timmy aware. Patient will receive two units of platelets today.

## 2024-04-26 NOTE — Patient Instructions (Signed)

## 2024-04-26 NOTE — Addendum Note (Signed)
 Addended by: Enrica Corliss E on: 04/26/2024 11:37 AM   Modules accepted: Orders

## 2024-04-26 NOTE — Progress Notes (Signed)
 This is Hematology and Oncology Follow Up Visit  Ariana Herrera [id number] 06/28/1966 58 y.o. 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 daily-start on 01/18/2024 Talazoparib 1 mg p.o. daily-start on 12/22/2023 -  changed to 0.50 mg po q day on 03/06/2024 B12 oral 1,000 mcg once daily    Interim History:  Ariana Herrera is here today for follow-up.  We had to decrease her Talazoparib to 0.5 mg p.o. daily due to pancytopenia. She was also found to have B12 deficiency   She states that she is feeling well. A lot better than at her last visit. She tolerated her 2 units of blood well and fatigue has resolved. She does mention just getting over a UTI for which she took 17 days worth of keflex .   She is taking her B12 supplement  MRI of the brain on 03/02/2024.  This did not show any evidence of metastatic disease.  She also had a CT of the neck.  This was done on 03/05/2024.  This showed resolution of her adenopathy.  She is still smoking.  She is cut back on smoking.  She smokes about half pack per day.  Her last CA 27.29 was down to 14.8 from 18.9 1 month prior   She now has a insulin  pump.    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 There has been no bleeding to her knowledge: denies epistaxis, gingivitis, hemoptysis, hematemesis, hematuria, melena, excessive bruising, blood donation.   She has had  no change in bowel or bladder habits.  She has had no leg swelling.  There is been no rashes.  She has had no fever.  Overall, I would say that her performance status is probably ECOG 1.     Wt Readings from Last 3 Encounters:  04/26/24 152 lb (68.9 kg)  04/24/24 153 lb (69.4 kg)  04/05/24 150 lb (68 kg)    Medications:  Allergies as of 04/26/2024       Reactions   Lithium Other (See Comments)   Spinal fluid built up in brain   Dulaglutide  Nausea And Vomiting, Other (See Comments)   TRULICITY    Nitrofuran Derivatives Itching   Nitrofurantoin  Mono- MCR   Penicillins Other (See Comments)   UNKNOWN CHILDHOOD REACTION        Medication List        Accurate as of April 26, 2024 12:44 PM. If you have any questions, ask your nurse or doctor.          Accu-Chek Guide test strip Generic drug: glucose blood 3 (three) times daily.   albuterol  1.25 MG/3ML nebulizer solution Commonly known as: ACCUNEB  Take 1 ampule by nebulization every 6 (six) hours as needed for wheezing or shortness of breath.   albuterol  108 (90 Base) MCG/ACT inhaler Commonly known as: VENTOLIN  HFA Inhale 2 puffs into the lungs every 6 (six) hours as needed for wheezing  or shortness of breath.   cyclobenzaprine  5 MG tablet Commonly known as: FLEXERIL  Take 5 mg by mouth 2 (two) times daily as needed for muscle spasms.   Dexcom G7 Sensor Misc Change sensor every 10 days   fenofibrate  145 MG tablet Commonly known as: TRICOR  Take 145 mg by mouth daily.   gabapentin  300 MG capsule Commonly known as: NEURONTIN  Take 1 capsule (300 mg total) by mouth 3 (three) times daily.   HYDROcodone -acetaminophen  7.5-325 MG tablet Commonly known as: NORCO Take 1 tablet by mouth every 6 (six) hours as needed for moderate pain (pain score 4-6).   hydrOXYzine 25 MG tablet Commonly known as: ATARAX Take 25 mg by mouth 4 (four) times daily.   lidocaine -prilocaine  cream Commonly known as: EMLA  APPLY TOPICALLY 1  APPLICATION AS NEEDED   linaclotide  290 MCG Caps capsule Commonly known as: Linzess  Take 1 capsule (290 mcg total) by mouth daily before breakfast.   metFORMIN  1000 MG tablet Commonly known as: GLUCOPHAGE  Take 1,000 mg by mouth 2 (two) times daily with a meal.   metoprolol  succinate 25 MG 24 hr tablet Commonly known as: Toprol  XL Take 0.5 tablets (12.5 mg total) by mouth daily.   miconazole  200 MG vaginal suppository Commonly known as: MICOTIN Place 1 suppository (200 mg total) vaginally at bedtime.   nitroGLYCERIN  0.4 MG SL tablet Commonly known as: NITROSTAT  Place 1 tablet (0.4 mg total) under the tongue every 5 (five) minutes as needed for chest pain.   NovoLOG  100 UNIT/ML injection Generic drug: insulin  aspart Inject into the skin as directed.   Insulin  Aspart FlexPen 100 UNIT/ML Commonly known as: NOVOLOG  Inject into the skin.   OLANZapine  10 MG tablet Commonly known as: ZYPREXA  TAKE 1 TABLET BY MOUTH EVERYDAY AT BEDTIME   Omnipod 5 DexG7G6 Pods Gen 5 Misc Apply one pod every 3 days to administer insulin  continuously.   ondansetron  4 MG disintegrating tablet Commonly known as: ZOFRAN -ODT Take 1 tablet (4 mg total) by mouth every 8 (eight) hours.   ondansetron  8 MG tablet Commonly known as: ZOFRAN  Take 1 tablet (8 mg total) by mouth every 8 (eight) hours as needed for nausea or vomiting. START ON THE THIRD DAY AFTER CHEMOTHERAPY.   oxybutynin  5 MG 24 hr tablet Commonly known as: DITROPAN -XL TAKE 1 TABLET BY MOUTH EVERYDAY AT BEDTIME   pantoprazole  40 MG tablet Commonly known as: PROTONIX  Take 40 mg by mouth daily.   PEG 3350  17 g Pack Take 17 g by mouth daily as needed.   prochlorperazine  10 MG tablet Commonly known as: COMPAZINE  Take 1 tablet (10 mg total) by mouth every 6 (six) hours as needed for nausea or vomiting (Zofran  resumes 12/16).   QUEtiapine  400 MG tablet Commonly known as: SEROQUEL  TAKE 2 TABLETS (800 MG TOTAL) BY MOUTH AT BEDTIME.    rivaroxaban  10 MG Tabs tablet Commonly known as: XARELTO  Take 1 tablet (10 mg total) by mouth daily.   rosuvastatin  40 MG tablet Commonly known as: CRESTOR  Take 1 tablet (40 mg total) by mouth daily.   senna 8.6 MG Tabs tablet Commonly known as: SENOKOT Take 1 tablet (8.6 mg total) by mouth 2 (two) times daily.   talazoparib tosylate  0.5 MG capsule Commonly known as: Talzenna  Take 1 capsule (0.5 mg total) by mouth daily.        Allergies:  Allergies  Allergen Reactions   Lithium Other (See Comments)    Spinal fluid built up in brain   Dulaglutide  Nausea And Vomiting and  Other (See Comments)    TRULICITY    Nitrofuran Derivatives Itching    Nitrofurantoin  Mono- MCR   Penicillins Other (See Comments)    UNKNOWN CHILDHOOD REACTION    Past Medical History, Surgical history, Social history, and Family History were reviewed and updated.  Review of Systems: Review of Systems  Constitutional:  Negative for malaise/fatigue.  HENT: Negative.    Eyes: Negative.   Respiratory: Negative.    Cardiovascular:  Negative for leg swelling.  Gastrointestinal:  Positive for nausea.  Genitourinary: Negative.   Musculoskeletal:  Positive for joint pain and myalgias.  Skin: Negative.   Neurological:  Positive for tingling.  Endo/Heme/Allergies: Negative.   Psychiatric/Behavioral: Negative.       Physical Exam:  height is 5' 2 (1.575 m) and weight is 152 lb (68.9 kg). Her oral temperature is 98.2 F (36.8 C). Her blood pressure is 105/63 and her pulse is 100. Her respiration is 17 and oxygen saturation is 100%.   Wt Readings from Last 3 Encounters:  04/26/24 152 lb (68.9 kg)  04/24/24 153 lb (69.4 kg)  04/05/24 150 lb (68 kg)    Physical Exam Vitals reviewed.  HENT:     Head: Normocephalic and atraumatic.     Comments: She does not have any swelling now.  I do not feel any firmness on the left side of her neck.  No adenopathy is noted.  She has no intraoral lesions.  Eyes:      Pupils: Pupils are equal, round, and reactive to light.  Cardiovascular:     Rate and Rhythm: Normal rate and regular rhythm.     Heart sounds: Normal heart sounds.  Pulmonary:     Effort: Pulmonary effort is normal.     Breath sounds: Normal breath sounds.  Abdominal:     General: Bowel sounds are normal. There is no distension.     Palpations: Abdomen is soft.     Tenderness: There is no abdominal tenderness. There is no guarding.  Musculoskeletal:        General: No deformity. Normal range of motion.     Cervical back: Normal range of motion.  Lymphadenopathy:     Cervical: No cervical adenopathy.  Skin:    General: Skin is warm and dry.     Coloration: Skin is not jaundiced or pale.     Findings: No bruising, erythema or rash.  Neurological:     Mental Status: She is alert and oriented to person, place, and time.  Psychiatric:        Behavior: Behavior normal.        Thought Content: Thought content normal.        Judgment: Judgment normal.     Lab Results  Component Value Date   WBC 2.2 (L) 04/26/2024   HGB 10.0 (L) 04/26/2024   HCT 28.8 (L) 04/26/2024   MCV 96.3 04/26/2024   PLT 17 (L) 04/26/2024   Lab Results  Component Value Date   FERRITIN 168 04/24/2024   IRON 82 03/06/2024   TIBC 314 03/06/2024   UIBC 232 03/06/2024   IRONPCTSAT 26 03/06/2024   Lab Results  Component Value Date   RETICCTPCT 1.0 04/24/2024   RBC 2.99 (L) 04/26/2024   No results found for: KPAFRELGTCHN, LAMBDASER, KAPLAMBRATIO No results found for: IGGSERUM, IGA, IGMSERUM No results found for: TOTALPROTELP, ALBUMINELP, A1GS, A2GS, BETS, BETA2SER, GAMS, MSPIKE, SPEI   Chemistry      Component Value Date/Time   NA 136 04/24/2024 0935  K 3.4 (L) 04/24/2024 0935   CL 103 04/24/2024 0935   CO2 24 04/24/2024 0935   BUN 17 04/24/2024 0935   CREATININE 1.12 (H) 04/24/2024 0935      Component Value Date/Time   CALCIUM  9.2 04/24/2024 0935   ALKPHOS 74  04/24/2024 0935   AST 16 04/24/2024 0935   ALT 10 04/24/2024 0935   BILITOT 0.3 04/24/2024 0935     Encounter Diagnoses  Name Primary?   Port-A-Cath in place Yes   Anemia, unspecified type    Malignant neoplasm involving both nipple and areola of left breast in female, unspecified estrogen receptor status (HCC)    Chemotherapy-induced neuropathy (HCC)    Chemotherapy-induced thrombocytopenia    Chemotherapy induced neutropenia (HCC)    Antineoplastic chemotherapy induced anemia     Impression and Plan: Ms. Pikus is a very pleasant  58 yo caucasian female with recurrent ductal carcinoma of the left breast.  Recurrence was in the neck. She was treated with incredibly aggressive chemo radiation therapy.  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  Again this worked very very nicely. She is now on dose reduced Talzenna .   Today her Hgb is improved from 6.8 to 10 with the assistance of 2 units of blood and holding of the Talzenna . No bleeding or signs of infection. WBC is improving. Platelets continue to be low at 17. She will HOLD her Talzenna  along with her Xarelto  given her platelet count of 17 x 1 week. She will need another dose reduction of this medication when counts are safe to restart.   No platelets today. Expect counts to start recovering as we continue to hold her Talzenna  and she is now off of her ABX for UTI.  RTC Tuesday or Wednesday APP, port labs(CBC, type cross)+_ platelets   Lauraine CHRISTELLA Dais, [place] 7/3/202512:44 PM

## 2024-04-30 ENCOUNTER — Telehealth: Payer: Self-pay | Admitting: Medical Oncology

## 2024-04-30 NOTE — Telephone Encounter (Signed)
 Lvm for patient to rtetunr call for scheduling from 04/26/24 los.

## 2024-05-01 ENCOUNTER — Ambulatory Visit: Attending: Cardiology | Admitting: Cardiology

## 2024-05-01 ENCOUNTER — Other Ambulatory Visit: Payer: Self-pay

## 2024-05-01 ENCOUNTER — Ambulatory Visit

## 2024-05-01 ENCOUNTER — Encounter: Payer: Self-pay | Admitting: Cardiology

## 2024-05-01 VITALS — BP 108/48 | HR 87 | Ht 62.0 in | Wt 147.6 lb

## 2024-05-01 DIAGNOSIS — I2510 Atherosclerotic heart disease of native coronary artery without angina pectoris: Secondary | ICD-10-CM | POA: Insufficient documentation

## 2024-05-01 DIAGNOSIS — E785 Hyperlipidemia, unspecified: Secondary | ICD-10-CM | POA: Insufficient documentation

## 2024-05-01 DIAGNOSIS — I951 Orthostatic hypotension: Secondary | ICD-10-CM | POA: Insufficient documentation

## 2024-05-01 DIAGNOSIS — E1169 Type 2 diabetes mellitus with other specified complication: Secondary | ICD-10-CM | POA: Insufficient documentation

## 2024-05-01 DIAGNOSIS — Z720 Tobacco use: Secondary | ICD-10-CM | POA: Insufficient documentation

## 2024-05-01 DIAGNOSIS — R000 Tachycardia, unspecified: Secondary | ICD-10-CM | POA: Diagnosis present

## 2024-05-01 NOTE — Patient Instructions (Signed)
 Medication Instructions:  Stop metoprolol  *If you need a refill on your cardiac medications before your next appointment, please call your pharmacy*  Lab Work: No labs  Testing/Procedures: Next Page  Follow-Up: At [facility], you and your health needs are our priority.  As part of our continuing mission to provide you with exceptional heart care, our providers are all part of one team.  This team includes your primary Cardiologist (physician) and Advanced Practice Providers or APPs (Physician Assistants and Nurse Practitioners) who all work together to provide you with the care you need, when you need it.  Your next appointment:   6 week(s)  Provider:   Katlyn West, NP  We recommend signing up for the patient portal called MyChart.  Sign up information is provided on this After Visit Summary.  MyChart is used to connect with patients for Virtual Visits (Telemedicine).  Patients are able to view lab/test results, encounter notes, upcoming appointments, etc.  Non-urgent messages can be sent to your provider as well.   To learn more about what you can do with MyChart, go to [email or web address].   Other Instructions ZIO XT- Long Term Monitor Instructions  Your physician has requested you wear a ZIO patch monitor for 14 days.  This is a single patch monitor. [employer] supplies one patch monitor per enrollment. Additional stickers are not available. Please do not apply patch if you will be having a Nuclear Stress Test,  Echocardiogram, Cardiac CT, MRI, or Chest Xray during the period you would be wearing the  monitor. The patch cannot be worn during these tests. You cannot remove and re-apply the  ZIO XT patch monitor.  Your ZIO patch monitor will be mailed 3 day USPS to your address on file. It may take 3-5 days  to receive your monitor after you have been enrolled.  Once you have received your monitor, please review the enclosed instructions. Your monitor  has already  been registered assigning a specific monitor serial # to you.  Billing and Patient Assistance Program Information  We have supplied [employer] with any of your insurance information on file for billing purposes. [employer] offers a sliding scale Patient Assistance Program for patients that do not have  insurance, or whose insurance does not completely cover the cost of the ZIO monitor.  You must apply for the Patient Assistance Program to qualify for this discounted rate.  To apply, please call [employer] at [phone], select option 4, select option 2, ask to apply for  Patient Assistance Program. Meredeth will ask your household income, and how many people  are in your household. They will quote your out-of-pocket cost based on that information.  [employer] will also be able to set up a 67-month, interest-free payment plan if needed.  Applying the monitor   Shave hair from upper left chest.  Hold abrader disc by orange tab. Rub abrader in 40 strokes over the upper left chest as  indicated in your monitor instructions.  Clean area with 4 enclosed alcohol pads. Let dry.  Apply patch as indicated in monitor instructions. Patch will be placed under collarbone on left  side of chest with arrow pointing upward.  Rub patch adhesive wings for 2 minutes. Remove white label marked 1. Remove the white  label marked 2. Rub patch adhesive wings for 2 additional minutes.  While looking in a mirror, press and release button in center of patch. A small green light will  flash 3-4 times. This will be your only indicator  that the monitor has been turned on.  Do not shower for the first 24 hours. You may shower after the first 24 hours.  Press the button if you feel a symptom. You will hear a small click. Record Date, Time and  Symptom in the Patient Logbook.  When you are ready to remove the patch, follow instructions on the last 2 pages of Patient  Logbook. Stick patch monitor onto the last page of Patient  Logbook.  Place Patient Logbook in the blue and white box. Use locking tab on box and tape box closed  securely. The blue and white box has prepaid postage on it. Please place it in the mailbox as  soon as possible. Your physician should have your test results approximately 7 days after the  monitor has been mailed back to [facility].  Call [facility] Customer Care at [phone] if you have questions regarding  your ZIO XT patch monitor. Call them immediately if you see an orange light blinking on your  monitor.  If your monitor falls off in less than 4 days, contact our Monitor department at [phone].  If your monitor becomes loose or falls off after 4 days call [employer] at [phone] for  suggestions on securing your monitor

## 2024-05-01 NOTE — Progress Notes (Unsigned)
Enrolled patient for a 3 day Zio XT monitor to be mailed to patients home   Hilty to read

## 2024-05-02 ENCOUNTER — Other Ambulatory Visit: Payer: Self-pay

## 2024-05-02 DIAGNOSIS — D649 Anemia, unspecified: Secondary | ICD-10-CM

## 2024-05-02 DIAGNOSIS — C770 Secondary and unspecified malignant neoplasm of lymph nodes of head, face and neck: Secondary | ICD-10-CM

## 2024-05-02 DIAGNOSIS — T451X5A Adverse effect of antineoplastic and immunosuppressive drugs, initial encounter: Secondary | ICD-10-CM

## 2024-05-02 DIAGNOSIS — C50011 Malignant neoplasm of nipple and areola, right female breast: Secondary | ICD-10-CM

## 2024-05-03 ENCOUNTER — Telehealth: Payer: Self-pay | Admitting: Hematology & Oncology

## 2024-05-03 NOTE — Telephone Encounter (Signed)
 Called to schedule lab appt per inbasket. LVM to return call for scheduling.

## 2024-05-04 ENCOUNTER — Other Ambulatory Visit

## 2024-05-04 ENCOUNTER — Ambulatory Visit: Admitting: Family

## 2024-05-04 ENCOUNTER — Encounter

## 2024-05-04 ENCOUNTER — Inpatient Hospital Stay

## 2024-05-05 ENCOUNTER — Encounter: Payer: Self-pay | Admitting: Physician Assistant

## 2024-05-07 ENCOUNTER — Ambulatory Visit (INDEPENDENT_AMBULATORY_CARE_PROVIDER_SITE_OTHER): Admitting: Physician Assistant

## 2024-05-07 ENCOUNTER — Other Ambulatory Visit (HOSPITAL_COMMUNITY)
Admission: RE | Admit: 2024-05-07 | Discharge: 2024-05-07 | Disposition: A | Discharge status: Home / Self Care | Source: Ambulatory Visit | Attending: Physician Assistant | Admitting: Physician Assistant

## 2024-05-07 ENCOUNTER — Encounter: Payer: Self-pay | Admitting: Physician Assistant

## 2024-05-07 ENCOUNTER — Encounter: Payer: Self-pay | Admitting: Internal Medicine

## 2024-05-07 ENCOUNTER — Other Ambulatory Visit (HOSPITAL_BASED_OUTPATIENT_CLINIC_OR_DEPARTMENT_OTHER): Payer: Self-pay

## 2024-05-07 ENCOUNTER — Other Ambulatory Visit: Payer: Self-pay | Admitting: Internal Medicine

## 2024-05-07 ENCOUNTER — Encounter: Payer: Self-pay | Admitting: Hematology & Oncology

## 2024-05-07 ENCOUNTER — Ambulatory Visit (INDEPENDENT_AMBULATORY_CARE_PROVIDER_SITE_OTHER): Admitting: Internal Medicine

## 2024-05-07 VITALS — BP 99/60 | HR 99 | Ht 62.0 in | Wt 149.6 lb

## 2024-05-07 VITALS — BP 100/50 | HR 104 | Ht 60.75 in | Wt 147.4 lb

## 2024-05-07 DIAGNOSIS — R112 Nausea with vomiting, unspecified: Secondary | ICD-10-CM

## 2024-05-07 DIAGNOSIS — K5900 Constipation, unspecified: Secondary | ICD-10-CM

## 2024-05-07 DIAGNOSIS — H1013 Acute atopic conjunctivitis, bilateral: Secondary | ICD-10-CM | POA: Diagnosis not present

## 2024-05-07 DIAGNOSIS — R32 Unspecified urinary incontinence: Secondary | ICD-10-CM | POA: Diagnosis not present

## 2024-05-07 DIAGNOSIS — Z8719 Personal history of other diseases of the digestive system: Secondary | ICD-10-CM

## 2024-05-07 DIAGNOSIS — F1190 Opioid use, unspecified, uncomplicated: Secondary | ICD-10-CM

## 2024-05-07 DIAGNOSIS — K219 Gastro-esophageal reflux disease without esophagitis: Secondary | ICD-10-CM | POA: Diagnosis not present

## 2024-05-07 DIAGNOSIS — Z124 Encounter for screening for malignant neoplasm of cervix: Secondary | ICD-10-CM | POA: Insufficient documentation

## 2024-05-07 DIAGNOSIS — K649 Unspecified hemorrhoids: Secondary | ICD-10-CM

## 2024-05-07 DIAGNOSIS — K449 Diaphragmatic hernia without obstruction or gangrene: Secondary | ICD-10-CM

## 2024-05-07 MED ORDER — AZELASTINE HCL 0.05 % OP SOLN
1.0000 [drp] | Freq: Two times a day (BID) | OPHTHALMIC | 1 refills | Status: DC
  Filled 2024-05-07: qty 6, 30d supply, fill #0
  Filled 2024-06-04: qty 6, 30d supply, fill #1

## 2024-05-07 MED ORDER — PANTOPRAZOLE SODIUM 40 MG PO TBEC: 40.0000 mg | DELAYED_RELEASE_TABLET | Freq: Every day | ORAL | 1 refills | Status: DC

## 2024-05-07 MED ORDER — LORATADINE 10 MG PO TABS
10.0000 mg | ORAL_TABLET | Freq: Every day | ORAL | 1 refills | Status: DC
  Filled 2024-05-07: qty 30, 30d supply, fill #0
  Filled 2024-06-04: qty 90, 90d supply, fill #0

## 2024-05-07 MED ORDER — NALOXEGOL OXALATE 25 MG PO TABS: 25.0000 mg | ORAL_TABLET | Freq: Every day | ORAL | 3 refills | Status: DC

## 2024-05-07 NOTE — Patient Instructions (Addendum)
 We have sent the following medications to your pharmacy for you to pick up at your convenience: Pantoprazole , Movantick  Follow up in 3 months _______________________________________________________  If your blood pressure at your visit was 140/90 or greater, please contact your primary care physician to follow up on this.  _______________________________________________________  If you are age 56 or older, your body mass index should be between 23-30. Your Body mass index is 28.08 kg/m. If this is out of the aforementioned range listed, please consider follow up with your Primary Care Provider.  If you are age 26 or younger, your body mass index should be between 19-25. Your Body mass index is 28.08 kg/m. If this is out of the aformentioned range listed, please consider follow up with your Primary Care Provider.   ________________________________________________________  The [facility] providers would like to encourage you to use MYCHART to communicate with providers for non-urgent requests or questions.  Due to long hold times on the telephone, sending your provider a message by [facility] may be a faster and more efficient way to get a response.  Please allow 48 business hours for a response.  Please remember that this is for non-urgent requests.  _______________________________________________________  Thank you for entrusting me with your care and for choosing [facility], Dr. Estefana Kidney

## 2024-05-07 NOTE — Progress Notes (Signed)
 Established patient visit   Patient: Ariana Herrera   DOB: [birth year]   58 y.o. Female  MRN: [id number] Visit Date: 05/07/2024  Today's healthcare provider: Manuelita Flatness, PA-C   Cc. Pap smear, incontinence pads  Subjective      Pt presents for cervical cancer screening. She also would like her incontinence supplies reordered. Occasional leakage w/ sneezing, coughing.    She also reports allergic eye symptoms, itching , tearing. She reports her eyedrops otc are not effective and she cannot afford claritin .  Medications: Outpatient Medications Prior to Visit  Medication Sig   ACCU-CHEK GUIDE test strip 3 (three) times daily.   albuterol  (ACCUNEB ) 1.25 MG/3ML nebulizer solution Take 1 ampule by nebulization every 6 (six) hours as needed for wheezing or shortness of breath.   albuterol  (VENTOLIN  HFA) 108 (90 Base) MCG/ACT inhaler Inhale 2 puffs into the lungs every 6 (six) hours as needed for wheezing or shortness of breath.   Continuous Glucose Sensor (DEXCOM G7 SENSOR) MISC Change sensor every 10 days   cyclobenzaprine  (FLEXERIL ) 5 MG tablet Take 5 mg by mouth 2 (two) times daily as needed for muscle spasms.   fenofibrate  (TRICOR ) 145 MG tablet Take 145 mg by mouth daily.   gabapentin  (NEURONTIN ) 300 MG capsule Take 1 capsule (300 mg total) by mouth 3 (three) times daily.   HYDROcodone -acetaminophen  (NORCO) 7.5-325 MG tablet Take 1 tablet by mouth every 6 (six) hours as needed for moderate pain (pain score 4-6).   hydrOXYzine (ATARAX) 25 MG tablet Take 25 mg by mouth 4 (four) times daily.   Insulin  Aspart FlexPen (NOVOLOG ) 100 UNIT/ML Inject into the skin.   Insulin  Disposable Pump (OMNIPOD 5 DEXG7G6 PODS GEN 5) MISC Apply one pod every 3 days to administer insulin  continuously.   lidocaine -prilocaine  (EMLA ) cream APPLY TOPICALLY 1 APPLICATION AS NEEDED   linaclotide  (LINZESS ) 290 MCG CAPS capsule Take 1 capsule (290 mcg total) by mouth daily before breakfast.   metFORMIN   (GLUCOPHAGE ) 1000 MG tablet Take 1,000 mg by mouth 2 (two) times daily with a meal.   miconazole  (MICOTIN) 200 MG vaginal suppository Place 1 suppository (200 mg total) vaginally at bedtime.   nitroGLYCERIN  (NITROSTAT ) 0.4 MG SL tablet Place 1 tablet (0.4 mg total) under the tongue every 5 (five) minutes as needed for chest pain.   NOVOLOG  100 UNIT/ML injection Inject into the skin as directed.   OLANZapine  (ZYPREXA ) 10 MG tablet TAKE 1 TABLET BY MOUTH EVERYDAY AT BEDTIME   ondansetron  (ZOFRAN ) 8 MG tablet Take 1 tablet (8 mg total) by mouth every 8 (eight) hours as needed for nausea or vomiting. START ON THE THIRD DAY AFTER CHEMOTHERAPY.   ondansetron  (ZOFRAN -ODT) 4 MG disintegrating tablet Take 1 tablet (4 mg total) by mouth every 8 (eight) hours.   oxybutynin  (DITROPAN -XL) 5 MG 24 hr tablet TAKE 1 TABLET BY MOUTH EVERYDAY AT BEDTIME   pantoprazole  (PROTONIX ) 40 MG tablet Take 40 mg by mouth daily.   polyethylene glycol (MIRALAX  / GLYCOLAX ) 17 g packet Take 17 g by mouth daily as needed.   prochlorperazine  (COMPAZINE ) 10 MG tablet Take 1 tablet (10 mg total) by mouth every 6 (six) hours as needed for nausea or vomiting (Zofran  resumes 12/16).   QUEtiapine  (SEROQUEL ) 400 MG tablet TAKE 2 TABLETS (800 MG TOTAL) BY MOUTH AT BEDTIME.   rivaroxaban  (XARELTO ) 10 MG TABS tablet Take 1 tablet (10 mg total) by mouth daily. (Patient not taking: Reported on 05/01/2024)   rosuvastatin  (CRESTOR ) 40  MG tablet Take 1 tablet (40 mg total) by mouth daily.   senna (SENOKOT) 8.6 MG TABS tablet Take 1 tablet (8.6 mg total) by mouth 2 (two) times daily.   talazoparib tosylate  (TALZENNA ) 0.5 MG capsule Take 1 capsule (0.5 mg total) by mouth daily.   Facility-Administered Medications Prior to Visit  Medication Dose Route Frequency Provider   sodium chloride  flush (NS) 0.9 % injection 10 mL  10 mL Intravenous PRN Timmy Maude SAUNDERS, MD    Review of Systems  Constitutional:  Negative for fatigue and fever.   Respiratory:  Negative for cough and shortness of breath.   Cardiovascular:  Negative for chest pain and leg swelling.  Gastrointestinal:  Negative for abdominal pain.  Genitourinary:        Incontinence   Neurological:  Negative for dizziness and headaches.       Objective    BP 99/60   Pulse 99   Ht 5' 2 (1.575 m)   Wt 149 lb 9.6 oz (67.9 kg)   LMP 10/04/2016 Comment: after procedure to stop bleeding  BMI 27.36 kg/m    Physical Exam Vitals reviewed.  Constitutional:      Appearance: She is not ill-appearing.  HENT:     Head: Normocephalic.  Eyes:     Conjunctiva/sclera: Conjunctivae normal.  Cardiovascular:     Rate and Rhythm: Normal rate.  Pulmonary:     Effort: Pulmonary effort is normal. No respiratory distress.  Genitourinary:    Labia:        Right: No rash or lesion.        Left: No rash or lesion.      Vagina: Normal.     Cervix: Discharge, friability and cervical bleeding present.     Comments: White discharge present at os.  Bleeding after sample taken Neurological:     Mental Status: She is alert and oriented to person, place, and time.  Psychiatric:        Mood and Affect: Mood normal.        Behavior: Behavior normal.      No results found for any visits on 05/07/24.  Assessment & Plan    Urinary incontinence, unspecified type Pt does not want referral to specialist.  Completed [facility] order form for incontinence pads  Allergic conjunctivitis of both eyes -     Azelastine  HCl; Place 1 drop into both eyes 2 (two) times daily.  Dispense: 6 mL; Refill: 1 -     Loratadine ; Take 1 tablet (10 mg total) by mouth daily.  Dispense: 90 tablet; Refill: 1  Cervical cancer screening -     Cytology - PAP   Return if symptoms worsen or fail to improve.       Manuelita Flatness, PA-C  [facility] at [facility] [phone] (phone) [phone] (fax)  [facility]

## 2024-05-07 NOTE — Progress Notes (Signed)
 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 She has RUQ ab pain when the constipation worsens. She is still on Linzess . She tried Miralax  and OTC suppositories on top of the Linzess , which were not effective. Endorses N&V. She has vomiting a couple of times per week. She is not on chemotherapy currently because she was recently found to have very low platelets. They are planning to do more labs and decide when to restart her chemotherapy. She does take hydrocodone  regularly. She is not having any bleeding issues. Reflux has been under good control on Protonix . Hemorrhoids do pop out when constipated.   Wt Readings from Last 3 Encounters:  05/07/24 147 lb 6 oz (66.8 kg)  05/07/24 149 lb 9.6 oz (67.9 kg)  05/01/24 147 lb 9.6 oz (67 kg)   Past Medical History:  Diagnosis Date   Anginal pain (HCC)    Anxiety    Arthritis    Asthma    Bipolar 1 disorder (HCC)    Bipolar disease, chronic (HCC)    Breast cancer in female (HCC)    2019 and recurrent in 2023 to right breast, right axillary, and left neck   Coronary artery disease    Deep vein blood clot of right lower extremity (HCC)    Diabetes mellitus type 2 in obese    Goals of care, counseling/discussion 11/23/2021   History of external beam radiation therapy    Left neck-10/07/23-11/24/23-[facility]   Hyperlipidemia    Lymphedema    MI (myocardial infarction) (HCC)    was in [place]    Obesity    Pancreatitis    Personal history of chemotherapy    Personal history of radiation therapy    Sleep apnea      Past Surgical History:  Procedure Laterality Date   BREAST BIOPSY Left  10/03/2023   times 2   BREAST LUMPECTOMY Left 03/2019   CATARACT EXTRACTION Bilateral    CHOLECYSTECTOMY     IR IMAGING GUIDED PORT INSERTION  12/15/2021   IR PATIENT EVAL TECH 0-60 MINS  10/06/2023   IR REMOVAL TUN ACCESS W/ PORT W/O FL MOD SED  12/20/2019   IR US  GUIDE BX ASP/DRAIN  11/02/2021   LEFT HEART CATH AND CORONARY ANGIOGRAPHY N/A 08/22/2020   Procedure: LEFT HEART CATH AND CORONARY ANGIOGRAPHY;  Surgeon: Verlin Lonni BIRCH, MD;  Location: [facility];  Service: Cardiovascular;  Laterality: N/A;   TRIGGER FINGER RELEASE Bilateral    x 5   TUBAL LIGATION     UMBILICAL HERNIA REPAIR     x 2   uterine ablation     Family History  Problem Relation Age of Onset   Heart attack Mother 83   Diabetes Mother    Breast cancer Mother    Breast cancer Cousin    Colon cancer Neg Hx    Esophageal cancer Neg Hx    Pancreatic cancer Neg Hx    Stomach cancer Neg Hx    Social History   Tobacco Use   Smoking status: Every Day    Current packs/day: 1.00    Average packs/day: 1 pack/day for 39.5 years (39.5 ttl pk-yrs)    Types: Cigarettes  Start date: 73   Smokeless tobacco: Never  Vaping Use   Vaping status: Never Used  Substance Use Topics   Alcohol use: Never   Drug use: Yes    Types: Marijuana    Comment: occasionally   Current Outpatient Medications  Medication Sig Dispense Refill   ACCU-CHEK GUIDE test strip 3 (three) times daily.     albuterol  (ACCUNEB ) 1.25 MG/3ML nebulizer solution Take 1 ampule by nebulization every 6 (six) hours as needed for wheezing or shortness of breath.     albuterol  (VENTOLIN  HFA) 108 (90 Base) MCG/ACT inhaler Inhale 2 puffs into the lungs every 6 (six) hours as needed for wheezing or shortness of breath. 18 g 2   azelastine  (OPTIVAR ) 0.05 % ophthalmic solution Place 1 drop into both eyes 2 (two) times daily. 6 mL 1   Continuous Glucose Sensor (DEXCOM G7 SENSOR) MISC Change sensor every 10 days     cyclobenzaprine  (FLEXERIL )  5 MG tablet Take 5 mg by mouth 2 (two) times daily as needed for muscle spasms.     fenofibrate  (TRICOR ) 145 MG tablet Take 145 mg by mouth daily.     gabapentin  (NEURONTIN ) 300 MG capsule Take 1 capsule (300 mg total) by mouth 3 (three) times daily. 90 capsule 1   HYDROcodone -acetaminophen  (NORCO) 7.5-325 MG tablet Take 1 tablet by mouth every 6 (six) hours as needed for moderate pain (pain score 4-6). 120 tablet 0   hydrOXYzine (ATARAX) 25 MG tablet Take 25 mg by mouth 4 (four) times daily.     Insulin  Aspart FlexPen (NOVOLOG ) 100 UNIT/ML Inject into the skin.     Insulin  Disposable Pump (OMNIPOD 5 DEXG7G6 PODS GEN 5) MISC Apply one pod every 3 days to administer insulin  continuously.     lidocaine -prilocaine  (EMLA ) cream APPLY TOPICALLY 1 APPLICATION AS NEEDED 30 g 5   linaclotide  (LINZESS ) 290 MCG CAPS capsule Take 1 capsule (290 mcg total) by mouth daily before breakfast. 30 capsule 3   loratadine  (CLARITIN ) 10 MG tablet Take 1 tablet (10 mg total) by mouth daily. 90 tablet 1   metFORMIN  (GLUCOPHAGE ) 1000 MG tablet Take 1,000 mg by mouth 2 (two) times daily with a meal.     miconazole  (MICOTIN) 200 MG vaginal suppository Place 1 suppository (200 mg total) vaginally at bedtime. 3 suppository 0   nitroGLYCERIN  (NITROSTAT ) 0.4 MG SL tablet Place 1 tablet (0.4 mg total) under the tongue every 5 (five) minutes as needed for chest pain. 25 tablet 3   NOVOLOG  100 UNIT/ML injection Inject into the skin as directed.     OLANZapine  (ZYPREXA ) 10 MG tablet TAKE 1 TABLET BY MOUTH EVERYDAY AT BEDTIME 90 tablet 2   ondansetron  (ZOFRAN ) 8 MG tablet Take 1 tablet (8 mg total) by mouth every 8 (eight) hours as needed for nausea or vomiting. START ON THE THIRD DAY AFTER CHEMOTHERAPY. 30 tablet 1   ondansetron  (ZOFRAN -ODT) 4 MG disintegrating tablet Take 1 tablet (4 mg total) by mouth every 8 (eight) hours. 90 tablet 1   oxybutynin  (DITROPAN -XL) 5 MG 24 hr tablet TAKE 1 TABLET BY MOUTH EVERYDAY AT BEDTIME 90  tablet 1   pantoprazole  (PROTONIX ) 40 MG tablet Take 40 mg by mouth daily.     polyethylene glycol (MIRALAX  / GLYCOLAX ) 17 g packet Take 17 g by mouth daily as needed. 14 each 0   prochlorperazine  (COMPAZINE ) 10 MG tablet Take 1 tablet (10 mg total) by mouth every 6 (six) hours as needed for nausea or vomiting (Zofran   resumes 12/16). 30 tablet 0   QUEtiapine  (SEROQUEL ) 400 MG tablet TAKE 2 TABLETS (800 MG TOTAL) BY MOUTH AT BEDTIME. 180 tablet 2   rivaroxaban  (XARELTO ) 10 MG TABS tablet Take 1 tablet (10 mg total) by mouth daily. 30 tablet 6   rosuvastatin  (CRESTOR ) 40 MG tablet Take 1 tablet (40 mg total) by mouth daily. 90 tablet 3   senna (SENOKOT) 8.6 MG TABS tablet Take 1 tablet (8.6 mg total) by mouth 2 (two) times daily. 120 tablet 0   talazoparib tosylate  (TALZENNA ) 0.5 MG capsule Take 1 capsule (0.5 mg total) by mouth daily. 30 capsule 2   No current facility-administered medications for this visit.   Facility-Administered Medications Ordered in Other Visits  Medication Dose Route Frequency Provider Last Rate Last Admin   sodium chloride  flush (NS) 0.9 % injection 10 mL  10 mL Intravenous PRN Timmy Maude SAUNDERS, MD   10 mL at 08/25/23 1455   Allergies  Allergen Reactions   Lithium Other (See Comments)    Spinal fluid built up in brain   Dulaglutide  Nausea And Vomiting and Other (See Comments)    TRULICITY    Nitrofuran Derivatives Itching    Nitrofurantoin  Mono- MCR   Penicillins Other (See Comments)    UNKNOWN CHILDHOOD REACTION   Physical Exam: BP (!) 100/50 (BP Location: Right Arm, Patient Position: Sitting)   Pulse (!) 104   Ht 5' 0.75 (1.543 m) Comment: height measured without shoes  Wt 147 lb 6 oz (66.8 kg)   LMP 10/04/2016 Comment: after procedure to stop bleeding  BMI 28.08 kg/m  Constitutional: Pleasant,well-developed, female in no acute distress. HEENT: Normocephalic and atraumatic. Conjunctivae are normal. No scleral icterus. Cardiovascular: Normal  rate Pulmonary/chest: Effort normal and breath sounds normal. No wheezing, rales or rhonchi. Abdominal: Soft, nondistended, nontender. Bowel sounds active throughout. There are no masses palpable. No hepatomegaly. Extremities: No edema Neurological: Alert and oriented to person place and time. Skin: Skin is warm and dry. No rashes noted. Psychiatric: Normal mood and affect. Behavior is normal.  Labs 01/2023: CBC nml. CMP with elevated glucose of 253 and elevated Cr of 1.08.   Labs 10/2023: CBC with low WBC of 2.8, low Hb of 10.6, and low plts of 149. CMP with mildly low sodium of 133 and elevated glucose of 196.  Labs 04/2024: CBC with leukopenia, anemia, and thrombocytopenia with low plts of 16  CT A/P 08/14/20: IMPRESSION: Vascular findings: 1. Negative for aortic aneurysm or dissection. 2. Small wedge-shaped areas of decreased enhancement within the inferior poles of the right kidney and left kidney, concerning for areas of focal renal infarction. Pyelonephritis could have a similar appearance. Correlation with urinalysis is recommended. 3. Focal 11 x 5 mm area of noncalcified atherosclerotic plaque with a slightly polypoid configuration projecting into the lumen of the distal descending thoracic aorta. This may be at risk for potential plaque thrombosis. Nonvascular findings: 1. Findings of acute uncomplicated pancreatitis. No evidence of pancreatic necrosis or peripancreatic fluid collection. Correlate with serum lipase. 2. Findings suggestive of hepatic steatosis. 3. Indeterminate 1.8 cm rounded area of hypoattenuation within the anterior aspect of the spleen. Further evaluation with contrast-enhanced MRI can be performed on a nonemergent basis. 4. Slightly mottled appearance of the left third rib anteriorly with some adjacent scarring in the adjacent lung, potentially representing post radiation changes. 5. Left breast skin thickening with suspected lumpectomy site at the inner left breast.  Continued mammographic follow-up is recommended.    PET CT 11/29/22: IMPRESSION:  1. No evidence breast cancer recurrence on FDG PET scan. 2. Postradiation change in the LEFT upper lobe is stable. 3. Stable small nodule in the RIGHT upper lobe. 4. No skeletal metastasis  CT C/A/P w/contrast 10/06/23: IMPRESSION: 1. Possible postsurgical changes in the left supraclavicular area, similar to PET-CT of 2 months ago but new from 03/04/2023. There was no significant hypermetabolic activity in these areas at recent PET-CT, although metastatic disease is a concern, especially if there has been no prior surgery in this area. The patient did present for ultrasound with a palpable abnormality in this region 3 months ago. Consider dedicated CT of the neck or follow up PET-CT. 2. Postsurgical changes in the left breast, grossly stable. 3. No evidence of distant metastatic disease in the chest, abdomen or pelvis. 4. Prominent stool throughout the colon suggesting constipation. 5.  Aortic Atherosclerosis (ICD10-I70.0).  EGD 03/22/23: - Normal esophagus.  - Hiatal hernia.  - Small amount of bilious gastric fluid.  - Erythematous mucosa in the antrum. Biopsied.  - Duodenal mucosal lymphangiectasia. Biopsied. Path: 1. Surgical [P], small bowel DUODENAL MUCOSA WITH NORMAL VILLOUS ARCHITECTURE. NO VILLOUS ATROPHY OR INCREASED INTRAEPITHELIAL LYMPHOCYTES. 2. Surgical [P], gastric ANTRAL AND OXYNTIC MUCOSA WITH MILD CHANGES OF REACTIVE GASTROPATHY. NEGATIVE FOR HELICOBACTER PYLORI.  ASSESSMENT AND PLAN: Constipation Opioid use N&V GERD Hiatal hernia Hemorrhoids History of pancreatitis attributed to Trulicity  Patient continues to describe significant issues with constipation. Linzess  with OTC laxatives has not been effective. Thus will try to get her started on Movantik  since patient is on opioid therapy as well. He reflux is responding well to PPI therapy. Patient's nausea and vomiting has been  previously thought to be multifactorial due to GERD, constipation, blood sugar fluctuations, marijuana use, and/or gastroparesis. Zofran  does help to keep her symptoms under control. - Previously gave GERD handout - Continue Linzess  290 mcg every day - Start Movantik  25 mg every day  - Continue PPI BID. Refill - Continue Zofran  PRN ODT version. - Avoid marijuana use - Next colonoscopy for colon cancer screening due in 2027 - RTC in 3 months  Estefana Kidney, MD  I spent 32 minutes of time, including in depth chart review, independent review of results as outlined above, communicating results with the patient directly, face-to-face time with the patient, coordinating care, ordering studies and medications as appropriate, and documentation.

## 2024-05-08 ENCOUNTER — Other Ambulatory Visit (HOSPITAL_COMMUNITY): Payer: Self-pay

## 2024-05-08 ENCOUNTER — Inpatient Hospital Stay

## 2024-05-08 ENCOUNTER — Other Ambulatory Visit: Payer: Self-pay

## 2024-05-08 ENCOUNTER — Telehealth: Payer: Self-pay

## 2024-05-08 ENCOUNTER — Ambulatory Visit: Payer: Self-pay | Admitting: Medical Oncology

## 2024-05-08 ENCOUNTER — Encounter: Payer: Self-pay | Admitting: Medical Oncology

## 2024-05-08 ENCOUNTER — Inpatient Hospital Stay (HOSPITAL_BASED_OUTPATIENT_CLINIC_OR_DEPARTMENT_OTHER): Admitting: Medical Oncology

## 2024-05-08 ENCOUNTER — Other Ambulatory Visit (HOSPITAL_BASED_OUTPATIENT_CLINIC_OR_DEPARTMENT_OTHER): Payer: Self-pay

## 2024-05-08 VITALS — BP 109/50 | HR 90 | Temp 98.3°F | Resp 17 | Wt 151.1 lb

## 2024-05-08 DIAGNOSIS — D6481 Anemia due to antineoplastic chemotherapy: Secondary | ICD-10-CM | POA: Diagnosis not present

## 2024-05-08 DIAGNOSIS — C770 Secondary and unspecified malignant neoplasm of lymph nodes of head, face and neck: Secondary | ICD-10-CM

## 2024-05-08 DIAGNOSIS — C50011 Malignant neoplasm of nipple and areola, right female breast: Secondary | ICD-10-CM

## 2024-05-08 DIAGNOSIS — Z86718 Personal history of other venous thrombosis and embolism: Secondary | ICD-10-CM

## 2024-05-08 DIAGNOSIS — D649 Anemia, unspecified: Secondary | ICD-10-CM

## 2024-05-08 DIAGNOSIS — D6959 Other secondary thrombocytopenia: Secondary | ICD-10-CM

## 2024-05-08 DIAGNOSIS — Z95828 Presence of other vascular implants and grafts: Secondary | ICD-10-CM

## 2024-05-08 DIAGNOSIS — T451X5A Adverse effect of antineoplastic and immunosuppressive drugs, initial encounter: Secondary | ICD-10-CM

## 2024-05-08 DIAGNOSIS — D701 Agranulocytosis secondary to cancer chemotherapy: Secondary | ICD-10-CM | POA: Diagnosis not present

## 2024-05-08 LAB — CBC WITH DIFFERENTIAL (CANCER CENTER ONLY)
Abs Immature Granulocytes: 0.05 K/uL (ref 0.00–0.07)
Basophils Absolute: 0 K/uL (ref 0.0–0.1)
Basophils Relative: 0 %
Eosinophils Absolute: 0 K/uL (ref 0.0–0.5)
Eosinophils Relative: 1 %
HCT: 29.4 % — ABNORMAL LOW (ref 36.0–46.0)
Hemoglobin: 10.1 g/dL — ABNORMAL LOW (ref 12.0–15.0)
Immature Granulocytes: 1 %
Lymphocytes Relative: 26 %
Lymphs Abs: 1.1 K/uL (ref 0.7–4.0)
MCH: 34 pg (ref 26.0–34.0)
MCHC: 34.4 g/dL (ref 30.0–36.0)
MCV: 99 fL (ref 80.0–100.0)
Monocytes Absolute: 0.5 K/uL (ref 0.1–1.0)
Monocytes Relative: 13 %
Neutro Abs: 2.3 K/uL (ref 1.7–7.7)
Neutrophils Relative %: 59 %
Platelet Count: 147 K/uL — ABNORMAL LOW (ref 150–400)
RBC: 2.97 MIL/uL — ABNORMAL LOW (ref 3.87–5.11)
RDW: 19.7 % — ABNORMAL HIGH (ref 11.5–15.5)
WBC Count: 4 K/uL (ref 4.0–10.5)
nRBC: 0 % (ref 0.0–0.2)

## 2024-05-08 LAB — VITAMIN B12: Vitamin B-12: 325 pg/mL (ref 180–914)

## 2024-05-08 LAB — SAMPLE TO BLOOD BANK

## 2024-05-08 MED ORDER — SODIUM CHLORIDE 0.9% FLUSH
10.0000 mL | Freq: Once | INTRAVENOUS | Status: AC
  Administered 2024-05-08: 10 mL

## 2024-05-08 MED ORDER — TALAZOPARIB TOSYLATE 0.35 MG PO CAPS
0.3500 mg | ORAL_CAPSULE | Freq: Every day | ORAL | 6 refills | Status: DC
Start: 2024-05-08 — End: 2024-08-13

## 2024-05-08 MED ORDER — LIDOCAINE-PRILOCAINE 2.5-2.5 % EX CREA
1.0000 | TOPICAL_CREAM | Freq: Every day | CUTANEOUS | 5 refills | Status: DC | PRN
  Filled 2024-05-08: qty 30, 1d supply, fill #0

## 2024-05-08 MED ORDER — HEPARIN SOD (PORK) LOCK FLUSH 100 UNIT/ML IV SOLN
500.0000 [IU] | Freq: Once | INTRAVENOUS | Status: AC
  Administered 2024-05-08: 500 [IU] via INTRAVENOUS

## 2024-05-08 NOTE — Patient Instructions (Signed)

## 2024-05-08 NOTE — Progress Notes (Signed)
 This is Hematology and Oncology Follow Up Visit  Ariana Herrera [id number] 11-30-1965 58 y.o. 05/08/2024   Principle Diagnosis:  Stage IIA (T2N0M) infiltrating ductal carcinoma of the left breast-TRIPLE NEGATIVE-recurrent -- HRD (+) LEFT internal jugular thrombus B12 deficiency    Current Therapy:        Carbo/Gemzar /Pembrolizumab  -- s/p cycle 6-- start on 11/27/2021 --DC on 06/10/2022 due to none tolerance Trodelvy  -- s/p cycle #4 - start on 06/23/2022 -- omitting day #8 -- started on 09/08/2022 --DC on 10/28/2022 --patient request CDDP/5-FU + XRT -- s/p cycle #1 - start on 10/07/2023 --completed on 11/24/2023 Lovenox  80 mg SQ BID -DC on 01/18/2024 Xarelto  20 mg p.o. daily-start on 01/18/2024 Talazoparib 1 mg p.o. daily-start on 12/22/2023 -  changed to 0.50 mg po q day on 03/06/2024 B12 oral 1,000 mcg once daily    Interim History:  Ariana Herrera is here today for follow-up.  We had to decrease her Talazoparib to 0.5 mg p.o. daily due to pancytopenia. She was also found to have B12 deficiency   Today she states that she has been feeling better each day.   She is taking her B12 supplement as directed  MRI of the brain on 03/02/2024.  This did not show any evidence of metastatic disease.  She also had a CT of the neck.  This was done on 03/05/2024.  This showed resolution of her adenopathy.  She is still smoking.  She is cut back on smoking.  She smokes about half pack per day.  Her last CA 27.29 was down to 14.8 from 18.9 1 month prior   Insulin  pump is going well.   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 There has been no bleeding to her knowledge: denies epistaxis, gingivitis, hemoptysis, hematemesis, hematuria, melena, excessive bruising, blood donation.   She has had no change in bowel or bladder habits.  She has had no leg swelling.  There is been no rashes.  She has had no fever.  Overall, I would say that her  performance status is probably ECOG 1.     Wt Readings from Last 3 Encounters:  05/08/24 151 lb 1.8 oz (68.5 kg)  05/07/24 147 lb 6 oz (66.8 kg)  05/07/24 149 lb 9.6 oz (67.9 kg)    Medications:  Allergies as of 05/08/2024       Reactions   Lithium Other (See Comments)   Spinal fluid built up in brain   Dulaglutide  Nausea And Vomiting, Other (See Comments)   TRULICITY    Nitrofuran Derivatives Itching   Nitrofurantoin  Mono- MCR   Penicillins Other (See Comments)   UNKNOWN CHILDHOOD REACTION        Medication List        Accurate as of May 08, 2024 11:05 AM. If you have any questions, ask your nurse or doctor.          Accu-Chek Guide test strip Generic drug: glucose blood 3 (three) times daily.   albuterol  1.25 MG/3ML nebulizer solution Commonly known as: ACCUNEB  Take 1 ampule by nebulization every 6 (six) hours as needed for wheezing or shortness of breath.   albuterol  108 (90 Base) MCG/ACT inhaler Commonly known as: VENTOLIN  HFA Inhale 2 puffs into the lungs every 6 (six) hours as needed for wheezing or shortness of breath.   azelastine  0.05 % ophthalmic solution Commonly known as: OPTIVAR  Place 1 drop into both eyes 2 (two) times daily.  cyclobenzaprine  5 MG tablet Commonly known as: FLEXERIL  Take 5 mg by mouth 2 (two) times daily as needed for muscle spasms.   Dexcom G7 Sensor Misc Change sensor every 10 days   fenofibrate  145 MG tablet Commonly known as: TRICOR  Take 145 mg by mouth daily.   gabapentin  300 MG capsule Commonly known as: NEURONTIN  Take 1 capsule (300 mg total) by mouth 3 (three) times daily.   HYDROcodone -acetaminophen  7.5-325 MG tablet Commonly known as: NORCO Take 1 tablet by mouth every 6 (six) hours as needed for moderate pain (pain score 4-6).   hydrOXYzine 25 MG tablet Commonly known as: ATARAX Take 25 mg by mouth 4 (four) times daily.   lidocaine -prilocaine  cream Commonly known as: EMLA  Apply 1 Application topically  daily as needed. What changed: See the new instructions. Changed by: Ariana Herrera   linaclotide  290 MCG Caps capsule Commonly known as: Linzess  Take 1 capsule (290 mcg total) by mouth daily before breakfast.   loratadine  10 MG tablet Commonly known as: CLARITIN  Take 1 tablet (10 mg total) by mouth daily.   metFORMIN  1000 MG tablet Commonly known as: GLUCOPHAGE  Take 1,000 mg by mouth 2 (two) times daily with a meal.   miconazole  200 MG vaginal suppository Commonly known as: MICOTIN Place 1 suppository (200 mg total) vaginally at bedtime.   naloxegol  oxalate 25 MG Tabs tablet Commonly known as: Movantik  Take 1 tablet (25 mg total) by mouth daily.   nitroGLYCERIN  0.4 MG SL tablet Commonly known as: NITROSTAT  Place 1 tablet (0.4 mg total) under the tongue every 5 (five) minutes as needed for chest pain.   NovoLOG  100 UNIT/ML injection Generic drug: insulin  aspart Inject into the skin as directed.   Insulin  Aspart FlexPen 100 UNIT/ML Commonly known as: NOVOLOG  Inject into the skin.   OLANZapine  10 MG tablet Commonly known as: ZYPREXA  TAKE 1 TABLET BY MOUTH EVERYDAY AT BEDTIME   Omnipod 5 DexG7G6 Pods Gen 5 Misc Apply one pod every 3 days to administer insulin  continuously.   ondansetron  4 MG disintegrating tablet Commonly known as: ZOFRAN -ODT Take 1 tablet (4 mg total) by mouth every 8 (eight) hours.   ondansetron  8 MG tablet Commonly known as: ZOFRAN  Take 1 tablet (8 mg total) by mouth every 8 (eight) hours as needed for nausea or vomiting. START ON THE THIRD DAY AFTER CHEMOTHERAPY.   oxybutynin  5 MG 24 hr tablet Commonly known as: DITROPAN -XL TAKE 1 TABLET BY MOUTH EVERYDAY AT BEDTIME   pantoprazole  40 MG tablet Commonly known as: PROTONIX  Take 1 tablet (40 mg total) by mouth daily.   PEG 3350  17 g Pack Take 17 g by mouth daily as needed.   prochlorperazine  10 MG tablet Commonly known as: COMPAZINE  Take 1 tablet (10 mg total) by mouth every 6 (six)  hours as needed for nausea or vomiting (Zofran  resumes 12/16).   QUEtiapine  400 MG tablet Commonly known as: SEROQUEL  TAKE 2 TABLETS (800 MG TOTAL) BY MOUTH AT BEDTIME.   rivaroxaban  10 MG Tabs tablet Commonly known as: XARELTO  Take 1 tablet (10 mg total) by mouth daily.   rosuvastatin  40 MG tablet Commonly known as: CRESTOR  Take 1 tablet (40 mg total) by mouth daily.   senna 8.6 MG Tabs tablet Commonly known as: SENOKOT Take 1 tablet (8.6 mg total) by mouth 2 (two) times daily.   talazoparib tosylate  0.5 MG capsule Commonly known as: Talzenna  Take 1 capsule (0.5 mg total) by mouth daily.        Allergies:  Allergies  Allergen Reactions   Lithium Other (See Comments)    Spinal fluid built up in brain   Dulaglutide  Nausea And Vomiting and Other (See Comments)    TRULICITY    Nitrofuran Derivatives Itching    Nitrofurantoin  Mono- MCR   Penicillins Other (See Comments)    UNKNOWN CHILDHOOD REACTION    Past Medical History, Surgical history, Social history, and Family History were reviewed and updated.  Review of Systems: Review of Systems  Constitutional:  Negative for malaise/fatigue.  HENT: Negative.    Eyes: Negative.   Respiratory: Negative.    Cardiovascular:  Negative for leg swelling.  Gastrointestinal:  Negative for nausea.  Genitourinary: Negative.   Musculoskeletal:  Positive for joint pain and myalgias.  Skin: Negative.   Neurological:  Negative for tingling.  Endo/Heme/Allergies: Negative.   Psychiatric/Behavioral: Negative.       Physical Exam:  weight is 151 lb 1.8 oz (68.5 kg). Her oral temperature is 98.3 F (36.8 C). Her blood pressure is 109/50 (abnormal) and her pulse is 90. Her respiration is 17 and oxygen saturation is 100%.   Wt Readings from Last 3 Encounters:  05/08/24 151 lb 1.8 oz (68.5 kg)  05/07/24 147 lb 6 oz (66.8 kg)  05/07/24 149 lb 9.6 oz (67.9 kg)    Physical Exam Vitals reviewed.  HENT:     Head: Normocephalic and  atraumatic.     Comments: She does not have any swelling now.  I do not feel any firmness on the left side of her neck.  No adenopathy is noted.  She has no intraoral lesions.  Eyes:     Pupils: Pupils are equal, round, and reactive to light.  Cardiovascular:     Rate and Rhythm: Normal rate and regular rhythm.     Heart sounds: Normal heart sounds.  Pulmonary:     Effort: Pulmonary effort is normal.     Breath sounds: Normal breath sounds.  Abdominal:     General: Bowel sounds are normal. There is no distension.     Palpations: Abdomen is soft.     Tenderness: There is no abdominal tenderness. There is no guarding.  Musculoskeletal:        General: No deformity. Normal range of motion.     Cervical back: Normal range of motion.  Lymphadenopathy:     Cervical: No cervical adenopathy.  Skin:    General: Skin is warm and dry.     Coloration: Skin is not jaundiced or pale.     Findings: No bruising, erythema or rash.  Neurological:     Mental Status: She is alert and oriented to person, place, and time.  Psychiatric:        Behavior: Behavior normal.        Thought Content: Thought content normal.        Judgment: Judgment normal.     Lab Results  Component Value Date   WBC 4.0 05/08/2024   HGB 10.1 (L) 05/08/2024   HCT 29.4 (L) 05/08/2024   MCV 99.0 05/08/2024   PLT 147 (L) 05/08/2024   Lab Results  Component Value Date   FERRITIN 168 04/24/2024   IRON 82 03/06/2024   TIBC 314 03/06/2024   UIBC 232 03/06/2024   IRONPCTSAT 26 03/06/2024   Lab Results  Component Value Date   RETICCTPCT 1.0 04/24/2024   RBC 2.97 (L) 05/08/2024   No results found for: KPAFRELGTCHN, LAMBDASER, KAPLAMBRATIO No results found for: IGGSERUM, IGA, IGMSERUM No results found for:  STEPHANY CARLOTA BENSON MARKEL EARLA JOANNIE DOC VICK, SPEI   Chemistry      Component Value Date/Time   NA 136 04/24/2024 0935   K 3.4 (L) 04/24/2024 0935   CL 103  04/24/2024 0935   CO2 24 04/24/2024 0935   BUN 17 04/24/2024 0935   CREATININE 1.12 (H) 04/24/2024 0935      Component Value Date/Time   CALCIUM  9.2 04/24/2024 0935   ALKPHOS 74 04/24/2024 0935   AST 16 04/24/2024 0935   ALT 10 04/24/2024 0935   BILITOT 0.3 04/24/2024 0935     Encounter Diagnoses  Name Primary?   Malignant neoplasm of nipple of right breast in female, unspecified estrogen receptor status (HCC) Yes   Chemotherapy-induced thrombocytopenia    Chemotherapy induced neutropenia (HCC)    Antineoplastic chemotherapy induced anemia    Secondary malignant neoplasm of cervical lymph node (HCC)    Port-A-Cath in place    History of DVT (deep vein thrombosis)      Impression and Plan: Ms. Bencosme is a very pleasant  58 yo caucasian female with recurrent ductal carcinoma of the left breast.  Recurrence was in the neck. She was treated with incredibly aggressive chemo radiation therapy.  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  Again this worked very very nicely. She is now on dose reduced Talzenna .   So happy to see that her counts have recovered nicely.  She will restart the Talzenna  (dose adjusted down to 0.35 mg once daily) along with restarting her Xarelto   De-access today- no blood needed tomorrow  RTC next friday APP, port labs(CBC, type cross)+_ platelets   Ariana CHRISTELLA Dais, PA-C 7/15/202511:05 AM

## 2024-05-08 NOTE — Telephone Encounter (Signed)
 Pharmacy Patient Advocate Encounter   Received notification from RX Request Messages that prior authorization for MOVANTIK  25MG  tablets is required/requested.   Insurance verification completed.   The patient is insured through [facility] .   Per test claim: PA required; PA submitted to above mentioned insurance via CoverMyMeds Key/confirmation #/EOC [id number] Status is pending

## 2024-05-08 NOTE — Telephone Encounter (Signed)
 PA request has been Submitted. New Encounter has been or will be created for follow up. For additional info see Pharmacy Prior Auth telephone encounter from 05-08-2024.

## 2024-05-09 ENCOUNTER — Telehealth: Payer: Self-pay | Admitting: Pharmacy Technician

## 2024-05-09 ENCOUNTER — Ambulatory Visit: Payer: Self-pay | Admitting: Physician Assistant

## 2024-05-09 LAB — CYTOLOGY - PAP
Comment: NEGATIVE
Diagnosis: NEGATIVE
High risk HPV: NEGATIVE

## 2024-05-09 NOTE — Telephone Encounter (Signed)
 Oral Oncology Patient Advocate Encounter  I called [employer] to check on status of patient's medication since her dose changed.  [employer] representative stated that it is almost ready to ship and that the copay was $4.  If needed oral chemo team will check in with patient at a later time to make sure medication was delivered appropriately.  Gelila Well (Patty) Chet Burnet, CPhT  [facility], [place], [place], [place] Oral Chemotherapy Patient Advocate Phone: [phone]  Fax: [phone]

## 2024-05-10 ENCOUNTER — Telehealth: Payer: Self-pay | Admitting: Medical Oncology

## 2024-05-10 NOTE — Telephone Encounter (Signed)
 Lvm to return call for scheduling per 05/08/24 ov.

## 2024-05-10 NOTE — Telephone Encounter (Signed)
 Pharmacy Patient Advocate Encounter  Received notification from [facility] that Prior Authorization for MOVANTIK  25MG  tablets has been DENIED.  Full denial letter will be uploaded to the media tab. See denial reason below.  Here are the policy requirements your request did not meet:  Per your health plan's criteria, this drug is covered if you meet the following;  One of the following:  (1) You have failed one preferred drug as confirmed by claims history or submission of medical records. The preferred drugs; lubiprostone capsule (generic for Amitiza) or Amitiza capsule.  (2) You cannot use one preferred drug (please specify contraindication or intolerance).   PA #/Case ID/Reference #: [id number]

## 2024-05-11 ENCOUNTER — Other Ambulatory Visit (HOSPITAL_BASED_OUTPATIENT_CLINIC_OR_DEPARTMENT_OTHER): Payer: Self-pay

## 2024-05-11 ENCOUNTER — Other Ambulatory Visit: Payer: Self-pay | Admitting: Hematology & Oncology

## 2024-05-11 MED ORDER — LUBIPROSTONE 24 MCG PO CAPS: 24.0000 ug | ORAL_CAPSULE | Freq: Two times a day (BID) | ORAL | 3 refills | Status: AC

## 2024-05-11 MED ORDER — HYDROCODONE-ACETAMINOPHEN 7.5-325 MG PO TABS
1.0000 | ORAL_TABLET | Freq: Four times a day (QID) | ORAL | 0 refills | Status: DC | PRN
  Filled 2024-05-11: qty 120, 30d supply, fill #0

## 2024-05-11 NOTE — Telephone Encounter (Signed)
 LVM and mychart message. Medication sent to pharmacy and dx movantik  at pharmacy

## 2024-05-11 NOTE — Telephone Encounter (Signed)
 Dr Federico please advise the medication PA has been denied

## 2024-05-14 ENCOUNTER — Encounter: Payer: Self-pay | Admitting: *Deleted

## 2024-05-15 DIAGNOSIS — R000 Tachycardia, unspecified: Secondary | ICD-10-CM | POA: Diagnosis not present

## 2024-05-16 ENCOUNTER — Ambulatory Visit: Payer: Self-pay | Admitting: Cardiology

## 2024-05-18 NOTE — Telephone Encounter (Signed)
 Called patient advised of below they verbalized understanding.

## 2024-05-18 NOTE — Telephone Encounter (Signed)
-----   Message from Rollo FABIENE Louder sent at 05/16/2024  1:12 PM EDT ----- Please tell patient that her recent cardiac monitor showed predominantly normal sinus rhythm with an average HR of 93 BPM. There were rare PVCs and PACs (extra beats, not dangerous). However, she did  seem to feel some of these extra beats. To prevent PVCs, make sure she is staying hydrated and avoiding excessive caffeine intake  Thanks KJ  ----- Message ----- From: Mona Vinie BROCKS, MD Sent: 05/15/2024   8:15 AM EDT To: Rollo JONELLE Louder, PA-C

## 2024-05-28 ENCOUNTER — Other Ambulatory Visit (HOSPITAL_BASED_OUTPATIENT_CLINIC_OR_DEPARTMENT_OTHER): Payer: Self-pay

## 2024-05-28 ENCOUNTER — Telehealth: Payer: Self-pay | Admitting: Medical Oncology

## 2024-05-28 ENCOUNTER — Encounter: Payer: Self-pay | Admitting: Hematology & Oncology

## 2024-05-28 MED ORDER — OMNIPOD 5 DEXG7G6 PODS GEN 5 MISC
11 refills | Status: AC
  Filled 2024-05-28: qty 10, 30d supply, fill #0
  Filled 2024-06-04 – 2024-06-20 (×2): qty 10, 30d supply, fill #1
  Filled 2024-09-03 – 2024-09-05 (×3): qty 10, 30d supply, fill #2
  Filled 2024-09-30 – 2024-10-05 (×2): qty 10, 30d supply, fill #3
  Filled 2024-10-30 – 2024-11-05 (×2): qty 10, 30d supply, fill #4
  Filled 2024-11-30: qty 10, 30d supply, fill #5
  Filled ????-??-??: fill #5

## 2024-05-28 NOTE — Telephone Encounter (Signed)
 Called lvm for patient to return call for scheduling appt this week for lab, port, sarah and +/- blood 2 hr.

## 2024-05-29 ENCOUNTER — Other Ambulatory Visit: Payer: Self-pay

## 2024-05-29 ENCOUNTER — Other Ambulatory Visit (HOSPITAL_BASED_OUTPATIENT_CLINIC_OR_DEPARTMENT_OTHER): Payer: Self-pay

## 2024-05-29 MED ORDER — METFORMIN HCL 1000 MG PO TABS
1000.0000 mg | ORAL_TABLET | Freq: Two times a day (BID) | ORAL | 3 refills | Status: AC
  Filled 2024-05-29: qty 180, 90d supply, fill #0

## 2024-05-29 MED ORDER — OMNIPOD 5 DEXG7G6 PODS GEN 5 MISC
1.0000 | 11 refills | Status: DC
  Filled 2024-05-29: qty 10, 30d supply, fill #0
  Filled 2024-06-04: qty 10, fill #0
  Filled 2024-06-07 – 2024-06-09 (×2): qty 10, 30d supply, fill #0

## 2024-05-29 MED ORDER — INSULIN ASPART 100 UNIT/ML IJ SOLN
100.0000 [IU] | Freq: Every day | INTRAMUSCULAR | 5 refills | Status: AC
  Filled 2024-05-29: qty 40, 40d supply, fill #0
  Filled 2024-08-08: qty 40, 40d supply, fill #1
  Filled 2024-09-02 – 2024-09-30 (×2): qty 40, 40d supply, fill #2
  Filled 2024-11-08: qty 40, 40d supply, fill #3

## 2024-05-30 ENCOUNTER — Inpatient Hospital Stay

## 2024-05-30 ENCOUNTER — Encounter: Payer: Self-pay | Admitting: Medical Oncology

## 2024-05-30 ENCOUNTER — Inpatient Hospital Stay (HOSPITAL_BASED_OUTPATIENT_CLINIC_OR_DEPARTMENT_OTHER): Admitting: Medical Oncology

## 2024-05-30 ENCOUNTER — Inpatient Hospital Stay: Attending: Hematology & Oncology

## 2024-05-30 ENCOUNTER — Encounter: Payer: Self-pay | Admitting: *Deleted

## 2024-05-30 ENCOUNTER — Other Ambulatory Visit (HOSPITAL_BASED_OUTPATIENT_CLINIC_OR_DEPARTMENT_OTHER): Payer: Self-pay

## 2024-05-30 VITALS — BP 95/74 | HR 102 | Temp 98.2°F | Resp 18 | Ht 60.0 in | Wt 141.0 lb

## 2024-05-30 DIAGNOSIS — D6481 Anemia due to antineoplastic chemotherapy: Secondary | ICD-10-CM

## 2024-05-30 DIAGNOSIS — C50011 Malignant neoplasm of nipple and areola, right female breast: Secondary | ICD-10-CM | POA: Insufficient documentation

## 2024-05-30 DIAGNOSIS — C50019 Malignant neoplasm of nipple and areola, unspecified female breast: Secondary | ICD-10-CM

## 2024-05-30 DIAGNOSIS — Z17421 Hormone receptor negative with human epidermal growth factor receptor 2 negative status: Secondary | ICD-10-CM | POA: Insufficient documentation

## 2024-05-30 DIAGNOSIS — Z86718 Personal history of other venous thrombosis and embolism: Secondary | ICD-10-CM

## 2024-05-30 DIAGNOSIS — Z95828 Presence of other vascular implants and grafts: Secondary | ICD-10-CM

## 2024-05-30 DIAGNOSIS — D701 Agranulocytosis secondary to cancer chemotherapy: Secondary | ICD-10-CM | POA: Diagnosis not present

## 2024-05-30 DIAGNOSIS — D6959 Other secondary thrombocytopenia: Secondary | ICD-10-CM | POA: Diagnosis not present

## 2024-05-30 DIAGNOSIS — T451X5A Adverse effect of antineoplastic and immunosuppressive drugs, initial encounter: Secondary | ICD-10-CM

## 2024-05-30 DIAGNOSIS — C7989 Secondary malignant neoplasm of other specified sites: Secondary | ICD-10-CM | POA: Diagnosis present

## 2024-05-30 DIAGNOSIS — F17210 Nicotine dependence, cigarettes, uncomplicated: Secondary | ICD-10-CM | POA: Diagnosis not present

## 2024-05-30 DIAGNOSIS — M542 Cervicalgia: Secondary | ICD-10-CM

## 2024-05-30 DIAGNOSIS — C770 Secondary and unspecified malignant neoplasm of lymph nodes of head, face and neck: Secondary | ICD-10-CM

## 2024-05-30 LAB — SAMPLE TO BLOOD BANK

## 2024-05-30 LAB — CBC WITH DIFFERENTIAL (CANCER CENTER ONLY)
Abs Immature Granulocytes: 0.06 K/uL (ref 0.00–0.07)
Basophils Absolute: 0 K/uL (ref 0.0–0.1)
Basophils Relative: 1 %
Eosinophils Absolute: 0 K/uL (ref 0.0–0.5)
Eosinophils Relative: 0 %
HCT: 28.2 % — ABNORMAL LOW (ref 36.0–46.0)
Hemoglobin: 9.5 g/dL — ABNORMAL LOW (ref 12.0–15.0)
Immature Granulocytes: 2 %
Lymphocytes Relative: 19 %
Lymphs Abs: 0.7 K/uL (ref 0.7–4.0)
MCH: 34.7 pg — ABNORMAL HIGH (ref 26.0–34.0)
MCHC: 33.7 g/dL (ref 30.0–36.0)
MCV: 102.9 fL — ABNORMAL HIGH (ref 80.0–100.0)
Monocytes Absolute: 0.3 K/uL (ref 0.1–1.0)
Monocytes Relative: 7 %
Neutro Abs: 2.7 K/uL (ref 1.7–7.7)
Neutrophils Relative %: 71 %
Platelet Count: 80 K/uL — ABNORMAL LOW (ref 150–400)
RBC: 2.74 MIL/uL — ABNORMAL LOW (ref 3.87–5.11)
RDW: 20.5 % — ABNORMAL HIGH (ref 11.5–15.5)
WBC Count: 3.8 K/uL — ABNORMAL LOW (ref 4.0–10.5)
nRBC: 0 % (ref 0.0–0.2)

## 2024-05-30 LAB — CMP (CANCER CENTER ONLY)
ALT: 27 U/L (ref 0–44)
AST: 29 U/L (ref 15–41)
Albumin: 3.4 g/dL — ABNORMAL LOW (ref 3.5–5.0)
Alkaline Phosphatase: 75 U/L (ref 38–126)
Anion gap: 12 (ref 5–15)
BUN: 18 mg/dL (ref 6–20)
CO2: 21 mmol/L — ABNORMAL LOW (ref 22–32)
Calcium: 8.8 mg/dL — ABNORMAL LOW (ref 8.9–10.3)
Chloride: 105 mmol/L (ref 98–111)
Creatinine: 0.78 mg/dL (ref 0.44–1.00)
GFR, Estimated: >60 mL/min (ref >60)
Glucose, Bld: 216 mg/dL — ABNORMAL HIGH (ref 70–99)
Potassium: 3.7 mmol/L (ref 3.5–5.1)
Sodium: 138 mmol/L (ref 135–145)
Total Bilirubin: 0.3 mg/dL (ref 0.0–1.2)
Total Protein: 5.9 g/dL — ABNORMAL LOW (ref 6.5–8.1)

## 2024-05-30 MED ORDER — GABAPENTIN 400 MG PO CAPS
400.0000 mg | ORAL_CAPSULE | Freq: Three times a day (TID) | ORAL | 6 refills | Status: AC
  Filled 2024-05-30: qty 90, 30d supply, fill #0
  Filled 2024-08-08: qty 90, 30d supply, fill #1
  Filled 2024-09-02: qty 90, 30d supply, fill #2
  Filled 2024-10-03: qty 90, 30d supply, fill #3

## 2024-05-30 NOTE — Progress Notes (Signed)
 This is Hematology and Oncology Follow Up Visit  CASS EDINGER [id number] 11-18-1965 58 y.o. 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 daily-start on 01/18/2024 Talazoparib 1 mg p.o. daily-start on 12/22/2023 -  changed to 0.50 mg po q day on 03/06/2024 B12 oral 1,000 mcg once daily    Interim History:  Ms. Veach is here today for follow-up.  We had to decrease her Talazoparib to 0.5 mg p.o. daily due to pancytopenia. She was also found to have B12 deficiency   Today she reports that she has been ok since her last visit. She is sad as her dog passed away unexpectedly yesterday. She also has had more nerve type pain of her neck.   Nausea is mild and stable  She is taking her B12 supplement as directed  MRI of the brain on 03/02/2024.  This did not show any evidence of metastatic disease.  She also had a CT of the neck.  This was done on 03/05/2024.  This showed resolution of her adenopathy.  She is still smoking.  She is cut back on smoking.  She smokes about half pack per day.  Her last CA 27.29 (04/24/2024)was down to 14.8 from 18.9 1 month prior   Insulin  pump working going well.   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 There has been no bleeding to her knowledge: denies epistaxis, gingivitis, hemoptysis, hematemesis, hematuria, melena, excessive bruising, blood donation.   She has had no change in  bowel or bladder habits.  She has had no leg swelling.  There is been no rashes.  She has had no fever.  Overall, I would say that her performance status is probably ECOG 1.     Wt Readings from Last 3 Encounters:  05/30/24 141 lb (64 kg)  05/08/24 151 lb 1.8 oz (68.5 kg)  05/07/24 147 lb 6 oz (66.8 kg)    Medications:  Allergies as of 05/30/2024       Reactions   Lithium Other (See Comments)   Spinal fluid built up in brain   Dulaglutide  Nausea And Vomiting, Other (See Comments)   TRULICITY    Nitrofuran Derivatives Itching   Nitrofurantoin  Mono- MCR   Penicillins Other (See Comments)   UNKNOWN CHILDHOOD REACTION        Medication List        Accurate as of May 30, 2024  1:16 PM. If you have any questions, ask your nurse or doctor.          Accu-Chek Guide test strip Generic drug: glucose blood 3 (three) times daily.   albuterol  1.25 MG/3ML nebulizer solution Commonly known as: ACCUNEB  Take 1 ampule by nebulization every 6 (six) hours as needed for wheezing or shortness of breath.   albuterol  108 (90 Base) MCG/ACT inhaler Commonly known as: VENTOLIN  HFA Inhale 2 puffs into the lungs every 6 (six) hours as needed for wheezing or shortness  of breath.   azelastine  0.05 % ophthalmic solution Commonly known as: OPTIVAR  Place 1 drop into both eyes 2 (two) times daily.   cyclobenzaprine  5 MG tablet Commonly known as: FLEXERIL  Take 5 mg by mouth 2 (two) times daily as needed for muscle spasms.   Dexcom G7 Sensor Misc Change sensor every 10 days   fenofibrate  145 MG tablet Commonly known as: TRICOR  Take 145 mg by mouth daily.   gabapentin  300 MG capsule Commonly known as: NEURONTIN  Take 1 capsule (300 mg total) by mouth 3 (three) times daily. What changed: Another medication with the same name was added. Make sure you understand how and when to take each. Changed by: Lauraine CHRISTELLA Dais   gabapentin  400 MG capsule Commonly known as: Neurontin  Take 1  capsule (400 mg total) by mouth 3 (three) times daily. What changed: You were already taking a medication with the same name, and this prescription was added. Make sure you understand how and when to take each. Changed by: Lauraine CHRISTELLA Dais   HYDROcodone -acetaminophen  7.5-325 MG tablet Commonly known as: NORCO Take 1 tablet by mouth every 6 (six) hours as needed for moderate pain (pain score 4-6).   hydrOXYzine 25 MG tablet Commonly known as: ATARAX Take 25 mg by mouth 4 (four) times daily.   lidocaine -prilocaine  cream Commonly known as: EMLA  Apply 1 Application topically daily as needed.   linaclotide  290 MCG Caps capsule Commonly known as: Linzess  Take 1 capsule (290 mcg total) by mouth daily before breakfast.   loratadine  10 MG tablet Commonly known as: CLARITIN  Take 1 tablet (10 mg total) by mouth daily.   lubiprostone  24 MCG capsule Commonly known as: AMITIZA  Take 1 capsule (24 mcg total) by mouth 2 (two) times daily with a meal.   metFORMIN  1000 MG tablet Commonly known as: GLUCOPHAGE  Take 1,000 mg by mouth 2 (two) times daily with a meal.   metFORMIN  1000 MG tablet Commonly known as: GLUCOPHAGE  Take 1 tablet (1,000 mg total) by mouth in the morning and in the evening with meals.   miconazole  200 MG vaginal suppository Commonly known as: MICOTIN Place 1 suppository (200 mg total) vaginally at bedtime.   Movantik  25 MG Tabs tablet Generic drug: naloxegol  oxalate TAKE 1 TABLET (25 MG TOTAL) BY MOUTH DAILY.   nitroGLYCERIN  0.4 MG SL tablet Commonly known as: NITROSTAT  Place 1 tablet (0.4 mg total) under the tongue every 5 (five) minutes as needed for chest pain.   NovoLOG  100 UNIT/ML injection Generic drug: insulin  aspart Inject into the skin as directed.   Insulin  Aspart FlexPen 100 UNIT/ML Commonly known as: NOVOLOG  Inject into the skin.   insulin  aspart 100 UNIT/ML injection Commonly known as: novoLOG  Use as directed via insulin  pump. Total daily dose  100 units.   OLANZapine  10 MG tablet Commonly known as: ZYPREXA  TAKE 1 TABLET BY MOUTH EVERYDAY AT BEDTIME   Omnipod 5 DexG7G6 Pods Gen 5 Misc Apply one pod every 3 days to administer insulin  continuously.   Omnipod 5 DexG7G6 Pods Gen 5 Misc Apply 1 pod every 3 days for insulin  administration.   Omnipod 5 DexG7G6 Pods Gen 5 Misc Apply one pod every 3 days to administer insulin  continuously.   ondansetron  4 MG disintegrating tablet Commonly known as: ZOFRAN -ODT Take 1 tablet (4 mg total) by mouth every 8 (eight) hours.   ondansetron  8 MG tablet Commonly known as: ZOFRAN  Take 1 tablet (8 mg total) by mouth every 8 (eight) hours as needed for nausea or vomiting.  START ON THE THIRD DAY AFTER CHEMOTHERAPY.   oxybutynin  5 MG 24 hr tablet Commonly known as: DITROPAN -XL TAKE 1 TABLET BY MOUTH EVERYDAY AT BEDTIME   pantoprazole  40 MG tablet Commonly known as: PROTONIX  Take 1 tablet (40 mg total) by mouth daily.   PEG 3350  17 g Pack Take 17 g by mouth daily as needed.   prochlorperazine  10 MG tablet Commonly known as: COMPAZINE  Take 1 tablet (10 mg total) by mouth every 6 (six) hours as needed for nausea or vomiting (Zofran  resumes 12/16).   QUEtiapine  400 MG tablet Commonly known as: SEROQUEL  TAKE 2 TABLETS (800 MG TOTAL) BY MOUTH AT BEDTIME.   rivaroxaban  10 MG Tabs tablet Commonly known as: XARELTO  Take 1 tablet (10 mg total) by mouth daily.   rosuvastatin  40 MG tablet Commonly known as: CRESTOR  Take 1 tablet (40 mg total) by mouth daily.   senna 8.6 MG Tabs tablet Commonly known as: SENOKOT Take 1 tablet (8.6 mg total) by mouth 2 (two) times daily.   talazoparib tosylate  0.35 MG capsule Commonly known as: Talzenna  Take 1 capsule (0.35 mg total) by mouth daily.        Allergies:  Allergies  Allergen Reactions   Lithium Other (See Comments)    Spinal fluid built up in brain   Dulaglutide  Nausea And Vomiting and Other (See Comments)    TRULICITY     Nitrofuran Derivatives Itching    Nitrofurantoin  Mono- MCR   Penicillins Other (See Comments)    UNKNOWN CHILDHOOD REACTION    Past Medical History, Surgical history, Social history, and Family History were reviewed and updated.  Review of Systems: Review of Systems  Constitutional:  Negative for malaise/fatigue.  HENT: Negative.    Eyes: Negative.   Respiratory: Negative.    Cardiovascular:  Negative for leg swelling.  Gastrointestinal:  Negative for nausea.  Genitourinary: Negative.   Musculoskeletal:  Positive for joint pain and myalgias.  Skin: Negative.   Neurological:  Negative for tingling.  Endo/Heme/Allergies: Negative.   Psychiatric/Behavioral: Negative.       Physical Exam:  height is 5' (1.524 m) and weight is 141 lb (64 kg). Her oral temperature is 98.2 F (36.8 C). Her blood pressure is 95/74 and her pulse is 102 (abnormal). Her respiration is 18 and oxygen saturation is 100%.   Wt Readings from Last 3 Encounters:  05/30/24 141 lb (64 kg)  05/08/24 151 lb 1.8 oz (68.5 kg)  05/07/24 147 lb 6 oz (66.8 kg)    Physical Exam Vitals reviewed.  HENT:     Head: Normocephalic and atraumatic.     Comments: She does not have any swelling now.  I do not feel any firmness on the left side of her neck.  No adenopathy is noted.  She has no intraoral lesions.  Eyes:     Pupils: Pupils are equal, round, and reactive to light.  Cardiovascular:     Rate and Rhythm: Normal rate and regular rhythm.     Heart sounds: Normal heart sounds.  Pulmonary:     Effort: Pulmonary effort is normal.     Breath sounds: Normal breath sounds.  Abdominal:     General: Bowel sounds are normal. There is no distension.     Palpations: Abdomen is soft.     Tenderness: There is no abdominal tenderness. There is no guarding.  Musculoskeletal:        General: No deformity. Normal range of motion.     Cervical back: Normal range of  motion.  Lymphadenopathy:     Cervical: No cervical  adenopathy.  Skin:    General: Skin is warm and dry.     Coloration: Skin is not jaundiced or pale.     Findings: No bruising, erythema or rash.  Neurological:     Mental Status: She is alert and oriented to person, place, and time.  Psychiatric:        Behavior: Behavior normal.        Thought Content: Thought content normal.        Judgment: Judgment normal.     Lab Results  Component Value Date   WBC 3.8 (L) 05/30/2024   HGB 9.5 (L) 05/30/2024   HCT 28.2 (L) 05/30/2024   MCV 102.9 (H) 05/30/2024   PLT 80 (L) 05/30/2024   Lab Results  Component Value Date   FERRITIN 168 04/24/2024   IRON 82 03/06/2024   TIBC 314 03/06/2024   UIBC 232 03/06/2024   IRONPCTSAT 26 03/06/2024   Lab Results  Component Value Date   RETICCTPCT 1.0 04/24/2024   RBC 2.74 (L) 05/30/2024   No results found for: KPAFRELGTCHN, LAMBDASER, KAPLAMBRATIO No results found for: IGGSERUM, IGA, IGMSERUM No results found for: STEPHANY CARLOTA BENSON MARKEL EARLA JOANNIE DOC VICK, SPEI   Chemistry      Component Value Date/Time   NA 138 05/30/2024 1053   K 3.7 05/30/2024 1053   CL 105 05/30/2024 1053   CO2 21 (L) 05/30/2024 1053   BUN 18 05/30/2024 1053   CREATININE 0.78 05/30/2024 1053      Component Value Date/Time   CALCIUM  8.8 (L) 05/30/2024 1053   ALKPHOS 75 05/30/2024 1053   AST 29 05/30/2024 1053   ALT 27 05/30/2024 1053   BILITOT 0.3 05/30/2024 1053     Encounter Diagnoses  Name Primary?   Malignant neoplasm of nipple of right breast in female, unspecified estrogen receptor status (HCC) Yes   Chemotherapy-induced thrombocytopenia    Chemotherapy induced neutropenia (HCC)    Antineoplastic chemotherapy induced anemia    Secondary malignant neoplasm of cervical lymph node (HCC)    Port-A-Cath in place    History of DVT (deep vein thrombosis)    Malignant neoplasm involving both nipple and areola of right breast in female, unspecified estrogen  receptor status (HCC)     Impression and Plan: Ms. Haertel is a very pleasant  58 yo caucasian female with recurrent ductal carcinoma of the left breast.  Recurrence was in the neck. She was treated with incredibly aggressive chemo radiation therapy.  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  Again this worked very very nicely. She is now on dose reduced Talzenna  and is back on her Xarelto .   Mild cytopenias from her Talzenna . She will continue at current dose at this time De-access today- no blood or platelets needed tomorrow Increasing Gabapentin  from 300 mg TID to 400 mg TID to help with her nerve type pain Obtaining updated MRI of her neck due to increased pain RTC 2 weeks APP, labs (CBC, type cross, CMP), +_ platelets    Lauraine CHRISTELLA Dais, PA-C 8/6/20251:16 PM

## 2024-05-31 ENCOUNTER — Telehealth: Payer: Self-pay | Admitting: Medical Oncology

## 2024-05-31 ENCOUNTER — Ambulatory Visit: Payer: Self-pay | Admitting: Medical Oncology

## 2024-05-31 LAB — CANCER ANTIGEN 27.29: CA 27.29: 32.4 U/mL (ref 0.0–38.6)

## 2024-05-31 NOTE — Telephone Encounter (Signed)
 Called to schedule follow up per 8/6 LOS. LVM to return call for scheduling.

## 2024-06-01 NOTE — Progress Notes (Deleted)
 Cardiology Office Note   Date:  06/01/2024  ID:  Ariana Herrera, DOB Mar 23, 1966, MRN [id number] PCP: Cyndi Shaver, PA-C  [facility] Providers Cardiologist:  Vinie JAYSON Maxcy, MD Cardiology APP:  Vicci Rollo SAUNDERS, PA-C   History of Present Illness Ariana Herrera is a 58 y.o. female with a past medical history of CAD, type 2 DM, HLD, asthma, breast cancer. She is followed by Dr. Maxcy and presents today for a follow up appointment    Per chart review patient previously underwent echocardiogram on 08/21/20 that showed EF 60-65%, no regional wall motion abnormalities, normal RV function. Underwent LHC on 08/22/20 that showed mild nonobstructive CAD. She was managed medically with lipitor, metoprolol , losartan , fenofibrate .   Patient was last seen by Dr. Maxcy on 07/30/22. At that time, patient was doing well from a cardiac perspective. No changes were made to her medications. I saw her in clinic on 09/20/23. At that time, she reported occasional episodes of chest pain once every few months, often during times of significant emotional stress. Discomfort was very brief and resolved on its own. As symptoms were very infrequent, were mild, and resolved on their own, we decided not to pursue ischemic evaluation. She remained on pravastatin , fenofibrate . Started metoprolol  tartrate for occasional tachycardia.    She is followed by oncology for breast cancer. Developed thrombocytopenia with platelets as low as 16 in 02/2024. Her xarelto  and talazoparib were held. 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 At that time, patient reported having frequent episodes of dizziness upon standing. No syncope. Symptoms improved if she stood up too quickly. Denied DOE, lower extremity swelling, palpitations. In clinic, her orthostatic vital signs were positive. Transitioned from metoprolol   tartrate to metoprolol  succinate 12.5 mg daily at bedtime. She was instructed to wear compression stockings, increase fluid and protein intake. In 05/01/24 she continued to have some dizziness upon standing. Metoprolol  was stopped. Follow up cardiac monitor from 04/2024 showed sinus rhythm with heart rate between 77 and 122 bpm.  Average heart rate 93 bpm.   Orthostatic hypotension  Sinus Tachycardia  - When seen in 08/2023, EKG showed sinus tachycardia with HR 115 BPM. She reported that her HR and BP had both been a bit elevated at her past few appointments. Started metoprolol  tartrate 25 mg BID. In 02/2024, she started having episodes of dizziness upon standing and low BP. Orthostatic vital signs in the office positive. Stopped metoprolol  tartrate and instead started metoprolol  succinate 12.5 mg at bedtime.  Metoprolol  was ultimately discontinued -3-day Zio after discontinuing metoprolol  showed sinus rhythm with heart rate ranging from 77-122 bpm, average heart rate 93 bpm -  - Instructed patient to start wearing compression stockings and increase hydration.  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  Hemoglobin as low as 6.8 on 7/1 - Likely her anemia is contributing to her dizziness and sinus tachycardia, dizziness -   Nonobstructive CAD  - Patient underwent cardiac catheterization in 07/2020 that showed mild nonobstructive CAD  - Patient denies chest pain or shortness of breath  - Continue statin therapy for risk reduction  - No ASA as patient is on xarelto     HLD  -  Lipid panel from 03/2024 showed LDL 10, HDL 31, triglycerides 192, total cholesterol 71  - Continue crestor  40 mg daily  - Continue fenofibrate  145 mg daily   ROS: ***  Studies Reviewed      *** Risk Assessment/Calculations {Does this patient have  ATRIAL FIBRILLATION?:[id number]} No BP recorded.  {Refresh Note OR Click here to enter BP  :1}***       Physical Exam VS:  LMP 10/04/2016 Comment: after procedure to stop bleeding       Wt Readings from Last 3 Encounters:  05/30/24 141 lb (64 kg)  05/08/24 151 lb 1.8 oz (68.5 kg)  05/07/24 147 lb 6 oz (66.8 kg)    GEN: Well nourished, well developed in no acute distress NECK: No JVD; No carotid bruits CARDIAC: ***RRR, no murmurs, rubs, gallops RESPIRATORY:  Clear to auscultation without rales, wheezing or rhonchi  ABDOMEN: Soft, non-tender, non-distended EXTREMITIES:  No edema; No deformity   ASSESSMENT AND PLAN ***    {Are you ordering a CV Procedure (e.g. stress test, cath, DCCV, TEE, etc)?   Press F2        :[id number]}  Dispo: ***  Signed, Rollo FABIENE Louder, PA-C

## 2024-06-03 ENCOUNTER — Ambulatory Visit (HOSPITAL_BASED_OUTPATIENT_CLINIC_OR_DEPARTMENT_OTHER)
Admission: RE | Admit: 2024-06-03 | Discharge: 2024-06-03 | Disposition: A | Discharge status: Home / Self Care | Source: Ambulatory Visit | Attending: Medical Oncology | Admitting: Medical Oncology

## 2024-06-03 DIAGNOSIS — M542 Cervicalgia: Secondary | ICD-10-CM | POA: Diagnosis present

## 2024-06-03 DIAGNOSIS — C770 Secondary and unspecified malignant neoplasm of lymph nodes of head, face and neck: Secondary | ICD-10-CM | POA: Insufficient documentation

## 2024-06-03 MED ORDER — GADOBUTROL 1 MMOL/ML IV SOLN
6.4000 mL | Freq: Once | INTRAVENOUS | Status: AC | PRN
  Administered 2024-06-03: 6.4 mL via INTRAVENOUS

## 2024-06-04 ENCOUNTER — Other Ambulatory Visit (HOSPITAL_BASED_OUTPATIENT_CLINIC_OR_DEPARTMENT_OTHER): Payer: Self-pay

## 2024-06-07 ENCOUNTER — Other Ambulatory Visit (HOSPITAL_BASED_OUTPATIENT_CLINIC_OR_DEPARTMENT_OTHER): Payer: Self-pay

## 2024-06-07 ENCOUNTER — Other Ambulatory Visit: Payer: Self-pay

## 2024-06-11 ENCOUNTER — Encounter: Payer: Self-pay | Admitting: *Deleted

## 2024-06-11 ENCOUNTER — Encounter (HOSPITAL_COMMUNITY): Payer: Self-pay

## 2024-06-11 ENCOUNTER — Emergency Department (HOSPITAL_COMMUNITY)

## 2024-06-11 ENCOUNTER — Other Ambulatory Visit: Payer: Self-pay | Admitting: Hematology & Oncology

## 2024-06-11 ENCOUNTER — Other Ambulatory Visit: Payer: Self-pay

## 2024-06-11 ENCOUNTER — Telehealth: Payer: Self-pay

## 2024-06-11 ENCOUNTER — Other Ambulatory Visit (HOSPITAL_BASED_OUTPATIENT_CLINIC_OR_DEPARTMENT_OTHER): Payer: Self-pay

## 2024-06-11 ENCOUNTER — Emergency Department (HOSPITAL_COMMUNITY): Admission: EM | Admit: 2024-06-11 | Discharge: 2024-06-11 | Disposition: A | Discharge status: Home / Self Care

## 2024-06-11 DIAGNOSIS — Z7901 Long term (current) use of anticoagulants: Secondary | ICD-10-CM | POA: Insufficient documentation

## 2024-06-11 DIAGNOSIS — R220 Localized swelling, mass and lump, head: Secondary | ICD-10-CM | POA: Diagnosis present

## 2024-06-11 DIAGNOSIS — I2699 Other pulmonary embolism without acute cor pulmonale: Secondary | ICD-10-CM | POA: Diagnosis not present

## 2024-06-11 LAB — CBC WITH DIFFERENTIAL/PLATELET
Abs Immature Granulocytes: 0.01 K/uL (ref 0.00–0.07)
Basophils Absolute: 0 K/uL (ref 0.0–0.1)
Basophils Relative: 0 %
Eosinophils Absolute: 0 K/uL (ref 0.0–0.5)
Eosinophils Relative: 1 %
HCT: 26.3 % — ABNORMAL LOW (ref 36.0–46.0)
Hemoglobin: 8.2 g/dL — ABNORMAL LOW (ref 12.0–15.0)
Immature Granulocytes: 0 %
Lymphocytes Relative: 33 %
Lymphs Abs: 1 K/uL (ref 0.7–4.0)
MCH: 34.9 pg — ABNORMAL HIGH (ref 26.0–34.0)
MCHC: 31.2 g/dL (ref 30.0–36.0)
MCV: 111.9 fL — ABNORMAL HIGH (ref 80.0–100.0)
Monocytes Absolute: 0.4 K/uL (ref 0.1–1.0)
Monocytes Relative: 13 %
Neutro Abs: 1.6 K/uL — ABNORMAL LOW (ref 1.7–7.7)
Neutrophils Relative %: 53 %
Platelets: 134 K/uL — ABNORMAL LOW (ref 150–400)
RBC: 2.35 MIL/uL — ABNORMAL LOW (ref 3.87–5.11)
RDW: 21.4 % — ABNORMAL HIGH (ref 11.5–15.5)
Smear Review: NORMAL
WBC: 3.1 K/uL — ABNORMAL LOW (ref 4.0–10.5)
nRBC: 0.6 % — ABNORMAL HIGH (ref 0.0–0.2)

## 2024-06-11 LAB — BASIC METABOLIC PANEL WITH GFR
Anion gap: 9 (ref 5–15)
BUN: 11 mg/dL (ref 6–20)
CO2: 23 mmol/L (ref 22–32)
Calcium: 8.6 mg/dL — ABNORMAL LOW (ref 8.9–10.3)
Chloride: 103 mmol/L (ref 98–111)
Creatinine, Ser: 0.73 mg/dL (ref 0.44–1.00)
GFR, Estimated: >60 mL/min (ref >60)
Glucose, Bld: 159 mg/dL — ABNORMAL HIGH (ref 70–99)
Potassium: 3.9 mmol/L (ref 3.5–5.1)
Sodium: 135 mmol/L (ref 135–145)

## 2024-06-11 LAB — PROTIME-INR
INR: 1.3 — ABNORMAL HIGH (ref 0.8–1.2)
Prothrombin Time: 16.9 s — ABNORMAL HIGH (ref 11.4–15.2)

## 2024-06-11 LAB — APTT: aPTT: 38 s — ABNORMAL HIGH (ref 24–36)

## 2024-06-11 MED ORDER — HYDROCODONE-ACETAMINOPHEN 7.5-325 MG PO TABS
1.0000 | ORAL_TABLET | Freq: Four times a day (QID) | ORAL | 0 refills | Status: DC | PRN
  Filled 2024-06-11: qty 120, 30d supply, fill #0

## 2024-06-11 MED ORDER — ONDANSETRON HCL 4 MG/2ML IJ SOLN
4.0000 mg | Freq: Once | INTRAMUSCULAR | Status: AC
  Administered 2024-06-11: 4 mg via INTRAVENOUS
  Filled 2024-06-11: qty 2

## 2024-06-11 MED ORDER — RIVAROXABAN 10 MG PO TABS
10.0000 mg | ORAL_TABLET | Freq: Once | ORAL | Status: AC
  Administered 2024-06-11: 10 mg via ORAL
  Filled 2024-06-11: qty 1

## 2024-06-11 MED ORDER — MORPHINE SULFATE (PF) 2 MG/ML IV SOLN
2.0000 mg | Freq: Once | INTRAVENOUS | Status: AC
  Administered 2024-06-11: 2 mg via INTRAVENOUS
  Filled 2024-06-11: qty 1

## 2024-06-11 MED ORDER — RIVAROXABAN 10 MG PO TABS: 10.0000 mg | ORAL_TABLET | Freq: Every day | ORAL | Status: DC

## 2024-06-11 MED ORDER — IOHEXOL 350 MG/ML SOLN
150.0000 mL | Freq: Once | INTRAVENOUS | Status: AC | PRN
  Administered 2024-06-11: 150 mL via INTRAVENOUS

## 2024-06-11 NOTE — Discharge Instructions (Addendum)
 Did give your home dose of Xarelto  tonight.  Please take this medication as prescribed.  Please make sure you go pick this up tomorrow and take it as prescribed.  If you are not able to get this filled tomorrow or pick it up at the pharmacy, then please call your oncologist.  Is very important that you take this medication because of blood clots can get worse.   Your MRI is not back at this point time.  Follow-up with the oncologist regarding that report.  If anything changes, please come back to the ED for further evaluation.

## 2024-06-11 NOTE — ED Triage Notes (Signed)
 Pt reports with left facial pain and swelling that goes into her neck, pt states that her cancer dr told her to be seen because she had a clot before.

## 2024-06-11 NOTE — Telephone Encounter (Signed)
 Dr. Timmy reviewed Ariana Herrera Ariana Herrera message and her pictures. Per Dr. Timmy he wanted Ariana Herrera to go to ER immediately since she had been off her blood thinner for over a week and developed the swelling.  This RN called Ariana Herrera and informed Ariana Herrera of recommendation. Ariana Herrera verbalized understanding and had no further questions. Ariana Herrera states she will go to the [facility] ER to be evaluated.

## 2024-06-11 NOTE — ED Provider Notes (Signed)
 [place] EMERGENCY DEPARTMENT AT [facility] Provider Note   CSN: [id number] Arrival date & time: 06/11/24  1814     Patient presents with: Facial Swelling   Ariana Herrera is a 58 y.o. female.   HPI  Patient presents because of facial swelling.  Patient states that she has a history of a clot in her left side of her neck but she is not sure what kind of vessel.  Patient states that she is post be on Xarelto  but has unfortunately missed the past week of medication because of insurance issues.  She thinks pharmacy filled it today.  Endorses chronic neck pain.  No fever no chills.  No pain with neck flexion.  No vision changes.  No numbness or tingling anywhere.  No facial droop that she can appreciate.  No nausea vomit or diarrhea.  No chest pain or shortness of breath.  Otherwise, she states that she feeling within her normal baseline     Previous medical history reviewed : Patient was last admitted back in December 2024.  Was admitted because of facial swelling.  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   Prior to Admission medications   Medication Sig Start Date End Date Taking? Authorizing Provider  ACCU-CHEK GUIDE test strip 3 (three) times daily. 07/17/21   [provider]  albuterol  (ACCUNEB ) 1.25 MG/3ML nebulizer solution Take 1 ampule by nebulization every 6 (six) hours as needed for wheezing or shortness of breath. 02/27/23   [provider]  albuterol  (VENTOLIN  HFA) 108 (90 Base) MCG/ACT inhaler Inhale 2 puffs into the lungs every 6 (six) hours as needed for wheezing or shortness of breath. 06/23/23   Drubel, Manuelita, PA-C  azelastine  (OPTIVAR ) 0.05 % ophthalmic solution Place 1 drop into both eyes 2 (two) times daily. 05/07/24   Cyndi Manuelita, PA-C  Continuous Glucose Sensor (DEXCOM G7 SENSOR) MISC Change sensor every 10 days 02/20/24   [provider]  cyclobenzaprine  (FLEXERIL ) 5 MG tablet Take 5  mg by mouth 2 (two) times daily as needed for muscle spasms.    [provider]  fenofibrate  (TRICOR ) 145 MG tablet Take 145 mg by mouth daily. 11/22/21   [provider]  gabapentin  (NEURONTIN ) 300 MG capsule Take 1 capsule (300 mg total) by mouth 3 (three) times daily. 10/13/23   Drusilla Sabas RAMAN, MD  gabapentin  (NEURONTIN ) 400 MG capsule Take 1 capsule (400 mg total) by mouth 3 (three) times daily. 05/30/24   Tonette Lauraine HERO, PA-C  HYDROcodone -acetaminophen  (NORCO) 7.5-325 MG tablet Take 1 tablet by mouth every 6 (six) hours as needed for moderate pain (pain score 4-6). 06/11/24   Timmy Maude SAUNDERS, MD  hydrOXYzine (ATARAX) 25 MG tablet Take 25 mg by mouth 4 (four) times daily. 01/07/24   [provider]  insulin  aspart (NOVOLOG ) 100 UNIT/ML injection Use as directed via insulin  pump. Total daily dose 100 units. 05/29/24     Insulin  Aspart FlexPen (NOVOLOG ) 100 UNIT/ML Inject into the skin. 03/22/24   [provider]  Insulin  Disposable Pump (OMNIPOD 5 DEXG7G6 PODS GEN 5) MISC Apply one pod every 3 days to administer insulin  continuously. 02/20/24   [provider]  Insulin  Disposable Pump (OMNIPOD 5 DEXG7G6 PODS GEN 5) MISC Apply 1 pod every 3 days for insulin  administration. 02/20/24     Insulin  Disposable Pump (OMNIPOD 5 DEXG7G6 PODS GEN 5) MISC Apply one pod every 3 days to administer insulin  continuously. 05/29/24  lidocaine -prilocaine  (EMLA ) cream Apply 1 Application topically daily as needed. 05/08/24   Tonette Lauraine HERO, PA-C  linaclotide  (LINZESS ) 290 MCG CAPS capsule Take 1 capsule (290 mcg total) by mouth daily before breakfast. 12/05/23   Federico Rosario BROCKS, MD  loratadine  (CLARITIN ) 10 MG tablet Take 1 tablet (10 mg total) by mouth daily. 05/07/24   Cyndi Shaver, PA-C  lubiprostone  (AMITIZA ) 24 MCG capsule Take 1 capsule (24 mcg total) by mouth 2 (two) times daily with a meal. 05/11/24   Federico Rosario BROCKS, MD  metFORMIN  (GLUCOPHAGE ) 1000 MG tablet Take 1,000  mg by mouth 2 (two) times daily with a meal. 11/26/19   [provider]  metFORMIN  (GLUCOPHAGE ) 1000 MG tablet Take 1 tablet (1,000 mg total) by mouth in the morning and in the evening with meals. 05/29/24     miconazole  (MICOTIN) 200 MG vaginal suppository Place 1 suppository (200 mg total) vaginally at bedtime. 03/15/24   Cyndi Shaver, PA-C  MOVANTIK  25 MG TABS tablet TAKE 1 TABLET (25 MG TOTAL) BY MOUTH DAILY. 05/11/24   Federico Rosario BROCKS, MD  nitroGLYCERIN  (NITROSTAT ) 0.4 MG SL tablet Place 1 tablet (0.4 mg total) under the tongue every 5 (five) minutes as needed for chest pain. 09/04/20   Emelia Josefa HERO, NP  NOVOLOG  100 UNIT/ML injection Inject into the skin as directed. 03/20/24   [provider]  OLANZapine  (ZYPREXA ) 10 MG tablet TAKE 1 TABLET BY MOUTH EVERYDAY AT BEDTIME 03/16/24   Ennever, Maude SAUNDERS, MD  ondansetron  (ZOFRAN ) 8 MG tablet Take 1 tablet (8 mg total) by mouth every 8 (eight) hours as needed for nausea or vomiting. START ON THE THIRD DAY AFTER CHEMOTHERAPY. 12/05/23   Federico Rosario BROCKS, MD  ondansetron  (ZOFRAN -ODT) 4 MG disintegrating tablet Take 1 tablet (4 mg total) by mouth every 8 (eight) hours. 03/01/24   Tonette Lauraine HERO, PA-C  oxybutynin  (DITROPAN -XL) 5 MG 24 hr tablet TAKE 1 TABLET BY MOUTH EVERYDAY AT BEDTIME 03/12/24   Drubel, Shaver, PA-C  pantoprazole  (PROTONIX ) 40 MG tablet Take 1 tablet (40 mg total) by mouth daily. 05/07/24   Federico Rosario BROCKS, MD  polyethylene glycol (MIRALAX  / GLYCOLAX ) 17 g packet Take 17 g by mouth daily as needed. 10/12/23   Drusilla Sabas RAMAN, MD  prochlorperazine  (COMPAZINE ) 10 MG tablet Take 1 tablet (10 mg total) by mouth every 6 (six) hours as needed for nausea or vomiting (Zofran  resumes 12/16). 10/12/23   Drusilla Sabas RAMAN, MD  QUEtiapine  (SEROQUEL ) 400 MG tablet TAKE 2 TABLETS (800 MG TOTAL) BY MOUTH AT BEDTIME. 11/22/23   Drubel, Shaver, PA-C  rivaroxaban  (XARELTO ) 10 MG TABS tablet Take 1 tablet (10 mg total) by mouth daily. 03/06/24    Tonette Lauraine HERO, PA-C  rosuvastatin  (CRESTOR ) 40 MG tablet Take 1 tablet (40 mg total) by mouth daily. 09/29/23 05/30/24  Vicci Rollo SAUNDERS, PA-C  senna (SENOKOT) 8.6 MG TABS tablet Take 1 tablet (8.6 mg total) by mouth 2 (two) times daily. 10/12/23   Drusilla Sabas RAMAN, MD  talazoparib tosylate  (TALZENNA ) 0.35 MG capsule Take 1 capsule (0.35 mg total) by mouth daily. 05/08/24   Tonette Lauraine HERO, PA-C    Allergies: Lithium, Dulaglutide , Nitrofuran derivatives, and Penicillins    Review of Systems  Constitutional:  Negative for chills and fever.  HENT:  Negative for ear pain and sore throat.   Eyes:  Negative for pain and visual disturbance.  Respiratory:  Negative for cough and shortness of breath.   Cardiovascular:  Negative for  chest pain and palpitations.  Gastrointestinal:  Negative for abdominal pain and vomiting.  Genitourinary:  Negative for dysuria and hematuria.  Musculoskeletal:  Negative for arthralgias and back pain.  Skin:  Negative for color change and rash.  Neurological:  Negative for seizures and syncope.  All other systems reviewed and are negative.   Updated Vital Signs BP 127/61   Pulse 98   Temp 98.8 F (37.1 C) (Oral)   Resp 20   LMP 10/04/2016 Comment: after procedure to stop bleeding  SpO2 98%   Physical Exam Vitals and nursing note reviewed.  Constitutional:      General: She is not in acute distress.    Appearance: She is well-developed.  HENT:     Head: Normocephalic and atraumatic.   Eyes:     Conjunctiva/sclera: Conjunctivae normal.  Cardiovascular:     Rate and Rhythm: Normal rate and regular rhythm.     Heart sounds: No murmur heard. Pulmonary:     Effort: Pulmonary effort is normal. No respiratory distress.     Breath sounds: Normal breath sounds.  Abdominal:     Palpations: Abdomen is soft.     Tenderness: There is no abdominal tenderness.  Musculoskeletal:        General: No swelling.     Cervical back: Neck supple.  Skin:     General: Skin is warm and dry.     Capillary Refill: Capillary refill takes less than 2 seconds.  Neurological:     Mental Status: She is alert.  Psychiatric:        Mood and Affect: Mood normal.     (all labs ordered are listed, but only abnormal results are displayed) Labs Reviewed  BASIC METABOLIC PANEL WITH GFR - Abnormal; Notable for the following components:      Result Value   Glucose, Bld 159 (*)    Calcium  8.6 (*)    All other components within normal limits  CBC WITH DIFFERENTIAL/PLATELET - Abnormal; Notable for the following components:   WBC 3.1 (*)    RBC 2.35 (*)    Hemoglobin 8.2 (*)    HCT 26.3 (*)    MCV 111.9 (*)    MCH 34.9 (*)    RDW 21.4 (*)    Platelets 134 (*)    nRBC 0.6 (*)    Neutro Abs 1.6 (*)    All other components within normal limits  PROTIME-INR - Abnormal; Notable for the following components:   Prothrombin Time 16.9 (*)    INR 1.3 (*)    All other components within normal limits  APTT - Abnormal; Notable for the following components:   aPTT 38 (*)    All other components within normal limits    EKG: None  Radiology: CT Angio Neck W and/or Wo Contrast Result Date: 06/11/2024 CLINICAL DATA:  Neck mass, nonpulsatile EXAM: CT ANGIOGRAPHY NECK TECHNIQUE: Multidetector CT imaging of the neck was performed using the standard protocol during bolus administration of intravenous contrast. Multiplanar CT image reconstructions and MIPs were obtained to evaluate the vascular anatomy. Carotid stenosis measurements (when applicable) are obtained utilizing NASCET criteria, using the distal internal carotid diameter as the denominator. RADIATION DOSE REDUCTION: This exam was performed according to the departmental dose-optimization program which includes automated exposure control, adjustment of the mA and/or kV according to patient size and/or use of iterative reconstruction technique. CONTRAST:  OMNIPAQUE  IOHEXOL  350 MG/ML SOLN COMPARISON:  None  Available. FINDINGS: Aortic arch: Great vessel origins are patent.  Right carotid system: No evidence of dissection, stenosis (50% or greater) or occlusion. Left carotid system: No evidence of dissection, stenosis (50% or greater) or occlusion. Vertebral arteries: Right dominant. No evidence of dissection, stenosis (50% or greater) or occlusion. Mild left vertebral artery origin stenosis. Skeleton: No acute abnormality on limited assessment. Lower cervical degenerative disc disease. Other neck: Please see recent 06/03/2024 MRI of the neck for nonvascular evaluation of the neck. Upper chest: Lung apices are clear. IMPRESSION: 1. No evidence of acute arterial injury or significant stenosis in the neck. 2. Please see recent 06/03/2024 MRI of the neck for nonvascular evaluation of the neck. Electronically Signed   By: Gilmore GORMAN Molt M.D.   On: 06/11/2024 22:28   CT Angio Chest PE W and/or Wo Contrast Result Date: 06/11/2024 CLINICAL DATA:  Facial pain and swelling into neck. History of clot. Treated for breast cancer. Missed last week of Xarelto . EXAM: CT ANGIOGRAPHY CHEST WITH CONTRAST TECHNIQUE: Multidetector CT imaging of the chest was performed using the standard protocol during bolus administration of intravenous contrast. Multiplanar CT image reconstructions and MIPs were obtained to evaluate the vascular anatomy. RADIATION DOSE REDUCTION: This exam was performed according to the departmental dose-optimization program which includes automated exposure control, adjustment of the mA and/or kV according to patient size and/or use of iterative reconstruction technique. CONTRAST:  OMNIPAQUE  IOHEXOL  350 MG/ML SOLN COMPARISON:  Chest radiograph 03/01/2024 and CT 10/06/2023 FINDINGS: Cardiovascular: Small pericardial effusion. Normal caliber aorta without dissection. Coronary artery and aortic atherosclerotic calcification. Right chest wall Port-A-Cath. Tiny subsegmental pulmonary embolism in a posterior left  lower lobe pulmonary artery (series 17, image 212). No evidence of right heart strain. Mediastinum/Nodes: Trachea and esophagus are unremarkable. Vague nodularity in the left supraclavicular region on 11/17 is unchanged and measures 12 x 16 mm. Lungs/Pleura: Subpleural scarring in the anterior left upper lobe likely due to post radiation change. The lungs are otherwise clear. No pleural effusion or pneumothorax. Upper Abdomen: No acute abnormality. Musculoskeletal: No acute fracture or destructive osseous lesion. Postsurgical changes in the left breast with diffuse dermal and trabecular thickening similar to prior. Findings in the neck reported separately. Review of the MIP images confirms the above findings. IMPRESSION: 1. Tiny subsegmental pulmonary embolism in a posterior left lower lobe pulmonary artery. No evidence of right heart strain. 2. Small pericardial effusion. 3. Postsurgical changes in the left breast with diffuse dermal and trabecular thickening similar to prior. Findings in the neck reported separately. 4. Unchanged vague nodularity in the left supraclavicular region. 5. Aortic Atherosclerosis (ICD10-I70.0). Critical Value/emergent results were called by telephone at the time of interpretation on 06/11/2024 at 10:09 pm to provider [facility] , who verbally acknowledged these results. Electronically Signed   By: Norman Gatlin M.D.   On: 06/11/2024 22:20     Procedures   Medications Ordered in the ED  rivaroxaban  (XARELTO ) tablet 10 mg (has no administration in time range)  iohexol  (OMNIPAQUE ) 350 MG/ML injection 150 mL (150 mLs Intravenous Contrast Given 06/11/24 2113)  morphine  (PF) 2 MG/ML injection 2 mg (2 mg Intravenous Given 06/11/24 2236)  ondansetron  (ZOFRAN ) injection 4 mg (4 mg Intravenous Given 06/11/24 2236)    Clinical Course as of 06/11/24 2245  Mon Jun 11, 2024  2214 Small PE sub segmental  [TL]    Clinical Course User Index [TL] Simon Lavonia SAILOR, MD                    NIH Stroke Scale:  0              Medical Decision Making Amount and/or Complexity of Data Reviewed Labs: ordered.  Risk Prescription drug management.    Patient presents because of facial swelling.  Patient states that she has a history of a clot in her left side of her neck but she is not sure what kind of vessel.  Patient states that she is post be on Xarelto  but has unfortunately missed the past week of medication because of insurance issues.  She thinks pharmacy filled it today.  Endorses chronic neck pain.  No fever no chills.  No pain with neck flexion.  No vision changes.  No numbness or tingling anywhere.  No facial droop that she can appreciate.  No nausea vomit or diarrhea.  No chest pain or shortness of breath.  Otherwise, she states that she feeling within her normal baseline   Previous medical history reviewed : Patient was last admitted back in December 2024.  Was admitted because of facial swelling.  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    Upon exam, patient hemodynamically stable.  A and O x 3 GCS 15.  No focal deficits.  Cranials 2 through 12 intact.  NIH is 0.  Strength and sensation intact in bilateral upper and lower extremities.  No facial droop.  No dysarthria or aphasia.   Some slight swelling to the left side of the face.   Given that patient's been off of her Xarelto  for the past week because of insurance issues, concern for possible repeat left internal jugular vein thrombosis as well as other vascular pathology.  Therefore, did obtain CTA of the chest and CT of the neck.  CT of the neck thankfully did not show any kind of jugular venous thrombosis.  CTA of the chest did show a very minimal subsegmental PE.  Tiny in nature.  Patient is hemodynamically stable here.  I do not think this is a treatment failure given that she has been off of her medication for the past week.  Patient states that the medication is at her  pharmacy and she can pick this up tomorrow.  Therefore, we will give her home dose here tonight.  Explained to her at length how important it is that she does pick this medication up tomorrow to restart this.  Patient patient is hemodynamically stable here in ED.  On room air, 100% on room air.  No tachycardia.  Maps appropriate.  No right heart strain.   Patient's hemoglobin is 8.2.  1 point below where she was about 12 days ago.  No bright red blood per rectum.  No dark stool.  Encourage strict return precautions and repeat labs outpatient.    Final diagnoses:  Facial swelling  Other acute pulmonary embolism without acute [facility])    ED Discharge Orders     None          Simon Lavonia SAILOR, MD 06/11/24 2245

## 2024-06-11 NOTE — ED Provider Triage Note (Signed)
 Emergency Medicine Provider Triage Evaluation Note  Ariana Herrera , a 58 y.o. female  was evaluated in triage.  Pt complains of swelling of the neck.  She is currently being treated for breast cancer.  Reports that she previously had a blood clot in her left neck.  She had been taking Xarelto , however due to insurance reasons, missed it for 1 week.  She has had increasing swelling in her neck, and the left side of her face for the last day.  Review of Systems    Physical Exam  BP (!) 97/55 (BP Location: Right Arm)   Pulse 84   Temp 99 F (37.2 C) (Oral)   Resp 16   LMP 10/04/2016 Comment: after procedure to stop bleeding  SpO2 96%  Gen:   Awake, no distress   Resp:  Normal effort  MSK:   Moves extremities without difficulty  Other:    Medical Decision Making  Medically screening exam initiated at 6:39 PM.  Appropriate orders placed.  Ariana Herrera was informed that the remainder of the evaluation will be completed by another provider, this initial triage assessment does not replace that evaluation, and the importance of remaining in the ED until their evaluation is complete.  Airway intact.  Some swelling at the left angle of the mandible.  With her history, concern for infection, malignancy, embolism.  Imaging and blood work ordered.   Mannie Pac T, DO 06/11/24 1840

## 2024-06-12 ENCOUNTER — Ambulatory Visit: Payer: Self-pay | Admitting: Hematology & Oncology

## 2024-06-13 ENCOUNTER — Other Ambulatory Visit (HOSPITAL_BASED_OUTPATIENT_CLINIC_OR_DEPARTMENT_OTHER): Payer: Self-pay

## 2024-06-13 ENCOUNTER — Encounter: Payer: Self-pay | Admitting: Medical Oncology

## 2024-06-13 ENCOUNTER — Inpatient Hospital Stay (HOSPITAL_BASED_OUTPATIENT_CLINIC_OR_DEPARTMENT_OTHER): Admitting: Medical Oncology

## 2024-06-13 ENCOUNTER — Inpatient Hospital Stay

## 2024-06-13 VITALS — BP 104/52 | HR 100 | Temp 98.3°F | Resp 19 | Ht 60.0 in | Wt 150.0 lb

## 2024-06-13 DIAGNOSIS — C770 Secondary and unspecified malignant neoplasm of lymph nodes of head, face and neck: Secondary | ICD-10-CM

## 2024-06-13 DIAGNOSIS — C50011 Malignant neoplasm of nipple and areola, right female breast: Secondary | ICD-10-CM | POA: Diagnosis not present

## 2024-06-13 DIAGNOSIS — Z86718 Personal history of other venous thrombosis and embolism: Secondary | ICD-10-CM

## 2024-06-13 DIAGNOSIS — T451X5A Adverse effect of antineoplastic and immunosuppressive drugs, initial encounter: Secondary | ICD-10-CM

## 2024-06-13 DIAGNOSIS — L03114 Cellulitis of left upper limb: Secondary | ICD-10-CM

## 2024-06-13 DIAGNOSIS — D6481 Anemia due to antineoplastic chemotherapy: Secondary | ICD-10-CM

## 2024-06-13 DIAGNOSIS — D6959 Other secondary thrombocytopenia: Secondary | ICD-10-CM | POA: Diagnosis not present

## 2024-06-13 DIAGNOSIS — D701 Agranulocytosis secondary to cancer chemotherapy: Secondary | ICD-10-CM | POA: Diagnosis not present

## 2024-06-13 DIAGNOSIS — Z95828 Presence of other vascular implants and grafts: Secondary | ICD-10-CM

## 2024-06-13 DIAGNOSIS — E538 Deficiency of other specified B group vitamins: Secondary | ICD-10-CM

## 2024-06-13 LAB — CBC WITH DIFFERENTIAL (CANCER CENTER ONLY)
Abs Immature Granulocytes: 0.02 K/uL (ref 0.00–0.07)
Basophils Absolute: 0 K/uL (ref 0.0–0.1)
Basophils Relative: 0 %
Eosinophils Absolute: 0 K/uL (ref 0.0–0.5)
Eosinophils Relative: 1 %
HCT: 26.6 % — ABNORMAL LOW (ref 36.0–46.0)
Hemoglobin: 8.8 g/dL — ABNORMAL LOW (ref 12.0–15.0)
Immature Granulocytes: 1 %
Lymphocytes Relative: 33 %
Lymphs Abs: 0.9 K/uL (ref 0.7–4.0)
MCH: 36.2 pg — ABNORMAL HIGH (ref 26.0–34.0)
MCHC: 33.1 g/dL (ref 30.0–36.0)
MCV: 109.5 fL — ABNORMAL HIGH (ref 80.0–100.0)
Monocytes Absolute: 0.3 K/uL (ref 0.1–1.0)
Monocytes Relative: 12 %
Neutro Abs: 1.5 K/uL — ABNORMAL LOW (ref 1.7–7.7)
Neutrophils Relative %: 53 %
Platelet Count: 141 K/uL — ABNORMAL LOW (ref 150–400)
RBC: 2.43 MIL/uL — ABNORMAL LOW (ref 3.87–5.11)
RDW: 20.9 % — ABNORMAL HIGH (ref 11.5–15.5)
WBC Count: 2.8 K/uL — ABNORMAL LOW (ref 4.0–10.5)
nRBC: 0 % (ref 0.0–0.2)

## 2024-06-13 LAB — CMP (CANCER CENTER ONLY)
ALT: 11 U/L (ref 0–44)
AST: 13 U/L — ABNORMAL LOW (ref 15–41)
Albumin: 3.6 g/dL (ref 3.5–5.0)
Alkaline Phosphatase: 80 U/L (ref 38–126)
Anion gap: 12 (ref 5–15)
BUN: 13 mg/dL (ref 6–20)
CO2: 24 mmol/L (ref 22–32)
Calcium: 9.1 mg/dL (ref 8.9–10.3)
Chloride: 102 mmol/L (ref 98–111)
Creatinine: 0.67 mg/dL (ref 0.44–1.00)
GFR, Estimated: >60 mL/min (ref >60)
Glucose, Bld: 135 mg/dL — ABNORMAL HIGH (ref 70–99)
Potassium: 4.2 mmol/L (ref 3.5–5.1)
Sodium: 138 mmol/L (ref 135–145)
Total Bilirubin: 0.3 mg/dL (ref 0.0–1.2)
Total Protein: 6.1 g/dL — ABNORMAL LOW (ref 6.5–8.1)

## 2024-06-13 LAB — SAMPLE TO BLOOD BANK

## 2024-06-13 MED ORDER — DOXYCYCLINE HYCLATE 100 MG PO TABS
100.0000 mg | ORAL_TABLET | Freq: Two times a day (BID) | ORAL | 0 refills | Status: AC
  Filled 2024-06-13: qty 20, 10d supply, fill #0

## 2024-06-13 NOTE — Patient Instructions (Signed)

## 2024-06-13 NOTE — Progress Notes (Signed)
 This is Hematology and Oncology Follow Up Visit  Ariana Herrera [id number] [birth year] 58 y.o. 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 daily-start on 01/18/2024 Talazoparib 1 mg p.o. daily-start on 12/22/2023 -  changed to 0.50 mg po q day on 03/06/2024 B12 oral 1,000 mcg once daily    Interim History:  Ariana Herrera is here today for follow-up.  We had to decrease her Talazoparib to 0.5 mg p.o. daily due to pancytopenia. She was also found to have B12 deficiency   She is here for follow up for facial swelling and neck pain. She had an MRI on 06/03/2024 which showed no evidence of return of her cancer in the neck. She was seen on 06/11/2024 by the ER for facial swelling. She was found to have a tiny subsegmental PE in the posterior left lower lobe pulmonary artery without evidence of heart strain. She also had a small pericardial effusion. CT angio neck showed no acute arterial injury or stenosis. She was instructed to restart her blood thinners which she has been out of due to supply issues with her pharmacy.   She was also seen by urgent care a few days ago for a infected cut of her left hand. She was given an rx for doxycyline but has not started this yet due to cost. She denies fevers.   Nausea is mild and stable  She is taking her B12 supplement as directed  She is still smoking.  She is cut back on smoking.  She smokes about half pack per  day.  Her last CA 27.29  on 05/30/2024 was 32.4 which is up from 14.8 1 month prior.   Insulin  pump working going well.   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 There has been no bleeding to her knowledge: denies epistaxis, gingivitis, hemoptysis, hematemesis, hematuria, melena, excessive bruising, blood donation.   She has had no change in bowel or bladder habits.  She has had no leg swelling.  There is been no rashes.  She has had no fever.  Overall, I would say that her performance status is probably ECOG 1.     Wt Readings from Last 3 Encounters:  06/13/24 150 lb (68 kg)  05/30/24 141 lb (64 kg)  05/08/24 151 lb 1.8 oz (68.5 kg)    Medications:  Allergies as of 06/13/2024       Reactions   Lithium Other (See Comments)   Spinal fluid built up in brain   Dulaglutide  Nausea And Vomiting, Other (See Comments)   TRULICITY    Nitrofuran Derivatives Itching   Nitrofurantoin  Mono- MCR   Penicillins Other (See Comments)   UNKNOWN CHILDHOOD REACTION        Medication List        Accurate as of June 13, 2024 12:47 PM. If you have any questions, ask your nurse or doctor.  Accu-Chek Guide test strip Generic drug: glucose blood 3 (three) times daily.   albuterol  1.25 MG/3ML nebulizer solution Commonly known as: ACCUNEB  Take 1 ampule by nebulization every 6 (six) hours as needed for wheezing or shortness of breath.   albuterol  108 (90 Base) MCG/ACT inhaler Commonly known as: VENTOLIN  HFA Inhale 2 puffs into the lungs every 6 (six) hours as needed for wheezing or shortness of breath.   azelastine  0.05 % ophthalmic solution Commonly known as: OPTIVAR  Place 1 drop into both eyes 2 (two) times daily.   cyclobenzaprine  5 MG tablet Commonly known as: FLEXERIL  Take 5 mg by mouth 2 (two) times daily as needed for muscle spasms.   Dexcom G7 Sensor Misc Change sensor every 10 days   doxycycline  100 MG  tablet Commonly known as: VIBRA -TABS Take 1 tablet (100 mg total) by mouth 2 (two) times daily for 10 days. Started by: Lauraine CHRISTELLA Dais   fenofibrate  145 MG tablet Commonly known as: TRICOR  Take 145 mg by mouth daily.   gabapentin  300 MG capsule Commonly known as: NEURONTIN  Take 1 capsule (300 mg total) by mouth 3 (three) times daily.   gabapentin  400 MG capsule Commonly known as: Neurontin  Take 1 capsule (400 mg total) by mouth 3 (three) times daily.   HYDROcodone -acetaminophen  7.5-325 MG tablet Commonly known as: NORCO Take 1 tablet by mouth every 6 (six) hours as needed for moderate pain (pain score 4-6).   hydrOXYzine 25 MG tablet Commonly known as: ATARAX Take 25 mg by mouth 4 (four) times daily.   lidocaine -prilocaine  cream Commonly known as: EMLA  Apply 1 Application topically daily as needed.   linaclotide  290 MCG Caps capsule Commonly known as: Linzess  Take 1 capsule (290 mcg total) by mouth daily before breakfast.   loratadine  10 MG tablet Commonly known as: CLARITIN  Take 1 tablet (10 mg total) by mouth daily.   lubiprostone  24 MCG capsule Commonly known as: AMITIZA  Take 1 capsule (24 mcg total) by mouth 2 (two) times daily with a meal.   metFORMIN  1000 MG tablet Commonly known as: GLUCOPHAGE  Take 1 tablet (1,000 mg total) by mouth in the morning and in the evening with meals. What changed: Another medication with the same name was removed. Continue taking this medication, and follow the directions you see here. Changed by: Lauraine CHRISTELLA Dais   miconazole  200 MG vaginal suppository Commonly known as: MICOTIN Place 1 suppository (200 mg total) vaginally at bedtime.   Movantik  25 MG Tabs tablet Generic drug: naloxegol  oxalate TAKE 1 TABLET (25 MG TOTAL) BY MOUTH DAILY.   nitroGLYCERIN  0.4 MG SL tablet Commonly known as: NITROSTAT  Place 1 tablet (0.4 mg total) under the tongue every 5 (five) minutes as needed for chest pain.   NovoLOG  100 UNIT/ML  injection Generic drug: insulin  aspart Inject into the skin as directed.   Insulin  Aspart FlexPen 100 UNIT/ML Commonly known as: NOVOLOG  Inject into the skin.   insulin  aspart 100 UNIT/ML injection Commonly known as: novoLOG  Use as directed via insulin  pump. Total daily dose 100 units.   OLANZapine  10 MG tablet Commonly known as: ZYPREXA  TAKE 1 TABLET BY MOUTH EVERYDAY AT BEDTIME   Omnipod 5 DexG7G6 Pods Gen 5 Misc Apply one pod every 3 days to administer insulin  continuously.   Omnipod 5 DexG7G6 Pods Gen 5 Misc Apply 1 pod every 3 days for insulin  administration.   Omnipod 5 DexG7G6 Pods Gen 5 Misc Apply one pod every 3 days to administer insulin  continuously.   ondansetron  4 MG  disintegrating tablet Commonly known as: ZOFRAN -ODT Take 1 tablet (4 mg total) by mouth every 8 (eight) hours.   ondansetron  8 MG tablet Commonly known as: ZOFRAN  Take 1 tablet (8 mg total) by mouth every 8 (eight) hours as needed for nausea or vomiting. START ON THE THIRD DAY AFTER CHEMOTHERAPY.   oxybutynin  5 MG 24 hr tablet Commonly known as: DITROPAN -XL TAKE 1 TABLET BY MOUTH EVERYDAY AT BEDTIME   pantoprazole  40 MG tablet Commonly known as: PROTONIX  Take 1 tablet (40 mg total) by mouth daily.   PEG 3350  17 g Pack Take 17 g by mouth daily as needed.   prochlorperazine  10 MG tablet Commonly known as: COMPAZINE  Take 1 tablet (10 mg total) by mouth every 6 (six) hours as needed for nausea or vomiting (Zofran  resumes 12/16).   QUEtiapine  400 MG tablet Commonly known as: SEROQUEL  TAKE 2 TABLETS (800 MG TOTAL) BY MOUTH AT BEDTIME.   rivaroxaban  10 MG Tabs tablet Commonly known as: XARELTO  Take 1 tablet (10 mg total) by mouth daily.   rosuvastatin  40 MG tablet Commonly known as: CRESTOR  Take 1 tablet (40 mg total) by mouth daily.   senna 8.6 MG Tabs tablet Commonly known as: SENOKOT Take 1 tablet (8.6 mg total) by mouth 2 (two) times daily.   talazoparib tosylate  0.35 MG  capsule Commonly known as: Talzenna  Take 1 capsule (0.35 mg total) by mouth daily.        Allergies:  Allergies  Allergen Reactions   Lithium Other (See Comments)    Spinal fluid built up in brain   Dulaglutide  Nausea And Vomiting and Other (See Comments)    TRULICITY    Nitrofuran Derivatives Itching    Nitrofurantoin  Mono- MCR   Penicillins Other (See Comments)    UNKNOWN CHILDHOOD REACTION    Past Medical History, Surgical history, Social history, and Family History were reviewed and updated.  Review of Systems: Review of Systems  Constitutional:  Negative for malaise/fatigue.  HENT: Negative.    Eyes: Negative.   Respiratory: Negative.    Cardiovascular:  Negative for leg swelling.  Gastrointestinal:  Negative for nausea.  Genitourinary: Negative.   Musculoskeletal:  Positive for joint pain and myalgias.  Skin: Negative.   Neurological:  Negative for tingling.  Endo/Heme/Allergies: Negative.   Psychiatric/Behavioral: Negative.       Physical Exam:  height is 5' (1.524 m) and weight is 150 lb (68 kg). Her oral temperature is 98.3 F (36.8 C). Her blood pressure is 104/52 (abnormal) and her pulse is 100. Her respiration is 19 and oxygen saturation is 99%.   Wt Readings from Last 3 Encounters:  06/13/24 150 lb (68 kg)  05/30/24 141 lb (64 kg)  05/08/24 151 lb 1.8 oz (68.5 kg)    Physical Exam Vitals reviewed.  HENT:     Head: Normocephalic and atraumatic.     Comments: She does not have any swelling now.  I do not feel any firmness on the left side of her neck.  No adenopathy is noted.  She has no intraoral lesions.  Eyes:     Pupils: Pupils are equal, round, and reactive to light.  Cardiovascular:     Rate and Rhythm: Normal rate and regular rhythm.     Heart sounds: Normal heart sounds.  Pulmonary:     Effort: Pulmonary effort is normal.     Breath sounds: Normal breath sounds.  Abdominal:     General: Bowel sounds are normal. There is no distension.  Palpations: Abdomen is soft.     Tenderness: There is no abdominal tenderness. There is no guarding.  Musculoskeletal:        General: No deformity. Normal range of motion.     Cervical back: Normal range of motion.  Lymphadenopathy:     Cervical: No cervical adenopathy.  Skin:    General: Skin is warm and dry.     Coloration: Skin is not jaundiced or pale.     Findings: Erythema present. No bruising or rash.     Comments: There are two flat erythematic scabbed lesions of her left dorsal hand. No trailing erythema.   Neurological:     Mental Status: She is alert and oriented to person, place, and time.  Psychiatric:        Behavior: Behavior normal.        Thought Content: Thought content normal.        Judgment: Judgment normal.     Lab Results  Component Value Date   WBC 2.8 (L) 06/13/2024   HGB 8.8 (L) 06/13/2024   HCT 26.6 (L) 06/13/2024   MCV 109.5 (H) 06/13/2024   PLT 141 (L) 06/13/2024   Lab Results  Component Value Date   FERRITIN 168 04/24/2024   IRON 82 03/06/2024   TIBC 314 03/06/2024   UIBC 232 03/06/2024   IRONPCTSAT 26 03/06/2024   Lab Results  Component Value Date   RETICCTPCT 1.0 04/24/2024   RBC 2.43 (L) 06/13/2024   No results found for: KPAFRELGTCHN, LAMBDASER, KAPLAMBRATIO No results found for: IGGSERUM, IGA, IGMSERUM No results found for: STEPHANY CARLOTA BENSON MARKEL EARLA JOANNIE DOC VICK, SPEI   Chemistry      Component Value Date/Time   NA 138 06/13/2024 1110   K 4.2 06/13/2024 1110   CL 102 06/13/2024 1110   CO2 24 06/13/2024 1110   BUN 13 06/13/2024 1110   CREATININE 0.67 06/13/2024 1110      Component Value Date/Time   CALCIUM  9.1 06/13/2024 1110   ALKPHOS 80 06/13/2024 1110   AST 13 (L) 06/13/2024 1110   ALT 11 06/13/2024 1110   BILITOT 0.3 06/13/2024 1110     Encounter Diagnoses  Name Primary?   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  Recurrence was in the neck. She was treated with incredibly aggressive chemo radiation therapy.  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  Again this worked very very nicely. She is now on dose reduced Talzenna  and is back on her Xarelto .   Mild cytopenias from her Talzenna . She will continue at current dose at this time De-access today- no blood or platelets needed tomorrow I have sent her doxycycline  to our med center pharmacy which is in budget for patient. She will start her doxycyline today.  She will continue her Xarelto .    RTC 1 month MD, port labs (CBC, CMP, B12), +_ platelets    Lauraine CHRISTELLA Dais, PA-C 8/20/202512:47 PM

## 2024-06-14 ENCOUNTER — Ambulatory Visit: Admitting: Cardiology

## 2024-06-14 ENCOUNTER — Other Ambulatory Visit: Payer: Self-pay

## 2024-06-15 ENCOUNTER — Other Ambulatory Visit: Payer: Self-pay

## 2024-06-20 ENCOUNTER — Other Ambulatory Visit: Payer: Self-pay

## 2024-07-05 NOTE — Progress Notes (Deleted)
 Cardiology Office Note   Date:  07/05/2024  ID:  Ariana Herrera, DOB 1965-12-28, MRN [id number] PCP: Cyndi Shaver, PA-C  [facility] Providers Cardiologist:  Vinie JAYSON Maxcy, MD Cardiology APP:  Vicci Rollo SAUNDERS, PA-C   History of Present Illness Ariana Herrera is a 58 y.o. female with a past medical history of CAD, type 2 DM, HLD, asthma, breast cancer. She is followed by Dr. Maxcy and presents today for a follow up appointment    Per chart review patient previously underwent echocardiogram on 08/21/20 that showed EF 60-65%, no regional wall motion abnormalities, normal RV function. Underwent LHC on 08/22/20 that showed mild nonobstructive CAD. She was managed medically with lipitor, metoprolol , losartan , fenofibrate .   Patient was last seen by Dr. Maxcy on 07/30/22. At that time, patient was doing well from a cardiac perspective. No changes were made to her medications. I saw her in clinic on 09/20/23. At that time, she reported occasional episodes of chest pain once every few months, often during times of significant emotional stress. Discomfort was very brief and resolved on its own. As symptoms were very infrequent, were mild, and resolved on their own, we decided not to pursue ischemic evaluation. She remained on pravastatin , fenofibrate . Started metoprolol  tartrate for occasional tachycardia.    She is followed by oncology for breast cancer. Developed thrombocytopenia with platelets as low as 16 in 02/2024. Her xarelto  and talazoparib were held. 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 At that time, patient reported having frequent episodes of dizziness upon standing. No syncope. Symptoms improved if she stood up too quickly. Denied DOE, lower extremity swelling, palpitations. In clinic, her orthostatic vital signs were positive. Transitioned from metoprolol   tartrate to metoprolol  succinate 12.5 mg daily at bedtime. She was instructed to wear compression stockings, increase fluid and protein intake. In 05/01/24 she continued to have some dizziness upon standing. Metoprolol  was stopped. Follow up cardiac monitor from 04/2024 showed sinus rhythm with heart rate between 77 and 122 bpm.  Average heart rate 93 bpm.   Orthostatic hypotension  Sinus Tachycardia  - When seen in 08/2023, EKG showed sinus tachycardia with HR 115 BPM. She reported that her HR and BP had both been a bit elevated at her past few appointments. Started metoprolol  tartrate 25 mg BID. In 02/2024, she started having episodes of dizziness upon standing and low BP. Orthostatic vital signs in the office positive. Stopped metoprolol  tartrate and instead started metoprolol  succinate 12.5 mg at bedtime.  Metoprolol  was ultimately discontinued -3-day Zio after discontinuing metoprolol  showed sinus rhythm with heart rate ranging from 77-122 bpm, average heart rate 93 bpm -  - Instructed patient to start wearing compression stockings and increase hydration.  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  Hemoglobin as low as 6.8 on 7/1 - Likely her anemia is contributing to her dizziness and sinus tachycardia, dizziness -   Nonobstructive CAD  - Patient underwent cardiac catheterization in 07/2020 that showed mild nonobstructive CAD  - Patient denies chest pain or shortness of breath  - Continue statin therapy for risk reduction  - No ASA as patient is on xarelto     HLD  -  Lipid panel from 03/2024 showed LDL 10, HDL 31, triglycerides 192, total cholesterol 71  - Continue crestor  40 mg daily  - Continue fenofibrate  145 mg daily   ROS: ***  Studies Reviewed      *** Risk Assessment/Calculations {Does this patient have  ATRIAL FIBRILLATION?[phone]} No BP recorded.  {Refresh Note OR Click here to enter BP  :1}***       Physical Exam VS:  LMP 10/04/2016 Comment: after procedure to stop bleeding       Wt Readings from Last 3 Encounters:  06/13/24 150 lb (68 kg)  05/30/24 141 lb (64 kg)  05/08/24 151 lb 1.8 oz (68.5 kg)    GEN: Well nourished, well developed in no acute distress NECK: No JVD; No carotid bruits CARDIAC: ***RRR, no murmurs, rubs, gallops RESPIRATORY:  Clear to auscultation without rales, wheezing or rhonchi  ABDOMEN: Soft, non-tender, non-distended EXTREMITIES:  No edema; No deformity   ASSESSMENT AND PLAN ***    {Are you ordering a CV Procedure (e.g. stress test, cath, DCCV, TEE, etc)?   Press F2        :[id number]}  Dispo: ***  Signed, Rollo FABIENE Louder, PA-C

## 2024-07-10 ENCOUNTER — Other Ambulatory Visit (HOSPITAL_BASED_OUTPATIENT_CLINIC_OR_DEPARTMENT_OTHER): Payer: Self-pay

## 2024-07-10 ENCOUNTER — Other Ambulatory Visit: Payer: Self-pay | Admitting: Hematology & Oncology

## 2024-07-10 MED ORDER — HYDROCODONE-ACETAMINOPHEN 7.5-325 MG PO TABS
1.0000 | ORAL_TABLET | Freq: Four times a day (QID) | ORAL | 0 refills | Status: DC | PRN
  Filled 2024-07-10: qty 120, 30d supply, fill #0

## 2024-07-14 ENCOUNTER — Other Ambulatory Visit: Payer: Self-pay

## 2024-07-16 ENCOUNTER — Ambulatory Visit: Admitting: Cardiology

## 2024-07-16 NOTE — Progress Notes (Deleted)
 Cardiology Office Note   Date:  07/16/2024  ID:  Ariana Herrera, DOB [birth year], MRN [id number] PCP: Cyndi Shaver, PA-C (Inactive)  [facility] Providers Cardiologist:  Vinie JAYSON Maxcy, MD Cardiology APP:  Vicci Rollo SAUNDERS, PA-C   History of Present Illness Ariana Herrera is a 58 y.o. female with a past medical history of CAD, type 2 DM, HLD, asthma, breast cancer. She is followed by Dr. Maxcy and presents today for a follow up appointment    Per chart review patient previously underwent echocardiogram on 08/21/20 that showed EF 60-65%, no regional wall motion abnormalities, normal RV function. Underwent LHC on 08/22/20 that showed mild nonobstructive CAD. She was managed medically with lipitor, metoprolol , losartan , fenofibrate .   Patient was last seen by Dr. Maxcy on 07/30/22. At that time, patient was doing well from a cardiac perspective. No changes were made to her medications. I saw her in clinic on 09/20/23. At that time, she reported occasional episodes of chest pain once every few months, often during times of significant emotional stress. Discomfort was very brief and resolved on its own. As symptoms were very infrequent, were mild, and resolved on their own, we decided not to pursue ischemic evaluation. She remained on pravastatin , fenofibrate . Started metoprolol  tartrate for occasional tachycardia.    She is followed by oncology for breast cancer. Developed thrombocytopenia with platelets as low as 16 in 02/2024. Her xarelto  and talazoparib were held. 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 At that time, patient reported having frequent episodes of dizziness upon standing. No syncope. Symptoms improved if she stood up too quickly. Denied DOE, lower extremity swelling, palpitations. In clinic, her orthostatic vital signs were positive. Transitioned from  metoprolol  tartrate to metoprolol  succinate 12.5 mg daily at bedtime. She was instructed to wear compression stockings, increase fluid and protein intake. In 05/01/24 she continued to have some dizziness upon standing. Metoprolol  was stopped. Follow up cardiac monitor from 04/2024 showed sinus rhythm with heart rate between 77 and 122 bpm.  Average heart rate 93 bpm.   Orthostatic hypotension  Sinus Tachycardia  - When seen in 08/2023, EKG showed sinus tachycardia with HR 115 BPM. She reported that her HR and BP had both been a bit elevated at her past few appointments. Started metoprolol  tartrate 25 mg BID. In 02/2024, she started having episodes of dizziness upon standing and low BP. Orthostatic vital signs in the office positive. Stopped metoprolol  tartrate and instead started metoprolol  succinate 12.5 mg at bedtime.  Metoprolol  was ultimately discontinued -3-day Zio after discontinuing metoprolol  showed sinus rhythm with heart rate ranging from 77-122 bpm, average heart rate 93 bpm -  - Instructed patient to start wearing compression stockings and increase hydration.  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  Hemoglobin as low as 6.8 on 7/1 - Likely her anemia is contributing to her dizziness and sinus tachycardia, dizziness -   Nonobstructive CAD  - Patient underwent cardiac catheterization in 07/2020 that showed mild nonobstructive CAD  - Patient denies chest pain or shortness of breath  - Continue statin therapy for risk reduction  - No ASA as patient is on xarelto     HLD  -  Lipid panel from 03/2024 showed LDL 10, HDL 31, triglycerides 192, total cholesterol 71  - Continue crestor  40 mg daily  - Continue fenofibrate  145 mg daily   ROS: ***  Studies Reviewed      *** Risk Assessment/Calculations {Does this patient  have ATRIAL FIBRILLATION?:[phone]} No BP recorded.  {Refresh Note OR Click here to enter BP  :1}***       Physical Exam VS:  LMP 10/04/2016 Comment: after procedure to stop bleeding       Wt Readings from Last 3 Encounters:  06/13/24 150 lb (68 kg)  05/30/24 141 lb (64 kg)  05/08/24 151 lb 1.8 oz (68.5 kg)    GEN: Well nourished, well developed in no acute distress NECK: No JVD; No carotid bruits CARDIAC: ***RRR, no murmurs, rubs, gallops RESPIRATORY:  Clear to auscultation without rales, wheezing or rhonchi  ABDOMEN: Soft, non-tender, non-distended EXTREMITIES:  No edema; No deformity   ASSESSMENT AND PLAN ***    {Are you ordering a CV Procedure (e.g. stress test, cath, DCCV, TEE, etc)?   Press F2        :[id number]}  Dispo: ***  Signed, Rollo FABIENE Louder, PA-C

## 2024-07-20 ENCOUNTER — Encounter: Payer: Self-pay | Admitting: Hematology & Oncology

## 2024-07-20 ENCOUNTER — Inpatient Hospital Stay

## 2024-07-20 ENCOUNTER — Inpatient Hospital Stay: Admitting: Hematology & Oncology

## 2024-07-20 ENCOUNTER — Inpatient Hospital Stay: Attending: Hematology & Oncology

## 2024-07-20 VITALS — BP 89/50 | HR 102 | Temp 98.3°F | Resp 20 | Wt 147.9 lb

## 2024-07-20 DIAGNOSIS — D6959 Other secondary thrombocytopenia: Secondary | ICD-10-CM

## 2024-07-20 DIAGNOSIS — C7989 Secondary malignant neoplasm of other specified sites: Secondary | ICD-10-CM | POA: Diagnosis not present

## 2024-07-20 DIAGNOSIS — Z86718 Personal history of other venous thrombosis and embolism: Secondary | ICD-10-CM

## 2024-07-20 DIAGNOSIS — Z17421 Hormone receptor negative with human epidermal growth factor receptor 2 negative status: Secondary | ICD-10-CM | POA: Diagnosis not present

## 2024-07-20 DIAGNOSIS — E538 Deficiency of other specified B group vitamins: Secondary | ICD-10-CM | POA: Insufficient documentation

## 2024-07-20 DIAGNOSIS — F17210 Nicotine dependence, cigarettes, uncomplicated: Secondary | ICD-10-CM | POA: Insufficient documentation

## 2024-07-20 DIAGNOSIS — D6481 Anemia due to antineoplastic chemotherapy: Secondary | ICD-10-CM

## 2024-07-20 DIAGNOSIS — L03114 Cellulitis of left upper limb: Secondary | ICD-10-CM

## 2024-07-20 DIAGNOSIS — D701 Agranulocytosis secondary to cancer chemotherapy: Secondary | ICD-10-CM

## 2024-07-20 DIAGNOSIS — Z95828 Presence of other vascular implants and grafts: Secondary | ICD-10-CM

## 2024-07-20 DIAGNOSIS — D61818 Other pancytopenia: Secondary | ICD-10-CM | POA: Insufficient documentation

## 2024-07-20 DIAGNOSIS — C770 Secondary and unspecified malignant neoplasm of lymph nodes of head, face and neck: Secondary | ICD-10-CM

## 2024-07-20 DIAGNOSIS — C50012 Malignant neoplasm of nipple and areola, left female breast: Secondary | ICD-10-CM

## 2024-07-20 DIAGNOSIS — C50011 Malignant neoplasm of nipple and areola, right female breast: Secondary | ICD-10-CM

## 2024-07-20 LAB — RETIC PANEL
Immature Retic Fract: 28.7 % — ABNORMAL HIGH (ref 2.3–15.9)
RBC.: 2.29 MIL/uL — ABNORMAL LOW (ref 3.87–5.11)
Retic Count, Absolute: 73.3 K/uL (ref 19.0–186.0)
Retic Ct Pct: 3.2 % — ABNORMAL HIGH (ref 0.4–3.1)
Reticulocyte Hemoglobin: 36.4 pg (ref >27.9)

## 2024-07-20 LAB — CBC WITH DIFFERENTIAL (CANCER CENTER ONLY)
Abs Immature Granulocytes: 0.02 K/uL (ref 0.00–0.07)
Basophils Absolute: 0 K/uL (ref 0.0–0.1)
Basophils Relative: 0 %
Eosinophils Absolute: 0 K/uL (ref 0.0–0.5)
Eosinophils Relative: 1 %
HCT: 26.1 % — ABNORMAL LOW (ref 36.0–46.0)
Hemoglobin: 8.5 g/dL — ABNORMAL LOW (ref 12.0–15.0)
Immature Granulocytes: 1 %
Lymphocytes Relative: 25 %
Lymphs Abs: 0.6 K/uL — ABNORMAL LOW (ref 0.7–4.0)
MCH: 37.1 pg — ABNORMAL HIGH (ref 26.0–34.0)
MCHC: 32.6 g/dL (ref 30.0–36.0)
MCV: 114 fL — ABNORMAL HIGH (ref 80.0–100.0)
Monocytes Absolute: 0.3 K/uL (ref 0.1–1.0)
Monocytes Relative: 10 %
Neutro Abs: 1.6 K/uL — ABNORMAL LOW (ref 1.7–7.7)
Neutrophils Relative %: 63 %
Platelet Count: 96 K/uL — ABNORMAL LOW (ref 150–400)
RBC: 2.29 MIL/uL — ABNORMAL LOW (ref 3.87–5.11)
RDW: 18 % — ABNORMAL HIGH (ref 11.5–15.5)
WBC Count: 2.5 K/uL — ABNORMAL LOW (ref 4.0–10.5)
nRBC: 0 % (ref 0.0–0.2)

## 2024-07-20 LAB — CMP (CANCER CENTER ONLY)
ALT: 9 U/L (ref 0–44)
AST: 12 U/L — ABNORMAL LOW (ref 15–41)
Albumin: 3.9 g/dL (ref 3.5–5.0)
Alkaline Phosphatase: 81 U/L (ref 38–126)
Anion gap: 10 (ref 5–15)
BUN: 15 mg/dL (ref 6–20)
CO2: 25 mmol/L (ref 22–32)
Calcium: 9.1 mg/dL (ref 8.9–10.3)
Chloride: 103 mmol/L (ref 98–111)
Creatinine: 0.71 mg/dL (ref 0.44–1.00)
GFR, Estimated: >60 mL/min (ref >60)
Glucose, Bld: 120 mg/dL — ABNORMAL HIGH (ref 70–99)
Potassium: 4 mmol/L (ref 3.5–5.1)
Sodium: 137 mmol/L (ref 135–145)
Total Bilirubin: 0.3 mg/dL (ref 0.0–1.2)
Total Protein: 6.2 g/dL — ABNORMAL LOW (ref 6.5–8.1)

## 2024-07-20 LAB — IRON AND IRON BINDING CAPACITY (CC-WL,HP ONLY)
Iron: 48 ug/dL (ref 28–170)
Saturation Ratios: 16 % (ref 10.4–31.8)
TIBC: 298 ug/dL (ref 250–450)
UIBC: 250 ug/dL

## 2024-07-20 LAB — FERRITIN: Ferritin: 64 ng/mL (ref 11–307)

## 2024-07-20 LAB — VITAMIN B12: Vitamin B-12: 223 pg/mL (ref 180–914)

## 2024-07-20 NOTE — Patient Instructions (Signed)

## 2024-07-20 NOTE — Progress Notes (Signed)
 This is Hematology and Oncology Follow Up Visit  Ariana Herrera [id number] 1966-09-26 58 y.o. 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 daily-start on 01/18/2024 Talazoparib 1 mg p.o. daily-start on 12/22/2023 -  changed to 0.50 mg po q day on 03/06/2024 B12 oral 1,000 mcg once daily    Interim History:  Ariana Herrera is here today for follow-up.  She is still having some problems with the pancytopenia.  This I do think that the talazoparib is improving her situation.  However, her blood counts certainly have taken a hit.  I told her that we could try the talazoparib 3 weeks on and 1 week off.  I think this would certainly make sense.  She had a fairly extensive workup recently.  Ariana Herrera was concerned about some findings.  She had a MRI.  She had a CT angiogram.  Everything looked fine without any obvious evidence of recurrent disease..  She is on Xarelto .  We have had her on Xarelto  for quite a while.  She has had no problems with bowels or bladder.  She is still smoking.  She smoked about half pack per day.  She says that she has little bit of weakness in her legs.  She has lost some weight.  She may need a little bit of physical therapy.  I will have to figure out of how we can try to arrange this for her.  She has had no problems with pain.  She is on hydrocodone .  This does seem to be helping.  She has had no fever.  She has had no bleeding.   She has had little bit of a cough.  It is not productive.  Overall, I would say that her performance status is probably ECOG 1.  .    Wt Readings from Last 3 Encounters:  07/20/24 147 lb 14.4 oz (67.1 kg)  06/13/24 150 lb (68 kg)  05/30/24 141 lb (64 kg)    Medications:  Allergies as of 07/20/2024       Reactions   Lithium Other (See Comments)   Spinal fluid built up in brain   Dulaglutide  Nausea And Vomiting, Other (See Comments)   TRULICITY    Nitrofuran Derivatives Itching   Nitrofurantoin  Mono- MCR   Penicillins Other (See Comments)   UNKNOWN CHILDHOOD REACTION        Medication List        Accurate as of July 20, 2024 11:19 AM. If you have any questions, ask your nurse or doctor.          Accu-Chek Guide test strip Generic drug: glucose blood 3 (three) times daily.   albuterol  1.25 MG/3ML nebulizer solution Commonly known as: ACCUNEB  Take 1 ampule by nebulization every 6 (six) hours as needed for wheezing or shortness of breath.   albuterol  108 (90 Base) MCG/ACT inhaler Commonly known as: VENTOLIN  HFA Inhale 2 puffs into the lungs every 6 (six) hours as  needed for wheezing or shortness of breath.   azelastine  0.05 % ophthalmic solution Commonly known as: OPTIVAR  Place 1 drop into both eyes 2 (two) times daily.   cyclobenzaprine  5 MG tablet Commonly known as: FLEXERIL  Take 5 mg by mouth 2 (two) times daily as needed for muscle spasms.   Dexcom G7 Sensor Misc Change sensor every 10 days   fenofibrate  145 MG tablet Commonly known as: TRICOR  Take 145 mg by mouth daily.   gabapentin  300 MG capsule Commonly known as: NEURONTIN  Take 1 capsule (300 mg total) by mouth 3 (three) times daily.   gabapentin  400 MG capsule Commonly known as: Neurontin  Take 1 capsule (400 mg total) by mouth 3 (three) times daily.   HYDROcodone -acetaminophen  7.5-325 MG tablet Commonly known as: NORCO Take 1 tablet by mouth every 6 (six) hours as needed for moderate  pain (pain score 4-6).   hydrOXYzine 25 MG tablet Commonly known as: ATARAX Take 25 mg by mouth 4 (four) times daily.   lidocaine -prilocaine  cream Commonly known as: EMLA  Apply 1 Application topically daily as needed.   linaclotide  290 MCG Caps capsule Commonly known as: Linzess  Take 1 capsule (290 mcg total) by mouth daily before breakfast.   loratadine  10 MG tablet Commonly known as: CLARITIN  Take 1 tablet (10 mg total) by mouth daily.   lubiprostone  24 MCG capsule Commonly known as: AMITIZA  Take 1 capsule (24 mcg total) by mouth 2 (two) times daily with a meal.   metFORMIN  1000 MG tablet Commonly known as: GLUCOPHAGE  Take 1 tablet (1,000 mg total) by mouth in the morning and in the evening with meals.   miconazole  200 MG vaginal suppository Commonly known as: MICOTIN Place 1 suppository (200 mg total) vaginally at bedtime.   Movantik  25 MG Tabs tablet Generic drug: naloxegol  oxalate TAKE 1 TABLET (25 MG TOTAL) BY MOUTH DAILY.   nitroGLYCERIN  0.4 MG SL tablet Commonly known as: NITROSTAT  Place 1 tablet (0.4 mg total) under the tongue every 5 (five) minutes as needed for chest pain.   NovoLOG  100 UNIT/ML injection Generic drug: insulin  aspart Inject into the skin as directed.   Insulin  Aspart FlexPen 100 UNIT/ML Commonly known as: NOVOLOG  Inject into the skin.   insulin  aspart 100 UNIT/ML injection Commonly known as: novoLOG  Use as directed via insulin  pump. Total daily dose 100 units.   OLANZapine  10 MG tablet Commonly known as: ZYPREXA  TAKE 1 TABLET BY MOUTH EVERYDAY AT BEDTIME   Omnipod 5 DexG7G6 Pods Gen 5 Misc Apply one pod every 3 days to administer insulin  continuously.   Omnipod 5 DexG7G6 Pods Gen 5 Misc Apply 1 pod every 3 days for insulin  administration.   Omnipod 5 DexG7G6 Pods Gen 5 Misc Apply one pod every 3 days to administer insulin  continuously.   ondansetron  4 MG disintegrating tablet Commonly known as: ZOFRAN -ODT Take 1 tablet (4 mg  total) by mouth every 8 (eight) hours.   ondansetron  8 MG tablet Commonly known as: ZOFRAN  Take 1 tablet (8 mg total) by mouth every 8 (eight) hours as needed for nausea or vomiting. START ON THE THIRD DAY AFTER CHEMOTHERAPY.   oxybutynin  5 MG 24 hr tablet Commonly known as: DITROPAN -XL TAKE 1 TABLET BY MOUTH EVERYDAY AT BEDTIME   pantoprazole  40 MG tablet Commonly known as: PROTONIX  Take 1 tablet (40 mg total) by mouth daily.   PEG 3350  17 g Pack Take 17 g by mouth daily as needed.   prochlorperazine  10 MG tablet Commonly known as: COMPAZINE  Take 1 tablet (  10 mg total) by mouth every 6 (six) hours as needed for nausea or vomiting (Zofran  resumes 12/16).   QUEtiapine  400 MG tablet Commonly known as: SEROQUEL  TAKE 2 TABLETS (800 MG TOTAL) BY MOUTH AT BEDTIME.   rivaroxaban  10 MG Tabs tablet Commonly known as: XARELTO  Take 1 tablet (10 mg total) by mouth daily.   rosuvastatin  40 MG tablet Commonly known as: CRESTOR  Take 1 tablet (40 mg total) by mouth daily.   senna 8.6 MG Tabs tablet Commonly known as: SENOKOT Take 1 tablet (8.6 mg total) by mouth 2 (two) times daily.   talazoparib tosylate  0.35 MG capsule Commonly known as: Talzenna  Take 1 capsule (0.35 mg total) by mouth daily.        Allergies:  Allergies  Allergen Reactions   Lithium Other (See Comments)    Spinal fluid built up in brain   Dulaglutide  Nausea And Vomiting and Other (See Comments)    TRULICITY    Nitrofuran Derivatives Itching    Nitrofurantoin  Mono- MCR   Penicillins Other (See Comments)    UNKNOWN CHILDHOOD REACTION    Past Medical History, Surgical history, Social history, and Family History were reviewed and updated.  Review of Systems: Review of Systems  Constitutional:  Negative for malaise/fatigue.  HENT: Negative.    Eyes: Negative.   Respiratory: Negative.    Cardiovascular:  Negative for leg swelling.  Gastrointestinal:  Negative for nausea.  Genitourinary: Negative.    Musculoskeletal:  Positive for joint pain and myalgias.  Skin: Negative.   Neurological:  Negative for tingling.  Endo/Heme/Allergies: Negative.   Psychiatric/Behavioral: Negative.       Physical Exam:  weight is 147 lb 14.4 oz (67.1 kg). Her oral temperature is 98.3 F (36.8 C). Her blood pressure is 89/50 (abnormal) and her pulse is 102 (abnormal). Her respiration is 20 and oxygen saturation is 99%.   Wt Readings from Last 3 Encounters:  07/20/24 147 lb 14.4 oz (67.1 kg)  06/13/24 150 lb (68 kg)  05/30/24 141 lb (64 kg)    Physical Exam Vitals reviewed.  HENT:     Head: Normocephalic and atraumatic.     Comments: She does not have any swelling now.  I do not feel any firmness on the left side of her neck.  No adenopathy is noted.  She has no intraoral lesions.  Eyes:     Pupils: Pupils are equal, round, and reactive to light.  Cardiovascular:     Rate and Rhythm: Normal rate and regular rhythm.     Heart sounds: Normal heart sounds.  Pulmonary:     Effort: Pulmonary effort is normal.     Breath sounds: Normal breath sounds.  Abdominal:     General: Bowel sounds are normal. There is no distension.     Palpations: Abdomen is soft.     Tenderness: There is no abdominal tenderness. There is no guarding.  Musculoskeletal:        General: No deformity. Normal range of motion.     Cervical back: Normal range of motion.  Lymphadenopathy:     Cervical: No cervical adenopathy.  Skin:    General: Skin is warm and dry.     Coloration: Skin is not jaundiced or pale.     Findings: Erythema present. No bruising or rash.     Comments: There are two flat erythematic scabbed lesions of her left dorsal hand. No trailing erythema.   Neurological:     Mental Status: She is alert and oriented to  person, place, and time.  Psychiatric:        Behavior: Behavior normal.        Thought Content: Thought content normal.        Judgment: Judgment normal.     Lab Results  Component Value  Date   WBC 2.5 (L) 07/20/2024   HGB 8.5 (L) 07/20/2024   HCT 26.1 (L) 07/20/2024   MCV 114.0 (H) 07/20/2024   PLT 96 (L) 07/20/2024   Lab Results  Component Value Date   FERRITIN 168 04/24/2024   IRON 82 03/06/2024   TIBC 314 03/06/2024   UIBC 232 03/06/2024   IRONPCTSAT 26 03/06/2024   Lab Results  Component Value Date   RETICCTPCT 3.2 (H) 07/20/2024   RBC 2.29 (L) 07/20/2024   RBC 2.29 (L) 07/20/2024   No results found for: KPAFRELGTCHN, LAMBDASER, KAPLAMBRATIO No results found for: IGGSERUM, IGA, IGMSERUM No results found for: STEPHANY CARLOTA BENSON MARKEL EARLA JOANNIE DOC VICK, SPEI   Chemistry      Component Value Date/Time   NA 137 07/20/2024 1017   K 4.0 07/20/2024 1017   CL 103 07/20/2024 1017   CO2 25 07/20/2024 1017   BUN 15 07/20/2024 1017   CREATININE 0.71 07/20/2024 1017      Component Value Date/Time   CALCIUM  9.1 07/20/2024 1017   ALKPHOS 81 07/20/2024 1017   AST 12 (L) 07/20/2024 1017   ALT 9 07/20/2024 1017   BILITOT 0.3 07/20/2024 1017      Impression and Plan: Ariana Herrera is a very pleasant  58 yo caucasian female with recurrent ductal carcinoma of the left breast.  Recurrence was in the neck. She was treated with incredibly aggressive chemo radiation therapy.  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  Again this worked very very nicely. She is now on dose reduced Talzenna  and is back on her Xarelto .   Again, I wrote down for how to take the talazoparib.  Again we will try to avoid having to transfuse her.  We will see what her CA 27.29 level is.  At some point, we may need to do more scans on her.  I would like to have her come back in about 3 weeks or so.  We will see what her blood counts are.  I told her to stop the talazoparib for 1 week.  She will then restart the talazoparib for 3 weeks.  I wrote this down for her.   Ariana JONELLE Crease,  Ariana Herrera 9/26/202511:19 AM

## 2024-07-21 LAB — CANCER ANTIGEN 27.29: CA 27.29: 30.4 U/mL (ref 0.0–38.6)

## 2024-07-22 ENCOUNTER — Other Ambulatory Visit: Payer: Self-pay

## 2024-07-24 ENCOUNTER — Other Ambulatory Visit: Payer: Self-pay

## 2024-07-24 ENCOUNTER — Ambulatory Visit: Admitting: Cardiology

## 2024-07-26 NOTE — Therapy (Signed)
 OUTPATIENT PHYSICAL THERAPY LOWER EXTREMITY EVALUATION   Patient Name: Ariana Herrera MRN: [id number] DOB: [birth year], 58 y.o., female Today's Date: 07/30/2024   END OF SESSION:  PT End of Session - 07/30/24 1315     Visit Number 1    Date for Recertification  10/22/24    Authorization Type [employer]    PT Start Time 1315    PT Stop Time 1401    PT Time Calculation (min) 46 min    Activity Tolerance Patient tolerated treatment well;Patient limited by fatigue    Behavior During Therapy WFL for tasks assessed/performed          Past Medical History:  Diagnosis Date   Anginal pain    Anxiety    Arthritis    Asthma    Bipolar 1 disorder (HCC)    Bipolar disease, chronic (HCC)    Breast cancer in female (HCC)    2019 and recurrent in 2023 to right breast, right axillary, and left neck   Coronary artery disease    Deep vein blood clot of right lower extremity (HCC)    Diabetes mellitus type 2 in obese    Goals of care, counseling/discussion 11/23/2021   History of external beam radiation therapy    Left neck-10/07/23-11/24/23-[facility]   Hyperlipidemia    Lymphedema    MI (myocardial infarction) (HCC)    was in [place]    Obesity    Pancreatitis    Personal history of chemotherapy    Personal history of radiation therapy    Sleep apnea    Past Surgical History:  Procedure Laterality Date   BREAST BIOPSY Left 10/03/2023   times 2   BREAST LUMPECTOMY Left 03/2019   CATARACT EXTRACTION Bilateral    CHOLECYSTECTOMY     IR IMAGING GUIDED PORT INSERTION  12/15/2021   IR PATIENT EVAL TECH 0-60 MINS  10/06/2023   IR REMOVAL TUN ACCESS W/ PORT W/O FL MOD SED  12/20/2019   IR US  GUIDE BX ASP/DRAIN  11/02/2021   LEFT HEART CATH AND CORONARY ANGIOGRAPHY N/A 08/22/2020   Procedure: LEFT HEART CATH AND CORONARY ANGIOGRAPHY;  Surgeon: Verlin Lonni BIRCH, MD;  Location: [facility];  Service: Cardiovascular;  Laterality: N/A;   TRIGGER FINGER RELEASE  Bilateral    x 5   TUBAL LIGATION     UMBILICAL HERNIA REPAIR     x 2   uterine ablation     Patient Active Problem List   Diagnosis Date Noted   Internal jugular (IJ) vein thromboembolism, acute (HCC) 10/06/2023   Facial swelling 10/05/2023   Tobacco use 07/14/2023   Type 2 diabetes mellitus with hyperglycemia, with long-term current use of insulin  (HCC) 06/23/2023   Stress incontinence of urine 06/23/2023   Moderate persistent asthma without complication 06/23/2023   Obesity (BMI 30-39.9) 06/23/2023   Encounter for orthopedic follow-up care 09/23/2021   Acquired trigger finger of left middle finger 08/27/2021   Chest pain of uncertain etiology    History of MI (myocardial infarction) 08/20/2020   History of pancreatitis 08/20/2020   Hyperlipidemia associated with type 2 diabetes mellitus (HCC) 08/20/2020   Bipolar disease, chronic (HCC)    Breast cancer (HCC) 11/26/2019   Chemotherapy-induced neuropathy 11/26/2019   History of DVT (deep vein thrombosis) 11/26/2019    PCP: Cyndi Shaver, PA-C (Inactive)   REFERRING PROVIDER: Timmy Maude SAUNDERS, MD   REFERRING DIAG: C50.012 (ICD-10-CM) - Malignant neoplasm involving both nipple and areola of left breast in female, unspecified  estrogen receptor status (HCC)   Per MD order: She is a bit deconditioned. She has lost weight. She does have breast cancer. I think she just needs some strengthening exercises for her legs.   THERAPY DIAG:  Muscle weakness (generalized)  Difficulty in walking, not elsewhere classified  Unsteadiness on feet  RATIONALE FOR EVALUATION AND TREATMENT: Rehabilitation  ONSET DATE: ~Dec 2024  NEXT MD VISIT: 08/24/24   SUBJECTIVE:                                                                                                                                                                                                         SUBJECTIVE STATEMENT: Pt reports her cancer returned in Dec 2024 and ever  since she has been losing ~10# per month and she feels like her muscles have gone with the weight.  Now has difficulty with housework and working the yard or garden.  Unable to go shopping w/o using an electric cart. Also reports nerve pain at times.  She is in remission but does have terminal metastatic breast cancer, currently taking oral chemo - 3 weeks on/1 week off.  She states her blood counts are currently low.  PAIN: Are you having pain? No  PERTINENT HISTORY:  Angina; anxiety; arthritis - knee, hips, back and neck (4 bulging discs in neck); asthma; bipolar 1; breast cancer 2019 and recurrent 2023 to right breast, right axillary, and left neck; R LE DVT; DM-II; HLD; MI; pancreatitis; dizziness  PRECAUTIONS: None  RED FLAGS: None  WEIGHT BEARING RESTRICTIONS: No  FALLS:  Has patient fallen in last 6 months? No  LIVING ENVIRONMENT: Lives with: lives with their spouse and lives with their son Lives in: Mobile home - 5th wheel Stairs: Yes: External: 3 steps; on left going up Has following equipment at home: Single point cane, Environmental consultant - 2 wheeled, Wheelchair (manual), and shower chair  OCCUPATION: On disability  PLOF: Independent with basic ADLs, Needs assistance with homemaking, and Leisure: gardening, travel - unable to participate in leisure activities right now  PATIENT GOALS: Be able to stand longer periods of time - get strength back in my legs. Walk around easier.   OBJECTIVE: (objective measures completed at initial evaluation unless otherwise dated)  DIAGNOSTIC FINDINGS:  06/11/24 - CT ANGIOGRAPHY CHEST WITH CONTRAST IMPRESSION: 1. Tiny subsegmental pulmonary embolism in a posterior left lower lobe pulmonary artery. No evidence of right heart strain. 2. Small pericardial effusion. 3. Postsurgical changes in the left breast with diffuse dermal and trabecular thickening similar to prior. Findings in the neck reported  separately. 4. Unchanged vague nodularity in the  left supraclavicular region. 5. Aortic Atherosclerosis (ICD10-I70.0).   06/03/24 - MR NECK WITH AND WITHOUT INTRAVENOUS CONTRAST IMPRESSION: 1. No acute findings within the soft tissues of the neck. 2. Mild to moderate degenerative changes in the cervical spine as described above. No abnormal enhancement of the spinal cord or nerve roots. 3. Interval resolution of left supraclavicular lymphadenopathy and soft tissue stranding.  03/02/24 - MRI HEAD WITHOUT AND WITH CONTRAST IMPRESSION: No acute intracranial abnormality. No findings to suggest intracranial metastatic disease.   Punctate susceptibility in the right parietal white matter slightly more conspicuous on current study likely due to differences in technique. No associated edema or enhancement. Findings likely reflect chronic microhemorrhages. Recommend attention on follow-up.   Similar 7 mm dural-based enhancing lesion over the left middle frontal gyrus suggestive of a small meningioma.   Trace mastoid effusions.  PATIENT SURVEYS:  LEFS  Extreme difficulty/unable (0), Quite a bit of difficulty (1), Moderate difficulty (2), Little difficulty (3), No difficulty (4) Survey date:  07/30/24  Any of your usual work, housework or school activities 1  2. Usual hobbies, recreational or sporting activities 0  3. Getting into/out of the bath 1  4. Walking between rooms 2  5. Putting on socks/shoes 4  6. Squatting  0  7. Lifting an object, like a bag of groceries from the floor 2  8. Performing light activities around your home 1  9. Performing heavy activities around your home 0  10. Getting into/out of a car 4  11. Walking 2 blocks 0  12. Walking 1 mile 0  13. Going up/down 10 stairs (1 flight) 1  14. Standing for 1 hour 3  15.  sitting for 1 hour 0  16. Running on even ground 0  17. Running on uneven ground 0  18. Making sharp turns while running fast 0  19. Hopping  1  20. Rolling over in bed 2  Score total:  22 / 80 =  27.5 %  Functional limitation:   moderate     COGNITION: Overall cognitive status: Impaired (Chemo brain)  SENSATION: Chemo-based neuropathy in B hands and feet; nerve pain in legs  EDEMA:  Fluctuating edema in feet  POSTURE:  No Significant postural limitations  MUSCLE LENGTH: Hamstrings: mild tight B Piriformis: mod/severe tight R, mild tight L  LOWER EXTREMITY ROM: Grossly WFL other than limitation in hip rotation R>L  LOWER EXTREMITY MMT:  MMT Right eval Left eval  Hip flexion 4- 4-  Hip extension 3+ 4-  Hip abduction 3+ 3+  Hip adduction 3- 3-  Hip internal rotation 4 4  Hip external rotation 4- 4-  Knee flexion 4- 4-  Knee extension 4 4  Ankle dorsiflexion 4 4-  Ankle plantarflexion 3 (4 SLS HR) 3 (4 SLS HR)  Ankle inversion    Ankle eversion     (Blank rows = not tested)  FUNCTIONAL TESTS:  5 times sit to stand: 30.87 sec (several tries/attempts necessary on some reps) Timed up and go (TUG): 13.84 sec 10 meter walk test: 11.32 sec  Gait speed: 2.90 ft/sec Berg Balance Scale: TBA Functional gait assessment: TBA Interpretation of scores: Non-Specific Older Adults Cutoff Score: <=22/30 = risk of falls Parkinson's Disease Cutoff score <15/30= fall risk (Hoehn & Yahr 1-4)  Minimally Clinically Important Difference (MCID)  Stroke (acute, subacute, and chronic) = MDC: 4.2 points Vestibular (acute) = MDC: 6 points Community Dwelling Older Adults =  MCID:  4 points Parkinson's Disease  =  MDC: 4.3 points  (Academy of Neu[employer]ic Physical Therapy (nd). Functional Gait Assessment. Retrieved from https://www.neuropt.org/docs/default-source/cpgs/core-outcome-measures/function-gait-assessment-pocket-guide-proof9-(2).pdf?sfvrsn=b69f35043_0.)  GAIT: Distance walked: clinic distances Assistive device utilized: None Level of assistance: Complete Independence and SBA Gait pattern: decreased gait velocity, decreased stride length, lateral hip instability, and wide  BOS   TODAY'S TREATMENT:   07/30/2024  SELF CARE:  Reviewed eval findings and role of PT in addressing identified deficits as well as instruction in initial HEP (see below).    PATIENT EDUCATION:  Education details: PT eval findings, anticipated POC, need for further assessment of standardized balance testing, and initial HEP  Person educated: Patient Education method: Explanation, Demonstration, Verbal cues, and Handouts Education comprehension: verbalized understanding, returned demonstration, verbal cues required, and needs further education  HOME EXERCISE PROGRAM: Access Code: [email or web address] URL: [email or web address] Date: 07/30/2024 Prepared by: Elijah Hidden  Exercises - Seated Heel Toe Raises  - 1 x daily - 5-7 x weekly - 1-2 sets - 8-10 reps - 3 sec hold - Seated March with Resistance  - 1 x daily - 5-7 x weekly - 1-2 sets - 8-10 reps - 3 sec hold - Seated Hip Abduction with Resistance  - 1 x daily - 5-7 x weekly - 1-2 sets - 8-10 reps - 3-5 sec hold - Seated Hip Adduction Isometrics with Ball  - 1 x daily - 5-7 x weekly - 1-2 sets - 8-10 reps - 3-5 sec hold - Seated Long Arc Quad  - 1 x daily - 5-7 x weekly - 1-2 sets - 8-10 reps - 3 sec hold   ASSESSMENT:  CLINICAL IMPRESSION: TWYLA DAIS is a 58 y.o. female who was referred to physical therapy for evaluation and treatment for LE weakness and deconditioning 2 metastatic breast cancer.  She reports she experienced a recurrence of her cancer in December 2024 and ever since she has been losing ~10# per month and she feels like some of the weight loss has been related to muscle mass loss leading to generalized weakness and poor endurance with ADLs, daily mobility and household tasks.  Her energy levels are also impacted by low blood counts and she also experiences chemo-induced peripheral neuropathy as well as intermittent nerve pain.  Patient has deficits in hip ROM, B LE flexibility, B LE strength, and impaired  balance and activity tolerance which are interfering with ADLs and are impacting quality of life.  On LEFS patient scored 22/80 demonstrating moderate functional limitation.  Janika Jedlicka will benefit from skilled PT to address above deficits to improve mobility and activity tolerance with decreased pain interference.  OBJECTIVE IMPAIRMENTS: Abnormal gait, cardiopulmonary status limiting activity, decreased activity tolerance, decreased endurance, decreased knowledge of use of DME, decreased mobility, difficulty walking, decreased strength, decreased safety awareness, dizziness, impaired flexibility, impaired sensation, postural dysfunction, and pain.   ACTIVITY LIMITATIONS: carrying, lifting, bending, sitting, standing, squatting, stairs, transfers, bed mobility, bathing, dressing, hygiene/grooming, and locomotion level  PARTICIPATION LIMITATIONS: meal prep, cleaning, laundry, driving, shopping, community activity, and yard work  PERSONAL FACTORS: Fitness, Past/current experiences, Time since onset of injury/illness/exacerbation, and 3+ comorbidities: Angina; anxiety; arthritis - knee, hips, back and neck (4 bulging discs in neck); asthma; bipolar 1; breast cancer 2019 and recurrent 2023 to right breast, right axillary, and left neck; R LE DVT; DM-II; HLD; MI; pancreatitis; dizziness are also affecting patient's functional outcome.   REHAB POTENTIAL: Good  CLINICAL DECISION MAKING: Unstable/unpredictable  EVALUATION COMPLEXITY: High   GOALS: Goals reviewed with patient?  Yes  SHORT TERM GOALS: Target date: 09/10/2024  Patient will be independent with initial HEP. Baseline: Initial HEP provided on eval Goal status: INITIAL  2.  Complete standardized balance testing and update LTG's accordingly. Baseline: Berg & FGA pending Goal status: INITIAL  3.  Patient will improve 5x STS time to </= 24 seconds for improved efficiency and safety with transfers. Baseline: 30.87 sec Goal status:  INITIAL  LONG TERM GOALS: Target date: 10/22/2024  Patient will be independent with advanced/ongoing HEP to improve outcomes and carryover.  Baseline:  Goal status: INITIAL  2.  Patient will demonstrate improved B LE strength to >/= 4 to 4+/5 for improved stability and ease of mobility. Baseline: Refer to above LE MMT table Goal status: INITIAL  3.  Patient will be able to ambulate 600' w/o AD and normal gait pattern without increased pain to access community.  Baseline: very limited ambulation tolerance  Goal status: INITIAL  4. Patient will report >/= 35/80 on LEFS (MCID = 9 pts) to demonstrate improved functional ability. Baseline: 22 / 80 = 27.5 % Goal status: INITIAL  5.  Patient will demonstrate at least 19/30 on FGA to decrease risk of falls. Baseline: TBA Goal status: INITIAL   6.  Patient will improve Berg score by at least 8 points to improve safety and stability with ADLs in standing and reduce risk for falls Baseline: TBA Goal status: INITIAL   PLAN:  PT FREQUENCY: 2x/week  PT DURATION: 12 weeks  PLANNED INTERVENTIONS: [id number]- PT Re-evaluation, 97750- Physical Performance Testing, 97110-Therapeutic exercises, 97530- Therapeutic activity, 97112- Neuromuscular re-education, 97535- Self Care, [id number]- Manual therapy, [id number]- Gait training, [phone]- Aquatic Therapy, [phone]- Electrical stimulation (unattended), [id number]- Ultrasound, [id number]- Ionotophoresis 4mg /ml Dexamethasone , [id number] (1-2 muscles), 20561 (3+ muscles)- Dry Needling, Patient/Family education, Balance training, Stair training, Taping, Vestibular training, DME instructions, Cryotherapy, and Moist heat  PLAN FOR NEXT SESSION: Complete standardized balance assessment with Berg and FGA; review initial HEP - gently progress core/lumbopelvic and LE strengthening   Elijah CHRISTELLA Hidden, PT 07/30/2024, 5:30 PM

## 2024-07-30 ENCOUNTER — Encounter: Payer: Self-pay | Admitting: Physical Therapy

## 2024-07-30 ENCOUNTER — Ambulatory Visit: Attending: Hematology & Oncology | Admitting: Physical Therapy

## 2024-07-30 ENCOUNTER — Other Ambulatory Visit: Payer: Self-pay

## 2024-07-30 DIAGNOSIS — R2681 Unsteadiness on feet: Secondary | ICD-10-CM | POA: Diagnosis present

## 2024-07-30 DIAGNOSIS — R262 Difficulty in walking, not elsewhere classified: Secondary | ICD-10-CM | POA: Insufficient documentation

## 2024-07-30 DIAGNOSIS — C50012 Malignant neoplasm of nipple and areola, left female breast: Secondary | ICD-10-CM | POA: Diagnosis not present

## 2024-07-30 DIAGNOSIS — M6281 Muscle weakness (generalized): Secondary | ICD-10-CM | POA: Insufficient documentation

## 2024-07-31 ENCOUNTER — Other Ambulatory Visit: Payer: Self-pay

## 2024-08-01 ENCOUNTER — Encounter: Payer: Self-pay | Admitting: Physical Therapy

## 2024-08-01 ENCOUNTER — Ambulatory Visit: Admitting: Physical Therapy

## 2024-08-01 DIAGNOSIS — R2681 Unsteadiness on feet: Secondary | ICD-10-CM

## 2024-08-01 DIAGNOSIS — M6281 Muscle weakness (generalized): Secondary | ICD-10-CM | POA: Diagnosis not present

## 2024-08-01 DIAGNOSIS — R262 Difficulty in walking, not elsewhere classified: Secondary | ICD-10-CM

## 2024-08-01 NOTE — Therapy (Addendum)
 OUTPATIENT PHYSICAL THERAPY TREATMENT   Patient Name: Ariana Herrera MRN: [id number] DOB: 01-Apr-1966, 58 y.o., female Today's Date: 08/01/2024   END OF SESSION:  PT End of Session - 08/01/24 1017     Visit Number 2    Date for Recertification  10/22/24    Authorization Type [facility] Medicaid    Authorization Time Period Auth starting 08/25/24    PT Start Time 1015    PT Stop Time 1056    PT Time Calculation (min) 41 min    Activity Tolerance Patient tolerated treatment well;Patient limited by fatigue;Patient limited by pain    Behavior During Therapy [facility] for tasks assessed/performed           Past Medical History:  Diagnosis Date   Anginal pain    Anxiety    Arthritis    Asthma    Bipolar 1 disorder (HCC)    Bipolar disease, chronic (HCC)    Breast cancer in female (HCC)    2019 and recurrent in 2023 to right breast, right axillary, and left neck   Coronary artery disease    Deep vein blood clot of right lower extremity (HCC)    Diabetes mellitus type 2 in obese    Goals of care, counseling/discussion 11/23/2021   History of external beam radiation therapy    Left neck-10/07/23-11/24/23-[facility]ard   Hyperlipidemia    Lymphedema    MI (myocardial infarction) (HCC)    was in [place]    Obesity    Pancreatitis    Personal history of chemotherapy    Personal history of radiation therapy    Sleep apnea    Past Surgical History:  Procedure Laterality Date   BREAST BIOPSY Left 10/03/2023   times 2   BREAST LUMPECTOMY Left 03/2019   CATARACT EXTRACTION Bilateral    CHOLECYSTECTOMY     IR IMAGING GUIDED PORT INSERTION  12/15/2021   IR PATIENT EVAL TECH 0-60 MINS  10/06/2023   IR REMOVAL TUN ACCESS W/ PORT W/O FL MOD SED  12/20/2019   IR US  GUIDE BX ASP/DRAIN  11/02/2021   LEFT HEART CATH AND CORONARY ANGIOGRAPHY N/A 08/22/2020   Procedure: LEFT HEART CATH AND CORONARY ANGIOGRAPHY;  Surgeon: Verlin Lonni BIRCH, MD;  Location: [facility] INVASIVE [facility];  Service:  Cardiovascular;  Laterality: N/A;   TRIGGER FINGER RELEASE Bilateral    x 5   TUBAL LIGATION     UMBILICAL HERNIA REPAIR     x 2   uterine ablation     Patient Active Problem List   Diagnosis Date Noted   Internal jugular (IJ) vein thromboembolism, acute (HCC) 10/06/2023   Facial swelling 10/05/2023   Tobacco use 07/14/2023   Type 2 diabetes mellitus with hyperglycemia, with long-term current use of insulin  (HCC) 06/23/2023   Stress incontinence of urine 06/23/2023   Moderate persistent asthma without complication 06/23/2023   Obesity (BMI 30-39.9) 06/23/2023   Encounter for orthopedic follow-up care 09/23/2021   Acquired trigger finger of left middle finger 08/27/2021   Chest pain of uncertain etiology    History of MI (myocardial infarction) 08/20/2020   History of pancreatitis 08/20/2020   Hyperlipidemia associated with type 2 diabetes mellitus (HCC) 08/20/2020   Bipolar disease, chronic (HCC)    Breast cancer (HCC) 11/26/2019   Chemotherapy-induced neuropathy 11/26/2019   History of DVT (deep vein thrombosis) 11/26/2019    PCP: Cyndi Shaver, PA-C (Inactive)   REFERRING PROVIDER: Timmy Maude SAUNDERS, MD   REFERRING DIAG: [phone] (ICD-10-CM) - Malignant neoplasm  involving both nipple and areola of left breast in female, unspecified estrogen receptor status (HCC)   Per MD order: She is a bit deconditioned. She has lost weight. She does have breast cancer. I think she just needs some strengthening exercises for her legs.   THERAPY DIAG:  Muscle weakness (generalized)  Difficulty in walking, not elsewhere classified  Unsteadiness on feet  RATIONALE FOR EVALUATION AND TREATMENT: Rehabilitation  ONSET DATE: ~Dec 2024  NEXT MD VISIT: 08/24/24   SUBJECTIVE:                                                                                                                                                                                                         SUBJECTIVE  STATEMENT: Pt reports she is not feeling well from her chemo today and her legs are hurting.   EVAL: Pt reports her cancer returned in Dec 2024 and ever since she has been losing ~10# per month and she feels like her muscles have gone with the weight.  Now has difficulty with housework and working the yard or garden.  Unable to go shopping w/o using an electric cart. Also reports nerve pain at times.  She is in remission but does have terminal metastatic breast cancer, currently taking oral chemo - 3 weeks on/1 week off.  She states her blood counts are currently low.  PAIN: Are you having pain? Yes: NPRS scale: 6/10  Pain location: B LE  Pain description: ache  Aggravating factors: walking  Relieving factors: laying down, sitting   PERTINENT HISTORY:  Angina; anxiety; arthritis - knee, hips, back and neck (4 bulging discs in neck); asthma; bipolar 1; breast cancer 2019 and recurrent 2023 to right breast, right axillary, and left neck; R LE DVT; DM-II; HLD; MI; pancreatitis; dizziness  PRECAUTIONS: None  RED FLAGS: None  WEIGHT BEARING RESTRICTIONS: No  FALLS:  Has patient fallen in last 6 months? No  LIVING ENVIRONMENT: Lives with: lives with their spouse and lives with their son Lives in: Mobile home - 5th wheel Stairs: Yes: External: 3 steps; on left going up Has following equipment at home: Single point cane, Environmental consultant - 2 wheeled, Wheelchair (manual), and shower chair  OCCUPATION: On disability  PLOF: Independent with basic ADLs, Needs assistance with homemaking, and Leisure: gardening, travel - unable to participate in leisure activities right now  PATIENT GOALS: Be able to stand longer periods of time - get strength back in my legs. Walk around easier.   OBJECTIVE: (objective measures completed at initial evaluation unless otherwise dated)  DIAGNOSTIC FINDINGS:  06/11/24 - CT ANGIOGRAPHY CHEST WITH CONTRAST IMPRESSION: 1. Tiny subsegmental pulmonary embolism in a  posterior left lower lobe pulmonary artery. No evidence of right heart strain. 2. Small pericardial effusion. 3. Postsurgical changes in the left breast with diffuse dermal and trabecular thickening similar to prior. Findings in the neck reported separately. 4. Unchanged vague nodularity in the left supraclavicular region. 5. Aortic Atherosclerosis (ICD10-I70.0).   06/03/24 - MR NECK WITH AND WITHOUT INTRAVENOUS CONTRAST IMPRESSION: 1. No acute findings within the soft tissues of the neck. 2. Mild to moderate degenerative changes in the cervical spine as described above. No abnormal enhancement of the spinal cord or nerve roots. 3. Interval resolution of left supraclavicular lymphadenopathy and soft tissue stranding.  03/02/24 - MRI HEAD WITHOUT AND WITH CONTRAST IMPRESSION: No acute intracranial abnormality. No findings to suggest intracranial metastatic disease.   Punctate susceptibility in the right parietal white matter slightly more conspicuous on current study likely due to differences in technique. No associated edema or enhancement. Findings likely reflect chronic microhemorrhages. Recommend attention on follow-up.   Similar 7 mm dural-based enhancing lesion over the left middle frontal gyrus suggestive of a small meningioma.   Trace mastoid effusions.  PATIENT SURVEYS:  LEFS  Extreme difficulty/unable (0), Quite a bit of difficulty (1), Moderate difficulty (2), Little difficulty (3), No difficulty (4) Survey date:  07/30/24  Any of your usual work, housework or school activities 1  2. Usual hobbies, recreational or sporting activities 0  3. Getting into/out of the bath 1  4. Walking between rooms 2  5. Putting on socks/shoes 4  6. Squatting  0  7. Lifting an object, like a bag of groceries from the floor 2  8. Performing light activities around your home 1  9. Performing heavy activities around your home 0  10. Getting into/out of a car 4  11. Walking 2 blocks 0   12. Walking 1 mile 0  13. Going up/down 10 stairs (1 flight) 1  14. Standing for 1 hour 3  15.  sitting for 1 hour 0  16. Running on even ground 0  17. Running on uneven ground 0  18. Making sharp turns while running fast 0  19. Hopping  1  20. Rolling over in bed 2  Score total:  22 / 80 = 27.5 %  Functional limitation:   moderate     COGNITION: Overall cognitive status: Impaired (Chemo brain)  SENSATION: Chemo-based neuropathy in B hands and feet; nerve pain in legs  EDEMA:  Fluctuating edema in feet  POSTURE:  No Significant postural limitations  MUSCLE LENGTH: Hamstrings: mild tight B Piriformis: mod/severe tight R, mild tight L  LOWER EXTREMITY ROM: Grossly WFL other than limitation in hip rotation R>L  LOWER EXTREMITY MMT:  MMT Right eval Left eval  Hip flexion 4- 4-  Hip extension 3+ 4-  Hip abduction 3+ 3+  Hip adduction 3- 3-  Hip internal rotation 4 4  Hip external rotation 4- 4-  Knee flexion 4- 4-  Knee extension 4 4  Ankle dorsiflexion 4 4-  Ankle plantarflexion 3 (4 SLS HR) 3 (4 SLS HR)  Ankle inversion    Ankle eversion     (Blank rows = not tested)  FUNCTIONAL TESTS:  5 times sit to stand: 30.87 sec (several tries/attempts necessary on some reps) Timed up and go (TUG): 13.84 sec 10 meter walk test: 11.32 sec  Gait speed: 2.90 ft/sec Berg Balance Scale: 08/01/24 - 35/56; < 36 high  risk for falls (close to 100%) Functional gait assessment: 08/01/24 - 7/30; < 19 = high risk fall  Interpretation of scores: Non-Specific Older Adults Cutoff Score: <=22/30 = risk of falls Parkinson's Disease Cutoff score <15/30= fall risk (Hoehn & Yahr 1-4)  Minimally Clinically Important Difference (MCID)  Stroke (acute, subacute, and chronic) = MDC: 4.2 points Vestibular (acute) = MDC: 6 points Community Dwelling Older Adults =  MCID: 4 points Parkinson's Disease  =  MDC: 4.3 points  (Academy of Neurologic Physical Therapy (nd). Functional Gait  Assessment. Retrieved from https://www.neuropt.org/docs/default-source/cpgs/core-outcome-measures/function-gait-assessment-pocket-guide-proof9-(2).pdf?sfvrsn=b72f35043_0.)  GAIT: Distance walked: clinic distances Assistive device utilized: None Level of assistance: Complete Independence and SBA Gait pattern: decreased gait velocity, decreased stride length, lateral hip instability, and wide BOS   TODAY'S TREATMENT:   08/01/2024  PHYSICAL PERFORMANCE TEST or MEASUREMENT:  Berg Balance Test   Sit to Stand Able to stand  independently using hands    Standing Unsupported Able to stand 30 seconds unsupported   had to stop due to feeling dizzy   Sitting with Back Unsupported but Feet Supported on Floor or Stool Able to sit safely and securely 2 minutes    Stand to Sit Uses backs of legs against chair to control descent    Transfers Able to transfer safely, definite need of hands    Standing Unsupported with Eyes Closed Able to stand 10 seconds with supervision    Standing Unsupported with Feet Together Able to place feet together independently but unable to hold for 30 seconds    From Standing, Reach Forward with Outstretched Arm Can reach confidently >25 cm (10)    From Standing Position, Pick up Object from Floor Able to pick up shoe, needs supervision    From Standing Position, Turn to Look Behind Over each Shoulder Turn sideways only but maintains balance    Turn 360 Degrees Needs close supervision or verbal cueing    Standing Unsupported, Alternately Place Feet on Step/Stool Able to stand independently and complete 8 steps >20 seconds    Standing Unsupported, One Foot in Front Needs help to step but can hold 15 seconds    Standing on One Leg Able to lift leg independently and hold equal to or more than 3 seconds    Total Score 35    Berg comment: < 36 high risk for falls (close to 100%)      Functional Gait  Assessment   Gait Level Surface Walks 20 ft, slow speed, abnormal gait pattern,  evidence for imbalance or deviates 10-15 in outside of the 12 in walkway width. Requires more than 7 sec to ambulate 20 ft.    Change in Gait Speed Makes only minor adjustments to walking speed, or accomplishes a change in speed with significant gait deviations, deviates 10-15 in outside the 12 in walkway width, or changes speed but loses balance but is able to recover and continue walking.    Gait with Horizontal Head Turns Performs head turns with moderate changes in gait velocity, slows down, deviates 10-15 in outside 12 in walkway width but recovers, can continue to walk.    Gait with Vertical Head Turns Performs task with moderate change in gait velocity, slows down, deviates 10-15 in outside 12 in walkway width but recovers, can continue to walk.    Gait and Pivot Turn Turns slowly, requires verbal cueing, or requires several small steps to catch balance following turn and stop    Step Over Obstacle Is able to step over one shoe box (  4.5 in total height) but must slow down and adjust steps to clear box safely. May require verbal cueing.    Gait with Narrow Base of Support Ambulates less than 4 steps heel to toe or cannot perform without assistance.    Gait with Eyes Closed Cannot walk 20 ft without assistance, severe gait deviations or imbalance, deviates greater than 15 in outside 12 in walkway width or will not attempt task.    Ambulating Backwards Walks 20 ft, slow speed, abnormal gait pattern, evidence for imbalance, deviates 10-15 in outside 12 in walkway width.    Steps Cannot do safely.    Total Score 7    FGA comment: < 19 = high risk fall       THERAPEUTIC EXERCISE: To improve strength and endurance.  Demonstration, verbal and tactile cues throughout for technique.  Seated heel toe raises x 10 Seated hip ADD isometrics with ball 10 x 5 Seated LAQ x 10 Seated YTB hip flexion march x 10 Seated B hip ABD with looped YTB at distal thighs x 10 - better tolerated isolating 1 leg at a  time   07/30/2024  SELF CARE:  Reviewed eval findings and role of PT in addressing identified deficits as well as instruction in initial HEP (see below).    PATIENT EDUCATION:  Education details: standardized testing results and interpretation, HEP review, and continue with current HEP  Person educated: Patient Education method: Explanation, Demonstration, and Verbal cues Education comprehension: verbalized understanding, returned demonstration, verbal cues required, and needs further education  HOME EXERCISE PROGRAM: Access Code: [email or web address] URL: [email or web address] Date: 07/30/2024 Prepared by: Elijah Hidden  Exercises - Seated Heel Toe Raises  - 1 x daily - 5-7 x weekly - 1-2 sets - 8-10 reps - 3 sec hold - Seated March with Resistance  - 1 x daily - 5-7 x weekly - 1-2 sets - 8-10 reps - 3 sec hold - Seated Hip Abduction with Resistance  - 1 x daily - 5-7 x weekly - 1-2 sets - 8-10 reps - 3-5 sec hold - Seated Hip Adduction Isometrics with Ball  - 1 x daily - 5-7 x weekly - 1-2 sets - 8-10 reps - 3-5 sec hold - Seated Long Arc Quad  - 1 x daily - 5-7 x weekly - 1-2 sets - 8-10 reps - 3 sec hold   ASSESSMENT:  CLINICAL IMPRESSION: Completed standardized balance testing with Berg score of 35/56 indicating high risk for falls (close to 100%) and FGA score of 7/30 indicating high risk for falls.  Patient fatiguing significantly during standardized testing and review of HEP exercises performed following standardized testing with frequent rest breaks necessary.  Patient noting some increased pain/muscle soreness with hip ABD isometrics, therefore we will continue to monitor and adjust accordingly.  Ariana Herrera will benefit from continued skilled PT to address ongoing strength and balance deficits to improve mobility and activity tolerance with decreased pain interference and decreased risk for falls.   EVAL: Ariana Herrera is a 58 y.o. female who was referred to  physical therapy for evaluation and treatment for LE weakness and deconditioning 2 metastatic breast cancer.  She reports she experienced a recurrence of her cancer in December 2024 and ever since she has been losing ~10# per month and she feels like some of the weight loss has been related to muscle mass loss leading to generalized weakness and poor endurance with ADLs, daily mobility and household tasks.  Her energy levels are also impacted  by low blood counts and she also experiences chemo-induced peripheral neuropathy as well as intermittent nerve pain.  Patient has deficits in hip ROM, B LE flexibility, B LE strength, and impaired balance and activity tolerance which are interfering with ADLs and are impacting quality of life.  On LEFS patient scored 22/80 demonstrating moderate functional limitation.  Ariana Herrera will benefit from skilled PT to address above deficits to improve mobility and activity tolerance with decreased pain interference.  OBJECTIVE IMPAIRMENTS: Abnormal gait, cardiopulmonary status limiting activity, decreased activity tolerance, decreased endurance, decreased knowledge of use of DME, decreased mobility, difficulty walking, decreased strength, decreased safety awareness, dizziness, impaired flexibility, impaired sensation, postural dysfunction, and pain.   ACTIVITY LIMITATIONS: carrying, lifting, bending, sitting, standing, squatting, stairs, transfers, bed mobility, bathing, dressing, hygiene/grooming, and locomotion level  PARTICIPATION LIMITATIONS: meal prep, cleaning, laundry, driving, shopping, community activity, and yard work  PERSONAL FACTORS: Fitness, Past/current experiences, Time since onset of injury/illness/exacerbation, and 3+ comorbidities: Angina; anxiety; arthritis - knee, hips, back and neck (4 bulging discs in neck); asthma; bipolar 1; breast cancer 2019 and recurrent 2023 to right breast, right axillary, and left neck; R LE DVT; DM-II; HLD; MI;  pancreatitis; dizziness are also affecting patient's functional outcome.   REHAB POTENTIAL: Good  CLINICAL DECISION MAKING: Unstable/unpredictable  EVALUATION COMPLEXITY: High   GOALS: Goals reviewed with patient? Yes  SHORT TERM GOALS: Target date: 09/10/2024  Patient will be independent with initial HEP. Baseline: Initial HEP provided on eval Goal status: IN PROGRESS - 08/01/24 - reviewed initial HEP  2.  Complete standardized balance testing and update LTG's accordingly. Baseline: Berg & FGA pending Goal status: INITIAL  3.  Patient will improve 5x STS time to </= 24 seconds for improved efficiency and safety with transfers. Baseline: 30.87 sec Goal status: INITIAL  LONG TERM GOALS: Target date: 10/22/2024  Patient will be independent with advanced/ongoing HEP to improve outcomes and carryover.  Baseline:  Goal status: INITIAL  2.  Patient will demonstrate improved B LE strength to >/= 4 to 4+/5 for improved stability and ease of mobility. Baseline: Refer to above LE MMT table Goal status: INITIAL  3.  Patient will be able to ambulate 600' w/o AD and normal gait pattern without increased pain to access community.  Baseline: very limited ambulation tolerance  Goal status: INITIAL  4. Patient will report >/= 35/80 on LEFS (MCID = 9 pts) to demonstrate improved functional ability. Baseline: 22 / 80 = 27.5 % Goal status: INITIAL  5.  Patient will demonstrate at least 19/30 on FGA to decrease risk of falls. Baseline: 08/01/24 - 7/30; < 19 = high risk fall  Goal status: INITIAL   6.  Patient will improve Berg score by at least 8 points to improve safety and stability with ADLs in standing and reduce risk for falls Baseline: 08/01/24 - 35/56; < 36 high risk for falls (close to 100%) Goal status: INITIAL   PLAN:  PT FREQUENCY: 2x/week  PT DURATION: 12 weeks  PLANNED INTERVENTIONS: [id number]- PT Re-evaluation, 97750- Physical Performance Testing, 97110-Therapeutic  exercises, 97530- Therapeutic activity, 97112- Neuromuscular re-education, 97535- Self Care, [id number]- Manual therapy, [id number]- Gait training, [phone]- Aquatic Therapy, [phone]- Electrical stimulation (unattended), [id number]- Ultrasound, [id number]- Ionotophoresis 4mg /ml Dexamethasone , 79439 (1-2 muscles), [id number] (3+ muscles)- Dry Needling, Patient/Family education, Balance training, Stair training, Taping, Vestibular training, DME instructions, Cryotherapy, and Moist heat  PLAN FOR NEXT SESSION: Gently progress core/lumbopelvic and LE strengthening; balance activities as tolerated   Elijah CHRISTELLA Hidden, PT 08/01/2024, 10:56  AM

## 2024-08-05 ENCOUNTER — Other Ambulatory Visit: Payer: Self-pay | Admitting: Internal Medicine

## 2024-08-07 ENCOUNTER — Other Ambulatory Visit: Payer: Self-pay

## 2024-08-07 ENCOUNTER — Other Ambulatory Visit (HOSPITAL_BASED_OUTPATIENT_CLINIC_OR_DEPARTMENT_OTHER): Payer: Self-pay

## 2024-08-07 ENCOUNTER — Other Ambulatory Visit: Payer: Self-pay | Admitting: Hematology & Oncology

## 2024-08-07 LAB — HM DIABETES EYE EXAM

## 2024-08-07 MED ORDER — HYDROCODONE-ACETAMINOPHEN 7.5-325 MG PO TABS
1.0000 | ORAL_TABLET | Freq: Four times a day (QID) | ORAL | 0 refills | Status: DC | PRN
  Filled 2024-08-07: qty 120, 30d supply, fill #0

## 2024-08-07 NOTE — Progress Notes (Signed)
 Cardiology Office Note   Date:  08/21/2024  ID:  Ariana Herrera, DOB 05-21-1966, MRN [id number] PCP: Almarie Waddell NOVAK, NP  [facility] Providers Cardiologist:  Vinie JAYSON Maxcy, MD Cardiology APP:  Vicci Rollo SAUNDERS, PA-C   History of Present Illness Ariana Herrera is a 58 y.o. female with a past medical history of CAD, type 2 DM, HLD, asthma, breast cancer. She is followed by Dr. Maxcy and presents today for a follow up appointment    Per chart review patient previously underwent echocardiogram on 08/21/20 that showed EF 60-65%, no regional wall motion abnormalities, normal RV function. Underwent LHC on 08/22/20 that showed mild nonobstructive CAD. She was managed medically with lipitor, metoprolol , losartan , fenofibrate .   Patient was last seen by Dr. Maxcy on 07/30/22. At that time, patient was doing well from a cardiac perspective. No changes were made to her medications. I saw her in clinic on 09/20/23. At that time, she reported occasional episodes of chest pain once every few months, often during times of significant emotional stress. Discomfort was very brief and resolved on its own. As symptoms were very infrequent, were mild, and resolved on their own, we decided not to pursue ischemic evaluation. She remained on pravastatin , fenofibrate . Started metoprolol  tartrate for occasional tachycardia.    She is followed by oncology for breast cancer. Developed thrombocytopenia with platelets as low as 16 in 02/2024. Her xarelto  and talazoparib were held. 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 At that time, patient reported having frequent episodes of dizziness upon standing. No syncope. Symptoms improved if she stood up too quickly. Denied DOE, lower extremity swelling, palpitations. In clinic, her orthostatic vital signs were positive. Transitioned from metoprolol  tartrate  to metoprolol  succinate 12.5 mg daily at bedtime. She was instructed to wear compression stockings, increase fluid and protein intake. In 05/01/24 she continued to have some dizziness upon standing. Metoprolol  was stopped. Follow up cardiac monitor from 04/2024 showed sinus rhythm with heart rate between 77 and 122 bpm.  Average heart rate 93 bpm.  Today, patient presents for a follow-up appointment.  Reports that she is overall been doing well from a heart perspective.  She denies chest pain, shortness of breath.  She denies syncope, near syncope, palpitations.  She continues to have mild dizziness if she stands up too quickly.  She has been working with physical therapy to increase her strength.  Notes that as she works with physical therapy, her dizziness has significantly been improving.  She previously had to use a wheelchair for her dizziness.  Then was able to use a walker.  Now is able to walk around without assistive devices.  She continues to be followed closely by oncology and is on chemotherapy.  Has been in remission from breast cancer for about 1 year.  Heme has been keeping an eye on her anemia and her platelet counts.  She remains on Xarelto .  She continues to smoke about 1 pack/day.  We discussed tobacco cessation.  She has been having a difficult time quitting, reports that she has tried everything including medications, patches, vaping without success.   Studies Reviewed  Cardiac Studies & Procedures   ______________________________________________________________________________________________        Ariana  Herrera TERM MONITOR (3-14 DAYS) 05/14/2024  Narrative Patch Wear Time:  2 days and 21 hours (2025-07-12T09:40:32-0400 to 2025-07-15T07:34:27-0400)  Patient had  a min HR of 77 bpm, max HR of 122 bpm, and avg HR of 93 bpm. Predominant underlying rhythm was Sinus Rhythm. Isolated SVEs were rare (<1.0%), and no SVE Couplets or SVE Triplets were present. Isolated VEs were rare  (<1.0%), and no VE Couplets or VE Triplets were present. Ventricular Trigeminy was present.  Monitor showed sinus rhythm with HR between 77 and 122. Patient triggered PVC's noted, however, PVC's and PAC's were rare. No afib or SVT noted.  Vinie KYM Maxcy, MD, [facility], FNLA, FACP [facility] Medical Director of the [facility] Diplomate of the American Board of Clinical Lipidology Attending Cardiologist Direct Dial: [phone]  Fax: [phone] Website:  [email or web address]       ______________________________________________________________________________________________       Risk Assessment/Calculations           Physical Exam VS:  BP 118/60   Pulse (!) 110   Ht 5' 2 (1.575 m)   Wt 143 lb 6.4 oz (65 kg)   LMP 10/04/2016 Comment: after procedure to stop bleeding  SpO2 98%   BMI 26.23 kg/m        Wt Readings from Last 3 Encounters:  08/21/24 143 lb 6.4 oz (65 kg)  08/16/24 144 lb (65.3 kg)  07/20/24 147 lb 14.4 oz (67.1 kg)    GEN: Well nourished, well developed in no acute distress. Sitting comfortably on the exam table  NECK: No JVD  CARDIAC:  Regular rhythm, tachycardic. no murmurs, rubs, gallops. Radial pulses 2+ bilaterally  RESPIRATORY:  End expiratory wheezes in lower lung fields. Normal WOB on room air   ABDOMEN: Soft, non-tender, non-distended EXTREMITIES:  No edema in BLE; No deformity   ASSESSMENT AND PLAN  Orthostatic hypotension  Sinus Tachycardia  - When seen in 08/2023, EKG showed sinus tachycardia with HR 115 BPM. She reported that her HR and BP had both been a bit elevated at her past few appointments. Started metoprolol  tartrate 25 mg BID. In 02/2024, she started having episodes of dizziness upon standing and low BP. Orthostatic vital signs in the office positive. Ultimately, metoprolol  was discontinued  - 3 day Zio after discontinuing metoprolol  showed sinus rhythm with  heart rate ranging from 77-122 bpm, average heart rate 93 bpm -  Patient denies palpitations.  - BP today 118/60  - Her dizziness has significantly improved.  She is working with physical therapy to increase her strength overall.  Notes that this has been helping her dizziness.  No syncope or near syncope.  - Instructed patient to start wearing compression stockings and increase hydration.  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  Hemoglobin as low as 6.8 on 7/1 - Most recent labs from 06/2024 showed hemoglobin 8.5, platelets 96  - Followed by heme  - Likely her anemia is contributing to her dizziness and sinus tachycardia   Nonobstructive CAD  - Patient underwent cardiac catheterization in 07/2020 that showed mild nonobstructive CAD  - Patient denies chest pain or shortness of breath  - Continue statin therapy for risk reduction  - No ASA as patient is on xarelto     HLD  - Lipid panel from 03/2024 showed LDL 10, HDL 31, triglycerides 192, total cholesterol 71  - Continue crestor  40 mg daily  - Continue fenofibrate   145 mg daily  Tobacco Use  - Patient continues to smoke 1 pack/day.  Tells me that she has tried to quit many times in the past.  Has not had success with medications, nicotine  patches, vaping -Discussed tobacco cessation    Dispo: Follow up in 6 months with Dr. Mona   Signed, Rollo FABIENE Louder, PA-C

## 2024-08-08 ENCOUNTER — Ambulatory Visit

## 2024-08-08 DIAGNOSIS — R2681 Unsteadiness on feet: Secondary | ICD-10-CM

## 2024-08-08 DIAGNOSIS — R262 Difficulty in walking, not elsewhere classified: Secondary | ICD-10-CM

## 2024-08-08 DIAGNOSIS — M6281 Muscle weakness (generalized): Secondary | ICD-10-CM

## 2024-08-08 NOTE — Therapy (Signed)
 OUTPATIENT PHYSICAL THERAPY TREATMENT   Patient Name: BRITTANYA WINBURN MRN: [id number] DOB: 10-28-1965, 58 y.o., female Today's Date: 08/08/2024   END OF SESSION:  PT End of Session - 08/08/24 1402     Visit Number 3    Date for Recertification  10/22/24    Authorization Type [facility] Medicaid    Authorization Time Period Auth starting 08/25/24    PT Start Time 1400    PT Stop Time 1439    PT Time Calculation (min) 39 min    Activity Tolerance Patient tolerated treatment well;Patient limited by fatigue;Patient limited by pain    Behavior During Therapy [facility] for tasks assessed/performed            Past Medical History:  Diagnosis Date   Anginal pain    Anxiety    Arthritis    Asthma    Bipolar 1 disorder (HCC)    Bipolar disease, chronic (HCC)    Breast cancer in female (HCC)    2019 and recurrent in 2023 to right breast, right axillary, and left neck   Coronary artery disease    Deep vein blood clot of right lower extremity (HCC)    Diabetes mellitus type 2 in obese    Goals of care, counseling/discussion 11/23/2021   History of external beam radiation therapy    Left neck-10/07/23-11/24/23-[facility]   Hyperlipidemia    Lymphedema    MI (myocardial infarction) (HCC)    was in [place]    Obesity    Pancreatitis    Personal history of chemotherapy    Personal history of radiation therapy    Sleep apnea    Past Surgical History:  Procedure Laterality Date   BREAST BIOPSY Left 10/03/2023   times 2   BREAST LUMPECTOMY Left 03/2019   CATARACT EXTRACTION Bilateral    CHOLECYSTECTOMY     IR IMAGING GUIDED PORT INSERTION  12/15/2021   IR PATIENT EVAL TECH 0-60 MINS  10/06/2023   IR REMOVAL TUN ACCESS W/ PORT W/O FL MOD SED  12/20/2019   IR US  GUIDE BX ASP/DRAIN  11/02/2021   LEFT HEART CATH AND CORONARY ANGIOGRAPHY N/A 08/22/2020   Procedure: LEFT HEART CATH AND CORONARY ANGIOGRAPHY;  Surgeon: Verlin Lonni BIRCH, MD;  Location: [facility];   Service: Cardiovascular;  Laterality: N/A;   TRIGGER FINGER RELEASE Bilateral    x 5   TUBAL LIGATION     UMBILICAL HERNIA REPAIR     x 2   uterine ablation     Patient Active Problem List   Diagnosis Date Noted   Internal jugular (IJ) vein thromboembolism, acute (HCC) 10/06/2023   Facial swelling 10/05/2023   Tobacco use 07/14/2023   Type 2 diabetes mellitus with hyperglycemia, with long-term current use of insulin  (HCC) 06/23/2023   Stress incontinence of urine 06/23/2023   Moderate persistent asthma without complication 06/23/2023   Obesity (BMI 30-39.9) 06/23/2023   Encounter for orthopedic follow-up care 09/23/2021   Acquired trigger finger of left middle finger 08/27/2021   Chest pain of uncertain etiology    History of MI (myocardial infarction) 08/20/2020   History of pancreatitis 08/20/2020   Hyperlipidemia associated with type 2 diabetes mellitus (HCC) 08/20/2020   Bipolar disease, chronic (HCC)    Breast cancer (HCC) 11/26/2019   Chemotherapy-induced neuropathy 11/26/2019   History of DVT (deep vein thrombosis) 11/26/2019    PCP: Cyndi Shaver, PA-C (Inactive)   REFERRING PROVIDER: Timmy Maude SAUNDERS, MD   REFERRING DIAG: [id number] (ICD-10-CM) - Malignant  neoplasm involving both nipple and areola of left breast in female, unspecified estrogen receptor status (HCC)   Per MD order: She is a bit deconditioned. She has lost weight. She does have breast cancer. I think she just needs some strengthening exercises for her legs.   THERAPY DIAG:  Muscle weakness (generalized)  Difficulty in walking, not elsewhere classified  Unsteadiness on feet  RATIONALE FOR EVALUATION AND TREATMENT: Rehabilitation  ONSET DATE: ~Dec 2024  NEXT MD VISIT: 08/24/24   SUBJECTIVE:                                                                                                                                                                                                          SUBJECTIVE STATEMENT: Pt denies leg pain today, notes fatigue from chemo therapy.  EVAL: Pt reports her cancer returned in Dec 2024 and ever since she has been losing ~10# per month and she feels like her muscles have gone with the weight.  Now has difficulty with housework and working the yard or garden.  Unable to go shopping w/o using an electric cart. Also reports nerve pain at times.  She is in remission but does have terminal metastatic breast cancer, currently taking oral chemo - 3 weeks on/1 week off.  She states her blood counts are currently low.  PAIN: Are you having pain? Yes: NPRS scale: 0/10  Pain location: B LE  Pain description: ache  Aggravating factors: walking  Relieving factors: laying down, sitting   PERTINENT HISTORY:  Angina; anxiety; arthritis - knee, hips, back and neck (4 bulging discs in neck); asthma; bipolar 1; breast cancer 2019 and recurrent 2023 to right breast, right axillary, and left neck; R LE DVT; DM-II; HLD; MI; pancreatitis; dizziness  PRECAUTIONS: None  RED FLAGS: None  WEIGHT BEARING RESTRICTIONS: No  FALLS:  Has patient fallen in last 6 months? No  LIVING ENVIRONMENT: Lives with: lives with their spouse and lives with their son Lives in: Mobile home - 5th wheel Stairs: Yes: External: 3 steps; on left going up Has following equipment at home: Single point cane, Environmental consultant - 2 wheeled, Wheelchair (manual), and shower chair  OCCUPATION: On disability  PLOF: Independent with basic ADLs, Needs assistance with homemaking, and Leisure: gardening, travel - unable to participate in leisure activities right now  PATIENT GOALS: Be able to stand longer periods of time - get strength back in my legs. Walk around easier.   OBJECTIVE: (objective measures completed at initial evaluation unless otherwise dated)  DIAGNOSTIC FINDINGS:  06/11/24 - CT ANGIOGRAPHY CHEST  WITH CONTRAST IMPRESSION: 1. Tiny subsegmental pulmonary embolism in a posterior  left lower lobe pulmonary artery. No evidence of right heart strain. 2. Small pericardial effusion. 3. Postsurgical changes in the left breast with diffuse dermal and trabecular thickening similar to prior. Findings in the neck reported separately. 4. Unchanged vague nodularity in the left supraclavicular region. 5. Aortic Atherosclerosis (ICD10-I70.0).   06/03/24 - MR NECK WITH AND WITHOUT INTRAVENOUS CONTRAST IMPRESSION: 1. No acute findings within the soft tissues of the neck. 2. Mild to moderate degenerative changes in the cervical spine as described above. No abnormal enhancement of the spinal cord or nerve roots. 3. Interval resolution of left supraclavicular lymphadenopathy and soft tissue stranding.  03/02/24 - MRI HEAD WITHOUT AND WITH CONTRAST IMPRESSION: No acute intracranial abnormality. No findings to suggest intracranial metastatic disease.   Punctate susceptibility in the right parietal white matter slightly more conspicuous on current study likely due to differences in technique. No associated edema or enhancement. Findings likely reflect chronic microhemorrhages. Recommend attention on follow-up.   Similar 7 mm dural-based enhancing lesion over the left middle frontal gyrus suggestive of a small meningioma.   Trace mastoid effusions.  PATIENT SURVEYS:  LEFS  Extreme difficulty/unable (0), Quite a bit of difficulty (1), Moderate difficulty (2), Little difficulty (3), No difficulty (4) Survey date:  07/30/24  Any of your usual work, housework or school activities 1  2. Usual hobbies, recreational or sporting activities 0  3. Getting into/out of the bath 1  4. Walking between rooms 2  5. Putting on socks/shoes 4  6. Squatting  0  7. Lifting an object, like a bag of groceries from the floor 2  8. Performing light activities around your home 1  9. Performing heavy activities around your home 0  10. Getting into/out of a car 4  11. Walking 2 blocks 0  12. Walking  1 mile 0  13. Going up/down 10 stairs (1 flight) 1  14. Standing for 1 hour 3  15.  sitting for 1 hour 0  16. Running on even ground 0  17. Running on uneven ground 0  18. Making sharp turns while running fast 0  19. Hopping  1  20. Rolling over in bed 2  Score total:  22 / 80 = 27.5 %  Functional limitation:   moderate     COGNITION: Overall cognitive status: Impaired (Chemo brain)  SENSATION: Chemo-based neuropathy in B hands and feet; nerve pain in legs  EDEMA:  Fluctuating edema in feet  POSTURE:  No Significant postural limitations  MUSCLE LENGTH: Hamstrings: mild tight B Piriformis: mod/severe tight R, mild tight L  LOWER EXTREMITY ROM: Grossly WFL other than limitation in hip rotation R>L  LOWER EXTREMITY MMT:  MMT Right eval Left eval  Hip flexion 4- 4-  Hip extension 3+ 4-  Hip abduction 3+ 3+  Hip adduction 3- 3-  Hip internal rotation 4 4  Hip external rotation 4- 4-  Knee flexion 4- 4-  Knee extension 4 4  Ankle dorsiflexion 4 4-  Ankle plantarflexion 3 (4 SLS HR) 3 (4 SLS HR)  Ankle inversion    Ankle eversion     (Blank rows = not tested)  FUNCTIONAL TESTS:  5 times sit to stand: 30.87 sec (several tries/attempts necessary on some reps) Timed up and go (TUG): 13.84 sec 10 meter walk test: 11.32 sec  Gait speed: 2.90 ft/sec Berg Balance Scale: 08/01/24 - 35/56; < 36 high risk for falls (close to  100%) Functional gait assessment: 08/01/24 - 7/30; < 19 = high risk fall  Interpretation of scores: Non-Specific Older Adults Cutoff Score: <=22/30 = risk of falls Parkinson's Disease Cutoff score <15/30= fall risk (Hoehn & Yahr 1-4)  Minimally Clinically Important Difference (MCID)  Stroke (acute, subacute, and chronic) = MDC: 4.2 points Vestibular (acute) = MDC: 6 points Community Dwelling Older Adults =  MCID: 4 points Parkinson's Disease  =  MDC: 4.3 points  (Academy of Neurologic Physical Therapy (nd). Functional Gait Assessment. Retrieved  from https://www.neuropt.org/docs/default-source/cpgs/core-outcome-measures/function-gait-assessment-pocket-guide-proof9-(2).pdf?sfvrsn=b60f35043_0.)  GAIT: Distance walked: clinic distances Assistive device utilized: None Level of assistance: Complete Independence and SBA Gait pattern: decreased gait velocity, decreased stride length, lateral hip instability, and wide BOS   TODAY'S TREATMENT:  08/08/24 NEUROMUSCULAR RE-EDUCATION: To improve coordination, kinesthesia, posture, and proprioception.  Standing heel/toe raises x 10 B- needed to sit after each exercise reporting dizziness for ~2-3 seconds Seated ab sets with beach ball 2x10 Seated B Shoulder ER and hip ER 2x10 Tandem stance x 30 BLE Clock balance R/L 12 to 6 o'clock 1x around Supine PPT 2x10 Supine marches with PPT x 10 B Bridges x 10  THERAPEUTIC EXERCISE: To improve strength, endurance, ROM, and flexibility Nustep L2x64min Brief HEP review LTR both ways x 10 Supine SLR x 10 BLE Supine SKTC stretch x 30 R/L Supine figure 4 stretch x 30 R/L  08/01/2024  PHYSICAL PERFORMANCE TEST or MEASUREMENT:  Berg Balance Test   Sit to Stand Able to stand  independently using hands    Standing Unsupported Able to stand 30 seconds unsupported   had to stop due to feeling dizzy   Sitting with Back Unsupported but Feet Supported on Floor or Stool Able to sit safely and securely 2 minutes    Stand to Sit Uses backs of legs against chair to control descent    Transfers Able to transfer safely, definite need of hands    Standing Unsupported with Eyes Closed Able to stand 10 seconds with supervision    Standing Unsupported with Feet Together Able to place feet together independently but unable to hold for 30 seconds    From Standing, Reach Forward with Outstretched Arm Can reach confidently >25 cm (10)    From Standing Position, Pick up Object from Floor Able to pick up shoe, needs supervision    From Standing Position, Turn to Look  Behind Over each Shoulder Turn sideways only but maintains balance    Turn 360 Degrees Needs close supervision or verbal cueing    Standing Unsupported, Alternately Place Feet on Step/Stool Able to stand independently and complete 8 steps >20 seconds    Standing Unsupported, One Foot in Front Needs help to step but can hold 15 seconds    Standing on One Leg Able to lift leg independently and hold equal to or more than 3 seconds    Total Score 35    Berg comment: < 36 high risk for falls (close to 100%)      Functional Gait  Assessment   Gait Level Surface Walks 20 ft, slow speed, abnormal gait pattern, evidence for imbalance or deviates 10-15 in outside of the 12 in walkway width. Requires more than 7 sec to ambulate 20 ft.    Change in Gait Speed Makes only minor adjustments to walking speed, or accomplishes a change in speed with significant gait deviations, deviates 10-15 in outside the 12 in walkway width, or changes speed but loses balance but is able to recover and continue walking.  Gait with Horizontal Head Turns Performs head turns with moderate changes in gait velocity, slows down, deviates 10-15 in outside 12 in walkway width but recovers, can continue to walk.    Gait with Vertical Head Turns Performs task with moderate change in gait velocity, slows down, deviates 10-15 in outside 12 in walkway width but recovers, can continue to walk.    Gait and Pivot Turn Turns slowly, requires verbal cueing, or requires several small steps to catch balance following turn and stop    Step Over Obstacle Is able to step over one shoe box (4.5 in total height) but must slow down and adjust steps to clear box safely. May require verbal cueing.    Gait with Narrow Base of Support Ambulates less than 4 steps heel to toe or cannot perform without assistance.    Gait with Eyes Closed Cannot walk 20 ft without assistance, severe gait deviations or imbalance, deviates greater than 15 in outside 12 in walkway  width or will not attempt task.    Ambulating Backwards Walks 20 ft, slow speed, abnormal gait pattern, evidence for imbalance, deviates 10-15 in outside 12 in walkway width.    Steps Cannot do safely.    Total Score 7    FGA comment: < 19 = high risk fall       THERAPEUTIC EXERCISE: To improve strength and endurance.  Demonstration, verbal and tactile cues throughout for technique.  Seated heel toe raises x 10 Seated hip ADD isometrics with ball 10 x 5 Seated LAQ x 10 Seated YTB hip flexion march x 10 Seated B hip ABD with looped YTB at distal thighs x 10 - better tolerated isolating 1 leg at a time   07/30/2024  SELF CARE:  Reviewed eval findings and role of PT in addressing identified deficits as well as instruction in initial HEP (see below).    PATIENT EDUCATION:  Education details: standardized testing results and interpretation, HEP review, and continue with current HEP  Person educated: Patient Education method: Explanation, Demonstration, and Verbal cues Education comprehension: verbalized understanding, returned demonstration, verbal cues required, and needs further education  HOME EXERCISE PROGRAM: Access Code: [email or web address] URL: [email or web address] Date: 07/30/2024 Prepared by: Elijah Hidden  Exercises - Seated Heel Toe Raises  - 1 x daily - 5-7 x weekly - 1-2 sets - 8-10 reps - 3 sec hold - Seated March with Resistance  - 1 x daily - 5-7 x weekly - 1-2 sets - 8-10 reps - 3 sec hold - Seated Hip Abduction with Resistance  - 1 x daily - 5-7 x weekly - 1-2 sets - 8-10 reps - 3-5 sec hold - Seated Hip Adduction Isometrics with Ball  - 1 x daily - 5-7 x weekly - 1-2 sets - 8-10 reps - 3-5 sec hold - Seated Long Arc Quad  - 1 x daily - 5-7 x weekly - 1-2 sets - 8-10 reps - 3 sec hold   ASSESSMENT:  CLINICAL IMPRESSION: Initiated treatment for LE strength and balance. Pt shows decreased activity tolerance during session in standing requiring many seated rest  breaks. Fatigued more quickly with compound strengthening and balance activities. Myka Hitz will benefit from continued skilled PT to address ongoing strength and balance deficits to improve mobility and activity tolerance with decreased pain interference and decreased risk for falls.   EVAL: KAYLIE RITTER is a 58 y.o. female who was referred to physical therapy for evaluation and treatment for LE weakness and deconditioning 2 metastatic breast  cancer.  She reports she experienced a recurrence of her cancer in December 2024 and ever since she has been losing ~10# per month and she feels like some of the weight loss has been related to muscle mass loss leading to generalized weakness and poor endurance with ADLs, daily mobility and household tasks.  Her energy levels are also impacted by low blood counts and she also experiences chemo-induced peripheral neuropathy as well as intermittent nerve pain.  Patient has deficits in hip ROM, B LE flexibility, B LE strength, and impaired balance and activity tolerance which are interfering with ADLs and are impacting quality of life.  On LEFS patient scored 22/80 demonstrating moderate functional limitation.  Uyen Eichholz will benefit from skilled PT to address above deficits to improve mobility and activity tolerance with decreased pain interference.  OBJECTIVE IMPAIRMENTS: Abnormal gait, cardiopulmonary status limiting activity, decreased activity tolerance, decreased endurance, decreased knowledge of use of DME, decreased mobility, difficulty walking, decreased strength, decreased safety awareness, dizziness, impaired flexibility, impaired sensation, postural dysfunction, and pain.   ACTIVITY LIMITATIONS: carrying, lifting, bending, sitting, standing, squatting, stairs, transfers, bed mobility, bathing, dressing, hygiene/grooming, and locomotion level  PARTICIPATION LIMITATIONS: meal prep, cleaning, laundry, driving, shopping, community activity, and  yard work  PERSONAL FACTORS: Fitness, Past/current experiences, Time since onset of injury/illness/exacerbation, and 3+ comorbidities: Angina; anxiety; arthritis - knee, hips, back and neck (4 bulging discs in neck); asthma; bipolar 1; breast cancer 2019 and recurrent 2023 to right breast, right axillary, and left neck; R LE DVT; DM-II; HLD; MI; pancreatitis; dizziness are also affecting patient's functional outcome.   REHAB POTENTIAL: Good  CLINICAL DECISION MAKING: Unstable/unpredictable  EVALUATION COMPLEXITY: High   GOALS: Goals reviewed with patient? Yes  SHORT TERM GOALS: Target date: 09/10/2024  Patient will be independent with initial HEP. Baseline: Initial HEP provided on eval Goal status: IN PROGRESS - 08/01/24 - reviewed initial HEP  2.  Complete standardized balance testing and update LTG's accordingly. Baseline: Berg & FGA pending Goal status: INITIAL  3.  Patient will improve 5x STS time to </= 24 seconds for improved efficiency and safety with transfers. Baseline: 30.87 sec Goal status: INITIAL  LONG TERM GOALS: Target date: 10/22/2024  Patient will be independent with advanced/ongoing HEP to improve outcomes and carryover.  Baseline:  Goal status: INITIAL  2.  Patient will demonstrate improved B LE strength to >/= 4 to 4+/5 for improved stability and ease of mobility. Baseline: Refer to above LE MMT table Goal status: INITIAL  3.  Patient will be able to ambulate 600' w/o AD and normal gait pattern without increased pain to access community.  Baseline: very limited ambulation tolerance  Goal status: INITIAL  4. Patient will report >/= 35/80 on LEFS (MCID = 9 pts) to demonstrate improved functional ability. Baseline: 22 / 80 = 27.5 % Goal status: INITIAL  5.  Patient will demonstrate at least 19/30 on FGA to decrease risk of falls. Baseline: 08/01/24 - 7/30; < 19 = high risk fall  Goal status: INITIAL   6.  Patient will improve Berg score by at least 8  points to improve safety and stability with ADLs in standing and reduce risk for falls Baseline: 08/01/24 - 35/56; < 36 high risk for falls (close to 100%) Goal status: INITIAL   PLAN:  PT FREQUENCY: 2x/week  PT DURATION: 12 weeks  PLANNED INTERVENTIONS: [id number]- PT Re-evaluation, 97750- Physical Performance Testing, 97110-Therapeutic exercises, 97530- Therapeutic activity, [id number]- Neuromuscular re-education, 97535- Self Care, 02859- Manual therapy, 02883-  Gait training, 02886- Aquatic Therapy, [phone]- Electrical stimulation (unattended), [id number]- Ultrasound, [id number]- Ionotophoresis 4mg /ml Dexamethasone , 20560 (1-2 muscles), 20561 (3+ muscles)- Dry Needling, Patient/Family education, Balance training, Stair training, Taping, Vestibular training, DME instructions, Cryotherapy, and Moist heat  PLAN FOR NEXT SESSION: Gently progress core/lumbopelvic and LE strengthening; balance activities as tolerated   Rayan Ines L Maddelynn Moosman, PTA 08/08/2024, 2:39 PM

## 2024-08-10 ENCOUNTER — Ambulatory Visit

## 2024-08-10 DIAGNOSIS — M6281 Muscle weakness (generalized): Secondary | ICD-10-CM

## 2024-08-10 DIAGNOSIS — R262 Difficulty in walking, not elsewhere classified: Secondary | ICD-10-CM

## 2024-08-10 DIAGNOSIS — R2681 Unsteadiness on feet: Secondary | ICD-10-CM

## 2024-08-10 NOTE — Therapy (Signed)
 OUTPATIENT PHYSICAL THERAPY TREATMENT   Patient Name: CHESTINA KOMATSU MRN: [id number] DOB: 08/23/1966, 58 y.o., female Today's Date: 08/10/2024   END OF SESSION:  PT End of Session - 08/10/24 1014     Visit Number 4    Date for Recertification  10/22/24    Authorization Type [employer]    Authorization Time Period Auth starting 08/25/24    PT Start Time 0933    PT Stop Time 1013    PT Time Calculation (min) 40 min    Activity Tolerance Patient tolerated treatment well;Patient limited by fatigue;Patient limited by pain    Behavior During Therapy [facility] for tasks assessed/performed             Past Medical History:  Diagnosis Date   Anginal pain    Anxiety    Arthritis    Asthma    Bipolar 1 disorder (HCC)    Bipolar disease, chronic (HCC)    Breast cancer in female (HCC)    2019 and recurrent in 2023 to right breast, right axillary, and left neck   Coronary artery disease    Deep vein blood clot of right lower extremity (HCC)    Diabetes mellitus type 2 in obese    Goals of care, counseling/discussion 11/23/2021   History of external beam radiation therapy    Left neck-10/07/23-11/24/23-[facility]ard   Hyperlipidemia    Lymphedema    MI (myocardial infarction) (HCC)    was in [place]    Obesity    Pancreatitis    Personal history of chemotherapy    Personal history of radiation therapy    Sleep apnea    Past Surgical History:  Procedure Laterality Date   BREAST BIOPSY Left 10/03/2023   times 2   BREAST LUMPECTOMY Left 03/2019   CATARACT EXTRACTION Bilateral    CHOLECYSTECTOMY     IR IMAGING GUIDED PORT INSERTION  12/15/2021   IR PATIENT EVAL TECH 0-60 MINS  10/06/2023   IR REMOVAL TUN ACCESS W/ PORT W/O FL MOD SED  12/20/2019   IR US  GUIDE BX ASP/DRAIN  11/02/2021   LEFT HEART CATH AND CORONARY ANGIOGRAPHY N/A 08/22/2020   Procedure: LEFT HEART CATH AND CORONARY ANGIOGRAPHY;  Surgeon: Verlin Lonni BIRCH, MD;  Location: [facility];   Service: Cardiovascular;  Laterality: N/A;   TRIGGER FINGER RELEASE Bilateral    x 5   TUBAL LIGATION     UMBILICAL HERNIA REPAIR     x 2   uterine ablation     Patient Active Problem List   Diagnosis Date Noted   Internal jugular (IJ) vein thromboembolism, acute (HCC) 10/06/2023   Facial swelling 10/05/2023   Tobacco use 07/14/2023   Type 2 diabetes mellitus with hyperglycemia, with long-term current use of insulin  (HCC) 06/23/2023   Stress incontinence of urine 06/23/2023   Moderate persistent asthma without complication 06/23/2023   Obesity (BMI 30-39.9) 06/23/2023   Encounter for orthopedic follow-up care 09/23/2021   Acquired trigger finger of left middle finger 08/27/2021   Chest pain of uncertain etiology    History of MI (myocardial infarction) 08/20/2020   History of pancreatitis 08/20/2020   Hyperlipidemia associated with type 2 diabetes mellitus (HCC) 08/20/2020   Bipolar disease, chronic (HCC)    Breast cancer (HCC) 11/26/2019   Chemotherapy-induced neuropathy 11/26/2019   History of DVT (deep vein thrombosis) 11/26/2019    PCP: Cyndi Shaver, PA-C (Inactive)   REFERRING PROVIDER: Timmy Maude SAUNDERS, MD   REFERRING DIAG: [phone] (ICD-10-CM) -  Malignant neoplasm involving both nipple and areola of left breast in female, unspecified estrogen receptor status (HCC)   Per MD order: She is a bit deconditioned. She has lost weight. She does have breast cancer. I think she just needs some strengthening exercises for her legs.   THERAPY DIAG:  Muscle weakness (generalized)  Difficulty in walking, not elsewhere classified  Unsteadiness on feet  RATIONALE FOR EVALUATION AND TREATMENT: Rehabilitation  ONSET DATE: ~Dec 2024  NEXT MD VISIT: 08/24/24   SUBJECTIVE:                                                                                                                                                                                                          SUBJECTIVE STATEMENT: No new reports, was not sore after last visit, she took a break from HEP yesterday, does have leg pain today  EVAL: Pt reports her cancer returned in Dec 2024 and ever since she has been losing ~10# per month and she feels like her muscles have gone with the weight.  Now has difficulty with housework and working the yard or garden.  Unable to go shopping w/o using an electric cart. Also reports nerve pain at times.  She is in remission but does have terminal metastatic breast cancer, currently taking oral chemo - 3 weeks on/1 week off.  She states her blood counts are currently low.  PAIN: Are you having pain? Yes: NPRS scale: 5/10  Pain location: B LE  Pain description: ache  Aggravating factors: walking  Relieving factors: laying down, sitting   PERTINENT HISTORY:  Angina; anxiety; arthritis - knee, hips, back and neck (4 bulging discs in neck); asthma; bipolar 1; breast cancer 2019 and recurrent 2023 to right breast, right axillary, and left neck; R LE DVT; DM-II; HLD; MI; pancreatitis; dizziness  PRECAUTIONS: None  RED FLAGS: None  WEIGHT BEARING RESTRICTIONS: No  FALLS:  Has patient fallen in last 6 months? No  LIVING ENVIRONMENT: Lives with: lives with their spouse and lives with their son Lives in: Mobile home - 5th wheel Stairs: Yes: External: 3 steps; on left going up Has following equipment at home: Single point cane, Environmental consultant - 2 wheeled, Wheelchair (manual), and shower chair  OCCUPATION: On disability  PLOF: Independent with basic ADLs, Needs assistance with homemaking, and Leisure: gardening, travel - unable to participate in leisure activities right now  PATIENT GOALS: Be able to stand longer periods of time - get strength back in my legs. Walk around easier.   OBJECTIVE: (objective measures completed at initial evaluation  unless otherwise dated)  DIAGNOSTIC FINDINGS:  06/11/24 - CT ANGIOGRAPHY CHEST WITH CONTRAST IMPRESSION: 1. Tiny  subsegmental pulmonary embolism in a posterior left lower lobe pulmonary artery. No evidence of right heart strain. 2. Small pericardial effusion. 3. Postsurgical changes in the left breast with diffuse dermal and trabecular thickening similar to prior. Findings in the neck reported separately. 4. Unchanged vague nodularity in the left supraclavicular region. 5. Aortic Atherosclerosis (ICD10-I70.0).   06/03/24 - MR NECK WITH AND WITHOUT INTRAVENOUS CONTRAST IMPRESSION: 1. No acute findings within the soft tissues of the neck. 2. Mild to moderate degenerative changes in the cervical spine as described above. No abnormal enhancement of the spinal cord or nerve roots. 3. Interval resolution of left supraclavicular lymphadenopathy and soft tissue stranding.  03/02/24 - MRI HEAD WITHOUT AND WITH CONTRAST IMPRESSION: No acute intracranial abnormality. No findings to suggest intracranial metastatic disease.   Punctate susceptibility in the right parietal white matter slightly more conspicuous on current study likely due to differences in technique. No associated edema or enhancement. Findings likely reflect chronic microhemorrhages. Recommend attention on follow-up.   Similar 7 mm dural-based enhancing lesion over the left middle frontal gyrus suggestive of a small meningioma.   Trace mastoid effusions.  PATIENT SURVEYS:  LEFS  Extreme difficulty/unable (0), Quite a bit of difficulty (1), Moderate difficulty (2), Little difficulty (3), No difficulty (4) Survey date:  07/30/24  Any of your usual work, housework or school activities 1  2. Usual hobbies, recreational or sporting activities 0  3. Getting into/out of the bath 1  4. Walking between rooms 2  5. Putting on socks/shoes 4  6. Squatting  0  7. Lifting an object, like a bag of groceries from the floor 2  8. Performing light activities around your home 1  9. Performing heavy activities around your home 0  10. Getting into/out of  a car 4  11. Walking 2 blocks 0  12. Walking 1 mile 0  13. Going up/down 10 stairs (1 flight) 1  14. Standing for 1 hour 3  15.  sitting for 1 hour 0  16. Running on even ground 0  17. Running on uneven ground 0  18. Making sharp turns while running fast 0  19. Hopping  1  20. Rolling over in bed 2  Score total:  22 / 80 = 27.5 %  Functional limitation:   moderate     COGNITION: Overall cognitive status: Impaired (Chemo brain)  SENSATION: Chemo-based neuropathy in B hands and feet; nerve pain in legs  EDEMA:  Fluctuating edema in feet  POSTURE:  No Significant postural limitations  MUSCLE LENGTH: Hamstrings: mild tight B Piriformis: mod/severe tight R, mild tight L  LOWER EXTREMITY ROM: Grossly WFL other than limitation in hip rotation R>L  LOWER EXTREMITY MMT:  MMT Right eval Left eval  Hip flexion 4- 4-  Hip extension 3+ 4-  Hip abduction 3+ 3+  Hip adduction 3- 3-  Hip internal rotation 4 4  Hip external rotation 4- 4-  Knee flexion 4- 4-  Knee extension 4 4  Ankle dorsiflexion 4 4-  Ankle plantarflexion 3 (4 SLS HR) 3 (4 SLS HR)  Ankle inversion    Ankle eversion     (Blank rows = not tested)  FUNCTIONAL TESTS:  5 times sit to stand: 30.87 sec (several tries/attempts necessary on some reps) Timed up and go (TUG): 13.84 sec 10 meter walk test: 11.32 sec  Gait speed: 2.90 ft/sec [employer]  Scale: 08/01/24 - 35/56; < 36 high risk for falls (close to 100%) Functional gait assessment: 08/01/24 - 7/30; < 19 = high risk fall  Interpretation of scores: Non-Specific Older Adults Cutoff Score: <=22/30 = risk of falls Parkinson's Disease Cutoff score <15/30= fall risk (Hoehn & Yahr 1-4)  Minimally Clinically Important Difference (MCID)  Stroke (acute, subacute, and chronic) = MDC: 4.2 points Vestibular (acute) = MDC: 6 points Community Dwelling Older Adults =  MCID: 4 points Parkinson's Disease  =  MDC: 4.3 points  (Academy of Neurologic Physical Therapy  (nd). Functional Gait Assessment. Retrieved from https://www.neuropt.org/docs/default-source/cpgs/core-outcome-measures/function-gait-assessment-pocket-guide-proof9-(2).pdf?sfvrsn=b14f35043_0.)  GAIT: Distance walked: clinic distances Assistive device utilized: None Level of assistance: Complete Independence and SBA Gait pattern: decreased gait velocity, decreased stride length, lateral hip instability, and wide BOS   TODAY'S TREATMENT:  08/10/24 NEUROMUSCULAR RE-EDUCATION: Tandem gait along counter 2x down/back intermittent support Standing on airex:  Normal BOS balance x 1 min- wobbly  Narrow BOS balance x 1 min- wobbly  EC x 1 min  Normal BOS w/ head turns x5; trunk rotations x 5  THERAPEUTIC EXERCISE:  UBE L1.0 3 min fwd/back Bridges with PPT 2x10 Supine feet on orange pball:  DKTC x 10  Trunk rotation x 10 B  Iso HS curl + PPT x 10 B  THERAPEUTIC ACTIVITIES:  Standing hip abduction x 10 BLE Standing hip extension x 10 BLE Standing marching x 10 BLE Standing heel/toe raises x 10 BLE Sit to stands 2x5   08/08/24 NEUROMUSCULAR RE-EDUCATION: To improve coordination, kinesthesia, posture, and proprioception.  Standing heel/toe raises x 10 B- needed to sit after each exercise reporting dizziness for ~2-3 seconds Seated ab sets with beach ball 2x10 Seated B Shoulder ER and hip ER 2x10 Tandem stance x 30 BLE Clock balance R/L 12 to 6 o'clock 1x around Supine PPT 2x10 Supine marches with PPT x 10 B Bridges x 10  THERAPEUTIC EXERCISE: To improve strength, endurance, ROM, and flexibility Nustep L2x56min Brief HEP review LTR both ways x 10 Supine SLR x 10 BLE Supine SKTC stretch x 30 R/L Supine figure 4 stretch x 30 R/L  08/01/2024  PHYSICAL PERFORMANCE TEST or MEASUREMENT:  Berg Balance Test   Sit to Stand Able to stand  independently using hands    Standing Unsupported Able to stand 30 seconds unsupported   had to stop due to feeling dizzy   Sitting with Back  Unsupported but Feet Supported on Floor or Stool Able to sit safely and securely 2 minutes    Stand to Sit Uses backs of legs against chair to control descent    Transfers Able to transfer safely, definite need of hands    Standing Unsupported with Eyes Closed Able to stand 10 seconds with supervision    Standing Unsupported with Feet Together Able to place feet together independently but unable to hold for 30 seconds    From Standing, Reach Forward with Outstretched Arm Can reach confidently >25 cm (10)    From Standing Position, Pick up Object from Floor Able to pick up shoe, needs supervision    From Standing Position, Turn to Look Behind Over each Shoulder Turn sideways only but maintains balance    Turn 360 Degrees Needs close supervision or verbal cueing    Standing Unsupported, Alternately Place Feet on Step/Stool Able to stand independently and complete 8 steps >20 seconds    Standing Unsupported, One Foot in Front Needs help to step but can hold 15 seconds    Standing on  One Leg Able to lift leg independently and hold equal to or more than 3 seconds    Total Score 35    Berg comment: < 36 high risk for falls (close to 100%)      Functional Gait  Assessment   Gait Level Surface Walks 20 ft, slow speed, abnormal gait pattern, evidence for imbalance or deviates 10-15 in outside of the 12 in walkway width. Requires more than 7 sec to ambulate 20 ft.    Change in Gait Speed Makes only minor adjustments to walking speed, or accomplishes a change in speed with significant gait deviations, deviates 10-15 in outside the 12 in walkway width, or changes speed but loses balance but is able to recover and continue walking.    Gait with Horizontal Head Turns Performs head turns with moderate changes in gait velocity, slows down, deviates 10-15 in outside 12 in walkway width but recovers, can continue to walk.    Gait with Vertical Head Turns Performs task with moderate change in gait velocity, slows  down, deviates 10-15 in outside 12 in walkway width but recovers, can continue to walk.    Gait and Pivot Turn Turns slowly, requires verbal cueing, or requires several small steps to catch balance following turn and stop    Step Over Obstacle Is able to step over one shoe box (4.5 in total height) but must slow down and adjust steps to clear box safely. May require verbal cueing.    Gait with Narrow Base of Support Ambulates less than 4 steps heel to toe or cannot perform without assistance.    Gait with Eyes Closed Cannot walk 20 ft without assistance, severe gait deviations or imbalance, deviates greater than 15 in outside 12 in walkway width or will not attempt task.    Ambulating Backwards Walks 20 ft, slow speed, abnormal gait pattern, evidence for imbalance, deviates 10-15 in outside 12 in walkway width.    Steps Cannot do safely.    Total Score 7    FGA comment: < 19 = high risk fall       THERAPEUTIC EXERCISE: To improve strength and endurance.  Demonstration, verbal and tactile cues throughout for technique.  Seated heel toe raises x 10 Seated hip ADD isometrics with ball 10 x 5 Seated LAQ x 10 Seated YTB hip flexion march x 10 Seated B hip ABD with looped YTB at distal thighs x 10 - better tolerated isolating 1 leg at a time   07/30/2024  SELF CARE:  Reviewed eval findings and role of PT in addressing identified deficits as well as instruction in initial HEP (see below).    PATIENT EDUCATION:  Education details: HEP update- standing exercises added 08/10/24 Person educated: Patient Education method: Explanation, Demonstration, and Verbal cues Education comprehension: verbalized understanding, returned demonstration, verbal cues required, and needs further education  HOME EXERCISE PROGRAM: Access Code: [email or web address] URL: [email or web address] Date: 08/10/2024 Prepared by: Arlette Schaad  Exercises - Seated Heel Toe Raises  - 1 x daily - 5-7 x weekly - 1-2 sets -  8-10 reps - 3 sec hold - Seated March with Resistance  - 1 x daily - 5-7 x weekly - 1-2 sets - 8-10 reps - 3 sec hold - Seated Hip Abduction with Resistance  - 1 x daily - 5-7 x weekly - 1-2 sets - 8-10 reps - 3-5 sec hold - Seated Hip Adduction Isometrics with Ball  - 1 x daily - 5-7 x weekly - 1-2 sets - 8-10  reps - 3-5 sec hold - Seated Long Arc Quad  - 1 x daily - 5-7 x weekly - 1-2 sets - 8-10 reps - 3 sec hold - Standing Hip Abduction with Counter Support  - 1 x daily - 7 x weekly - 1-2 sets - 10 reps - Standing Hip Extension with Counter Support  - 1 x daily - 7 x weekly - 1-2 sets - 10 reps - Standing March with Counter Support  - 1 x daily - 7 x weekly - 1-2 sets - 10 reps   ASSESSMENT:  CLINICAL IMPRESSION: Pt showed increased capacity for interventions today as she did not have chemo. Skilled interventions targeted static balance deficits and decreased ability to stand for long periods of time. Pt shows moderate instability whilst on airex pad standing, would recommend more work on this.  Hartlee Amedee will benefit from continued skilled PT to address ongoing strength and balance deficits to improve mobility and activity tolerance with decreased pain interference and decreased risk for falls.   EVAL: JANYRA BARILLAS is a 58 y.o. female who was referred to physical therapy for evaluation and treatment for LE weakness and deconditioning 2 metastatic breast cancer.  She reports she experienced a recurrence of her cancer in December 2024 and ever since she has been losing ~10# per month and she feels like some of the weight loss has been related to muscle mass loss leading to generalized weakness and poor endurance with ADLs, daily mobility and household tasks.  Her energy levels are also impacted by low blood counts and she also experiences chemo-induced peripheral neuropathy as well as intermittent nerve pain.  Patient has deficits in hip ROM, B LE flexibility, B LE strength, and  impaired balance and activity tolerance which are interfering with ADLs and are impacting quality of life.  On LEFS patient scored 22/80 demonstrating moderate functional limitation.  Opie Fanton will benefit from skilled PT to address above deficits to improve mobility and activity tolerance with decreased pain interference.  OBJECTIVE IMPAIRMENTS: Abnormal gait, cardiopulmonary status limiting activity, decreased activity tolerance, decreased endurance, decreased knowledge of use of DME, decreased mobility, difficulty walking, decreased strength, decreased safety awareness, dizziness, impaired flexibility, impaired sensation, postural dysfunction, and pain.   ACTIVITY LIMITATIONS: carrying, lifting, bending, sitting, standing, squatting, stairs, transfers, bed mobility, bathing, dressing, hygiene/grooming, and locomotion level  PARTICIPATION LIMITATIONS: meal prep, cleaning, laundry, driving, shopping, community activity, and yard work  PERSONAL FACTORS: Fitness, Past/current experiences, Time since onset of injury/illness/exacerbation, and 3+ comorbidities: Angina; anxiety; arthritis - knee, hips, back and neck (4 bulging discs in neck); asthma; bipolar 1; breast cancer 2019 and recurrent 2023 to right breast, right axillary, and left neck; R LE DVT; DM-II; HLD; MI; pancreatitis; dizziness are also affecting patient's functional outcome.   REHAB POTENTIAL: Good  CLINICAL DECISION MAKING: Unstable/unpredictable  EVALUATION COMPLEXITY: High   GOALS: Goals reviewed with patient? Yes  SHORT TERM GOALS: Target date: 09/10/2024  Patient will be independent with initial HEP. Baseline: Initial HEP provided on eval Goal status: IN PROGRESS - 08/01/24 - reviewed initial HEP  2.  Complete standardized balance testing and update LTG's accordingly. Baseline: Berg & FGA pending Goal status: MET- updated 10/17- completed BERG and FGA 08/01/24  3.  Patient will improve 5x STS time to </= 24  seconds for improved efficiency and safety with transfers. Baseline: 30.87 sec Goal status: INITIAL  LONG TERM GOALS: Target date: 10/22/2024  Patient will be independent with advanced/ongoing HEP to improve outcomes  and carryover.  Baseline:  Goal status: INITIAL  2.  Patient will demonstrate improved B LE strength to >/= 4 to 4+/5 for improved stability and ease of mobility. Baseline: Refer to above LE MMT table Goal status: INITIAL  3.  Patient will be able to ambulate 600' w/o AD and normal gait pattern without increased pain to access community.  Baseline: very limited ambulation tolerance  Goal status: INITIAL  4. Patient will report >/= 35/80 on LEFS (MCID = 9 pts) to demonstrate improved functional ability. Baseline: 22 / 80 = 27.5 % Goal status: INITIAL  5.  Patient will demonstrate at least 19/30 on FGA to decrease risk of falls. Baseline: 08/01/24 - 7/30; < 19 = high risk fall  Goal status: INITIAL   6.  Patient will improve Berg score by at least 8 points to improve safety and stability with ADLs in standing and reduce risk for falls Baseline: 08/01/24 - 35/56; < 36 high risk for falls (close to 100%) Goal status: INITIAL   PLAN:  PT FREQUENCY: 2x/week  PT DURATION: 12 weeks  PLANNED INTERVENTIONS: [id number]- PT Re-evaluation, 97750- Physical Performance Testing, 97110-Therapeutic exercises, 97530- Therapeutic activity, 97112- Neuromuscular re-education, 97535- Self Care, [id number]- Manual therapy, [id number]- Gait training, [phone]- Aquatic Therapy, [phone]- Electrical stimulation (unattended), [id number]- Ultrasound, [id number]- Ionotophoresis 4mg /ml Dexamethasone , 79439 (1-2 muscles), [id number] (3+ muscles)- Dry Needling, Patient/Family education, Balance training, Stair training, Taping, Vestibular training, DME instructions, Cryotherapy, and Moist heat  PLAN FOR NEXT SESSION: Gently progress core/lumbopelvic and LE strengthening; balance activities as tolerated   Jahnavi Muratore L Jennell Janosik,  PTA 08/10/2024, 10:15 AM

## 2024-08-13 ENCOUNTER — Other Ambulatory Visit: Payer: Self-pay | Admitting: *Deleted

## 2024-08-13 MED ORDER — TALAZOPARIB TOSYLATE 0.35 MG PO CAPS: ORAL_CAPSULE | ORAL | 6 refills | Status: DC

## 2024-08-14 ENCOUNTER — Other Ambulatory Visit: Payer: Self-pay

## 2024-08-15 ENCOUNTER — Ambulatory Visit

## 2024-08-15 DIAGNOSIS — M6281 Muscle weakness (generalized): Secondary | ICD-10-CM | POA: Diagnosis not present

## 2024-08-15 DIAGNOSIS — R2681 Unsteadiness on feet: Secondary | ICD-10-CM

## 2024-08-15 DIAGNOSIS — R262 Difficulty in walking, not elsewhere classified: Secondary | ICD-10-CM

## 2024-08-15 NOTE — Therapy (Signed)
 OUTPATIENT PHYSICAL THERAPY TREATMENT   Patient Name: Ariana Herrera MRN: [id number] DOB: 10-28-1965, 58 y.o., female Today's Date: 08/15/2024   END OF SESSION:  PT End of Session - 08/15/24 1112     Visit Number 5    Date for Recertification  10/22/24    Authorization Type [facility] Medicaid    Authorization Time Period Auth starting 08/25/24    PT Start Time 1019    PT Stop Time 1100    PT Time Calculation (min) 41 min    Activity Tolerance Patient tolerated treatment well    Behavior During Therapy WFL for tasks assessed/performed              Past Medical History:  Diagnosis Date   Anginal pain    Anxiety    Arthritis    Asthma    Bipolar 1 disorder (HCC)    Bipolar disease, chronic (HCC)    Breast cancer in female (HCC)    2019 and recurrent in 2023 to right breast, right axillary, and left neck   Coronary artery disease    Deep vein blood clot of right lower extremity (HCC)    Diabetes mellitus type 2 in obese    Goals of care, counseling/discussion 11/23/2021   History of external beam radiation therapy    Left neck-10/07/23-11/24/23-[facility]   Hyperlipidemia    Lymphedema    MI (myocardial infarction) (HCC)    was in [place]    Obesity    Pancreatitis    Personal history of chemotherapy    Personal history of radiation therapy    Sleep apnea    Past Surgical History:  Procedure Laterality Date   BREAST BIOPSY Left 10/03/2023   times 2   BREAST LUMPECTOMY Left 03/2019   CATARACT EXTRACTION Bilateral    CHOLECYSTECTOMY     IR IMAGING GUIDED PORT INSERTION  12/15/2021   IR PATIENT EVAL TECH 0-60 MINS  10/06/2023   IR REMOVAL TUN ACCESS W/ PORT W/O FL MOD SED  12/20/2019   IR US  GUIDE BX ASP/DRAIN  11/02/2021   LEFT HEART CATH AND CORONARY ANGIOGRAPHY N/A 08/22/2020   Procedure: LEFT HEART CATH AND CORONARY ANGIOGRAPHY;  Surgeon: Verlin Lonni BIRCH, MD;  Location: [facility];  Service: Cardiovascular;  Laterality: N/A;   TRIGGER  FINGER RELEASE Bilateral    x 5   TUBAL LIGATION     UMBILICAL HERNIA REPAIR     x 2   uterine ablation     Patient Active Problem List   Diagnosis Date Noted   Internal jugular (IJ) vein thromboembolism, acute (HCC) 10/06/2023   Facial swelling 10/05/2023   Tobacco use 07/14/2023   Type 2 diabetes mellitus with hyperglycemia, with long-term current use of insulin  (HCC) 06/23/2023   Stress incontinence of urine 06/23/2023   Moderate persistent asthma without complication 06/23/2023   Obesity (BMI 30-39.9) 06/23/2023   Encounter for orthopedic follow-up care 09/23/2021   Acquired trigger finger of left middle finger 08/27/2021   Chest pain of uncertain etiology    History of MI (myocardial infarction) 08/20/2020   History of pancreatitis 08/20/2020   Hyperlipidemia associated with type 2 diabetes mellitus (HCC) 08/20/2020   Bipolar disease, chronic (HCC)    Breast cancer (HCC) 11/26/2019   Chemotherapy-induced neuropathy 11/26/2019   History of DVT (deep vein thrombosis) 11/26/2019    PCP: Cyndi Shaver, PA-C (Inactive)   REFERRING PROVIDER: Timmy Maude SAUNDERS, MD   REFERRING DIAG: C50.012 (ICD-10-CM) - Malignant neoplasm involving both nipple  and areola of left breast in female, unspecified estrogen receptor status (HCC)   Per MD order: She is a bit deconditioned. She has lost weight. She does have breast cancer. I think she just needs some strengthening exercises for her legs.   THERAPY DIAG:  Muscle weakness (generalized)  Difficulty in walking, not elsewhere classified  Unsteadiness on feet  RATIONALE FOR EVALUATION AND TREATMENT: Rehabilitation  ONSET DATE: ~Dec 2024  NEXT MD VISIT: 08/24/24   SUBJECTIVE:                                                                                                                                                                                                         SUBJECTIVE STATEMENT: Pt reports energy 50/50 today, some L  knee pain as well   EVAL: Pt reports her cancer returned in Dec 2024 and ever since she has been losing ~10# per month and she feels like her muscles have gone with the weight.  Now has difficulty with housework and working the yard or garden.  Unable to go shopping w/o using an electric cart. Also reports nerve pain at times.  She is in remission but does have terminal metastatic breast cancer, currently taking oral chemo - 3 weeks on/1 week off.  She states her blood counts are currently low.  PAIN: Are you having pain? Yes: NPRS scale: 5/10  Pain location: L knee  Pain description: ache  Aggravating factors: walking  Relieving factors: laying down, sitting   PERTINENT HISTORY:  Angina; anxiety; arthritis - knee, hips, back and neck (4 bulging discs in neck); asthma; bipolar 1; breast cancer 2019 and recurrent 2023 to right breast, right axillary, and left neck; R LE DVT; DM-II; HLD; MI; pancreatitis; dizziness  PRECAUTIONS: None  RED FLAGS: None  WEIGHT BEARING RESTRICTIONS: No  FALLS:  Has patient fallen in last 6 months? No  LIVING ENVIRONMENT: Lives with: lives with their spouse and lives with their son Lives in: Mobile home - 5th wheel Stairs: Yes: External: 3 steps; on left going up Has following equipment at home: Single point cane, Environmental consultant - 2 wheeled, Wheelchair (manual), and shower chair  OCCUPATION: On disability  PLOF: Independent with basic ADLs, Needs assistance with homemaking, and Leisure: gardening, travel - unable to participate in leisure activities right now  PATIENT GOALS: Be able to stand longer periods of time - get strength back in my legs. Walk around easier.   OBJECTIVE: (objective measures completed at initial evaluation unless otherwise dated)  DIAGNOSTIC FINDINGS:  06/11/24 - CT ANGIOGRAPHY CHEST WITH CONTRAST  IMPRESSION: 1. Tiny subsegmental pulmonary embolism in a posterior left lower lobe pulmonary artery. No evidence of right heart  strain. 2. Small pericardial effusion. 3. Postsurgical changes in the left breast with diffuse dermal and trabecular thickening similar to prior. Findings in the neck reported separately. 4. Unchanged vague nodularity in the left supraclavicular region. 5. Aortic Atherosclerosis (ICD10-I70.0).   06/03/24 - MR NECK WITH AND WITHOUT INTRAVENOUS CONTRAST IMPRESSION: 1. No acute findings within the soft tissues of the neck. 2. Mild to moderate degenerative changes in the cervical spine as described above. No abnormal enhancement of the spinal cord or nerve roots. 3. Interval resolution of left supraclavicular lymphadenopathy and soft tissue stranding.  03/02/24 - MRI HEAD WITHOUT AND WITH CONTRAST IMPRESSION: No acute intracranial abnormality. No findings to suggest intracranial metastatic disease.   Punctate susceptibility in the right parietal white matter slightly more conspicuous on current study likely due to differences in technique. No associated edema or enhancement. Findings likely reflect chronic microhemorrhages. Recommend attention on follow-up.   Similar 7 mm dural-based enhancing lesion over the left middle frontal gyrus suggestive of a small meningioma.   Trace mastoid effusions.  PATIENT SURVEYS:  LEFS  Extreme difficulty/unable (0), Quite a bit of difficulty (1), Moderate difficulty (2), Little difficulty (3), No difficulty (4) Survey date:  07/30/24  Any of your usual work, housework or school activities 1  2. Usual hobbies, recreational or sporting activities 0  3. Getting into/out of the bath 1  4. Walking between rooms 2  5. Putting on socks/shoes 4  6. Squatting  0  7. Lifting an object, like a bag of groceries from the floor 2  8. Performing light activities around your home 1  9. Performing heavy activities around your home 0  10. Getting into/out of a car 4  11. Walking 2 blocks 0  12. Walking 1 mile 0  13. Going up/down 10 stairs (1 flight) 1  14.  Standing for 1 hour 3  15.  sitting for 1 hour 0  16. Running on even ground 0  17. Running on uneven ground 0  18. Making sharp turns while running fast 0  19. Hopping  1  20. Rolling over in bed 2  Score total:  22 / 80 = 27.5 %  Functional limitation:   moderate     COGNITION: Overall cognitive status: Impaired (Chemo brain)  SENSATION: Chemo-based neuropathy in B hands and feet; nerve pain in legs  EDEMA:  Fluctuating edema in feet  POSTURE:  No Significant postural limitations  MUSCLE LENGTH: Hamstrings: mild tight B Piriformis: mod/severe tight R, mild tight L  LOWER EXTREMITY ROM: Grossly WFL other than limitation in hip rotation R>L  LOWER EXTREMITY MMT:  MMT Right eval Left eval  Hip flexion 4- 4-  Hip extension 3+ 4-  Hip abduction 3+ 3+  Hip adduction 3- 3-  Hip internal rotation 4 4  Hip external rotation 4- 4-  Knee flexion 4- 4-  Knee extension 4 4  Ankle dorsiflexion 4 4-  Ankle plantarflexion 3 (4 SLS HR) 3 (4 SLS HR)  Ankle inversion    Ankle eversion     (Blank rows = not tested)  FUNCTIONAL TESTS:  5 times sit to stand: 30.87 sec (several tries/attempts necessary on some reps) Timed up and go (TUG): 13.84 sec 10 meter walk test: 11.32 sec  Gait speed: 2.90 ft/sec Berg Balance Scale: 08/01/24 - 35/56; < 36 high risk for falls (close to 100%) Functional  gait assessment: 08/01/24 - 7/30; < 19 = high risk fall  Interpretation of scores: Non-Specific Older Adults Cutoff Score: <=22/30 = risk of falls Parkinson's Disease Cutoff score <15/30= fall risk (Hoehn & Yahr 1-4)  Minimally Clinically Important Difference (MCID)  Stroke (acute, subacute, and chronic) = MDC: 4.2 points Vestibular (acute) = MDC: 6 points Community Dwelling Older Adults =  MCID: 4 points Parkinson's Disease  =  MDC: 4.3 points  (Academy of Neurologic Physical Therapy (nd). Functional Gait Assessment. Retrieved from  https://www.neuropt.org/docs/default-source/cpgs/core-outcome-measures/function-gait-assessment-pocket-guide-proof9-(2).pdf?sfvrsn=b61f35043_0.)  GAIT: Distance walked: clinic distances Assistive device utilized: None Level of assistance: Complete Independence and SBA Gait pattern: decreased gait velocity, decreased stride length, lateral hip instability, and wide BOS   TODAY'S TREATMENT:  08/15/24 Nustep L3x58min UE/LE Supine DKTC orange ball x 20 Leg curls 10lb x 10 BLE- lightning bolt feeling in L calf Leg ext 5lb 2x10 BLE Seated hip ABD RTB x 10 Seated marching RTB x 10  NEUROMUSCULAR RE-EDUCATION: 5xSTS- 17.37 seconds Standing on airex:  Narrow BOS balance x 1 min  EC x 1 min  Normal BOS w/ head turns x5; trunk rotations x 5  Heel/toe raises x 10 B Bridges 2x10 orange pball  08/10/24 NEUROMUSCULAR RE-EDUCATION: Tandem gait along counter 2x down/back intermittent support Standing on airex:  Normal BOS balance x 1 min- wobbly  Narrow BOS balance x 1 min- wobbly  EC x 1 min  Normal BOS w/ head turns x5; trunk rotations x 5  THERAPEUTIC EXERCISE:  UBE L1.0 3 min fwd/back Bridges with PPT 2x10 Supine feet on orange pball:  DKTC x 10  Trunk rotation x 10 B  Iso HS curl + PPT x 10 B  THERAPEUTIC ACTIVITIES:  Standing hip abduction x 10 BLE Standing hip extension x 10 BLE Standing marching x 10 BLE Standing heel/toe raises x 10 BLE Sit to stands 2x5   08/08/24 NEUROMUSCULAR RE-EDUCATION: To improve coordination, kinesthesia, posture, and proprioception.  Standing heel/toe raises x 10 B- needed to sit after each exercise reporting dizziness for ~2-3 seconds Seated ab sets with beach ball 2x10 Seated B Shoulder ER and hip ER 2x10 Tandem stance x 30 BLE Clock balance R/L 12 to 6 o'clock 1x around Supine PPT 2x10 Supine marches with PPT x 10 B Bridges x 10  THERAPEUTIC EXERCISE: To improve strength, endurance, ROM, and flexibility Nustep L2x71min Brief HEP  review LTR both ways x 10 Supine SLR x 10 BLE Supine SKTC stretch x 30 R/L Supine figure 4 stretch x 30 R/L  08/01/2024  PHYSICAL PERFORMANCE TEST or MEASUREMENT:  Berg Balance Test   Sit to Stand Able to stand  independently using hands    Standing Unsupported Able to stand 30 seconds unsupported   had to stop due to feeling dizzy   Sitting with Back Unsupported but Feet Supported on Floor or Stool Able to sit safely and securely 2 minutes    Stand to Sit Uses backs of legs against chair to control descent    Transfers Able to transfer safely, definite need of hands    Standing Unsupported with Eyes Closed Able to stand 10 seconds with supervision    Standing Unsupported with Feet Together Able to place feet together independently but unable to hold for 30 seconds    From Standing, Reach Forward with Outstretched Arm Can reach confidently >25 cm (10)    From Standing Position, Pick up Object from Floor Able to pick up shoe, needs supervision    From Standing Position, Turn  to Look Behind Over each Shoulder Turn sideways only but maintains balance    Turn 360 Degrees Needs close supervision or verbal cueing    Standing Unsupported, Alternately Place Feet on Step/Stool Able to stand independently and complete 8 steps >20 seconds    Standing Unsupported, One Foot in Front Needs help to step but can hold 15 seconds    Standing on One Leg Able to lift leg independently and hold equal to or more than 3 seconds    Total Score 35    Berg comment: < 36 high risk for falls (close to 100%)      Functional Gait  Assessment   Gait Level Surface Walks 20 ft, slow speed, abnormal gait pattern, evidence for imbalance or deviates 10-15 in outside of the 12 in walkway width. Requires more than 7 sec to ambulate 20 ft.    Change in Gait Speed Makes only minor adjustments to walking speed, or accomplishes a change in speed with significant gait deviations, deviates 10-15 in outside the 12 in walkway  width, or changes speed but loses balance but is able to recover and continue walking.    Gait with Horizontal Head Turns Performs head turns with moderate changes in gait velocity, slows down, deviates 10-15 in outside 12 in walkway width but recovers, can continue to walk.    Gait with Vertical Head Turns Performs task with moderate change in gait velocity, slows down, deviates 10-15 in outside 12 in walkway width but recovers, can continue to walk.    Gait and Pivot Turn Turns slowly, requires verbal cueing, or requires several small steps to catch balance following turn and stop    Step Over Obstacle Is able to step over one shoe box (4.5 in total height) but must slow down and adjust steps to clear box safely. May require verbal cueing.    Gait with Narrow Base of Support Ambulates less than 4 steps heel to toe or cannot perform without assistance.    Gait with Eyes Closed Cannot walk 20 ft without assistance, severe gait deviations or imbalance, deviates greater than 15 in outside 12 in walkway width or will not attempt task.    Ambulating Backwards Walks 20 ft, slow speed, abnormal gait pattern, evidence for imbalance, deviates 10-15 in outside 12 in walkway width.    Steps Cannot do safely.    Total Score 7    FGA comment: < 19 = high risk fall       THERAPEUTIC EXERCISE: To improve strength and endurance.  Demonstration, verbal and tactile cues throughout for technique.  Seated heel toe raises x 10 Seated hip ADD isometrics with ball 10 x 5 Seated LAQ x 10 Seated YTB hip flexion march x 10 Seated B hip ABD with looped YTB at distal thighs x 10 - better tolerated isolating 1 leg at a time   07/30/2024  SELF CARE:  Reviewed eval findings and role of PT in addressing identified deficits as well as instruction in initial HEP (see below).    PATIENT EDUCATION:  Education details: HEP update- standing exercises added 08/10/24 Person educated: Patient Education method: Explanation,  Demonstration, and Verbal cues Education comprehension: verbalized understanding, returned demonstration, verbal cues required, and needs further education  HOME EXERCISE PROGRAM: Access Code: [email or web address] URL: [email or web address] Date: 08/10/2024 Prepared by: Halena Mohar  Exercises - Seated Heel Toe Raises  - 1 x daily - 5-7 x weekly - 1-2 sets - 8-10 reps - 3 sec hold - Seated March  with Resistance  - 1 x daily - 5-7 x weekly - 1-2 sets - 8-10 reps - 3 sec hold - Seated Hip Abduction with Resistance  - 1 x daily - 5-7 x weekly - 1-2 sets - 8-10 reps - 3-5 sec hold - Seated Hip Adduction Isometrics with Ball  - 1 x daily - 5-7 x weekly - 1-2 sets - 8-10 reps - 3-5 sec hold - Seated Long Arc Quad  - 1 x daily - 5-7 x weekly - 1-2 sets - 8-10 reps - 3 sec hold - Standing Hip Abduction with Counter Support  - 1 x daily - 7 x weekly - 1-2 sets - 10 reps - Standing Hip Extension with Counter Support  - 1 x daily - 7 x weekly - 1-2 sets - 10 reps - Standing March with Counter Support  - 1 x daily - 7 x weekly - 1-2 sets - 10 reps   ASSESSMENT:  CLINICAL IMPRESSION: Skilled interventions targeted static balance deficits and general strengthening to improve activity tolerance. Pt more steady on airex pad standing with some unsteadiness.   Ariana Herrera will benefit from continued skilled PT to address ongoing strength and balance deficits to improve mobility and activity tolerance with decreased pain interference and decreased risk for falls.   EVAL: Ariana Herrera is a 58 y.o. female who was referred to physical therapy for evaluation and treatment for LE weakness and deconditioning 2 metastatic breast cancer.  She reports she experienced a recurrence of her cancer in December 2024 and ever since she has been losing ~10# per month and she feels like some of the weight loss has been related to muscle mass loss leading to generalized weakness and poor endurance with ADLs, daily  mobility and household tasks.  Her energy levels are also impacted by low blood counts and she also experiences chemo-induced peripheral neuropathy as well as intermittent nerve pain.  Patient has deficits in hip ROM, B LE flexibility, B LE strength, and impaired balance and activity tolerance which are interfering with ADLs and are impacting quality of life.  On LEFS patient scored 22/80 demonstrating moderate functional limitation.  Ariana Herrera will benefit from skilled PT to address above deficits to improve mobility and activity tolerance with decreased pain interference.  OBJECTIVE IMPAIRMENTS: Abnormal gait, cardiopulmonary status limiting activity, decreased activity tolerance, decreased endurance, decreased knowledge of use of DME, decreased mobility, difficulty walking, decreased strength, decreased safety awareness, dizziness, impaired flexibility, impaired sensation, postural dysfunction, and pain.   ACTIVITY LIMITATIONS: carrying, lifting, bending, sitting, standing, squatting, stairs, transfers, bed mobility, bathing, dressing, hygiene/grooming, and locomotion level  PARTICIPATION LIMITATIONS: meal prep, cleaning, laundry, driving, shopping, community activity, and yard work  PERSONAL FACTORS: Fitness, Past/current experiences, Time since onset of injury/illness/exacerbation, and 3+ comorbidities: Angina; anxiety; arthritis - knee, hips, back and neck (4 bulging discs in neck); asthma; bipolar 1; breast cancer 2019 and recurrent 2023 to right breast, right axillary, and left neck; R LE DVT; DM-II; HLD; MI; pancreatitis; dizziness are also affecting patient's functional outcome.   REHAB POTENTIAL: Good  CLINICAL DECISION MAKING: Unstable/unpredictable  EVALUATION COMPLEXITY: High   GOALS: Goals reviewed with patient? Yes  SHORT TERM GOALS: Target date: 09/10/2024  Patient will be independent with initial HEP. Baseline: Initial HEP provided on eval Goal status: IN PROGRESS -  08/01/24 - reviewed initial HEP  2.  Complete standardized balance testing and update LTG's accordingly. Baseline: Berg & FGA pending Goal status: MET- updated 10/17- completed BERG  and FGA 08/01/24  3.  Patient will improve 5x STS time to </= 24 seconds for improved efficiency and safety with transfers. Baseline: 30.87 sec Goal status: MET- 08/15/24  LONG TERM GOALS: Target date: 10/22/2024  Patient will be independent with advanced/ongoing HEP to improve outcomes and carryover.  Baseline:  Goal status: INITIAL  2.  Patient will demonstrate improved B LE strength to >/= 4 to 4+/5 for improved stability and ease of mobility. Baseline: Refer to above LE MMT table Goal status: INITIAL  3.  Patient will be able to ambulate 600' w/o AD and normal gait pattern without increased pain to access community.  Baseline: very limited ambulation tolerance  Goal status: INITIAL  4. Patient will report >/= 35/80 on LEFS (MCID = 9 pts) to demonstrate improved functional ability. Baseline: 22 / 80 = 27.5 % Goal status: INITIAL  5.  Patient will demonstrate at least 19/30 on FGA to decrease risk of falls. Baseline: 08/01/24 - 7/30; < 19 = high risk fall  Goal status: INITIAL   6.  Patient will improve Berg score by at least 8 points to improve safety and stability with ADLs in standing and reduce risk for falls Baseline: 08/01/24 - 35/56; < 36 high risk for falls (close to 100%) Goal status: INITIAL   PLAN:  PT FREQUENCY: 2x/week  PT DURATION: 12 weeks  PLANNED INTERVENTIONS: [id number]- PT Re-evaluation, 97750- Physical Performance Testing, 97110-Therapeutic exercises, 97530- Therapeutic activity, 97112- Neuromuscular re-education, 97535- Self Care, [id number]- Manual therapy, [id number]- Gait training, [phone]- Aquatic Therapy, [phone]- Electrical stimulation (unattended), [id number]- Ultrasound, [id number]- Ionotophoresis 4mg /ml Dexamethasone , 79439 (1-2 muscles), 20561 (3+ muscles)- Dry Needling, Patient/Family  education, Balance training, Stair training, Taping, Vestibular training, DME instructions, Cryotherapy, and Moist heat  PLAN FOR NEXT SESSION: Gently progress core/lumbopelvic and LE strengthening; balance activities as tolerated   Ariana Herrera, PTA 08/15/2024, 11:16 AM

## 2024-08-16 ENCOUNTER — Ambulatory Visit (INDEPENDENT_AMBULATORY_CARE_PROVIDER_SITE_OTHER): Admitting: Family Medicine

## 2024-08-16 ENCOUNTER — Telehealth: Payer: Self-pay | Admitting: Pharmacist

## 2024-08-16 ENCOUNTER — Encounter: Payer: Self-pay | Admitting: Family Medicine

## 2024-08-16 ENCOUNTER — Other Ambulatory Visit (HOSPITAL_BASED_OUTPATIENT_CLINIC_OR_DEPARTMENT_OTHER): Payer: Self-pay

## 2024-08-16 VITALS — BP 111/62 | HR 102 | Ht 60.0 in | Wt 144.0 lb

## 2024-08-16 DIAGNOSIS — C50011 Malignant neoplasm of nipple and areola, right female breast: Secondary | ICD-10-CM | POA: Diagnosis not present

## 2024-08-16 DIAGNOSIS — Z23 Encounter for immunization: Secondary | ICD-10-CM | POA: Diagnosis not present

## 2024-08-16 DIAGNOSIS — C50012 Malignant neoplasm of nipple and areola, left female breast: Secondary | ICD-10-CM

## 2024-08-16 DIAGNOSIS — Z794 Long term (current) use of insulin: Secondary | ICD-10-CM

## 2024-08-16 DIAGNOSIS — F319 Bipolar disorder, unspecified: Secondary | ICD-10-CM

## 2024-08-16 DIAGNOSIS — I2510 Atherosclerotic heart disease of native coronary artery without angina pectoris: Secondary | ICD-10-CM

## 2024-08-16 DIAGNOSIS — Z86718 Personal history of other venous thrombosis and embolism: Secondary | ICD-10-CM

## 2024-08-16 DIAGNOSIS — E785 Hyperlipidemia, unspecified: Secondary | ICD-10-CM

## 2024-08-16 DIAGNOSIS — G620 Drug-induced polyneuropathy: Secondary | ICD-10-CM

## 2024-08-16 DIAGNOSIS — E1165 Type 2 diabetes mellitus with hyperglycemia: Secondary | ICD-10-CM | POA: Diagnosis not present

## 2024-08-16 DIAGNOSIS — E1169 Type 2 diabetes mellitus with other specified complication: Secondary | ICD-10-CM

## 2024-08-16 DIAGNOSIS — T451X5A Adverse effect of antineoplastic and immunosuppressive drugs, initial encounter: Secondary | ICD-10-CM

## 2024-08-16 MED ORDER — QUETIAPINE FUMARATE 400 MG PO TABS
800.0000 mg | ORAL_TABLET | Freq: Every day | ORAL | 2 refills | Status: AC
  Filled 2024-08-16 – 2024-09-04 (×2): qty 60, 30d supply, fill #0
  Filled 2024-09-30: qty 60, 30d supply, fill #1
  Filled 2024-10-30: qty 60, 30d supply, fill #2

## 2024-08-16 MED ORDER — ONDANSETRON 8 MG PO TBDP
8.0000 mg | ORAL_TABLET | Freq: Three times a day (TID) | ORAL | 1 refills | Status: AC | PRN
  Filled 2024-08-16: qty 30, 10d supply, fill #0

## 2024-08-16 MED ORDER — TALAZOPARIB TOSYLATE 0.35 MG PO CAPS: ORAL_CAPSULE | ORAL | 6 refills | Status: AC

## 2024-08-16 NOTE — Assessment & Plan Note (Signed)
 On Xarelto

## 2024-08-16 NOTE — Assessment & Plan Note (Signed)
 Managed with gabapentin  and PRN Norco (prescribed by specialist)

## 2024-08-16 NOTE — Assessment & Plan Note (Signed)
Asymptomatic. Following with cardiology.

## 2024-08-16 NOTE — Telephone Encounter (Signed)
 Oral Oncology Pharmacist Encounter  [place]  prescription quantity updated from #30 to #21 to match change in directions (patient will now be dosing 21 days on, 7 days off, repeated every 28 days).  Asberry Macintosh, PharmD, BCPS, BCOP Hematology/Oncology Clinical Pharmacist [phone] 08/16/2024 9:37 AM

## 2024-08-16 NOTE — Assessment & Plan Note (Addendum)
 Discussed with patient that I do not typically manage Bipolar, but given that her regimen has been stable for many years and she prefers to avoid another specialist, will continue Seroquel  refills. If any changes need to be made, will have to refer to psych. Patient agreeable. No SI/HI.   Anxiety and bipolar disorder managed on Seroquel . Olanzapine  interaction risk for QT/arrhythmia noted. - Taper olanzapine : reduce to three-quarters for a week, then half for a week, then one-quarter for a week, and discontinue. - Discuss alternative nausea management with Doctor Ennever. - Continue Seroquel  as prescribed.

## 2024-08-16 NOTE — Assessment & Plan Note (Signed)
 Following with endocrinology. Due to see them next week.

## 2024-08-16 NOTE — Assessment & Plan Note (Signed)
 On oral chemo. Following with Dr. Timmy.

## 2024-08-16 NOTE — Progress Notes (Signed)
 Established Patient Office Visit  Subjective   Patient ID: Ariana Herrera, female    DOB: 1966-05-06  Age: 58 y.o. MRN: [id number]  Chief Complaint  Patient presents with   Medical Management of Chronic Issues    HPI    Discussed the use of AI scribe software for clinical note transcription with the patient, who gave verbal consent to proceed.  History of Present Illness Ariana Herrera is a 58 year old female who presents to transfer care.  Her previous doctor moved away, and she is seeking to transfer her care and obtain medication refills. She has a history of breast cancer, initially diagnosed in 2019 with a recurrence in 2023. She takes daily chemotherapy pills as part of her treatment regimen. She is under the care of oncology, Dr. Timmy.  She has type 2 diabetes, managed with an insulin  pump and a sensor. Her last A1c was 6.6. She takes metformin  1000 mg twice a day and is followed by an endocrinologist ([facility]).  She has coronary artery disease and a history of a heart attack. She takes Crestor  (rosuvastatin ) for cholesterol management and Xarelto  for a history of DVTs. She is followed by a cardiologist at [facility].  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 She takes gabapentin  400 mg three times a day and Norco for pain management.  She has a history of anxiety and bipolar disorder, managed with Seroquel , which also aids her sleep. She does not have a psychiatrist but reports feeling well-controlled on her current regimen. States her regimen has been stable for many years now and she prefers not to have another specialist at this time.   She has a history of sleep apnea and uses a CPAP machine. She also has a history of asthma, for which she uses albuterol  as needed.  She is disabled due to her cancer diagnosis and does not work.            ROS All review of  systems negative except what is listed in the HPI    Objective:     BP 111/62   Pulse (!) 102   Ht 5' (1.524 m)   Wt 144 lb (65.3 kg)   LMP 10/04/2016 Comment: after procedure to stop bleeding  SpO2 100%   BMI 28.12 kg/m    Physical Exam Vitals reviewed.  Constitutional:      Appearance: Normal appearance.  Cardiovascular:     Rate and Rhythm: Normal rate and regular rhythm.  Neurological:     Mental Status: She is alert and oriented to person, place, and time.  Psychiatric:        Mood and Affect: Mood normal.        Behavior: Behavior normal.        Thought Content: Thought content normal.        Judgment: Judgment normal.      No results found for any visits on 08/16/24.    The ASCVD Risk score (Arnett DK, et al., 2019) failed to calculate for the following reasons:   Risk score cannot be calculated because patient has a medical history suggesting prior/existing ASCVD    Assessment & Plan:   Problem List Items Addressed This Visit       Active Problems   Breast cancer (HCC)   On oral chemo. Following with Dr. Timmy.       Relevant Medications   ondansetron  (ZOFRAN -ODT) 8 MG  disintegrating tablet   Chemotherapy-induced neuropathy   Managed with gabapentin  and PRN Norco (prescribed by specialist)      Relevant Medications   QUEtiapine  (SEROQUEL ) 400 MG tablet   History of DVT (deep vein thrombosis)   On Xarelto .       Bipolar disease, chronic (HCC)   Discussed with patient that I do not typically manage Bipolar, but given that her regimen has been stable for many years and she prefers to avoid another specialist, will continue Seroquel  refills. If any changes need to be made, will have to refer to psych. Patient agreeable. No SI/HI.   Anxiety and bipolar disorder managed on Seroquel . Olanzapine  interaction risk for QT/arrhythmia noted. - Taper olanzapine : reduce to three-quarters for a week, then half for a week, then one-quarter for a week, and  discontinue. - Discuss alternative nausea management with Doctor Ennever. - Continue Seroquel  as prescribed.      Relevant Medications   QUEtiapine  (SEROQUEL ) 400 MG tablet   Hyperlipidemia associated with type 2 diabetes mellitus (HCC)   Continue rosuvastatin  and fenofibrate . Following with cardiology Lifestyle factors for lowering cholesterol include: Diet therapy - heart-healthy diet rich in fruits, veggies, fiber-rich whole grains, lean meats, chicken, fish (at least twice a week), fat-free or 1% dairy products; foods low in saturated/trans fats, cholesterol, sodium, and sugar. Mediterranean diet has shown to be very heart healthy. Regular exercise - recommend at least 30 minutes a day, 5 times per week Weight management        Type 2 diabetes mellitus with hyperglycemia, with long-term current use of insulin  (HCC) - Primary   Following with endocrinology. Due to see them next week.       Coronary artery disease involving native coronary artery of native heart without angina pectoris   Asymptomatic.  Following with cardiology.        Other Visit Diagnoses       Immunization due       Relevant Orders   Flu vaccine trivalent PF, 6mos and older(Flulaval,Afluria,Fluarix,Fluzone) (Completed)     Malignant neoplasm of nipple and areola, right female breast (HCC)       Relevant Medications   ondansetron  (ZOFRAN -ODT) 8 MG disintegrating tablet         Return in about 4 months (around 12/17/2024) for chronic disease management.    Waddell KATHEE Mon, NP

## 2024-08-16 NOTE — Assessment & Plan Note (Signed)
 Continue rosuvastatin  and fenofibrate . Following with cardiology Lifestyle factors for lowering cholesterol include: Diet therapy - heart-healthy diet rich in fruits, veggies, fiber-rich whole grains, lean meats, chicken, fish (at least twice a week), fat-free or 1% dairy products; foods low in saturated/trans fats, cholesterol, sodium, and sugar. Mediterranean diet has shown to be very heart healthy. Regular exercise - recommend at least 30 minutes a day, 5 times per week Weight management

## 2024-08-17 ENCOUNTER — Other Ambulatory Visit: Payer: Self-pay

## 2024-08-17 ENCOUNTER — Ambulatory Visit: Admitting: Physical Therapy

## 2024-08-17 ENCOUNTER — Encounter: Payer: Self-pay | Admitting: Physical Therapy

## 2024-08-17 DIAGNOSIS — R2681 Unsteadiness on feet: Secondary | ICD-10-CM

## 2024-08-17 DIAGNOSIS — M6281 Muscle weakness (generalized): Secondary | ICD-10-CM | POA: Diagnosis not present

## 2024-08-17 DIAGNOSIS — R262 Difficulty in walking, not elsewhere classified: Secondary | ICD-10-CM

## 2024-08-17 NOTE — Therapy (Signed)
 OUTPATIENT PHYSICAL THERAPY TREATMENT   Patient Name: Ariana Herrera MRN: [id number] DOB: 05-May-1966, 58 y.o., female Today's Date: 08/17/2024   END OF SESSION:  PT End of Session - 08/17/24 0936     Visit Number 6    Date for Recertification  10/22/24    Authorization Type [facility] Medicaid    Authorization Time Period Auth starting 08/25/24    PT Start Time 0936    PT Stop Time 1015    PT Time Calculation (min) 39 min    Activity Tolerance Patient tolerated treatment well    Behavior During Therapy [facility] for tasks assessed/performed               Past Medical History:  Diagnosis Date   Anginal pain    Anxiety    Arthritis    Asthma    Bipolar 1 disorder (HCC)    Bipolar disease, chronic (HCC)    Breast cancer in female (HCC)    2019 and recurrent in 2023 to right breast, right axillary, and left neck   Coronary artery disease    Deep vein blood clot of right lower extremity (HCC)    Diabetes mellitus type 2 in obese    Goals of care, counseling/discussion 11/23/2021   History of external beam radiation therapy    Left neck-10/07/23-11/24/23-[facility]   Hyperlipidemia    Lymphedema    MI (myocardial infarction) (HCC)    was in [place]    Obesity    Pancreatitis    Personal history of chemotherapy    Personal history of radiation therapy    Sleep apnea    Past Surgical History:  Procedure Laterality Date   BREAST BIOPSY Left 10/03/2023   times 2   BREAST LUMPECTOMY Left 03/2019   CATARACT EXTRACTION Bilateral    CHOLECYSTECTOMY     IR IMAGING GUIDED PORT INSERTION  12/15/2021   IR PATIENT EVAL TECH 0-60 MINS  10/06/2023   IR REMOVAL TUN ACCESS W/ PORT W/O FL MOD SED  12/20/2019   IR US  GUIDE BX ASP/DRAIN  11/02/2021   LEFT HEART CATH AND CORONARY ANGIOGRAPHY N/A 08/22/2020   Procedure: LEFT HEART CATH AND CORONARY ANGIOGRAPHY;  Surgeon: Verlin Lonni BIRCH, MD;  Location: [facility] INVASIVE CV LAB;  Service: Cardiovascular;  Laterality: N/A;    TRIGGER FINGER RELEASE Bilateral    x 5   TUBAL LIGATION     UMBILICAL HERNIA REPAIR     x 2   uterine ablation     Patient Active Problem List   Diagnosis Date Noted   Coronary artery disease involving native coronary artery of native heart without angina pectoris 08/16/2024   Internal jugular (IJ) vein thromboembolism, acute (HCC) 10/06/2023   Facial swelling 10/05/2023   Tobacco use 07/14/2023   Type 2 diabetes mellitus with hyperglycemia, with long-term current use of insulin  (HCC) 06/23/2023   Stress incontinence of urine 06/23/2023   Moderate persistent asthma without complication 06/23/2023   Obesity (BMI 30-39.9) 06/23/2023   Encounter for orthopedic follow-up care 09/23/2021   Acquired trigger finger of left middle finger 08/27/2021   Chest pain of uncertain etiology    History of MI (myocardial infarction) 08/20/2020   History of pancreatitis 08/20/2020   Hyperlipidemia associated with type 2 diabetes mellitus (HCC) 08/20/2020   Bipolar disease, chronic (HCC)    Breast cancer (HCC) 11/26/2019   Chemotherapy-induced neuropathy 11/26/2019   History of DVT (deep vein thrombosis) 11/26/2019    PCP: Cyndi Shaver, PA-C (Inactive)   REFERRING  PROVIDER: Timmy Maude SAUNDERS, MD   REFERRING DIAG: [phone] (ICD-10-CM) - Malignant neoplasm involving both nipple and areola of left breast in female, unspecified estrogen receptor status (HCC)   Per MD order: She is a bit deconditioned. She has lost weight. She does have breast cancer. I think she just needs some strengthening exercises for her legs.   THERAPY DIAG:  Muscle weakness (generalized)  Difficulty in walking, not elsewhere classified  Unsteadiness on feet  RATIONALE FOR EVALUATION AND TREATMENT: Rehabilitation  ONSET DATE: ~Dec 2024  NEXT MD VISIT: 08/24/24   SUBJECTIVE:                                                                                                                                                                                                          SUBJECTIVE STATEMENT: Pt feels that she is doing well with PT thus far - walking endurance is better.  EVAL: Pt reports her cancer returned in Dec 2024 and ever since she has been losing ~10# per month and she feels like her muscles have gone with the weight.  Now has difficulty with housework and working the yard or garden.  Unable to go shopping w/o using an electric cart. Also reports nerve pain at times.  She is in remission but does have terminal metastatic breast cancer, currently taking oral chemo - 3 weeks on/1 week off.  She states her blood counts are currently low.  PAIN: Are you having pain? No and Yes: NPRS scale: 0/10  Pain location: L knee   PERTINENT HISTORY:  Angina; anxiety; arthritis - knee, hips, back and neck (4 bulging discs in neck); asthma; bipolar 1; breast cancer 2019 and recurrent 2023 to right breast, right axillary, and left neck; R LE DVT; DM-II; HLD; MI; pancreatitis; dizziness  PRECAUTIONS: None  RED FLAGS: None  WEIGHT BEARING RESTRICTIONS: No  FALLS:  Has patient fallen in last 6 months? No  LIVING ENVIRONMENT: Lives with: lives with their spouse and lives with their son Lives in: Mobile home - 5th wheel Stairs: Yes: External: 3 steps; on left going up Has following equipment at home: Single point cane, Environmental consultant - 2 wheeled, Wheelchair (manual), and shower chair  OCCUPATION: On disability  PLOF: Independent with basic ADLs, Needs assistance with homemaking, and Leisure: gardening, travel - unable to participate in leisure activities right now  PATIENT GOALS: Be able to stand longer periods of time - get strength back in my legs. Walk around easier.   OBJECTIVE: (objective measures completed at initial evaluation unless otherwise dated)  DIAGNOSTIC  FINDINGS:  06/11/24 - CT ANGIOGRAPHY CHEST WITH CONTRAST IMPRESSION: 1. Tiny subsegmental pulmonary embolism in a posterior left lower lobe  pulmonary artery. No evidence of right heart strain. 2. Small pericardial effusion. 3. Postsurgical changes in the left breast with diffuse dermal and trabecular thickening similar to prior. Findings in the neck reported separately. 4. Unchanged vague nodularity in the left supraclavicular region. 5. Aortic Atherosclerosis (ICD10-I70.0).   06/03/24 - MR NECK WITH AND WITHOUT INTRAVENOUS CONTRAST IMPRESSION: 1. No acute findings within the soft tissues of the neck. 2. Mild to moderate degenerative changes in the cervical spine as described above. No abnormal enhancement of the spinal cord or nerve roots. 3. Interval resolution of left supraclavicular lymphadenopathy and soft tissue stranding.  03/02/24 - MRI HEAD WITHOUT AND WITH CONTRAST IMPRESSION: No acute intracranial abnormality. No findings to suggest intracranial metastatic disease.   Punctate susceptibility in the right parietal white matter slightly more conspicuous on current study likely due to differences in technique. No associated edema or enhancement. Findings likely reflect chronic microhemorrhages. Recommend attention on follow-up.   Similar 7 mm dural-based enhancing lesion over the left middle frontal gyrus suggestive of a small meningioma.   Trace mastoid effusions.  PATIENT SURVEYS:  LEFS  Extreme difficulty/unable (0), Quite a bit of difficulty (1), Moderate difficulty (2), Little difficulty (3), No difficulty (4) Survey date:  07/30/24  Any of your usual work, housework or school activities 1  2. Usual hobbies, recreational or sporting activities 0  3. Getting into/out of the bath 1  4. Walking between rooms 2  5. Putting on socks/shoes 4  6. Squatting  0  7. Lifting an object, like a bag of groceries from the floor 2  8. Performing light activities around your home 1  9. Performing heavy activities around your home 0  10. Getting into/out of a car 4  11. Walking 2 blocks 0  12. Walking 1 mile 0  13.  Going up/down 10 stairs (1 flight) 1  14. Standing for 1 hour 3  15.  sitting for 1 hour 0  16. Running on even ground 0  17. Running on uneven ground 0  18. Making sharp turns while running fast 0  19. Hopping  1  20. Rolling over in bed 2  Score total:  22 / 80 = 27.5 %  Functional limitation:   moderate     COGNITION: Overall cognitive status: Impaired (Chemo brain)  SENSATION: Chemo-based neuropathy in B hands and feet; nerve pain in legs  EDEMA:  Fluctuating edema in feet  POSTURE:  No Significant postural limitations  MUSCLE LENGTH: Hamstrings: mild tight B Piriformis: mod/severe tight R, mild tight L  LOWER EXTREMITY ROM: Grossly WFL other than limitation in hip rotation R>L  LOWER EXTREMITY MMT:  MMT Right eval Left eval  Hip flexion 4- 4-  Hip extension 3+ 4-  Hip abduction 3+ 3+  Hip adduction 3- 3-  Hip internal rotation 4 4  Hip external rotation 4- 4-  Knee flexion 4- 4-  Knee extension 4 4  Ankle dorsiflexion 4 4-  Ankle plantarflexion 3 (4 SLS HR) 3 (4 SLS HR)  Ankle inversion    Ankle eversion     (Blank rows = not tested)  FUNCTIONAL TESTS:  5 times sit to stand: 30.87 sec (several tries/attempts necessary on some reps) Timed up and go (TUG): 13.84 sec 10 meter walk test: 11.32 sec  Gait speed: 2.90 ft/sec Berg Balance Scale: 08/01/24 - 35/56; <  36 high risk for falls (close to 100%) Functional gait assessment: 08/01/24 - 7/30; < 19 = high risk fall  Interpretation of scores: Non-Specific Older Adults Cutoff Score: <=22/30 = risk of falls Parkinson's Disease Cutoff score <15/30= fall risk (Hoehn & Yahr 1-4)  Minimally Clinically Important Difference (MCID)  Stroke (acute, subacute, and chronic) = MDC: 4.2 points Vestibular (acute) = MDC: 6 points Community Dwelling Older Adults =  MCID: 4 points Parkinson's Disease  =  MDC: 4.3 points  (Academy of Neurologic Physical Therapy (nd). Functional Gait Assessment. Retrieved from  https://www.neuropt.org/docs/default-source/cpgs/core-outcome-measures/function-gait-assessment-pocket-guide-proof9-(2).pdf?sfvrsn=b39f35043_0.)  GAIT: Distance walked: clinic distances Assistive device utilized: None Level of assistance: Complete Independence and SBA Gait pattern: decreased gait velocity, decreased stride length, lateral hip instability, and wide BOS   TODAY'S TREATMENT:   08/17/2024  THERAPEUTIC EXERCISE: To improve strength and endurance.  Demonstration, verbal and tactile cues throughout for technique.  NuStep - L3 x 6 min  Seated LAQ with looped YTB at ankles 2 x 10  THERAPEUTIC ACTIVITIES: To improve strength and functional performance.  Demonstration, verbal and tactile cues throughout for technique. Standing hip ABD with looped YTB at ankles x 10 bil Standing hip extension with looped YTB at ankles x 10 bil Standing hip flexion march with looped YTB at midfeet x 10 bil Standing heel toe raises x 10 *All standing exercises with UE support on wall ladder for balance.  NEUROMUSCULAR RE-EDUCATION: To improve balance, coordination, kinesthesia, proprioception, and reduce fall risk. Standing on Airex pad: Weight shifting Marching in place Toe clears to 9 stool   08/15/24 Nustep L3x8min UE/LE Supine DKTC orange ball x 20 Leg curls 10lb x 10 BLE- lightning bolt feeling in L calf Leg ext 5lb 2x10 BLE Seated hip ABD RTB x 10 Seated marching RTB x 10  NEUROMUSCULAR RE-EDUCATION: 5xSTS- 17.37 seconds Standing on airex:  Narrow BOS balance x 1 min  EC x 1 min  Normal BOS w/ head turns x5; trunk rotations x 5  Heel/toe raises x 10 B Bridges 2x10 orange pball   08/10/24 NEUROMUSCULAR RE-EDUCATION: Tandem gait along counter 2x down/back intermittent support Standing on airex:  Normal BOS balance x 1 min- wobbly  Narrow BOS balance x 1 min- wobbly  EC x 1 min  Normal BOS w/ head turns x5; trunk rotations x 5  THERAPEUTIC EXERCISE:  UBE L1.0 3 min  fwd/back Bridges with PPT 2x10 Supine feet on orange pball:  DKTC x 10  Trunk rotation x 10 B  Iso HS curl + PPT x 10 B  THERAPEUTIC ACTIVITIES:  Standing hip abduction x 10 BLE Standing hip extension x 10 BLE Standing marching x 10 BLE Standing heel/toe raises x 10 BLE Sit to stands 2x5   08/08/24 NEUROMUSCULAR RE-EDUCATION: To improve coordination, kinesthesia, posture, and proprioception.  Standing heel/toe raises x 10 B- needed to sit after each exercise reporting dizziness for ~2-3 seconds Seated ab sets with beach ball 2x10 Seated B Shoulder ER and hip ER 2x10 Tandem stance x 30 BLE Clock balance R/L 12 to 6 o'clock 1x around Supine PPT 2x10 Supine marches with PPT x 10 B Bridges x 10  THERAPEUTIC EXERCISE: To improve strength, endurance, ROM, and flexibility Nustep L2x40min Brief HEP review LTR both ways x 10 Supine SLR x 10 BLE Supine SKTC stretch x 30 R/L Supine figure 4 stretch x 30 R/L   08/01/2024  PHYSICAL PERFORMANCE TEST or MEASUREMENT:  Berg Balance Test   Sit to Stand Able to stand  independently using  hands    Standing Unsupported Able to stand 30 seconds unsupported   had to stop due to feeling dizzy   Sitting with Back Unsupported but Feet Supported on Floor or Stool Able to sit safely and securely 2 minutes    Stand to Sit Uses backs of legs against chair to control descent    Transfers Able to transfer safely, definite need of hands    Standing Unsupported with Eyes Closed Able to stand 10 seconds with supervision    Standing Unsupported with Feet Together Able to place feet together independently but unable to hold for 30 seconds    From Standing, Reach Forward with Outstretched Arm Can reach confidently >25 cm (10)    From Standing Position, Pick up Object from Floor Able to pick up shoe, needs supervision    From Standing Position, Turn to Look Behind Over each Shoulder Turn sideways only but maintains balance    Turn 360 Degrees Needs close  supervision or verbal cueing    Standing Unsupported, Alternately Place Feet on Step/Stool Able to stand independently and complete 8 steps >20 seconds    Standing Unsupported, One Foot in Front Needs help to step but can hold 15 seconds    Standing on One Leg Able to lift leg independently and hold equal to or more than 3 seconds    Total Score 35    Berg comment: < 36 high risk for falls (close to 100%)      Functional Gait  Assessment   Gait Level Surface Walks 20 ft, slow speed, abnormal gait pattern, evidence for imbalance or deviates 10-15 in outside of the 12 in walkway width. Requires more than 7 sec to ambulate 20 ft.    Change in Gait Speed Makes only minor adjustments to walking speed, or accomplishes a change in speed with significant gait deviations, deviates 10-15 in outside the 12 in walkway width, or changes speed but loses balance but is able to recover and continue walking.    Gait with Horizontal Head Turns Performs head turns with moderate changes in gait velocity, slows down, deviates 10-15 in outside 12 in walkway width but recovers, can continue to walk.    Gait with Vertical Head Turns Performs task with moderate change in gait velocity, slows down, deviates 10-15 in outside 12 in walkway width but recovers, can continue to walk.    Gait and Pivot Turn Turns slowly, requires verbal cueing, or requires several small steps to catch balance following turn and stop    Step Over Obstacle Is able to step over one shoe box (4.5 in total height) but must slow down and adjust steps to clear box safely. May require verbal cueing.    Gait with Narrow Base of Support Ambulates less than 4 steps heel to toe or cannot perform without assistance.    Gait with Eyes Closed Cannot walk 20 ft without assistance, severe gait deviations or imbalance, deviates greater than 15 in outside 12 in walkway width or will not attempt task.    Ambulating Backwards Walks 20 ft, slow speed, abnormal gait  pattern, evidence for imbalance, deviates 10-15 in outside 12 in walkway width.    Steps Cannot do safely.    Total Score 7    FGA comment: < 19 = high risk fall       THERAPEUTIC EXERCISE: To improve strength and endurance.  Demonstration, verbal and tactile cues throughout for technique.  Seated heel toe raises x 10 Seated hip ADD  isometrics with ball 10 x 5 Seated LAQ x 10 Seated YTB hip flexion march x 10 Seated B hip ABD with looped YTB at distal thighs x 10 - better tolerated isolating 1 leg at a time   07/30/2024  SELF CARE:  Reviewed eval findings and role of PT in addressing identified deficits as well as instruction in initial HEP (see below).    PATIENT EDUCATION:  Education details: HEP progression - YTB resistance added to standing exercises and seated LAQ  Person educated: Patient Education method: Explanation, Demonstration, Verbal cues, and Handouts Education comprehension: verbalized understanding, returned demonstration, verbal cues required, and needs further education  HOME EXERCISE PROGRAM: Access Code: [email or web address] URL: [email or web address] Date: 08/17/2024 Prepared by: Elijah Hidden  Exercises - Seated March with Resistance  - 1 x daily - 5-7 x weekly - 1-2 sets - 8-10 reps - 3 sec hold - Seated Hip Abduction with Resistance  - 1 x daily - 5-7 x weekly - 1-2 sets - 8-10 reps - 3-5 sec hold - Seated Hip Adduction Isometrics with Ball  - 1 x daily - 5-7 x weekly - 1-2 sets - 8-10 reps - 3-5 sec hold - Standing Hip Abduction with Resistance at Ankles and Counter Support  - 1 x daily - 3-4 x weekly - 2 sets - 10 reps - 3 sec hold - Standing Hip Extension with Resistance at Ankles and Counter Support  - 1 x daily - 3-4 x weekly - 2 sets - 10 reps - 3 sec hold - Marching with Resistance  - 1 x daily - 3-4 x weekly - 2 sets - 10 reps - 3 sec hold - Seated Knee Extension with Resistance  - 1 x daily - 3-4 x weekly - 2 sets - 10 reps - 3 sec hold - Heel  Toe Raises with Counter Support  - 1 x daily - 3-4 x weekly - 2 sets - 10 reps - 3 sec hold   ASSESSMENT:  CLINICAL IMPRESSION: Ariana Herrera reports improving walking tolerance/endurance.  Initial HEP going well along with recent progression - STG #1 met.  She was able to tolerate further progression today with addition of YTB to LAQ and standing hip exercises, but increased fatigue requiring only 1 set and increased seated rest breaks.  She was able to perform balance activities on Airex pad for unstable surface without need for UE assist/support but some continued unsteadiness noted.  Ariana Herrera will benefit from continued skilled PT to address ongoing strength and balance deficits to improve mobility and activity tolerance with decreased pain interference and decreased risk for falls.   EVAL: Ariana Herrera is a 58 y.o. female who was referred to physical therapy for evaluation and treatment for LE weakness and deconditioning 2 metastatic breast cancer.  She reports she experienced a recurrence of her cancer in December 2024 and ever since she has been losing ~10# per month and she feels like some of the weight loss has been related to muscle mass loss leading to generalized weakness and poor endurance with ADLs, daily mobility and household tasks.  Her energy levels are also impacted by low blood counts and she also experiences chemo-induced peripheral neuropathy as well as intermittent nerve pain.  Patient has deficits in hip ROM, B LE flexibility, B LE strength, and impaired balance and activity tolerance which are interfering with ADLs and are impacting quality of life.  On LEFS patient scored 22/80 demonstrating moderate functional limitation.  Ariana Herrera will benefit from  skilled PT to address above deficits to improve mobility and activity tolerance with decreased pain interference.  OBJECTIVE IMPAIRMENTS: Abnormal gait, cardiopulmonary status limiting activity, decreased activity  tolerance, decreased endurance, decreased knowledge of use of DME, decreased mobility, difficulty walking, decreased strength, decreased safety awareness, dizziness, impaired flexibility, impaired sensation, postural dysfunction, and pain.   ACTIVITY LIMITATIONS: carrying, lifting, bending, sitting, standing, squatting, stairs, transfers, bed mobility, bathing, dressing, hygiene/grooming, and locomotion level  PARTICIPATION LIMITATIONS: meal prep, cleaning, laundry, driving, shopping, community activity, and yard work  PERSONAL FACTORS: Fitness, Past/current experiences, Time since onset of injury/illness/exacerbation, and 3+ comorbidities: Angina; anxiety; arthritis - knee, hips, back and neck (4 bulging discs in neck); asthma; bipolar 1; breast cancer 2019 and recurrent 2023 to right breast, right axillary, and left neck; R LE DVT; DM-II; HLD; MI; pancreatitis; dizziness are also affecting patient's functional outcome.   REHAB POTENTIAL: Good  CLINICAL DECISION MAKING: Unstable/unpredictable  EVALUATION COMPLEXITY: High   GOALS: Goals reviewed with patient? Yes  SHORT TERM GOALS: Target date: 09/10/2024  Patient will be independent with initial HEP. Baseline: Initial HEP provided on eval 08/01/24 - reviewed initial HEP Goal status: MET - 08/17/24  2.  Complete standardized balance testing and update LTG's accordingly. Baseline: Berg & FGA pending Goal status: MET - 08/01/24  3.  Patient will improve 5x STS time to </= 24 seconds for improved efficiency and safety with transfers. Baseline: 30.87 sec Goal status: MET - 08/15/24 - 17.37 sec  LONG TERM GOALS: Target date: 10/22/2024  Patient will be independent with advanced/ongoing HEP to improve outcomes and carryover.  Baseline:  Goal status: INITIAL  2.  Patient will demonstrate improved B LE strength to >/= 4 to 4+/5 for improved stability and ease of mobility. Baseline: Refer to above LE MMT table Goal status: INITIAL  3.   Patient will be able to ambulate 600' w/o AD and normal gait pattern without increased pain to access community.  Baseline: very limited ambulation tolerance  Goal status: INITIAL  4. Patient will report >/= 35/80 on LEFS (MCID = 9 pts) to demonstrate improved functional ability. Baseline: 22 / 80 = 27.5 % Goal status: INITIAL  5.  Patient will demonstrate at least 19/30 on FGA to decrease risk of falls. Baseline: 08/01/24 - 7/30; < 19 = high risk fall  Goal status: INITIAL   6.  Patient will improve Berg score by at least 8 points to improve safety and stability with ADLs in standing and reduce risk for falls Baseline: 08/01/24 - 35/56; < 36 high risk for falls (close to 100%) Goal status: INITIAL   PLAN:  PT FREQUENCY: 2x/week  PT DURATION: 12 weeks  PLANNED INTERVENTIONS: [id number]- PT Re-evaluation, 97750- Physical Performance Testing, 97110-Therapeutic exercises, 97530- Therapeutic activity, 97112- Neuromuscular re-education, 97535- Self Care, [id number]- Manual therapy, [id number]- Gait training, [phone]- Aquatic Therapy, [phone]- Electrical stimulation (unattended), [id number]- Ultrasound, [id number]- Ionotophoresis 4mg /ml Dexamethasone , 79439 (1-2 muscles), 20561 (3+ muscles)- Dry Needling, Patient/Family education, Balance training, Stair training, Taping, Vestibular training, DME instructions, Cryotherapy, and Moist heat  PLAN FOR NEXT SESSION: Gently progress core/lumbopelvic and LE strengthening; balance activities as tolerated   Elijah CHRISTELLA Hidden, PT 08/17/2024, 10:17 AM

## 2024-08-21 ENCOUNTER — Other Ambulatory Visit (HOSPITAL_BASED_OUTPATIENT_CLINIC_OR_DEPARTMENT_OTHER): Payer: Self-pay

## 2024-08-21 ENCOUNTER — Ambulatory Visit: Attending: Cardiology | Admitting: Cardiology

## 2024-08-21 ENCOUNTER — Encounter: Payer: Self-pay | Admitting: Cardiology

## 2024-08-21 VITALS — BP 118/60 | HR 110 | Ht 62.0 in | Wt 143.4 lb

## 2024-08-21 DIAGNOSIS — E785 Hyperlipidemia, unspecified: Secondary | ICD-10-CM | POA: Diagnosis present

## 2024-08-21 DIAGNOSIS — R000 Tachycardia, unspecified: Secondary | ICD-10-CM | POA: Insufficient documentation

## 2024-08-21 DIAGNOSIS — E1169 Type 2 diabetes mellitus with other specified complication: Secondary | ICD-10-CM | POA: Insufficient documentation

## 2024-08-21 DIAGNOSIS — I951 Orthostatic hypotension: Secondary | ICD-10-CM | POA: Diagnosis not present

## 2024-08-21 DIAGNOSIS — I2510 Atherosclerotic heart disease of native coronary artery without angina pectoris: Secondary | ICD-10-CM | POA: Insufficient documentation

## 2024-08-21 DIAGNOSIS — Z720 Tobacco use: Secondary | ICD-10-CM | POA: Insufficient documentation

## 2024-08-21 MED ORDER — NITROGLYCERIN 0.4 MG SL SUBL
0.4000 mg | SUBLINGUAL_TABLET | SUBLINGUAL | 6 refills | Status: AC | PRN
  Filled 2024-08-21: qty 25, 10d supply, fill #0
  Filled 2024-09-02: qty 25, 7d supply, fill #0
  Filled 2024-09-04: qty 25, 30d supply, fill #0

## 2024-08-21 NOTE — Patient Instructions (Signed)
 Medication Instructions:  Your physician recommends that you continue on your current medications as directed. Please refer to the Current Medication list given to you today.  *If you need a refill on your cardiac medications before your next appointment, please call your pharmacy*  Follow-Up: At [facility], you and your health needs are our priority.  As part of our continuing mission to provide you with exceptional heart care, our providers are all part of one team.  This team includes your primary Cardiologist (physician) and Advanced Practice Providers or APPs (Physician Assistants and Nurse Practitioners) who all work together to provide you with the care you need, when you need it.  Your next appointment:   6 month(s)  Provider:   Vinie JAYSON Maxcy, MD  We recommend signing up for the patient portal called MyChart.  Sign up information is provided on this After Visit Summary.  MyChart is used to connect with patients for Virtual Visits (Telemedicine).  Patients are able to view lab/test results, encounter notes, upcoming appointments, etc.  Non-urgent messages can be sent to your provider as well.    To learn more about what you can do with MyChart, go to [email or web address].   Other Instructions

## 2024-08-22 ENCOUNTER — Ambulatory Visit

## 2024-08-22 DIAGNOSIS — M6281 Muscle weakness (generalized): Secondary | ICD-10-CM | POA: Diagnosis not present

## 2024-08-22 DIAGNOSIS — R262 Difficulty in walking, not elsewhere classified: Secondary | ICD-10-CM

## 2024-08-22 DIAGNOSIS — R2681 Unsteadiness on feet: Secondary | ICD-10-CM

## 2024-08-22 NOTE — Therapy (Signed)
 OUTPATIENT PHYSICAL THERAPY TREATMENT   Patient Name: YULISA CHIRICO MRN: [id number] DOB: [birth year], 58 y.o., female Today's Date: 08/22/2024   END OF SESSION:  PT End of Session - 08/22/24 1042     Visit Number 7    Date for Recertification  10/22/24    Authorization Type [facility] Medicaid    Authorization Time Period Auth starting 08/25/24    PT Start Time 1020    PT Stop Time 1104    PT Time Calculation (min) 44 min    Activity Tolerance Patient tolerated treatment well    Behavior During Therapy WFL for tasks assessed/performed                Past Medical History:  Diagnosis Date   Anginal pain    Anxiety    Arthritis    Asthma    Bipolar 1 disorder (HCC)    Bipolar disease, chronic (HCC)    Breast cancer in female (HCC)    2019 and recurrent in 2023 to right breast, right axillary, and left neck   Coronary artery disease    Deep vein blood clot of right lower extremity (HCC)    Diabetes mellitus type 2 in obese    Goals of care, counseling/discussion 11/23/2021   History of external beam radiation therapy    Left neck-10/07/23-11/24/23-[facility]   Hyperlipidemia    Lymphedema    MI (myocardial infarction) (HCC)    was in [place]    Obesity    Pancreatitis    Personal history of chemotherapy    Personal history of radiation therapy    Sleep apnea    Past Surgical History:  Procedure Laterality Date   BREAST BIOPSY Left 10/03/2023   times 2   BREAST LUMPECTOMY Left 03/2019   CATARACT EXTRACTION Bilateral    CHOLECYSTECTOMY     IR IMAGING GUIDED PORT INSERTION  12/15/2021   IR PATIENT EVAL TECH 0-60 MINS  10/06/2023   IR REMOVAL TUN ACCESS W/ PORT W/O FL MOD SED  12/20/2019   IR US  GUIDE BX ASP/DRAIN  11/02/2021   LEFT HEART CATH AND CORONARY ANGIOGRAPHY N/A 08/22/2020   Procedure: LEFT HEART CATH AND CORONARY ANGIOGRAPHY;  Surgeon: Verlin Lonni BIRCH, MD;  Location: [facility];  Service: Cardiovascular;  Laterality: N/A;    TRIGGER FINGER RELEASE Bilateral    x 5   TUBAL LIGATION     UMBILICAL HERNIA REPAIR     x 2   uterine ablation     Patient Active Problem List   Diagnosis Date Noted   Coronary artery disease involving native coronary artery of native heart without angina pectoris 08/16/2024   Internal jugular (IJ) vein thromboembolism, acute (HCC) 10/06/2023   Facial swelling 10/05/2023   Tobacco use 07/14/2023   Type 2 diabetes mellitus with hyperglycemia, with long-term current use of insulin  (HCC) 06/23/2023   Stress incontinence of urine 06/23/2023   Moderate persistent asthma without complication 06/23/2023   Obesity (BMI 30-39.9) 06/23/2023   Encounter for orthopedic follow-up care 09/23/2021   Acquired trigger finger of left middle finger 08/27/2021   Chest pain of uncertain etiology    History of MI (myocardial infarction) 08/20/2020   History of pancreatitis 08/20/2020   Hyperlipidemia associated with type 2 diabetes mellitus (HCC) 08/20/2020   Bipolar disease, chronic (HCC)    Breast cancer (HCC) 11/26/2019   Chemotherapy-induced neuropathy 11/26/2019   History of DVT (deep vein thrombosis) 11/26/2019    PCP: Cyndi Shaver, PA-C (Inactive)  REFERRING PROVIDER: Timmy Maude SAUNDERS, MD   REFERRING DIAG: [phone] (ICD-10-CM) - Malignant neoplasm involving both nipple and areola of left breast in female, unspecified estrogen receptor status (HCC)   Per MD order: She is a bit deconditioned. She has lost weight. She does have breast cancer. I think she just needs some strengthening exercises for her legs.   THERAPY DIAG:  Muscle weakness (generalized)  Difficulty in walking, not elsewhere classified  Unsteadiness on feet  RATIONALE FOR EVALUATION AND TREATMENT: Rehabilitation  ONSET DATE: ~Dec 2024  NEXT MD VISIT: 08/24/24   SUBJECTIVE:                                                                                                                                                                                                          SUBJECTIVE STATEMENT: Pt feels that she is doing well with PT thus far - walking endurance is better.  EVAL: Pt reports her cancer returned in Dec 2024 and ever since she has been losing ~10# per month and she feels like her muscles have gone with the weight.  Now has difficulty with housework and working the yard or garden.  Unable to go shopping w/o using an electric cart. Also reports nerve pain at times.  She is in remission but does have terminal metastatic breast cancer, currently taking oral chemo - 3 weeks on/1 week off.  She states her blood counts are currently low.  PAIN: Are you having pain? No and Yes: NPRS scale: 2/10  Pain location: lower legs  PERTINENT HISTORY:  Angina; anxiety; arthritis - knee, hips, back and neck (4 bulging discs in neck); asthma; bipolar 1; breast cancer 2019 and recurrent 2023 to right breast, right axillary, and left neck; R LE DVT; DM-II; HLD; MI; pancreatitis; dizziness  PRECAUTIONS: None  RED FLAGS: None  WEIGHT BEARING RESTRICTIONS: No  FALLS:  Has patient fallen in last 6 months? No  LIVING ENVIRONMENT: Lives with: lives with their spouse and lives with their son Lives in: Mobile home - 5th wheel Stairs: Yes: External: 3 steps; on left going up Has following equipment at home: Single point cane, Environmental Consultant - 2 wheeled, Wheelchair (manual), and shower chair  OCCUPATION: On disability  PLOF: Independent with basic ADLs, Needs assistance with homemaking, and Leisure: gardening, travel - unable to participate in leisure activities right now  PATIENT GOALS: Be able to stand longer periods of time - get strength back in my legs. Walk around easier.   OBJECTIVE: (objective measures completed at initial evaluation unless otherwise dated)  DIAGNOSTIC  FINDINGS:  06/11/24 - CT ANGIOGRAPHY CHEST WITH CONTRAST IMPRESSION: 1. Tiny subsegmental pulmonary embolism in a posterior left  lower lobe pulmonary artery. No evidence of right heart strain. 2. Small pericardial effusion. 3. Postsurgical changes in the left breast with diffuse dermal and trabecular thickening similar to prior. Findings in the neck reported separately. 4. Unchanged vague nodularity in the left supraclavicular region. 5. Aortic Atherosclerosis (ICD10-I70.0).   06/03/24 - MR NECK WITH AND WITHOUT INTRAVENOUS CONTRAST IMPRESSION: 1. No acute findings within the soft tissues of the neck. 2. Mild to moderate degenerative changes in the cervical spine as described above. No abnormal enhancement of the spinal cord or nerve roots. 3. Interval resolution of left supraclavicular lymphadenopathy and soft tissue stranding.  03/02/24 - MRI HEAD WITHOUT AND WITH CONTRAST IMPRESSION: No acute intracranial abnormality. No findings to suggest intracranial metastatic disease.   Punctate susceptibility in the right parietal white matter slightly more conspicuous on current study likely due to differences in technique. No associated edema or enhancement. Findings likely reflect chronic microhemorrhages. Recommend attention on follow-up.   Similar 7 mm dural-based enhancing lesion over the left middle frontal gyrus suggestive of a small meningioma.   Trace mastoid effusions.  PATIENT SURVEYS:  LEFS  Extreme difficulty/unable (0), Quite a bit of difficulty (1), Moderate difficulty (2), Little difficulty (3), No difficulty (4) Survey date:  07/30/24  Any of your usual work, housework or school activities 1  2. Usual hobbies, recreational or sporting activities 0  3. Getting into/out of the bath 1  4. Walking between rooms 2  5. Putting on socks/shoes 4  6. Squatting  0  7. Lifting an object, like a bag of groceries from the floor 2  8. Performing light activities around your home 1  9. Performing heavy activities around your home 0  10. Getting into/out of a car 4  11. Walking 2 blocks 0  12. Walking 1  mile 0  13. Going up/down 10 stairs (1 flight) 1  14. Standing for 1 hour 3  15.  sitting for 1 hour 0  16. Running on even ground 0  17. Running on uneven ground 0  18. Making sharp turns while running fast 0  19. Hopping  1  20. Rolling over in bed 2  Score total:  22 / 80 = 27.5 %  Functional limitation:   moderate     COGNITION: Overall cognitive status: Impaired (Chemo brain)  SENSATION: Chemo-based neuropathy in B hands and feet; nerve pain in legs  EDEMA:  Fluctuating edema in feet  POSTURE:  No Significant postural limitations  MUSCLE LENGTH: Hamstrings: mild tight B Piriformis: mod/severe tight R, mild tight L  LOWER EXTREMITY ROM: Grossly WFL other than limitation in hip rotation R>L  LOWER EXTREMITY MMT:  MMT Right eval Left eval R 08/22/24 L 08/22/24  Hip flexion 4- 4- 4+ 4+  Hip extension 3+ 4- 4 4  Hip abduction 3+ 3+ 4+ 4  Hip adduction 3- 3- 4 4  Hip internal rotation 4 4 4  4+  Hip external rotation 4- 4- 4+ 4  Knee flexion 4- 4- 4 4+  Knee extension 4 4 4+ 5  Ankle dorsiflexion 4 4- 4+ 4+  Ankle plantarflexion 3 (4 SLS HR) 3 (4 SLS HR) 9 HR 12 HR  Ankle inversion      Ankle eversion       (Blank rows = not tested)  FUNCTIONAL TESTS:  5 times sit to stand: 30.87 sec (several  tries/attempts necessary on some reps) Timed up and go (TUG): 13.84 sec 10 meter walk test: 11.32 sec  Gait speed: 2.90 ft/sec Berg Balance Scale: 08/01/24 - 35/56; < 36 high risk for falls (close to 100%) Functional gait assessment: 08/01/24 - 7/30; < 19 = high risk fall  Interpretation of scores: Non-Specific Older Adults Cutoff Score: <=22/30 = risk of falls Parkinson's Disease Cutoff score <15/30= fall risk (Hoehn & Yahr 1-4)  Minimally Clinically Important Difference (MCID)  Stroke (acute, subacute, and chronic) = MDC: 4.2 points Vestibular (acute) = MDC: 6 points Community Dwelling Older Adults =  MCID: 4 points Parkinson's Disease  =  MDC: 4.3  points  (Academy of Neurologic Physical Therapy (nd). Functional Gait Assessment. Retrieved from https://www.neuropt.org/docs/default-source/cpgs/core-outcome-measures/function-gait-assessment-pocket-guide-proof9-(2).pdf?sfvrsn=b8f35043_0.)  GAIT: Distance walked: clinic distances Assistive device utilized: None Level of assistance: Complete Independence and SBA Gait pattern: decreased gait velocity, decreased stride length, lateral hip instability, and wide BOS   TODAY'S TREATMENT:  08/22/2024  THERAPEUTIC EXERCISE: To improve strength and endurance.  Demonstration, verbal and tactile cues throughout for technique.  NuStep - L3 x 6 min  Tested LE strength  THERAPEUTIC ACTIVITIES: To improve strength and functional performance.  Demonstration, verbal and tactile cues throughout for technique. Standing hip ABD with looped YTB at ankles x 10 bil Standing hip extension with looped YTB at ankles x 10 bil Standing hip flexion march with looped YTB at midfeet x 10 bil Standing heel toe raises 2 x 10 Sidesteps YTB at ankles 4x10 along counter   *All standing exercises with UE support at counter for balance.  NEUROMUSCULAR RE-EDUCATION: To improve balance, coordination, kinesthesia, proprioception, and reduce fall risk. Standing on Airex pad: Weight shifting x 10 fwd/back Step to floor back to airex 2x5 BLE Toe clears to 9 stool 2x5 BLE; x 10 additional reps BLE  08/17/2024  THERAPEUTIC EXERCISE: To improve strength and endurance.  Demonstration, verbal and tactile cues throughout for technique.  NuStep - L3 x 6 min  Seated LAQ with looped YTB at ankles 2 x 10  THERAPEUTIC ACTIVITIES: To improve strength and functional performance.  Demonstration, verbal and tactile cues throughout for technique. Standing hip ABD with looped YTB at ankles x 10 bil Standing hip extension with looped YTB at ankles x 10 bil Standing hip flexion march with looped YTB at midfeet x 10 bil Standing heel  toe raises x 10 *All standing exercises with UE support on wall ladder for balance.  NEUROMUSCULAR RE-EDUCATION: To improve balance, coordination, kinesthesia, proprioception, and reduce fall risk. Standing on Airex pad: Weight shifting Marching in place Toe clears to 9 stool   08/15/24 Nustep L3x43min UE/LE Supine DKTC orange ball x 20 Leg curls 10lb x 10 BLE- lightning bolt feeling in L calf Leg ext 5lb 2x10 BLE Seated hip ABD RTB x 10 Seated marching RTB x 10  NEUROMUSCULAR RE-EDUCATION: 5xSTS- 17.37 seconds Standing on airex:  Narrow BOS balance x 1 min  EC x 1 min  Normal BOS w/ head turns x5; trunk rotations x 5  Heel/toe raises x 10 B Bridges 2x10 orange pball   08/10/24 NEUROMUSCULAR RE-EDUCATION: Tandem gait along counter 2x down/back intermittent support Standing on airex:  Normal BOS balance x 1 min- wobbly  Narrow BOS balance x 1 min- wobbly  EC x 1 min  Normal BOS w/ head turns x5; trunk rotations x 5  THERAPEUTIC EXERCISE:  UBE L1.0 3 min fwd/back Bridges with PPT 2x10 Supine feet on orange pball:  DKTC x 10  Trunk rotation x 10 B  Iso HS curl + PPT x 10 B  THERAPEUTIC ACTIVITIES:  Standing hip abduction x 10 BLE Standing hip extension x 10 BLE Standing marching x 10 BLE Standing heel/toe raises x 10 BLE Sit to stands 2x5   08/08/24 NEUROMUSCULAR RE-EDUCATION: To improve coordination, kinesthesia, posture, and proprioception.  Standing heel/toe raises x 10 B- needed to sit after each exercise reporting dizziness for ~2-3 seconds Seated ab sets with beach ball 2x10 Seated B Shoulder ER and hip ER 2x10 Tandem stance x 30 BLE Clock balance R/L 12 to 6 o'clock 1x around Supine PPT 2x10 Supine marches with PPT x 10 B Bridges x 10  THERAPEUTIC EXERCISE: To improve strength, endurance, ROM, and flexibility Nustep L2x46min Brief HEP review LTR both ways x 10 Supine SLR x 10 BLE Supine SKTC stretch x 30 R/L Supine figure 4 stretch x 30  R/L   08/01/2024  PHYSICAL PERFORMANCE TEST or MEASUREMENT:  Berg Balance Test   Sit to Stand Able to stand  independently using hands    Standing Unsupported Able to stand 30 seconds unsupported   had to stop due to feeling dizzy   Sitting with Back Unsupported but Feet Supported on Floor or Stool Able to sit safely and securely 2 minutes    Stand to Sit Uses backs of legs against chair to control descent    Transfers Able to transfer safely, definite need of hands    Standing Unsupported with Eyes Closed Able to stand 10 seconds with supervision    Standing Unsupported with Feet Together Able to place feet together independently but unable to hold for 30 seconds    From Standing, Reach Forward with Outstretched Arm Can reach confidently >25 cm (10)    From Standing Position, Pick up Object from Floor Able to pick up shoe, needs supervision    From Standing Position, Turn to Look Behind Over each Shoulder Turn sideways only but maintains balance    Turn 360 Degrees Needs close supervision or verbal cueing    Standing Unsupported, Alternately Place Feet on Step/Stool Able to stand independently and complete 8 steps >20 seconds    Standing Unsupported, One Foot in Front Needs help to step but can hold 15 seconds    Standing on One Leg Able to lift leg independently and hold equal to or more than 3 seconds    Total Score 35    Berg comment: < 36 high risk for falls (close to 100%)      Functional Gait  Assessment   Gait Level Surface Walks 20 ft, slow speed, abnormal gait pattern, evidence for imbalance or deviates 10-15 in outside of the 12 in walkway width. Requires more than 7 sec to ambulate 20 ft.    Change in Gait Speed Makes only minor adjustments to walking speed, or accomplishes a change in speed with significant gait deviations, deviates 10-15 in outside the 12 in walkway width, or changes speed but loses balance but is able to recover and continue walking.    Gait with Horizontal  Head Turns Performs head turns with moderate changes in gait velocity, slows down, deviates 10-15 in outside 12 in walkway width but recovers, can continue to walk.    Gait with Vertical Head Turns Performs task with moderate change in gait velocity, slows down, deviates 10-15 in outside 12 in walkway width but recovers, can continue to walk.    Gait and Pivot Turn Turns slowly, requires verbal cueing, or  requires several small steps to catch balance following turn and stop    Step Over Obstacle Is able to step over one shoe box (4.5 in total height) but must slow down and adjust steps to clear box safely. May require verbal cueing.    Gait with Narrow Base of Support Ambulates less than 4 steps heel to toe or cannot perform without assistance.    Gait with Eyes Closed Cannot walk 20 ft without assistance, severe gait deviations or imbalance, deviates greater than 15 in outside 12 in walkway width or will not attempt task.    Ambulating Backwards Walks 20 ft, slow speed, abnormal gait pattern, evidence for imbalance, deviates 10-15 in outside 12 in walkway width.    Steps Cannot do safely.    Total Score 7    FGA comment: < 19 = high risk fall       THERAPEUTIC EXERCISE: To improve strength and endurance.  Demonstration, verbal and tactile cues throughout for technique.  Seated heel toe raises x 10 Seated hip ADD isometrics with ball 10 x 5 Seated LAQ x 10 Seated YTB hip flexion march x 10 Seated B hip ABD with looped YTB at distal thighs x 10 - better tolerated isolating 1 leg at a time   07/30/2024  SELF CARE:  Reviewed eval findings and role of PT in addressing identified deficits as well as instruction in initial HEP (see below).    PATIENT EDUCATION:  Education details: HEP progression - YTB resistance added to standing exercises and seated LAQ  Person educated: Patient Education method: Explanation, Demonstration, Verbal cues, and Handouts Education comprehension: verbalized  understanding, returned demonstration, verbal cues required, and needs further education  HOME EXERCISE PROGRAM: Access Code: [email or web address] URL: [email or web address] Date: 08/17/2024 Prepared by: Elijah Hidden  Exercises - Seated March with Resistance  - 1 x daily - 5-7 x weekly - 1-2 sets - 8-10 reps - 3 sec hold - Seated Hip Abduction with Resistance  - 1 x daily - 5-7 x weekly - 1-2 sets - 8-10 reps - 3-5 sec hold - Seated Hip Adduction Isometrics with Ball  - 1 x daily - 5-7 x weekly - 1-2 sets - 8-10 reps - 3-5 sec hold - Standing Hip Abduction with Resistance at Ankles and Counter Support  - 1 x daily - 3-4 x weekly - 2 sets - 10 reps - 3 sec hold - Standing Hip Extension with Resistance at Ankles and Counter Support  - 1 x daily - 3-4 x weekly - 2 sets - 10 reps - 3 sec hold - Marching with Resistance  - 1 x daily - 3-4 x weekly - 2 sets - 10 reps - 3 sec hold - Seated Knee Extension with Resistance  - 1 x daily - 3-4 x weekly - 2 sets - 10 reps - 3 sec hold - Heel Toe Raises with Counter Support  - 1 x daily - 3-4 x weekly - 2 sets - 10 reps - 3 sec hold   ASSESSMENT:  CLINICAL IMPRESSION: Marvelyn Bouchillon shows improvement in LE strength bilateral. Continued with strengthening and balance activities with good response. More stability noted with airex pad exercises, would recommend dynamic movements next visit. Pt going OOT next week, so will not see her again until 11/13. Perry will benefit from continued skilled PT to address ongoing strength and balance deficits to improve mobility and activity tolerance with decreased pain interference and decreased risk for falls.   EVAL: Barnie MARLA Croak  is a 58 y.o. female who was referred to physical therapy for evaluation and treatment for LE weakness and deconditioning 2 metastatic breast cancer.  She reports she experienced a recurrence of her cancer in December 2024 and ever since she has been losing ~10# per month and she feels  like some of the weight loss has been related to muscle mass loss leading to generalized weakness and poor endurance with ADLs, daily mobility and household tasks.  Her energy levels are also impacted by low blood counts and she also experiences chemo-induced peripheral neuropathy as well as intermittent nerve pain.  Patient has deficits in hip ROM, B LE flexibility, B LE strength, and impaired balance and activity tolerance which are interfering with ADLs and are impacting quality of life.  On LEFS patient scored 22/80 demonstrating moderate functional limitation.  Verneice Caspers will benefit from skilled PT to address above deficits to improve mobility and activity tolerance with decreased pain interference.  OBJECTIVE IMPAIRMENTS: Abnormal gait, cardiopulmonary status limiting activity, decreased activity tolerance, decreased endurance, decreased knowledge of use of DME, decreased mobility, difficulty walking, decreased strength, decreased safety awareness, dizziness, impaired flexibility, impaired sensation, postural dysfunction, and pain.   ACTIVITY LIMITATIONS: carrying, lifting, bending, sitting, standing, squatting, stairs, transfers, bed mobility, bathing, dressing, hygiene/grooming, and locomotion level  PARTICIPATION LIMITATIONS: meal prep, cleaning, laundry, driving, shopping, community activity, and yard work  PERSONAL FACTORS: Fitness, Past/current experiences, Time since onset of injury/illness/exacerbation, and 3+ comorbidities: Angina; anxiety; arthritis - knee, hips, back and neck (4 bulging discs in neck); asthma; bipolar 1; breast cancer 2019 and recurrent 2023 to right breast, right axillary, and left neck; R LE DVT; DM-II; HLD; MI; pancreatitis; dizziness are also affecting patient's functional outcome.   REHAB POTENTIAL: Good  CLINICAL DECISION MAKING: Unstable/unpredictable  EVALUATION COMPLEXITY: High   GOALS: Goals reviewed with patient? Yes  SHORT TERM GOALS: Target  date: 09/10/2024  Patient will be independent with initial HEP. Baseline: Initial HEP provided on eval 08/01/24 - reviewed initial HEP Goal status: MET - 08/17/24  2.  Complete standardized balance testing and update LTG's accordingly. Baseline: Berg & FGA pending Goal status: MET - 08/01/24  3.  Patient will improve 5x STS time to </= 24 seconds for improved efficiency and safety with transfers. Baseline: 30.87 sec Goal status: MET - 08/15/24 - 17.37 sec  LONG TERM GOALS: Target date: 10/22/2024  Patient will be independent with advanced/ongoing HEP to improve outcomes and carryover.  Baseline:  Goal status: INITIAL  2.  Patient will demonstrate improved B LE strength to >/= 4 to 4+/5 for improved stability and ease of mobility. Baseline: Refer to above LE MMT table Goal status: MET- 08/22/24  3.  Patient will be able to ambulate 600' w/o AD and normal gait pattern without increased pain to access community.  Baseline: very limited ambulation tolerance  Goal status: INITIAL  4. Patient will report >/= 35/80 on LEFS (MCID = 9 pts) to demonstrate improved functional ability. Baseline: 22 / 80 = 27.5 % Goal status: INITIAL  5.  Patient will demonstrate at least 19/30 on FGA to decrease risk of falls. Baseline: 08/01/24 - 7/30; < 19 = high risk fall  Goal status: INITIAL   6.  Patient will improve Berg score by at least 8 points to improve safety and stability with ADLs in standing and reduce risk for falls Baseline: 08/01/24 - 35/56; < 36 high risk for falls (close to 100%) Goal status: INITIAL   PLAN:  PT FREQUENCY:  2x/week  PT DURATION: 12 weeks  PLANNED INTERVENTIONS: [id number]- PT Re-evaluation, 97750- Physical Performance Testing, 97110-Therapeutic exercises, 97530- Therapeutic activity, 97112- Neuromuscular re-education, 97535- Self Care, [id number]- Manual therapy, [phone]- Gait training, [phone]- Aquatic Therapy, [phone]- Electrical stimulation (unattended), 97035- Ultrasound,  [id number]- Ionotophoresis 4mg /ml Dexamethasone , [id number] (1-2 muscles), 20561 (3+ muscles)- Dry Needling, Patient/Family education, Balance training, Stair training, Taping, Vestibular training, DME instructions, Cryotherapy, and Moist heat  PLAN FOR NEXT SESSION: Gently progress core/lumbopelvic and LE strengthening; balance activities as tolerated   Raffaella Edison L Shaquan Puerta, PTA 08/22/2024, 11:09 AM

## 2024-08-24 ENCOUNTER — Encounter: Payer: Self-pay | Admitting: Hematology & Oncology

## 2024-08-24 ENCOUNTER — Inpatient Hospital Stay

## 2024-08-24 ENCOUNTER — Inpatient Hospital Stay: Admitting: Hematology & Oncology

## 2024-08-24 ENCOUNTER — Inpatient Hospital Stay: Attending: Hematology & Oncology

## 2024-08-24 VITALS — BP 108/64 | HR 100 | Temp 98.2°F | Resp 18 | Ht 62.0 in | Wt 141.0 lb

## 2024-08-24 DIAGNOSIS — Z7901 Long term (current) use of anticoagulants: Secondary | ICD-10-CM | POA: Insufficient documentation

## 2024-08-24 DIAGNOSIS — C50412 Malignant neoplasm of upper-outer quadrant of left female breast: Secondary | ICD-10-CM | POA: Insufficient documentation

## 2024-08-24 DIAGNOSIS — E538 Deficiency of other specified B group vitamins: Secondary | ICD-10-CM | POA: Insufficient documentation

## 2024-08-24 DIAGNOSIS — Z1731 Human epidermal growth factor receptor 2 positive status: Secondary | ICD-10-CM | POA: Insufficient documentation

## 2024-08-24 DIAGNOSIS — Z86718 Personal history of other venous thrombosis and embolism: Secondary | ICD-10-CM

## 2024-08-24 DIAGNOSIS — C50012 Malignant neoplasm of nipple and areola, left female breast: Secondary | ICD-10-CM

## 2024-08-24 DIAGNOSIS — D649 Anemia, unspecified: Secondary | ICD-10-CM | POA: Diagnosis not present

## 2024-08-24 DIAGNOSIS — C7989 Secondary malignant neoplasm of other specified sites: Secondary | ICD-10-CM | POA: Insufficient documentation

## 2024-08-24 LAB — CMP (CANCER CENTER ONLY)
ALT: 7 U/L (ref 0–44)
AST: 13 U/L — ABNORMAL LOW (ref 15–41)
Albumin: 4 g/dL (ref 3.5–5.0)
Alkaline Phosphatase: 75 U/L (ref 38–126)
Anion gap: 12 (ref 5–15)
BUN: 22 mg/dL — ABNORMAL HIGH (ref 6–20)
CO2: 23 mmol/L (ref 22–32)
Calcium: 9.6 mg/dL (ref 8.9–10.3)
Chloride: 102 mmol/L (ref 98–111)
Creatinine: 0.84 mg/dL (ref 0.44–1.00)
GFR, Estimated: >60 mL/min (ref >60)
Glucose, Bld: 230 mg/dL — ABNORMAL HIGH (ref 70–99)
Potassium: 4.2 mmol/L (ref 3.5–5.1)
Sodium: 137 mmol/L (ref 135–145)
Total Bilirubin: 0.2 mg/dL (ref 0.0–1.2)
Total Protein: 6.6 g/dL (ref 6.5–8.1)

## 2024-08-24 LAB — CBC WITH DIFFERENTIAL (CANCER CENTER ONLY)
Abs Immature Granulocytes: 0.02 K/uL (ref 0.00–0.07)
Basophils Absolute: 0 K/uL (ref 0.0–0.1)
Basophils Relative: 0 %
Eosinophils Absolute: 0 K/uL (ref 0.0–0.5)
Eosinophils Relative: 0 %
HCT: 29.6 % — ABNORMAL LOW (ref 36.0–46.0)
Hemoglobin: 9.9 g/dL — ABNORMAL LOW (ref 12.0–15.0)
Immature Granulocytes: 1 %
Lymphocytes Relative: 30 %
Lymphs Abs: 0.8 K/uL (ref 0.7–4.0)
MCH: 36.9 pg — ABNORMAL HIGH (ref 26.0–34.0)
MCHC: 33.4 g/dL (ref 30.0–36.0)
MCV: 110.4 fL — ABNORMAL HIGH (ref 80.0–100.0)
Monocytes Absolute: 0.3 K/uL (ref 0.1–1.0)
Monocytes Relative: 10 %
Neutro Abs: 1.6 K/uL — ABNORMAL LOW (ref 1.7–7.7)
Neutrophils Relative %: 59 %
Platelet Count: 116 K/uL — ABNORMAL LOW (ref 150–400)
RBC: 2.68 MIL/uL — ABNORMAL LOW (ref 3.87–5.11)
RDW: 15.3 % (ref 11.5–15.5)
WBC Count: 2.7 K/uL — ABNORMAL LOW (ref 4.0–10.5)
nRBC: 0 % (ref 0.0–0.2)

## 2024-08-24 LAB — SAMPLE TO BLOOD BANK

## 2024-08-24 LAB — IRON AND IRON BINDING CAPACITY (CC-WL,HP ONLY)
Iron: 40 ug/dL (ref 28–170)
Saturation Ratios: 12 % (ref 10.4–31.8)
TIBC: 322 ug/dL (ref 250–450)
UIBC: 282 ug/dL

## 2024-08-24 LAB — FERRITIN: Ferritin: 65 ng/mL (ref 11–307)

## 2024-08-24 NOTE — Patient Instructions (Signed)

## 2024-08-24 NOTE — Progress Notes (Addendum)
 Hematology and Oncology Follow Up Visit  Ariana Herrera [id number] Aug 28, 1966 58 y.o. 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 daily-start on 01/18/2024 Talazoparib 1 mg p.o. daily-start on 12/22/2023 -  changed to 0.50 mg po q day on 03/06/2024, now 3 weeks on/1 week off since 07/20/2024 B12 oral 1,000 mcg once daily    Interim History:  Ariana Herrera is here today for follow-up. She is doing well overall. She notes fatigue with PT but feels that her stamina is improving.  No adenopathy or lymphedema noted. CA 27.29 last month was stable at 30.4.  She states that she is tolerating the 3 weeks on and one week off of Talazoparib nicely.  She has occasional nausea and vomiting that comes and goes. She states that this is unchanged from baseline for her. She takes Zofran  as needed.  She has some mild SOB with exertion. This resolves with taking a break to rest.  No fever, chills, n/v, cough, rash, dizziness, chest pain, abdominal pain or changes in bowel or bladder habits.  GI has her on Linzess  for constipation.  No swelling in her extremities.  No falls or syncope reported.  No abnormal blood loss noted. She has had a few nose bleeds that she is able to stop without intervention. She will try using a humidifier and see if this improves.  No bruising or petechiae.  Appetite and hydration are good. Weight is stable at 141 lbs.   ECOG  Performance Status: 1 - Symptomatic but completely ambulatory  Medications:  Allergies as of 08/24/2024       Reactions   Lithium Other (See Comments)   Spinal fluid built up in brain   Dulaglutide  Nausea And Vomiting, Other (See Comments)   TRULICITY    Nitrofuran Derivatives Itching   Nitrofurantoin  Mono- MCR   Penicillins Other (See Comments)   UNKNOWN CHILDHOOD REACTION        Medication List        Accurate as of August 24, 2024  9:54 AM. If you have any questions, ask your nurse or doctor.          Accu-Chek Guide test strip Generic drug: glucose blood 3 (three) times daily.   albuterol  1.25 MG/3ML nebulizer solution Commonly known as: ACCUNEB  Take 1 ampule by nebulization every 6 (six) hours as needed for wheezing or shortness of breath.   albuterol  108 (90 Base) MCG/ACT inhaler Commonly known as: VENTOLIN  HFA Inhale 2 puffs into the lungs every 6 (six) hours as needed for wheezing or shortness of breath.   Dexcom G7 Sensor Misc Change sensor every 10 days   fenofibrate  145 MG tablet Commonly known as: TRICOR  Take 145 mg by mouth daily.   gabapentin  400 MG capsule Commonly known as: Neurontin  Take 1 capsule (400 mg total) by mouth 3 (three) times daily. What changed: when to take this   HYDROcodone -acetaminophen  7.5-325 MG tablet Commonly known as: NORCO  Take 1 tablet by mouth every 6 (six) hours as needed for moderate pain (pain score 4-6).   lidocaine -prilocaine  cream Commonly known as: EMLA  Apply 1 Application topically daily as needed.   Linzess  290 MCG Caps capsule Generic drug: linaclotide  TAKE 1 CAPSULE BY MOUTH DAILY BEFORE BREAKFAST.   lubiprostone  24 MCG capsule Commonly known as: AMITIZA  Take 1 capsule (24 mcg total) by mouth 2 (two) times daily with a meal.   metFORMIN  1000 MG tablet Commonly known as: GLUCOPHAGE  Take 1 tablet (1,000 mg total) by mouth in the morning and in the evening with meals.   nitroGLYCERIN  0.4 MG SL  tablet Commonly known as: NITROSTAT  Place 1 tablet (0.4 mg total) under the tongue every 5 (five) minutes as needed for chest pain.   NovoLOG  100 UNIT/ML injection Generic drug: insulin  aspart Inject into the skin as directed.   insulin  aspart 100 UNIT/ML injection Commonly known as: novoLOG  Use as directed via insulin  pump. Total daily dose 100 units.   OLANZapine  10 MG tablet Commonly known as: ZYPREXA  TAKE 1 TABLET BY MOUTH EVERYDAY AT BEDTIME   Omnipod 5 DexG7G6 Pods Gen 5 Misc Apply 1 pod every 3 days for insulin  administration.   ondansetron  8 MG disintegrating tablet Commonly known as: ZOFRAN -ODT Take 1 tablet (8 mg total) by mouth every 8 (eight) hours as needed for nausea or vomiting.   oxybutynin  5 MG 24 hr tablet Commonly known as: DITROPAN -XL TAKE 1 TABLET BY MOUTH EVERYDAY AT BEDTIME   pantoprazole  40 MG tablet Commonly known as: PROTONIX  Take 1 tablet (40 mg total) by mouth daily.   PEG 3350  17 g Pack Take 17 g by mouth daily as needed.   prochlorperazine  10 MG tablet Commonly known as: COMPAZINE  Take 1 tablet (10 mg total) by mouth every 6 (six) hours as needed for nausea or vomiting (Zofran  resumes 12/16).   QUEtiapine  400 MG tablet Commonly known as: SEROQUEL  Take 2 tablets (800 mg total) by mouth at bedtime.   rivaroxaban  10 MG Tabs tablet Commonly known as: XARELTO  Take 1 tablet (10 mg total) by mouth daily.   rosuvastatin  40 MG tablet Commonly known as: CRESTOR  Take 1 tablet (40 mg total) by mouth daily.   talazoparib tosylate  0.35 MG capsule Commonly known as: Talzenna  Take 1 capsule (0.35 mg total) by mouth daily. Take for 21 days on, then off for 7 days. Repeat every 28 days.        Allergies:  Allergies  Allergen Reactions   Lithium Other (See Comments)    Spinal fluid built up in brain   Dulaglutide  Nausea And Vomiting and Other (See Comments)    TRULICITY    Nitrofuran Derivatives Itching    Nitrofurantoin  Mono- MCR    Penicillins Other (See Comments)    UNKNOWN CHILDHOOD REACTION    Past Medical History, Surgical history, Social history, and Family History were reviewed and updated.  Review of Systems: All other 10 point review of systems is negative.   Physical Exam:  height is 5' 2 (1.575 m) and weight is 141 lb (64 kg). Her oral temperature is 98.2 F (36.8 C). Her blood pressure is 108/64 and her pulse is 100. Her respiration is 18 and oxygen saturation is 100%.   Wt Readings from Last 3 Encounters:  08/24/24 141 lb (64 kg)  08/21/24 143 lb 6.4 oz (65 kg)  08/16/24 144 lb (65.3 kg)    Ocular: Sclerae unicteric, pupils equal, round and reactive to light Ear-nose-throat: Oropharynx clear, dentition fair  Lymphatic: No cervical, supraclavicular or axillary adenopathy Lungs no rales or rhonchi, good excursion bilaterally Heart regular rate and rhythm, no murmur appreciated Abd soft, nontender, positive bowel sounds MSK no focal spinal tenderness, no joint edema Neuro: non-focal, well-oriented, appropriate affect Breasts: Deferred, no changes per patient   Lab Results  Component Value Date   WBC 2.7 (L) 08/24/2024   HGB 9.9 (L) 08/24/2024   HCT 29.6 (L) 08/24/2024   MCV 110.4 (H) 08/24/2024   PLT 116 (L) 08/24/2024   Lab Results  Component Value Date   FERRITIN 64 07/20/2024   IRON 48 07/20/2024   TIBC 298 07/20/2024   UIBC 250 07/20/2024   IRONPCTSAT 16 07/20/2024   Lab Results  Component Value Date   RETICCTPCT 3.2 (H) 07/20/2024   RBC 2.68 (L) 08/24/2024   No results found for: KPAFRELGTCHN, LAMBDASER, KAPLAMBRATIO No results found for: IGGSERUM, IGA, IGMSERUM No results found for: STEPHANY CARLOTA BENSON MARKEL EARLA JOANNIE DOC VICK, SPEI   Chemistry      Component Value Date/Time   NA 137 07/20/2024 1017   K 4.0 07/20/2024 1017   CL 103 07/20/2024 1017   CO2 25 07/20/2024 1017   BUN 15 07/20/2024 1017   CREATININE 0.71  07/20/2024 1017      Component Value Date/Time   CALCIUM  9.1 07/20/2024 1017   ALKPHOS 81 07/20/2024 1017   AST 12 (L) 07/20/2024 1017   ALT 9 07/20/2024 1017   BILITOT 0.3 07/20/2024 1017       Impression and Plan: Ariana Herrera is a very pleasant  58 yo caucasian female with recurrent ductal carcinoma of the left breast.  Recurrence was in the neck. She was treated with incredibly aggressive chemo radiation therapy.  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  She is now on dose reduced Talzenna  on 3 weeks and off 1 week and is back on her Xarelto .  CA 27.29 is pending.  No transfusion needed today. Hgb is improved at 9.9.  She will continue her same regimen with Talzenna . Follow-up in 1 month. We will look at repeat scans in December.   Lauraine Pepper, NP 10/31/20259:54 AM

## 2024-08-25 ENCOUNTER — Ambulatory Visit: Payer: Self-pay | Admitting: Hematology & Oncology

## 2024-08-25 LAB — CANCER ANTIGEN 27.29: CA 27.29: 52.9 U/mL — ABNORMAL HIGH (ref 0.0–38.6)

## 2024-08-27 ENCOUNTER — Other Ambulatory Visit: Payer: Self-pay | Admitting: Family

## 2024-08-27 ENCOUNTER — Ambulatory Visit

## 2024-08-27 DIAGNOSIS — D509 Iron deficiency anemia, unspecified: Secondary | ICD-10-CM | POA: Insufficient documentation

## 2024-08-31 ENCOUNTER — Other Ambulatory Visit (HOSPITAL_BASED_OUTPATIENT_CLINIC_OR_DEPARTMENT_OTHER): Payer: Self-pay

## 2024-09-03 ENCOUNTER — Other Ambulatory Visit (HOSPITAL_BASED_OUTPATIENT_CLINIC_OR_DEPARTMENT_OTHER): Payer: Self-pay

## 2024-09-04 ENCOUNTER — Other Ambulatory Visit (HOSPITAL_BASED_OUTPATIENT_CLINIC_OR_DEPARTMENT_OTHER): Payer: Self-pay

## 2024-09-05 ENCOUNTER — Other Ambulatory Visit: Payer: Self-pay | Admitting: Hematology & Oncology

## 2024-09-05 ENCOUNTER — Other Ambulatory Visit: Payer: Self-pay

## 2024-09-05 ENCOUNTER — Other Ambulatory Visit (HOSPITAL_BASED_OUTPATIENT_CLINIC_OR_DEPARTMENT_OTHER): Payer: Self-pay

## 2024-09-05 MED ORDER — HYDROCODONE-ACETAMINOPHEN 7.5-325 MG PO TABS
1.0000 | ORAL_TABLET | Freq: Four times a day (QID) | ORAL | 0 refills | Status: DC | PRN
  Filled 2024-09-05: qty 120, 30d supply, fill #0

## 2024-09-06 ENCOUNTER — Ambulatory Visit: Payer: Self-pay | Attending: Hematology & Oncology

## 2024-09-06 DIAGNOSIS — R262 Difficulty in walking, not elsewhere classified: Secondary | ICD-10-CM | POA: Insufficient documentation

## 2024-09-06 DIAGNOSIS — R2681 Unsteadiness on feet: Secondary | ICD-10-CM | POA: Insufficient documentation

## 2024-09-06 DIAGNOSIS — M6281 Muscle weakness (generalized): Secondary | ICD-10-CM | POA: Diagnosis present

## 2024-09-06 NOTE — Therapy (Signed)
 OUTPATIENT PHYSICAL THERAPY TREATMENT   Patient Name: Ariana Herrera MRN: [id number] DOB: 08/03/1966, 58 y.o., female Today's Date: 09/06/2024   END OF SESSION:  PT End of Session - 09/06/24 1107     Visit Number 8    Date for Recertification  10/22/24    Authorization Type [employer]    Authorization Time Period auth pending    PT Start Time 1019    PT Stop Time 1105    PT Time Calculation (min) 46 min    Activity Tolerance Patient tolerated treatment well    Behavior During Therapy WFL for tasks assessed/performed                 Past Medical History:  Diagnosis Date   Anginal pain    Anxiety    Arthritis    Asthma    Bipolar 1 disorder (HCC)    Bipolar disease, chronic (HCC)    Breast cancer in female (HCC)    2019 and recurrent in 2023 to right breast, right axillary, and left neck   Coronary artery disease    Deep vein blood clot of right lower extremity (HCC)    Diabetes mellitus type 2 in obese    Goals of care, counseling/discussion 11/23/2021   History of external beam radiation therapy    Left neck-10/07/23-11/24/23-[facility]   Hyperlipidemia    Lymphedema    MI (myocardial infarction) (HCC)    was in [place]    Obesity    Pancreatitis    Personal history of chemotherapy    Personal history of radiation therapy    Sleep apnea    Past Surgical History:  Procedure Laterality Date   BREAST BIOPSY Left 10/03/2023   times 2   BREAST LUMPECTOMY Left 03/2019   CATARACT EXTRACTION Bilateral    CHOLECYSTECTOMY     IR IMAGING GUIDED PORT INSERTION  12/15/2021   IR PATIENT EVAL TECH 0-60 MINS  10/06/2023   IR REMOVAL TUN ACCESS W/ PORT W/O FL MOD SED  12/20/2019   IR US  GUIDE BX ASP/DRAIN  11/02/2021   LEFT HEART CATH AND CORONARY ANGIOGRAPHY N/A 08/22/2020   Procedure: LEFT HEART CATH AND CORONARY ANGIOGRAPHY;  Surgeon: Verlin Lonni BIRCH, MD;  Location: [facility];  Service: Cardiovascular;  Laterality: N/A;   TRIGGER  FINGER RELEASE Bilateral    x 5   TUBAL LIGATION     UMBILICAL HERNIA REPAIR     x 2   uterine ablation     Patient Active Problem List   Diagnosis Date Noted   IDA (iron deficiency anemia) 08/27/2024   Coronary artery disease involving native coronary artery of native heart without angina pectoris 08/16/2024   Internal jugular (IJ) vein thromboembolism, acute (HCC) 10/06/2023   Facial swelling 10/05/2023   Tobacco use 07/14/2023   Type 2 diabetes mellitus with hyperglycemia, with long-term current use of insulin  (HCC) 06/23/2023   Stress incontinence of urine 06/23/2023   Moderate persistent asthma without complication 06/23/2023   Obesity (BMI 30-39.9) 06/23/2023   Encounter for orthopedic follow-up care 09/23/2021   Acquired trigger finger of left middle finger 08/27/2021   Chest pain of uncertain etiology    History of MI (myocardial infarction) 08/20/2020   History of pancreatitis 08/20/2020   Hyperlipidemia associated with type 2 diabetes mellitus (HCC) 08/20/2020   Bipolar disease, chronic (HCC)    Breast cancer (HCC) 11/26/2019   Chemotherapy-induced neuropathy 11/26/2019   History of DVT (deep vein thrombosis) 11/26/2019  PCP: Cyndi Shaver, PA-C (Inactive)   REFERRING PROVIDER: Timmy Maude SAUNDERS, MD   REFERRING DIAG: [id number] (ICD-10-CM) - Malignant neoplasm involving both nipple and areola of left breast in female, unspecified estrogen receptor status (HCC)   Per MD order: She is a bit deconditioned. She has lost weight. She does have breast cancer. I think she just needs some strengthening exercises for her legs.   THERAPY DIAG:  Muscle weakness (generalized)  Difficulty in walking, not elsewhere classified  Unsteadiness on feet  RATIONALE FOR EVALUATION AND TREATMENT: Rehabilitation  ONSET DATE: ~Dec 2024  NEXT MD VISIT: 08/24/24   SUBJECTIVE:                                                                                                                                                                                                          SUBJECTIVE STATEMENT: Did good on vacation, today reports a fall where she was sitting on a curb went to get up and fell back into grass, has some bruising that have pretty much healed now   EVAL: Pt reports her cancer returned in Dec 2024 and ever since she has been losing ~10# per month and she feels like her muscles have gone with the weight.  Now has difficulty with housework and working the yard or garden.  Unable to go shopping w/o using an electric cart. Also reports nerve pain at times.  She is in remission but does have terminal metastatic breast cancer, currently taking oral chemo - 3 weeks on/1 week off.  She states her blood counts are currently low.  PAIN: Are you having pain? No and Yes: NPRS scale: 3/10  Pain location: lower legs  PERTINENT HISTORY:  Angina; anxiety; arthritis - knee, hips, back and neck (4 bulging discs in neck); asthma; bipolar 1; breast cancer 2019 and recurrent 2023 to right breast, right axillary, and left neck; R LE DVT; DM-II; HLD; MI; pancreatitis; dizziness  PRECAUTIONS: None  RED FLAGS: None  WEIGHT BEARING RESTRICTIONS: No  FALLS:  Has patient fallen in last 6 months? No  LIVING ENVIRONMENT: Lives with: lives with their spouse and lives with their son Lives in: Mobile home - 5th wheel Stairs: Yes: External: 3 steps; on left going up Has following equipment at home: Single point cane, Environmental Consultant - 2 wheeled, Wheelchair (manual), and shower chair  OCCUPATION: On disability  PLOF: Independent with basic ADLs, Needs assistance with homemaking, and Leisure: gardening, travel - unable to participate in leisure activities right now  PATIENT GOALS: Be able to stand longer periods of  time - get strength back in my legs. Walk around easier.   OBJECTIVE: (objective measures completed at initial evaluation unless otherwise dated)  DIAGNOSTIC FINDINGS:   06/11/24 - CT ANGIOGRAPHY CHEST WITH CONTRAST IMPRESSION: 1. Tiny subsegmental pulmonary embolism in a posterior left lower lobe pulmonary artery. No evidence of right heart strain. 2. Small pericardial effusion. 3. Postsurgical changes in the left breast with diffuse dermal and trabecular thickening similar to prior. Findings in the neck reported separately. 4. Unchanged vague nodularity in the left supraclavicular region. 5. Aortic Atherosclerosis (ICD10-I70.0).   06/03/24 - MR NECK WITH AND WITHOUT INTRAVENOUS CONTRAST IMPRESSION: 1. No acute findings within the soft tissues of the neck. 2. Mild to moderate degenerative changes in the cervical spine as described above. No abnormal enhancement of the spinal cord or nerve roots. 3. Interval resolution of left supraclavicular lymphadenopathy and soft tissue stranding.  03/02/24 - MRI HEAD WITHOUT AND WITH CONTRAST IMPRESSION: No acute intracranial abnormality. No findings to suggest intracranial metastatic disease.   Punctate susceptibility in the right parietal white matter slightly more conspicuous on current study likely due to differences in technique. No associated edema or enhancement. Findings likely reflect chronic microhemorrhages. Recommend attention on follow-up.   Similar 7 mm dural-based enhancing lesion over the left middle frontal gyrus suggestive of a small meningioma.   Trace mastoid effusions.  PATIENT SURVEYS:  LEFS  Extreme difficulty/unable (0), Quite a bit of difficulty (1), Moderate difficulty (2), Little difficulty (3), No difficulty (4) Survey date:  07/30/24  Any of your usual work, housework or school activities 1  2. Usual hobbies, recreational or sporting activities 0  3. Getting into/out of the bath 1  4. Walking between rooms 2  5. Putting on socks/shoes 4  6. Squatting  0  7. Lifting an object, like a bag of groceries from the floor 2  8. Performing light activities around your home 1  9.  Performing heavy activities around your home 0  10. Getting into/out of a car 4  11. Walking 2 blocks 0  12. Walking 1 mile 0  13. Going up/down 10 stairs (1 flight) 1  14. Standing for 1 hour 3  15.  sitting for 1 hour 0  16. Running on even ground 0  17. Running on uneven ground 0  18. Making sharp turns while running fast 0  19. Hopping  1  20. Rolling over in bed 2  Score total:  22 / 80 = 27.5 %  Functional limitation:   moderate     COGNITION: Overall cognitive status: Impaired (Chemo brain)  SENSATION: Chemo-based neuropathy in B hands and feet; nerve pain in legs  EDEMA:  Fluctuating edema in feet  POSTURE:  No Significant postural limitations  MUSCLE LENGTH: Hamstrings: mild tight B Piriformis: mod/severe tight R, mild tight L  LOWER EXTREMITY ROM: Grossly WFL other than limitation in hip rotation R>L  LOWER EXTREMITY MMT:  MMT Right eval Left eval R 08/22/24 L 08/22/24  Hip flexion 4- 4- 4+ 4+  Hip extension 3+ 4- 4 4  Hip abduction 3+ 3+ 4+ 4  Hip adduction 3- 3- 4 4  Hip internal rotation 4 4 4  4+  Hip external rotation 4- 4- 4+ 4  Knee flexion 4- 4- 4 4+  Knee extension 4 4 4+ 5  Ankle dorsiflexion 4 4- 4+ 4+  Ankle plantarflexion 3 (4 SLS HR) 3 (4 SLS HR) 9 HR 12 HR  Ankle inversion  Ankle eversion       (Blank rows = not tested)  FUNCTIONAL TESTS:  5 times sit to stand: 30.87 sec (several tries/attempts necessary on some reps) Timed up and go (TUG): 13.84 sec 10 meter walk test: 11.32 sec  Gait speed: 2.90 ft/sec Berg Balance Scale: 08/01/24 - 35/56; < 36 high risk for falls (close to 100%) Functional gait assessment: 08/01/24 - 7/30; < 19 = high risk fall  Interpretation of scores: Non-Specific Older Adults Cutoff Score: <=22/30 = risk of falls Parkinson's Disease Cutoff score <15/30= fall risk (Hoehn & Yahr 1-4)  Minimally Clinically Important Difference (MCID)  Stroke (acute, subacute, and chronic) = MDC: 4.2 points Vestibular  (acute) = MDC: 6 points Community Dwelling Older Adults =  MCID: 4 points Parkinson's Disease  =  MDC: 4.3 points  (Academy of Neurologic Physical Therapy (nd). Functional Gait Assessment. Retrieved from https://www.neuropt.org/docs/default-source/cpgs/core-outcome-measures/function-gait-assessment-pocket-guide-proof9-(2).pdf?sfvrsn=b43f35043_0.)  GAIT: Distance walked: clinic distances Assistive device utilized: None Level of assistance: Complete Independence and SBA Gait pattern: decreased gait velocity, decreased stride length, lateral hip instability, and wide BOS   TODAY'S TREATMENT:  09/06/2024  THERAPEUTIC EXERCISE: To improve strength and endurance.  Demonstration, verbal and tactile cues throughout for technique.  NuStep - L3 x 6 min    NEUROMUSCULAR RE-EDUCATION: To improve balance, coordination, kinesthesia, proprioception, and reduce fall risk.  [facility] PT Assessment - 09/06/24 0001       Berg Balance Test   Sit to Stand Able to stand without using hands and stabilize independently    Standing Unsupported Able to stand safely 2 minutes    Sitting with Back Unsupported but Feet Supported on Floor or Stool Able to sit safely and securely 2 minutes    Stand to Sit Sits safely with minimal use of hands    Transfers Able to transfer safely, minor use of hands    Standing Unsupported with Eyes Closed Able to stand 10 seconds safely    Standing Unsupported with Feet Together Able to place feet together independently and stand 1 minute safely    From Standing, Reach Forward with Outstretched Arm Can reach confidently >25 cm (10)    From Standing Position, Pick up Object from Floor Able to pick up shoe safely and easily    From Standing Position, Turn to Look Behind Over each Shoulder Looks behind from both sides and weight shifts well    Turn 360 Degrees Able to turn 360 degrees safely in 4 seconds or less    Standing Unsupported, Alternately Place Feet on Step/Stool Able to stand  independently and safely and complete 8 steps in 20 seconds    Standing Unsupported, One Foot in Front Needs help to step but can hold 15 seconds    Standing on One Leg Tries to lift leg/unable to hold 3 seconds but remains standing independently    Total Score 50      Functional Gait  Assessment   Gait Level Surface Walks 20 ft, slow speed, abnormal gait pattern, evidence for imbalance or deviates 10-15 in outside of the 12 in walkway width. Requires more than 7 sec to ambulate 20 ft.    Change in Gait Speed Able to smoothly change walking speed without loss of balance or gait deviation. Deviate no more than 6 in outside of the 12 in walkway width.    Gait with Horizontal Head Turns Performs head turns with moderate changes in gait velocity, slows down, deviates 10-15 in outside 12 in walkway width but recovers, can continue  to walk.    Gait with Vertical Head Turns Performs task with slight change in gait velocity (eg, minor disruption to smooth gait path), deviates 6 - 10 in outside 12 in walkway width or uses assistive device    Gait and Pivot Turn Pivot turns safely within 3 sec and stops quickly with no loss of balance.    Step Over Obstacle Is able to step over 2 stacked shoe boxes taped together (9 in total height) without changing gait speed. No evidence of imbalance.    Gait with Narrow Base of Support Ambulates less than 4 steps heel to toe or cannot perform without assistance.    Gait with Eyes Closed Walks 20 ft, slow speed, abnormal gait pattern, evidence for imbalance, deviates 10-15 in outside 12 in walkway width. Requires more than 9 sec to ambulate 20 ft.    Ambulating Backwards Walks 20 ft, slow speed, abnormal gait pattern, evidence for imbalance, deviates 10-15 in outside 12 in walkway width.    Steps Two feet to a stair, must use rail.    Total Score 16        *All standing exercises with UE support at counter for balance. Tandem gait to end; retro gait to other end 3x  each Gait with head turns horiz 2x, vertical 2x  08/22/2024  THERAPEUTIC EXERCISE: To improve strength and endurance.  Demonstration, verbal and tactile cues throughout for technique.  NuStep - L3 x 6 min  Tested LE strength  THERAPEUTIC ACTIVITIES: To improve strength and functional performance.  Demonstration, verbal and tactile cues throughout for technique. Standing hip ABD with looped YTB at ankles x 10 bil Standing hip extension with looped YTB at ankles x 10 bil Standing hip flexion march with looped YTB at midfeet x 10 bil Standing heel toe raises 2 x 10 Sidesteps YTB at ankles 4x10 along counter   *All standing exercises with UE support at counter for balance.  NEUROMUSCULAR RE-EDUCATION: To improve balance, coordination, kinesthesia, proprioception, and reduce fall risk. Standing on Airex pad: Weight shifting x 10 fwd/back Step to floor back to airex 2x5 BLE Toe clears to 9 stool 2x5 BLE; x 10 additional reps BLE  08/17/2024  THERAPEUTIC EXERCISE: To improve strength and endurance.  Demonstration, verbal and tactile cues throughout for technique.  NuStep - L3 x 6 min  Seated LAQ with looped YTB at ankles 2 x 10  THERAPEUTIC ACTIVITIES: To improve strength and functional performance.  Demonstration, verbal and tactile cues throughout for technique. Standing hip ABD with looped YTB at ankles x 10 bil Standing hip extension with looped YTB at ankles x 10 bil Standing hip flexion march with looped YTB at midfeet x 10 bil Standing heel toe raises x 10 *All standing exercises with UE support on wall ladder for balance.  NEUROMUSCULAR RE-EDUCATION: To improve balance, coordination, kinesthesia, proprioception, and reduce fall risk. Standing on Airex pad: Weight shifting Marching in place Toe clears to 9 stool   08/15/24 Nustep L3x71min UE/LE Supine DKTC orange ball x 20 Leg curls 10lb x 10 BLE- lightning bolt feeling in L calf Leg ext 5lb 2x10 BLE Seated hip ABD  RTB x 10 Seated marching RTB x 10  NEUROMUSCULAR RE-EDUCATION: 5xSTS- 17.37 seconds Standing on airex:  Narrow BOS balance x 1 min  EC x 1 min  Normal BOS w/ head turns x5; trunk rotations x 5  Heel/toe raises x 10 B Bridges 2x10 orange pball   08/10/24 NEUROMUSCULAR RE-EDUCATION: Tandem gait along counter  2x down/back intermittent support Standing on airex:  Normal BOS balance x 1 min- wobbly  Narrow BOS balance x 1 min- wobbly  EC x 1 min  Normal BOS w/ head turns x5; trunk rotations x 5  THERAPEUTIC EXERCISE:  UBE L1.0 3 min fwd/back Bridges with PPT 2x10 Supine feet on orange pball:  DKTC x 10  Trunk rotation x 10 B  Iso HS curl + PPT x 10 B  THERAPEUTIC ACTIVITIES:  Standing hip abduction x 10 BLE Standing hip extension x 10 BLE Standing marching x 10 BLE Standing heel/toe raises x 10 BLE Sit to stands 2x5   08/08/24 NEUROMUSCULAR RE-EDUCATION: To improve coordination, kinesthesia, posture, and proprioception.  Standing heel/toe raises x 10 B- needed to sit after each exercise reporting dizziness for ~2-3 seconds Seated ab sets with beach ball 2x10 Seated B Shoulder ER and hip ER 2x10 Tandem stance x 30 BLE Clock balance R/L 12 to 6 o'clock 1x around Supine PPT 2x10 Supine marches with PPT x 10 B Bridges x 10  THERAPEUTIC EXERCISE: To improve strength, endurance, ROM, and flexibility Nustep L2x16min Brief HEP review LTR both ways x 10 Supine SLR x 10 BLE Supine SKTC stretch x 30 R/L Supine figure 4 stretch x 30 R/L   08/01/2024  PHYSICAL PERFORMANCE TEST or MEASUREMENT:  Berg Balance Test   Sit to Stand Able to stand  independently using hands    Standing Unsupported Able to stand 30 seconds unsupported   had to stop due to feeling dizzy   Sitting with Back Unsupported but Feet Supported on Floor or Stool Able to sit safely and securely 2 minutes    Stand to Sit Uses backs of legs against chair to control descent    Transfers Able to transfer  safely, definite need of hands    Standing Unsupported with Eyes Closed Able to stand 10 seconds with supervision    Standing Unsupported with Feet Together Able to place feet together independently but unable to hold for 30 seconds    From Standing, Reach Forward with Outstretched Arm Can reach confidently >25 cm (10)    From Standing Position, Pick up Object from Floor Able to pick up shoe, needs supervision    From Standing Position, Turn to Look Behind Over each Shoulder Turn sideways only but maintains balance    Turn 360 Degrees Needs close supervision or verbal cueing    Standing Unsupported, Alternately Place Feet on Step/Stool Able to stand independently and complete 8 steps >20 seconds    Standing Unsupported, One Foot in Front Needs help to step but can hold 15 seconds    Standing on One Leg Able to lift leg independently and hold equal to or more than 3 seconds    Total Score 35    Berg comment: < 36 high risk for falls (close to 100%)      Functional Gait  Assessment   Gait Level Surface Walks 20 ft, slow speed, abnormal gait pattern, evidence for imbalance or deviates 10-15 in outside of the 12 in walkway width. Requires more than 7 sec to ambulate 20 ft.    Change in Gait Speed Makes only minor adjustments to walking speed, or accomplishes a change in speed with significant gait deviations, deviates 10-15 in outside the 12 in walkway width, or changes speed but loses balance but is able to recover and continue walking.    Gait with Horizontal Head Turns Performs head turns with moderate changes in gait velocity,  slows down, deviates 10-15 in outside 12 in walkway width but recovers, can continue to walk.    Gait with Vertical Head Turns Performs task with moderate change in gait velocity, slows down, deviates 10-15 in outside 12 in walkway width but recovers, can continue to walk.    Gait and Pivot Turn Turns slowly, requires verbal cueing, or requires several small steps to catch  balance following turn and stop    Step Over Obstacle Is able to step over one shoe box (4.5 in total height) but must slow down and adjust steps to clear box safely. May require verbal cueing.    Gait with Narrow Base of Support Ambulates less than 4 steps heel to toe or cannot perform without assistance.    Gait with Eyes Closed Cannot walk 20 ft without assistance, severe gait deviations or imbalance, deviates greater than 15 in outside 12 in walkway width or will not attempt task.    Ambulating Backwards Walks 20 ft, slow speed, abnormal gait pattern, evidence for imbalance, deviates 10-15 in outside 12 in walkway width.    Steps Cannot do safely.    Total Score 7    FGA comment: < 19 = high risk fall       THERAPEUTIC EXERCISE: To improve strength and endurance.  Demonstration, verbal and tactile cues throughout for technique.  Seated heel toe raises x 10 Seated hip ADD isometrics with ball 10 x 5 Seated LAQ x 10 Seated YTB hip flexion march x 10 Seated B hip ABD with looped YTB at distal thighs x 10 - better tolerated isolating 1 leg at a time   07/30/2024  SELF CARE:  Reviewed eval findings and role of PT in addressing identified deficits as well as instruction in initial HEP (see below).    PATIENT EDUCATION:  Education details: HEP progression - YTB resistance added to standing exercises and seated LAQ  Person educated: Patient Education method: Explanation, Demonstration, Verbal cues, and Handouts Education comprehension: verbalized understanding, returned demonstration, verbal cues required, and needs further education  HOME EXERCISE PROGRAM: Access Code: [email or web address] URL: [email or web address] Date: 09/06/2024 Prepared by: Thai Hemrick  Exercises - Seated March with Resistance  - 1 x daily - 5-7 x weekly - 1-2 sets - 8-10 reps - 3 sec hold - Seated Hip Abduction with Resistance  - 1 x daily - 5-7 x weekly - 1-2 sets - 8-10 reps - 3-5 sec hold - Seated Hip  Adduction Isometrics with Ball  - 1 x daily - 5-7 x weekly - 1-2 sets - 8-10 reps - 3-5 sec hold - Standing Hip Abduction with Resistance at Ankles and Counter Support  - 1 x daily - 3-4 x weekly - 2 sets - 10 reps - 3 sec hold - Standing Hip Extension with Resistance at Ankles and Counter Support  - 1 x daily - 3-4 x weekly - 2 sets - 10 reps - 3 sec hold - Marching with Resistance  - 1 x daily - 3-4 x weekly - 2 sets - 10 reps - 3 sec hold - Seated Knee Extension with Resistance  - 1 x daily - 3-4 x weekly - 2 sets - 10 reps - 3 sec hold - Tandem Walking with Counter Support  - 1 x daily - 7 x weekly - 3 sets - 10 reps - Backward Walking with Counter Support  - 1 x daily - 7 x weekly - 3 sets - 10 reps   ASSESSMENT:  CLINICAL IMPRESSION:  Ariana Herrera arrives after being OOT last week on vacation. She noted a fall after sitting on a curb and trying to stand, and fell backward into grass. Balance test today reveal improvements, her static stability is good but while walking she is showing more balance deficits, especially when having to change directions or turn her head. FGA score shows high fall risk ---- BERG score has improved to low fall risk. Ariana Herrera will benefit from continued skilled PT to address dynamic balance deficits which are necessary for daily ambulation and activities.    EVAL: Ariana Herrera is a 58 y.o. female who was referred to physical therapy for evaluation and treatment for LE weakness and deconditioning 2 metastatic breast cancer.  She reports she experienced a recurrence of her cancer in December 2024 and ever since she has been losing ~10# per month and she feels like some of the weight loss has been related to muscle mass loss leading to generalized weakness and poor endurance with ADLs, daily mobility and household tasks.  Her energy levels are also impacted by low blood counts and she also experiences chemo-induced peripheral neuropathy as well as intermittent nerve  pain.  Patient has deficits in hip ROM, B LE flexibility, B LE strength, and impaired balance and activity tolerance which are interfering with ADLs and are impacting quality of life.  On LEFS patient scored 22/80 demonstrating moderate functional limitation.  Ariana Herrera will benefit from skilled PT to address above deficits to improve mobility and activity tolerance with decreased pain interference.  OBJECTIVE IMPAIRMENTS: Abnormal gait, cardiopulmonary status limiting activity, decreased activity tolerance, decreased endurance, decreased knowledge of use of DME, decreased mobility, difficulty walking, decreased strength, decreased safety awareness, dizziness, impaired flexibility, impaired sensation, postural dysfunction, and pain.   ACTIVITY LIMITATIONS: carrying, lifting, bending, sitting, standing, squatting, stairs, transfers, bed mobility, bathing, dressing, hygiene/grooming, and locomotion level  PARTICIPATION LIMITATIONS: meal prep, cleaning, laundry, driving, shopping, community activity, and yard work  PERSONAL FACTORS: Fitness, Past/current experiences, Time since onset of injury/illness/exacerbation, and 3+ comorbidities: Angina; anxiety; arthritis - knee, hips, back and neck (4 bulging discs in neck); asthma; bipolar 1; breast cancer 2019 and recurrent 2023 to right breast, right axillary, and left neck; R LE DVT; DM-II; HLD; MI; pancreatitis; dizziness are also affecting patient's functional outcome.   REHAB POTENTIAL: Good  CLINICAL DECISION MAKING: Unstable/unpredictable  EVALUATION COMPLEXITY: High   GOALS: Goals reviewed with patient? Yes  SHORT TERM GOALS: Target date: 09/10/2024  Patient will be independent with initial HEP. Baseline: Initial HEP provided on eval 08/01/24 - reviewed initial HEP Goal status: MET - 08/17/24  2.  Complete standardized balance testing and update LTG's accordingly. Baseline: Berg & FGA pending Goal status: MET - 08/01/24  3.   Patient will improve 5x STS time to </= 24 seconds for improved efficiency and safety with transfers. Baseline: 30.87 sec Goal status: MET - 08/15/24 - 17.37 sec  LONG TERM GOALS: Target date: 10/22/2024  Patient will be independent with advanced/ongoing HEP to improve outcomes and carryover.  Baseline:  Goal status: INITIAL  2.  Patient will demonstrate improved B LE strength to >/= 4 to 4+/5 for improved stability and ease of mobility. Baseline: Refer to above LE MMT table Goal status: MET- 08/22/24  3.  Patient will be able to ambulate 600' w/o AD and normal gait pattern without increased pain to access community.  Baseline: very limited ambulation tolerance  Goal status: INITIAL  4. Patient will report >/= 35/80 on LEFS (  MCID = 9 pts) to demonstrate improved functional ability. Baseline: 22 / 80 = 27.5 % Goal status: INITIAL  5.  Patient will demonstrate at least 19/30 on FGA to decrease risk of falls. Baseline: 08/01/24 - 7/30; < 19 = high risk fall  Goal status: IN PROGRESS- 09/06/24   6.  Patient will improve Berg score by at least 8 points to improve safety and stability with ADLs in standing and reduce risk for falls Baseline: 08/01/24 - 35/56; < 36 high risk for falls (close to 100%) Goal status: MET- 09/06/24   PLAN:  PT FREQUENCY: 2x/week  PT DURATION: 12 weeks  PLANNED INTERVENTIONS: 97164- PT Re-evaluation, 97750- Physical Performance Testing, 97110-Therapeutic exercises, 97530- Therapeutic activity, 97112- Neuromuscular re-education, 97535- Self Care, 02859- Manual therapy, [id number]- Gait training, [id number]- Aquatic Therapy, [phone]- Electrical stimulation (unattended), [id number]- Ultrasound, 02966- Ionotophoresis 4mg /ml Dexamethasone , 79439 (1-2 muscles), 20561 (3+ muscles)- Dry Needling, Patient/Family education, Balance training, Stair training, Taping, Vestibular training, DME instructions, Cryotherapy, and Moist heat  PLAN FOR NEXT SESSION: dynamic balance activities as  tolerated; LE stengthening   Ariana Herrera, PTA 09/06/2024, 12:17 PM

## 2024-09-07 ENCOUNTER — Inpatient Hospital Stay: Attending: Hematology & Oncology

## 2024-09-07 VITALS — BP 113/47 | HR 108 | Temp 98.1°F | Resp 18

## 2024-09-07 DIAGNOSIS — E538 Deficiency of other specified B group vitamins: Secondary | ICD-10-CM | POA: Diagnosis not present

## 2024-09-07 DIAGNOSIS — Z17421 Hormone receptor negative with human epidermal growth factor receptor 2 negative status: Secondary | ICD-10-CM | POA: Diagnosis not present

## 2024-09-07 DIAGNOSIS — C50412 Malignant neoplasm of upper-outer quadrant of left female breast: Secondary | ICD-10-CM | POA: Diagnosis not present

## 2024-09-07 DIAGNOSIS — E611 Iron deficiency: Secondary | ICD-10-CM | POA: Insufficient documentation

## 2024-09-07 DIAGNOSIS — D509 Iron deficiency anemia, unspecified: Secondary | ICD-10-CM

## 2024-09-07 DIAGNOSIS — C7989 Secondary malignant neoplasm of other specified sites: Secondary | ICD-10-CM | POA: Diagnosis not present

## 2024-09-07 MED ORDER — METHYLPREDNISOLONE SODIUM SUCC 125 MG IJ SOLR: 125.0000 mg | Freq: Once | INTRAMUSCULAR | Status: DC | PRN

## 2024-09-07 MED ORDER — SODIUM CHLORIDE 0.9 % IV SOLN: Freq: Once | INTRAVENOUS | Status: DC | PRN

## 2024-09-07 MED ORDER — FAMOTIDINE IN NACL 20-0.9 MG/50ML-% IV SOLN: 20.0000 mg | Freq: Once | INTRAVENOUS | Status: DC | PRN

## 2024-09-07 MED ORDER — DIPHENHYDRAMINE HCL 50 MG/ML IJ SOLN: 50.0000 mg | Freq: Once | INTRAMUSCULAR | Status: DC | PRN

## 2024-09-07 MED ORDER — EPINEPHRINE 0.3 MG/0.3ML IJ SOAJ: 0.3000 mg | Freq: Once | INTRAMUSCULAR | Status: DC | PRN

## 2024-09-07 MED ORDER — SODIUM CHLORIDE 0.9 % IV SOLN
1000.0000 mg | Freq: Once | INTRAVENOUS | Status: AC
  Administered 2024-09-07: 1000 mg via INTRAVENOUS
  Filled 2024-09-07: qty 10

## 2024-09-07 MED ORDER — ALBUTEROL SULFATE HFA 108 (90 BASE) MCG/ACT IN AERS: 2.0000 | INHALATION_SPRAY | Freq: Once | RESPIRATORY_TRACT | Status: DC | PRN

## 2024-09-07 MED ORDER — SODIUM CHLORIDE 0.9 % IV SOLN: INTRAVENOUS | Status: DC

## 2024-09-07 NOTE — Progress Notes (Signed)
 First time iron today. Tolerated tmt well and monitor x 30 mins post without issues.  Pt. Reported new lump to her left breast. One started 3 weeks ago, the other a couple of days ago. Itchy and not painful. Per Dr. Timmy, pt. Needs to contact PCP.  Pt. made aware and will contact PCP.

## 2024-09-07 NOTE — Patient Instructions (Signed)
 [facility] - A DEPT OF [facility]  Discharge Instructions: Thank you for choosing [facility] to provide your oncology and hematology care.   If you have a lab appointment with the Cancer Center, please go directly to the Cancer Center and check in at the registration area.  Wear comfortable clothing and clothing appropriate for easy access to any Portacath or PICC line.   We strive to give you quality time with your provider. You may need to reschedule your appointment if you arrive late (15 or more minutes).  Arriving late affects you and other patients whose appointments are after yours.  Also, if you miss three or more appointments without notifying the office, you may be dismissed from the clinic at the provider's discretion.      For prescription refill requests, have your pharmacy contact our office and allow 72 hours for refills to be completed.    Today you received the following agents Monoferric      To help prevent nausea and vomiting after your treatment, we encourage you to take your nausea medication as directed.  BELOW ARE SYMPTOMS THAT SHOULD BE REPORTED IMMEDIATELY: *FEVER GREATER THAN 100.4 F (38 C) OR HIGHER *CHILLS OR SWEATING *NAUSEA AND VOMITING THAT IS NOT CONTROLLED WITH YOUR NAUSEA MEDICATION *UNUSUAL SHORTNESS OF BREATH *UNUSUAL BRUISING OR BLEEDING *URINARY PROBLEMS (pain or burning when urinating, or frequent urination) *BOWEL PROBLEMS (unusual diarrhea, constipation, pain near the anus) TENDERNESS IN MOUTH AND THROAT WITH OR WITHOUT PRESENCE OF ULCERS (sore throat, sores in mouth, or a toothache) UNUSUAL RASH, SWELLING OR PAIN  UNUSUAL VAGINAL DISCHARGE OR ITCHING   Items with * indicate a potential emergency and should be followed up as soon as possible or go to the Emergency Department if any problems should occur.  Please show the CHEMOTHERAPY ALERT CARD or IMMUNOTHERAPY ALERT CARD at check-in to the  Emergency Department and triage nurse. Should you have questions after your visit or need to cancel or reschedule your appointment, please contact [facility] - A DEPT OF [facility]  [phone] and follow the prompts.  Office hours are 8:00 a.m. to 4:30 p.m. Monday - Friday. Please note that voicemails left after 4:00 p.m. may not be returned until the following business day.  We are closed weekends and major holidays. You have access to a nurse at all times for urgent questions. Please call the main number to the clinic [phone] and follow the prompts.  For any non-urgent questions, you may also contact your provider using MyChart. We now offer e-Visits for anyone 30 and older to request care online for non-urgent symptoms. For details visit [email or web address].   Also download the MyChart app! Go to the app store, search "MyChart", open the app, select [place], and log in with your MyChart username and password.

## 2024-09-10 ENCOUNTER — Ambulatory Visit: Payer: Self-pay

## 2024-09-10 DIAGNOSIS — M6281 Muscle weakness (generalized): Secondary | ICD-10-CM | POA: Diagnosis not present

## 2024-09-10 DIAGNOSIS — R2681 Unsteadiness on feet: Secondary | ICD-10-CM

## 2024-09-10 DIAGNOSIS — R262 Difficulty in walking, not elsewhere classified: Secondary | ICD-10-CM

## 2024-09-10 NOTE — Therapy (Addendum)
 OUTPATIENT PHYSICAL THERAPY TREATMENT   Patient Name: Ariana Herrera MRN: [id number] DOB: 11-May-1966, 58 y.o., female Today's Date: 09/10/2024   END OF SESSION:  PT End of Session - 09/10/24 1059     Visit Number 9    Date for Recertification  10/22/24    Authorization Type [employer]    Authorization Time Period auth pending    PT Start Time 1020    PT Stop Time 1103    PT Time Calculation (min) 43 min    Activity Tolerance Patient tolerated treatment well    Behavior During Therapy WFL for tasks assessed/performed                  Past Medical History:  Diagnosis Date   Anginal pain    Anxiety    Arthritis    Asthma    Bipolar 1 disorder (HCC)    Bipolar disease, chronic (HCC)    Breast cancer in female (HCC)    2019 and recurrent in 2023 to right breast, right axillary, and left neck   Coronary artery disease    Deep vein blood clot of right lower extremity (HCC)    Diabetes mellitus type 2 in obese    Goals of care, counseling/discussion 11/23/2021   History of external beam radiation therapy    Left neck-10/07/23-11/24/23-[facility]   Hyperlipidemia    Lymphedema    MI (myocardial infarction) (HCC)    was in [place]    Obesity    Pancreatitis    Personal history of chemotherapy    Personal history of radiation therapy    Sleep apnea    Past Surgical History:  Procedure Laterality Date   BREAST BIOPSY Left 10/03/2023   times 2   BREAST LUMPECTOMY Left 03/2019   CATARACT EXTRACTION Bilateral    CHOLECYSTECTOMY     IR IMAGING GUIDED PORT INSERTION  12/15/2021   IR PATIENT EVAL TECH 0-60 MINS  10/06/2023   IR REMOVAL TUN ACCESS W/ PORT W/O FL MOD SED  12/20/2019   IR US  GUIDE BX ASP/DRAIN  11/02/2021   LEFT HEART CATH AND CORONARY ANGIOGRAPHY N/A 08/22/2020   Procedure: LEFT HEART CATH AND CORONARY ANGIOGRAPHY;  Surgeon: Verlin Lonni BIRCH, MD;  Location: [facility];  Service: Cardiovascular;  Laterality: N/A;   TRIGGER  FINGER RELEASE Bilateral    x 5   TUBAL LIGATION     UMBILICAL HERNIA REPAIR     x 2   uterine ablation     Patient Active Problem List   Diagnosis Date Noted   IDA (iron deficiency anemia) 08/27/2024   Coronary artery disease involving native coronary artery of native heart without angina pectoris 08/16/2024   Internal jugular (IJ) vein thromboembolism, acute (HCC) 10/06/2023   Facial swelling 10/05/2023   Tobacco use 07/14/2023   Type 2 diabetes mellitus with hyperglycemia, with long-term current use of insulin  (HCC) 06/23/2023   Stress incontinence of urine 06/23/2023   Moderate persistent asthma without complication 06/23/2023   Obesity (BMI 30-39.9) 06/23/2023   Encounter for orthopedic follow-up care 09/23/2021   Acquired trigger finger of left middle finger 08/27/2021   Chest pain of uncertain etiology    History of MI (myocardial infarction) 08/20/2020   History of pancreatitis 08/20/2020   Hyperlipidemia associated with type 2 diabetes mellitus (HCC) 08/20/2020   Bipolar disease, chronic (HCC)    Breast cancer (HCC) 11/26/2019   Chemotherapy-induced neuropathy 11/26/2019   History of DVT (deep vein thrombosis) 11/26/2019  PCP: Cyndi Shaver, PA-C (Inactive)   REFERRING PROVIDER: Timmy Maude SAUNDERS, MD   REFERRING DIAG: [id number] (ICD-10-CM) - Malignant neoplasm involving both nipple and areola of left breast in female, unspecified estrogen receptor status (HCC)   Per MD order: She is a bit deconditioned. She has lost weight. She does have breast cancer. I think she just needs some strengthening exercises for her legs.   THERAPY DIAG:  Muscle weakness (generalized)  Difficulty in walking, not elsewhere classified  Unsteadiness on feet  RATIONALE FOR EVALUATION AND TREATMENT: Rehabilitation  ONSET DATE: ~Dec 2024  NEXT MD VISIT: 08/24/24   SUBJECTIVE:                                                                                                                                                                                                          SUBJECTIVE STATEMENT: Pt notes neck pain today, along with mild dizziness and nausea only when walking. Notes that this happens at times, may be from chemo and other meds.  EVAL: Pt reports her cancer returned in Dec 2024 and ever since she has been losing ~10# per month and she feels like her muscles have gone with the weight.  Now has difficulty with housework and working the yard or garden.  Unable to go shopping w/o using an electric cart. Also reports nerve pain at times.  She is in remission but does have terminal metastatic breast cancer, currently taking oral chemo - 3 weeks on/1 week off.  She states her blood counts are currently low.  PAIN: Are you having pain? No and Yes: NPRS scale: 7/10  Pain location: neck  PERTINENT HISTORY:  Angina; anxiety; arthritis - knee, hips, back and neck (4 bulging discs in neck); asthma; bipolar 1; breast cancer 2019 and recurrent 2023 to right breast, right axillary, and left neck; R LE DVT; DM-II; HLD; MI; pancreatitis; dizziness  PRECAUTIONS: None  RED FLAGS: None  WEIGHT BEARING RESTRICTIONS: No  FALLS:  Has patient fallen in last 6 months? No  LIVING ENVIRONMENT: Lives with: lives with their spouse and lives with their son Lives in: Mobile home - 5th wheel Stairs: Yes: External: 3 steps; on left going up Has following equipment at home: Single point cane, Environmental Consultant - 2 wheeled, Wheelchair (manual), and shower chair  OCCUPATION: On disability  PLOF: Independent with basic ADLs, Needs assistance with homemaking, and Leisure: gardening, travel - unable to participate in leisure activities right now  PATIENT GOALS: Be able to stand longer periods of time - get strength back in my legs.  Walk around easier.   OBJECTIVE: (objective measures completed at initial evaluation unless otherwise dated)  DIAGNOSTIC FINDINGS:  06/11/24 - CT ANGIOGRAPHY CHEST  WITH CONTRAST IMPRESSION: 1. Tiny subsegmental pulmonary embolism in a posterior left lower lobe pulmonary artery. No evidence of right heart strain. 2. Small pericardial effusion. 3. Postsurgical changes in the left breast with diffuse dermal and trabecular thickening similar to prior. Findings in the neck reported separately. 4. Unchanged vague nodularity in the left supraclavicular region. 5. Aortic Atherosclerosis (ICD10-I70.0).   06/03/24 - MR NECK WITH AND WITHOUT INTRAVENOUS CONTRAST IMPRESSION: 1. No acute findings within the soft tissues of the neck. 2. Mild to moderate degenerative changes in the cervical spine as described above. No abnormal enhancement of the spinal cord or nerve roots. 3. Interval resolution of left supraclavicular lymphadenopathy and soft tissue stranding.  03/02/24 - MRI HEAD WITHOUT AND WITH CONTRAST IMPRESSION: No acute intracranial abnormality. No findings to suggest intracranial metastatic disease.   Punctate susceptibility in the right parietal white matter slightly more conspicuous on current study likely due to differences in technique. No associated edema or enhancement. Findings likely reflect chronic microhemorrhages. Recommend attention on follow-up.   Similar 7 mm dural-based enhancing lesion over the left middle frontal gyrus suggestive of a small meningioma.   Trace mastoid effusions.  PATIENT SURVEYS:  LEFS  Extreme difficulty/unable (0), Quite a bit of difficulty (1), Moderate difficulty (2), Little difficulty (3), No difficulty (4) Survey date:  07/30/24 09/10/24  Any of your usual work, housework or school activities 1   2. Usual hobbies, recreational or sporting activities 0   3. Getting into/out of the bath 1   4. Walking between rooms 2   5. Putting on socks/shoes 4   6. Squatting  0   7. Lifting an object, like a bag of groceries from the floor 2   8. Performing light activities around your home 1   9. Performing heavy  activities around your home 0   10. Getting into/out of a car 4   11. Walking 2 blocks 0   12. Walking 1 mile 0   13. Going up/down 10 stairs (1 flight) 1   14. Standing for 1 hour 3   15.  sitting for 1 hour 0   16. Running on even ground 0   17. Running on uneven ground 0   18. Making sharp turns while running fast 0   19. Hopping  1   20. Rolling over in bed 2   Score total:  22 / 80 = 27.5 % 37 / 80 = 46.3 %  Functional limitation:   moderate      COGNITION: Overall cognitive status: Impaired (Chemo brain)  SENSATION: Chemo-based neuropathy in B hands and feet; nerve pain in legs  EDEMA:  Fluctuating edema in feet  POSTURE:  No Significant postural limitations  MUSCLE LENGTH: Hamstrings: mild tight B Piriformis: mod/severe tight R, mild tight L  LOWER EXTREMITY ROM: Grossly WFL other than limitation in hip rotation R>L  LOWER EXTREMITY MMT:  MMT Right eval Left eval R 08/22/24 L 08/22/24  Hip flexion 4- 4- 4+ 4+  Hip extension 3+ 4- 4 4  Hip abduction 3+ 3+ 4+ 4  Hip adduction 3- 3- 4 4  Hip internal rotation 4 4 4  4+  Hip external rotation 4- 4- 4+ 4  Knee flexion 4- 4- 4 4+  Knee extension 4 4 4+ 5  Ankle dorsiflexion 4 4- 4+ 4+  Ankle plantarflexion  3 (4 SLS HR) 3 (4 SLS HR) 9 HR 12 HR  Ankle inversion      Ankle eversion       (Blank rows = not tested)  FUNCTIONAL TESTS:  5 times sit to stand: 30.87 sec (several tries/attempts necessary on some reps) Timed up and go (TUG): 13.84 sec 10 meter walk test: 11.32 sec  Gait speed: 2.90 ft/sec Berg Balance Scale: 08/01/24 - 35/56; < 36 high risk for falls (close to 100%) Functional gait assessment: 08/01/24 - 7/30; < 19 = high risk fall  Interpretation of scores: Non-Specific Older Adults Cutoff Score: <=22/30 = risk of falls Parkinson's Disease Cutoff score <15/30= fall risk (Hoehn & Yahr 1-4)  Minimally Clinically Important Difference (MCID)  Stroke (acute, subacute, and chronic) = MDC: 4.2  points Vestibular (acute) = MDC: 6 points Community Dwelling Older Adults =  MCID: 4 points Parkinson's Disease  =  MDC: 4.3 points  (Academy of Neurologic Physical Therapy (nd). Functional Gait Assessment. Retrieved from https://www.neuropt.org/docs/default-source/cpgs/core-outcome-measures/function-gait-assessment-pocket-guide-proof9-(2).pdf?sfvrsn=b49f35043_0.)  GAIT: Distance walked: clinic distances Assistive device utilized: None Level of assistance: Complete Independence and SBA Gait pattern: decreased gait velocity, decreased stride length, lateral hip instability, and wide BOS   TODAY'S TREATMENT:  09/10/2024  THERAPEUTIC EXERCISE: To improve strength and endurance.  Demonstration, verbal and tactile cues throughout for technique.  NuStep - L3 x 6 min  Gait in hallway- 432ft veers off to the sides at times, noted feeling swimmy x 2 Pulse checked manually after gait- 100 BPM NEUROMUSCULAR RE-EDUCATION: To improve coordination, kinesthesia, proprioception All exercises w/ TrA contraction: Supine bent knee fallouts GTB 2x10 Supine march GTB 2x10 Bridges GTB x 10 B shoulder ER YTB x 10 B shoulder horizontal ABD YTB x 10 Chin tucks x 10 B   09/06/2024  THERAPEUTIC EXERCISE: To improve strength and endurance.  Demonstration, verbal and tactile cues throughout for technique.  NuStep - L3 x 6 min    NEUROMUSCULAR RE-EDUCATION: To improve balance, coordination, kinesthesia, proprioception, and reduce fall risk.  [facility] PT Assessment - 09/06/24 0001       Berg Balance Test   Sit to Stand Able to stand without using hands and stabilize independently    Standing Unsupported Able to stand safely 2 minutes    Sitting with Back Unsupported but Feet Supported on Floor or Stool Able to sit safely and securely 2 minutes    Stand to Sit Sits safely with minimal use of hands    Transfers Able to transfer safely, minor use of hands    Standing Unsupported with Eyes Closed Able to  stand 10 seconds safely    Standing Unsupported with Feet Together Able to place feet together independently and stand 1 minute safely    From Standing, Reach Forward with Outstretched Arm Can reach confidently >25 cm (10)    From Standing Position, Pick up Object from Floor Able to pick up shoe safely and easily    From Standing Position, Turn to Look Behind Over each Shoulder Looks behind from both sides and weight shifts well    Turn 360 Degrees Able to turn 360 degrees safely in 4 seconds or less    Standing Unsupported, Alternately Place Feet on Step/Stool Able to stand independently and safely and complete 8 steps in 20 seconds    Standing Unsupported, One Foot in Front Needs help to step but can hold 15 seconds    Standing on One Leg Tries to lift leg/unable to hold 3 seconds but remains standing independently  Total Score 50      Functional Gait  Assessment   Gait Level Surface Walks 20 ft, slow speed, abnormal gait pattern, evidence for imbalance or deviates 10-15 in outside of the 12 in walkway width. Requires more than 7 sec to ambulate 20 ft.    Change in Gait Speed Able to smoothly change walking speed without loss of balance or gait deviation. Deviate no more than 6 in outside of the 12 in walkway width.    Gait with Horizontal Head Turns Performs head turns with moderate changes in gait velocity, slows down, deviates 10-15 in outside 12 in walkway width but recovers, can continue to walk.    Gait with Vertical Head Turns Performs task with slight change in gait velocity (eg, minor disruption to smooth gait path), deviates 6 - 10 in outside 12 in walkway width or uses assistive device    Gait and Pivot Turn Pivot turns safely within 3 sec and stops quickly with no loss of balance.    Step Over Obstacle Is able to step over 2 stacked shoe boxes taped together (9 in total height) without changing gait speed. No evidence of imbalance.    Gait with Narrow Base of Support Ambulates less  than 4 steps heel to toe or cannot perform without assistance.    Gait with Eyes Closed Walks 20 ft, slow speed, abnormal gait pattern, evidence for imbalance, deviates 10-15 in outside 12 in walkway width. Requires more than 9 sec to ambulate 20 ft.    Ambulating Backwards Walks 20 ft, slow speed, abnormal gait pattern, evidence for imbalance, deviates 10-15 in outside 12 in walkway width.    Steps Two feet to a stair, must use rail.    Total Score 16        *All standing exercises with UE support at counter for balance. Tandem gait to end; retro gait to other end 3x each Gait with head turns horiz 2x, vertical 2x  08/22/2024  THERAPEUTIC EXERCISE: To improve strength and endurance.  Demonstration, verbal and tactile cues throughout for technique.  NuStep - L3 x 6 min  Tested LE strength  THERAPEUTIC ACTIVITIES: To improve strength and functional performance.  Demonstration, verbal and tactile cues throughout for technique. Standing hip ABD with looped YTB at ankles x 10 bil Standing hip extension with looped YTB at ankles x 10 bil Standing hip flexion march with looped YTB at midfeet x 10 bil Standing heel toe raises 2 x 10 Sidesteps YTB at ankles 4x10 along counter   *All standing exercises with UE support at counter for balance.  NEUROMUSCULAR RE-EDUCATION: To improve balance, coordination, kinesthesia, proprioception, and reduce fall risk. Standing on Airex pad: Weight shifting x 10 fwd/back Step to floor back to airex 2x5 BLE Toe clears to 9 stool 2x5 BLE; x 10 additional reps BLE  08/17/2024  THERAPEUTIC EXERCISE: To improve strength and endurance.  Demonstration, verbal and tactile cues throughout for technique.  NuStep - L3 x 6 min  Seated LAQ with looped YTB at ankles 2 x 10  THERAPEUTIC ACTIVITIES: To improve strength and functional performance.  Demonstration, verbal and tactile cues throughout for technique. Standing hip ABD with looped YTB at ankles x 10  bil Standing hip extension with looped YTB at ankles x 10 bil Standing hip flexion march with looped YTB at midfeet x 10 bil Standing heel toe raises x 10 *All standing exercises with UE support on wall ladder for balance.  NEUROMUSCULAR RE-EDUCATION: To improve balance,  coordination, kinesthesia, proprioception, and reduce fall risk. Standing on Airex pad: Weight shifting Marching in place Toe clears to 9 stool   08/15/24 Nustep L3x89min UE/LE Supine DKTC orange ball x 20 Leg curls 10lb x 10 BLE- lightning bolt feeling in L calf Leg ext 5lb 2x10 BLE Seated hip ABD RTB x 10 Seated marching RTB x 10  NEUROMUSCULAR RE-EDUCATION: 5xSTS- 17.37 seconds Standing on airex:  Narrow BOS balance x 1 min  EC x 1 min  Normal BOS w/ head turns x5; trunk rotations x 5  Heel/toe raises x 10 B Bridges 2x10 orange pball   08/10/24 NEUROMUSCULAR RE-EDUCATION: Tandem gait along counter 2x down/back intermittent support Standing on airex:  Normal BOS balance x 1 min- wobbly  Narrow BOS balance x 1 min- wobbly  EC x 1 min  Normal BOS w/ head turns x5; trunk rotations x 5  THERAPEUTIC EXERCISE:  UBE L1.0 3 min fwd/back Bridges with PPT 2x10 Supine feet on orange pball:  DKTC x 10  Trunk rotation x 10 B  Iso HS curl + PPT x 10 B  THERAPEUTIC ACTIVITIES:  Standing hip abduction x 10 BLE Standing hip extension x 10 BLE Standing marching x 10 BLE Standing heel/toe raises x 10 BLE Sit to stands 2x5   08/08/24 NEUROMUSCULAR RE-EDUCATION: To improve coordination, kinesthesia, posture, and proprioception.  Standing heel/toe raises x 10 B- needed to sit after each exercise reporting dizziness for ~2-3 seconds Seated ab sets with beach ball 2x10 Seated B Shoulder ER and hip ER 2x10 Tandem stance x 30 BLE Clock balance R/L 12 to 6 o'clock 1x around Supine PPT 2x10 Supine marches with PPT x 10 B Bridges x 10  THERAPEUTIC EXERCISE: To improve strength, endurance, ROM, and  flexibility Nustep L2x66min Brief HEP review LTR both ways x 10 Supine SLR x 10 BLE Supine SKTC stretch x 30 R/L Supine figure 4 stretch x 30 R/L   08/01/2024  PHYSICAL PERFORMANCE TEST or MEASUREMENT:  Berg Balance Test   Sit to Stand Able to stand  independently using hands    Standing Unsupported Able to stand 30 seconds unsupported   had to stop due to feeling dizzy   Sitting with Back Unsupported but Feet Supported on Floor or Stool Able to sit safely and securely 2 minutes    Stand to Sit Uses backs of legs against chair to control descent    Transfers Able to transfer safely, definite need of hands    Standing Unsupported with Eyes Closed Able to stand 10 seconds with supervision    Standing Unsupported with Feet Together Able to place feet together independently but unable to hold for 30 seconds    From Standing, Reach Forward with Outstretched Arm Can reach confidently >25 cm (10)    From Standing Position, Pick up Object from Floor Able to pick up shoe, needs supervision    From Standing Position, Turn to Look Behind Over each Shoulder Turn sideways only but maintains balance    Turn 360 Degrees Needs close supervision or verbal cueing    Standing Unsupported, Alternately Place Feet on Step/Stool Able to stand independently and complete 8 steps >20 seconds    Standing Unsupported, One Foot in Front Needs help to step but can hold 15 seconds    Standing on One Leg Able to lift leg independently and hold equal to or more than 3 seconds    Total Score 35    Berg comment: < 36 high risk for falls (  close to 100%)      Functional Gait  Assessment   Gait Level Surface Walks 20 ft, slow speed, abnormal gait pattern, evidence for imbalance or deviates 10-15 in outside of the 12 in walkway width. Requires more than 7 sec to ambulate 20 ft.    Change in Gait Speed Makes only minor adjustments to walking speed, or accomplishes a change in speed with significant gait deviations, deviates  10-15 in outside the 12 in walkway width, or changes speed but loses balance but is able to recover and continue walking.    Gait with Horizontal Head Turns Performs head turns with moderate changes in gait velocity, slows down, deviates 10-15 in outside 12 in walkway width but recovers, can continue to walk.    Gait with Vertical Head Turns Performs task with moderate change in gait velocity, slows down, deviates 10-15 in outside 12 in walkway width but recovers, can continue to walk.    Gait and Pivot Turn Turns slowly, requires verbal cueing, or requires several small steps to catch balance following turn and stop    Step Over Obstacle Is able to step over one shoe box (4.5 in total height) but must slow down and adjust steps to clear box safely. May require verbal cueing.    Gait with Narrow Base of Support Ambulates less than 4 steps heel to toe or cannot perform without assistance.    Gait with Eyes Closed Cannot walk 20 ft without assistance, severe gait deviations or imbalance, deviates greater than 15 in outside 12 in walkway width or will not attempt task.    Ambulating Backwards Walks 20 ft, slow speed, abnormal gait pattern, evidence for imbalance, deviates 10-15 in outside 12 in walkway width.    Steps Cannot do safely.    Total Score 7    FGA comment: < 19 = high risk fall       THERAPEUTIC EXERCISE: To improve strength and endurance.  Demonstration, verbal and tactile cues throughout for technique.  Seated heel toe raises x 10 Seated hip ADD isometrics with ball 10 x 5 Seated LAQ x 10 Seated YTB hip flexion march x 10 Seated B hip ABD with looped YTB at distal thighs x 10 - better tolerated isolating 1 leg at a time   07/30/2024  SELF CARE:  Reviewed eval findings and role of PT in addressing identified deficits as well as instruction in initial HEP (see below).    PATIENT EDUCATION:  Education details: HEP progression - YTB resistance added to standing exercises and seated  LAQ  Person educated: Patient Education method: Explanation, Demonstration, Verbal cues, and Handouts Education comprehension: verbalized understanding, returned demonstration, verbal cues required, and needs further education  HOME EXERCISE PROGRAM: Access Code: [email or web address] URL: [email or web address] Date: 09/06/2024 Prepared by: Magda Muise  Exercises - Seated March with Resistance  - 1 x daily - 5-7 x weekly - 1-2 sets - 8-10 reps - 3 sec hold - Seated Hip Abduction with Resistance  - 1 x daily - 5-7 x weekly - 1-2 sets - 8-10 reps - 3-5 sec hold - Seated Hip Adduction Isometrics with Ball  - 1 x daily - 5-7 x weekly - 1-2 sets - 8-10 reps - 3-5 sec hold - Standing Hip Abduction with Resistance at Ankles and Counter Support  - 1 x daily - 3-4 x weekly - 2 sets - 10 reps - 3 sec hold - Standing Hip Extension with Resistance at Ankles and Counter Support  - 1  x daily - 3-4 x weekly - 2 sets - 10 reps - 3 sec hold - Marching with Resistance  - 1 x daily - 3-4 x weekly - 2 sets - 10 reps - 3 sec hold - Seated Knee Extension with Resistance  - 1 x daily - 3-4 x weekly - 2 sets - 10 reps - 3 sec hold - Tandem Walking with Counter Support  - 1 x daily - 7 x weekly - 3 sets - 10 reps - Backward Walking with Counter Support  - 1 x daily - 7 x weekly - 3 sets - 10 reps   ASSESSMENT:  CLINICAL IMPRESSION: Ariana Herrera arrived noting some dizziness while walking however not while sitting, denies when going from sit to stand, also noted with increased neck pain she has more dizziness when walking. After gait her pulse was 100 BPM, which she notes she normally has high pulse rate and low BP. She completed the interventions did not report increase in sx throughout session. Ariana Herrera will benefit from continued skilled PT to address dynamic balance deficits which are necessary for daily ambulation and activities.    EVAL: Ariana Herrera is a 58 y.o. female who was referred to physical  therapy for evaluation and treatment for LE weakness and deconditioning 2 metastatic breast cancer.  She reports she experienced a recurrence of her cancer in December 2024 and ever since she has been losing ~10# per month and she feels like some of the weight loss has been related to muscle mass loss leading to generalized weakness and poor endurance with ADLs, daily mobility and household tasks.  Her energy levels are also impacted by low blood counts and she also experiences chemo-induced peripheral neuropathy as well as intermittent nerve pain.  Patient has deficits in hip ROM, B LE flexibility, B LE strength, and impaired balance and activity tolerance which are interfering with ADLs and are impacting quality of life.  On LEFS patient scored 22/80 demonstrating moderate functional limitation.  Ariana Herrera will benefit from skilled PT to address above deficits to improve mobility and activity tolerance with decreased pain interference.  OBJECTIVE IMPAIRMENTS: Abnormal gait, cardiopulmonary status limiting activity, decreased activity tolerance, decreased endurance, decreased knowledge of use of DME, decreased mobility, difficulty walking, decreased strength, decreased safety awareness, dizziness, impaired flexibility, impaired sensation, postural dysfunction, and pain.   ACTIVITY LIMITATIONS: carrying, lifting, bending, sitting, standing, squatting, stairs, transfers, bed mobility, bathing, dressing, hygiene/grooming, and locomotion level  PARTICIPATION LIMITATIONS: meal prep, cleaning, laundry, driving, shopping, community activity, and yard work  PERSONAL FACTORS: Fitness, Past/current experiences, Time since onset of injury/illness/exacerbation, and 3+ comorbidities: Angina; anxiety; arthritis - knee, hips, back and neck (4 bulging discs in neck); asthma; bipolar 1; breast cancer 2019 and recurrent 2023 to right breast, right axillary, and left neck; R LE DVT; DM-II; HLD; MI; pancreatitis;  dizziness are also affecting patient's functional outcome.   REHAB POTENTIAL: Good  CLINICAL DECISION MAKING: Unstable/unpredictable  EVALUATION COMPLEXITY: High   GOALS: Goals reviewed with patient? Yes  SHORT TERM GOALS: Target date: 09/10/2024  Patient will be independent with initial HEP. Baseline: Initial HEP provided on eval 08/01/24 - reviewed initial HEP Goal status: MET - 08/17/24  2.  Complete standardized balance testing and update LTG's accordingly. Baseline: Berg & FGA pending Goal status: MET - 08/01/24  3.  Patient will improve 5x STS time to </= 24 seconds for improved efficiency and safety with transfers. Baseline: 30.87 sec Goal status: MET - 08/15/24 -  17.37 sec  LONG TERM GOALS: Target date: 10/22/2024  Patient will be independent with advanced/ongoing HEP to improve outcomes and carryover.  Baseline:  Goal status: INITIAL  2.  Patient will demonstrate improved B LE strength to >/= 4 to 4+/5 for improved stability and ease of mobility. Baseline: Refer to above LE MMT table Goal status: MET- 08/22/24  3.  Patient will be able to ambulate 600' w/o AD and normal gait pattern without increased pain to access community.  Baseline: very limited ambulation tolerance  Goal status: IN PROGRESS  4. Patient will report >/= 35/80 on LEFS (MCID = 9 pts) to demonstrate improved functional ability. Baseline: 22 / 80 = 27.5 % Goal status: INITIAL  5.  Patient will demonstrate at least 19/30 on FGA to decrease risk of falls. Baseline: 08/01/24 - 7/30; < 19 = high risk fall  Goal status: IN PROGRESS- 09/06/24   6.  Patient will improve Berg score by at least 8 points to improve safety and stability with ADLs in standing and reduce risk for falls Baseline: 08/01/24 - 35/56; < 36 high risk for falls (close to 100%) Goal status: MET- 09/06/24   PLAN:  PT FREQUENCY: 2x/week  PT DURATION: 12 weeks  PLANNED INTERVENTIONS: 97164- PT Re-evaluation, 97750- Physical  Performance Testing, 97110-Therapeutic exercises, 97530- Therapeutic activity, 97112- Neuromuscular re-education, 97535- Self Care, [id number]- Manual therapy, [id number]- Gait training, [phone]- Aquatic Therapy, [phone]- Electrical stimulation (unattended), [id number]- Ultrasound, [id number]- Ionotophoresis 4mg /ml Dexamethasone , 79439 (1-2 muscles), 20561 (3+ muscles)- Dry Needling, Patient/Family education, Balance training, Stair training, Taping, Vestibular training, DME instructions, Cryotherapy, and Moist heat  PLAN FOR NEXT SESSION: dynamic balance activities as tolerated; LE stengthening   Sol LITTIE Gaskins, PTA 09/10/2024, 12:06 PM

## 2024-09-11 ENCOUNTER — Other Ambulatory Visit: Payer: Self-pay

## 2024-09-12 ENCOUNTER — Ambulatory Visit: Payer: Self-pay | Admitting: Physical Therapy

## 2024-09-12 ENCOUNTER — Encounter: Payer: Self-pay | Admitting: Physical Therapy

## 2024-09-12 DIAGNOSIS — R262 Difficulty in walking, not elsewhere classified: Secondary | ICD-10-CM

## 2024-09-12 DIAGNOSIS — M6281 Muscle weakness (generalized): Secondary | ICD-10-CM

## 2024-09-12 DIAGNOSIS — R2681 Unsteadiness on feet: Secondary | ICD-10-CM

## 2024-09-12 NOTE — Therapy (Addendum)
 OUTPATIENT PHYSICAL THERAPY TREATMENT  Progress Note  Reporting Period 07/30/2024 to 09/12/2024   See note below for Objective Data and Assessment of Progress/Goals.     Patient Name: Ariana Herrera MRN: [id number] DOB: 03/27/1966, 58 y.o., female Today's Date: 09/12/2024   END OF SESSION:  PT End of Session - 09/12/24 1015     Visit Number 10    Date for Recertification  10/22/24    Authorization Type [facility] Medicaid    Authorization Time Period auth pending   prior auth required after 08/25/24   Authorization - Visit Number 3   since 08/25/24   Progress Note Due on Visit 20    PT Start Time 1015    PT Stop Time 1103    PT Time Calculation (min) 48 min    Activity Tolerance Patient tolerated treatment well;Patient limited by fatigue    Behavior During Therapy [facility] for tasks assessed/performed              Past Medical History:  Diagnosis Date   Anginal pain    Anxiety    Arthritis    Asthma    Bipolar 1 disorder (HCC)    Bipolar disease, chronic (HCC)    Breast cancer in female (HCC)    2019 and recurrent in 2023 to right breast, right axillary, and left neck   Coronary artery disease    Deep vein blood clot of right lower extremity (HCC)    Diabetes mellitus type 2 in obese    Goals of care, counseling/discussion 11/23/2021   History of external beam radiation therapy    Left neck-10/07/23-11/24/23-[facility]   Hyperlipidemia    Lymphedema    MI (myocardial infarction) (HCC)    was in [place]    Obesity    Pancreatitis    Personal history of chemotherapy    Personal history of radiation therapy    Sleep apnea    Past Surgical History:  Procedure Laterality Date   BREAST BIOPSY Left 10/03/2023   times 2   BREAST LUMPECTOMY Left 03/2019   CATARACT EXTRACTION Bilateral    CHOLECYSTECTOMY     IR IMAGING GUIDED PORT INSERTION  12/15/2021   IR PATIENT EVAL TECH 0-60 MINS  10/06/2023   IR REMOVAL TUN ACCESS W/ PORT W/O FL MOD SED  12/20/2019   IR  US  GUIDE BX ASP/DRAIN  11/02/2021   LEFT HEART CATH AND CORONARY ANGIOGRAPHY N/A 08/22/2020   Procedure: LEFT HEART CATH AND CORONARY ANGIOGRAPHY;  Surgeon: Verlin Lonni BIRCH, MD;  Location: [facility];  Service: Cardiovascular;  Laterality: N/A;   TRIGGER FINGER RELEASE Bilateral    x 5   TUBAL LIGATION     UMBILICAL HERNIA REPAIR     x 2   uterine ablation     Patient Active Problem List   Diagnosis Date Noted   IDA (iron deficiency anemia) 08/27/2024   Coronary artery disease involving native coronary artery of native heart without angina pectoris 08/16/2024   Internal jugular (IJ) vein thromboembolism, acute (HCC) 10/06/2023   Facial swelling 10/05/2023   Tobacco use 07/14/2023   Type 2 diabetes mellitus with hyperglycemia, with long-term current use of insulin  (HCC) 06/23/2023   Stress incontinence of urine 06/23/2023   Moderate persistent asthma without complication 06/23/2023   Obesity (BMI 30-39.9) 06/23/2023   Encounter for orthopedic follow-up care 09/23/2021   Acquired trigger finger of left middle finger 08/27/2021   Chest pain of uncertain etiology    History of MI (myocardial  infarction) 08/20/2020   History of pancreatitis 08/20/2020   Hyperlipidemia associated with type 2 diabetes mellitus (HCC) 08/20/2020   Bipolar disease, chronic (HCC)    Breast cancer (HCC) 11/26/2019   Chemotherapy-induced neuropathy 11/26/2019   History of DVT (deep vein thrombosis) 11/26/2019    PCP: Cyndi Shaver, PA-C (Inactive)   REFERRING PROVIDER: Timmy Maude SAUNDERS, MD   REFERRING DIAG: [phone] (ICD-10-CM) - Malignant neoplasm involving both nipple and areola of left breast in female, unspecified estrogen receptor status (HCC)   Per MD order: She is a bit deconditioned. She has lost weight. She does have breast cancer. I think she just needs some strengthening exercises for her legs.   THERAPY DIAG:  Muscle weakness (generalized)  Difficulty in walking, not  elsewhere classified  Unsteadiness on feet  RATIONALE FOR EVALUATION AND TREATMENT: Rehabilitation  ONSET DATE: ~Dec 2024  NEXT MD VISIT: 09/14/24 with Lauraine Pepper, NP   SUBJECTIVE:                                                                                                                                                                                                         SUBJECTIVE STATEMENT: No neck pain today.  The dizziness and nausea are still off and on but not as bad as it was.  Now able to walk through store w/o using electric shopping cart or having to hold onto something.  EVAL: Pt reports her cancer returned in Dec 2024 and ever since she has been losing ~10# per month and she feels like her muscles have gone with the weight.  Now has difficulty with housework and working the yard or garden.  Unable to go shopping w/o using an electric cart. Also reports nerve pain at times.  She is in remission but does have terminal metastatic breast cancer, currently taking oral chemo - 3 weeks on/1 week off.  She states her blood counts are currently low.  PAIN: Are you having pain? No and Yes: NPRS scale: 0/10  Pain location: neck  PERTINENT HISTORY:  Angina; anxiety; arthritis - knee, hips, back and neck (4 bulging discs in neck); asthma; bipolar 1; breast cancer 2019 and recurrent 2023 to right breast, right axillary, and left neck; R LE DVT; DM-II; HLD; MI; pancreatitis; dizziness  PRECAUTIONS: None  RED FLAGS: None  WEIGHT BEARING RESTRICTIONS: No  FALLS:  Has patient fallen in last 6 months? No  LIVING ENVIRONMENT: Lives with: lives with their spouse and lives with their son Lives in: Mobile home - 5th wheel Stairs: Yes: External: 3 steps; on  left going up Has following equipment at home: Single point cane, Walker - 2 wheeled, Wheelchair (manual), and shower chair  OCCUPATION: On disability  PLOF: Independent with basic ADLs, Needs assistance with homemaking,  and Leisure: gardening, travel - unable to participate in leisure activities right now  PATIENT GOALS: Be able to stand longer periods of time - get strength back in my legs. Walk around easier.   OBJECTIVE: (objective measures completed at initial evaluation unless otherwise dated)  DIAGNOSTIC FINDINGS:  06/11/24 - CT ANGIOGRAPHY CHEST WITH CONTRAST IMPRESSION: 1. Tiny subsegmental pulmonary embolism in a posterior left lower lobe pulmonary artery. No evidence of right heart strain. 2. Small pericardial effusion. 3. Postsurgical changes in the left breast with diffuse dermal and trabecular thickening similar to prior. Findings in the neck reported separately. 4. Unchanged vague nodularity in the left supraclavicular region. 5. Aortic Atherosclerosis (ICD10-I70.0).   06/03/24 - MR NECK WITH AND WITHOUT INTRAVENOUS CONTRAST IMPRESSION: 1. No acute findings within the soft tissues of the neck. 2. Mild to moderate degenerative changes in the cervical spine as described above. No abnormal enhancement of the spinal cord or nerve roots. 3. Interval resolution of left supraclavicular lymphadenopathy and soft tissue stranding.  03/02/24 - MRI HEAD WITHOUT AND WITH CONTRAST IMPRESSION: No acute intracranial abnormality. No findings to suggest intracranial metastatic disease.   Punctate susceptibility in the right parietal white matter slightly more conspicuous on current study likely due to differences in technique. No associated edema or enhancement. Findings likely reflect chronic microhemorrhages. Recommend attention on follow-up.   Similar 7 mm dural-based enhancing lesion over the left middle frontal gyrus suggestive of a small meningioma.   Trace mastoid effusions.  PATIENT SURVEYS:  LEFS  Extreme difficulty/unable (0), Quite a bit of difficulty (1), Moderate difficulty (2), Little difficulty (3), No difficulty (4) Survey date:  07/30/24 09/10/24 09/12/24  Any of your usual  work, housework or school activities 1  2  2. Usual hobbies, recreational or sporting activities 0  1  3. Getting into/out of the bath 1  4  4. Walking between rooms 2  4  5. Putting on socks/shoes 4  4  6. Squatting  0  0  7. Lifting an object, like a bag of groceries from the floor 2  2  8. Performing light activities around your home 1  4  9. Performing heavy activities around your home 0  2  10. Getting into/out of a car 4  4  11. Walking 2 blocks 0  0  12. Walking 1 mile 0  0  13. Going up/down 10 stairs (1 flight) 1  3  14. Standing for 1 hour 3  0  15.  sitting for 1 hour 0  4  16. Running on even ground 0  0  17. Running on uneven ground 0  0  18. Making sharp turns while running fast 0  0  19. Hopping  1  4  20. Rolling over in bed 2  4  Score total:  22 / 80 = 27.5 % 37 / 80 = 46.3 % 42 / 80 = 52.5 %  Functional limitation:   moderate       COGNITION: Overall cognitive status: Impaired (Chemo brain)  SENSATION: Chemo-based neuropathy in B hands and feet; nerve pain in legs  EDEMA:  Fluctuating edema in feet  POSTURE:  No Significant postural limitations  MUSCLE LENGTH: Hamstrings: mild tight B Piriformis: mod/severe tight R, mild tight L  LOWER  EXTREMITY ROM: Grossly WFL other than limitation in hip rotation R>L  LOWER EXTREMITY MMT:  MMT Right eval Left eval R 08/22/24 L 08/22/24 R 09/12/24 L 09/12/24  Hip flexion 4- 4- 4+ 4+ 4+ 4+  Hip extension 3+ 4- 4 4 4+ 4+  Hip abduction 3+ 3+ 4+ 4 4 4   Hip adduction 3- 3- 4 4 4  4+  Hip internal rotation 4 4 4  4+ 4+ 4+  Hip external rotation 4- 4- 4+ 4 4 4   Knee flexion 4- 4- 4 4+ 4+ 4+  Knee extension 4 4 4+ 5 4+ 5  Ankle dorsiflexion 4 4- 4+ 4+ 4+ 4+  Ankle plantarflexion 3 (4 SLS HR) 3 (4 SLS HR) 9 HR 12 HR 15 HR 12 HR  Ankle inversion        Ankle eversion         (Blank rows = not tested)  FUNCTIONAL TESTS:  5 times sit to stand: 30.87 sec (several tries/attempts necessary on some reps) Timed up  and go (TUG): 13.84 sec 10 meter walk test: 11.32 sec  Gait speed: 2.90 ft/sec Berg Balance Scale: 08/01/24 - 35/56; < 36 high risk for falls (close to 100%) Functional gait assessment: 08/01/24 - 7/30; < 19 = high risk fall  Interpretation of scores: Non-Specific Older Adults Cutoff Score: <=22/30 = risk of falls Parkinson's Disease Cutoff score <15/30= fall risk (Hoehn & Yahr 1-4)  Minimally Clinically Important Difference (MCID)  Stroke (acute, subacute, and chronic) = MDC: 4.2 points Vestibular (acute) = MDC: 6 points Community Dwelling Older Adults =  MCID: 4 points Parkinson's Disease  =  MDC: 4.3 points  (Academy of Neurologic Physical Therapy (nd). Functional Gait Assessment. Retrieved from https://www.neuropt.org/docs/default-source/cpgs/core-outcome-measures/function-gait-assessment-pocket-guide-proof9-(2).pdf?sfvrsn=b74f35043_0.)  GAIT: Distance walked: clinic distances Assistive device utilized: None Level of assistance: Complete Independence and SBA Gait pattern: decreased gait velocity, decreased stride length, lateral hip instability, and wide BOS   TODAY'S TREATMENT:    09/12/2024 - Progress Note THERAPEUTIC EXERCISE: To improve strength and endurance.  Demonstration, verbal and tactile cues throughout for technique.  NuStep - L4 x 6' - UE/LE   258' walking in hallway w/o AD before need for seated rest break, slight tendency to veer to R  THERAPEUTIC ACTIVITIES: To improve functional performance.  Demonstration, verbal and tactile cues throughout for technique. LEFS: 42 / 80 = 52.5 % LE MMT Goal assessment  NEUROMUSCULAR RE-EDUCATION: To improve balance, coordination, kinesthesia, posture, proprioception, and reduce fall risk. SLS + 4-way RTB SLR x 10 each direction for B LE, chair available for UE support PRN but minimal use (1 self-corrected LOB with R hip ABD)   09/10/2024  THERAPEUTIC EXERCISE: To improve strength and endurance.  Demonstration, verbal and  tactile cues throughout for technique.  NuStep - L3 x 6 min  Gait in hallway- 444ft veers off to the sides at times, noted feeling swimmy x 2 Pulse checked manually after gait- 100 BPM NEUROMUSCULAR RE-EDUCATION: To improve coordination, kinesthesia, proprioception All exercises w/ TrA contraction: Supine bent knee fallouts GTB 2x10 Supine march GTB 2x10 Bridges GTB x 10 B shoulder ER YTB x 10 B shoulder horizontal ABD YTB x 10 Chin tucks x 10 B   09/06/2024  THERAPEUTIC EXERCISE: To improve strength and endurance.  Demonstration, verbal and tactile cues throughout for technique.  NuStep - L3 x 6 min   NEUROMUSCULAR RE-EDUCATION: To improve balance, coordination, kinesthesia, proprioception, and reduce fall risk.  Berg Balance Test   Sit to Stand Able to stand  without using hands and stabilize independently    Standing Unsupported Able to stand safely 2 minutes    Sitting with Back Unsupported but Feet Supported on Floor or Stool Able to sit safely and securely 2 minutes    Stand to Sit Sits safely with minimal use of hands    Transfers Able to transfer safely, minor use of hands    Standing Unsupported with Eyes Closed Able to stand 10 seconds safely    Standing Unsupported with Feet Together Able to place feet together independently and stand 1 minute safely    From Standing, Reach Forward with Outstretched Arm Can reach confidently >25 cm (10)    From Standing Position, Pick up Object from Floor Able to pick up shoe safely and easily    From Standing Position, Turn to Look Behind Over each Shoulder Looks behind from both sides and weight shifts well    Turn 360 Degrees Able to turn 360 degrees safely in 4 seconds or less    Standing Unsupported, Alternately Place Feet on Step/Stool Able to stand independently and safely and complete 8 steps in 20 seconds    Standing Unsupported, One Foot in Front Needs help to step but can hold 15 seconds    Standing on One Leg Tries to lift  leg/unable to hold 3 seconds but remains standing independently    Total Score 50      Functional Gait  Assessment   Gait Level Surface Walks 20 ft, slow speed, abnormal gait pattern, evidence for imbalance or deviates 10-15 in outside of the 12 in walkway width. Requires more than 7 sec to ambulate 20 ft.    Change in Gait Speed Able to smoothly change walking speed without loss of balance or gait deviation. Deviate no more than 6 in outside of the 12 in walkway width.    Gait with Horizontal Head Turns Performs head turns with moderate changes in gait velocity, slows down, deviates 10-15 in outside 12 in walkway width but recovers, can continue to walk.    Gait with Vertical Head Turns Performs task with slight change in gait velocity (eg, minor disruption to smooth gait path), deviates 6 - 10 in outside 12 in walkway width or uses assistive device    Gait and Pivot Turn Pivot turns safely within 3 sec and stops quickly with no loss of balance.    Step Over Obstacle Is able to step over 2 stacked shoe boxes taped together (9 in total height) without changing gait speed. No evidence of imbalance.    Gait with Narrow Base of Support Ambulates less than 4 steps heel to toe or cannot perform without assistance.    Gait with Eyes Closed Walks 20 ft, slow speed, abnormal gait pattern, evidence for imbalance, deviates 10-15 in outside 12 in walkway width. Requires more than 9 sec to ambulate 20 ft.    Ambulating Backwards Walks 20 ft, slow speed, abnormal gait pattern, evidence for imbalance, deviates 10-15 in outside 12 in walkway width.    Steps Two feet to a stair, must use rail.    Total Score 16     *All standing exercises with UE support at counter for balance. Tandem gait to end; retro gait to other end 3x each Gait with head turns horiz 2x, vertical 2x   08/22/2024  THERAPEUTIC EXERCISE: To improve strength and endurance.  Demonstration, verbal and tactile cues throughout for technique.   NuStep - L3 x 6 min  Tested LE strength  THERAPEUTIC ACTIVITIES: To improve strength and functional performance.  Demonstration, verbal and tactile cues throughout for technique. Standing hip ABD with looped YTB at ankles x 10 bil Standing hip extension with looped YTB at ankles x 10 bil Standing hip flexion march with looped YTB at midfeet x 10 bil Standing heel toe raises 2 x 10 Sidesteps YTB at ankles 4x10 along counter   *All standing exercises with UE support at counter for balance.  NEUROMUSCULAR RE-EDUCATION: To improve balance, coordination, kinesthesia, proprioception, and reduce fall risk. Standing on Airex pad: Weight shifting x 10 fwd/back Step to floor back to airex 2x5 BLE Toe clears to 9 stool 2x5 BLE; x 10 additional reps BLE  08/17/2024  THERAPEUTIC EXERCISE: To improve strength and endurance.  Demonstration, verbal and tactile cues throughout for technique.  NuStep - L3 x 6 min  Seated LAQ with looped YTB at ankles 2 x 10  THERAPEUTIC ACTIVITIES: To improve strength and functional performance.  Demonstration, verbal and tactile cues throughout for technique. Standing hip ABD with looped YTB at ankles x 10 bil Standing hip extension with looped YTB at ankles x 10 bil Standing hip flexion march with looped YTB at midfeet x 10 bil Standing heel toe raises x 10 *All standing exercises with UE support on wall ladder for balance.  NEUROMUSCULAR RE-EDUCATION: To improve balance, coordination, kinesthesia, proprioception, and reduce fall risk. Standing on Airex pad: Weight shifting Marching in place Toe clears to 9 stool   08/15/24 Nustep L3x30min UE/LE Supine DKTC orange ball x 20 Leg curls 10lb x 10 BLE- lightning bolt feeling in L calf Leg ext 5lb 2x10 BLE Seated hip ABD RTB x 10 Seated marching RTB x 10  NEUROMUSCULAR RE-EDUCATION: 5xSTS- 17.37 seconds Standing on airex:  Narrow BOS balance x 1 min  EC x 1 min  Normal BOS w/ head turns x5; trunk  rotations x 5  Heel/toe raises x 10 B Bridges 2x10 orange pball   08/10/24 NEUROMUSCULAR RE-EDUCATION: Tandem gait along counter 2x down/back intermittent support Standing on airex:  Normal BOS balance x 1 min- wobbly  Narrow BOS balance x 1 min- wobbly  EC x 1 min  Normal BOS w/ head turns x5; trunk rotations x 5  THERAPEUTIC EXERCISE:  UBE L1.0 3 min fwd/back Bridges with PPT 2x10 Supine feet on orange pball:  DKTC x 10  Trunk rotation x 10 B  Iso HS curl + PPT x 10 B  THERAPEUTIC ACTIVITIES:  Standing hip abduction x 10 BLE Standing hip extension x 10 BLE Standing marching x 10 BLE Standing heel/toe raises x 10 BLE Sit to stands 2x5   08/08/24 NEUROMUSCULAR RE-EDUCATION: To improve coordination, kinesthesia, posture, and proprioception.  Standing heel/toe raises x 10 B- needed to sit after each exercise reporting dizziness for ~2-3 seconds Seated ab sets with beach ball 2x10 Seated B Shoulder ER and hip ER 2x10 Tandem stance x 30 BLE Clock balance R/L 12 to 6 o'clock 1x around Supine PPT 2x10 Supine marches with PPT x 10 B Bridges x 10  THERAPEUTIC EXERCISE: To improve strength, endurance, ROM, and flexibility Nustep L2x57min Brief HEP review LTR both ways x 10 Supine SLR x 10 BLE Supine SKTC stretch x 30 R/L Supine figure 4 stretch x 30 R/L   08/01/2024  PHYSICAL PERFORMANCE TEST or MEASUREMENT:  Berg Balance Test   Sit to Stand Able to stand  independently using hands    Standing Unsupported Able to stand 30 seconds unsupported   had  to stop due to feeling dizzy   Sitting with Back Unsupported but Feet Supported on Floor or Stool Able to sit safely and securely 2 minutes    Stand to Sit Uses backs of legs against chair to control descent    Transfers Able to transfer safely, definite need of hands    Standing Unsupported with Eyes Closed Able to stand 10 seconds with supervision    Standing Unsupported with Feet Together Able to place feet together  independently but unable to hold for 30 seconds    From Standing, Reach Forward with Outstretched Arm Can reach confidently >25 cm (10)    From Standing Position, Pick up Object from Floor Able to pick up shoe, needs supervision    From Standing Position, Turn to Look Behind Over each Shoulder Turn sideways only but maintains balance    Turn 360 Degrees Needs close supervision or verbal cueing    Standing Unsupported, Alternately Place Feet on Step/Stool Able to stand independently and complete 8 steps >20 seconds    Standing Unsupported, One Foot in Front Needs help to step but can hold 15 seconds    Standing on One Leg Able to lift leg independently and hold equal to or more than 3 seconds    Total Score 35    Berg comment: < 36 high risk for falls (close to 100%)      Functional Gait  Assessment   Gait Level Surface Walks 20 ft, slow speed, abnormal gait pattern, evidence for imbalance or deviates 10-15 in outside of the 12 in walkway width. Requires more than 7 sec to ambulate 20 ft.    Change in Gait Speed Makes only minor adjustments to walking speed, or accomplishes a change in speed with significant gait deviations, deviates 10-15 in outside the 12 in walkway width, or changes speed but loses balance but is able to recover and continue walking.    Gait with Horizontal Head Turns Performs head turns with moderate changes in gait velocity, slows down, deviates 10-15 in outside 12 in walkway width but recovers, can continue to walk.    Gait with Vertical Head Turns Performs task with moderate change in gait velocity, slows down, deviates 10-15 in outside 12 in walkway width but recovers, can continue to walk.    Gait and Pivot Turn Turns slowly, requires verbal cueing, or requires several small steps to catch balance following turn and stop    Step Over Obstacle Is able to step over one shoe box (4.5 in total height) but must slow down and adjust steps to clear box safely. May require verbal  cueing.    Gait with Narrow Base of Support Ambulates less than 4 steps heel to toe or cannot perform without assistance.    Gait with Eyes Closed Cannot walk 20 ft without assistance, severe gait deviations or imbalance, deviates greater than 15 in outside 12 in walkway width or will not attempt task.    Ambulating Backwards Walks 20 ft, slow speed, abnormal gait pattern, evidence for imbalance, deviates 10-15 in outside 12 in walkway width.    Steps Cannot do safely.    Total Score 7    FGA comment: < 19 = high risk fall       THERAPEUTIC EXERCISE: To improve strength and endurance.  Demonstration, verbal and tactile cues throughout for technique.  Seated heel toe raises x 10 Seated hip ADD isometrics with ball 10 x 5 Seated LAQ x 10 Seated YTB hip flexion march  x 10 Seated B hip ABD with looped YTB at distal thighs x 10 - better tolerated isolating 1 leg at a time   07/30/2024  SELF CARE:  Reviewed eval findings and role of PT in addressing identified deficits as well as instruction in initial HEP (see below).    PATIENT EDUCATION:  Education details: HEP progression - YTB resistance added to standing exercises and seated LAQ  Person educated: Patient Education method: Explanation, Demonstration, Verbal cues, and Handouts Education comprehension: verbalized understanding, returned demonstration, verbal cues required, and needs further education  HOME EXERCISE PROGRAM: Access Code: [email or web address] URL: [email or web address] Date: 09/06/2024 Prepared by: Braylin Clark  Exercises - Seated March with Resistance  - 1 x daily - 5-7 x weekly - 1-2 sets - 8-10 reps - 3 sec hold - Seated Hip Abduction with Resistance  - 1 x daily - 5-7 x weekly - 1-2 sets - 8-10 reps - 3-5 sec hold - Seated Hip Adduction Isometrics with Ball  - 1 x daily - 5-7 x weekly - 1-2 sets - 8-10 reps - 3-5 sec hold - Standing Hip Abduction with Resistance at Ankles and Counter Support  - 1 x daily - 3-4  x weekly - 2 sets - 10 reps - 3 sec hold - Standing Hip Extension with Resistance at Ankles and Counter Support  - 1 x daily - 3-4 x weekly - 2 sets - 10 reps - 3 sec hold - Marching with Resistance  - 1 x daily - 3-4 x weekly - 2 sets - 10 reps - 3 sec hold - Seated Knee Extension with Resistance  - 1 x daily - 3-4 x weekly - 2 sets - 10 reps - 3 sec hold - Tandem Walking with Counter Support  - 1 x daily - 7 x weekly - 3 sets - 10 reps - Backward Walking with Counter Support  - 1 x daily - 7 x weekly - 3 sets - 10 reps   ASSESSMENT:  CLINICAL IMPRESSION: Dorota Heinrichs is pleases with her progress with PT thus far.  She feels like her strength is coming back and notes she is able to do things like walk around a store with having to hold on or use the electric cart now.  LEFS demonstrates improving function with score improved from 22/80 to 42/80, indicating ongoing mild to moderate functional limitation.  Walking tolerance does remain limited by fatigue, with patient requiring a seated rest break after ambulating 258' today.  She has demonstrated good gains throughout B LE strength, however continued mild proximal LE weakness still present. Significant improvement observed with recent standardized balance testing.  Berg improved from 35/56 to 50/56 indicating only a low ongoing fall risk with static activities.  FGA also improved from 7/30 on eval to 16/30 on recent testing, however this is still indicative of a high fall risk.  Remaining therapeutic interventions today focusing continued proximal LE strengthening with increased balance focus to address ongoing deficits indicated above.  Perry is progressing well toward her PT goals and will benefit from continued skilled PT to address ongoing strength and dynamic balance deficits to improve mobility and activity tolerance with decreased risk for falls.    EVAL: KANIYAH LISBY is a 58 y.o. female who was referred to physical therapy for  evaluation and treatment for LE weakness and deconditioning 2 metastatic breast cancer.  She reports she experienced a recurrence of her cancer in December 2024 and ever since she has been losing ~10#  per month and she feels like some of the weight loss has been related to muscle mass loss leading to generalized weakness and poor endurance with ADLs, daily mobility and household tasks.  Her energy levels are also impacted by low blood counts and she also experiences chemo-induced peripheral neuropathy as well as intermittent nerve pain.  Patient has deficits in hip ROM, B LE flexibility, B LE strength, and impaired balance and activity tolerance which are interfering with ADLs and are impacting quality of life.  On LEFS patient scored 22/80 demonstrating moderate functional limitation.  Shariya Gaster will benefit from skilled PT to address above deficits to improve mobility and activity tolerance with decreased pain interference.  OBJECTIVE IMPAIRMENTS: Abnormal gait, cardiopulmonary status limiting activity, decreased activity tolerance, decreased endurance, decreased knowledge of use of DME, decreased mobility, difficulty walking, decreased strength, decreased safety awareness, dizziness, impaired flexibility, impaired sensation, postural dysfunction, and pain.   ACTIVITY LIMITATIONS: carrying, lifting, bending, sitting, standing, squatting, stairs, transfers, bed mobility, bathing, dressing, hygiene/grooming, and locomotion level  PARTICIPATION LIMITATIONS: meal prep, cleaning, laundry, driving, shopping, community activity, and yard work  PERSONAL FACTORS: Fitness, Past/current experiences, Time since onset of injury/illness/exacerbation, and 3+ comorbidities: Angina; anxiety; arthritis - knee, hips, back and neck (4 bulging discs in neck); asthma; bipolar 1; breast cancer 2019 and recurrent 2023 to right breast, right axillary, and left neck; R LE DVT; DM-II; HLD; MI; pancreatitis; dizziness are  also affecting patient's functional outcome.   REHAB POTENTIAL: Good  CLINICAL DECISION MAKING: Unstable/unpredictable  EVALUATION COMPLEXITY: High   GOALS: Goals reviewed with patient? Yes  SHORT TERM GOALS: Target date: 09/10/2024  Patient will be independent with initial HEP. Baseline: Initial HEP provided on eval 08/01/24 - reviewed initial HEP Goal status: MET - 08/17/24  2.  Complete standardized balance testing and update LTG's accordingly. Baseline: Berg & FGA pending Goal status: MET - 08/01/24  3.  Patient will improve 5x STS time to </= 24 seconds for improved efficiency and safety with transfers. Baseline: 30.87 sec Goal status: MET - 08/15/24 - 17.37 sec  LONG TERM GOALS: Target date: 10/22/2024  Patient will be independent with advanced/ongoing HEP to improve outcomes and carryover.  Baseline:  Goal status: IN PROGRESS - 09/12/24 - Met for current HEP  2.  Patient will demonstrate improved B LE strength to >/= 4 to 4+/5 for improved stability and ease of mobility. Baseline: Refer to above LE MMT table 08/22/24 - goal met Goal status: MET - 09/12/24 - strength continues to improve  3.  Patient will be able to ambulate 600' w/o AD and normal gait pattern without increased pain to access community.  Baseline: very limited ambulation tolerance  Goal status: IN PROGRESS - 09/12/24 - 258' before seated rest break needed due to LE weakness, slight tendency to veer to R  4. Patient will report >/= 35/80 on LEFS (MCID = 9 pts) to demonstrate improved functional ability. Baseline: 22 / 80 = 27.5 % 09/10/24 - 37 / 80 = 46.3 % Goal status: MET - 09/12/24 - 42 / 80 = 52.5 %  5.  Patient will demonstrate at least 19/30 on FGA to decrease risk of falls. Baseline: 08/01/24 - 7/30; < 19 = high risk fall  Goal status: IN PROGRESS - 09/06/24 - 16/30  6.  Patient will improve Berg score by at least 8 points to improve safety and stability with ADLs in standing and reduce  risk for falls Baseline: 08/01/24 - 35/56; < 36 high risk  for falls (close to 100%) Goal status: MET - 09/06/24 - 50/56   PLAN:  PT FREQUENCY: 2x/week  PT DURATION: 12 weeks  PLANNED INTERVENTIONS: 97164- PT Re-evaluation, 97750- Physical Performance Testing, 97110-Therapeutic exercises, 97530- Therapeutic activity, 97112- Neuromuscular re-education, 97535- Self Care, 02859- Manual therapy, [id number]- Gait training, [phone]- Aquatic Therapy, [phone]- Electrical stimulation (unattended), [id number]- Ultrasound, [id number]- Ionotophoresis 4mg /ml Dexamethasone , 79439 (1-2 muscles), 20561 (3+ muscles)- Dry Needling, Patient/Family education, Balance training, Stair training, Taping, Vestibular training, DME instructions, Cryotherapy, and Moist heat  PLAN FOR NEXT SESSION: Proximal LE strengthening; progression of dynamic balance and endurance activities as tolerated   Elijah CHRISTELLA Hidden, PT 09/12/2024, 12:00 PM

## 2024-09-13 ENCOUNTER — Ambulatory Visit: Admitting: Family Medicine

## 2024-09-14 ENCOUNTER — Encounter: Payer: Self-pay | Admitting: Family

## 2024-09-14 ENCOUNTER — Inpatient Hospital Stay (HOSPITAL_BASED_OUTPATIENT_CLINIC_OR_DEPARTMENT_OTHER): Admitting: Family

## 2024-09-14 ENCOUNTER — Inpatient Hospital Stay

## 2024-09-14 VITALS — BP 102/53 | HR 99 | Temp 98.6°F | Resp 20 | Ht 62.0 in | Wt 140.0 lb

## 2024-09-14 DIAGNOSIS — D509 Iron deficiency anemia, unspecified: Secondary | ICD-10-CM | POA: Diagnosis not present

## 2024-09-14 DIAGNOSIS — D649 Anemia, unspecified: Secondary | ICD-10-CM

## 2024-09-14 DIAGNOSIS — C50012 Malignant neoplasm of nipple and areola, left female breast: Secondary | ICD-10-CM | POA: Diagnosis not present

## 2024-09-14 DIAGNOSIS — Z86718 Personal history of other venous thrombosis and embolism: Secondary | ICD-10-CM

## 2024-09-14 DIAGNOSIS — E611 Iron deficiency: Secondary | ICD-10-CM | POA: Diagnosis not present

## 2024-09-14 LAB — CBC WITH DIFFERENTIAL (CANCER CENTER ONLY)
Abs Immature Granulocytes: 0.01 K/uL (ref 0.00–0.07)
Basophils Absolute: 0 K/uL (ref 0.0–0.1)
Basophils Relative: 0 %
Eosinophils Absolute: 0 K/uL (ref 0.0–0.5)
Eosinophils Relative: 0 %
HCT: 30.5 % — ABNORMAL LOW (ref 36.0–46.0)
Hemoglobin: 10.2 g/dL — ABNORMAL LOW (ref 12.0–15.0)
Immature Granulocytes: 0 %
Lymphocytes Relative: 27 %
Lymphs Abs: 0.6 K/uL — ABNORMAL LOW (ref 0.7–4.0)
MCH: 36.6 pg — ABNORMAL HIGH (ref 26.0–34.0)
MCHC: 33.4 g/dL (ref 30.0–36.0)
MCV: 109.3 fL — ABNORMAL HIGH (ref 80.0–100.0)
Monocytes Absolute: 0.3 K/uL (ref 0.1–1.0)
Monocytes Relative: 11 %
Neutro Abs: 1.4 K/uL — ABNORMAL LOW (ref 1.7–7.7)
Neutrophils Relative %: 62 %
Platelet Count: 116 K/uL — ABNORMAL LOW (ref 150–400)
RBC: 2.79 MIL/uL — ABNORMAL LOW (ref 3.87–5.11)
RDW: 15.5 % (ref 11.5–15.5)
WBC Count: 2.3 K/uL — ABNORMAL LOW (ref 4.0–10.5)
nRBC: 0 % (ref 0.0–0.2)

## 2024-09-14 LAB — CMP (CANCER CENTER ONLY)
ALT: 13 U/L (ref 0–44)
AST: 15 U/L (ref 15–41)
Albumin: 4.2 g/dL (ref 3.5–5.0)
Alkaline Phosphatase: 92 U/L (ref 38–126)
Anion gap: 11 (ref 5–15)
BUN: 20 mg/dL (ref 6–20)
CO2: 26 mmol/L (ref 22–32)
Calcium: 9.9 mg/dL (ref 8.9–10.3)
Chloride: 100 mmol/L (ref 98–111)
Creatinine: 0.88 mg/dL (ref 0.44–1.00)
GFR, Estimated: >60 mL/min (ref >60)
Glucose, Bld: 234 mg/dL — ABNORMAL HIGH (ref 70–99)
Potassium: 3.8 mmol/L (ref 3.5–5.1)
Sodium: 136 mmol/L (ref 135–145)
Total Bilirubin: 0.3 mg/dL (ref 0.0–1.2)
Total Protein: 6.8 g/dL (ref 6.5–8.1)

## 2024-09-14 LAB — LACTATE DEHYDROGENASE: LDH: 127 U/L (ref 105–235)

## 2024-09-14 LAB — FERRITIN: Ferritin: 810 ng/mL — ABNORMAL HIGH (ref 11–307)

## 2024-09-14 LAB — IRON AND IRON BINDING CAPACITY (CC-WL,HP ONLY)
Iron: 87 ug/dL (ref 28–170)
Saturation Ratios: 29 % (ref 10.4–31.8)
TIBC: 304 ug/dL (ref 250–450)
UIBC: 217 ug/dL

## 2024-09-14 LAB — SAMPLE TO BLOOD BANK

## 2024-09-14 NOTE — Progress Notes (Signed)
 Hematology and Oncology Follow Up Visit  Ariana Herrera [id number] 1966-09-16 58 y.o. 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 daily-start on 12/22/2023 -  changed to 0.50 mg po q day on 03/06/2024, now 3 weeks on/1 week off since 07/20/2024 B12 oral 1,000 mcg once daily    Interim History:  Ariana Herrera is here today for follow-up. She has noted a couple bumps at the 10 -11 o'clock position of the outer left breast. This is palpable/nontender on exam today and she has a pea sized lump below her left axilla. This is mobile and non tender.  She has hyperpigmentation of the left breast and some skin retraction from previous radiation unchanged from previous exam.  No other abnormalities noted on bilateral breast exam.  She states that she is taking her Talzenna  as prescribed.  She has intermittent hot flashes and night sweats.  No fever, chills, n/v, cough, rash, dizziness, SOB, chest pain, palpitations, abdominal pain or changes in bowel or bladder habits.  No swelling in her extremities.  Neuropathy in her hands and feet seems a little improved.  She is doing well in PT for strengthening and balance.  No falls or syncope reported.  Appetite and hydration are good. Weight is stable at 140 lbs.   ECOG Performance Status: 1 - Symptomatic but completely ambulatory  Medications:   Allergies as of 09/14/2024       Reactions   Lithium Other (See Comments)   Spinal fluid built up in brain   Dulaglutide  Nausea And Vomiting, Other (See Comments)   TRULICITY    Nitrofuran Derivatives Itching   Nitrofurantoin  Mono- MCR   Penicillins Other (See Comments)   UNKNOWN CHILDHOOD REACTION        Medication List        Accurate as of September 14, 2024  1:58 PM. If you have any questions, ask your nurse or doctor.          Accu-Chek Guide test strip Generic drug: glucose blood 3 (three) times daily.   albuterol  1.25 MG/3ML nebulizer solution Commonly known as: ACCUNEB  Take 1 ampule by nebulization every 6 (six) hours as needed for wheezing or shortness of breath.   albuterol  108 (90 Base) MCG/ACT inhaler Commonly known as: VENTOLIN  HFA Inhale 2 puffs into the lungs every 6 (six) hours as needed for wheezing or shortness of breath.   Dexcom G7 Sensor Misc Change sensor every 10 days   fenofibrate  145 MG tablet Commonly known as: TRICOR  Take 145 mg by mouth daily.   gabapentin  400 MG capsule Commonly known as: Neurontin  Take 1 capsule (400 mg total) by mouth 3 (three) times daily. What changed: when to take this   HYDROcodone -acetaminophen  7.5-325 MG tablet Commonly known as: NORCO Take 1 tablet by mouth every 6 (six) hours as needed for moderate pain (pain score 4-6).   lidocaine -prilocaine  cream  Commonly known as: EMLA  Apply 1 Application topically daily as needed.   Linzess  290 MCG Caps capsule Generic drug: linaclotide  TAKE 1 CAPSULE BY MOUTH DAILY BEFORE BREAKFAST.   lubiprostone  24 MCG capsule Commonly known as: AMITIZA  Take 1 capsule (24 mcg total) by mouth 2 (two) times daily with a meal.   metFORMIN  1000 MG tablet Commonly known as: GLUCOPHAGE  Take 1 tablet (1,000 mg total) by mouth in the morning and in the evening with meals.   nitroGLYCERIN  0.4 MG SL tablet Commonly known as: NITROSTAT  Place 1 tablet (0.4 mg total) under the  tongue every 5 (five) minutes as needed for chest pain.   NovoLOG  100 UNIT/ML injection Generic drug: insulin  aspart Inject into the skin as directed.   insulin  aspart 100 UNIT/ML injection Commonly known as: novoLOG  Use as directed via insulin  pump. Total daily dose 100 units.   OLANZapine  10 MG tablet Commonly known as: ZYPREXA  TAKE 1 TABLET BY MOUTH EVERYDAY AT BEDTIME   Omnipod 5 DexG7G6 Pods Gen 5 Misc Apply 1 pod every 3 days for insulin  administration.   ondansetron  8 MG disintegrating tablet Commonly known as: ZOFRAN -ODT Take 1 tablet (8 mg total) by mouth every 8 (eight) hours as needed for nausea or vomiting.   oxybutynin  5 MG 24 hr tablet Commonly known as: DITROPAN -XL TAKE 1 TABLET BY MOUTH EVERYDAY AT BEDTIME   pantoprazole  40 MG tablet Commonly known as: PROTONIX  Take 1 tablet (40 mg total) by mouth daily.   PEG 3350  17 g Pack Take 17 g by mouth daily as needed.   prochlorperazine  10 MG tablet Commonly known as: COMPAZINE  Take 1 tablet (10 mg total) by mouth every 6 (six) hours as needed for nausea or vomiting (Zofran  resumes 12/16).   QUEtiapine  400 MG tablet Commonly known as: SEROQUEL  Take 2 tablets (800 mg total) by mouth at bedtime.   rivaroxaban  10 MG Tabs tablet Commonly known as: XARELTO  Take 1 tablet (10 mg total) by mouth daily.   rosuvastatin  40 MG tablet Commonly known as: CRESTOR  Take 1 tablet (40 mg total) by mouth daily.   talazoparib tosylate  0.35 MG capsule Commonly known as: Talzenna  Take 1 capsule (0.35 mg total) by mouth daily. Take for 21 days on, then off for 7 days. Repeat every 28 days.        Allergies:  Allergies  Allergen Reactions   Lithium Other (See Comments)    Spinal fluid built up in brain   Dulaglutide  Nausea And Vomiting and Other (See Comments)    TRULICITY    Nitrofuran Derivatives Itching    Nitrofurantoin  Mono- MCR   Penicillins Other (See Comments)    UNKNOWN CHILDHOOD REACTION    Past Medical  History, Surgical history, Social history, and Family History were reviewed and updated.  Review of Systems: All other 10 point review of systems is negative.   Physical Exam:  height is 5' 2 (1.575 m) and weight is 140 lb 0.6 oz (63.5 kg). Her oral temperature is 98.6 F (37 C). Her blood pressure is 102/53 (abnormal) and her pulse is 99. Her respiration is 20 and oxygen saturation is 100%.   Wt Readings from Last 3 Encounters:  09/14/24 140 lb 0.6 oz (63.5 kg)  08/24/24 141 lb (64 kg)  08/21/24 143 lb 6.4 oz (65 kg)    Ocular: Sclerae unicteric, pupils equal, round and reactive to light Ear-nose-throat: Oropharynx clear, dentition fair Lymphatic: No cervical, supraclavicular or axillary adenopathy Lungs no rales or rhonchi, good excursion bilaterally Heart  regular rate and rhythm, no murmur appreciated Abd soft, nontender, positive bowel sounds MSK no focal spinal tenderness, no joint edema Neuro: non-focal, well-oriented, appropriate affect Breasts: Same as above.  Lab Results  Component Value Date   WBC 2.3 (L) 09/14/2024   HGB 10.2 (L) 09/14/2024   HCT 30.5 (L) 09/14/2024   MCV 109.3 (H) 09/14/2024   PLT 116 (L) 09/14/2024   Lab Results  Component Value Date   FERRITIN 65 08/24/2024   IRON 40 08/24/2024   TIBC 322 08/24/2024   UIBC 282 08/24/2024   IRONPCTSAT 12 08/24/2024   Lab Results  Component Value Date   RETICCTPCT 3.2 (H) 07/20/2024   RBC 2.79 (L) 09/14/2024   No results found for: KPAFRELGTCHN, LAMBDASER, KAPLAMBRATIO No results found for: IGGSERUM, IGA, IGMSERUM No results found for: STEPHANY CARLOTA BENSON MARKEL EARLA JOANNIE DOC VICK, SPEI   Chemistry      Component Value Date/Time   NA 137 08/24/2024 0857   K 4.2 08/24/2024 0857   CL 102 08/24/2024 0857   CO2 23 08/24/2024 0857   BUN 22 (H) 08/24/2024 0857   CREATININE 0.84 08/24/2024 0857      Component Value Date/Time   CALCIUM  9.6 08/24/2024  0857   ALKPHOS 75 08/24/2024 0857   AST 13 (L) 08/24/2024 0857   ALT 7 08/24/2024 0857   BILITOT 0.2 08/24/2024 0857       Impression and Plan: Ms. Purkey is a very pleasant 58 yo caucasian female with recurrent ductal carcinoma of the left breast.  Recurrence was in the neck. She was treated with incredibly aggressive chemo radiation therapy.  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  She is now on dose reduced Talzenna  on 3 weeks and off 1 week and is back on her Xarelto .  CA 27.29 is pending.  No transfusion needed today. Hgb is improved at 10.2.  She will continue her same regimen with Talzenna . We will proceed with CT of chest, abdomen and pelvis next week to follow-up.  Follow-up in 1 month.   Lauraine Pepper, NP 11/21/20251:58 PM

## 2024-09-15 LAB — CANCER ANTIGEN 27.29: CA 27.29: 57.6 U/mL — ABNORMAL HIGH (ref 0.0–38.6)

## 2024-09-17 ENCOUNTER — Ambulatory Visit: Payer: Self-pay

## 2024-09-17 DIAGNOSIS — R2681 Unsteadiness on feet: Secondary | ICD-10-CM

## 2024-09-17 DIAGNOSIS — M6281 Muscle weakness (generalized): Secondary | ICD-10-CM

## 2024-09-17 DIAGNOSIS — R262 Difficulty in walking, not elsewhere classified: Secondary | ICD-10-CM

## 2024-09-17 NOTE — Therapy (Signed)
 OUTPATIENT PHYSICAL THERAPY TREATMENT     Patient Name: ARYIANNA EARWOOD MRN: [id number] DOB: [birth year], 58 y.o., female Today's Date: 09/17/2024   END OF SESSION:  PT End of Session - 09/17/24 1025     Visit Number 11    Date for Recertification  10/22/24    Authorization Type [facility] Medicaid    Authorization Time Period 09/07/24-10/22/24   prior auth required after 08/25/24   Progress Note Due on Visit 20    PT Start Time 1017    PT Stop Time 1104    PT Time Calculation (min) 47 min    Activity Tolerance Patient tolerated treatment well;Patient limited by fatigue    Behavior During Therapy [facility] for tasks assessed/performed               Past Medical History:  Diagnosis Date   Anginal pain    Anxiety    Arthritis    Asthma    Bipolar 1 disorder (HCC)    Bipolar disease, chronic (HCC)    Breast cancer in female (HCC)    2019 and recurrent in 2023 to right breast, right axillary, and left neck   Coronary artery disease    Deep vein blood clot of right lower extremity (HCC)    Diabetes mellitus type 2 in obese    Goals of care, counseling/discussion 11/23/2021   History of external beam radiation therapy    Left neck-10/07/23-11/24/23-[facility]   Hyperlipidemia    Lymphedema    MI (myocardial infarction) (HCC)    was in [place]    Obesity    Pancreatitis    Personal history of chemotherapy    Personal history of radiation therapy    Sleep apnea    Past Surgical History:  Procedure Laterality Date   BREAST BIOPSY Left 10/03/2023   times 2   BREAST LUMPECTOMY Left 03/2019   CATARACT EXTRACTION Bilateral    CHOLECYSTECTOMY     IR IMAGING GUIDED PORT INSERTION  12/15/2021   IR PATIENT EVAL TECH 0-60 MINS  10/06/2023   IR REMOVAL TUN ACCESS W/ PORT W/O FL MOD SED  12/20/2019   IR US  GUIDE BX ASP/DRAIN  11/02/2021   LEFT HEART CATH AND CORONARY ANGIOGRAPHY N/A 08/22/2020   Procedure: LEFT HEART CATH AND CORONARY ANGIOGRAPHY;  Surgeon: Verlin Lonni BIRCH, MD;  Location: [facility];  Service: Cardiovascular;  Laterality: N/A;   TRIGGER FINGER RELEASE Bilateral    x 5   TUBAL LIGATION     UMBILICAL HERNIA REPAIR     x 2   uterine ablation     Patient Active Problem List   Diagnosis Date Noted   IDA (iron deficiency anemia) 08/27/2024   Coronary artery disease involving native coronary artery of native heart without angina pectoris 08/16/2024   Internal jugular (IJ) vein thromboembolism, acute (HCC) 10/06/2023   Facial swelling 10/05/2023   Tobacco use 07/14/2023   Type 2 diabetes mellitus with hyperglycemia, with long-term current use of insulin  (HCC) 06/23/2023   Stress incontinence of urine 06/23/2023   Moderate persistent asthma without complication 06/23/2023   Obesity (BMI 30-39.9) 06/23/2023   Encounter for orthopedic follow-up care 09/23/2021   Acquired trigger finger of left middle finger 08/27/2021   Chest pain of uncertain etiology    History of MI (myocardial infarction) 08/20/2020   History of pancreatitis 08/20/2020   Hyperlipidemia associated with type 2 diabetes mellitus (HCC) 08/20/2020   Bipolar disease, chronic (HCC)    Breast cancer (HCC) 11/26/2019  Chemotherapy-induced neuropathy 11/26/2019   History of DVT (deep vein thrombosis) 11/26/2019    PCP: Cyndi Shaver, PA-C (Inactive)   REFERRING PROVIDER: Timmy Maude SAUNDERS, MD   REFERRING DIAG: [id number] (ICD-10-CM) - Malignant neoplasm involving both nipple and areola of left breast in female, unspecified estrogen receptor status (HCC)   Per MD order: She is a bit deconditioned. She has lost weight. She does have breast cancer. I think she just needs some strengthening exercises for her legs.   THERAPY DIAG:  Muscle weakness (generalized)  Difficulty in walking, not elsewhere classified  Unsteadiness on feet  RATIONALE FOR EVALUATION AND TREATMENT: Rehabilitation  ONSET DATE: ~Dec 2024  NEXT MD VISIT: 09/14/24 with Lauraine Pepper,  NP   SUBJECTIVE:                                                                                                                                                                                                         SUBJECTIVE STATEMENT: Pt reports feeling wobbly today, not experiencing dizziness today.  EVAL: Pt reports her cancer returned in Dec 2024 and ever since she has been losing ~10# per month and she feels like her muscles have gone with the weight.  Now has difficulty with housework and working the yard or garden.  Unable to go shopping w/o using an electric cart. Also reports nerve pain at times.  She is in remission but does have terminal metastatic breast cancer, currently taking oral chemo - 3 weeks on/1 week off.  She states her blood counts are currently low.  PAIN: Are you having pain? No and Yes: NPRS scale: 0/10  Pain location: neck  PERTINENT HISTORY:  Angina; anxiety; arthritis - knee, hips, back and neck (4 bulging discs in neck); asthma; bipolar 1; breast cancer 2019 and recurrent 2023 to right breast, right axillary, and left neck; R LE DVT; DM-II; HLD; MI; pancreatitis; dizziness  PRECAUTIONS: None  RED FLAGS: None  WEIGHT BEARING RESTRICTIONS: No  FALLS:  Has patient fallen in last 6 months? No  LIVING ENVIRONMENT: Lives with: lives with their spouse and lives with their son Lives in: Mobile home - 5th wheel Stairs: Yes: External: 3 steps; on left going up Has following equipment at home: Single point cane, Environmental Consultant - 2 wheeled, Wheelchair (manual), and shower chair  OCCUPATION: On disability  PLOF: Independent with basic ADLs, Needs assistance with homemaking, and Leisure: gardening, travel - unable to participate in leisure activities right now  PATIENT GOALS: Be able to stand longer periods of time - get strength back in my  legs. Walk around easier.   OBJECTIVE: (objective measures completed at initial evaluation unless otherwise  dated)  DIAGNOSTIC FINDINGS:  06/11/24 - CT ANGIOGRAPHY CHEST WITH CONTRAST IMPRESSION: 1. Tiny subsegmental pulmonary embolism in a posterior left lower lobe pulmonary artery. No evidence of right heart strain. 2. Small pericardial effusion. 3. Postsurgical changes in the left breast with diffuse dermal and trabecular thickening similar to prior. Findings in the neck reported separately. 4. Unchanged vague nodularity in the left supraclavicular region. 5. Aortic Atherosclerosis (ICD10-I70.0).   06/03/24 - MR NECK WITH AND WITHOUT INTRAVENOUS CONTRAST IMPRESSION: 1. No acute findings within the soft tissues of the neck. 2. Mild to moderate degenerative changes in the cervical spine as described above. No abnormal enhancement of the spinal cord or nerve roots. 3. Interval resolution of left supraclavicular lymphadenopathy and soft tissue stranding.  03/02/24 - MRI HEAD WITHOUT AND WITH CONTRAST IMPRESSION: No acute intracranial abnormality. No findings to suggest intracranial metastatic disease.   Punctate susceptibility in the right parietal white matter slightly more conspicuous on current study likely due to differences in technique. No associated edema or enhancement. Findings likely reflect chronic microhemorrhages. Recommend attention on follow-up.   Similar 7 mm dural-based enhancing lesion over the left middle frontal gyrus suggestive of a small meningioma.   Trace mastoid effusions.  PATIENT SURVEYS:  LEFS  Extreme difficulty/unable (0), Quite a bit of difficulty (1), Moderate difficulty (2), Little difficulty (3), No difficulty (4) Survey date:  07/30/24 09/10/24 09/12/24  Any of your usual work, housework or school activities 1  2  2. Usual hobbies, recreational or sporting activities 0  1  3. Getting into/out of the bath 1  4  4. Walking between rooms 2  4  5. Putting on socks/shoes 4  4  6. Squatting  0  0  7. Lifting an object, like a bag of groceries from the  floor 2  2  8. Performing light activities around your home 1  4  9. Performing heavy activities around your home 0  2  10. Getting into/out of a car 4  4  11. Walking 2 blocks 0  0  12. Walking 1 mile 0  0  13. Going up/down 10 stairs (1 flight) 1  3  14. Standing for 1 hour 3  0  15.  sitting for 1 hour 0  4  16. Running on even ground 0  0  17. Running on uneven ground 0  0  18. Making sharp turns while running fast 0  0  19. Hopping  1  4  20. Rolling over in bed 2  4  Score total:  22 / 80 = 27.5 % 37 / 80 = 46.3 % 42 / 80 = 52.5 %  Functional limitation:   moderate       COGNITION: Overall cognitive status: Impaired (Chemo brain)  SENSATION: Chemo-based neuropathy in B hands and feet; nerve pain in legs  EDEMA:  Fluctuating edema in feet  POSTURE:  No Significant postural limitations  MUSCLE LENGTH: Hamstrings: mild tight B Piriformis: mod/severe tight R, mild tight L  LOWER EXTREMITY ROM: Grossly WFL other than limitation in hip rotation R>L  LOWER EXTREMITY MMT:  MMT Right eval Left eval R 08/22/24 L 08/22/24 R 09/12/24 L 09/12/24  Hip flexion 4- 4- 4+ 4+ 4+ 4+  Hip extension 3+ 4- 4 4 4+ 4+  Hip abduction 3+ 3+ 4+ 4 4 4   Hip adduction 3- 3- 4 4 4  4+  Hip internal rotation 4 4 4  4+ 4+ 4+  Hip external rotation 4- 4- 4+ 4 4 4   Knee flexion 4- 4- 4 4+ 4+ 4+  Knee extension 4 4 4+ 5 4+ 5  Ankle dorsiflexion 4 4- 4+ 4+ 4+ 4+  Ankle plantarflexion 3 (4 SLS HR) 3 (4 SLS HR) 9 HR 12 HR 15 HR 12 HR  Ankle inversion        Ankle eversion         (Blank rows = not tested)  FUNCTIONAL TESTS:  5 times sit to stand: 30.87 sec (several tries/attempts necessary on some reps) Timed up and go (TUG): 13.84 sec 10 meter walk test: 11.32 sec  Gait speed: 2.90 ft/sec Berg Balance Scale: 08/01/24 - 35/56; < 36 high risk for falls (close to 100%) Functional gait assessment: 08/01/24 - 7/30; < 19 = high risk fall  Interpretation of scores: Non-Specific Older Adults  Cutoff Score: <=22/30 = risk of falls Parkinson's Disease Cutoff score <15/30= fall risk (Hoehn & Yahr 1-4)  Minimally Clinically Important Difference (MCID)  Stroke (acute, subacute, and chronic) = MDC: 4.2 points Vestibular (acute) = MDC: 6 points Community Dwelling Older Adults =  MCID: 4 points Parkinson's Disease  =  MDC: 4.3 points  (Academy of Neurologic Physical Therapy (nd). Functional Gait Assessment. Retrieved from https://www.neuropt.org/docs/default-source/cpgs/core-outcome-measures/function-gait-assessment-pocket-guide-proof9-(2).pdf?sfvrsn=b29f35043_0.)  GAIT: Distance walked: clinic distances Assistive device utilized: None Level of assistance: Complete Independence and SBA Gait pattern: decreased gait velocity, decreased stride length, lateral hip instability, and wide BOS   TODAY'S TREATMENT:  09/17/24 THERAPEUTIC EXERCISE: To improve strength and endurance.  Demonstration, verbal and tactile cues throughout for technique.  NuStep - L3 x 2 min; L3x5min  NEUROMUSCULAR RE-EDUCATION: To improve coordination, kinesthesia, proprioception Standing heel raises 2 x 10 BLE Bridges GTB x 10 Hooklying single leg bent knee fallouts GTB 2x10 Tandem gait along mat table 6x Braiding along mat table 6x  THERAPEUTIC ACTIVITIES: To improve functional performance.  Demonstration, verbal and tactile cues throughout for technique. Step ups 6' BLE 2 x 10  Lateral step ups 6' BLE 2x10 Leg curls 10lb 2x10 BLE Leg extensions 5lb 2x10 BLE  09/12/2024 - Progress Note THERAPEUTIC EXERCISE: To improve strength and endurance.  Demonstration, verbal and tactile cues throughout for technique.  NuStep - L4 x 6' - UE/LE   258' walking in hallway w/o AD before need for seated rest break, slight tendency to veer to R  THERAPEUTIC ACTIVITIES: To improve functional performance.  Demonstration, verbal and tactile cues throughout for technique. LEFS: 42 / 80 = 52.5 % LE MMT Goal  assessment  NEUROMUSCULAR RE-EDUCATION: To improve balance, coordination, kinesthesia, posture, proprioception, and reduce fall risk. SLS + 4-way RTB SLR x 10 each direction for B LE, chair available for UE support PRN but minimal use (1 self-corrected LOB with R hip ABD)   09/10/2024  THERAPEUTIC EXERCISE: To improve strength and endurance.  Demonstration, verbal and tactile cues throughout for technique.  NuStep - L3 x 6 min  Gait in hallway- 42ft veers off to the sides at times, noted feeling swimmy x 2 Pulse checked manually after gait- 100 BPM NEUROMUSCULAR RE-EDUCATION: To improve coordination, kinesthesia, proprioception All exercises w/ TrA contraction: Supine bent knee fallouts GTB 2x10 Supine march GTB 2x10 Bridges GTB x 10 B shoulder ER YTB x 10 B shoulder horizontal ABD YTB x 10 Chin tucks x 10 B   09/06/2024  THERAPEUTIC EXERCISE: To improve strength and endurance.  Demonstration, verbal and  tactile cues throughout for technique.  NuStep - L3 x 6 min   NEUROMUSCULAR RE-EDUCATION: To improve balance, coordination, kinesthesia, proprioception, and reduce fall risk.  Berg Balance Test   Sit to Stand Able to stand without using hands and stabilize independently    Standing Unsupported Able to stand safely 2 minutes    Sitting with Back Unsupported but Feet Supported on Floor or Stool Able to sit safely and securely 2 minutes    Stand to Sit Sits safely with minimal use of hands    Transfers Able to transfer safely, minor use of hands    Standing Unsupported with Eyes Closed Able to stand 10 seconds safely    Standing Unsupported with Feet Together Able to place feet together independently and stand 1 minute safely    From Standing, Reach Forward with Outstretched Arm Can reach confidently >25 cm (10)    From Standing Position, Pick up Object from Floor Able to pick up shoe safely and easily    From Standing Position, Turn to Look Behind Over each Shoulder Looks behind  from both sides and weight shifts well    Turn 360 Degrees Able to turn 360 degrees safely in 4 seconds or less    Standing Unsupported, Alternately Place Feet on Step/Stool Able to stand independently and safely and complete 8 steps in 20 seconds    Standing Unsupported, One Foot in Front Needs help to step but can hold 15 seconds    Standing on One Leg Tries to lift leg/unable to hold 3 seconds but remains standing independently    Total Score 50      Functional Gait  Assessment   Gait Level Surface Walks 20 ft, slow speed, abnormal gait pattern, evidence for imbalance or deviates 10-15 in outside of the 12 in walkway width. Requires more than 7 sec to ambulate 20 ft.    Change in Gait Speed Able to smoothly change walking speed without loss of balance or gait deviation. Deviate no more than 6 in outside of the 12 in walkway width.    Gait with Horizontal Head Turns Performs head turns with moderate changes in gait velocity, slows down, deviates 10-15 in outside 12 in walkway width but recovers, can continue to walk.    Gait with Vertical Head Turns Performs task with slight change in gait velocity (eg, minor disruption to smooth gait path), deviates 6 - 10 in outside 12 in walkway width or uses assistive device    Gait and Pivot Turn Pivot turns safely within 3 sec and stops quickly with no loss of balance.    Step Over Obstacle Is able to step over 2 stacked shoe boxes taped together (9 in total height) without changing gait speed. No evidence of imbalance.    Gait with Narrow Base of Support Ambulates less than 4 steps heel to toe or cannot perform without assistance.    Gait with Eyes Closed Walks 20 ft, slow speed, abnormal gait pattern, evidence for imbalance, deviates 10-15 in outside 12 in walkway width. Requires more than 9 sec to ambulate 20 ft.    Ambulating Backwards Walks 20 ft, slow speed, abnormal gait pattern, evidence for imbalance, deviates 10-15 in outside 12 in walkway width.     Steps Two feet to a stair, must use rail.    Total Score 16     *All standing exercises with UE support at counter for balance. Tandem gait to end; retro gait to other end 3x each Gait  with head turns horiz 2x, vertical 2x   08/22/2024  THERAPEUTIC EXERCISE: To improve strength and endurance.  Demonstration, verbal and tactile cues throughout for technique.  NuStep - L3 x 6 min  Tested LE strength  THERAPEUTIC ACTIVITIES: To improve strength and functional performance.  Demonstration, verbal and tactile cues throughout for technique. Standing hip ABD with looped YTB at ankles x 10 bil Standing hip extension with looped YTB at ankles x 10 bil Standing hip flexion march with looped YTB at midfeet x 10 bil Standing heel toe raises 2 x 10 Sidesteps YTB at ankles 4x10 along counter   *All standing exercises with UE support at counter for balance.  NEUROMUSCULAR RE-EDUCATION: To improve balance, coordination, kinesthesia, proprioception, and reduce fall risk. Standing on Airex pad: Weight shifting x 10 fwd/back Step to floor back to airex 2x5 BLE Toe clears to 9 stool 2x5 BLE; x 10 additional reps BLE  08/17/2024  THERAPEUTIC EXERCISE: To improve strength and endurance.  Demonstration, verbal and tactile cues throughout for technique.  NuStep - L3 x 6 min  Seated LAQ with looped YTB at ankles 2 x 10  THERAPEUTIC ACTIVITIES: To improve strength and functional performance.  Demonstration, verbal and tactile cues throughout for technique. Standing hip ABD with looped YTB at ankles x 10 bil Standing hip extension with looped YTB at ankles x 10 bil Standing hip flexion march with looped YTB at midfeet x 10 bil Standing heel toe raises x 10 *All standing exercises with UE support on wall ladder for balance.  NEUROMUSCULAR RE-EDUCATION: To improve balance, coordination, kinesthesia, proprioception, and reduce fall risk. Standing on Airex pad: Weight shifting Marching in place Toe  clears to 9 stool   08/15/24 Nustep L3x95min UE/LE Supine DKTC orange ball x 20 Leg curls 10lb x 10 BLE- lightning bolt feeling in L calf Leg ext 5lb 2x10 BLE Seated hip ABD RTB x 10 Seated marching RTB x 10  NEUROMUSCULAR RE-EDUCATION: 5xSTS- 17.37 seconds Standing on airex:  Narrow BOS balance x 1 min  EC x 1 min  Normal BOS w/ head turns x5; trunk rotations x 5  Heel/toe raises x 10 B Bridges 2x10 orange pball   08/10/24 NEUROMUSCULAR RE-EDUCATION: Tandem gait along counter 2x down/back intermittent support Standing on airex:  Normal BOS balance x 1 min- wobbly  Narrow BOS balance x 1 min- wobbly  EC x 1 min  Normal BOS w/ head turns x5; trunk rotations x 5  THERAPEUTIC EXERCISE:  UBE L1.0 3 min fwd/back Bridges with PPT 2x10 Supine feet on orange pball:  DKTC x 10  Trunk rotation x 10 B  Iso HS curl + PPT x 10 B  THERAPEUTIC ACTIVITIES:  Standing hip abduction x 10 BLE Standing hip extension x 10 BLE Standing marching x 10 BLE Standing heel/toe raises x 10 BLE Sit to stands 2x5   08/08/24 NEUROMUSCULAR RE-EDUCATION: To improve coordination, kinesthesia, posture, and proprioception.  Standing heel/toe raises x 10 B- needed to sit after each exercise reporting dizziness for ~2-3 seconds Seated ab sets with beach ball 2x10 Seated B Shoulder ER and hip ER 2x10 Tandem stance x 30 BLE Clock balance R/L 12 to 6 o'clock 1x around Supine PPT 2x10 Supine marches with PPT x 10 B Bridges x 10  THERAPEUTIC EXERCISE: To improve strength, endurance, ROM, and flexibility Nustep L2x41min Brief HEP review LTR both ways x 10 Supine SLR x 10 BLE Supine SKTC stretch x 30 R/L Supine figure 4 stretch x 30 R/L  08/01/2024  PHYSICAL PERFORMANCE TEST or MEASUREMENT:  Berg Balance Test   Sit to Stand Able to stand  independently using hands    Standing Unsupported Able to stand 30 seconds unsupported   had to stop due to feeling dizzy   Sitting with Back Unsupported  but Feet Supported on Floor or Stool Able to sit safely and securely 2 minutes    Stand to Sit Uses backs of legs against chair to control descent    Transfers Able to transfer safely, definite need of hands    Standing Unsupported with Eyes Closed Able to stand 10 seconds with supervision    Standing Unsupported with Feet Together Able to place feet together independently but unable to hold for 30 seconds    From Standing, Reach Forward with Outstretched Arm Can reach confidently >25 cm (10)    From Standing Position, Pick up Object from Floor Able to pick up shoe, needs supervision    From Standing Position, Turn to Look Behind Over each Shoulder Turn sideways only but maintains balance    Turn 360 Degrees Needs close supervision or verbal cueing    Standing Unsupported, Alternately Place Feet on Step/Stool Able to stand independently and complete 8 steps >20 seconds    Standing Unsupported, One Foot in Front Needs help to step but can hold 15 seconds    Standing on One Leg Able to lift leg independently and hold equal to or more than 3 seconds    Total Score 35    Berg comment: < 36 high risk for falls (close to 100%)      Functional Gait  Assessment   Gait Level Surface Walks 20 ft, slow speed, abnormal gait pattern, evidence for imbalance or deviates 10-15 in outside of the 12 in walkway width. Requires more than 7 sec to ambulate 20 ft.    Change in Gait Speed Makes only minor adjustments to walking speed, or accomplishes a change in speed with significant gait deviations, deviates 10-15 in outside the 12 in walkway width, or changes speed but loses balance but is able to recover and continue walking.    Gait with Horizontal Head Turns Performs head turns with moderate changes in gait velocity, slows down, deviates 10-15 in outside 12 in walkway width but recovers, can continue to walk.    Gait with Vertical Head Turns Performs task with moderate change in gait velocity, slows down,  deviates 10-15 in outside 12 in walkway width but recovers, can continue to walk.    Gait and Pivot Turn Turns slowly, requires verbal cueing, or requires several small steps to catch balance following turn and stop    Step Over Obstacle Is able to step over one shoe box (4.5 in total height) but must slow down and adjust steps to clear box safely. May require verbal cueing.    Gait with Narrow Base of Support Ambulates less than 4 steps heel to toe or cannot perform without assistance.    Gait with Eyes Closed Cannot walk 20 ft without assistance, severe gait deviations or imbalance, deviates greater than 15 in outside 12 in walkway width or will not attempt task.    Ambulating Backwards Walks 20 ft, slow speed, abnormal gait pattern, evidence for imbalance, deviates 10-15 in outside 12 in walkway width.    Steps Cannot do safely.    Total Score 7    FGA comment: < 19 = high risk fall       THERAPEUTIC EXERCISE: To improve  strength and endurance.  Demonstration, verbal and tactile cues throughout for technique.  Seated heel toe raises x 10 Seated hip ADD isometrics with ball 10 x 5 Seated LAQ x 10 Seated YTB hip flexion march x 10 Seated B hip ABD with looped YTB at distal thighs x 10 - better tolerated isolating 1 leg at a time   07/30/2024  SELF CARE:  Reviewed eval findings and role of PT in addressing identified deficits as well as instruction in initial HEP (see below).    PATIENT EDUCATION:  Education details: HEP progression - YTB resistance added to standing exercises and seated LAQ  Person educated: Patient Education method: Explanation, Demonstration, Verbal cues, and Handouts Education comprehension: verbalized understanding, returned demonstration, verbal cues required, and needs further education  HOME EXERCISE PROGRAM: Access Code: [email or web address] URL: [email or web address] Date: 09/06/2024 Prepared by: Mario Coronado  Exercises - Seated March with Resistance   - 1 x daily - 5-7 x weekly - 1-2 sets - 8-10 reps - 3 sec hold - Seated Hip Abduction with Resistance  - 1 x daily - 5-7 x weekly - 1-2 sets - 8-10 reps - 3-5 sec hold - Seated Hip Adduction Isometrics with Ball  - 1 x daily - 5-7 x weekly - 1-2 sets - 8-10 reps - 3-5 sec hold - Standing Hip Abduction with Resistance at Ankles and Counter Support  - 1 x daily - 3-4 x weekly - 2 sets - 10 reps - 3 sec hold - Standing Hip Extension with Resistance at Ankles and Counter Support  - 1 x daily - 3-4 x weekly - 2 sets - 10 reps - 3 sec hold - Marching with Resistance  - 1 x daily - 3-4 x weekly - 2 sets - 10 reps - 3 sec hold - Seated Knee Extension with Resistance  - 1 x daily - 3-4 x weekly - 2 sets - 10 reps - 3 sec hold - Tandem Walking with Counter Support  - 1 x daily - 7 x weekly - 3 sets - 10 reps - Backward Walking with Counter Support  - 1 x daily - 7 x weekly - 3 sets - 10 reps   ASSESSMENT:  CLINICAL IMPRESSION: Advanced functional stair climbing activities with strengthening interventions as tolerated.  Focused entirely on strength this session. Worked on dynamic balance as well, which showed some deficits.  EVAL: AUNISTY REALI is a 58 y.o. female who was referred to physical therapy for evaluation and treatment for LE weakness and deconditioning 2 metastatic breast cancer.  She reports she experienced a recurrence of her cancer in December 2024 and ever since she has been losing ~10# per month and she feels like some of the weight loss has been related to muscle mass loss leading to generalized weakness and poor endurance with ADLs, daily mobility and household tasks.  Her energy levels are also impacted by low blood counts and she also experiences chemo-induced peripheral neuropathy as well as intermittent nerve pain.  Patient has deficits in hip ROM, B LE flexibility, B LE strength, and impaired balance and activity tolerance which are interfering with ADLs and are impacting quality of  life.  On LEFS patient scored 22/80 demonstrating moderate functional limitation.  Aliese Brannum will benefit from skilled PT to address above deficits to improve mobility and activity tolerance with decreased pain interference.  OBJECTIVE IMPAIRMENTS: Abnormal gait, cardiopulmonary status limiting activity, decreased activity tolerance, decreased endurance, decreased knowledge of use of DME, decreased mobility, difficulty  walking, decreased strength, decreased safety awareness, dizziness, impaired flexibility, impaired sensation, postural dysfunction, and pain.   ACTIVITY LIMITATIONS: carrying, lifting, bending, sitting, standing, squatting, stairs, transfers, bed mobility, bathing, dressing, hygiene/grooming, and locomotion level  PARTICIPATION LIMITATIONS: meal prep, cleaning, laundry, driving, shopping, community activity, and yard work  PERSONAL FACTORS: Fitness, Past/current experiences, Time since onset of injury/illness/exacerbation, and 3+ comorbidities: Angina; anxiety; arthritis - knee, hips, back and neck (4 bulging discs in neck); asthma; bipolar 1; breast cancer 2019 and recurrent 2023 to right breast, right axillary, and left neck; R LE DVT; DM-II; HLD; MI; pancreatitis; dizziness are also affecting patient's functional outcome.   REHAB POTENTIAL: Good  CLINICAL DECISION MAKING: Unstable/unpredictable  EVALUATION COMPLEXITY: High   GOALS: Goals reviewed with patient? Yes  SHORT TERM GOALS: Target date: 09/10/2024  Patient will be independent with initial HEP. Baseline: Initial HEP provided on eval 08/01/24 - reviewed initial HEP Goal status: MET - 08/17/24  2.  Complete standardized balance testing and update LTG's accordingly. Baseline: Berg & FGA pending Goal status: MET - 08/01/24  3.  Patient will improve 5x STS time to </= 24 seconds for improved efficiency and safety with transfers. Baseline: 30.87 sec Goal status: MET - 08/15/24 - 17.37 sec  LONG TERM GOALS:  Target date: 10/22/2024  Patient will be independent with advanced/ongoing HEP to improve outcomes and carryover.  Baseline:  Goal status: IN PROGRESS - 09/12/24 - Met for current HEP  2.  Patient will demonstrate improved B LE strength to >/= 4 to 4+/5 for improved stability and ease of mobility. Baseline: Refer to above LE MMT table 08/22/24 - goal met Goal status: MET - 09/12/24 - strength continues to improve  3.  Patient will be able to ambulate 600' w/o AD and normal gait pattern without increased pain to access community.  Baseline: very limited ambulation tolerance  Goal status: IN PROGRESS - 09/12/24 - 258' before seated rest break needed due to LE weakness, slight tendency to veer to R  4. Patient will report >/= 35/80 on LEFS (MCID = 9 pts) to demonstrate improved functional ability. Baseline: 22 / 80 = 27.5 % 09/10/24 - 37 / 80 = 46.3 % Goal status: MET - 09/12/24 - 42 / 80 = 52.5 %  5.  Patient will demonstrate at least 19/30 on FGA to decrease risk of falls. Baseline: 08/01/24 - 7/30; < 19 = high risk fall  Goal status: IN PROGRESS - 09/06/24 - 16/30  6.  Patient will improve Berg score by at least 8 points to improve safety and stability with ADLs in standing and reduce risk for falls Baseline: 08/01/24 - 35/56; < 36 high risk for falls (close to 100%) Goal status: MET - 09/06/24 - 50/56   PLAN:  PT FREQUENCY: 2x/week  PT DURATION: 12 weeks  PLANNED INTERVENTIONS: 97164- PT Re-evaluation, 97750- Physical Performance Testing, 97110-Therapeutic exercises, 97530- Therapeutic activity, 97112- Neuromuscular re-education, 97535- Self Care, 02859- Manual therapy, [id number]- Gait training, [phone]- Aquatic Therapy, [phone]- Electrical stimulation (unattended), [id number]- Ultrasound, 02966- Ionotophoresis 4mg /ml Dexamethasone , 79439 (1-2 muscles), 20561 (3+ muscles)- Dry Needling, Patient/Family education, Balance training, Stair training, Taping, Vestibular training, DME instructions,  Cryotherapy, and Moist heat  PLAN FOR NEXT SESSION: Proximal LE strengthening; progression of dynamic balance and endurance activities as tolerated   Jakyria Bleau L Algenis Ballin, PTA 09/17/2024, 11:16 AM

## 2024-09-19 ENCOUNTER — Ambulatory Visit

## 2024-09-25 ENCOUNTER — Ambulatory Visit (HOSPITAL_BASED_OUTPATIENT_CLINIC_OR_DEPARTMENT_OTHER)

## 2024-09-26 ENCOUNTER — Inpatient Hospital Stay: Admitting: Family

## 2024-09-26 ENCOUNTER — Inpatient Hospital Stay

## 2024-10-01 ENCOUNTER — Other Ambulatory Visit: Payer: Self-pay

## 2024-10-01 ENCOUNTER — Other Ambulatory Visit (HOSPITAL_BASED_OUTPATIENT_CLINIC_OR_DEPARTMENT_OTHER): Payer: Self-pay

## 2024-10-03 ENCOUNTER — Other Ambulatory Visit: Payer: Self-pay

## 2024-10-03 ENCOUNTER — Other Ambulatory Visit: Payer: Self-pay | Admitting: Hematology & Oncology

## 2024-10-03 ENCOUNTER — Ambulatory Visit (HOSPITAL_BASED_OUTPATIENT_CLINIC_OR_DEPARTMENT_OTHER): Admission: RE | Admit: 2024-10-03 | Discharge: 2024-10-03 | Attending: Family

## 2024-10-03 ENCOUNTER — Other Ambulatory Visit (HOSPITAL_BASED_OUTPATIENT_CLINIC_OR_DEPARTMENT_OTHER): Payer: Self-pay

## 2024-10-03 DIAGNOSIS — C50012 Malignant neoplasm of nipple and areola, left female breast: Secondary | ICD-10-CM | POA: Insufficient documentation

## 2024-10-03 MED ORDER — HYDROCODONE-ACETAMINOPHEN 7.5-325 MG PO TABS
1.0000 | ORAL_TABLET | Freq: Four times a day (QID) | ORAL | 0 refills | Status: DC | PRN
  Filled 2024-10-03: qty 120, 30d supply, fill #0

## 2024-10-03 MED ORDER — IOHEXOL 300 MG/ML  SOLN
100.0000 mL | Freq: Once | INTRAMUSCULAR | Status: AC | PRN
  Administered 2024-10-03: 100 mL via INTRAVENOUS

## 2024-10-04 ENCOUNTER — Other Ambulatory Visit (HOSPITAL_BASED_OUTPATIENT_CLINIC_OR_DEPARTMENT_OTHER): Payer: Self-pay

## 2024-10-08 ENCOUNTER — Ambulatory Visit: Admitting: Physical Therapy

## 2024-10-09 ENCOUNTER — Ambulatory Visit: Admitting: Family Medicine

## 2024-10-09 DIAGNOSIS — E1165 Type 2 diabetes mellitus with hyperglycemia: Secondary | ICD-10-CM

## 2024-10-09 NOTE — Progress Notes (Incomplete)
 Acute Office Visit  Subjective:  Patient ID: Ariana Herrera, female    DOB: 1965/11/25  Age: 58 y.o. MRN: [id number]  CC: No chief complaint on file.     HPI Ariana Herrera is here for stomach upset.       Past Medical History:  Diagnosis Date   Anginal pain    Anxiety    Arthritis    Asthma    Bipolar 1 disorder (HCC)    Bipolar disease, chronic (HCC)    Breast cancer in female (HCC)    2019 and recurrent in 2023 to right breast, right axillary, and left neck   Coronary artery disease    Deep vein blood clot of right lower extremity (HCC)    Diabetes mellitus type 2 in obese    Goals of care, counseling/discussion 11/23/2021   History of external beam radiation therapy    Left neck-10/07/23-11/24/23-[facility]   Hyperlipidemia    Lymphedema    MI (myocardial infarction) (HCC)    was in [place]    Obesity    Pancreatitis    Personal history of chemotherapy    Personal history of radiation therapy    Sleep apnea     Past Surgical History:  Procedure Laterality Date   BREAST BIOPSY Left 10/03/2023   times 2   BREAST LUMPECTOMY Left 03/2019   CATARACT EXTRACTION Bilateral    CHOLECYSTECTOMY     IR IMAGING GUIDED PORT INSERTION  12/15/2021   IR PATIENT EVAL TECH 0-60 MINS  10/06/2023   IR REMOVAL TUN ACCESS W/ PORT W/O FL MOD SED  12/20/2019   IR US  GUIDE BX ASP/DRAIN  11/02/2021   LEFT HEART CATH AND CORONARY ANGIOGRAPHY N/A 08/22/2020   Procedure: LEFT HEART CATH AND CORONARY ANGIOGRAPHY;  Surgeon: Verlin Lonni BIRCH, MD;  Location: [facility];  Service: Cardiovascular;  Laterality: N/A;   TRIGGER FINGER RELEASE Bilateral    x 5   TUBAL LIGATION     UMBILICAL HERNIA REPAIR     x 2   uterine ablation      Family History  Problem Relation Age of Onset   Heart attack Mother 66   Diabetes Mother    Breast cancer Mother    Breast cancer Cousin    Colon cancer Neg Hx    Esophageal cancer Neg Hx    Pancreatic cancer Neg Hx     Stomach cancer Neg Hx     Social History   Socioeconomic History   Marital status: Married    Spouse name: Not on file   Number of children: Not on file   Years of education: Not on file   Highest education level: Some college, no degree  Occupational History   Occupation: disabled due to neuropathy and cancer treatments  Tobacco Use   Smoking status: Every Day    Current packs/day: 1.00    Average packs/day: 1 pack/day for 40.0 years (40.0 ttl pk-yrs)    Types: Cigarettes    Start date: 58   Smokeless tobacco: Never  Vaping Use   Vaping status: Never Used  Substance and Sexual Activity   Alcohol use: Never   Drug use: Yes    Types: Marijuana    Comment: occasionally   Sexual activity: Not Currently  Other Topics Concern   Not on file  Social History Narrative   Not on file   Social Drivers of Health   Tobacco Use: High Risk (09/17/2024)   Patient History  Smoking Tobacco Use: Every Day    Smokeless Tobacco Use: Never    Passive Exposure: Not on file  Financial Resource Strain: High Risk (08/15/2024)   Overall Financial Resource Strain (CARDIA)    Difficulty of Paying Living Expenses: Hard  Food Insecurity: Food Insecurity Present (08/15/2024)   Epic    Worried About Programme Researcher, Broadcasting/film/video in the Last Year: Often true    Ran Out of Food in the Last Year: Often true  Transportation Needs: Unknown (08/15/2024)   Epic    Lack of Transportation (Medical): Not on file    Lack of Transportation (Non-Medical): No  Physical Activity: Insufficiently Active (08/15/2024)   Exercise Vital Sign    Days of Exercise per Week: 3 days    Minutes of Exercise per Session: 10 min  Stress: Stress Concern Present (08/15/2024)   [employer] of Occupational Health - Occupational Stress Questionnaire    Feeling of Stress: To some extent  Social Connections: Moderately Isolated (08/15/2024)   Social Connection and Isolation Panel    Frequency of Communication with Friends and  Family: Twice a week    Frequency of Social Gatherings with Friends and Family: More than three times a week    Attends Religious Services: Never    Database Administrator or Organizations: No    Attends Engineer, Structural: Not on file    Marital Status: Married  Catering Manager Violence: Not At Risk (10/05/2023)   Humiliation, Afraid, Rape, and Kick questionnaire    Fear of Current or Ex-Partner: No    Emotionally Abused: No    Physically Abused: No    Sexually Abused: No  Depression (PHQ2-9): Low Risk (09/14/2024)   Depression (PHQ2-9)    PHQ-2 Score: 0  Alcohol Screen: Low Risk (12/27/2023)   Alcohol Screen    Last Alcohol Screening Score (AUDIT): 1  Housing: Low Risk (08/15/2024)   Epic    Unable to Pay for Housing in the Last Year: No    Number of Times Moved in the Last Year: 1    Homeless in the Last Year: No  Utilities: Low Risk (02/20/2024)   Received from [facility]   Utilities    In the past 12 months has the electric, gas, oil, or water company threatened to shut off services in your home? : No  Health Literacy: Not on file    ROS All ROS negative except what is listed in the HPI.   Objective:   Today's Vitals: LMP 10/04/2016 Comment: after procedure to stop bleeding  Physical Exam  Assessment & Plan:   Problem List Items Addressed This Visit       Endocrine   Type 2 diabetes mellitus with hyperglycemia, with long-term current use of insulin  (HCC) - Primary      Follow-up: No follow-ups on file.   Ariana FURY Almarie, DNP, FNP-C  I,Emily Lagle,acting as a neurosurgeon for Ariana KATHEE Almarie, NP.,have documented all relevant documentation on the behalf of Ariana KATHEE Almarie, NP.   I, Ariana KATHEE Almarie, NP, have reviewed all documentation for this visit. The documentation on 10/09/2024 for the exam, diagnosis, procedures, and orders are all accurate and complete.

## 2024-10-10 ENCOUNTER — Ambulatory Visit: Admitting: Physical Therapy

## 2024-10-12 ENCOUNTER — Inpatient Hospital Stay: Admitting: Hematology & Oncology

## 2024-10-12 ENCOUNTER — Inpatient Hospital Stay: Attending: Hematology & Oncology

## 2024-10-12 ENCOUNTER — Inpatient Hospital Stay (HOSPITAL_BASED_OUTPATIENT_CLINIC_OR_DEPARTMENT_OTHER)
Admission: EM | Admit: 2024-10-12 | Discharge: 2024-10-15 | DRG: 193 | Disposition: A | Discharge status: Home / Self Care | Attending: Internal Medicine | Admitting: Internal Medicine

## 2024-10-12 ENCOUNTER — Inpatient Hospital Stay

## 2024-10-12 ENCOUNTER — Emergency Department (HOSPITAL_BASED_OUTPATIENT_CLINIC_OR_DEPARTMENT_OTHER)

## 2024-10-12 ENCOUNTER — Encounter (HOSPITAL_BASED_OUTPATIENT_CLINIC_OR_DEPARTMENT_OTHER): Payer: Self-pay | Admitting: Emergency Medicine

## 2024-10-12 ENCOUNTER — Encounter: Payer: Self-pay | Admitting: Hematology & Oncology

## 2024-10-12 ENCOUNTER — Other Ambulatory Visit: Payer: Self-pay

## 2024-10-12 VITALS — BP 121/59 | HR 93

## 2024-10-12 VITALS — BP 76/44 | HR 56 | Temp 97.6°F | Resp 20 | Ht 62.0 in | Wt 133.0 lb

## 2024-10-12 DIAGNOSIS — D509 Iron deficiency anemia, unspecified: Secondary | ICD-10-CM

## 2024-10-12 DIAGNOSIS — Z86718 Personal history of other venous thrombosis and embolism: Secondary | ICD-10-CM | POA: Diagnosis not present

## 2024-10-12 DIAGNOSIS — C50012 Malignant neoplasm of nipple and areola, left female breast: Secondary | ICD-10-CM

## 2024-10-12 DIAGNOSIS — R296 Repeated falls: Secondary | ICD-10-CM | POA: Diagnosis present

## 2024-10-12 DIAGNOSIS — Z716 Tobacco abuse counseling: Secondary | ICD-10-CM

## 2024-10-12 DIAGNOSIS — G4730 Sleep apnea, unspecified: Secondary | ICD-10-CM | POA: Diagnosis not present

## 2024-10-12 DIAGNOSIS — D61810 Antineoplastic chemotherapy induced pancytopenia: Secondary | ICD-10-CM | POA: Diagnosis present

## 2024-10-12 DIAGNOSIS — G620 Drug-induced polyneuropathy: Secondary | ICD-10-CM | POA: Diagnosis not present

## 2024-10-12 DIAGNOSIS — Z171 Estrogen receptor negative status [ER-]: Secondary | ICD-10-CM

## 2024-10-12 DIAGNOSIS — I252 Old myocardial infarction: Secondary | ICD-10-CM

## 2024-10-12 DIAGNOSIS — F319 Bipolar disorder, unspecified: Secondary | ICD-10-CM | POA: Diagnosis present

## 2024-10-12 DIAGNOSIS — C50912 Malignant neoplasm of unspecified site of left female breast: Secondary | ICD-10-CM | POA: Diagnosis present

## 2024-10-12 DIAGNOSIS — Z170 Estrogen receptor positive status [ER+]: Secondary | ICD-10-CM

## 2024-10-12 DIAGNOSIS — L03114 Cellulitis of left upper limb: Secondary | ICD-10-CM

## 2024-10-12 DIAGNOSIS — J159 Unspecified bacterial pneumonia: Principal | ICD-10-CM | POA: Diagnosis present

## 2024-10-12 DIAGNOSIS — T451X5A Adverse effect of antineoplastic and immunosuppressive drugs, initial encounter: Secondary | ICD-10-CM

## 2024-10-12 DIAGNOSIS — E1165 Type 2 diabetes mellitus with hyperglycemia: Secondary | ICD-10-CM | POA: Diagnosis present

## 2024-10-12 DIAGNOSIS — N61 Mastitis without abscess: Secondary | ICD-10-CM

## 2024-10-12 DIAGNOSIS — Z9049 Acquired absence of other specified parts of digestive tract: Secondary | ICD-10-CM

## 2024-10-12 DIAGNOSIS — D649 Anemia, unspecified: Secondary | ICD-10-CM

## 2024-10-12 DIAGNOSIS — F17210 Nicotine dependence, cigarettes, uncomplicated: Secondary | ICD-10-CM | POA: Diagnosis present

## 2024-10-12 DIAGNOSIS — Z9641 Presence of insulin pump (external) (internal): Secondary | ICD-10-CM | POA: Diagnosis present

## 2024-10-12 DIAGNOSIS — N3000 Acute cystitis without hematuria: Secondary | ICD-10-CM | POA: Diagnosis present

## 2024-10-12 DIAGNOSIS — D61818 Other pancytopenia: Secondary | ICD-10-CM | POA: Diagnosis not present

## 2024-10-12 DIAGNOSIS — K5900 Constipation, unspecified: Secondary | ICD-10-CM | POA: Diagnosis present

## 2024-10-12 DIAGNOSIS — I2510 Atherosclerotic heart disease of native coronary artery without angina pectoris: Secondary | ICD-10-CM | POA: Diagnosis present

## 2024-10-12 DIAGNOSIS — E538 Deficiency of other specified B group vitamins: Secondary | ICD-10-CM

## 2024-10-12 DIAGNOSIS — E860 Dehydration: Secondary | ICD-10-CM | POA: Diagnosis present

## 2024-10-12 DIAGNOSIS — Z7901 Long term (current) use of anticoagulants: Secondary | ICD-10-CM

## 2024-10-12 DIAGNOSIS — R600 Localized edema: Secondary | ICD-10-CM

## 2024-10-12 DIAGNOSIS — R0789 Other chest pain: Secondary | ICD-10-CM

## 2024-10-12 DIAGNOSIS — Z833 Family history of diabetes mellitus: Secondary | ICD-10-CM

## 2024-10-12 DIAGNOSIS — K5904 Chronic idiopathic constipation: Secondary | ICD-10-CM

## 2024-10-12 DIAGNOSIS — C770 Secondary and unspecified malignant neoplasm of lymph nodes of head, face and neck: Secondary | ICD-10-CM

## 2024-10-12 DIAGNOSIS — G9341 Metabolic encephalopathy: Secondary | ICD-10-CM | POA: Diagnosis not present

## 2024-10-12 DIAGNOSIS — R339 Retention of urine, unspecified: Secondary | ICD-10-CM

## 2024-10-12 DIAGNOSIS — Z803 Family history of malignant neoplasm of breast: Secondary | ICD-10-CM

## 2024-10-12 DIAGNOSIS — F419 Anxiety disorder, unspecified: Secondary | ICD-10-CM | POA: Diagnosis present

## 2024-10-12 DIAGNOSIS — J189 Pneumonia, unspecified organism: Secondary | ICD-10-CM | POA: Diagnosis not present

## 2024-10-12 DIAGNOSIS — Z8249 Family history of ischemic heart disease and other diseases of the circulatory system: Secondary | ICD-10-CM

## 2024-10-12 DIAGNOSIS — Z880 Allergy status to penicillin: Secondary | ICD-10-CM

## 2024-10-12 DIAGNOSIS — Z794 Long term (current) use of insulin: Secondary | ICD-10-CM

## 2024-10-12 DIAGNOSIS — Z95828 Presence of other vascular implants and grafts: Secondary | ICD-10-CM

## 2024-10-12 DIAGNOSIS — Z888 Allergy status to other drugs, medicaments and biological substances status: Secondary | ICD-10-CM

## 2024-10-12 DIAGNOSIS — G939 Disorder of brain, unspecified: Secondary | ICD-10-CM

## 2024-10-12 DIAGNOSIS — R220 Localized swelling, mass and lump, head: Secondary | ICD-10-CM

## 2024-10-12 DIAGNOSIS — W57XXXA Bitten or stung by nonvenomous insect and other nonvenomous arthropods, initial encounter: Secondary | ICD-10-CM

## 2024-10-12 DIAGNOSIS — C50011 Malignant neoplasm of nipple and areola, right female breast: Secondary | ICD-10-CM

## 2024-10-12 DIAGNOSIS — Z79899 Other long term (current) drug therapy: Secondary | ICD-10-CM

## 2024-10-12 DIAGNOSIS — Z7984 Long term (current) use of oral hypoglycemic drugs: Secondary | ICD-10-CM

## 2024-10-12 DIAGNOSIS — R911 Solitary pulmonary nodule: Secondary | ICD-10-CM | POA: Diagnosis present

## 2024-10-12 DIAGNOSIS — Z1152 Encounter for screening for COVID-19: Secondary | ICD-10-CM

## 2024-10-12 DIAGNOSIS — N390 Urinary tract infection, site not specified: Secondary | ICD-10-CM

## 2024-10-12 DIAGNOSIS — I959 Hypotension, unspecified: Secondary | ICD-10-CM | POA: Diagnosis present

## 2024-10-12 DIAGNOSIS — E785 Hyperlipidemia, unspecified: Secondary | ICD-10-CM | POA: Diagnosis present

## 2024-10-12 DIAGNOSIS — R54 Age-related physical debility: Secondary | ICD-10-CM | POA: Diagnosis present

## 2024-10-12 DIAGNOSIS — W19XXXA Unspecified fall, initial encounter: Secondary | ICD-10-CM

## 2024-10-12 DIAGNOSIS — N179 Acute kidney failure, unspecified: Principal | ICD-10-CM | POA: Diagnosis present

## 2024-10-12 DIAGNOSIS — D6481 Anemia due to antineoplastic chemotherapy: Secondary | ICD-10-CM

## 2024-10-12 DIAGNOSIS — M542 Cervicalgia: Secondary | ICD-10-CM

## 2024-10-12 DIAGNOSIS — I82C19 Acute embolism and thrombosis of unspecified internal jugular vein: Secondary | ICD-10-CM

## 2024-10-12 DIAGNOSIS — J4540 Moderate persistent asthma, uncomplicated: Secondary | ICD-10-CM | POA: Diagnosis not present

## 2024-10-12 DIAGNOSIS — I82C12 Acute embolism and thrombosis of left internal jugular vein: Secondary | ICD-10-CM

## 2024-10-12 DIAGNOSIS — G4733 Obstructive sleep apnea (adult) (pediatric): Secondary | ICD-10-CM | POA: Diagnosis present

## 2024-10-12 DIAGNOSIS — Z923 Personal history of irradiation: Secondary | ICD-10-CM

## 2024-10-12 DIAGNOSIS — R112 Nausea with vomiting, unspecified: Secondary | ICD-10-CM

## 2024-10-12 DIAGNOSIS — C50919 Malignant neoplasm of unspecified site of unspecified female breast: Secondary | ICD-10-CM | POA: Diagnosis present

## 2024-10-12 DIAGNOSIS — E669 Obesity, unspecified: Secondary | ICD-10-CM | POA: Diagnosis present

## 2024-10-12 LAB — CBC WITH DIFFERENTIAL (CANCER CENTER ONLY)
Abs Immature Granulocytes: 0.04 K/uL (ref 0.00–0.07)
Basophils Absolute: 0 K/uL (ref 0.0–0.1)
Basophils Relative: 0 %
Eosinophils Absolute: 0 K/uL (ref 0.0–0.5)
Eosinophils Relative: 1 %
HCT: 32.9 % — ABNORMAL LOW (ref 36.0–46.0)
Hemoglobin: 11.1 g/dL — ABNORMAL LOW (ref 12.0–15.0)
Immature Granulocytes: 1 %
Lymphocytes Relative: 21 %
Lymphs Abs: 0.7 K/uL (ref 0.7–4.0)
MCH: 36.5 pg — ABNORMAL HIGH (ref 26.0–34.0)
MCHC: 33.7 g/dL (ref 30.0–36.0)
MCV: 108.2 fL — ABNORMAL HIGH (ref 80.0–100.0)
Monocytes Absolute: 0.3 K/uL (ref 0.1–1.0)
Monocytes Relative: 9 %
Neutro Abs: 2.2 K/uL (ref 1.7–7.7)
Neutrophils Relative %: 68 %
Platelet Count: 86 K/uL — ABNORMAL LOW (ref 150–400)
RBC: 3.04 MIL/uL — ABNORMAL LOW (ref 3.87–5.11)
RDW: 16.1 % — ABNORMAL HIGH (ref 11.5–15.5)
WBC Count: 3.3 K/uL — ABNORMAL LOW (ref 4.0–10.5)
nRBC: 0 % (ref 0.0–0.2)

## 2024-10-12 LAB — URINALYSIS, W/ REFLEX TO CULTURE (INFECTION SUSPECTED)
Bilirubin Urine: NEGATIVE
Glucose, UA: NEGATIVE mg/dL
Ketones, ur: NEGATIVE mg/dL
Nitrite: NEGATIVE
Protein, ur: 100 mg/dL — AB
Specific Gravity, Urine: 1.025 (ref 1.005–1.030)
pH: 5.5 (ref 5.0–8.0)

## 2024-10-12 LAB — CBC WITH DIFFERENTIAL/PLATELET
Abs Immature Granulocytes: 0.03 K/uL (ref 0.00–0.07)
Basophils Absolute: 0 K/uL (ref 0.0–0.1)
Basophils Relative: 0 %
Eosinophils Absolute: 0 K/uL (ref 0.0–0.5)
Eosinophils Relative: 1 %
HCT: 34.2 % — ABNORMAL LOW (ref 36.0–46.0)
Hemoglobin: 11.5 g/dL — ABNORMAL LOW (ref 12.0–15.0)
Immature Granulocytes: 1 %
Lymphocytes Relative: 31 %
Lymphs Abs: 0.9 K/uL (ref 0.7–4.0)
MCH: 36.3 pg — ABNORMAL HIGH (ref 26.0–34.0)
MCHC: 33.6 g/dL (ref 30.0–36.0)
MCV: 107.9 fL — ABNORMAL HIGH (ref 80.0–100.0)
Monocytes Absolute: 0.2 K/uL (ref 0.1–1.0)
Monocytes Relative: 7 %
Neutro Abs: 1.8 K/uL (ref 1.7–7.7)
Neutrophils Relative %: 60 %
Platelets: 88 K/uL — ABNORMAL LOW (ref 150–400)
RBC: 3.17 MIL/uL — ABNORMAL LOW (ref 3.87–5.11)
RDW: 15.9 % — ABNORMAL HIGH (ref 11.5–15.5)
WBC: 3 K/uL — ABNORMAL LOW (ref 4.0–10.5)
nRBC: 0 % (ref 0.0–0.2)

## 2024-10-12 LAB — COMPREHENSIVE METABOLIC PANEL WITH GFR
ALT: 19 U/L (ref 0–44)
AST: 36 U/L (ref 15–41)
Albumin: 3.6 g/dL (ref 3.5–5.0)
Alkaline Phosphatase: 106 U/L (ref 38–126)
Anion gap: 11 (ref 5–15)
BUN: 26 mg/dL — ABNORMAL HIGH (ref 6–20)
CO2: 27 mmol/L (ref 22–32)
Calcium: 8.8 mg/dL — ABNORMAL LOW (ref 8.9–10.3)
Chloride: 99 mmol/L (ref 98–111)
Creatinine, Ser: 1.26 mg/dL — ABNORMAL HIGH (ref 0.44–1.00)
GFR, Estimated: 50 mL/min — ABNORMAL LOW
Glucose, Bld: 68 mg/dL — ABNORMAL LOW (ref 70–99)
Potassium: 3.3 mmol/L — ABNORMAL LOW (ref 3.5–5.1)
Sodium: 137 mmol/L (ref 135–145)
Total Bilirubin: 0.3 mg/dL (ref 0.0–1.2)
Total Protein: 6.2 g/dL — ABNORMAL LOW (ref 6.5–8.1)

## 2024-10-12 LAB — CMP (CANCER CENTER ONLY)
ALT: 18 U/L (ref 0–44)
AST: 32 U/L (ref 15–41)
Albumin: 3.5 g/dL (ref 3.5–5.0)
Alkaline Phosphatase: 97 U/L (ref 38–126)
Anion gap: 15 (ref 5–15)
BUN: 31 mg/dL — ABNORMAL HIGH (ref 6–20)
CO2: 24 mmol/L (ref 22–32)
Calcium: 9 mg/dL (ref 8.9–10.3)
Chloride: 95 mmol/L — ABNORMAL LOW (ref 98–111)
Creatinine: 1.56 mg/dL — ABNORMAL HIGH (ref 0.44–1.00)
GFR, Estimated: 39 mL/min — ABNORMAL LOW
Glucose, Bld: 116 mg/dL — ABNORMAL HIGH (ref 70–99)
Potassium: 3.4 mmol/L — ABNORMAL LOW (ref 3.5–5.1)
Sodium: 133 mmol/L — ABNORMAL LOW (ref 135–145)
Total Bilirubin: 0.2 mg/dL (ref 0.0–1.2)
Total Protein: 6.2 g/dL — ABNORMAL LOW (ref 6.5–8.1)

## 2024-10-12 LAB — PROTIME-INR
INR: 1.3 — ABNORMAL HIGH (ref 0.8–1.2)
Prothrombin Time: 16.4 s — ABNORMAL HIGH (ref 11.4–15.2)

## 2024-10-12 LAB — RESP PANEL BY RT-PCR (RSV, FLU A&B, COVID)  RVPGX2
Influenza A by PCR: NEGATIVE
Influenza B by PCR: NEGATIVE
Resp Syncytial Virus by PCR: NEGATIVE
SARS Coronavirus 2 by RT PCR: NEGATIVE

## 2024-10-12 LAB — SAMPLE TO BLOOD BANK

## 2024-10-12 LAB — AMMONIA: Ammonia: <13 umol/L (ref 9–35)

## 2024-10-12 LAB — LACTATE DEHYDROGENASE: LDH: 188 U/L (ref 105–235)

## 2024-10-12 LAB — ETHANOL: Alcohol, Ethyl (B): <15 mg/dL

## 2024-10-12 LAB — CBG MONITORING, ED: Glucose-Capillary: 67 mg/dL — ABNORMAL LOW (ref 70–99)

## 2024-10-12 LAB — LACTIC ACID, PLASMA: Lactic Acid, Venous: 1.4 mmol/L (ref 0.5–1.9)

## 2024-10-12 MED ORDER — SODIUM CHLORIDE 0.9 % IV SOLN
2.0000 g | INTRAVENOUS | Status: DC
  Administered 2024-10-13 – 2024-10-15 (×3): 2 g via INTRAVENOUS
  Filled 2024-10-12 (×3): qty 20

## 2024-10-12 MED ORDER — PANTOPRAZOLE SODIUM 40 MG PO TBEC
40.0000 mg | DELAYED_RELEASE_TABLET | Freq: Every day | ORAL | Status: DC
  Administered 2024-10-13 – 2024-10-15 (×3): 40 mg via ORAL
  Filled 2024-10-12 (×3): qty 1

## 2024-10-12 MED ORDER — RIVAROXABAN 10 MG PO TABS
10.0000 mg | ORAL_TABLET | Freq: Every day | ORAL | Status: DC
  Administered 2024-10-13 – 2024-10-15 (×3): 10 mg via ORAL
  Filled 2024-10-12 (×3): qty 1

## 2024-10-12 MED ORDER — LACTATED RINGERS IV BOLUS
1000.0000 mL | Freq: Once | INTRAVENOUS | Status: AC
  Administered 2024-10-12: 1000 mL via INTRAVENOUS

## 2024-10-12 MED ORDER — POTASSIUM CHLORIDE IN NACL 20-0.9 MEQ/L-% IV SOLN
INTRAVENOUS | Status: DC
  Filled 2024-10-12 (×2): qty 1000

## 2024-10-12 MED ORDER — IOHEXOL 300 MG/ML  SOLN
80.0000 mL | Freq: Once | INTRAMUSCULAR | Status: AC | PRN
  Administered 2024-10-12: 80 mL via INTRAVENOUS

## 2024-10-12 MED ORDER — ALBUTEROL SULFATE (2.5 MG/3ML) 0.083% IN NEBU: 2.5000 mg | INHALATION_SOLUTION | Freq: Four times a day (QID) | RESPIRATORY_TRACT | Status: DC | PRN

## 2024-10-12 MED ORDER — FENTANYL CITRATE (PF) 50 MCG/ML IJ SOSY: 12.5000 ug | PREFILLED_SYRINGE | INTRAMUSCULAR | Status: DC | PRN

## 2024-10-12 MED ORDER — FAMOTIDINE IN NACL 20-0.9 MG/50ML-% IV SOLN
40.0000 mg | Freq: Once | INTRAVENOUS | Status: AC
  Administered 2024-10-12: 40 mg via INTRAVENOUS
  Filled 2024-10-12: qty 100

## 2024-10-12 MED ORDER — DEXTROSE 50 % IV SOLN
1.0000 | Freq: Once | INTRAVENOUS | Status: AC
  Administered 2024-10-12: 50 mL via INTRAVENOUS
  Filled 2024-10-12: qty 50

## 2024-10-12 MED ORDER — LACTATED RINGERS IV SOLN: INTRAVENOUS | Status: AC

## 2024-10-12 MED ORDER — SODIUM CHLORIDE 0.9% FLUSH
3.0000 mL | Freq: Two times a day (BID) | INTRAVENOUS | Status: DC
  Administered 2024-10-12 – 2024-10-15 (×6): 3 mL via INTRAVENOUS

## 2024-10-12 MED ORDER — QUETIAPINE FUMARATE 200 MG PO TABS
800.0000 mg | ORAL_TABLET | Freq: Every day | ORAL | Status: AC
  Administered 2024-10-12 – 2024-10-14 (×3): 800 mg via ORAL
  Filled 2024-10-12 (×3): qty 4

## 2024-10-12 MED ORDER — GABAPENTIN 400 MG PO CAPS
400.0000 mg | ORAL_CAPSULE | Freq: Two times a day (BID) | ORAL | Status: DC
  Administered 2024-10-12 – 2024-10-15 (×6): 400 mg via ORAL
  Filled 2024-10-12 (×6): qty 1

## 2024-10-12 MED ORDER — ACETAMINOPHEN 650 MG RE SUPP: 650.0000 mg | Freq: Four times a day (QID) | RECTAL | Status: DC | PRN

## 2024-10-12 MED ORDER — AZITHROMYCIN 250 MG PO TABS
500.0000 mg | ORAL_TABLET | Freq: Every day | ORAL | Status: DC
  Administered 2024-10-13 – 2024-10-14 (×2): 500 mg via ORAL
  Filled 2024-10-12 (×2): qty 2

## 2024-10-12 MED ORDER — ONDANSETRON HCL 4 MG/2ML IJ SOLN
4.0000 mg | Freq: Once | INTRAMUSCULAR | Status: AC
  Administered 2024-10-12: 4 mg via INTRAVENOUS
  Filled 2024-10-12: qty 2

## 2024-10-12 MED ORDER — METOPROLOL SUCCINATE ER 25 MG PO TB24: 12.5000 mg | ORAL_TABLET | Freq: Every day | ORAL | Status: DC

## 2024-10-12 MED ORDER — BISACODYL 5 MG PO TBEC: 5.0000 mg | DELAYED_RELEASE_TABLET | Freq: Every day | ORAL | Status: DC | PRN

## 2024-10-12 MED ORDER — SENNA 8.6 MG PO TABS
1.0000 | ORAL_TABLET | Freq: Two times a day (BID) | ORAL | Status: DC
  Administered 2024-10-12 – 2024-10-15 (×6): 8.6 mg via ORAL
  Filled 2024-10-12 (×6): qty 1

## 2024-10-12 MED ORDER — ACETAMINOPHEN 325 MG PO TABS: 650.0000 mg | ORAL_TABLET | Freq: Four times a day (QID) | ORAL | Status: DC | PRN

## 2024-10-12 MED ORDER — VANCOMYCIN HCL IN DEXTROSE 1-5 GM/200ML-% IV SOLN
1000.0000 mg | Freq: Once | INTRAVENOUS | Status: AC
  Administered 2024-10-12: 1000 mg via INTRAVENOUS
  Filled 2024-10-12: qty 200

## 2024-10-12 MED ORDER — INSULIN PUMP
Freq: Three times a day (TID) | SUBCUTANEOUS | Status: DC
  Filled 2024-10-12: qty 1

## 2024-10-12 MED ORDER — SODIUM CHLORIDE 0.9 % IV SOLN: Freq: Once | INTRAVENOUS | Status: AC

## 2024-10-12 MED ORDER — GUAIFENESIN 100 MG/5ML PO LIQD: 5.0000 mL | ORAL | Status: DC | PRN

## 2024-10-12 MED ORDER — FENTANYL CITRATE (PF) 50 MCG/ML IJ SOSY
25.0000 ug | PREFILLED_SYRINGE | Freq: Once | INTRAMUSCULAR | Status: AC
  Administered 2024-10-12: 25 ug via INTRAVENOUS
  Filled 2024-10-12: qty 1

## 2024-10-12 MED ORDER — SODIUM CHLORIDE 0.9 % IV SOLN
500.0000 mg | Freq: Once | INTRAVENOUS | Status: AC
  Administered 2024-10-12: 500 mg via INTRAVENOUS
  Filled 2024-10-12: qty 5

## 2024-10-12 MED ORDER — OXYCODONE HCL 5 MG PO TABS
5.0000 mg | ORAL_TABLET | ORAL | Status: DC | PRN
  Administered 2024-10-12 – 2024-10-14 (×6): 5 mg via ORAL
  Filled 2024-10-12 (×6): qty 1

## 2024-10-12 MED ORDER — SODIUM CHLORIDE 0.9 % IV SOLN
2.0000 g | Freq: Once | INTRAVENOUS | Status: AC
  Administered 2024-10-12: 2 g via INTRAVENOUS
  Filled 2024-10-12: qty 12.5

## 2024-10-12 MED ORDER — POLYETHYLENE GLYCOL 3350 17 G PO PACK: 17.0000 g | PACK | Freq: Every day | ORAL | Status: DC | PRN

## 2024-10-12 MED ORDER — ROSUVASTATIN CALCIUM 20 MG PO TABS
40.0000 mg | ORAL_TABLET | Freq: Every day | ORAL | Status: DC
  Administered 2024-10-13 – 2024-10-15 (×3): 40 mg via ORAL
  Filled 2024-10-12 (×3): qty 2

## 2024-10-12 MED ORDER — ONDANSETRON HCL 4 MG/2ML IJ SOLN: 4.0000 mg | Freq: Four times a day (QID) | INTRAMUSCULAR | Status: DC | PRN

## 2024-10-12 MED ORDER — ONDANSETRON HCL 4 MG PO TABS: 4.0000 mg | ORAL_TABLET | Freq: Four times a day (QID) | ORAL | Status: DC | PRN

## 2024-10-12 MED ORDER — LORAZEPAM 2 MG/ML IJ SOLN
0.5000 mg | Freq: Once | INTRAMUSCULAR | Status: AC
  Administered 2024-10-12: 0.5 mg via INTRAVENOUS
  Filled 2024-10-12: qty 1

## 2024-10-12 NOTE — ED Notes (Signed)
 Pt. Is more alert at present time.  Pt. Husband is at bedside.

## 2024-10-12 NOTE — ED Provider Notes (Signed)
 " [facility] Provider Note   CSN: [id number] Arrival date & time: 10/12/24  1536     Patient presents with: Altered Mental Status and Hypotension   Ariana Herrera is a 58 y.o. female.   58 year old female history of asthma, anxiety, breast cancer currently undergoing treatment, diabetes, and MI who presents to the emergency department with nausea vomiting hypotension and confusion.  Called by the patient's oncologist Dr. Timmy who reported that she was in clinic today.  She was complaining of nausea and vomiting and was hypotensive so was given some fluids as well as Ativan .  Patient reportedly got more confused.  He was concerned about a possible brain mets who referred her to the emergency department for CT of the head.  Patient also reports to me that she has a family member that sick at home and she has had a cough and congestion recently.  Also has been having diarrhea.  Has some abdominal discomfort but is unable to further characterize.  Did say she was drinking some alcohol before coming into the emergency department as well.       Prior to Admission medications  Medication Sig Start Date End Date Taking? Authorizing Provider  ACCU-CHEK GUIDE test strip 3 (three) times daily. 07/17/21   [provider]  albuterol  (ACCUNEB ) 1.25 MG/3ML nebulizer solution Take 1 ampule by nebulization every 6 (six) hours as needed for wheezing or shortness of breath. 02/27/23   [provider]  albuterol  (VENTOLIN  HFA) 108 (90 Base) MCG/ACT inhaler Inhale 2 puffs into the lungs every 6 (six) hours as needed for wheezing or shortness of breath. 06/23/23   Drubel, Manuelita, PA-C  Continuous Glucose Sensor (DEXCOM G7 SENSOR) MISC Change sensor every 10 days 02/20/24   [provider]  fenofibrate  (TRICOR ) 145 MG tablet Take 145 mg by mouth daily. 11/22/21   [provider]  gabapentin  (NEURONTIN ) 400 MG capsule Take 1 capsule  (400 mg total) by mouth 3 (three) times daily. Patient taking differently: Take 400 mg by mouth 2 (two) times daily. 05/30/24   Tonette Lauraine HERO, PA-C  HYDROcodone -acetaminophen  (NORCO) 7.5-325 MG tablet Take 1 tablet by mouth every 6 (six) hours as needed for moderate pain (pain score 4-6). 10/03/24   Timmy Maude SAUNDERS, MD  insulin  aspart (NOVOLOG ) 100 UNIT/ML injection Use as directed via insulin  pump. Total daily dose 100 units. 05/29/24     Insulin  Disposable Pump (OMNIPOD 5 [id number] PODS GEN 5) MISC Apply 1 pod every 3 days for insulin  administration. 02/20/24     lidocaine -prilocaine  (EMLA ) cream Apply 1 Application topically daily as needed. 05/08/24   Tonette Lauraine HERO, PA-C  LINZESS  290 MCG CAPS capsule TAKE 1 CAPSULE BY MOUTH DAILY BEFORE BREAKFAST. 08/06/24   Federico Rosario BROCKS, MD  lubiprostone  (AMITIZA ) 24 MCG capsule Take 1 capsule (24 mcg total) by mouth 2 (two) times daily with a meal. 05/11/24   Federico Rosario BROCKS, MD  metFORMIN  (GLUCOPHAGE ) 1000 MG tablet Take 1 tablet (1,000 mg total) by mouth in the morning and in the evening with meals. 05/29/24     metoprolol  succinate (TOPROL -XL) 25 MG 24 hr tablet Take 12.5 mg by mouth daily. 09/29/24   [provider]  nitroGLYCERIN  (NITROSTAT ) 0.4 MG SL tablet Place 1 tablet (0.4 mg total) under the tongue every 5 (five) minutes as needed for chest pain. 08/21/24   Vicci Rollo SAUNDERS, PA-C  NOVOLOG  100 UNIT/ML injection Inject into the skin as directed.  03/20/24   [provider]  OLANZapine  (ZYPREXA ) 10 MG tablet TAKE 1 TABLET BY MOUTH EVERYDAY AT BEDTIME 03/16/24   Ennever, Maude SAUNDERS, MD  ondansetron  (ZOFRAN -ODT) 8 MG disintegrating tablet Take 1 tablet (8 mg total) by mouth every 8 (eight) hours as needed for nausea or vomiting. 08/16/24   Almarie Waddell NOVAK, NP  oxybutynin  (DITROPAN -XL) 5 MG 24 hr tablet TAKE 1 TABLET BY MOUTH EVERYDAY AT BEDTIME 03/12/24   Drubel, Manuelita, PA-C  pantoprazole  (PROTONIX ) 40 MG tablet Take 1 tablet (40 mg total)  by mouth daily. 05/07/24   Federico Rosario BROCKS, MD  polyethylene glycol (MIRALAX  / GLYCOLAX ) 17 g packet Take 17 g by mouth daily as needed. 10/12/23   Drusilla Sabas RAMAN, MD  prochlorperazine  (COMPAZINE ) 10 MG tablet Take 1 tablet (10 mg total) by mouth every 6 (six) hours as needed for nausea or vomiting (Zofran  resumes 12/16). 10/12/23   Drusilla Sabas RAMAN, MD  QUEtiapine  (SEROQUEL ) 400 MG tablet Take 2 tablets (800 mg total) by mouth at bedtime. 08/16/24   Almarie Waddell NOVAK, NP  rivaroxaban  (XARELTO ) 10 MG TABS tablet Take 1 tablet (10 mg total) by mouth daily. 03/06/24   Tonette Lauraine HERO, PA-C  rosuvastatin  (CRESTOR ) 40 MG tablet Take 1 tablet (40 mg total) by mouth daily. 09/29/23   Vicci Rollo SAUNDERS, PA-C  talazoparib tosylate  (TALZENNA ) 0.35 MG capsule Take 1 capsule (0.35 mg total) by mouth daily. Take for 21 days on, then off for 7 days. Repeat every 28 days. 08/16/24   Timmy Maude SAUNDERS, MD    Allergies: Lithium, Dulaglutide, Nitrofuran derivatives, and Penicillins    Review of Systems  Updated Vital Signs BP 103/75   Pulse 96   Temp 97.6 F (36.4 C) (Oral)   Resp 16   LMP 10/04/2016 Comment: after procedure to stop bleeding  SpO2 96%   Physical Exam Vitals and nursing note reviewed.  Constitutional:      General: She is not in acute distress.    Appearance: She is well-developed.  HENT:     Head: Normocephalic and atraumatic.     Right Ear: External ear normal.     Left Ear: External ear normal.     Nose: Nose normal.  Eyes:     Extraocular Movements: Extraocular movements intact.     Conjunctiva/sclera: Conjunctivae normal.     Pupils: Pupils are equal, round, and reactive to light.  Cardiovascular:     Rate and Rhythm: Normal rate and regular rhythm.     Heart sounds: No murmur heard. Pulmonary:     Effort: Pulmonary effort is normal. No respiratory distress.     Breath sounds: Normal breath sounds.  Abdominal:     General: Abdomen is flat. There is distension.      Palpations: Abdomen is soft. There is no mass.     Tenderness: There is abdominal tenderness (Minimal epigastric and periumbilical). There is no guarding.  Musculoskeletal:     Cervical back: Normal range of motion and neck supple.     Right lower leg: No edema.     Left lower leg: No edema.  Skin:    General: Skin is warm and dry.  Neurological:     Mental Status: She is alert. Mental status is at baseline. She is disoriented.     Cranial Nerves: No cranial nerve deficit.     Sensory: No sensory deficit.     Motor: No weakness.     Comments: Appears to be confused  Psychiatric:        Mood and Affect: Mood normal.     (all labs ordered are listed, but only abnormal results are displayed) Labs Reviewed  COMPREHENSIVE METABOLIC PANEL WITH GFR - Abnormal; Notable for the following components:      Result Value   Potassium 3.3 (*)    Glucose, Bld 68 (*)    BUN 26 (*)    Creatinine, Ser 1.26 (*)    Calcium  8.8 (*)    Total Protein 6.2 (*)    GFR, Estimated 50 (*)    All other components within normal limits  CBC WITH DIFFERENTIAL/PLATELET - Abnormal; Notable for the following components:   WBC 3.0 (*)    RBC 3.17 (*)    Hemoglobin 11.5 (*)    HCT 34.2 (*)    MCV 107.9 (*)    MCH 36.3 (*)    RDW 15.9 (*)    Platelets 88 (*)    All other components within normal limits  URINALYSIS, W/ REFLEX TO CULTURE (INFECTION SUSPECTED) - Abnormal; Notable for the following components:   APPearance CLOUDY (*)    Hgb urine dipstick TRACE (*)    Protein, ur 100 (*)    Leukocytes,Ua LARGE (*)    Bacteria, UA MANY (*)    All other components within normal limits  PROTIME-INR - Abnormal; Notable for the following components:   Prothrombin Time 16.4 (*)    INR 1.3 (*)    All other components within normal limits  CBG MONITORING, ED - Abnormal; Notable for the following components:   Glucose-Capillary 67 (*)    All other components within normal limits  RESP PANEL BY RT-PCR (RSV, FLU  A&B, COVID)  RVPGX2  CULTURE, BLOOD (ROUTINE X 2)  CULTURE, BLOOD (ROUTINE X 2)  URINE CULTURE  LACTIC ACID, PLASMA  ETHANOL  AMMONIA  URINALYSIS, ROUTINE W REFLEX MICROSCOPIC    EKG: None  Radiology: CT CHEST ABDOMEN PELVIS W CONTRAST Result Date: 10/12/2024 EXAM: CT CHEST, ABDOMEN AND PELVIS WITH CONTRAST 10/12/2024 05:50:57 PM TECHNIQUE: CT of the chest, abdomen and pelvis was performed with the administration of intravenous contrast. Multiplanar reformatted images are provided for review. Automated exposure control, iterative reconstruction, and/or weight based adjustment of the mA/kV was utilized to reduce the radiation dose to as low as reasonably achievable. COMPARISON: Comparison date: 10/03/2024. CLINICAL HISTORY: Cough, n/v/d, hypotension, cancer hx. FINDINGS: CHEST: MEDIASTINUM AND LYMPH NODES: Right chest port catheter tip ends in the SVC. There are atherosclerotic calcifications of the aorta. LUNGS AND PLEURA: New atelectasis/consolidation in the lingula. Post radiation changes are again seen in the anterior inferior left upper lobe. There is a 4 mm left upper lobe nodule on image 7/40 which has increased in size. There is some very faint ground glass opacities diffusely throughout both lungs. There is a new right upper lobe pulmonary nodule image 7/69 measuring 4 mm. ABDOMEN AND PELVIS: LIVER: The liver is unremarkable. GALLBLADDER AND BILE DUCTS: Gallbladder is surgically absent. SPLEEN: No acute abnormality. PANCREAS: No acute abnormality. ADRENAL GLANDS: No acute abnormality. KIDNEYS, URETERS AND BLADDER: Punctate calculi in the left kidney are unchanged. No hydronephrosis. No perinephric or periureteral stranding. Urinary bladder is unremarkable. GI AND BOWEL: There is a very large amount of stool throughout the entire colon. There are some air fluid levels in the proximal colon. There are some relative areas of narrowing throughout the transverse colon which may represent  peristalsis, but stricture or mass not excluded. There is no bowel obstruction.  Appendix appears normal. There are also scattered air fluid levels throughout distended distal ileal loops. Jejunum and stomach are within normal limits. REPRODUCTIVE ORGANS: No acute abnormality. PERITONEUM AND RETROPERITONEUM: No ascites. No free air. VASCULATURE: Aorta is normal in caliber, but there are atherosclerotic calcifications of the aorta and iliac arteries. ABDOMINAL AND PELVIS LYMPH NODES: No lymphadenopathy. BONES AND SOFT TISSUES: There is wall thickening throughout the esophagus. There is skin thickening of the left breast with diffuse edema throughout the left breast. Surgical clips and calcifications are again noted in the left breast. Medication injection sites are noted in the subcutaneous tissues of the anterior abdominal wall. IMPRESSION: 1. New 4 mm right upper lobe pulmonary nodule and increased size of the 4 mm left upper lobe nodule, concerning for metastatic disease. 2. New atelectasis/consolidation in the lingula. 3. Very faint ground glass opacities diffusely throughout both lungs. Findings may be infectious/inflammatory or related to mild edema. 4. Wall thickening throughout the esophagus. Correlate clinically for esophagitis. 5. Very large amount of stool throughout the entire colon with some air-fluid levels in the proximal colon, without bowel obstruction. Findings are concerning for colonic ileus and/or constipation. 6. Similar left breast skin thickening and edema with surgical clips and calcifications. 7. Left renal calculi. Electronically signed by: Greig Pique MD 10/12/2024 06:32 PM EST RP Workstation: [id number]   CT Head Wo Contrast Result Date: 10/12/2024 CLINICAL DATA:  Altered mental status. Weakness and confusion. Active chemotherapy and radiation for cancer. EXAM: CT HEAD WITHOUT CONTRAST TECHNIQUE: Contiguous axial images were obtained from the base of the skull through the vertex  without intravenous contrast. RADIATION DOSE REDUCTION: This exam was performed according to the departmental dose-optimization program which includes automated exposure control, adjustment of the mA and/or kV according to patient size and/or use of iterative reconstruction technique. COMPARISON:  Brain MRI 03/02/2024 FINDINGS: Brain: No acute intracranial hemorrhage. No evidence of acute ischemia. No hydrocephalus, the basilar cisterns are patent. The subcentimeter dural-based lesion overlying the left middle frontal gyrus on prior MRI has no CT correlate. No evidence of intracranial mass or edema. No midline shift. No subdural or extra-axial collection. Vascular: No hyperdense vessel or unexpected calcification. Skull: No fracture or focal lesion. Sinuses/Orbits: Trace mucosal thickening involving right side of sphenoid sinus. Occasional mucosal thickening of ethmoid air cells. No mastoid effusion. Bilateral lens resection. Other: None. IMPRESSION: No acute intracranial abnormality. Electronically Signed   By: Andrea Gasman M.D.   On: 10/12/2024 18:26     Procedures   Medications Ordered in the ED  lactated ringers  infusion (has no administration in time range)  vancomycin (VANCOCIN) IVPB 1000 mg/200 mL premix (1,000 mg Intravenous New Bag/Given 10/12/24 1838)  azithromycin  (ZITHROMAX ) 500 mg in sodium chloride  0.9 % 250 mL IVPB (500 mg Intravenous New Bag/Given 10/12/24 1918)  lactated ringers  bolus 1,000 mL (0 mLs Intravenous Stopped 10/12/24 1838)  ondansetron  (ZOFRAN ) injection 4 mg (4 mg Intravenous Given 10/12/24 1711)  dextrose  50 % solution 50 mL (50 mLs Intravenous Given 10/12/24 1712)  ceFEPIme (MAXIPIME) 2 g in sodium chloride  0.9 % 100 mL IVPB (0 g Intravenous Stopped 10/12/24 1752)  lactated ringers  bolus 1,000 mL (1,000 mLs Intravenous New Bag/Given 10/12/24 1841)  iohexol  (OMNIPAQUE ) 300 MG/ML solution 80 mL (80 mLs Intravenous Contrast Given 10/12/24 1731)  fentaNYL  (SUBLIMAZE )  injection 25 mcg (25 mcg Intravenous Given 10/12/24 1830)    Clinical Course as of 10/12/24 1931  Fri Oct 12, 2024  1748 Creatinine(!): 1.26 Baseline 0.8 [RP]  1748  NEUT#: 1.8 [RP]  1852 CT CHEST ABDOMEN PELVIS W CONTRAST IMPRESSION: 1. New 4 mm right upper lobe pulmonary nodule and increased size of the 4 mm left upper lobe nodule, concerning for metastatic disease. 2. New atelectasis/consolidation in the lingula. 3. Very faint ground glass opacities diffusely throughout both lungs. Findings may be infectious/inflammatory or related to mild edema. 4. Wall thickening throughout the esophagus. Correlate clinically for esophagitis. 5. Very large amount of stool throughout the entire colon with some air-fluid levels in the proximal colon, without bowel obstruction. Findings are concerning for colonic ileus and/or constipation.   [RP]  1916 Dw Dr Doc [RP]  [phone] Patient updated regarding pulmonary nodule.  Says she that she was already aware of this [RP]    Clinical Course User Index [RP] Yolande Lamar BROCKS, MD                                 Medical Decision Making Amount and/or Complexity of Data Reviewed Labs: ordered. Decision-making details documented in ED Course. Radiology: ordered. Decision-making details documented in ED Course.  Risk Prescription drug management. Decision regarding hospitalization.   Ariana Herrera is a 57 year old female history of asthma, anxiety, breast cancer currently undergoing treatment, diabetes, and MI who presents to the emergency department with nausea vomiting hypotension and confusion.   Initial Ddx:  Brain met, ICH, gastroenteritis, URI, pneumonia, bowel obstruction, colitis  MDM/Course:  Patient resents emergency department with multiple complaints.  She is having nausea, vomiting, and some confusion.  Also was hypotensive prior to arrival.  On exam is somewhat confused.  Is also hypotensive and appears dehydrated.  Does have  abdominal tenderness to palpation.  She satting well on room air and has lung sounds are clear to auscultation bilaterally.  No obvious neurologic deficits.  CT head without acute findings.  She had a CT of the chest, abdomen, and pelvis which shows that she has pneumonia, possible esophagitis, and possible colonic ileus.  Lab work shows that she has an AKI.  Also appears to have a urinary tract infection.  She was started on broad-spectrum antibiotics and given 30 mL/kg bolus.  Her lactic acid was normal.  Upon re-evaluation she was overall well-appearing but was found to be retaining urine so Foley catheter was placed.  Admitted to hospitalist for further management  This patient presents to the ED for concern of complaints listed in HPI, this involves an extensive number of treatment options, and is a complaint that carries with it a high risk of complications and morbidity. Disposition including potential need for admission considered.   Dispo: Admit to Floor  I have reviewed the patients home medications and made adjustments as needed Additional history obtained from significant other Records reviewed Outpatient Clinic Notes The following labs were independently interpreted: Chemistry and show AKI I independently reviewed the following imaging with scope of interpretation limited to determining acute life threatening conditions related to emergency care: CT Head and agree with the radiologist interpretation with the following exceptions: none I personally reviewed and interpreted cardiac monitoring: normal sinus rhythm  I personally reviewed and interpreted the pt's EKG: see above for interpretation   Portions of this note were generated with Dragon dictation software. Dictation errors may occur despite best attempts at proofreading.     Final diagnoses:  AKI (acute kidney injury)  Lower urinary tract infectious disease  Hypotension, unspecified hypotension type  Dehydration  Pneumonia  due  to infectious organism, unspecified laterality, unspecified part of lung  Urinary retention    ED Discharge Orders     None          Yolande Lamar BROCKS, MD 10/12/24 1920  "

## 2024-10-12 NOTE — ED Notes (Signed)
 [facility] called for transport.

## 2024-10-12 NOTE — Sepsis Progress Note (Signed)
 Sepsis protocol monitored by [facility]

## 2024-10-12 NOTE — H&P (Signed)
 " History and Physical    Ariana Herrera FMW:[id number] DOB: Jul 29, 1966 DOA: 10/12/2024  PCP: Almarie Waddell NOVAK, NP   Patient coming from: Home   Chief Complaint: Productive cough, loss of appetite, lightheadedness, fatigue, low BP   HPI: Ariana Herrera is a 58 y.o. 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  Patient reports that she began feeling generally poor 3 weeks ago with cough, fatigue, and loss of appetite.  She has ongoing cough which has become productive of thick yellow sputum ongoing loss of appetite, and has developed lightheadedness with multiple falls.  She denies any abdominal pain but has vomited a few times when attempting to eat a full meal and has had some loose stools that she has treated with Imodium .  She was seen at the cancer center today where she was noted to have a blood pressure of 76/44.  She was given 500 mL of IVF, Ativan , and sent to the ED.  [facility] ED Course: Upon arrival to the ED, patient is found to be afebrile and saturating well on room air with normal RR, normal HR, and BP 85/66.  Labs are most notable for creatinine 1.56, WBC 3300, hemoglobin 11.1, platelets 86,000, normal lactic acid, and negative respiratory virus panel.  There were no acute findings on head CT.  CT of the chest, abdomen, and pelvis reveals new right upper lobe nodule, increased size of left upper lobe nodule, new atelectasis or pneumonia in the lingula, faint ground glass opacities in the bilateral lungs, esophageal wall thickening, and very large amount of stool throughout the colon.  Blood and urine cultures were collected in the ED and the patient was given 2 L LR, Zofran , fentanyl , vancomycin, cefepime, and azithromycin .  She was retaining urine and had Foley  catheter placed in the ED.  She was transferred to [facility] for admission.  Review of Systems:  All other systems reviewed and apart from HPI, are negative.  Past Medical History:  Diagnosis Date   Anginal pain    Anxiety    Arthritis    Asthma    Bipolar 1 disorder (HCC)    Bipolar disease, chronic (HCC)    Breast cancer in female (HCC)    2019 and recurrent in 2023 to right breast, right axillary, and left neck   Coronary artery disease    Deep vein blood clot of right lower extremity (HCC)    Diabetes mellitus type 2 in obese    Goals of care, counseling/discussion 11/23/2021   History of external beam radiation therapy    Left neck-10/07/23-11/24/23-[facility]   Hyperlipidemia    Lymphedema    MI (myocardial infarction) (HCC)    was in [place]    Obesity    Pancreatitis    Personal history of chemotherapy    Personal history of radiation therapy    Sleep apnea     Past Surgical History:  Procedure Laterality Date   BREAST BIOPSY Left 10/03/2023   times 2   BREAST LUMPECTOMY Left 03/2019   CATARACT EXTRACTION Bilateral    CHOLECYSTECTOMY     IR IMAGING GUIDED PORT INSERTION  12/15/2021   IR PATIENT EVAL TECH 0-60 MINS  10/06/2023   IR REMOVAL TUN ACCESS W/ PORT W/O FL MOD SED  12/20/2019   IR US  GUIDE BX  ASP/DRAIN  11/02/2021   LEFT HEART CATH AND CORONARY ANGIOGRAPHY N/A 08/22/2020   Procedure: LEFT HEART CATH AND CORONARY ANGIOGRAPHY;  Surgeon: Verlin Lonni BIRCH, MD;  Location: [facility] INVASIVE CV LAB;  Service: Cardiovascular;  Laterality: N/A;   TRIGGER FINGER RELEASE Bilateral    x 5   TUBAL LIGATION     UMBILICAL HERNIA REPAIR     x 2   uterine ablation      Social History:   reports that she has been smoking cigarettes. She started smoking about 39 years ago. She has a 40 pack-year smoking history. She has never used smokeless tobacco. She reports current drug use. Drug: Marijuana. She reports that she does not drink  alcohol.  Allergies[1]  Family History  Problem Relation Age of Onset   Heart attack Mother 51   Diabetes Mother    Breast cancer Mother    Breast cancer Cousin    Colon cancer Neg Hx    Esophageal cancer Neg Hx    Pancreatic cancer Neg Hx    Stomach cancer Neg Hx      Prior to Admission medications  Medication Sig Start Date End Date Taking? Authorizing Provider  ACCU-CHEK GUIDE test strip 3 (three) times daily. 07/17/21   [provider]  albuterol  (ACCUNEB ) 1.25 MG/3ML nebulizer solution Take 1 ampule by nebulization every 6 (six) hours as needed for wheezing or shortness of breath. 02/27/23   [provider]  albuterol  (VENTOLIN  HFA) 108 (90 Base) MCG/ACT inhaler Inhale 2 puffs into the lungs every 6 (six) hours as needed for wheezing or shortness of breath. 06/23/23   Drubel, Manuelita, PA-C  Continuous Glucose Sensor (DEXCOM G7 SENSOR) MISC Change sensor every 10 days 02/20/24   [provider]  fenofibrate  (TRICOR ) 145 MG tablet Take 145 mg by mouth daily. 11/22/21   [provider]  gabapentin  (NEURONTIN ) 400 MG capsule Take 1 capsule (400 mg total) by mouth 3 (three) times daily. Patient taking differently: Take 400 mg by mouth 2 (two) times daily. 05/30/24   Tonette Lauraine HERO, PA-C  HYDROcodone -acetaminophen  (NORCO) 7.5-325 MG tablet Take 1 tablet by mouth every 6 (six) hours as needed for moderate pain (pain score 4-6). 10/03/24   Timmy Maude SAUNDERS, MD  insulin  aspart (NOVOLOG ) 100 UNIT/ML injection Use as directed via insulin  pump. Total daily dose 100 units. 05/29/24     Insulin  Disposable Pump (OMNIPOD 5 [id number] PODS GEN 5) MISC Apply 1 pod every 3 days for insulin  administration. 02/20/24     lidocaine -prilocaine  (EMLA ) cream Apply 1 Application topically daily as needed. 05/08/24   Tonette Lauraine HERO, PA-C  LINZESS  290 MCG CAPS capsule TAKE 1 CAPSULE BY MOUTH DAILY BEFORE BREAKFAST. 08/06/24   Federico Rosario BROCKS, MD  lubiprostone  (AMITIZA ) 24 MCG  capsule Take 1 capsule (24 mcg total) by mouth 2 (two) times daily with a meal. 05/11/24   Federico Rosario BROCKS, MD  metFORMIN  (GLUCOPHAGE ) 1000 MG tablet Take 1 tablet (1,000 mg total) by mouth in the morning and in the evening with meals. 05/29/24     metoprolol  succinate (TOPROL -XL) 25 MG 24 hr tablet Take 12.5 mg by mouth daily. 09/29/24   [provider]  nitroGLYCERIN  (NITROSTAT ) 0.4 MG SL tablet Place 1 tablet (0.4 mg total) under the tongue every 5 (five) minutes as needed for chest pain. 08/21/24   Vicci Rollo SAUNDERS, PA-C  NOVOLOG  100 UNIT/ML injection Inject into the skin as directed. 03/20/24   [provider]  OLANZapine  (ZYPREXA )  10 MG tablet TAKE 1 TABLET BY MOUTH EVERYDAY AT BEDTIME 03/16/24   Timmy Maude SAUNDERS, MD  ondansetron  (ZOFRAN -ODT) 8 MG disintegrating tablet Take 1 tablet (8 mg total) by mouth every 8 (eight) hours as needed for nausea or vomiting. 08/16/24   Almarie Waddell NOVAK, NP  oxybutynin  (DITROPAN -XL) 5 MG 24 hr tablet TAKE 1 TABLET BY MOUTH EVERYDAY AT BEDTIME 03/12/24   Drubel, Manuelita, PA-C  pantoprazole  (PROTONIX ) 40 MG tablet Take 1 tablet (40 mg total) by mouth daily. 05/07/24   Federico Rosario BROCKS, MD  polyethylene glycol (MIRALAX  / GLYCOLAX ) 17 g packet Take 17 g by mouth daily as needed. 10/12/23   Drusilla Sabas RAMAN, MD  prochlorperazine  (COMPAZINE ) 10 MG tablet Take 1 tablet (10 mg total) by mouth every 6 (six) hours as needed for nausea or vomiting (Zofran  resumes 12/16). 10/12/23   Drusilla Sabas RAMAN, MD  QUEtiapine  (SEROQUEL ) 400 MG tablet Take 2 tablets (800 mg total) by mouth at bedtime. 08/16/24   Almarie Waddell NOVAK, NP  rivaroxaban  (XARELTO ) 10 MG TABS tablet Take 1 tablet (10 mg total) by mouth daily. 03/06/24   Tonette Lauraine HERO, PA-C  rosuvastatin  (CRESTOR ) 40 MG tablet Take 1 tablet (40 mg total) by mouth daily. 09/29/23   Vicci Rollo SAUNDERS, PA-C  talazoparib tosylate  (TALZENNA ) 0.35 MG capsule Take 1 capsule (0.35 mg total) by mouth daily. Take for 21 days on, then  off for 7 days. Repeat every 28 days. 08/16/24   Timmy Maude SAUNDERS, MD    Physical Exam: Vitals:   10/12/24 1829 10/12/24 1934 10/12/24 1941 10/12/24 2112  BP: 103/75 (!) 117/56  (!) 124/57  Pulse: 96 90  96  Resp:  20  20  Temp:   97.7 F (36.5 C) 98 F (36.7 C)  TempSrc:   Oral Oral  SpO2: 96% 98%  97%     Constitutional: NAD, no pallor or diaphoresis  Eyes: PERTLA, lids and conjunctivae normal ENMT: Mucous membranes are moist. Posterior pharynx clear of any exudate or lesions.   Neck: supple, no masses  Respiratory: Coarse rales. No wheezing. Speaking in full sentences.    Cardiovascular: S1 & S2 heard, regular rate and rhythm. No extremity edema.  Abdomen: No tenderness, soft. Bowel sounds active.  Musculoskeletal: no clubbing / cyanosis. No joint deformity upper and lower extremities.   Skin: no significant rashes, lesions, ulcers. Warm, dry, well-perfused. Neurologic: CN 2-12 grossly intact. Moving all extremities. Alert and fully oriented.  Psychiatric: Calm. Cooperative.    Labs and Imaging on Admission: I have personally reviewed following labs and imaging studies  CBC: Recent Labs  Lab 10/12/24 1011 10/12/24 1654  WBC 3.3* 3.0*  NEUTROABS 2.2 1.8  HGB 11.1* 11.5*  HCT 32.9* 34.2*  MCV 108.2* 107.9*  PLT 86* 88*   Basic Metabolic Panel: Recent Labs  Lab 10/12/24 1011 10/12/24 1654  NA 133* 137  K 3.4* 3.3*  CL 95* 99  CO2 24 27  GLUCOSE 116* 68*  BUN 31* 26*  CREATININE 1.56* 1.26*  CALCIUM  9.0 8.8*   GFR: Estimated Creatinine Clearance: 42.7 mL/min (A) (by C-G formula based on SCr of 1.26 mg/dL (H)). Liver Function Tests: Recent Labs  Lab 10/12/24 1011 10/12/24 1654  AST 32 36  ALT 18 19  ALKPHOS 97 106  BILITOT 0.2 0.3  PROT 6.2* 6.2*  ALBUMIN 3.5 3.6   No results for input(s): LIPASE, AMYLASE in the last 168 hours. Recent Labs  Lab 10/12/24 1654  AMMONIA <13  Coagulation Profile: Recent Labs  Lab 10/12/24 1750  INR 1.3*    Cardiac Enzymes: No results for input(s): CKTOTAL, CKMB, CKMBINDEX, TROPONINI in the last 168 hours. BNP (last 3 results) No results for input(s): PROBNP in the last 8760 hours. HbA1C: No results for input(s): HGBA1C in the last 72 hours. CBG: Recent Labs  Lab 10/12/24 1645  GLUCAP 67*   Lipid Profile: No results for input(s): CHOL, HDL, LDLCALC, TRIG, CHOLHDL, LDLDIRECT in the last 72 hours. Thyroid  Function Tests: No results for input(s): TSH, T4TOTAL, FREET4, T3FREE, THYROIDAB in the last 72 hours. Anemia Panel: No results for input(s): VITAMINB12, FOLATE, FERRITIN, TIBC, IRON, RETICCTPCT in the last 72 hours. Urine analysis:    Component Value Date/Time   COLORURINE YELLOW 10/12/2024 1606   APPEARANCEUR CLOUDY (A) 10/12/2024 1606   LABSPEC 1.025 10/12/2024 1606   PHURINE 5.5 10/12/2024 1606   GLUCOSEU NEGATIVE 10/12/2024 1606   HGBUR TRACE (A) 10/12/2024 1606   BILIRUBINUR NEGATIVE 10/12/2024 1606   BILIRUBINUR neg 03/15/2024 1421   KETONESUR NEGATIVE 10/12/2024 1606   PROTEINUR 100 (A) 10/12/2024 1606   UROBILINOGEN 0.2 03/15/2024 1421   NITRITE NEGATIVE 10/12/2024 1606   LEUKOCYTESUR LARGE (A) 10/12/2024 1606   Sepsis Labs: @LABRCNTIP (procalcitonin:4,lacticidven:4) ) Recent Results (from the past 240 hours)  Resp panel by RT-PCR (RSV, Flu A&B, Covid) Anterior Nasal Swab     Status: None   Collection Time: 10/12/24  4:54 PM   Specimen: Anterior Nasal Swab  Result Value Ref Range Status   SARS Coronavirus 2 by RT PCR NEGATIVE NEGATIVE Final    Comment: (NOTE) SARS-CoV-2 target nucleic acids are NOT DETECTED.  The SARS-CoV-2 RNA is generally detectable in upper respiratory specimens during the acute phase of infection. The lowest concentration of SARS-CoV-2 viral copies this assay can detect is 138 copies/mL. A negative result does not preclude SARS-Cov-2 infection and should not be used as the sole basis for  treatment or other patient management decisions. A negative result may occur with  improper specimen collection/handling, submission of specimen other than nasopharyngeal swab, presence of viral mutation(s) within the areas targeted by this assay, and inadequate number of viral copies(<138 copies/mL). A negative result must be combined with clinical observations, patient history, and epidemiological information. The expected result is Negative.  Fact Sheet for Patients:  bloggercourse.com  Fact Sheet for Healthcare Providers:  seriousbroker.it  This test is no t yet approved or cleared by the [place]  FDA and  has been authorized for detection and/or diagnosis of SARS-CoV-2 by FDA under an Emergency Use Authorization (EUA). This EUA will remain  in effect (meaning this test can be used) for the duration of the COVID-19 declaration under Section 564(b)(1) of the Act, 21 U.S.C.section 360bbb-3(b)(1), unless the authorization is terminated  or revoked sooner.       Influenza A by PCR NEGATIVE NEGATIVE Final   Influenza B by PCR NEGATIVE NEGATIVE Final    Comment: (NOTE) The Xpert Xpress SARS-CoV-2/FLU/RSV plus assay is intended as an aid in the diagnosis of influenza from Nasopharyngeal swab specimens and should not be used as a sole basis for treatment. Nasal washings and aspirates are unacceptable for Xpert Xpress SARS-CoV-2/FLU/RSV testing.  Fact Sheet for Patients: bloggercourse.com  Fact Sheet for Healthcare Providers: seriousbroker.it  This test is not yet approved or cleared by the [place]  FDA and has been authorized for detection and/or diagnosis of SARS-CoV-2 by FDA under an Emergency Use Authorization (EUA). This EUA will remain in effect (meaning this  test can be used) for the duration of the COVID-19 declaration under Section 564(b)(1) of the Act, 21  U.S.C. section 360bbb-3(b)(1), unless the authorization is terminated or revoked.     Resp Syncytial Virus by PCR NEGATIVE NEGATIVE Final    Comment: (NOTE) Fact Sheet for Patients: bloggercourse.com  Fact Sheet for Healthcare Providers: seriousbroker.it  This test is not yet approved or cleared by the [place]  FDA and has been authorized for detection and/or diagnosis of SARS-CoV-2 by FDA under an Emergency Use Authorization (EUA). This EUA will remain in effect (meaning this test can be used) for the duration of the COVID-19 declaration under Section 564(b)(1) of the Act, 21 U.S.C. section 360bbb-3(b)(1), unless the authorization is terminated or revoked.  Performed at [facility], [address] [place], [place] [address]      Radiological Exams on Admission: CT CHEST ABDOMEN PELVIS W CONTRAST Result Date: 10/12/2024 EXAM: CT CHEST, ABDOMEN AND PELVIS WITH CONTRAST 10/12/2024 05:50:57 PM TECHNIQUE: CT of the chest, abdomen and pelvis was performed with the administration of intravenous contrast. Multiplanar reformatted images are provided for review. Automated exposure control, iterative reconstruction, and/or weight based adjustment of the mA/kV was utilized to reduce the radiation dose to as low as reasonably achievable. COMPARISON: Comparison date: 10/03/2024. CLINICAL HISTORY: Cough, n/v/d, hypotension, cancer hx. FINDINGS: CHEST: MEDIASTINUM AND LYMPH NODES: Right chest port catheter tip ends in the SVC. There are atherosclerotic calcifications of the aorta. LUNGS AND PLEURA: New atelectasis/consolidation in the lingula. Post radiation changes are again seen in the anterior inferior left upper lobe. There is a 4 mm left upper lobe nodule on image 7/40 which has increased in size. There is some very faint ground glass opacities diffusely throughout both lungs. There is a new right upper lobe pulmonary nodule image  7/69 measuring 4 mm. ABDOMEN AND PELVIS: LIVER: The liver is unremarkable. GALLBLADDER AND BILE DUCTS: Gallbladder is surgically absent. SPLEEN: No acute abnormality. PANCREAS: No acute abnormality. ADRENAL GLANDS: No acute abnormality. KIDNEYS, URETERS AND BLADDER: Punctate calculi in the left kidney are unchanged. No hydronephrosis. No perinephric or periureteral stranding. Urinary bladder is unremarkable. GI AND BOWEL: There is a very large amount of stool throughout the entire colon. There are some air fluid levels in the proximal colon. There are some relative areas of narrowing throughout the transverse colon which may represent peristalsis, but stricture or mass not excluded. There is no bowel obstruction. Appendix appears normal. There are also scattered air fluid levels throughout distended distal ileal loops. Jejunum and stomach are within normal limits. REPRODUCTIVE ORGANS: No acute abnormality. PERITONEUM AND RETROPERITONEUM: No ascites. No free air. VASCULATURE: Aorta is normal in caliber, but there are atherosclerotic calcifications of the aorta and iliac arteries. ABDOMINAL AND PELVIS LYMPH NODES: No lymphadenopathy. BONES AND SOFT TISSUES: There is wall thickening throughout the esophagus. There is skin thickening of the left breast with diffuse edema throughout the left breast. Surgical clips and calcifications are again noted in the left breast. Medication injection sites are noted in the subcutaneous tissues of the anterior abdominal wall. IMPRESSION: 1. New 4 mm right upper lobe pulmonary nodule and increased size of the 4 mm left upper lobe nodule, concerning for metastatic disease. 2. New atelectasis/consolidation in the lingula. 3. Very faint ground glass opacities diffusely throughout both lungs. Findings may be infectious/inflammatory or related to mild edema. 4. Wall thickening throughout the esophagus. Correlate clinically for esophagitis. 5. Very large amount of stool throughout the  entire colon  with some air-fluid levels in the proximal colon, without bowel obstruction. Findings are concerning for colonic ileus and/or constipation. 6. Similar left breast skin thickening and edema with surgical clips and calcifications. 7. Left renal calculi. Electronically signed by: Greig Pique MD 10/12/2024 06:32 PM EST RP Workstation: [id number]   CT Head Wo Contrast Result Date: 10/12/2024 CLINICAL DATA:  Altered mental status. Weakness and confusion. Active chemotherapy and radiation for cancer. EXAM: CT HEAD WITHOUT CONTRAST TECHNIQUE: Contiguous axial images were obtained from the base of the skull through the vertex without intravenous contrast. RADIATION DOSE REDUCTION: This exam was performed according to the departmental dose-optimization program which includes automated exposure control, adjustment of the mA and/or kV according to patient size and/or use of iterative reconstruction technique. COMPARISON:  Brain MRI 03/02/2024 FINDINGS: Brain: No acute intracranial hemorrhage. No evidence of acute ischemia. No hydrocephalus, the basilar cisterns are patent. The subcentimeter dural-based lesion overlying the left middle frontal gyrus on prior MRI has no CT correlate. No evidence of intracranial mass or edema. No midline shift. No subdural or extra-axial collection. Vascular: No hyperdense vessel or unexpected calcification. Skull: No fracture or focal lesion. Sinuses/Orbits: Trace mucosal thickening involving right side of sphenoid sinus. Occasional mucosal thickening of ethmoid air cells. No mastoid effusion. Bilateral lens resection. Other: None. IMPRESSION: No acute intracranial abnormality. Electronically Signed   By: Andrea Gasman M.D.   On: 10/12/2024 18:26     Assessment/Plan   1. Pneumonia  - Treat with Rocephin and azithromycin , follow cultures and clinical course    2. Acute encephalopathy  - She was confused in the ED, now alert and fully oriented on arrival to [facility]  -  There were no acute findings on head CT, may have been due to Ativan  or waxing/waning delirium  - Use delirium precautions   3. AKI  - SCr is 1.56, up from 0.88 last month  - No hydronephrosis on CT, likely prerenal in setting of recent anorexia and hypotension  - Hold metformin  and metoprolol , continue IVF hydration, renally-dose medications, repeat chem panel in am   4. Hx of left internal jugular thrombus  - Continue Xarelto     5. Insulin -dependent DM  - A1c was 6.6% in August 2025  - Hold metformin  for now, continue insulin    6. Pancytopenia  - Likely due to chemotherapy, appears fairly stable, will repeat CBC in am   7. Breast cancer  - Recurrent left breast cancer currently treated with talazoparib under the care of Dr. Timmy   8. Constipation  - Bowel regimen   9. OSA   - CPAP while sleeping   10. Anxiety  - Continue Seroquel  at bedtime   11. Asthma  - Not in exacerbation, continue albuterol  as-needed    DVT prophylaxis: Xarelto   Code Status: Full  Level of Care: Level of care: Progressive Family Communication: Husband updated from ED   Disposition Plan:  Patient is from: Home  Anticipated d/c is to: Home  Anticipated d/c date is: 12/21 or 10/15/24  Patient currently: Pending improved renal function, tolerance of adequate oral intake, clinical stability  Consults called: None  Admission status: Observation     Evalene GORMAN Sprinkles, MD [facility]  10/12/2024, 9:59 PM       [1]  Allergies Allergen Reactions   Lithium Other (See Comments)    Spinal fluid built up in brain   Dulaglutide Nausea And Vomiting and Other (See Comments)    TRULICITY   Nitrofuran Derivatives Itching  Nitrofurantoin  Mono- MCR   Penicillins Other (See Comments)    UNKNOWN CHILDHOOD REACTION   "

## 2024-10-12 NOTE — ED Triage Notes (Signed)
 Pt sent here today from her Oncologist's office d/t hypotension, weakness and confusion.  Pt currently getting chemo/radiation trx for CA. A&ox2 in triage, speech seems slurred, also present per doc's note from 1030 today. Arrives with accessed port. Pt states she last took her Norco at 1300

## 2024-10-12 NOTE — ED Notes (Signed)
 Per Lab need additional [employer]

## 2024-10-12 NOTE — ED Notes (Signed)
 Husband here at this time telling us  that the Pt. Fell 2 days ago and she was unresponsive but he did not call anyone.  Pt. Had CT of her head and she did not have any traumatic brain injury on the CT scan.  Pt. Is alert and has no abrasions or bruising noted.  Pt. Face is WNL.    EDP has been in to speak with the Pt. Husband and Pt. About admission and reason for admission.

## 2024-10-12 NOTE — ED Notes (Signed)
 This RN attempted to update a family member to provide an update as requested by the patient. No answer at this time and general, HIPAA compliant VM left for callback.

## 2024-10-12 NOTE — Progress Notes (Signed)
" °   10/12/24 2302  BiPAP/CPAP/SIPAP  $ Non-Invasive Home Ventilator  Initial  $ Face Mask Medium Yes  BiPAP/CPAP/SIPAP Pt Type Adult  BiPAP/CPAP/SIPAP Resmed  Mask Type Full face mask  Dentures removed? Yes - Placed in denture cup  Mask Size Medium  FiO2 (%) 21 %  Patient Home Machine No  Patient Home Mask No  Patient Home Tubing No  Auto Titrate Yes  Minimum cmH2O 5 cmH2O  Maximum cmH2O 20 cmH2O  Device Plugged into RED Power Outlet Yes  BiPAP/CPAP /SiPAP Vitals  Pulse Rate 90  Resp 15  SpO2 98 %  Bilateral Breath Sounds Other (Comment) (coarse)  MEWS Score/Color  MEWS Score 0  MEWS Score Color Green    "

## 2024-10-12 NOTE — Progress Notes (Signed)
 Patient to ER via W/C per DR Timmy.

## 2024-10-12 NOTE — Patient Instructions (Signed)
 Dehydration, Adult Dehydration is a condition in which there is not enough water or other fluids in the body. This happens when a person loses more fluids than they take in. Important organs cannot work right without the right amount of fluids. Any loss of fluids from the body can cause dehydration. Dehydration can be mild, worse, or very bad. It should be treated right away to keep it from getting very bad. What are the causes? Conditions that cause loss of water in the body. They include: Watery poop (diarrhea). Vomiting. Sweating a lot. Fever. Infection. Peeing (urinating) a lot. Not drinking enough fluids. Certain medicines, such as medicines that take extra fluid out of the body (diuretics). Lack of safe drinking water. Not being able to get enough water and food. What increases the risk? Having a long-term (chronic) illness that has not been treated the right way, such as: Diabetes. Heart disease. Kidney disease. Being 25 years of age or older. Having a disability. Living in a place that is high above the ground or sea (high in altitude). The thinner, drier air causes more fluid loss. Doing exercises that put stress on your body for a long time. Being active when in hot places. What are the signs or symptoms? Symptoms of dehydration depend on how bad it is. Mild or worse dehydration Thirst. Dry lips or dry mouth. Feeling dizzy or light-headed. Muscle cramps. Passing little pee or dark pee. Pee may be the color of tea. Headache. Very bad dehydration Changes in skin. Skin may: Be cold to the touch (clammy). Be blotchy or pale. Not go back to normal right after you pinch it and let it go. Little or no tears, pee, or sweat. Fast breathing. Low blood pressure. Weak pulse. Pulse that is more than 100 beats a minute when you are sitting still. Other changes, such as: Feeling very thirsty. Eyes that look hollow (sunken). Cold hands and feet. Being confused. Being very  tired (lethargic) or having trouble waking from sleep. Losing weight. Loss of consciousness. How is this treated? Treatment for this condition depends on how bad your dehydration is. Treatment should start right away. Do not wait until your condition gets very bad. Very bad dehydration is an emergency. You will need to go to a hospital. Mild or worse dehydration can be treated at home. You may be asked to: Drink more fluids. Drink an oral rehydration solution (ORS). This drink gives you the right amount of fluids, salts, and minerals (electrolytes). Very bad dehydration can be treated: With fluids through an IV tube. By correcting low levels of electrolytes in the body. By treating the problem that caused your dehydration. Follow these instructions at home: Oral rehydration solution If told by your doctor, drink an ORS: Make an ORS. Use instructions on the package. Start by drinking small amounts, about  cup (120 mL) every 5-10 minutes. Slowly drink more until you have had the amount that your doctor said to have.  Eating and drinking  Drink enough clear fluid to keep your pee pale yellow. If you were told to drink an ORS, finish the ORS first. Then, start slowly drinking other clear fluids. Drink fluids such as: Water. Do not drink only water. Doing that can make the salt (sodium) level in your body get too low. Water from ice chips you suck on. Fruit juice that you have added water to (diluted). Low-calorie sports drinks. Eat foods that have the right amounts of salts and minerals, such as bananas, oranges, potatoes,  tomatoes, or spinach. Do not drink alcohol. Avoid drinks that have caffeine or sugar. These include:: High-calorie sports drinks. Fruit juice that you did not add water to. Soda. Coffee or energy drinks. Avoid foods that are greasy or have a lot of fat or sugar. General instructions Take over-the-counter and prescription medicines only as told by your doctor. Do  not take sodium tablets. Doing that can make the salt level in your body get too high. Return to your normal activities as told by your doctor. Ask your doctor what activities are safe for you. Keep all follow-up visits. Your doctor may check and change your treatment. Contact a doctor if: You have pain in your belly (abdomen) and the pain: Gets worse. Stays in one place. You have a rash. You have a stiff neck. You get angry or annoyed more easily than normal. You are more tired or have a harder time waking than normal. You feel weak or dizzy. You feel very thirsty. Get help right away if: You have any symptoms of very bad dehydration. You vomit every time you eat or drink. Your vomiting gets worse, does not go away, or you vomit blood or green stuff. You are getting treatment, but symptoms are getting worse. You have a fever. You have a very bad headache. You have: Diarrhea that gets worse or does not go away. Blood in your poop (stool). This may cause poop to look black and tarry. No pee in 6-8 hours. Only a small amount of pee in 6-8 hours, and the pee is very dark. You have trouble breathing. These symptoms may be an emergency. Get help right away. Call 911. Do not wait to see if the symptoms will go away. Do not drive yourself to the hospital. This information is not intended to replace advice given to you by your health care provider. Make sure you discuss any questions you have with your health care provider. Document Revised: 05/10/2022 Document Reviewed: 05/10/2022 Elsevier Patient Education  2024 [employer].

## 2024-10-12 NOTE — Progress Notes (Signed)
 " Hematology and Oncology Follow Up Visit  Ariana Herrera [id number] 1966-08-16 58 y.o. 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 daily-start on 12/22/2023 -  changed to 0.50 mg po q day on 03/06/2024, now 3 weeks on/1 week off since 07/20/2024 B12 oral 1,000 mcg once daily    Interim History:  Ariana Herrera is here today for follow-up.  She has had some difficulty recently.  I am not sure if she has some kind of gastroenteritis.  She has not eaten all that well.  She says she is been throwing up.  She does have medications for this.  She has lost some weight.  Her she is dehydrated.  Her blood pressure is quite low..  She has had some dizziness.  I think she has fallen a couple of times.  She has had no blackouts.  There has been no double vision.  She has had no actual headache.  We did do a CT scan on her.  This was done on 10/03/2024.  She has a new 6 mm nodule in the left lung.  She is still smoking quite a bit.  She does have skin thickening of the left breast.  She also notes that she has noticed some subcutaneous nodules over the lower part of the sternum.  Her last CA 27.29 was up to 58.  She is on talazoparib.  I think that she is taking this.  Sometimes it is hard to know if she is or is not taking this.  She has had no swallowing difficulties.  She has had no mouth sores.   There has been no bleeding.  Currently, I would have to say that her performance status is probably ECOG 2.    Medications:  Allergies as of 10/12/2024       Reactions   Lithium Other (See Comments)   Spinal fluid built up in brain   Dulaglutide Nausea And Vomiting, Other (See Comments)   TRULICITY   Nitrofuran Derivatives Itching   Nitrofurantoin  Mono- MCR   Penicillins Other (See Comments)   UNKNOWN CHILDHOOD REACTION        Medication List        Accurate as of October 12, 2024  3:11 PM. If you have any questions, ask your nurse or doctor.          Accu-Chek Guide test strip Generic drug: glucose blood 3 (three) times daily.   albuterol  1.25 MG/3ML nebulizer solution Commonly known as: ACCUNEB  Take 1 ampule by nebulization every 6 (six) hours as needed for wheezing or shortness of breath.   albuterol  108 (90 Base) MCG/ACT inhaler Commonly known as: VENTOLIN  HFA Inhale 2 puffs into the lungs every 6 (six) hours as needed for wheezing or shortness of breath.   Dexcom G7 Sensor Misc Change sensor every 10 days   fenofibrate  145 MG tablet Commonly  known as: TRICOR  Take 145 mg by mouth daily.   gabapentin  400 MG capsule Commonly known as: Neurontin  Take 1 capsule (400 mg total) by mouth 3 (three) times daily. What changed: when to take this   HYDROcodone -acetaminophen  7.5-325 MG tablet Commonly known as: NORCO Take 1 tablet by mouth every 6 (six) hours as needed for moderate pain (pain score 4-6).   lidocaine -prilocaine  cream Commonly known as: EMLA  Apply 1 Application topically daily as needed.   Linzess  290 MCG Caps capsule Generic drug: linaclotide  TAKE 1 CAPSULE BY MOUTH DAILY BEFORE BREAKFAST.   lubiprostone  24 MCG capsule Commonly known as: AMITIZA  Take 1 capsule (24 mcg total) by mouth 2 (two) times daily with a meal.   metFORMIN  1000 MG tablet Commonly known as: GLUCOPHAGE  Take 1 tablet (1,000 mg total) by mouth in the morning and in  the evening with meals.   metoprolol  succinate 25 MG 24 hr tablet Commonly known as: TOPROL -XL Take 12.5 mg by mouth daily.   nitroGLYCERIN  0.4 MG SL tablet Commonly known as: NITROSTAT  Place 1 tablet (0.4 mg total) under the tongue every 5 (five) minutes as needed for chest pain.   NovoLOG  100 UNIT/ML injection Generic drug: insulin  aspart Inject into the skin as directed.   insulin  aspart 100 UNIT/ML injection Commonly known as: novoLOG  Use as directed via insulin  pump. Total daily dose 100 units.   OLANZapine  10 MG tablet Commonly known as: ZYPREXA  TAKE 1 TABLET BY MOUTH EVERYDAY AT BEDTIME   Omnipod 5 DexG7G6 Pods Gen 5 Misc Apply 1 pod every 3 days for insulin  administration.   ondansetron  8 MG disintegrating tablet Commonly known as: ZOFRAN -ODT Take 1 tablet (8 mg total) by mouth every 8 (eight) hours as needed for nausea or vomiting.   oxybutynin  5 MG 24 hr tablet Commonly known as: DITROPAN -XL TAKE 1 TABLET BY MOUTH EVERYDAY AT BEDTIME   pantoprazole  40 MG tablet Commonly known as: PROTONIX  Take 1 tablet (40 mg total) by mouth daily.   PEG 3350  17 g Pack Take 17 g by mouth daily as needed.   prochlorperazine  10 MG tablet Commonly known as: COMPAZINE  Take 1 tablet (10 mg total) by mouth every 6 (six) hours as needed for nausea or vomiting (Zofran  resumes 12/16).   QUEtiapine  400 MG tablet Commonly known as: SEROQUEL  Take 2 tablets (800 mg total) by mouth at bedtime.   rivaroxaban  10 MG Tabs tablet Commonly known as: XARELTO  Take 1 tablet (10 mg total) by mouth daily.   rosuvastatin  40 MG tablet Commonly known as: CRESTOR  Take 1 tablet (40 mg total) by mouth daily.   talazoparib tosylate  0.35 MG capsule Commonly known as: Talzenna  Take 1 capsule (0.35 mg total) by mouth daily. Take for 21 days on, then off for 7 days. Repeat every 28 days.        Allergies:  Allergies  Allergen Reactions   Lithium Other (See Comments)    Spinal fluid built up  in brain   Dulaglutide Nausea And Vomiting and Other (See Comments)    TRULICITY   Nitrofuran Derivatives Itching    Nitrofurantoin  Mono- MCR   Penicillins Other (See Comments)    UNKNOWN CHILDHOOD REACTION    Past Medical History, Surgical history, Social history, and Family History were reviewed and updated.  Review of Systems:  Review of Systems  Constitutional:  Positive for malaise/fatigue and weight loss.  HENT: Negative.    Eyes: Negative.   Respiratory:  Positive for cough.   Cardiovascular:  Positive for  palpitations.  Gastrointestinal:  Positive for diarrhea, nausea and vomiting.  Genitourinary: Negative.   Musculoskeletal:  Positive for back pain, falls and myalgias.  Skin: Negative.   Neurological:  Positive for dizziness and headaches.  Psychiatric/Behavioral:  The patient is nervous/anxious.      Physical Exam:  height is 5' 2 (1.575 m) and weight is 133 lb (60.3 kg). Her oral temperature is 97.6 F (36.4 C). Her blood pressure is 76/44 (abnormal) and her pulse is 56 (abnormal). Her respiration is 20 and oxygen saturation is 96%.   Wt Readings from Last 3 Encounters:  10/12/24 133 lb (60.3 kg)  09/14/24 140 lb 0.6 oz (63.5 kg)  08/24/24 141 lb (64 kg)    Physical Exam Vitals reviewed.  Constitutional:      Comments: Right breast shows no masses, edema or erythema.  There is no right axillary adenopathy.  Her left breast is quite contracted and hyperpigmented from radiation.  It is not tender to palpation.  It is somewhat firm in general.  There is no obvious left axillary adenopathy.  HENT:     Head: Normocephalic and atraumatic.  Eyes:     Pupils: Pupils are equal, round, and reactive to light.  Cardiovascular:     Rate and Rhythm: Normal rate and regular rhythm.     Heart sounds: Normal heart sounds.  Pulmonary:     Effort: Pulmonary effort is normal.     Breath sounds: Normal breath sounds.  Abdominal:     General: Bowel sounds are normal.      Palpations: Abdomen is soft.  Musculoskeletal:        General: No tenderness or deformity. Normal range of motion.     Cervical back: Normal range of motion.     Comments: In the lower portion of the sternum, she does have 3 subcutaneous nodules.  There is slightly hyperpigmented.  They probably measure about 4-5 mm.  They are nontender.  Lymphadenopathy:     Cervical: No cervical adenopathy.  Skin:    General: Skin is warm and dry.     Findings: No erythema or rash.  Neurological:     Mental Status: She is alert and oriented to person, place, and time.  Psychiatric:        Behavior: Behavior normal.        Thought Content: Thought content normal.        Judgment: Judgment normal.      Lab Results  Component Value Date   WBC 3.3 (L) 10/12/2024   HGB 11.1 (L) 10/12/2024   HCT 32.9 (L) 10/12/2024   MCV 108.2 (H) 10/12/2024   PLT 86 (L) 10/12/2024   Lab Results  Component Value Date   FERRITIN 810 (H) 09/14/2024   IRON 87 09/14/2024   TIBC 304 09/14/2024   UIBC 217 09/14/2024   IRONPCTSAT 29 09/14/2024   Lab Results  Component Value Date   RETICCTPCT 3.2 (H) 07/20/2024   RBC 3.04 (L) 10/12/2024   No results found for: KPAFRELGTCHN, LAMBDASER, KAPLAMBRATIO No results found for: IGGSERUM, IGA, IGMSERUM No results found for: TOTALPROTELP, ALBUMINELP, A1GS, A2GS, BETS, BETA2SER, GAMS, MSPIKE, SPEI   Chemistry      Component Value Date/Time   NA 133 (L) 10/12/2024 1011   K 3.4 (L) 10/12/2024 1011   CL 95 (L) 10/12/2024 1011   CO2 24 10/12/2024 1011   BUN 31 (H) 10/12/2024 1011   CREATININE 1.56 (H) 10/12/2024 1011      Component Value Date/Time  CALCIUM  9.0 10/12/2024 1011   ALKPHOS 97 10/12/2024 1011   AST 32 10/12/2024 1011   ALT 18 10/12/2024 1011   BILITOT 0.2 10/12/2024 1011       Impression and Plan: Ms. Birmingham is a very pleasant 58 yo caucasian female with recurrent ductal carcinoma of the left breast.  Recurrence was in  the neck. She was treated with incredibly aggressive chemo radiation therapy.  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   I will bit worried about her current situation.  I gave her some IV fluids in the office.  However, she seems to have more cognitive issues.  She has a lot of lethargy.  She has little bit difficulty talking.  Her speech is somewhat slurred.  Her blood pressure is still on the low side.  I think we will have to get her down to the ER.  I spoke with Dr. Jakie of the ER.  As always, they are very kind for taking our patients.  I am not sure exactly what is going on.  However, I do think she is probably need to have a scan of her brain.  I know that she is at risk for CNS disease.  Hopefully, she will not have to be admitted to the hospital.  She just had her [birth year] on [birth year].  We will see what her CA 27.29 level is.  I will plan to get her back to see us  probably in about 3 or 4 weeks.  I probably will have to try to get her set up for a biopsy of these nodules.  Maybe, we can get her to surgery for this.  Maude JONELLE Crease, MD 12/19/20253:11 PM  "

## 2024-10-12 NOTE — Progress Notes (Signed)
 Hospitalist Transfer Note:    Nursing staff, Please call [facility] Admits & Consults System-Wide number on Amion [phone]) as soon as patient's arrival, so appropriate admitting provider can evaluate the pt.   Transferring facility: [facility] Requesting provider: Dr. Yolande (EDP at [facility]) Reason for transfer: admission for further evaluation and management of acute metabolic encephalopathy in the setting of acute cystitis, community-acquired pneumonia, acute kidney injury, and initial hypotension.    58 year old female with metastatic breast cancer undergoing chemotherapy, type 2 diabetes mellitus, who presented to [facility] ED from oncology clinic for evaluation of new onset confusion as well as hypotension.   At this time, the specific onset/duration of the patient's altered mental status/confusion is unclear, although was noted to be new per oncology clinic personnel when evaluating the patient at her routine follow-up today.  She was also noted to be hypotensive at oncology clinic.  Subsequently sent from oncology clinic to [facility] ED for further evaluation and management thereof.  Patient is also recently been experiencing nausea, vomiting, diarrhea.  Vital signs in the ED were notable for the following: Afebrile; rates in the 90s; initial blood pressure 85/66, which is improved to 103/75 following initiation of IV fluids and broad-spectrum IV antibiotics; respiratory rate 16-17, oxygen saturation 96 to 97% on room air.  Labs were notable for urinalysis was reported to be consistent with urinary tract infection, and presenting CMP was reported to reflect acute kidney injury, and also demonstrated potassium of 3.3.  CBC notable for pancytopenia, which is not new, including leukopenia with white blood cell count of 3000 and relative to most recent prior white blood cell count of 3300 drawn earlier in the day at oncology clinic and relative to will blood cell count of 2300 on 09/14/2024.    Imaging notable for CT head which showed no evidence of acute intracranial process.  CT chest, abdomen, pelvis with contrast was reported to show evidence of pneumonia.   Medications administered prior to transfer included the following: IV vancomycin, cefepime, azithromycin , lactated Ringer's x 2 L bolus.  She also received Zofran  4 mg IV x 1 dose.  Subsequently, I accepted this patient for transfer for observation to a pcu bed at [facility] for further work-up and management of the above. [facility] was chosen for availability of oncology services.      Eva Pore, DO Hospitalist

## 2024-10-13 ENCOUNTER — Observation Stay (HOSPITAL_COMMUNITY)

## 2024-10-13 DIAGNOSIS — Z7901 Long term (current) use of anticoagulants: Secondary | ICD-10-CM | POA: Diagnosis not present

## 2024-10-13 DIAGNOSIS — F319 Bipolar disorder, unspecified: Secondary | ICD-10-CM | POA: Diagnosis present

## 2024-10-13 DIAGNOSIS — I252 Old myocardial infarction: Secondary | ICD-10-CM | POA: Diagnosis not present

## 2024-10-13 DIAGNOSIS — N3000 Acute cystitis without hematuria: Secondary | ICD-10-CM | POA: Diagnosis present

## 2024-10-13 DIAGNOSIS — F17210 Nicotine dependence, cigarettes, uncomplicated: Secondary | ICD-10-CM | POA: Diagnosis present

## 2024-10-13 DIAGNOSIS — N179 Acute kidney failure, unspecified: Secondary | ICD-10-CM

## 2024-10-13 DIAGNOSIS — E860 Dehydration: Secondary | ICD-10-CM | POA: Diagnosis present

## 2024-10-13 DIAGNOSIS — I2510 Atherosclerotic heart disease of native coronary artery without angina pectoris: Secondary | ICD-10-CM | POA: Diagnosis present

## 2024-10-13 DIAGNOSIS — I959 Hypotension, unspecified: Secondary | ICD-10-CM | POA: Diagnosis present

## 2024-10-13 DIAGNOSIS — J159 Unspecified bacterial pneumonia: Secondary | ICD-10-CM | POA: Diagnosis present

## 2024-10-13 DIAGNOSIS — D61810 Antineoplastic chemotherapy induced pancytopenia: Secondary | ICD-10-CM | POA: Diagnosis present

## 2024-10-13 DIAGNOSIS — G4733 Obstructive sleep apnea (adult) (pediatric): Secondary | ICD-10-CM | POA: Diagnosis present

## 2024-10-13 DIAGNOSIS — E1165 Type 2 diabetes mellitus with hyperglycemia: Secondary | ICD-10-CM | POA: Diagnosis present

## 2024-10-13 DIAGNOSIS — K5901 Slow transit constipation: Secondary | ICD-10-CM

## 2024-10-13 DIAGNOSIS — Z1152 Encounter for screening for COVID-19: Secondary | ICD-10-CM | POA: Diagnosis not present

## 2024-10-13 DIAGNOSIS — C50912 Malignant neoplasm of unspecified site of left female breast: Secondary | ICD-10-CM | POA: Diagnosis present

## 2024-10-13 DIAGNOSIS — R296 Repeated falls: Secondary | ICD-10-CM | POA: Diagnosis present

## 2024-10-13 DIAGNOSIS — C50011 Malignant neoplasm of nipple and areola, right female breast: Secondary | ICD-10-CM

## 2024-10-13 DIAGNOSIS — K5900 Constipation, unspecified: Secondary | ICD-10-CM | POA: Diagnosis present

## 2024-10-13 DIAGNOSIS — E669 Obesity, unspecified: Secondary | ICD-10-CM | POA: Diagnosis present

## 2024-10-13 DIAGNOSIS — J4540 Moderate persistent asthma, uncomplicated: Secondary | ICD-10-CM | POA: Diagnosis present

## 2024-10-13 DIAGNOSIS — G9341 Metabolic encephalopathy: Secondary | ICD-10-CM

## 2024-10-13 DIAGNOSIS — J189 Pneumonia, unspecified organism: Secondary | ICD-10-CM

## 2024-10-13 DIAGNOSIS — F419 Anxiety disorder, unspecified: Secondary | ICD-10-CM | POA: Diagnosis present

## 2024-10-13 DIAGNOSIS — Z794 Long term (current) use of insulin: Secondary | ICD-10-CM | POA: Diagnosis not present

## 2024-10-13 DIAGNOSIS — R911 Solitary pulmonary nodule: Secondary | ICD-10-CM | POA: Diagnosis present

## 2024-10-13 DIAGNOSIS — E785 Hyperlipidemia, unspecified: Secondary | ICD-10-CM | POA: Diagnosis present

## 2024-10-13 LAB — GLUCOSE, CAPILLARY
Glucose-Capillary: 103 mg/dL — ABNORMAL HIGH (ref 70–99)
Glucose-Capillary: 110 mg/dL — ABNORMAL HIGH (ref 70–99)
Glucose-Capillary: 143 mg/dL — ABNORMAL HIGH (ref 70–99)
Glucose-Capillary: 216 mg/dL — ABNORMAL HIGH (ref 70–99)
Glucose-Capillary: 276 mg/dL — ABNORMAL HIGH (ref 70–99)
Glucose-Capillary: 288 mg/dL — ABNORMAL HIGH (ref 70–99)
Glucose-Capillary: 48 mg/dL — ABNORMAL LOW (ref 70–99)
Glucose-Capillary: 73 mg/dL (ref 70–99)

## 2024-10-13 LAB — BASIC METABOLIC PANEL WITH GFR
Anion gap: 8 (ref 5–15)
BUN: 17 mg/dL (ref 6–20)
CO2: 26 mmol/L (ref 22–32)
Calcium: 8.3 mg/dL — ABNORMAL LOW (ref 8.9–10.3)
Chloride: 106 mmol/L (ref 98–111)
Creatinine, Ser: 1.08 mg/dL — ABNORMAL HIGH (ref 0.44–1.00)
GFR, Estimated: 60 mL/min — ABNORMAL LOW
Glucose, Bld: 82 mg/dL (ref 70–99)
Potassium: 3.6 mmol/L (ref 3.5–5.1)
Sodium: 141 mmol/L (ref 135–145)

## 2024-10-13 LAB — CBC
HCT: 30 % — ABNORMAL LOW (ref 36.0–46.0)
Hemoglobin: 9.8 g/dL — ABNORMAL LOW (ref 12.0–15.0)
MCH: 36.4 pg — ABNORMAL HIGH (ref 26.0–34.0)
MCHC: 32.7 g/dL (ref 30.0–36.0)
MCV: 111.5 fL — ABNORMAL HIGH (ref 80.0–100.0)
Platelets: 68 K/uL — ABNORMAL LOW (ref 150–400)
RBC: 2.69 MIL/uL — ABNORMAL LOW (ref 3.87–5.11)
RDW: 16.1 % — ABNORMAL HIGH (ref 11.5–15.5)
WBC: 3.1 K/uL — ABNORMAL LOW (ref 4.0–10.5)
nRBC: 0 % (ref 0.0–0.2)

## 2024-10-13 LAB — HIV ANTIBODY (ROUTINE TESTING W REFLEX): HIV Screen 4th Generation wRfx: NONREACTIVE

## 2024-10-13 LAB — CANCER ANTIGEN 27.29: CA 27.29: 58.9 U/mL — ABNORMAL HIGH (ref 0.0–38.6)

## 2024-10-13 MED ORDER — SODIUM CHLORIDE 0.9% FLUSH: 10.0000 mL | INTRAVENOUS | Status: DC | PRN

## 2024-10-13 MED ORDER — IPRATROPIUM-ALBUTEROL 0.5-2.5 (3) MG/3ML IN SOLN
3.0000 mL | Freq: Three times a day (TID) | RESPIRATORY_TRACT | Status: DC
  Administered 2024-10-13 – 2024-10-15 (×6): 3 mL via RESPIRATORY_TRACT
  Filled 2024-10-13 (×6): qty 3

## 2024-10-13 MED ORDER — LACTATED RINGERS IV BOLUS
1000.0000 mL | Freq: Once | INTRAVENOUS | Status: AC
  Administered 2024-10-13: 1000 mL via INTRAVENOUS

## 2024-10-13 MED ORDER — GADOBUTROL 1 MMOL/ML IV SOLN
6.0000 mL | Freq: Once | INTRAVENOUS | Status: AC | PRN
  Administered 2024-10-13: 6 mL via INTRAVENOUS

## 2024-10-13 MED ORDER — SODIUM CHLORIDE 0.9% FLUSH
10.0000 mL | Freq: Two times a day (BID) | INTRAVENOUS | Status: DC
  Administered 2024-10-13 – 2024-10-15 (×3): 10 mL

## 2024-10-13 MED ORDER — INSULIN ASPART 100 UNIT/ML IJ SOLN
0.0000 [IU] | Freq: Every day | INTRAMUSCULAR | Status: DC
  Administered 2024-10-13: 3 [IU] via SUBCUTANEOUS
  Administered 2024-10-14: 4 [IU] via SUBCUTANEOUS
  Filled 2024-10-13: qty 3
  Filled 2024-10-13: qty 4

## 2024-10-13 MED ORDER — BISACODYL 10 MG RE SUPP
10.0000 mg | Freq: Once | RECTAL | Status: DC
  Filled 2024-10-13: qty 1

## 2024-10-13 MED ORDER — SODIUM CHLORIDE 0.9 % IV BOLUS: 1000.0000 mL | Freq: Once | INTRAVENOUS | Status: DC

## 2024-10-13 MED ORDER — FLEET ENEMA RE ENEM
1.0000 | ENEMA | Freq: Once | RECTAL | Status: AC
  Administered 2024-10-13: 1 via RECTAL
  Filled 2024-10-13: qty 1

## 2024-10-13 MED ORDER — POLYETHYLENE GLYCOL 3350 17 G PO PACK
17.0000 g | PACK | Freq: Two times a day (BID) | ORAL | Status: DC
  Administered 2024-10-13 – 2024-10-15 (×5): 17 g via ORAL
  Filled 2024-10-13 (×5): qty 1

## 2024-10-13 MED ORDER — INSULIN ASPART 100 UNIT/ML IJ SOLN
0.0000 [IU] | Freq: Three times a day (TID) | INTRAMUSCULAR | Status: DC
  Administered 2024-10-13 – 2024-10-15 (×5): 5 [IU] via SUBCUTANEOUS
  Administered 2024-10-15: 9 [IU] via SUBCUTANEOUS
  Filled 2024-10-13: qty 9
  Filled 2024-10-13 (×5): qty 5

## 2024-10-13 MED ORDER — METHYLPREDNISOLONE SODIUM SUCC 125 MG IJ SOLR
81.2500 mg | Freq: Two times a day (BID) | INTRAMUSCULAR | Status: DC
  Administered 2024-10-13 – 2024-10-14 (×4): 81.25 mg via INTRAVENOUS
  Filled 2024-10-13 (×4): qty 2

## 2024-10-13 MED ORDER — LACTATED RINGERS IV SOLN: INTRAVENOUS | Status: AC

## 2024-10-13 MED ORDER — CHLORHEXIDINE GLUCONATE CLOTH 2 % EX PADS
6.0000 | MEDICATED_PAD | Freq: Every day | CUTANEOUS | Status: DC
  Administered 2024-10-13 – 2024-10-14 (×2): 6 via TOPICAL

## 2024-10-13 NOTE — Progress Notes (Signed)
 Patient's blood sugar 48 upon checking. Patient was given 8oz of apple juice and provider Andrez NP notified. Patient has an insulin  pump on, and patient is unable to explain the settings. Insulin  pump removed per provider and placed with patient belongings. Repeat blood sugar checks read 73 and 110.

## 2024-10-13 NOTE — Consult Note (Signed)
 Ariana Herrera is well-known to me.  She is a very nice 58 year old postmenopausal white female.  She has history of recurrent breast cancer.  She had recurrence in the neck.  She was treated with radiation chemotherapy.  She currently is on talazoparib.  She had a CT scan that was done a couple weeks ago or so.  This showed a new nodule in the left lung.  She is in the office yesterday.  She was very dehydrated.  She says she had a some gastroenteritis.  She had nausea and vomiting.  She was not eating.  She did not say she was having a diarrhea.  You got IV fluids in the office.  However, she was still hypotensive.  She has similar changes in her cognitive state.  As such, she subsequently was sent to the emergency room.  She was then admitted.  She did have a CT scan that was done.  This did not show anything obvious in the brain.  She had a CT of the body.  She had the left upper lobe nodule.  Also was noted to be a right upper lobe nodule.  Of note, she smokes quite a bit.  Her tumor marker for breast cancer-CA 27.29-has been rising slowly.  She I think has lost maybe little weight.  She has had no obvious bleeding.  She seems a little better this morning.  She sounds incredibly congested.  She is on antibiotics.  I think she is getting nebulizers.  I do not think it would be about to get her on some steroids.  Her labs today show sodium 141.  Potassium 3.6.  BUN 17 creatinine 1.08.  Calcium  8.3 with an albumin of 3.6.  Her white cell count 3.1.  Hemoglobin 9.8.  Platelet count 68,000.  When I saw her in the office, I told her to hold the talazoparib for a week or so.  She is not complain of any obvious pain.  Currently, I will say that her performance status is probably ECOG 2.   Her vital signs showed temperature of 98.5.  Pulse 96.  Blood pressure 102/64.  Oxygen saturation on room air is 94%.  Her head and neck exam shows no ocular or oral lesions.  She has no obvious mucositis.   There is no obvious adenopathy in the neck.  Pulmonary exam shows wheezes bilaterally.  She has a lot of congestion.  She has wheezes and rhonchi.  Air movement is decent.  Cardiac exam regular rate and rhythm.  Abdomen is soft.  Bowel sounds are present.  There is no guarding or rebound tenderness.  Extremity shows no clubbing, cyanosis or edema.  Neurological exam shows no focal neurological deficits.  Skin exam shows the left breast to have a lot of hyperpigmentation and is quite firm from past radiation.  She does have some subcutaneous nodules that are may be 4-5 mm in the lower part of the sternum.   I do worry that Ariana Herrera does have progressive breast cancer.  Again, I know she smokes quite a bit.  However I think that the nodules in the lung could certainly be from breast cancer.  On her exam, she has these small nodules on the sternum.  This is the lower part of the sternum.  I think 1 of these really needs to be biopsied.  I do not know if she could have this while she is in the hospital.  I do not know if we can get  one of the surgeons to do this for us .  Her lungs still sound very congested.  Again I know the smoking does not help.  I do still think that she needs an MRI of the brain.  I know the CT scan was unremarkable.  However, she could still have small areas metastasis in the brain that could be causing this dizziness and falling.  I really do appreciate everybody's help.  Ariana Herrera is a very nice.  She has tried very hard.  She has done everything we have asked her to do.  We will follow along.   Jeralyn Crease, MD  Herlene 1:45

## 2024-10-13 NOTE — Plan of Care (Signed)

## 2024-10-13 NOTE — Progress Notes (Signed)
 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  Also with lightheadedness and multiple falls.  Reportedly had some loose stool before arrival.  At the cancer center her blood pressure was 76/44, she was given 500 mL IV fluids, Ativan  and sent to the ER. In the emergency room afebrile.  On room air.  Blood pressure 85/66.  Creatinine 1.56, WC count 3.3.  Lactic acid was normal.  RVP panel negative.  Head CT was normal.  CT chest abdomen pelvis with a right upper lobe nodule increased in size left upper lobe nodule, new atelectasis or pneumonia in the lingula, faint ground glass opacities in bilateral lungs, large amount of stool throughout the colon.  Given IV fluids, treated with antibiotics for presumed pneumonia and admitted to the hospital. Foley catheter was placed due to retaining urine.  Subjective: Patient seen and examined.  Her husband and son both at the bedside. Patient is alert awake and oriented.  She denies any confusion.  Family was not sure she was disoriented yesterday but they know that she gets dizzy when she is standing up and had fallen because of lightheadedness. Patient still has some cough and sputum production. Whole family had rhinovirus during Thanksgiving time, everybody else has recovered but patient has not recovered from that yet. Nauseated.  Not eating well at home.  Constipated all the time.  Last bowel movement was more than 7 days ago.  She only takes Colace at home.   Assessment & Plan:   Left lower lobe pneumonia: Agree with admission given severity of symptoms. Antibiotics to treat bacterial pneumonia, continue Rocephin   and azithromycin  today. Chest physiotherapy, incentive spirometry, deep breathing exercises, sputum induction, mucolytic's and bronchodilators. Sputum cultures, blood cultures, Legionella and streptococcal antigen. Supplemental oxygen to keep saturations more than 90%. Mobilize with PT OT.  AKI, urinary retention: Treated with IV fluids.  Normalized.  Continue maintenance fluid.  Urinary retention likely secondary to constipation.  Continue Foley catheter today until mobility improves and constipation improves.  Hypovolemic hypotension: Treated with IV fluids.  Blood pressure is stabilizing.  Continue maintenance fluid today. Patient is off all the blood pressure medications now.  Check orthostatic symptoms.  Breast cancer, recurrent with metastatic colon patient on Talazoparib that is now on hold now.  Oncology following.  Acute metabolic encephalopathy: Likely due to #1.  MRI of the brain without evidence of acute findings or tumors. Mental status improved.  Type 2 diabetes, on insulin  pump with hypoglycemia: Pump removed.  Will keep on low-dose insulin  until able to have oral intake.  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  Sleep apnea: On CPAP.  Anxiety: On Seroquel   Smoker: Counseled to quit.  Constipation: MiraLAX  twice daily, Senokot twice daily, 1 dose of Fleet enema today.    DVT prophylaxis: rivaroxaban  (XARELTO ) tablet 10 mg Start: 10/13/24 1000 rivaroxaban  (XARELTO ) tablet 10 mg   Code Status: Full code Family Communication: Husband at the bedside Disposition Plan: Status is: Observation The patient will require care spanning > 2 midnights and should be moved to inpatient because: Progressive symptoms, unable to eat, IV antibiotics     Consultants:  Oncology  Procedures:  None  Antimicrobials:  Rocephin  azithromycin  12/19----     Objective: Vitals:   10/13/24 0213 10/13/24 0601 10/13/24 1129 10/13/24 1425  BP: 93/64 102/64  93/64  Pulse: 96 96   (!) 102  Resp: 19 20    Temp: 97.7 F (36.5 C) 98.5 F (36.9 C)  98.9 F (37.2 C)  TempSrc: Oral Oral  Oral  SpO2: 93% 94% 94% 96%  Weight:      Height:        Intake/Output Summary (Last 24 hours) at 10/13/2024 1445 Last data filed at 10/13/2024 1300 Gross per 24 hour  Intake 2678.86 ml  Output 4250 ml  Net -1571.14 ml   Filed Weights   10/12/24 2338  Weight: 60.3 kg    Examination:  General exam: Appears calm and comfortable.  Able to talk in complete sentences.  Frail. Respiratory system: Bilateral expiratory wheezes all over the lung fields, plenty of conducted upper airway sounds.  Not in any distress. Cardiovascular system: S1 & S2 heard, RRR.  No pedal edema.  Port-A-Cath present right chest wall. Gastrointestinal system: Abdomen is nondistended, soft and nontender. No organomegaly or masses felt. Normal bowel sounds heard. Foley catheter with clear urine. Central nervous system: Alert and oriented. No focal neurological deficits.  Grossly weak.    Data Reviewed: I have personally reviewed following labs and imaging studies  CBC: Recent Labs  Lab 10/12/24 1011 10/12/24 1654 10/13/24 0037  WBC 3.3* 3.0* 3.1*  NEUTROABS 2.2 1.8  --   HGB 11.1* 11.5* 9.8*  HCT 32.9* 34.2* 30.0*  MCV 108.2* 107.9* 111.5*  PLT 86* 88* 68*   Basic Metabolic Panel: Recent Labs  Lab 10/12/24 1011 10/12/24 1654 10/13/24 0037  NA 133* 137 141  K 3.4* 3.3* 3.6  CL 95* 99 106  CO2 24 27 26   GLUCOSE 116* 68* 82  BUN 31* 26* 17  CREATININE 1.56* 1.26* 1.08*  CALCIUM  9.0 8.8* 8.3*   GFR: Estimated Creatinine Clearance: 49.8 mL/min (A) (by C-G formula based on SCr of 1.08 mg/dL (H)). Liver Function Tests: Recent Labs  Lab 10/12/24 1011 10/12/24 1654  AST 32 36  ALT 18 19  ALKPHOS 97 106  BILITOT 0.2 0.3  PROT 6.2* 6.2*  ALBUMIN 3.5 3.6   No results for input(s): LIPASE, AMYLASE in the last 168 hours. Recent Labs  Lab 10/12/24 1654  AMMONIA <13    Coagulation Profile: Recent Labs  Lab 10/12/24 1750  INR 1.3*   Cardiac Enzymes: No results for input(s): CKTOTAL, CKMB, CKMBINDEX, TROPONINI in the last 168 hours. BNP (last 3 results) No results for input(s): PROBNP in the last 8760 hours. HbA1C: No results for input(s): HGBA1C in the last 72 hours. CBG: Recent Labs  Lab 10/13/24 0057 10/13/24 0130 10/13/24 0406 10/13/24 0752 10/13/24 1202  GLUCAP 73 110* 103* 143* 216*   Lipid Profile: No results for input(s): CHOL, HDL, LDLCALC, TRIG, CHOLHDL, LDLDIRECT in the last 72 hours. Thyroid  Function Tests: No results for input(s): TSH, T4TOTAL, FREET4, T3FREE, THYROIDAB in the last 72 hours. Anemia Panel: No results for input(s): VITAMINB12, FOLATE, FERRITIN, TIBC, IRON, RETICCTPCT in the last 72 hours. Sepsis Labs: Recent Labs  Lab 10/12/24 1603  LATICACIDVEN 1.4    Recent Results (from the past 240 hours)  Culture, blood (Routine x 2)     Status: None (Preliminary result)   Collection Time: 10/12/24  4:03 PM   Specimen: BLOOD  Result Value Ref Range Status   Specimen Description   Final  BLOOD LEFT ANTECUBITAL Performed at [facility], [address] [place], [place] [address]    Special Requests   Final    BOTTLES DRAWN AEROBIC AND ANAEROBIC Blood Culture adequate volume Performed at [facility], [address] [place], [place] [address]    Culture   Final    NO GROWTH < 12 HOURS Performed at [facility], [address], [place], [place] [address]    Report Status PENDING  Incomplete  Culture, blood (Routine x 2)     Status: None (Preliminary result)   Collection Time: 10/12/24  4:08 PM   Specimen: BLOOD  Result Value Ref Range Status   Specimen Description   Final    BLOOD RIGHT ANTECUBITAL Performed at [facility], [address] [place], [place] [address]    Special Requests   Final    BOTTLES DRAWN AEROBIC  AND ANAEROBIC Blood Culture adequate volume Performed at [facility], [address] [place], [place] [address]    Culture   Final    NO GROWTH < 12 HOURS Performed at [facility], [address], [place], [place] [address]    Report Status PENDING  Incomplete  Resp panel by RT-PCR (RSV, Flu A&B, Covid) Anterior Nasal Swab     Status: None   Collection Time: 10/12/24  4:54 PM   Specimen: Anterior Nasal Swab  Result Value Ref Range Status   SARS Coronavirus 2 by RT PCR NEGATIVE NEGATIVE Final    Comment: (NOTE) SARS-CoV-2 target nucleic acids are NOT DETECTED.  The SARS-CoV-2 RNA is generally detectable in upper respiratory specimens during the acute phase of infection. The lowest concentration of SARS-CoV-2 viral copies this assay can detect is 138 copies/mL. A negative result does not preclude SARS-Cov-2 infection and should not be used as the sole basis for treatment or other patient management decisions. A negative result may occur with  improper specimen collection/handling, submission of specimen other than nasopharyngeal swab, presence of viral mutation(s) within the areas targeted by this assay, and inadequate number of viral copies(<138 copies/mL). A negative result must be combined with clinical observations, patient history, and epidemiological information. The expected result is Negative.  Fact Sheet for Patients:  bloggercourse.com  Fact Sheet for Healthcare Providers:  seriousbroker.it  This test is no t yet approved or cleared by the [place]  FDA and  has been authorized for detection and/or diagnosis of SARS-CoV-2 by FDA under an Emergency Use Authorization (EUA). This EUA will remain  in effect (meaning this test can be used) for the duration of the COVID-19 declaration under Section 564(b)(1) of the Act, 21 U.S.C.section 360bbb-3(b)(1), unless the authorization is terminated  or revoked  sooner.       Influenza A by PCR NEGATIVE NEGATIVE Final   Influenza B by PCR NEGATIVE NEGATIVE Final    Comment: (NOTE) The Xpert Xpress SARS-CoV-2/FLU/RSV plus assay is intended as an aid in the diagnosis of influenza from Nasopharyngeal swab specimens and should not be used as a sole basis for treatment. Nasal washings and aspirates are unacceptable for Xpert Xpress SARS-CoV-2/FLU/RSV testing.  Fact Sheet for Patients: bloggercourse.com  Fact Sheet for Healthcare Providers: seriousbroker.it  This test is not yet approved or cleared by the [place]  FDA and has been authorized for detection and/or diagnosis of SARS-CoV-2 by FDA under an Emergency Use Authorization (EUA). This EUA will remain in effect (meaning this test can be  used) for the duration of the COVID-19 declaration under Section 564(b)(1) of the Act, 21 U.S.C. section 360bbb-3(b)(1), unless the authorization is terminated or revoked.     Resp Syncytial Virus by PCR NEGATIVE NEGATIVE Final    Comment: (NOTE) Fact Sheet for Patients: bloggercourse.com  Fact Sheet for Healthcare Providers: seriousbroker.it  This test is not yet approved or cleared by the [place]  FDA and has been authorized for detection and/or diagnosis of SARS-CoV-2 by FDA under an Emergency Use Authorization (EUA). This EUA will remain in effect (meaning this test can be used) for the duration of the COVID-19 declaration under Section 564(b)(1) of the Act, 21 U.S.C. section 360bbb-3(b)(1), unless the authorization is terminated or revoked.  Performed at [facility], [address], [place], [place] [address]          Radiology Studies: MR BRAIN W WO CONTRAST Result Date: 10/13/2024 EXAM: MRI BRAIN WITH AND WITHOUT CONTRAST 10/13/2024 09:48:11 AM TECHNIQUE: Multiplanar multisequence MRI of the head/brain was  performed with and without the administration of intravenous contrast. 6 mL (gadobutrol  (GADAVIST ) 1 MMOL/ML injection 6 mL GADOBUTROL  1 MMOL/ML IV SOLN) was administered. COMPARISON: MRI of the head dated 03/02/2024. CLINICAL HISTORY: Breast cancer, invasive, stage IV, initial workup; She has been having some dizziness. She has had some episodes of falling. FINDINGS: BRAIN AND VENTRICLES: No acute infarct. No acute intracranial hemorrhage. No mass effect or midline shift. No hydrocephalus. A focal area of dural thickening is again seen, measuring 7 mm in diameter, which remains compatible with a meningioma. There are no findings suspicious for metastatic disease. There is no significant change from the previous study. The sella is unremarkable. Normal flow voids. ORBITS: The patient is status post bilateral lens replacement. No acute abnormality. SINUSES: Mild mucosal disease is present within the left maxillary sinus. No acute abnormality. BONES AND SOFT TISSUES: Normal bone marrow signal and enhancement. No acute soft tissue abnormality. IMPRESSION: 1. No acute intracranial abnormality. 2. Stable 7 mm meningioma without significant interval change. 3. No findings suspicious for metastatic disease. 4. Mild left maxillary sinus mucosal disease. Electronically signed by: Evalene Coho MD 10/13/2024 10:48 AM EST RP Workstation: [id number]   CT CHEST ABDOMEN PELVIS W CONTRAST Result Date: 10/12/2024 EXAM: CT CHEST, ABDOMEN AND PELVIS WITH CONTRAST 10/12/2024 05:50:57 PM TECHNIQUE: CT of the chest, abdomen and pelvis was performed with the administration of intravenous contrast. Multiplanar reformatted images are provided for review. Automated exposure control, iterative reconstruction, and/or weight based adjustment of the mA/kV was utilized to reduce the radiation dose to as low as reasonably achievable. COMPARISON: Comparison date: 10/03/2024. CLINICAL HISTORY: Cough, n/v/d, hypotension, cancer hx. FINDINGS:  CHEST: MEDIASTINUM AND LYMPH NODES: Right chest port catheter tip ends in the SVC. There are atherosclerotic calcifications of the aorta. LUNGS AND PLEURA: New atelectasis/consolidation in the lingula. Post radiation changes are again seen in the anterior inferior left upper lobe. There is a 4 mm left upper lobe nodule on image 7/40 which has increased in size. There is some very faint ground glass opacities diffusely throughout both lungs. There is a new right upper lobe pulmonary nodule image 7/69 measuring 4 mm. ABDOMEN AND PELVIS: LIVER: The liver is unremarkable. GALLBLADDER AND BILE DUCTS: Gallbladder is surgically absent. SPLEEN: No acute abnormality. PANCREAS: No acute abnormality. ADRENAL GLANDS: No acute abnormality. KIDNEYS, URETERS AND BLADDER: Punctate calculi in the left kidney are unchanged. No hydronephrosis. No perinephric or periureteral stranding. Urinary bladder is unremarkable. GI AND BOWEL: There is  a very large amount of stool throughout the entire colon. There are some air fluid levels in the proximal colon. There are some relative areas of narrowing throughout the transverse colon which may represent peristalsis, but stricture or mass not excluded. There is no bowel obstruction. Appendix appears normal. There are also scattered air fluid levels throughout distended distal ileal loops. Jejunum and stomach are within normal limits. REPRODUCTIVE ORGANS: No acute abnormality. PERITONEUM AND RETROPERITONEUM: No ascites. No free air. VASCULATURE: Aorta is normal in caliber, but there are atherosclerotic calcifications of the aorta and iliac arteries. ABDOMINAL AND PELVIS LYMPH NODES: No lymphadenopathy. BONES AND SOFT TISSUES: There is wall thickening throughout the esophagus. There is skin thickening of the left breast with diffuse edema throughout the left breast. Surgical clips and calcifications are again noted in the left breast. Medication injection sites are noted in the subcutaneous  tissues of the anterior abdominal wall. IMPRESSION: 1. New 4 mm right upper lobe pulmonary nodule and increased size of the 4 mm left upper lobe nodule, concerning for metastatic disease. 2. New atelectasis/consolidation in the lingula. 3. Very faint ground glass opacities diffusely throughout both lungs. Findings may be infectious/inflammatory or related to mild edema. 4. Wall thickening throughout the esophagus. Correlate clinically for esophagitis. 5. Very large amount of stool throughout the entire colon with some air-fluid levels in the proximal colon, without bowel obstruction. Findings are concerning for colonic ileus and/or constipation. 6. Similar left breast skin thickening and edema with surgical clips and calcifications. 7. Left renal calculi. Electronically signed by: Greig Pique MD 10/12/2024 06:32 PM EST RP Workstation: [id number]   CT Head Wo Contrast Result Date: 10/12/2024 CLINICAL DATA:  Altered mental status. Weakness and confusion. Active chemotherapy and radiation for cancer. EXAM: CT HEAD WITHOUT CONTRAST TECHNIQUE: Contiguous axial images were obtained from the base of the skull through the vertex without intravenous contrast. RADIATION DOSE REDUCTION: This exam was performed according to the departmental dose-optimization program which includes automated exposure control, adjustment of the mA and/or kV according to patient size and/or use of iterative reconstruction technique. COMPARISON:  Brain MRI 03/02/2024 FINDINGS: Brain: No acute intracranial hemorrhage. No evidence of acute ischemia. No hydrocephalus, the basilar cisterns are patent. The subcentimeter dural-based lesion overlying the left middle frontal gyrus on prior MRI has no CT correlate. No evidence of intracranial mass or edema. No midline shift. No subdural or extra-axial collection. Vascular: No hyperdense vessel or unexpected calcification. Skull: No fracture or focal lesion. Sinuses/Orbits: Trace mucosal thickening  involving right side of sphenoid sinus. Occasional mucosal thickening of ethmoid air cells. No mastoid effusion. Bilateral lens resection. Other: None. IMPRESSION: No acute intracranial abnormality. Electronically Signed   By: Andrea Gasman M.D.   On: 10/12/2024 18:26        Scheduled Meds:  azithromycin   500 mg Oral Daily   bisacodyl   10 mg Rectal Once   Chlorhexidine  Gluconate Cloth  6 each Topical Daily   gabapentin   400 mg Oral BID   insulin  aspart  0-5 Units Subcutaneous QHS   insulin  aspart  0-9 Units Subcutaneous TID WC   ipratropium-albuterol   3 mL Nebulization TID   methylPREDNISolone  (SOLU-MEDROL ) injection  81.25 mg Intravenous Q12H   pantoprazole   40 mg Oral Daily   polyethylene glycol  17 g Oral BID   QUEtiapine   800 mg Oral QHS   rivaroxaban   10 mg Oral Daily   rosuvastatin   40 mg Oral Daily   senna  1 tablet Oral BID  sodium chloride  flush  10-40 mL Intracatheter Q12H   sodium chloride  flush  10-40 mL Intracatheter Q12H   sodium chloride  flush  3 mL Intravenous Q12H   sodium phosphate   1 enema Rectal Once   Continuous Infusions:  cefTRIAXone  (ROCEPHIN )  IV 2 g (10/13/24 0418)   lactated ringers  150 mL/hr at 10/13/24 1440     LOS: 0 days    Time spent: 55 minutes    Renato Applebaum, MD [facility]   "

## 2024-10-13 NOTE — Plan of Care (Signed)
  Problem: Clinical Measurements: Goal: Respiratory complications will improve Outcome: Progressing   Problem: Nutrition: Goal: Adequate nutrition will be maintained Outcome: Progressing   Problem: Pain Managment: Goal: General experience of comfort will improve and/or be controlled Outcome: Progressing

## 2024-10-13 NOTE — Progress Notes (Signed)
" °   10/13/24 2206  BiPAP/CPAP/SIPAP  BiPAP/CPAP/SIPAP Pt Type Adult  BiPAP/CPAP/SIPAP Resmed  Mask Type Full face mask  Mask Size Medium  FiO2 (%) 21 %  Patient Home Machine No  Patient Home Mask No  Patient Home Tubing No  Auto Titrate Yes  Minimum cmH2O 5 cmH2O  Maximum cmH2O 20 cmH2O  BiPAP/CPAP /SiPAP Vitals  Resp (!) 29  MEWS Score/Color  MEWS Score 4  MEWS Score Color Red    "

## 2024-10-14 DIAGNOSIS — N179 Acute kidney failure, unspecified: Secondary | ICD-10-CM | POA: Diagnosis not present

## 2024-10-14 DIAGNOSIS — K5901 Slow transit constipation: Secondary | ICD-10-CM | POA: Diagnosis not present

## 2024-10-14 DIAGNOSIS — J189 Pneumonia, unspecified organism: Secondary | ICD-10-CM | POA: Diagnosis not present

## 2024-10-14 DIAGNOSIS — G9341 Metabolic encephalopathy: Secondary | ICD-10-CM | POA: Diagnosis not present

## 2024-10-14 LAB — BASIC METABOLIC PANEL WITH GFR
Anion gap: 7 (ref 5–15)
BUN: 12 mg/dL (ref 6–20)
CO2: 24 mmol/L (ref 22–32)
Calcium: 8.5 mg/dL — ABNORMAL LOW (ref 8.9–10.3)
Chloride: 107 mmol/L (ref 98–111)
Creatinine, Ser: 0.94 mg/dL (ref 0.44–1.00)
GFR, Estimated: >60 mL/min
Glucose, Bld: 264 mg/dL — ABNORMAL HIGH (ref 70–99)
Potassium: 4 mmol/L (ref 3.5–5.1)
Sodium: 139 mmol/L (ref 135–145)

## 2024-10-14 LAB — CBC WITH DIFFERENTIAL/PLATELET
Abs Immature Granulocytes: 0.03 K/uL (ref 0.00–0.07)
Basophils Absolute: 0 K/uL (ref 0.0–0.1)
Basophils Relative: 0 %
Eosinophils Absolute: 0 K/uL (ref 0.0–0.5)
Eosinophils Relative: 0 %
HCT: 26.1 % — ABNORMAL LOW (ref 36.0–46.0)
Hemoglobin: 8.7 g/dL — ABNORMAL LOW (ref 12.0–15.0)
Immature Granulocytes: 1 %
Lymphocytes Relative: 7 %
Lymphs Abs: 0.3 K/uL — ABNORMAL LOW (ref 0.7–4.0)
MCH: 36.4 pg — ABNORMAL HIGH (ref 26.0–34.0)
MCHC: 33.3 g/dL (ref 30.0–36.0)
MCV: 109.2 fL — ABNORMAL HIGH (ref 80.0–100.0)
Monocytes Absolute: 0.1 K/uL (ref 0.1–1.0)
Monocytes Relative: 2 %
Neutro Abs: 4.1 K/uL (ref 1.7–7.7)
Neutrophils Relative %: 90 %
Platelets: 70 K/uL — ABNORMAL LOW (ref 150–400)
RBC: 2.39 MIL/uL — ABNORMAL LOW (ref 3.87–5.11)
RDW: 15.6 % — ABNORMAL HIGH (ref 11.5–15.5)
WBC: 4.6 K/uL (ref 4.0–10.5)
nRBC: 0 % (ref 0.0–0.2)

## 2024-10-14 LAB — HEPATIC FUNCTION PANEL
ALT: 16 U/L (ref 0–44)
AST: 17 U/L (ref 15–41)
Albumin: 2.8 g/dL — ABNORMAL LOW (ref 3.5–5.0)
Alkaline Phosphatase: 84 U/L (ref 38–126)
Bilirubin, Direct: 0.1 mg/dL (ref 0.0–0.2)
Total Bilirubin: <0.2 mg/dL (ref 0.0–1.2)
Total Protein: 4.8 g/dL — ABNORMAL LOW (ref 6.5–8.1)

## 2024-10-14 LAB — GLUCOSE, CAPILLARY
Glucose-Capillary: 255 mg/dL — ABNORMAL HIGH (ref 70–99)
Glucose-Capillary: 260 mg/dL — ABNORMAL HIGH (ref 70–99)
Glucose-Capillary: 262 mg/dL — ABNORMAL HIGH (ref 70–99)
Glucose-Capillary: 283 mg/dL — ABNORMAL HIGH (ref 70–99)
Glucose-Capillary: 288 mg/dL — ABNORMAL HIGH (ref 70–99)
Glucose-Capillary: 296 mg/dL — ABNORMAL HIGH (ref 70–99)
Glucose-Capillary: 321 mg/dL — ABNORMAL HIGH (ref 70–99)

## 2024-10-14 MED ORDER — MIDODRINE HCL 5 MG PO TABS
5.0000 mg | ORAL_TABLET | Freq: Three times a day (TID) | ORAL | Status: DC
  Administered 2024-10-14 – 2024-10-15 (×3): 5 mg via ORAL
  Filled 2024-10-14 (×3): qty 1

## 2024-10-14 MED ORDER — ORAL CARE MOUTH RINSE: 15.0000 mL | OROMUCOSAL | Status: DC | PRN

## 2024-10-14 NOTE — Evaluation (Signed)
 Physical Therapy Evaluation Patient Details Name: Ariana Herrera MRN: [id number] DOB: 1966-10-09 Today's Date: 10/14/2024  History of Present Illness  58 yo female presents to therapy from cancer center due to hypotension and directed to hospital 10/22/2024 due to poor PO intake, fatigue and cough x 3 wks. Pt also endorses light headedness and multiple recent falls. Pt found to have PNA, acute metabolic encephalopathy,  and urinary retention. 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  Clinical Impression      Pt admitted with above diagnosis.  Pt currently with functional limitations due to the deficits listed below (see PT Problem List). Pt in bed when PT and OT arrived, nursing had initiated orthostatic assessment, please see below for BP findings. Pt agreeable to therapy intervention, CGA for sit to stand  from EOB pt becoming dizzy and requested to return to sitting EOB ~25s standing, pt seated EOB and exhibited sudden and painful onset of abdominal cramping- R LQ and reports it occasionally happens s/p PO intake. Cramping resolved and pt required min A to return to EOB, CGA and RW for safety for SPT to recliner. Pt left in recliner, all needs in place. Pt is requesting to resume OPPT services at time of hospital d/c. Pt will benefit from acute skilled PT to increase their independence and safety with mobility to allow discharge.  Bp supine 115/63 Bp seated EOB 94/56 (115 PR) Bp immediate standing 87/52 (116 PR) pt reporting dizziness  Bp s/p SPT to recliner 99/65 (118 PR)    If plan is discharge home, recommend the following: A little help with walking and/or transfers;A little help with bathing/dressing/bathroom;Assistance with cooking/housework;Assist for transportation;Help with stairs or ramp for entrance   Can travel by private vehicle         Equipment Recommendations None recommended by PT  Recommendations for Other Services       Functional Status Assessment Patient has had a recent decline in their functional status and demonstrates the ability to make significant improvements in function in a reasonable and predictable amount of time.     Precautions / Restrictions Precautions Precautions: Fall Recall of Precautions/Restrictions: Intact Restrictions Weight Bearing Restrictions Per Provider Order: No      Mobility  Bed Mobility Overal bed mobility: Needs Assistance Bed Mobility: Supine to Sit     Supine to sit: Min assist     General bed mobility comments: min A to assist to EOB    Transfers Overall transfer level: Needs assistance Equipment used: Rolling walker (2 wheels) Transfers: Sit to/from Stand Sit to Stand: Contact guard assist           General transfer comment: Pt CGA due to dizziness when standing at first. After several minutes attempted again, Pt BP stabilized and she was not dizzy for second transfer attempt. Pt able to stand unsupported no overt LOB, RW for safety and stabiltiy    Ambulation/Gait               General Gait Details: NT due to Bp findings, fatigue and abdominal pain  Stairs            Wheelchair Mobility     Tilt Bed    Modified Rankin (Stroke Patients Only)       Balance Overall balance assessment: Needs assistance, History of Falls (2 falls past 6 months)  Sitting-balance support: Feet supported, No upper extremity supported Sitting balance-Leahy Scale: Good     Standing balance support: No upper extremity supported, During functional activity Standing balance-Leahy Scale: Fair Standing balance comment: static stands unsupported, RW for dynamic mobiltiy for safety due to dizziness                             Pertinent Vitals/Pain Pain Assessment Pain Assessment: Faces Faces Pain Scale: Hurts whole lot Pain Location: R LQ Pain  Descriptors / Indicators: Crying, Cramping, Spasm, Stabbing Pain Intervention(s): Monitored during session, Limited activity within patient's tolerance    Home Living Family/patient expects to be discharged to:: Private residence Living Arrangements: Spouse/significant other Available Help at Discharge: Family;Available 24 hours/day Type of Home: Mobile home (5th wheel (camper)) Home Access: Stairs to enter Entrance Stairs-Rails: Left;Right;Can reach both Entrance Stairs-Number of Steps: 4 Alternate Level Stairs-Number of Steps: 3 Home Layout: Two level Home Equipment: Tub bench;Rolling Walker (2 wheels);Cane - single point;Transport chair Additional Comments: Pt lives with husband in son lives next door, uses son's tub for bathing.    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 LUE Sensation: history of peripheral neuropathy LUE Coordination: WNL    Lower Extremity Assessment Lower Extremity Assessment: Generalized weakness (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)    Cervical / Trunk Assessment Cervical / Trunk Assessment: Normal  Communication   Communication Communication: No apparent difficulties    Cognition Arousal: Alert Behavior During Therapy: WFL for tasks assessed/performed   PT - Cognitive impairments: No apparent impairments                         Following commands: Intact       Cueing Cueing Techniques: Verbal cues     General Comments       Exercises     Assessment/Plan    PT Assessment Patient needs continued PT services  PT Problem List Decreased strength;Decreased activity tolerance;Decreased balance;Decreased mobility;Decreased coordination;Pain;Impaired sensation       PT Treatment Interventions DME instruction;Gait training;Stair training;Functional mobility training;Therapeutic activities;Therapeutic exercise;Balance training;Neuromuscular re-education;Patient/family education    PT Goals (Current goals can be found in the Care Plan section)  Acute Rehab PT Goals Patient Stated Goal: to be able to get better and go home PT Goal Formulation: With patient Time For Goal Achievement: 11/04/24 Potential to Achieve Goals: Good    Frequency Min 3X/week     Co-evaluation PT/OT/SLP Co-Evaluation/Treatment: Yes Reason for Co-Treatment: Necessary to address cognition/behavior during functional activity;For patient/therapist safety;Complexity of the patient's impairments (multi-system involvement) PT goals addressed during session: Mobility/safety with mobility;Balance;Proper use of DME;Strengthening/ROM OT goals addressed during session: ADL's and self-care;Proper use of Adaptive equipment and DME;Strengthening/ROM       AM-PAC PT 6 Clicks Mobility  Outcome Measure Help needed turning from your back to your side while in a flat bed without using bedrails?: None Help needed moving from lying on your back to sitting on the side  of a flat bed without using bedrails?: A Little Help needed moving to and from a bed to a chair (including a wheelchair)?: A Little Help needed standing up from a chair using your arms (e.g., wheelchair or bedside chair)?: A Little Help needed to walk in hospital room?: Total Help needed climbing 3-5 steps with a railing? : Total 6 Click Score: 15    End of Session Equipment Utilized During Treatment: Gait belt Activity Tolerance: Patient limited by fatigue;Patient limited by pain Patient  left: in chair;with chair alarm set;with call bell/phone within reach Nurse Communication: Mobility status PT Visit Diagnosis: Unsteadiness on feet (R26.81);Other abnormalities of gait and mobility (R26.89);Muscle weakness (generalized) (M62.81);History of falling (Z91.81);Pain;Difficulty in walking, not elsewhere classified (R26.2) Pain - Right/Left: Right Pain - part of body:  (abdomen)    Time: 8766-8744 PT Time Calculation (min) (ACUTE ONLY): 22 min   Charges:   PT Evaluation $PT Eval Low Complexity: 1 Low   PT General Charges $$ ACUTE PT VISIT: 1 Visit         [place], PT Acute Rehab   [place] VEAR Drone 10/14/2024, 2:30 PM

## 2024-10-14 NOTE — Plan of Care (Signed)
   Problem: Education: Goal: Knowledge of General Education information will improve Description: Including pain rating scale, medication(s)/side effects and non-pharmacologic comfort measures Outcome: Progressing   Problem: Clinical Measurements: Goal: Ability to maintain clinical measurements within normal limits will improve Outcome: Progressing

## 2024-10-14 NOTE — Progress Notes (Signed)
 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  Also with lightheadedness and multiple falls.  Reportedly had some loose stool before arrival.  At the cancer center her blood pressure was 76/44, she was given 500 mL IV fluids, Ativan  and sent to the ER. In the emergency room afebrile.  On room air.  Blood pressure 85/66.  Creatinine 1.56, WC count 3.3.  Lactic acid was normal.  RVP panel negative.  Head CT was normal.  CT chest abdomen pelvis with a right upper lobe nodule increased in size left upper lobe nodule, new atelectasis or pneumonia in the lingula, faint ground glass opacities in bilateral lungs, large amount of stool throughout the colon.  Given IV fluids, treated with antibiotics for presumed pneumonia and admitted to the hospital. Foley catheter was placed due to retaining urine.  Subjective:  Patient seen and examined.  Denies any complaints at rest today.  Feels much better today.  She had large bowel movement after Fleet enema and MiraLAX  yesterday.  Orthostatics dropped to 60 systolic yesterday evening-remained on overnight IV fluids-still dizziness on standing, blood pressure dropped by 40 points. Appetite has improved.   Assessment & Plan:   Left lower lobe pneumonia: bacterial pneumonia, continue Rocephin  and azithromycin  today. Chest physiotherapy, incentive spirometry, deep breathing exercises, sputum induction, mucolytic's and bronchodilators. Sputum cultures, blood cultures, Legionella and streptococcal antigen. Supplemental oxygen to keep saturations more than 90%. Mobilize with PT OT.  AKI, urinary retention: Treated with IV  fluids.  Normalized.  Continue maintenance fluid.  Urinary retention likely secondary to constipation.  Continue Foley catheter today until mobility improves and constipation improves.  Hypovolemic hypotension/significant orthostatics: Treated with IV fluids.  Continue maintenance fluid today. Patient is off all the blood pressure medications now. If remains symptomatic, will start patient on midodrine .  Breast cancer, recurrent with metastatic colon patient on Talazoparib that is now on hold now.  Oncology following.  Acute metabolic encephalopathy: Likely due to #1.  MRI of the brain without evidence of acute findings or tumors. Mental status improved.  Type 2 diabetes, on insulin  pump with hypoglycemia: Pump removed.  Will keep on low-dose insulin  until able to have oral intake.  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  Sleep apnea: On CPAP.  Anxiety: On Seroquel   Smoker: Counseled to quit.  Constipation: MiraLAX  twice daily, Senokot twice daily, enema as needed.    DVT prophylaxis: rivaroxaban  (XARELTO ) tablet 10 mg Start: 10/13/24 1000 rivaroxaban  (XARELTO ) tablet 10 mg   Code Status: Full code Family Communication: None today Disposition Plan: Status is: Inpatient.  Remains inpatient due to persistent orthostatic symptoms.  Consultants:  Oncology  Procedures:  None  Antimicrobials:  Rocephin  azithromycin  12/19----     Objective: Vitals:   10/14/24 0501 10/14/24 0503 10/14/24 0506 10/14/24 0806  BP: (!) 108/59 106/65 (!) 88/57 124/65  Pulse: (!) 101 (!) 112 (!) 115 93  Resp: 17   (!) 25  Temp:  98.2 F (36.8 C)  97.9 F (36.6 C)  TempSrc:  Oral  Oral  SpO2: 90% 100% 90% 97%  Weight:      Height:        Intake/Output Summary (  Last 24 hours) at 10/14/2024 1107 Last data filed at 10/14/2024 1049 Gross per 24 hour  Intake 4061.31 ml  Output 3400 ml  Net 661.31 ml   Filed Weights   10/12/24 2338  Weight: 60.3 kg    Examination:  General  exam: Appears calm and comfortable.  Pleasant and interactive today. Respiratory system: Scattered bilateral expiratory wheezes all over the lung fields.  On room air. Not in any distress. Cardiovascular system: S1 & S2 heard, RRR.  No pedal edema.  Port-A-Cath present right chest wall. Gastrointestinal system: Abdomen is nondistended, soft and nontender. No organomegaly or masses felt. Normal bowel sounds heard. Foley catheter with clear urine. Central nervous system: Alert and oriented. No focal neurological deficits.  Grossly weak.    Data Reviewed: I have personally reviewed following labs and imaging studies  CBC: Recent Labs  Lab 10/12/24 1011 10/12/24 1654 10/13/24 0037 10/14/24 0337  WBC 3.3* 3.0* 3.1* 4.6  NEUTROABS 2.2 1.8  --  4.1  HGB 11.1* 11.5* 9.8* 8.7*  HCT 32.9* 34.2* 30.0* 26.1*  MCV 108.2* 107.9* 111.5* 109.2*  PLT 86* 88* 68* 70*   Basic Metabolic Panel: Recent Labs  Lab 10/12/24 1011 10/12/24 1654 10/13/24 0037 10/14/24 0337  NA 133* 137 141 139  K 3.4* 3.3* 3.6 4.0  CL 95* 99 106 107  CO2 24 27 26 24   GLUCOSE 116* 68* 82 264*  BUN 31* 26* 17 12  CREATININE 1.56* 1.26* 1.08* 0.94  CALCIUM  9.0 8.8* 8.3* 8.5*   GFR: Estimated Creatinine Clearance: 57.2 mL/min (by C-G formula based on SCr of 0.94 mg/dL). Liver Function Tests: Recent Labs  Lab 10/12/24 1011 10/12/24 1654 10/14/24 0337  AST 32 36 17  ALT 18 19 16   ALKPHOS 97 106 84  BILITOT 0.2 0.3 <0.2  PROT 6.2* 6.2* 4.8*  ALBUMIN 3.5 3.6 2.8*   No results for input(s): LIPASE, AMYLASE in the last 168 hours. Recent Labs  Lab 10/12/24 1654  AMMONIA <13   Coagulation Profile: Recent Labs  Lab 10/12/24 1750  INR 1.3*   Cardiac Enzymes: No results for input(s): CKTOTAL, CKMB, CKMBINDEX, TROPONINI in the last 168 hours. BNP (last 3 results) No results for input(s): PROBNP in the last 8760 hours. HbA1C: No results for input(s): HGBA1C in the last 72  hours. CBG: Recent Labs  Lab 10/13/24 1611 10/13/24 2158 10/14/24 0123 10/14/24 0519 10/14/24 0753  GLUCAP 276* 288* 283* 260* 288*   Lipid Profile: No results for input(s): CHOL, HDL, LDLCALC, TRIG, CHOLHDL, LDLDIRECT in the last 72 hours. Thyroid  Function Tests: No results for input(s): TSH, T4TOTAL, FREET4, T3FREE, THYROIDAB in the last 72 hours. Anemia Panel: No results for input(s): VITAMINB12, FOLATE, FERRITIN, TIBC, IRON, RETICCTPCT in the last 72 hours. Sepsis Labs: Recent Labs  Lab 10/12/24 1603  LATICACIDVEN 1.4    Recent Results (from the past 240 hours)  Culture, blood (Routine x 2)     Status: None (Preliminary result)   Collection Time: 10/12/24  4:03 PM   Specimen: BLOOD  Result Value Ref Range Status   Specimen Description   Final    BLOOD LEFT ANTECUBITAL Performed at [facility], [address] [place], [place] [address]    Special Requests   Final    BOTTLES DRAWN AEROBIC AND ANAEROBIC Blood Culture adequate volume Performed at [facility], [address] [place], [place] [address]    Culture   Final    NO GROWTH 2 DAYS  Performed at [facility], [address] [place], [place] [address]    Report Status PENDING  Incomplete  Urine Culture     Status: None (Preliminary result)   Collection Time: 10/12/24  4:06 PM   Specimen: Urine, Random  Result Value Ref Range Status   Specimen Description   Final    URINE, RANDOM Performed at [facility], [address] [place], [place] [address]    Special Requests   Final    NONE Reflexed from [id number] Performed at [facility], [address] [place], [place] [address]    Culture   Final    CULTURE REINCUBATED FOR BETTER GROWTH Performed at [facility], [address] [place], [place] [address]    Report Status PENDING  Incomplete  Culture, blood (Routine x 2)     Status: None (Preliminary result)    Collection Time: 10/12/24  4:08 PM   Specimen: BLOOD  Result Value Ref Range Status   Specimen Description   Final    BLOOD RIGHT ANTECUBITAL Performed at [facility], [address] [place], [place] [address]    Special Requests   Final    BOTTLES DRAWN AEROBIC AND ANAEROBIC Blood Culture adequate volume Performed at [facility], [address] [place], [place] [address]    Culture   Final    NO GROWTH 2 DAYS Performed at [facility], [address] [place], [place] [address]    Report Status PENDING  Incomplete  Resp panel by RT-PCR (RSV, Flu A&B, Covid) Anterior Nasal Swab     Status: None   Collection Time: 10/12/24  4:54 PM   Specimen: Anterior Nasal Swab  Result Value Ref Range Status   SARS Coronavirus 2 by RT PCR NEGATIVE NEGATIVE Final    Comment: (NOTE) SARS-CoV-2 target nucleic acids are NOT DETECTED.  The SARS-CoV-2 RNA is generally detectable in upper respiratory specimens during the acute phase of infection. The lowest concentration of SARS-CoV-2 viral copies this assay can detect is 138 copies/mL. A negative result does not preclude SARS-Cov-2 infection and should not be used as the sole basis for treatment or other patient management decisions. A negative result may occur with  improper specimen collection/handling, submission of specimen other than nasopharyngeal swab, presence of viral mutation(s) within the areas targeted by this assay, and inadequate number of viral copies(<138 copies/mL). A negative result must be combined with clinical observations, patient history, and epidemiological information. The expected result is Negative.  Fact Sheet for Patients:  bloggercourse.com  Fact Sheet for Healthcare Providers:  seriousbroker.it  This test is no t yet approved or cleared by the [place]  FDA and  has been authorized for detection and/or diagnosis of SARS-CoV-2  by FDA under an Emergency Use Authorization (EUA). This EUA will remain  in effect (meaning this test can be used) for the duration of the COVID-19 declaration under Section 564(b)(1) of the Act, 21 U.S.C.section 360bbb-3(b)(1), unless the authorization is terminated  or revoked sooner.       Influenza A by PCR NEGATIVE NEGATIVE Final   Influenza B by PCR NEGATIVE NEGATIVE Final    Comment: (NOTE) The Xpert Xpress SARS-CoV-2/FLU/RSV plus assay is intended as an aid in the diagnosis of influenza from Nasopharyngeal swab specimens and should not be used as a sole basis for treatment. Nasal washings and aspirates are unacceptable for Xpert Xpress SARS-CoV-2/FLU/RSV testing.  Fact  Sheet for Patients: bloggercourse.com  Fact Sheet for Healthcare Providers: seriousbroker.it  This test is not yet approved or cleared by the [place]  FDA and has been authorized for detection and/or diagnosis of SARS-CoV-2 by FDA under an Emergency Use Authorization (EUA). This EUA will remain in effect (meaning this test can be used) for the duration of the COVID-19 declaration under Section 564(b)(1) of the Act, 21 U.S.C. section 360bbb-3(b)(1), unless the authorization is terminated or revoked.     Resp Syncytial Virus by PCR NEGATIVE NEGATIVE Final    Comment: (NOTE) Fact Sheet for Patients: bloggercourse.com  Fact Sheet for Healthcare Providers: seriousbroker.it  This test is not yet approved or cleared by the [place]  FDA and has been authorized for detection and/or diagnosis of SARS-CoV-2 by FDA under an Emergency Use Authorization (EUA). This EUA will remain in effect (meaning this test can be used) for the duration of the COVID-19 declaration under Section 564(b)(1) of the Act, 21 U.S.C. section 360bbb-3(b)(1), unless the authorization is terminated or revoked.  Performed at [facility], [address] [place], [place] [address]          Radiology Studies: MR BRAIN W WO CONTRAST Result Date: 10/13/2024 EXAM: MRI BRAIN WITH AND WITHOUT CONTRAST 10/13/2024 09:48:11 AM TECHNIQUE: Multiplanar multisequence MRI of the head/brain was performed with and without the administration of intravenous contrast. 6 mL (gadobutrol  (GADAVIST ) 1 MMOL/ML injection 6 mL GADOBUTROL  1 MMOL/ML IV SOLN) was administered. COMPARISON: MRI of the head dated 03/02/2024. CLINICAL HISTORY: Breast cancer, invasive, stage IV, initial workup; She has been having some dizziness. She has had some episodes of falling. FINDINGS: BRAIN AND VENTRICLES: No acute infarct. No acute intracranial hemorrhage. No mass effect or midline shift. No hydrocephalus. A focal area of dural thickening is again seen, measuring 7 mm in diameter, which remains compatible with a meningioma. There are no findings suspicious for metastatic disease. There is no significant change from the previous study. The sella is unremarkable. Normal flow voids. ORBITS: The patient is status post bilateral lens replacement. No acute abnormality. SINUSES: Mild mucosal disease is present within the left maxillary sinus. No acute abnormality. BONES AND SOFT TISSUES: Normal bone marrow signal and enhancement. No acute soft tissue abnormality. IMPRESSION: 1. No acute intracranial abnormality. 2. Stable 7 mm meningioma without significant interval change. 3. No findings suspicious for metastatic disease. 4. Mild left maxillary sinus mucosal disease. Electronically signed by: Evalene Coho MD 10/13/2024 10:48 AM EST RP Workstation: [id number]   CT CHEST ABDOMEN PELVIS W CONTRAST Result Date: 10/12/2024 EXAM: CT CHEST, ABDOMEN AND PELVIS WITH CONTRAST 10/12/2024 05:50:57 PM TECHNIQUE: CT of the chest, abdomen and pelvis was performed with the administration of intravenous contrast. Multiplanar reformatted images are provided for review.  Automated exposure control, iterative reconstruction, and/or weight based adjustment of the mA/kV was utilized to reduce the radiation dose to as low as reasonably achievable. COMPARISON: Comparison date: 10/03/2024. CLINICAL HISTORY: Cough, n/v/d, hypotension, cancer hx. FINDINGS: CHEST: MEDIASTINUM AND LYMPH NODES: Right chest port catheter tip ends in the SVC. There are atherosclerotic calcifications of the aorta. LUNGS AND PLEURA: New atelectasis/consolidation in the lingula. Post radiation changes are again seen in the anterior inferior left upper lobe. There is a 4 mm left upper lobe nodule on image 7/40 which has increased in size. There is some very faint ground glass opacities diffusely throughout both lungs. There is a new right upper lobe pulmonary nodule image 7/69 measuring 4 mm. ABDOMEN AND PELVIS:  LIVER: The liver is unremarkable. GALLBLADDER AND BILE DUCTS: Gallbladder is surgically absent. SPLEEN: No acute abnormality. PANCREAS: No acute abnormality. ADRENAL GLANDS: No acute abnormality. KIDNEYS, URETERS AND BLADDER: Punctate calculi in the left kidney are unchanged. No hydronephrosis. No perinephric or periureteral stranding. Urinary bladder is unremarkable. GI AND BOWEL: There is a very large amount of stool throughout the entire colon. There are some air fluid levels in the proximal colon. There are some relative areas of narrowing throughout the transverse colon which may represent peristalsis, but stricture or mass not excluded. There is no bowel obstruction. Appendix appears normal. There are also scattered air fluid levels throughout distended distal ileal loops. Jejunum and stomach are within normal limits. REPRODUCTIVE ORGANS: No acute abnormality. PERITONEUM AND RETROPERITONEUM: No ascites. No free air. VASCULATURE: Aorta is normal in caliber, but there are atherosclerotic calcifications of the aorta and iliac arteries. ABDOMINAL AND PELVIS LYMPH NODES: No lymphadenopathy. BONES AND SOFT  TISSUES: There is wall thickening throughout the esophagus. There is skin thickening of the left breast with diffuse edema throughout the left breast. Surgical clips and calcifications are again noted in the left breast. Medication injection sites are noted in the subcutaneous tissues of the anterior abdominal wall. IMPRESSION: 1. New 4 mm right upper lobe pulmonary nodule and increased size of the 4 mm left upper lobe nodule, concerning for metastatic disease. 2. New atelectasis/consolidation in the lingula. 3. Very faint ground glass opacities diffusely throughout both lungs. Findings may be infectious/inflammatory or related to mild edema. 4. Wall thickening throughout the esophagus. Correlate clinically for esophagitis. 5. Very large amount of stool throughout the entire colon with some air-fluid levels in the proximal colon, without bowel obstruction. Findings are concerning for colonic ileus and/or constipation. 6. Similar left breast skin thickening and edema with surgical clips and calcifications. 7. Left renal calculi. Electronically signed by: Greig Pique MD 10/12/2024 06:32 PM EST RP Workstation: [id number]   CT Head Wo Contrast Result Date: 10/12/2024 CLINICAL DATA:  Altered mental status. Weakness and confusion. Active chemotherapy and radiation for cancer. EXAM: CT HEAD WITHOUT CONTRAST TECHNIQUE: Contiguous axial images were obtained from the base of the skull through the vertex without intravenous contrast. RADIATION DOSE REDUCTION: This exam was performed according to the departmental dose-optimization program which includes automated exposure control, adjustment of the mA and/or kV according to patient size and/or use of iterative reconstruction technique. COMPARISON:  Brain MRI 03/02/2024 FINDINGS: Brain: No acute intracranial hemorrhage. No evidence of acute ischemia. No hydrocephalus, the basilar cisterns are patent. The subcentimeter dural-based lesion overlying the left middle frontal  gyrus on prior MRI has no CT correlate. No evidence of intracranial mass or edema. No midline shift. No subdural or extra-axial collection. Vascular: No hyperdense vessel or unexpected calcification. Skull: No fracture or focal lesion. Sinuses/Orbits: Trace mucosal thickening involving right side of sphenoid sinus. Occasional mucosal thickening of ethmoid air cells. No mastoid effusion. Bilateral lens resection. Other: None. IMPRESSION: No acute intracranial abnormality. Electronically Signed   By: Andrea Gasman M.D.   On: 10/12/2024 18:26        Scheduled Meds:  azithromycin   500 mg Oral Daily   bisacodyl   10 mg Rectal Once   Chlorhexidine  Gluconate Cloth  6 each Topical Daily   gabapentin   400 mg Oral BID   insulin  aspart  0-5 Units Subcutaneous QHS   insulin  aspart  0-9 Units Subcutaneous TID WC   ipratropium-albuterol   3 mL Nebulization TID   methylPREDNISolone  (SOLU-MEDROL ) injection  81.25  mg Intravenous Q12H   pantoprazole   40 mg Oral Daily   polyethylene glycol  17 g Oral BID   QUEtiapine   800 mg Oral QHS   rivaroxaban   10 mg Oral Daily   rosuvastatin   40 mg Oral Daily   senna  1 tablet Oral BID   sodium chloride  flush  10-40 mL Intracatheter Q12H   sodium chloride  flush  10-40 mL Intracatheter Q12H   sodium chloride  flush  3 mL Intravenous Q12H   Continuous Infusions:  cefTRIAXone  (ROCEPHIN )  IV Stopped (10/14/24 0544)     LOS: 1 day    Time spent: 55 minutes    Renato Applebaum, MD [facility]   "

## 2024-10-14 NOTE — Evaluation (Addendum)
 Occupational Therapy Evaluation Patient Details Name: Ariana Herrera MRN: [id number] DOB: 05-15-1966 Today's Date: 10/14/2024   History of Present Illness   58 yo female presents to therapy from cancer center due to hypotension and directed to hospital 10/22/2024 due to poor PO intake, fatigue and cough x 3 wks. Pt also endorses light headedness and multiple recent falls. Pt found to have PNA, acute metabolic encephalopathy,  and urinary retention. 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     Clinical Impressions Pt resting in bed comfortably, c/o sharp pain in lower right abdomen, sharp pain causing her to lie down for a couple of minutes, stated it happened earlier this morning once as well. Pt lives with husband in mobile home, son lives next door, PLOF mod I without AD. Pt reports falling down her steps in her house a couple of times, 4 steps in mobile home to get to bed/bath. Pt currently doing well, overall CGA with RW for mobility, set up for ADLs. Pt with positive orthostatics when first standing, having to sit down after 20 seconds of standing, BP 84/53. Improved to 93/66 after a few minutes, no dizziness during second attempt. Pt at this time has no further acute OT needs, mobility to follow, no OT follow up needed.      If plan is discharge home, recommend the following:   A little help with walking and/or transfers;Assistance with cooking/housework;Assist for transportation;Help with stairs or ramp for entrance     Functional Status Assessment   Patient has had a recent decline in their functional status and demonstrates the ability to make significant improvements in function in a reasonable and predictable amount of time.     Equipment Recommendations   None recommended by OT     Recommendations for Other Services          Precautions/Restrictions   Precautions Precautions: Fall Recall of Precautions/Restrictions: Intact Restrictions Weight Bearing Restrictions Per Provider Order: No     Mobility Bed Mobility Overal bed mobility: Needs Assistance Bed Mobility: Supine to Sit     Supine to sit: Min assist     General bed mobility comments: min A to assist to EOB    Transfers Overall transfer level: Needs assistance Equipment used: Rolling walker (2 wheels) Transfers: Sit to/from Stand Sit to Stand: Contact guard assist           General transfer comment: Pt CGA due to dizziness when standing at first. After several minutes attempted again, Pt BP stabilized and she was not dizzy for second transfer attempt. Pt able to stand unsupported, RW for safety      Balance Overall balance assessment: Needs assistance Sitting-balance support: Feet supported, No upper extremity supported Sitting balance-Leahy Scale: Good     Standing balance support: No upper extremity supported, During functional activity Standing balance-Leahy Scale: Fair Standing balance comment: static stands unsupported, RW for safety due to dizziness                           ADL either performed or assessed with clinical judgement   ADL Overall ADL's : Needs assistance/impaired  General ADL Comments: set up/supervision for safety     Vision Baseline Vision/History: 0 No visual deficits Ability to See in Adequate Light: 0 Adequate Patient Visual Report: No change from baseline       Perception         Praxis         Pertinent Vitals/Pain       Extremity/Trunk Assessment Upper Extremity Assessment Upper Extremity Assessment: Defer to OT evaluation RUE Deficits / Details: WFLs, but 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 RUE Sensation: history of peripheral neuropathy RUE Coordination: WNL LUE Deficits / Details: WFLs, but has peripheral  neuropathy from chemo LUE Sensation: history of peripheral neuropathy LUE Coordination: WNL   Lower Extremity Assessment Lower Extremity Assessment: Generalized weakness (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)   Cervical / Trunk Assessment Cervical / Trunk Assessment: Normal   Communication Communication Communication: No apparent difficulties   Cognition Arousal: Alert Behavior During Therapy: WFL for tasks assessed/performed Cognition: No apparent impairments                               Following commands: Intact       Cueing  General Comments   Cueing Techniques: Verbal cues      Exercises     Shoulder Instructions      Home Living Family/patient expects to be discharged to:: Private residence Living Arrangements: Spouse/significant other Available Help at Discharge: Family;Available 24 hours/day Type of Home: Mobile home (5th wheel (camper)) Home Access: Stairs to enter Entrance Stairs-Number of Steps: 4 Entrance Stairs-Rails: Left;Right;Can reach both Home Layout: Two level Alternate Level Stairs-Number of Steps: 3 Alternate Level Stairs-Rails: Can reach both Bathroom Shower/Tub: Tub/shower unit         Home Equipment: Tub bench;Rolling Walker (2 wheels);Cane - single point;Transport chair   Additional Comments: Pt lives with husband in son lives next door, uses son's tub for bathing.      Prior Functioning/Environment Prior Level of Function : Independent/Modified Independent             Mobility Comments: typically independent, has RW, has transport chair if needed for community distances ADLs Comments: mod I    OT Problem List: Decreased activity tolerance;Impaired balance (sitting and/or standing)   OT Treatment/Interventions:        OT Goals(Current goals can be found in the care plan section)   Acute Rehab OT Goals Patient Stated Goal: to return home OT Goal Formulation: With patient Time For Goal Achievement:  10/28/24 Potential to Achieve Goals: Good   OT Frequency:       Co-evaluation PT/OT/SLP Co-Evaluation/Treatment: Yes Reason for Co-Treatment: Necessary to address cognition/behavior during functional activity;For patient/therapist safety;Complexity of the patient's impairments (multi-system involvement) PT goals addressed during session: Mobility/safety with mobility;Balance;Proper use of DME;Strengthening/ROM OT goals addressed during session: ADL's and self-care;Proper use of Adaptive equipment and DME;Strengthening/ROM      AM-PAC OT 6 Clicks Daily Activity     Outcome Measure Help from another person eating meals?: None Help from another person taking care of personal grooming?: A Little Help from another person toileting, which includes using toliet, bedpan, or urinal?: A Little Help from another person bathing (including washing, rinsing, drying)?: A Little Help from another person to put on and taking off regular upper body clothing?: None Help from another person to put on and taking off regular lower body clothing?: A Little 6 Click Score: 20   End of Session  Equipment Utilized During Treatment: Gait belt;Rolling walker (2 wheels) Nurse Communication: Mobility status  Activity Tolerance: Patient tolerated treatment well Patient left: in chair;with call bell/phone within reach;with chair alarm set  OT Visit Diagnosis: Muscle weakness (generalized) (M62.81);Unsteadiness on feet (R26.81)                Time: 8765-8741 OT Time Calculation (min): 24 min Charges:  OT General Charges $OT Visit: 1 Visit OT Evaluation $OT Eval Low Complexity: 1 Low  [address], OTR/L   Ariana Herrera 10/14/2024, 2:47 PM

## 2024-10-15 ENCOUNTER — Ambulatory Visit: Admitting: Physical Therapy

## 2024-10-15 ENCOUNTER — Ambulatory Visit: Attending: Hematology & Oncology | Admitting: Physical Therapy

## 2024-10-15 ENCOUNTER — Encounter: Payer: Self-pay | Admitting: Hematology & Oncology

## 2024-10-15 ENCOUNTER — Encounter: Payer: Self-pay | Admitting: Family

## 2024-10-15 ENCOUNTER — Other Ambulatory Visit (HOSPITAL_COMMUNITY): Payer: Self-pay

## 2024-10-15 DIAGNOSIS — E1165 Type 2 diabetes mellitus with hyperglycemia: Secondary | ICD-10-CM | POA: Diagnosis not present

## 2024-10-15 DIAGNOSIS — N179 Acute kidney failure, unspecified: Secondary | ICD-10-CM | POA: Diagnosis not present

## 2024-10-15 DIAGNOSIS — G9341 Metabolic encephalopathy: Secondary | ICD-10-CM | POA: Diagnosis not present

## 2024-10-15 DIAGNOSIS — J189 Pneumonia, unspecified organism: Secondary | ICD-10-CM | POA: Diagnosis not present

## 2024-10-15 DIAGNOSIS — Z794 Long term (current) use of insulin: Secondary | ICD-10-CM | POA: Diagnosis not present

## 2024-10-15 DIAGNOSIS — C50011 Malignant neoplasm of nipple and areola, right female breast: Secondary | ICD-10-CM | POA: Diagnosis not present

## 2024-10-15 LAB — CBC WITH DIFFERENTIAL/PLATELET
Abs Immature Granulocytes: 0.04 K/uL (ref 0.00–0.07)
Basophils Absolute: 0 K/uL (ref 0.0–0.1)
Basophils Relative: 0 %
Eosinophils Absolute: 0 K/uL (ref 0.0–0.5)
Eosinophils Relative: 0 %
HCT: 27.9 % — ABNORMAL LOW (ref 36.0–46.0)
Hemoglobin: 9.4 g/dL — ABNORMAL LOW (ref 12.0–15.0)
Immature Granulocytes: 1 %
Lymphocytes Relative: 4 %
Lymphs Abs: 0.3 K/uL — ABNORMAL LOW (ref 0.7–4.0)
MCH: 36.3 pg — ABNORMAL HIGH (ref 26.0–34.0)
MCHC: 33.7 g/dL (ref 30.0–36.0)
MCV: 107.7 fL — ABNORMAL HIGH (ref 80.0–100.0)
Monocytes Absolute: 0.1 K/uL (ref 0.1–1.0)
Monocytes Relative: 1 %
Neutro Abs: 7.5 K/uL (ref 1.7–7.7)
Neutrophils Relative %: 94 %
Platelets: 72 K/uL — ABNORMAL LOW (ref 150–400)
RBC: 2.59 MIL/uL — ABNORMAL LOW (ref 3.87–5.11)
RDW: 15.7 % — ABNORMAL HIGH (ref 11.5–15.5)
WBC: 7.9 K/uL (ref 4.0–10.5)
nRBC: 0 % (ref 0.0–0.2)

## 2024-10-15 LAB — HEPATIC FUNCTION PANEL
ALT: 25 U/L (ref 0–44)
AST: 28 U/L (ref 15–41)
Albumin: 2.9 g/dL — ABNORMAL LOW (ref 3.5–5.0)
Alkaline Phosphatase: 86 U/L (ref 38–126)
Bilirubin, Direct: 0.1 mg/dL (ref 0.0–0.2)
Total Bilirubin: <0.2 mg/dL (ref 0.0–1.2)
Total Protein: 5.4 g/dL — ABNORMAL LOW (ref 6.5–8.1)

## 2024-10-15 LAB — URINE CULTURE: Culture: >=100000 — AB

## 2024-10-15 LAB — GLUCOSE, CAPILLARY
Glucose-Capillary: 291 mg/dL — ABNORMAL HIGH (ref 70–99)
Glucose-Capillary: 282 mg/dL — ABNORMAL HIGH (ref 70–99)
Glucose-Capillary: 412 mg/dL — ABNORMAL HIGH (ref 70–99)
Glucose-Capillary: 319 mg/dL — ABNORMAL HIGH (ref 70–99)

## 2024-10-15 LAB — BASIC METABOLIC PANEL WITH GFR
Anion gap: 10 (ref 5–15)
BUN: 11 mg/dL (ref 6–20)
CO2: 24 mmol/L (ref 22–32)
Calcium: 9.1 mg/dL (ref 8.9–10.3)
Chloride: 103 mmol/L (ref 98–111)
Creatinine, Ser: 0.91 mg/dL (ref 0.44–1.00)
GFR, Estimated: >60 mL/min
Glucose, Bld: 295 mg/dL — ABNORMAL HIGH (ref 70–99)
Potassium: 3.8 mmol/L (ref 3.5–5.1)
Sodium: 136 mmol/L (ref 135–145)

## 2024-10-15 MED ORDER — LINACLOTIDE 145 MCG PO CAPS
145.0000 ug | ORAL_CAPSULE | Freq: Every day | ORAL | Status: DC
  Administered 2024-10-15: 145 ug via ORAL
  Filled 2024-10-15: qty 1

## 2024-10-15 MED ORDER — BISACODYL 10 MG RE SUPP
10.0000 mg | Freq: Once | RECTAL | Status: AC
  Administered 2024-10-15: 10 mg via RECTAL
  Filled 2024-10-15: qty 1

## 2024-10-15 MED ORDER — CEPHALEXIN 500 MG PO CAPS: 500.0000 mg | ORAL_CAPSULE | Freq: Three times a day (TID) | ORAL | Status: DC

## 2024-10-15 MED ORDER — SENNA 8.6 MG PO TABS
1.0000 | ORAL_TABLET | Freq: Two times a day (BID) | ORAL | 0 refills | Status: AC
  Filled 2024-10-15: qty 120, 60d supply, fill #0

## 2024-10-15 MED ORDER — AZITHROMYCIN 250 MG PO TABS
250.0000 mg | ORAL_TABLET | Freq: Every day | ORAL | 0 refills | Status: AC
  Filled 2024-10-15: qty 2, 2d supply, fill #0

## 2024-10-15 MED ORDER — CEPHALEXIN 500 MG PO CAPS
500.0000 mg | ORAL_CAPSULE | Freq: Three times a day (TID) | ORAL | 0 refills | Status: AC
  Filled 2024-10-15: qty 12, 4d supply, fill #0

## 2024-10-15 MED ORDER — MIDODRINE HCL 5 MG PO TABS
5.0000 mg | ORAL_TABLET | Freq: Three times a day (TID) | ORAL | 0 refills | Status: AC
  Filled 2024-10-15: qty 90, 30d supply, fill #0

## 2024-10-15 MED ADMIN — Insulin Glargine Inj 100 Unit/ML: 10 [IU] | SUBCUTANEOUS | NDC 00088222033

## 2024-10-15 MED FILL — Insulin Glargine Inj 100 Unit/ML: 10.0000 [IU] | SUBCUTANEOUS | Qty: 0.1 | Status: AC

## 2024-10-15 NOTE — Progress Notes (Signed)
 " PROGRESS NOTE    Ariana Herrera  FMW:[id number] DOB: 06-13-1966 DOA: 10/12/2024 PCP: Almarie Waddell NOVAK, NP    Brief Narrative:  58 year old with history of type 2 diabetes on insulin  pump, coronary artery disease, history of left IJ thrombus on Xarelto , chronic constipation, recurrent breast cancer who presented to cancer center with about 3 weeks of cough, fatigue, loss of appetite.  Also with lightheadedness and multiple falls.  Reportedly had some loose stool before arrival.  At the cancer center her blood pressure was 76/44, she was given 500 mL IV fluids, Ativan  and sent to the ER. In the emergency room afebrile.  On room air.  Blood pressure 85/66.  Creatinine 1.56, WC count 3.3.  Lactic acid was normal.  RVP panel negative.  Head CT was normal.  CT chest abdomen pelvis with a right upper lobe nodule increased in size left upper lobe nodule, new atelectasis or pneumonia in the lingula, faint ground glass opacities in bilateral lungs, large amount of stool throughout the colon.  Given IV fluids, treated with antibiotics for presumed pneumonia and admitted to the hospital. Foley catheter was placed due to retaining urine.  Subjective:  Patient seen and examined.  Denies any complaints at rest.  Denies any nausea vomiting.  She is able to tolerate solid food. No bowel movement for the last 2 days despite being on MiraLAX  and Senokot. Still gets dizzy while standing, morning orthostatics not available.  She is tolerating standing better today. Denies any cough fever or sputum production. Sugars 400.  Eating regular diet.    Assessment & Plan:   Left lower lobe pneumonia: bacterial pneumonia, on Rocephin  and azithromycin .  Will change to oral antibiotics to complete 7 days of therapy. Chest physiotherapy, incentive spirometry, deep breathing exercises, sputum induction, mucolytic's and bronchodilators. Negative cultures. Supplemental oxygen to keep saturations more than 90%.  On room  air now. Team to mobilize. Patient on high-dose steroids, will discontinue steroids.  AKI, urinary retention: Treated with IV fluids.  Normalized.  Discontinue Foley catheter.    Acute UTI present on admission: Growing strep.  Treated with Rocephin .  Will continue Keflex  for 4 more days.  Hypovolemic hypotension/significant orthostatics: Treated with IV fluids.  Discontinue IV fluids. Midodrine  5 mg 3 times daily.  Breast cancer, recurrent with metastatic colon patient on Talazoparib that is now on hold now.  Oncology following.  Acute metabolic encephalopathy: Likely due to #1.  MRI of the brain without evidence of acute findings or tumors. Mental status improved.  Normalized.  Type 2 diabetes, on insulin  pump with hypoglycemia: Pump removed.  Will keep on low-dose insulin  until able to have oral intake. Now with elevated blood sugars.  Start long-acting insulin  10 units, keep on sliding scale insulin .  Steroids to discontinue.  Diet to carb controlled.  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  Sleep apnea: On CPAP.  Using at night.  Anxiety: On Seroquel   Smoker: Counseled to quit.  Constipation: MiraLAX  twice daily, Senokot twice daily, 1 dose of Dulcolax suppository today.  Check orthostatics, discontinue Foley catheter.  Mobilize.  Discussed about orthostatic precautions. Hopefully home tomorrow if able to mobilize.    DVT prophylaxis: rivaroxaban  (XARELTO ) tablet 10 mg Start: 10/13/24 1000 rivaroxaban  (XARELTO ) tablet 10 mg   Code Status: Full code Family Communication: None today Disposition Plan: Status is: Inpatient.  Remains inpatient due to persistent orthostatic symptoms.  Consultants:  Oncology  Procedures:  None  Antimicrobials:  Rocephin  azithromycin  12/19----12/22 Keflex  12/22---  Objective: Vitals:   10/14/24 2033 10/14/24 2243 10/15/24 0525 10/15/24 0908  BP:   126/70   Pulse:  91 93   Resp:      Temp:   98.1 F (36.7 C)   TempSrc:    Oral   SpO2: 95%  96% 94%  Weight:      Height:        Intake/Output Summary (Last 24 hours) at 10/15/2024 1127 Last data filed at 10/15/2024 1056 Gross per 24 hour  Intake --  Output 3175 ml  Net -3175 ml   Filed Weights   10/12/24 2338  Weight: 60.3 kg    Examination:  General exam: Appears calm and comfortable.  Pleasant and interactive today. Respiratory system: Bilateral clear.  On room air. Not in any distress. Cardiovascular system: S1 & S2 heard, RRR.  No pedal edema.  Port-A-Cath present right chest wall. Gastrointestinal system: Abdomen is nondistended, soft and nontender. No organomegaly or masses felt. Normal bowel sounds heard. Foley catheter with clear urine. Central nervous system: Alert and oriented. No focal neurological deficits.      Data Reviewed: I have personally reviewed following labs and imaging studies  CBC: Recent Labs  Lab 10/12/24 1011 10/12/24 1654 10/13/24 0037 10/14/24 0337 10/15/24 0317  WBC 3.3* 3.0* 3.1* 4.6 7.9  NEUTROABS 2.2 1.8  --  4.1 7.5  HGB 11.1* 11.5* 9.8* 8.7* 9.4*  HCT 32.9* 34.2* 30.0* 26.1* 27.9*  MCV 108.2* 107.9* 111.5* 109.2* 107.7*  PLT 86* 88* 68* 70* 72*   Basic Metabolic Panel: Recent Labs  Lab 10/12/24 1011 10/12/24 1654 10/13/24 0037 10/14/24 0337 10/15/24 0317  NA 133* 137 141 139 136  K 3.4* 3.3* 3.6 4.0 3.8  CL 95* 99 106 107 103  CO2 24 27 26 24 24   GLUCOSE 116* 68* 82 264* 295*  BUN 31* 26* 17 12 11   CREATININE 1.56* 1.26* 1.08* 0.94 0.91  CALCIUM  9.0 8.8* 8.3* 8.5* 9.1   GFR: Estimated Creatinine Clearance: 59.1 mL/min (by C-G formula based on SCr of 0.91 mg/dL). Liver Function Tests: Recent Labs  Lab 10/12/24 1011 10/12/24 1654 10/14/24 0337 10/15/24 0317  AST 32 36 17 28  ALT 18 19 16 25   ALKPHOS 97 106 84 86  BILITOT 0.2 0.3 <0.2 <0.2  PROT 6.2* 6.2* 4.8* 5.4*  ALBUMIN 3.5 3.6 2.8* 2.9*   No results for input(s): LIPASE, AMYLASE in the last 168 hours. Recent Labs   Lab 10/12/24 1654  AMMONIA <13   Coagulation Profile: Recent Labs  Lab 10/12/24 1750  INR 1.3*   Cardiac Enzymes: No results for input(s): CKTOTAL, CKMB, CKMBINDEX, TROPONINI in the last 168 hours. BNP (last 3 results) No results for input(s): PROBNP in the last 8760 hours. HbA1C: No results for input(s): HGBA1C in the last 72 hours. CBG: Recent Labs  Lab 10/14/24 2053 10/14/24 2340 10/15/24 0356 10/15/24 0737 10/15/24 1117  GLUCAP 321* 255* 291* 282* 412*   Lipid Profile: No results for input(s): CHOL, HDL, LDLCALC, TRIG, CHOLHDL, LDLDIRECT in the last 72 hours. Thyroid  Function Tests: No results for input(s): TSH, T4TOTAL, FREET4, T3FREE, THYROIDAB in the last 72 hours. Anemia Panel: No results for input(s): VITAMINB12, FOLATE, FERRITIN, TIBC, IRON, RETICCTPCT in the last 72 hours. Sepsis Labs: Recent Labs  Lab 10/12/24 1603  LATICACIDVEN 1.4    Recent Results (from the past 240 hours)  Culture, blood (Routine x 2)     Status: None (Preliminary result)   Collection Time: 10/12/24  4:03 PM  Specimen: BLOOD  Result Value Ref Range Status   Specimen Description   Final    BLOOD LEFT ANTECUBITAL Performed at [facility], [address] [place], [place] [address]    Special Requests   Final    BOTTLES DRAWN AEROBIC AND ANAEROBIC Blood Culture adequate volume Performed at [facility], [address] [place], [place] [address]    Culture   Final    NO GROWTH 2 DAYS Performed at [facility], [address], [place], [place] [address]    Report Status PENDING  Incomplete  Urine Culture     Status: Abnormal   Collection Time: 10/12/24  4:06 PM   Specimen: Urine, Random  Result Value Ref Range Status   Specimen Description   Final    URINE, RANDOM Performed at [facility], [address] [place], [place] [address]    Special Requests   Final    NONE Reflexed from  [id number] Performed at [facility], [address] [place] [address] [place], [place] [address]    Culture (A)  Final    >=100,000 COLONIES/mL GROUP B STREP(S.AGALACTIAE)ISOLATED TESTING AGAINST S. AGALACTIAE NOT ROUTINELY PERFORMED DUE TO PREDICTABILITY OF AMP/PEN/VAN SUSCEPTIBILITY. 30,000 COLONIES/mL ESCHERICHIA COLI    Report Status 10/15/2024 FINAL  Final   Organism ID, Bacteria ESCHERICHIA COLI (A)  Final      Susceptibility   Escherichia coli - MIC*    AMPICILLIN >=32 RESISTANT Resistant     CEFAZOLIN (URINE) Value in next row Sensitive      2 SENSITIVEThis is a modified FDA-approved test that has been validated and its performance characteristics determined by the reporting laboratory.  This laboratory is certified under the Clinical Laboratory Improvement Amendments CLIA as qualified to perform high complexity clinical laboratory testing.    CEFEPIME  Value in next row Sensitive      2 SENSITIVEThis is a modified FDA-approved test that has been validated and its performance characteristics determined by the reporting laboratory.  This laboratory is certified under the Clinical Laboratory Improvement Amendments CLIA as qualified to perform high complexity clinical laboratory testing.    ERTAPENEM Value in next row Sensitive      2 SENSITIVEThis is a modified FDA-approved test that has been validated and its performance characteristics determined by the reporting laboratory.  This laboratory is certified under the Clinical Laboratory Improvement Amendments CLIA as qualified to perform high complexity clinical laboratory testing.    CEFTRIAXONE  Value in next row Sensitive      2 SENSITIVEThis is a modified FDA-approved test that has been validated and its performance characteristics determined by the reporting laboratory.  This laboratory is certified under the Clinical Laboratory Improvement Amendments CLIA as qualified to perform high complexity clinical laboratory testing.    CIPROFLOXACIN Value  in next row Sensitive      2 SENSITIVEThis is a modified FDA-approved test that has been validated and its performance characteristics determined by the reporting laboratory.  This laboratory is certified under the Clinical Laboratory Improvement Amendments CLIA as qualified to perform high complexity clinical laboratory testing.    GENTAMICIN Value in next row Sensitive      2 SENSITIVEThis is a modified FDA-approved test that has been validated and its performance characteristics determined by the reporting laboratory.  This laboratory is certified under the Clinical Laboratory Improvement Amendments CLIA as qualified to perform high complexity clinical laboratory testing.    NITROFURANTOIN  Value in next row Intermediate  2 SENSITIVEThis is a modified FDA-approved test that has been validated and its performance characteristics determined by the reporting laboratory.  This laboratory is certified under the Clinical Laboratory Improvement Amendments CLIA as qualified to perform high complexity clinical laboratory testing.    TRIMETH /SULFA  Value in next row Resistant      2 SENSITIVEThis is a modified FDA-approved test that has been validated and its performance characteristics determined by the reporting laboratory.  This laboratory is certified under the Clinical Laboratory Improvement Amendments CLIA as qualified to perform high complexity clinical laboratory testing.    AMPICILLIN/SULBACTAM Value in next row Resistant      2 SENSITIVEThis is a modified FDA-approved test that has been validated and its performance characteristics determined by the reporting laboratory.  This laboratory is certified under the Clinical Laboratory Improvement Amendments CLIA as qualified to perform high complexity clinical laboratory testing.    PIP/TAZO Value in next row Sensitive      8 SENSITIVEThis is a modified FDA-approved test that has been validated and its performance characteristics determined by the  reporting laboratory.  This laboratory is certified under the Clinical Laboratory Improvement Amendments CLIA as qualified to perform high complexity clinical laboratory testing.    MEROPENEM Value in next row Sensitive      8 SENSITIVEThis is a modified FDA-approved test that has been validated and its performance characteristics determined by the reporting laboratory.  This laboratory is certified under the Clinical Laboratory Improvement Amendments CLIA as qualified to perform high complexity clinical laboratory testing.    * 30,000 COLONIES/mL ESCHERICHIA COLI  Culture, blood (Routine x 2)     Status: None (Preliminary result)   Collection Time: 10/12/24  4:08 PM   Specimen: BLOOD  Result Value Ref Range Status   Specimen Description   Final    BLOOD RIGHT ANTECUBITAL Performed at [facility], [address] [place], [place] [address]    Special Requests   Final    BOTTLES DRAWN AEROBIC AND ANAEROBIC Blood Culture adequate volume Performed at [facility], [address] [place], [place] [address]    Culture   Final    NO GROWTH 2 DAYS Performed at [facility], [address], [place], [place] [address]    Report Status PENDING  Incomplete  Resp panel by RT-PCR (RSV, Flu A&B, Covid) Anterior Nasal Swab     Status: None   Collection Time: 10/12/24  4:54 PM   Specimen: Anterior Nasal Swab  Result Value Ref Range Status   SARS Coronavirus 2 by RT PCR NEGATIVE NEGATIVE Final    Comment: (NOTE) SARS-CoV-2 target nucleic acids are NOT DETECTED.  The SARS-CoV-2 RNA is generally detectable in upper respiratory specimens during the acute phase of infection. The lowest concentration of SARS-CoV-2 viral copies this assay can detect is 138 copies/mL. A negative result does not preclude SARS-Cov-2 infection and should not be used as the sole basis for treatment or other patient management decisions. A negative result may occur with  improper specimen  collection/handling, submission of specimen other than nasopharyngeal swab, presence of viral mutation(s) within the areas targeted by this assay, and inadequate number of viral copies(<138 copies/mL). A negative result must be combined with clinical observations, patient history, and epidemiological information. The expected result is Negative.  Fact Sheet for Patients:  bloggercourse.com  Fact Sheet for Healthcare Providers:  seriousbroker.it  This test is no t yet approved or cleared by the [place]  FDA and  has been authorized for detection and/or diagnosis of SARS-CoV-2 by FDA under an Emergency Use Authorization (EUA). This EUA will remain  in effect (meaning this test can be used) for the duration of the COVID-19 declaration under Section 564(b)(1) of the Act, 21 U.S.C.section 360bbb-3(b)(1), unless the authorization is terminated  or revoked sooner.       Influenza A by PCR NEGATIVE NEGATIVE Final   Influenza B by PCR NEGATIVE NEGATIVE Final    Comment: (NOTE) The Xpert Xpress SARS-CoV-2/FLU/RSV plus assay is intended as an aid in the diagnosis of influenza from Nasopharyngeal swab specimens and should not be used as a sole basis for treatment. Nasal washings and aspirates are unacceptable for Xpert Xpress SARS-CoV-2/FLU/RSV testing.  Fact Sheet for Patients: bloggercourse.com  Fact Sheet for Healthcare Providers: seriousbroker.it  This test is not yet approved or cleared by the [place]  FDA and has been authorized for detection and/or diagnosis of SARS-CoV-2 by FDA under an Emergency Use Authorization (EUA). This EUA will remain in effect (meaning this test can be used) for the duration of the COVID-19 declaration under Section 564(b)(1) of the Act, 21 U.S.C. section 360bbb-3(b)(1), unless the authorization is terminated or revoked.     Resp Syncytial  Virus by PCR NEGATIVE NEGATIVE Final    Comment: (NOTE) Fact Sheet for Patients: bloggercourse.com  Fact Sheet for Healthcare Providers: seriousbroker.it  This test is not yet approved or cleared by the [place]  FDA and has been authorized for detection and/or diagnosis of SARS-CoV-2 by FDA under an Emergency Use Authorization (EUA). This EUA will remain in effect (meaning this test can be used) for the duration of the COVID-19 declaration under Section 564(b)(1) of the Act, 21 U.S.C. section 360bbb-3(b)(1), unless the authorization is terminated or revoked.  Performed at [facility], [address], [place], [place] [address]          Radiology Studies: No results found.       Scheduled Meds:  azithromycin   500 mg Oral Daily   [START ON 10/16/2024] cephALEXin   500 mg Oral Q8H   Chlorhexidine  Gluconate Cloth  6 each Topical Daily   gabapentin   400 mg Oral BID   insulin  aspart  0-5 Units Subcutaneous QHS   insulin  aspart  0-9 Units Subcutaneous TID WC   insulin  glargine  10 Units Subcutaneous Daily   ipratropium-albuterol   3 mL Nebulization TID   linaclotide   145 mcg Oral QAC breakfast   midodrine   5 mg Oral TID WC   pantoprazole   40 mg Oral Daily   polyethylene glycol  17 g Oral BID   QUEtiapine   800 mg Oral QHS   rivaroxaban   10 mg Oral Daily   rosuvastatin   40 mg Oral Daily   senna  1 tablet Oral BID   sodium chloride  flush  10-40 mL Intracatheter Q12H   sodium chloride  flush  10-40 mL Intracatheter Q12H   sodium chloride  flush  3 mL Intravenous Q12H   Continuous Infusions:     LOS: 2 days      Renato Applebaum, MD [facility]   "

## 2024-10-15 NOTE — Plan of Care (Signed)

## 2024-10-15 NOTE — Inpatient Diabetes Management (Signed)
 Inpatient Diabetes Program Recommendations  AACE/ADA: New Consensus Statement on Inpatient Glycemic Control (2015)  Target Ranges:  Prepandial:   less than 140 mg/dL      Peak postprandial:   less than 180 mg/dL (1-2 hours)      Critically ill patients:  140 - 180 mg/dL   Lab Results  Component Value Date   GLUCAP 319 (H) 10/15/2024   HGBA1C 9.3 05/02/2023    Review of Glycemic Control  Latest Reference Range & Units 10/14/24 16:18 10/14/24 20:53 10/14/24 23:40 10/15/24 03:56 10/15/24 07:37 10/15/24 11:17 10/15/24 13:04  Glucose-Capillary 70 - 99 mg/dL 703 (H) 678 (H) 744 (H) 291 (H) 282 (H) 412 (H) 319 (H)   Diabetes history: DM 2 Outpatient Diabetes medications:  Omnipod insulin  pump- Total basal=10.8 units/hr Sensitivity: 50 mg/dL CHO- 2 units with each meal-  Uses Auto mode Current orders for Inpatient glycemic control:  Novolog  0-9 units tid with meals and HS Lantus  10 units daily  Inpatient Diabetes Program Recommendations:   Note that insulin  pump stopped due to hypoglycemia on 10/13/24/  Steroids stopped.  Agree with the addition of Lantus  10 units daily.   If eating, may also consider adding Novolog  meal coverage 2 units tid with meals (hold if patient eats less than 50% or NPO).   Thanks,  Randall Bullocks, RN, BC-ADM Inpatient Diabetes Coordinator Pager [phone]  (8a-5p)

## 2024-10-15 NOTE — Progress Notes (Signed)
 Thankfully, her MRI of the brain was negative.  I am happy about this.  She is having problems with hypotension.  I know that when she was in the office on Friday she was incredibly dehydrated.  She had a lot of fluids over the weekend.  She is on midodrine  now.  Her blood pressure is doing better.  She looks better.  Again, she smokes quite a bit.  I think this is part of the problem.  She really needs to stop smoking.  This will help her pulmonary function.  Her appetite is good.  She has had no nausea or vomiting.  Her blood sugars have been incredibly high.  I think this is from steroid use.  Her electrolytes all look pretty good.  Her CBC shows white cell count of 7.9.  Hemoglobin 9.4.  Platelet count 72,000.  There has been no diarrhea.  She has had no rashes.  There has been no leg swelling.     I think physical therapy is working with her.  I think is still a little bit dizziness.  Again I am not sure as to why that would be outside of the blood sugars being on the high side.  Her vital signs are temperature 98.1.  Pulse 96.  Blood pressure 126/70.  Her head and neck exam shows no ocular or oral lesions.  Oral mucosa is moist.  Neck is supple with no adenopathy.  Lungs sound a lot better.  She has better air movement bilaterally.  I do not hear any wheezing.  Again she has good air movement.  Cardiac exam regular rate and rhythm.  Abdomen is soft.  Bowel sounds are present.  There is no fluid wave.  Neurological exam shows no focal neurological deficits.  She does have there is small nodules on the lower sternum.  We really need to get these biopsied to see what they are.  It would be nice if this can be done with her in the hospital.  I know that she is on blood thinner.  Maybe, she will be able to go home in a day or so.  Her blood sugars need to be adjusted.  Again I know she is on steroids.  Maybe she can come down off the steroids.  I do appreciate the great care she is getting  from everybody on 4 W.    Jeralyn Crease, MD  Ariana Herrera 2:14

## 2024-10-15 NOTE — Discharge Summary (Signed)
 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  Also with lightheadedness and multiple falls.  Reportedly had some loose stool before arrival.  At the cancer center her blood pressure was 76/44, she was given 500 mL IV fluids, Ativan  and sent to the ER. In the emergency room afebrile.  On room air.  Blood pressure 85/66.  Creatinine 1.56, WC count 3.3.  Lactic acid was normal.  RVP panel negative.  Head CT was normal.  CT chest abdomen pelvis with a right upper lobe nodule increased in size left upper lobe nodule, new atelectasis or pneumonia in the lingula, faint ground glass opacities in bilateral lungs, large amount of stool throughout the colon.  Given IV fluids, treated with antibiotics for presumed pneumonia and admitted to the hospital. Foley catheter was placed due to retaining urine. Patient was found with significant orthostatic symptoms that improved with multiple fluid boluses and now on midodrine .  Clinically improved.  Catheter is out.  Able to go home today.   Left lower lobe pneumonia: bacterial pneumonia, on Rocephin  and azithromycin .  Will change to oral antibiotics to complete 7 days of therapy.  Can tolerate Rocephin  and Keflex . Patient will continue chest physiotherapy and incentive  spirometry at home. Patient on room air now. Cultures are negative. Steroids discontinued.   AKI, urinary retention: Treated with IV fluids.  Normalized.  Able to have successful voiding trial.   Acute UTI present on admission: Growing strep.  Treated with Rocephin .  Will continue Keflex  for 4 more days.   Hypovolemic hypotension/significant orthostatics: Treated with IV fluids.  Orthostatics improved today.  Able to mobilize without getting dizziness or lightheadedness.  Discontinue metoprolol . Midodrine  5 mg 3 times daily.   Breast cancer, recurrent with metastatic colon patient on Talazoparib that is now on hold now.  Oncology following.   Acute metabolic encephalopathy: Likely due to #1.  MRI of the brain without evidence of acute findings or tumors. Mental status improved.  Normalized.   Type 2 diabetes, on insulin  pump with hypoglycemia: Oral intake has improved.  She was given subcu insulin  before discharge.  She will go home and start her home insulin  pump with settings.   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   Sleep apnea: On CPAP.  Using at night.   Anxiety: On Seroquel    Smoker: Counseled to quit.   Constipation: MiraLAX  twice daily, Senokot twice daily, 1 dose of Dulcolax suppository today. Patient is already on Amitiza  and recently started on Linzess  that she will continue.  She will take additional dose of MiraLAX  to make regular bowel movements.    Patient was able to move around without getting dizziness lightheadedness.  She will take all precautions for orthostatic drop in blood pressure and prevent fall.  Oncology to schedule follow-up.  Discharge Diagnoses:  Principal Problem:   Pneumonia Active Problems:   Breast cancer (HCC)   Bipolar  disease, chronic (HCC)   Type 2 diabetes mellitus with hyperglycemia, with long-term current use of insulin  (HCC)   Moderate persistent asthma without complication   History of internal jugular thrombosis   Coronary  artery disease involving native coronary artery of native heart without angina pectoris   Acute metabolic encephalopathy   Sleep apnea   Pancytopenia (HCC)   AKI (acute kidney injury)   Constipation    Discharge Instructions  Discharge Instructions     Diet Carb Modified   Complete by: As directed    Increase activity slowly   Complete by: As directed       Allergies as of 10/15/2024       Reactions   Lithium Other (See Comments)   Spinal fluid built up in brain   Dulaglutide Nausea And Vomiting, Other (See Comments)   TRULICITY   Nitrofuran Derivatives Itching   Nitrofurantoin  Mono- MCR   Penicillins Other (See Comments)   UNKNOWN CHILDHOOD REACTION        Medication List     STOP taking these medications    lidocaine -prilocaine  cream Commonly known as: EMLA    metoprolol  succinate 25 MG 24 hr tablet Commonly known as: TOPROL -XL       TAKE these medications    Accu-Chek Guide test strip Generic drug: glucose blood 3 (three) times daily.   albuterol  1.25 MG/3ML nebulizer solution Commonly known as: ACCUNEB  Take 1 ampule by nebulization every 6 (six) hours as needed for wheezing or shortness of breath.   albuterol  108 (90 Base) MCG/ACT inhaler Commonly known as: VENTOLIN  HFA Inhale 2 puffs into the lungs every 6 (six) hours as needed for wheezing or shortness of breath.   azithromycin  250 MG tablet Commonly known as: ZITHROMAX  Take 1 tablet (250 mg total) by mouth daily for 2 days.   cephALEXin  500 MG capsule Commonly known as: KEFLEX  Take 1 capsule (500 mg total) by mouth every 8 (eight) hours for 4 days. Start taking on: October 16, 2024   Dexcom G7 Sensor Misc Change sensor every 10 days   fenofibrate  145 MG tablet Commonly known as: TRICOR  Take 145 mg by mouth daily.   gabapentin  400 MG capsule Commonly known as: Neurontin  Take 1 capsule (400 mg total) by mouth 3 (three) times daily. What changed: when to take this    HYDROcodone -acetaminophen  7.5-325 MG tablet Commonly known as: NORCO Take 1 tablet by mouth every 6 (six) hours as needed for moderate pain (pain score 4-6).   Linzess  290 MCG Caps capsule Generic drug: linaclotide  TAKE 1 CAPSULE BY MOUTH DAILY BEFORE BREAKFAST. What changed: See the new instructions.   lubiprostone  24 MCG capsule Commonly known as: AMITIZA  Take 1 capsule (24 mcg total) by mouth 2 (two) times daily with a meal.   metFORMIN  1000 MG tablet Commonly known as: GLUCOPHAGE  Take 1 tablet (1,000 mg total) by mouth in the morning and in the evening with meals.   midodrine  5 MG tablet Commonly known as: PROAMATINE  Take 1 tablet (5 mg total) by mouth 3 (three) times daily with meals.   nitroGLYCERIN  0.4 MG SL tablet Commonly known as: NITROSTAT  Place 1 tablet (0.4 mg total) under the tongue every 5 (five) minutes as needed for chest pain.   NovoLOG  100 UNIT/ML injection Generic drug: insulin  aspart Inject into the skin as directed.   insulin  aspart 100 UNIT/ML injection Commonly known as: novoLOG  Use as directed via insulin  pump. Total daily dose 100 units.   OLANZapine  10 MG tablet Commonly known  as: ZYPREXA  TAKE 1 TABLET BY MOUTH EVERYDAY AT BEDTIME What changed: See the new instructions.   Omnipod 5 DexG7G6 Pods Gen 5 Misc Apply 1 pod every 3 days for insulin  administration.   ondansetron  8 MG disintegrating tablet Commonly known as: ZOFRAN -ODT Take 1 tablet (8 mg total) by mouth every 8 (eight) hours as needed for nausea or vomiting.   oxybutynin  5 MG 24 hr tablet Commonly known as: DITROPAN -XL TAKE 1 TABLET BY MOUTH EVERYDAY AT BEDTIME What changed: See the new instructions.   pantoprazole  40 MG tablet Commonly known as: PROTONIX  Take 1 tablet (40 mg total) by mouth daily.   PEG 3350  17 g Pack Take 17 g by mouth daily as needed. What changed: reasons to take this   prochlorperazine  10 MG tablet Commonly known as: COMPAZINE  Take 1 tablet (10 mg  total) by mouth every 6 (six) hours as needed for nausea or vomiting (Zofran  resumes 12/16).   QUEtiapine  400 MG tablet Commonly known as: SEROQUEL  Take 2 tablets (800 mg total) by mouth at bedtime.   rivaroxaban  10 MG Tabs tablet Commonly known as: XARELTO  Take 1 tablet (10 mg total) by mouth daily.   rosuvastatin  40 MG tablet Commonly known as: CRESTOR  Take 1 tablet (40 mg total) by mouth daily.   senna 8.6 MG Tabs tablet Commonly known as: SENOKOT Take 1 tablet (8.6 mg total) by mouth 2 (two) times daily.   talazoparib tosylate  0.35 MG capsule Commonly known as: Talzenna  Take 1 capsule (0.35 mg total) by mouth daily. Take for 21 days on, then off for 7 days. Repeat every 28 days.        Allergies[1]  Consultations: Oncology   Procedures/Studies: MR BRAIN W WO CONTRAST Result Date: 10/13/2024 EXAM: MRI BRAIN WITH AND WITHOUT CONTRAST 10/13/2024 09:48:11 AM TECHNIQUE: Multiplanar multisequence MRI of the head/brain was performed with and without the administration of intravenous contrast. 6 mL (gadobutrol  (GADAVIST ) 1 MMOL/ML injection 6 mL GADOBUTROL  1 MMOL/ML IV SOLN) was administered. COMPARISON: MRI of the head dated 03/02/2024. CLINICAL HISTORY: Breast cancer, invasive, stage IV, initial workup; She has been having some dizziness. She has had some episodes of falling. FINDINGS: BRAIN AND VENTRICLES: No acute infarct. No acute intracranial hemorrhage. No mass effect or midline shift. No hydrocephalus. A focal area of dural thickening is again seen, measuring 7 mm in diameter, which remains compatible with a meningioma. There are no findings suspicious for metastatic disease. There is no significant change from the previous study. The sella is unremarkable. Normal flow voids. ORBITS: The patient is status post bilateral lens replacement. No acute abnormality. SINUSES: Mild mucosal disease is present within the left maxillary sinus. No acute abnormality. BONES AND SOFT TISSUES:  Normal bone marrow signal and enhancement. No acute soft tissue abnormality. IMPRESSION: 1. No acute intracranial abnormality. 2. Stable 7 mm meningioma without significant interval change. 3. No findings suspicious for metastatic disease. 4. Mild left maxillary sinus mucosal disease. Electronically signed by: Evalene Coho MD 10/13/2024 10:48 AM EST RP Workstation: [id number]   CT CHEST ABDOMEN PELVIS W CONTRAST Result Date: 10/12/2024 EXAM: CT CHEST, ABDOMEN AND PELVIS WITH CONTRAST 10/12/2024 05:50:57 PM TECHNIQUE: CT of the chest, abdomen and pelvis was performed with the administration of intravenous contrast. Multiplanar reformatted images are provided for review. Automated exposure control, iterative reconstruction, and/or weight based adjustment of the mA/kV was utilized to reduce the radiation dose to as low as reasonably achievable. COMPARISON: Comparison date: 10/03/2024. CLINICAL HISTORY: Cough, n/v/d, hypotension, cancer hx. FINDINGS:  CHEST: MEDIASTINUM AND LYMPH NODES: Right chest port catheter tip ends in the SVC. There are atherosclerotic calcifications of the aorta. LUNGS AND PLEURA: New atelectasis/consolidation in the lingula. Post radiation changes are again seen in the anterior inferior left upper lobe. There is a 4 mm left upper lobe nodule on image 7/40 which has increased in size. There is some very faint ground glass opacities diffusely throughout both lungs. There is a new right upper lobe pulmonary nodule image 7/69 measuring 4 mm. ABDOMEN AND PELVIS: LIVER: The liver is unremarkable. GALLBLADDER AND BILE DUCTS: Gallbladder is surgically absent. SPLEEN: No acute abnormality. PANCREAS: No acute abnormality. ADRENAL GLANDS: No acute abnormality. KIDNEYS, URETERS AND BLADDER: Punctate calculi in the left kidney are unchanged. No hydronephrosis. No perinephric or periureteral stranding. Urinary bladder is unremarkable. GI AND BOWEL: There is a very large amount of stool throughout the  entire colon. There are some air fluid levels in the proximal colon. There are some relative areas of narrowing throughout the transverse colon which may represent peristalsis, but stricture or mass not excluded. There is no bowel obstruction. Appendix appears normal. There are also scattered air fluid levels throughout distended distal ileal loops. Jejunum and stomach are within normal limits. REPRODUCTIVE ORGANS: No acute abnormality. PERITONEUM AND RETROPERITONEUM: No ascites. No free air. VASCULATURE: Aorta is normal in caliber, but there are atherosclerotic calcifications of the aorta and iliac arteries. ABDOMINAL AND PELVIS LYMPH NODES: No lymphadenopathy. BONES AND SOFT TISSUES: There is wall thickening throughout the esophagus. There is skin thickening of the left breast with diffuse edema throughout the left breast. Surgical clips and calcifications are again noted in the left breast. Medication injection sites are noted in the subcutaneous tissues of the anterior abdominal wall. IMPRESSION: 1. New 4 mm right upper lobe pulmonary nodule and increased size of the 4 mm left upper lobe nodule, concerning for metastatic disease. 2. New atelectasis/consolidation in the lingula. 3. Very faint ground glass opacities diffusely throughout both lungs. Findings may be infectious/inflammatory or related to mild edema. 4. Wall thickening throughout the esophagus. Correlate clinically for esophagitis. 5. Very large amount of stool throughout the entire colon with some air-fluid levels in the proximal colon, without bowel obstruction. Findings are concerning for colonic ileus and/or constipation. 6. Similar left breast skin thickening and edema with surgical clips and calcifications. 7. Left renal calculi. Electronically signed by: Greig Pique MD 10/12/2024 06:32 PM EST RP Workstation: [id number]   CT Head Wo Contrast Result Date: 10/12/2024 CLINICAL DATA:  Altered mental status. Weakness and confusion. Active  chemotherapy and radiation for cancer. EXAM: CT HEAD WITHOUT CONTRAST TECHNIQUE: Contiguous axial images were obtained from the base of the skull through the vertex without intravenous contrast. RADIATION DOSE REDUCTION: This exam was performed according to the departmental dose-optimization program which includes automated exposure control, adjustment of the mA and/or kV according to patient size and/or use of iterative reconstruction technique. COMPARISON:  Brain MRI 03/02/2024 FINDINGS: Brain: No acute intracranial hemorrhage. No evidence of acute ischemia. No hydrocephalus, the basilar cisterns are patent. The subcentimeter dural-based lesion overlying the left middle frontal gyrus on prior MRI has no CT correlate. No evidence of intracranial mass or edema. No midline shift. No subdural or extra-axial collection. Vascular: No hyperdense vessel or unexpected calcification. Skull: No fracture or focal lesion. Sinuses/Orbits: Trace mucosal thickening involving right side of sphenoid sinus. Occasional mucosal thickening of ethmoid air cells. No mastoid effusion. Bilateral lens resection. Other: None. IMPRESSION: No acute intracranial abnormality. Electronically Signed  By: Andrea Gasman M.D.   On: 10/12/2024 18:26   CT CHEST ABDOMEN PELVIS W CONTRAST Result Date: 10/04/2024 CLINICAL DATA:  History of left breast cancer, pain in left flank, new breast lumps * Tracking Code: BO * EXAM: CT CHEST, ABDOMEN, AND PELVIS WITH CONTRAST TECHNIQUE: Multidetector CT imaging of the chest, abdomen and pelvis was performed following the standard protocol during bolus administration of intravenous contrast. RADIATION DOSE REDUCTION: This exam was performed according to the departmental dose-optimization program which includes automated exposure control, adjustment of the mA and/or kV according to patient size and/or use of iterative reconstruction technique. CONTRAST:  OMNIPAQUE  IOHEXOL  300 MG/ML  SOLN COMPARISON:   10/06/2023 FINDINGS: CT CHEST FINDINGS Cardiovascular: Right chest port catheter. Aortic atherosclerosis. Normal heart size. Left and right coronary artery calcifications. New small pericardial effusion. Effusion. Mediastinum/Nodes: No enlarged mediastinal, hilar, or axillary lymph nodes. Thyroid  gland, trachea, and esophagus demonstrate no significant findings. Lungs/Pleura: Subpleural radiation fibrosis in the anterior left lung. Mild diffuse bilateral bronchial wall thickening. New nodule in the perihilar lingula measuring 0.6 cm (series 302, image 46). No pleural effusion or pneumothorax. Musculoskeletal: Skin thickening of the left breast, increased compared to prior examination. Similar appearance of lumpectomy site of the medial left breast with increased dystrophic calcification (series 301, image 24). No acute osseous findings. CT ABDOMEN PELVIS FINDINGS Hepatobiliary: No focal liver abnormality is seen. Status post cholecystectomy. Mild postoperative biliary dilatation. Pancreas: Unremarkable. No pancreatic ductal dilatation or surrounding inflammatory changes. Spleen: Normal in size without significant abnormality. Adrenals/Urinary Tract: Adrenal glands are unremarkable. Small nonobstructive left renal calculi and or renal vascular calcifications. No right-sided calculi, ureteral calculi, or hydronephrosis. Bladder is unremarkable. Stomach/Bowel: Stomach is within normal limits. Appendix appears normal. No evidence of bowel wall thickening, distention, or inflammatory changes. Large burden of stool in the colon. Vascular/Lymphatic: Aortic atherosclerosis. No enlarged abdominal or pelvic lymph nodes. Reproductive: No mass or other abnormality. Other: No abdominal wall hernia or abnormality. No ascites. Musculoskeletal: No acute osseous findings. IMPRESSION: 1. Skin thickening of the left breast, increased compared to prior examination. Similar appearance of lumpectomy site of the medial left breast with  increased dystrophic calcification. Increasing skin thickening may be related to treatment cutaneous involvement disease is not excluded. Correlate with physical examination and consider punch biopsy. 2. New pulmonary nodule in the perihilar lingula measuring 0.6 cm, nonspecific and warranting careful attention on follow-up. 3. No evidence of lymphadenopathy or metastatic disease in the abdomen or pelvis. 4. Small nonobstructive left renal calculi and or renal vascular calcifications. No right-sided calculi, ureteral calculi, or hydronephrosis. 5. Coronary artery disease. Aortic Atherosclerosis (ICD10-I70.0). Electronically Signed   By: Marolyn JONETTA Jaksch M.D.   On: 10/04/2024 06:05   (Echo, Carotid, EGD, Colonoscopy, ERCP)    Subjective: Patient seen and examined in the morning rounds.  Today denies any complaints.  Appetite has improved.  Blood sugars elevated.  Later in the afternoon, patient was able to walk around without dizziness or lightheadedness.  She was successfully able to urinate after removal of Foley catheter.  Comfortable with plan to go home.   Discharge Exam: Vitals:   10/15/24 0908 10/15/24 1145  BP:    Pulse:    Resp:    Temp:    SpO2: 94% 94%   Vitals:   10/14/24 2243 10/15/24 0525 10/15/24 0908 10/15/24 1145  BP:  126/70    Pulse: 91 93    Resp:      Temp:  98.1 F (36.7 C)  TempSrc:  Oral    SpO2:  96% 94% 94%  Weight:      Height:        General: Pt is alert, awake, not in acute distress Cardiovascular: RRR, S1/S2 +, no rubs, no gallops, Port-A-Cath right chest wall. Respiratory: CTA bilaterally, no wheezing, no rhonchi Abdominal: Soft, NT, ND, bowel sounds + Extremities: no edema, no cyanosis    The results of significant diagnostics from this hospitalization (including imaging, microbiology, ancillary and laboratory) are listed below for reference.     Microbiology: Recent Results (from the past 240 hours)  Culture, blood (Routine x 2)     Status:  None (Preliminary result)   Collection Time: 10/12/24  4:03 PM   Specimen: BLOOD  Result Value Ref Range Status   Specimen Description   Final    BLOOD LEFT ANTECUBITAL Performed at [facility], [address] [place], [place] [address]    Special Requests   Final    BOTTLES DRAWN AEROBIC AND ANAEROBIC Blood Culture adequate volume Performed at [facility], [address] [place], [place] [address]    Culture   Final    NO GROWTH 3 DAYS Performed at [facility], [address] [place], [place] [address]    Report Status PENDING  Incomplete  Urine Culture     Status: Abnormal   Collection Time: 10/12/24  4:06 PM   Specimen: Urine, Random  Result Value Ref Range Status   Specimen Description   Final    URINE, RANDOM Performed at [facility], [address] [place], [place] [address]    Special Requests   Final    NONE Reflexed from [id number] Performed at [facility], [address] [place], [place] [address]    Culture (A)  Final    >=100,000 COLONIES/mL GROUP B STREP(S.AGALACTIAE)ISOLATED TESTING AGAINST S. AGALACTIAE NOT ROUTINELY PERFORMED DUE TO PREDICTABILITY OF AMP/PEN/VAN SUSCEPTIBILITY. 30,000 COLONIES/mL ESCHERICHIA COLI    Report Status 10/15/2024 FINAL  Final   Organism ID, Bacteria ESCHERICHIA COLI (A)  Final      Susceptibility   Escherichia coli - MIC*    AMPICILLIN >=32 RESISTANT Resistant     CEFAZOLIN (URINE) Value in next row Sensitive      2 SENSITIVEThis is a modified FDA-approved test that has been validated and its performance characteristics determined by the reporting laboratory.  This laboratory is certified under the Clinical Laboratory Improvement Amendments CLIA as qualified to perform high complexity clinical laboratory testing.    CEFEPIME  Value in next row Sensitive      2 SENSITIVEThis is a modified FDA-approved test that has been validated and its performance characteristics determined by  the reporting laboratory.  This laboratory is certified under the Clinical Laboratory Improvement Amendments CLIA as qualified to perform high complexity clinical laboratory testing.    ERTAPENEM Value in next row Sensitive      2 SENSITIVEThis is a modified FDA-approved test that has been validated and its performance characteristics determined by the reporting laboratory.  This laboratory is certified under the Clinical Laboratory Improvement Amendments CLIA as qualified to perform high complexity clinical laboratory testing.    CEFTRIAXONE  Value in next row Sensitive      2 SENSITIVEThis is a modified FDA-approved test that has been validated and its performance characteristics determined by the reporting laboratory.  This laboratory is certified under the Clinical Laboratory Improvement Amendments CLIA as qualified to perform  high complexity clinical laboratory testing.    CIPROFLOXACIN Value in next row Sensitive      2 SENSITIVEThis is a modified FDA-approved test that has been validated and its performance characteristics determined by the reporting laboratory.  This laboratory is certified under the Clinical Laboratory Improvement Amendments CLIA as qualified to perform high complexity clinical laboratory testing.    GENTAMICIN Value in next row Sensitive      2 SENSITIVEThis is a modified FDA-approved test that has been validated and its performance characteristics determined by the reporting laboratory.  This laboratory is certified under the Clinical Laboratory Improvement Amendments CLIA as qualified to perform high complexity clinical laboratory testing.    NITROFURANTOIN  Value in next row Intermediate      2 SENSITIVEThis is a modified FDA-approved test that has been validated and its performance characteristics determined by the reporting laboratory.  This laboratory is certified under the Clinical Laboratory Improvement Amendments CLIA as qualified to perform high complexity clinical  laboratory testing.    TRIMETH /SULFA  Value in next row Resistant      2 SENSITIVEThis is a modified FDA-approved test that has been validated and its performance characteristics determined by the reporting laboratory.  This laboratory is certified under the Clinical Laboratory Improvement Amendments CLIA as qualified to perform high complexity clinical laboratory testing.    AMPICILLIN/SULBACTAM Value in next row Resistant      2 SENSITIVEThis is a modified FDA-approved test that has been validated and its performance characteristics determined by the reporting laboratory.  This laboratory is certified under the Clinical Laboratory Improvement Amendments CLIA as qualified to perform high complexity clinical laboratory testing.    PIP/TAZO Value in next row Sensitive      8 SENSITIVEThis is a modified FDA-approved test that has been validated and its performance characteristics determined by the reporting laboratory.  This laboratory is certified under the Clinical Laboratory Improvement Amendments CLIA as qualified to perform high complexity clinical laboratory testing.    MEROPENEM Value in next row Sensitive      8 SENSITIVEThis is a modified FDA-approved test that has been validated and its performance characteristics determined by the reporting laboratory.  This laboratory is certified under the Clinical Laboratory Improvement Amendments CLIA as qualified to perform high complexity clinical laboratory testing.    * 30,000 COLONIES/mL ESCHERICHIA COLI  Culture, blood (Routine x 2)     Status: None (Preliminary result)   Collection Time: 10/12/24  4:08 PM   Specimen: BLOOD  Result Value Ref Range Status   Specimen Description   Final    BLOOD RIGHT ANTECUBITAL Performed at [facility], [address] [place], [place] [address]    Special Requests   Final    BOTTLES DRAWN AEROBIC AND ANAEROBIC Blood Culture adequate volume Performed at [facility], [address]  [place], [place] [address]    Culture   Final    NO GROWTH 3 DAYS Performed at [facility], [address], [place], [place] [address]    Report Status PENDING  Incomplete  Resp panel by RT-PCR (RSV, Flu A&B, Covid) Anterior Nasal Swab     Status: None   Collection Time: 10/12/24  4:54 PM   Specimen: Anterior Nasal Swab  Result Value Ref Range Status   SARS Coronavirus 2 by RT PCR NEGATIVE NEGATIVE Final    Comment: (NOTE) SARS-CoV-2 target nucleic acids are NOT DETECTED.  The SARS-CoV-2 RNA is generally detectable in upper respiratory specimens  during the acute phase of infection. The lowest concentration of SARS-CoV-2 viral copies this assay can detect is 138 copies/mL. A negative result does not preclude SARS-Cov-2 infection and should not be used as the sole basis for treatment or other patient management decisions. A negative result may occur with  improper specimen collection/handling, submission of specimen other than nasopharyngeal swab, presence of viral mutation(s) within the areas targeted by this assay, and inadequate number of viral copies(<138 copies/mL). A negative result must be combined with clinical observations, patient history, and epidemiological information. The expected result is Negative.  Fact Sheet for Patients:  bloggercourse.com  Fact Sheet for Healthcare Providers:  seriousbroker.it  This test is no t yet approved or cleared by the [place]  FDA and  has been authorized for detection and/or diagnosis of SARS-CoV-2 by FDA under an Emergency Use Authorization (EUA). This EUA will remain  in effect (meaning this test can be used) for the duration of the COVID-19 declaration under Section 564(b)(1) of the Act, 21 U.S.C.section 360bbb-3(b)(1), unless the authorization is terminated  or revoked sooner.       Influenza A by PCR NEGATIVE NEGATIVE Final   Influenza B by PCR NEGATIVE NEGATIVE Final     Comment: (NOTE) The Xpert Xpress SARS-CoV-2/FLU/RSV plus assay is intended as an aid in the diagnosis of influenza from Nasopharyngeal swab specimens and should not be used as a sole basis for treatment. Nasal washings and aspirates are unacceptable for Xpert Xpress SARS-CoV-2/FLU/RSV testing.  Fact Sheet for Patients: bloggercourse.com  Fact Sheet for Healthcare Providers: seriousbroker.it  This test is not yet approved or cleared by the [place]  FDA and has been authorized for detection and/or diagnosis of SARS-CoV-2 by FDA under an Emergency Use Authorization (EUA). This EUA will remain in effect (meaning this test can be used) for the duration of the COVID-19 declaration under Section 564(b)(1) of the Act, 21 U.S.C. section 360bbb-3(b)(1), unless the authorization is terminated or revoked.     Resp Syncytial Virus by PCR NEGATIVE NEGATIVE Final    Comment: (NOTE) Fact Sheet for Patients: bloggercourse.com  Fact Sheet for Healthcare Providers: seriousbroker.it  This test is not yet approved or cleared by the [place]  FDA and has been authorized for detection and/or diagnosis of SARS-CoV-2 by FDA under an Emergency Use Authorization (EUA). This EUA will remain in effect (meaning this test can be used) for the duration of the COVID-19 declaration under Section 564(b)(1) of the Act, 21 U.S.C. section 360bbb-3(b)(1), unless the authorization is terminated or revoked.  Performed at [facility], [address] [place], [place] [address]      Labs: BNP (last 3 results) No results for input(s): BNP in the last 8760 hours. Basic Metabolic Panel: Recent Labs  Lab 10/12/24 1011 10/12/24 1654 10/13/24 0037 10/14/24 0337 10/15/24 0317  NA 133* 137 141 139 136  K 3.4* 3.3* 3.6 4.0 3.8  CL 95* 99 106 107 103  CO2 24 27 26 24 24   GLUCOSE 116* 68*  82 264* 295*  BUN 31* 26* 17 12 11   CREATININE 1.56* 1.26* 1.08* 0.94 0.91  CALCIUM  9.0 8.8* 8.3* 8.5* 9.1   Liver Function Tests: Recent Labs  Lab 10/12/24 1011 10/12/24 1654 10/14/24 0337 10/15/24 0317  AST 32 36 17 28  ALT 18 19 16 25   ALKPHOS 97 106 84 86  BILITOT 0.2 0.3 <0.2 <0.2  PROT 6.2* 6.2* 4.8* 5.4*  ALBUMIN 3.5 3.6 2.8* 2.9*   No results for  input(s): LIPASE, AMYLASE in the last 168 hours. Recent Labs  Lab 10/12/24 1654  AMMONIA <13   CBC: Recent Labs  Lab 10/12/24 1011 10/12/24 1654 10/13/24 0037 10/14/24 0337 10/15/24 0317  WBC 3.3* 3.0* 3.1* 4.6 7.9  NEUTROABS 2.2 1.8  --  4.1 7.5  HGB 11.1* 11.5* 9.8* 8.7* 9.4*  HCT 32.9* 34.2* 30.0* 26.1* 27.9*  MCV 108.2* 107.9* 111.5* 109.2* 107.7*  PLT 86* 88* 68* 70* 72*   Cardiac Enzymes: No results for input(s): CKTOTAL, CKMB, CKMBINDEX, TROPONINI in the last 168 hours. BNP: Invalid input(s): POCBNP CBG: Recent Labs  Lab 10/14/24 2340 10/15/24 0356 10/15/24 0737 10/15/24 1117 10/15/24 1304  GLUCAP 255* 291* 282* 412* 319*   D-Dimer No results for input(s): DDIMER in the last 72 hours. Hgb A1c No results for input(s): HGBA1C in the last 72 hours. Lipid Profile No results for input(s): CHOL, HDL, LDLCALC, TRIG, CHOLHDL, LDLDIRECT in the last 72 hours. Thyroid  function studies No results for input(s): TSH, T4TOTAL, T3FREE, THYROIDAB in the last 72 hours.  Invalid input(s): FREET3 Anemia work up No results for input(s): VITAMINB12, FOLATE, FERRITIN, TIBC, IRON, RETICCTPCT in the last 72 hours. Urinalysis    Component Value Date/Time   COLORURINE YELLOW 10/12/2024 1606   APPEARANCEUR CLOUDY (A) 10/12/2024 1606   LABSPEC 1.025 10/12/2024 1606   PHURINE 5.5 10/12/2024 1606   GLUCOSEU NEGATIVE 10/12/2024 1606   HGBUR TRACE (A) 10/12/2024 1606   BILIRUBINUR NEGATIVE 10/12/2024 1606   BILIRUBINUR neg 03/15/2024 1421   KETONESUR NEGATIVE  10/12/2024 1606   PROTEINUR 100 (A) 10/12/2024 1606   UROBILINOGEN 0.2 03/15/2024 1421   NITRITE NEGATIVE 10/12/2024 1606   LEUKOCYTESUR LARGE (A) 10/12/2024 1606   Sepsis Labs Recent Labs  Lab 10/12/24 1654 10/13/24 0037 10/14/24 0337 10/15/24 0317  WBC 3.0* 3.1* 4.6 7.9   Microbiology Recent Results (from the past 240 hours)  Culture, blood (Routine x 2)     Status: None (Preliminary result)   Collection Time: 10/12/24  4:03 PM   Specimen: BLOOD  Result Value Ref Range Status   Specimen Description   Final    BLOOD LEFT ANTECUBITAL Performed at [facility], [address] [place], [place] [address]    Special Requests   Final    BOTTLES DRAWN AEROBIC AND ANAEROBIC Blood Culture adequate volume Performed at [facility], [address] [place], [place] [address]    Culture   Final    NO GROWTH 3 DAYS Performed at [facility], [address] [place], [place] [address]    Report Status PENDING  Incomplete  Urine Culture     Status: Abnormal   Collection Time: 10/12/24  4:06 PM   Specimen: Urine, Random  Result Value Ref Range Status   Specimen Description   Final    URINE, RANDOM Performed at [facility], [address] [place], [place] [address]    Special Requests   Final    NONE Reflexed from [id number] Performed at [facility], [address] [place], [place] [address]    Culture (A)  Final    >=100,000 COLONIES/mL GROUP B STREP(S.AGALACTIAE)ISOLATED TESTING AGAINST S. AGALACTIAE NOT ROUTINELY PERFORMED DUE TO PREDICTABILITY OF AMP/PEN/VAN SUSCEPTIBILITY. 30,000 COLONIES/mL ESCHERICHIA COLI    Report Status 10/15/2024 FINAL  Final   Organism ID, Bacteria ESCHERICHIA COLI (A)  Final      Susceptibility   Escherichia coli - MIC*  AMPICILLIN >=32 RESISTANT Resistant     CEFAZOLIN (URINE) Value in next row Sensitive      2 SENSITIVEThis is a modified FDA-approved test that has been validated and its  performance characteristics determined by the reporting laboratory.  This laboratory is certified under the Clinical Laboratory Improvement Amendments CLIA as qualified to perform high complexity clinical laboratory testing.    CEFEPIME  Value in next row Sensitive      2 SENSITIVEThis is a modified FDA-approved test that has been validated and its performance characteristics determined by the reporting laboratory.  This laboratory is certified under the Clinical Laboratory Improvement Amendments CLIA as qualified to perform high complexity clinical laboratory testing.    ERTAPENEM Value in next row Sensitive      2 SENSITIVEThis is a modified FDA-approved test that has been validated and its performance characteristics determined by the reporting laboratory.  This laboratory is certified under the Clinical Laboratory Improvement Amendments CLIA as qualified to perform high complexity clinical laboratory testing.    CEFTRIAXONE  Value in next row Sensitive      2 SENSITIVEThis is a modified FDA-approved test that has been validated and its performance characteristics determined by the reporting laboratory.  This laboratory is certified under the Clinical Laboratory Improvement Amendments CLIA as qualified to perform high complexity clinical laboratory testing.    CIPROFLOXACIN Value in next row Sensitive      2 SENSITIVEThis is a modified FDA-approved test that has been validated and its performance characteristics determined by the reporting laboratory.  This laboratory is certified under the Clinical Laboratory Improvement Amendments CLIA as qualified to perform high complexity clinical laboratory testing.    GENTAMICIN Value in next row Sensitive      2 SENSITIVEThis is a modified FDA-approved test that has been validated and its performance characteristics determined by the reporting laboratory.  This laboratory is certified under the Clinical Laboratory Improvement Amendments CLIA as qualified to  perform high complexity clinical laboratory testing.    NITROFURANTOIN  Value in next row Intermediate      2 SENSITIVEThis is a modified FDA-approved test that has been validated and its performance characteristics determined by the reporting laboratory.  This laboratory is certified under the Clinical Laboratory Improvement Amendments CLIA as qualified to perform high complexity clinical laboratory testing.    TRIMETH /SULFA  Value in next row Resistant      2 SENSITIVEThis is a modified FDA-approved test that has been validated and its performance characteristics determined by the reporting laboratory.  This laboratory is certified under the Clinical Laboratory Improvement Amendments CLIA as qualified to perform high complexity clinical laboratory testing.    AMPICILLIN/SULBACTAM Value in next row Resistant      2 SENSITIVEThis is a modified FDA-approved test that has been validated and its performance characteristics determined by the reporting laboratory.  This laboratory is certified under the Clinical Laboratory Improvement Amendments CLIA as qualified to perform high complexity clinical laboratory testing.    PIP/TAZO Value in next row Sensitive      8 SENSITIVEThis is a modified FDA-approved test that has been validated and its performance characteristics determined by the reporting laboratory.  This laboratory is certified under the Clinical Laboratory Improvement Amendments CLIA as qualified to perform high complexity clinical laboratory testing.    MEROPENEM Value in next row Sensitive      8 SENSITIVEThis is a modified FDA-approved test that has been validated and its performance characteristics determined by the reporting laboratory.  This laboratory is certified under the Clinical  Laboratory Improvement Amendments CLIA as qualified to perform high complexity clinical laboratory testing.    * 30,000 COLONIES/mL ESCHERICHIA COLI  Culture, blood (Routine x 2)     Status: None (Preliminary  result)   Collection Time: 10/12/24  4:08 PM   Specimen: BLOOD  Result Value Ref Range Status   Specimen Description   Final    BLOOD RIGHT ANTECUBITAL Performed at [facility], [address] [place], [place] [address]    Special Requests   Final    BOTTLES DRAWN AEROBIC AND ANAEROBIC Blood Culture adequate volume Performed at [facility] [address] [place], [place] [address]    Culture   Final    NO GROWTH 3 DAYS Performed at [facility], [address] [place], [place] [address]    Report Status PENDING  Incomplete  Resp panel by RT-PCR (RSV, Flu A&B, Covid) Anterior Nasal Swab     Status: None   Collection Time: 10/12/24  4:54 PM   Specimen: Anterior Nasal Swab  Result Value Ref Range Status   SARS Coronavirus 2 by RT PCR NEGATIVE NEGATIVE Final    Comment: (NOTE) SARS-CoV-2 target nucleic acids are NOT DETECTED.  The SARS-CoV-2 RNA is generally detectable in upper respiratory specimens during the acute phase of infection. The lowest concentration of SARS-CoV-2 viral copies this assay can detect is 138 copies/mL. A negative result does not preclude SARS-Cov-2 infection and should not be used as the sole basis for treatment or other patient management decisions. A negative result may occur with  improper specimen collection/handling, submission of specimen other than nasopharyngeal swab, presence of viral mutation(s) within the areas targeted by this assay, and inadequate number of viral copies(<138 copies/mL). A negative result must be combined with clinical observations, patient history, and epidemiological information. The expected result is Negative.  Fact Sheet for Patients:  bloggercourse.com  Fact Sheet for Healthcare Providers:  seriousbroker.it  This test is no t yet approved or cleared by the [place]  FDA and  has been authorized for detection and/or diagnosis of  SARS-CoV-2 by FDA under an Emergency Use Authorization (EUA). This EUA will remain  in effect (meaning this test can be used) for the duration of the COVID-19 declaration under Section 564(b)(1) of the Act, 21 U.S.C.section 360bbb-3(b)(1), unless the authorization is terminated  or revoked sooner.       Influenza A by PCR NEGATIVE NEGATIVE Final   Influenza B by PCR NEGATIVE NEGATIVE Final    Comment: (NOTE) The Xpert Xpress SARS-CoV-2/FLU/RSV plus assay is intended as an aid in the diagnosis of influenza from Nasopharyngeal swab specimens and should not be used as a sole basis for treatment. Nasal washings and aspirates are unacceptable for Xpert Xpress SARS-CoV-2/FLU/RSV testing.  Fact Sheet for Patients: bloggercourse.com  Fact Sheet for Healthcare Providers: seriousbroker.it  This test is not yet approved or cleared by the [place]  FDA and has been authorized for detection and/or diagnosis of SARS-CoV-2 by FDA under an Emergency Use Authorization (EUA). This EUA will remain in effect (meaning this test can be used) for the duration of the COVID-19 declaration under Section 564(b)(1) of the Act, 21 U.S.C. section 360bbb-3(b)(1), unless the authorization is terminated or revoked.     Resp Syncytial Virus by PCR NEGATIVE NEGATIVE Final    Comment: (NOTE) Fact Sheet for Patients: bloggercourse.com  Fact Sheet for Healthcare Providers: seriousbroker.it  This test is not yet approved or cleared by the [place]   FDA and has been authorized for detection and/or diagnosis of SARS-CoV-2 by FDA under an Emergency Use Authorization (EUA). This EUA will remain in effect (meaning this test can be used) for the duration of the COVID-19 declaration under Section 564(b)(1) of the Act, 21 U.S.C. section 360bbb-3(b)(1), unless the authorization is terminated  or revoked.  Performed at [facility], [address] [place], [place] [address]      Time coordinating discharge: 35 minutes  SIGNED:   Renato Applebaum, MD  [facility] 10/15/2024, 1:38 PM     [1]  Allergies Allergen Reactions   Lithium Other (See Comments)    Spinal fluid built up in brain   Dulaglutide Nausea And Vomiting and Other (See Comments)    TRULICITY   Nitrofuran Derivatives Itching    Nitrofurantoin  Mono- MCR   Penicillins Other (See Comments)    UNKNOWN CHILDHOOD REACTION

## 2024-10-15 NOTE — Progress Notes (Signed)
 Discharge medications delivered to patient at the bedside.

## 2024-10-15 NOTE — Progress Notes (Signed)
 Mobility Specialist Progress Note:   10/15/24 1127  Mobility  Activity Ambulated with assistance  Level of Assistance Contact guard assist, steadying assist  Assistive Device Front wheel walker  Distance Ambulated (ft) 150 ft  Activity Response Tolerated well  Mobility Referral Yes  Mobility visit 1 Mobility  Mobility Specialist Start Time (ACUTE ONLY) 1026  Mobility Specialist Stop Time (ACUTE ONLY) 1037  Mobility Specialist Time Calculation (min) (ACUTE ONLY) 11 min   Pt was received in bed and agreed to mobility. No complaints/issues during session. Returned to bed with all needs met. Call bell in reach.  [employer] - Mobility Specialist

## 2024-10-16 ENCOUNTER — Telehealth: Payer: Self-pay

## 2024-10-16 NOTE — Transitions of Care (Post Inpatient/ED Visit) (Signed)
 "  10/16/2024  Name: Ariana Herrera MRN: [id number] DOB: 1966/10/06  Today's TOC FU Call Status: Today's TOC FU Call Status:: Successful TOC FU Call Completed TOC FU Call Complete Date: 10/16/24  Patient's Name and Date of Birth confirmed. Name, DOB  Transition Care Management Follow-up Telephone Call Date of Discharge: 10/15/24 Discharge Facility: [facility]) Type of Discharge: Inpatient Admission Primary Inpatient Discharge Diagnosis:: Hypovolemic; Pneumonia How have you been since you were released from the hospital?: Better Any questions or concerns?: No  Items Reviewed: Did you receive and understand the discharge instructions provided?: Yes Medications obtained,verified, and reconciled?: Yes (Medications Reviewed) Any new allergies since your discharge?: No Dietary orders reviewed?: Yes Type of Diet Ordered:: Low sodium Heart Healthy Do you have support at home?: Yes People in Home [RPT]: spouse Name of Support/Comfort Primary Source: [name]  Medications Reviewed Today: Medications Reviewed Today     Reviewed by Moises Reusing, RN (Case Manager) on 10/16/24 at 1048  Med List Status: <None>   Medication Order Taking? Sig Documenting Provider Last Dose Status Informant  ACCU-CHEK GUIDE test strip [id number]  3 (three) times daily. [provider]  Active Self, Pharmacy Records  albuterol  (ACCUNEB ) 1.25 MG/3ML nebulizer solution [id number] No Take 1 ampule by nebulization every 6 (six) hours as needed for wheezing or shortness of breath. [provider] Past Month Active Self, Pharmacy Records  albuterol  (VENTOLIN  HFA) 108 (90 Base) MCG/ACT inhaler [id number] No Inhale 2 puffs into the lungs every 6 (six) hours as needed for wheezing or shortness of breath. Cyndi Shaver, PA-C Past Week Active Self, Pharmacy Records  azithromycin  (ZITHROMAX ) 250 MG tablet [id number]  Take 1 tablet (250 mg total) by mouth daily for 2 days. Raenelle Coria, MD   Active   cephALEXin  (KEFLEX ) 500 MG capsule [id number]  Take 1 capsule (500 mg total) by mouth every 8 (eight) hours for 4 days. Raenelle Coria, MD  Active   Continuous Glucose Sensor (DEXCOM G7 SENSOR) MISC [id number]  Change sensor every 10 days [provider]  Active Self, Pharmacy Records  fenofibrate  (TRICOR ) 145 MG tablet [id number] No Take 145 mg by mouth daily. [provider] Past Week Active Self, Pharmacy Records  gabapentin  (NEURONTIN ) 400 MG capsule [id number] No Take 1 capsule (400 mg total) by mouth 3 (three) times daily.  Patient taking differently: Take 400 mg by mouth 2 (two) times daily.   Tonette Lauraine HERO, PA-C Past Week Active Self, Pharmacy Records  HYDROcodone -acetaminophen  [facility]) 7.5-325 MG tablet [id number] No Take 1 tablet by mouth every 6 (six) hours as needed for moderate pain (pain score 4-6). Timmy Maude SAUNDERS, MD 10/12/2024 Active Self, Pharmacy Records  insulin  aspart (NOVOLOG ) 100 UNIT/ML injection [id number]  Use as directed via insulin  pump. Total daily dose 100 units.   Active Self, Pharmacy Records  Insulin  Disposable Pump (OMNIPOD 5 DEXG7G6 PODS GEN 5) MISC [id number]  Apply 1 pod every 3 days for insulin  administration.   Active Self, Pharmacy Records  LINZESS  290 MCG CAPS capsule [id number] No TAKE 1 CAPSULE BY MOUTH DAILY BEFORE BREAKFAST.  Patient taking differently: Take 290 mcg by mouth daily before breakfast.   Federico Rosario BROCKS, MD Past Week Active Self, Pharmacy Records  lubiprostone  (AMITIZA ) 24 MCG capsule [id number] No Take 1 capsule (24 mcg total) by mouth 2 (two) times daily with a meal. Federico Rosario BROCKS, MD Past Week Active Self, Pharmacy Records  metFORMIN  (GLUCOPHAGE ) 1000 MG tablet [id number] No Take 1 tablet (1,000 mg total) by  mouth in the morning and in the evening with meals.  Past Week Active Self, Pharmacy Records  midodrine  (PROAMATINE ) 5 MG tablet [id number]  Take 1 tablet (5 mg total) by mouth 3 (three) times daily with meals.  Raenelle Coria, MD  Active   nitroGLYCERIN  (NITROSTAT ) 0.4 MG SL tablet [id number] No Place 1 tablet (0.4 mg total) under the tongue every 5 (five) minutes as needed for chest pain. Vicci Rollo SAUNDERS, PA-C Unknown Active Self, Pharmacy Records  NOVOLOG  100 UNIT/ML injection [id number]  Inject into the skin as directed. [provider]  Active Self, Pharmacy Records  OLANZapine  (ZYPREXA ) 10 MG tablet [id number] No TAKE 1 TABLET BY MOUTH EVERYDAY AT BEDTIME  Patient taking differently: Take 10 mg by mouth at bedtime.   Timmy Maude SAUNDERS, MD Past Week Active Self, Pharmacy Records  ondansetron  (ZOFRAN -ODT) 8 MG disintegrating tablet [id number] No Take 1 tablet (8 mg total) by mouth every 8 (eight) hours as needed for nausea or vomiting. Almarie Waddell NOVAK, NP Past Week Active Self, Pharmacy Records  oxybutynin  (DITROPAN -XL) 5 MG 24 hr tablet [id number] No TAKE 1 TABLET BY MOUTH EVERYDAY AT BEDTIME  Patient taking differently: Take 5 mg by mouth at bedtime.   Cyndi Shaver, PA-C Past Week Active Self, Pharmacy Records  pantoprazole  (PROTONIX ) 40 MG tablet [id number] No Take 1 tablet (40 mg total) by mouth daily. Federico Rosario BROCKS, MD Past Week Active Self, Pharmacy Records  polyethylene glycol (MIRALAX  / GLYCOLAX ) 17 g packet [id number] No Take 17 g by mouth daily as needed.  Patient taking differently: Take 17 g by mouth daily as needed for mild constipation.   Drusilla Sabas RAMAN, MD Past Month Active Self, Pharmacy Records  prochlorperazine  (COMPAZINE ) 10 MG tablet [id number] No Take 1 tablet (10 mg total) by mouth every 6 (six) hours as needed for nausea or vomiting (Zofran  resumes 12/16). Drusilla Sabas RAMAN, MD Past Week Active Self, Pharmacy Records  QUEtiapine  (SEROQUEL ) 400 MG tablet [id number] No Take 2 tablets (800 mg total) by mouth at bedtime. Almarie Waddell NOVAK, NP Past Week Active Self, Pharmacy Records  rivaroxaban  (XARELTO ) 10 MG TABS tablet [id number] No Take 1 tablet (10 mg total) by mouth daily.  Tonette Lauraine HERO, PA-C Past Week Active Self, Pharmacy Records  rosuvastatin  (CRESTOR ) 40 MG tablet [id number] No Take 1 tablet (40 mg total) by mouth daily. Vicci Rollo SAUNDERS, PA-C Past Week Active Self, Pharmacy Records           Med Note CARLEEN CHURCH D   Sat Oct 13, 2024  1:29 PM)    senna (SENOKOT) 8.6 MG TABS tablet [id number]  Take 1 tablet (8.6 mg total) by mouth 2 (two) times daily. Raenelle Coria, MD  Active   sodium chloride  flush (NS) 0.9 % injection 10 mL [id number]   Timmy Maude SAUNDERS, MD  Active   talazoparib tosylate  (TALZENNA ) 0.35 MG capsule [id number] No Take 1 capsule (0.35 mg total) by mouth daily. Take for 21 days on, then off for 7 days. Repeat every 28 days. Timmy Maude SAUNDERS, MD Past Week Active Self, Pharmacy Records  Med List Note Carmella Asberry FALCON [facility] 12/22/23 1559): [place]  filled through [facility]            Home Care and Equipment/Supplies: Were Home Health Services Ordered?: No (The patient has support with a nurse and other staff through [facility] which is supportive through her insurance company) Any new equipment or medical supplies ordered?: No  Functional Questionnaire: Do you  need assistance with bathing/showering or dressing?: No Do you need assistance with meal preparation?: Yes (Community care will provide some meal assistanc while she recovers) Do you need assistance with eating?: No Do you have difficulty maintaining continence: No Do you need assistance with getting out of bed/getting out of a chair/moving?: No Do you have difficulty managing or taking your medications?: Yes (Her husband assists her)  Follow up appointments reviewed: PCP Follow-up appointment confirmed?: Yes Date of PCP follow-up appointment?: 11/14/24 Follow-up Provider: Waddell Mon. This is a new patient appt as she is a transfer from a provider that is no longer at the practice Specialist Hospital Follow-up appointment confirmed?: Yes Date of  Specialist follow-up appointment?: 11/06/24 Follow-Up Specialty Provider:: Lauraine Pepper. the patient is to call Dr. Timmy for a sooner appointment Do you need transportation to your follow-up appointment?: No Do you understand care options if your condition(s) worsen?: Yes-patient verbalized understanding  SDOH Interventions Today    Flowsheet Row Most Recent Value  SDOH Interventions   Food Insecurity Interventions Community Resources Provided  Housing Interventions Intervention Not Indicated  Transportation Interventions Intervention Not Indicated  Utilities Interventions Intervention Not Indicated    Medford Balboa, BSN, RN [place]  VBCI - Population Health RN Care Manager [phone]  "

## 2024-10-17 LAB — CULTURE, BLOOD (ROUTINE X 2)
Culture: NO GROWTH
Culture: NO GROWTH
Special Requests: ADEQUATE
Special Requests: ADEQUATE

## 2024-10-21 NOTE — Progress Notes (Signed)
 "  Acute Office Visit  Subjective:  Patient ID: Ariana Herrera, female    DOB: 12/22/1965  Age: 58 y.o. MRN: [id number]  CC: No chief complaint on file.     HPI Ariana Herrera is here for hospital follow up. Seen 10/12/2024 in the emergency department with productive cough, loss of appetite, lightheadedness, fatigue, and low blood pressure. Symptoms had been ongoing for 3 weeks prior to presentation. Labs most notable for creatinine 1.56, WBC 3300, hemoglobin 11.1, platelets 86,000, normal lactic acid, and negative respiratory virus panel.  No acute findings on head CT.  CT of the chest, abdomen, and pelvis showed new right upper lobe nodule, increased size of left upper lobe nodule, new atelectasis or pneumonia in the lingula, faint ground glass opacities in the bilateral lungs, esophageal wall thickening, and very large amount of stool throughout the colon. In the ED, she was given 2 L LR, Zofran , fentanyl , vancomycin , cefepime , and azithromycin , had Foley catheter placed for retained urine, and subsequently transferred to [facility] for admission.         Past Medical History:  Diagnosis Date   Anginal pain    Anxiety    Arthritis    Asthma    Bipolar 1 disorder (HCC)    Bipolar disease, chronic (HCC)    Breast cancer in female (HCC)    2019 and recurrent in 2023 to right breast, right axillary, and left neck   Coronary artery disease    Deep vein blood clot of right lower extremity (HCC)    Diabetes mellitus type 2 in obese    Goals of care, counseling/discussion 11/23/2021   History of external beam radiation therapy    Left neck-10/07/23-11/24/23-[facility]   Hyperlipidemia    Lymphedema    MI (myocardial infarction) (HCC)    was in [place]    Obesity    Pancreatitis    Personal history of chemotherapy    Personal history of radiation therapy    Sleep apnea     Past Surgical History:  Procedure Laterality Date   BREAST BIOPSY Left 10/03/2023    times 2   BREAST LUMPECTOMY Left 03/2019   CATARACT EXTRACTION Bilateral    CHOLECYSTECTOMY     IR IMAGING GUIDED PORT INSERTION  12/15/2021   IR PATIENT EVAL TECH 0-60 MINS  10/06/2023   IR REMOVAL TUN ACCESS W/ PORT W/O FL MOD SED  12/20/2019   IR US  GUIDE BX ASP/DRAIN  11/02/2021   LEFT HEART CATH AND CORONARY ANGIOGRAPHY N/A 08/22/2020   Procedure: LEFT HEART CATH AND CORONARY ANGIOGRAPHY;  Surgeon: Verlin Lonni BIRCH, MD;  Location: [facility] INVASIVE CV LAB;  Service: Cardiovascular;  Laterality: N/A;   TRIGGER FINGER RELEASE Bilateral    x 5   TUBAL LIGATION     UMBILICAL HERNIA REPAIR     x 2   uterine ablation      Family History  Problem Relation Age of Onset   Heart attack Mother 29   Diabetes Mother    Breast cancer Mother    Breast cancer Cousin    Colon cancer Neg Hx    Esophageal cancer Neg Hx    Pancreatic cancer Neg Hx    Stomach cancer Neg Hx     Social History   Socioeconomic History   Marital status: Married    Spouse name: Not on file   Number of children: Not on file   Years of education: Not on file   Highest education level:  Some college, no degree  Occupational History   Occupation: disabled due to neuropathy and cancer treatments  Tobacco Use   Smoking status: Every Day    Current packs/day: 1.00    Average packs/day: 1 pack/day for 40.0 years (40.0 ttl pk-yrs)    Types: Cigarettes    Start date: 83   Smokeless tobacco: Never  Vaping Use   Vaping status: Never Used  Substance and Sexual Activity   Alcohol use: Never   Drug use: Yes    Types: Marijuana    Comment: occasionally   Sexual activity: Not Currently  Other Topics Concern   Not on file  Social History Narrative   Not on file   Social Drivers of Health   Tobacco Use: High Risk (10/12/2024)   Patient History    Smoking Tobacco Use: Every Day    Smokeless Tobacco Use: Never    Passive Exposure: Not on file  Financial Resource Strain: High Risk (08/15/2024)   Overall  Financial Resource Strain (CARDIA)    Difficulty of Paying Living Expenses: Hard  Food Insecurity: Food Insecurity Present (10/16/2024)   Epic    Worried About Programme Researcher, Broadcasting/film/video in the Last Year: Sometimes true    Ran Out of Food in the Last Year: Never true  Transportation Needs: No Transportation Needs (10/16/2024)   Epic    Lack of Transportation (Medical): No    Lack of Transportation (Non-Medical): No  Physical Activity: Insufficiently Active (08/15/2024)   Exercise Vital Sign    Days of Exercise per Week: 3 days    Minutes of Exercise per Session: 10 min  Stress: Stress Concern Present (08/15/2024)   [employer] of Occupational Health - Occupational Stress Questionnaire    Feeling of Stress: To some extent  Social Connections: Moderately Isolated (08/15/2024)   Social Connection and Isolation Panel    Frequency of Communication with Friends and Family: Twice a week    Frequency of Social Gatherings with Friends and Family: More than three times a week    Attends Religious Services: Never    Database Administrator or Organizations: No    Attends Engineer, Structural: Not on file    Marital Status: Married  Catering Manager Violence: Not At Risk (10/16/2024)   Epic    Fear of Current or Ex-Partner: No    Emotionally Abused: No    Physically Abused: No    Sexually Abused: No  Depression (PHQ2-9): Low Risk (10/12/2024)   Depression (PHQ2-9)    PHQ-2 Score: 0  Alcohol Screen: Low Risk (12/27/2023)   Alcohol Screen    Last Alcohol Screening Score (AUDIT): 1  Housing: Unknown (10/16/2024)   Epic    Unable to Pay for Housing in the Last Year: No    Number of Times Moved in the Last Year: Not on file    Homeless in the Last Year: No  Utilities: Not At Risk (10/16/2024)   Epic    Threatened with loss of utilities: No  Health Literacy: Not on file    ROS All ROS negative except what is listed in the HPI.   Objective:   Today's Vitals: LMP 10/04/2016  Comment: after procedure to stop bleeding  Physical Exam  Assessment & Plan:   Problem List Items Addressed This Visit   None     Follow-up: No follow-ups on file.   Waddell FURY Almarie, DNP, FNP-C  I,Emily Lagle,acting as a neurosurgeon for Waddell KATHEE Almarie, NP.,have documented all relevant documentation  on the behalf of Waddell KATHEE Mon, NP.   I, Waddell KATHEE Mon, NP, have reviewed all documentation for this visit. The documentation on 10/23/2024 for the exam, diagnosis, procedures, and orders are all accurate and complete. "

## 2024-10-22 ENCOUNTER — Other Ambulatory Visit: Payer: Self-pay | Admitting: Hematology & Oncology

## 2024-10-22 ENCOUNTER — Other Ambulatory Visit (HOSPITAL_COMMUNITY): Payer: Self-pay

## 2024-10-22 ENCOUNTER — Encounter: Payer: Self-pay | Admitting: Hematology & Oncology

## 2024-10-22 ENCOUNTER — Ambulatory Visit: Admitting: Physical Therapy

## 2024-10-22 DIAGNOSIS — C50011 Malignant neoplasm of nipple and areola, right female breast: Secondary | ICD-10-CM

## 2024-10-22 DIAGNOSIS — C50012 Malignant neoplasm of nipple and areola, left female breast: Secondary | ICD-10-CM

## 2024-10-22 NOTE — Progress Notes (Signed)
 DISCONTINUE ON PATHWAY REGIMEN - Breast     A cycle is every 21 days:     Sacituzumab govitecan -hziy   **Always confirm dose/schedule in your pharmacy ordering system**  PRIOR TREATMENT: [id number]: Sacituzumab Govitecan  10 mg/kg D1, 8 q21 Days  START ON PATHWAY REGIMEN - Breast     A cycle is every 28 days:     Liposomal doxorubicin   **Always confirm dose/schedule in your pharmacy ordering system**  Patient Characteristics: Distant Metastases or Locoregional Recurrent Disease - Unresectable, M0 or Locally Advanced Unresectable Disease Progressing after Neoadjuvant and Local Therapies, M0, HER2 Negative/Ultralow/Low, ER Negative, Chemotherapy, HER2 Negative/Ultralow, Third  Line and Beyond, [employer] or Not a Candidate for Molecular Targeted Therapy Therapeutic Status: Distant Metastases ER Status: Negative (-) PR Status: Negative (-) HER2 Status: Negative (-) Therapy Approach Indicated: Standard Chemotherapy/Endocrine Therapy Line of Therapy: Third Line and Beyond Intent of Therapy: Non-Curative / Palliative Intent, Discussed with Patient

## 2024-10-23 ENCOUNTER — Encounter: Payer: Self-pay | Admitting: Hematology & Oncology

## 2024-10-23 ENCOUNTER — Other Ambulatory Visit (HOSPITAL_BASED_OUTPATIENT_CLINIC_OR_DEPARTMENT_OTHER): Payer: Self-pay

## 2024-10-23 ENCOUNTER — Ambulatory Visit: Admitting: Family Medicine

## 2024-10-23 ENCOUNTER — Encounter: Payer: Self-pay | Admitting: Family Medicine

## 2024-10-23 VITALS — BP 106/57 | HR 99 | Ht 62.0 in | Wt 135.0 lb

## 2024-10-23 DIAGNOSIS — R110 Nausea: Secondary | ICD-10-CM

## 2024-10-23 DIAGNOSIS — Z09 Encounter for follow-up examination after completed treatment for conditions other than malignant neoplasm: Secondary | ICD-10-CM

## 2024-10-23 DIAGNOSIS — J189 Pneumonia, unspecified organism: Secondary | ICD-10-CM

## 2024-10-23 MED ORDER — PROMETHAZINE HCL 12.5 MG PO TABS
12.5000 mg | ORAL_TABLET | Freq: Four times a day (QID) | ORAL | 1 refills | Status: AC | PRN
  Filled 2024-10-23: qty 20, 5d supply, fill #0

## 2024-10-24 ENCOUNTER — Encounter: Payer: Self-pay | Admitting: Hematology & Oncology

## 2024-10-24 ENCOUNTER — Other Ambulatory Visit: Payer: Self-pay

## 2024-10-24 ENCOUNTER — Ambulatory Visit (HOSPITAL_COMMUNITY): Attending: Hematology & Oncology

## 2024-10-26 NOTE — Progress Notes (Unsigned)
 Pharmacist Chemotherapy Monitoring - Initial Assessment    Anticipated start date: 11/02/2024   The following has been reviewed per standard work regarding the patient's treatment regimen: The patient's diagnosis, treatment plan and drug doses, and organ/hematologic function Lab orders and baseline tests specific to treatment regimen  The treatment plan start date, drug sequencing, and pre-medications Prior authorization status  Patient's documented medication list, including drug-drug interaction screen and prescriptions for anti-emetics and supportive care specific to the treatment regimen The drug concentrations, fluid compatibility, administration routes, and timing of the medications to be used The patient's access for treatment and lifetime cumulative dose history, if applicable  The patient's medication allergies and previous infusion related reactions, if applicable   Changes made to treatment plan:  N/A  Follow up needed:  ECHO ordered   Norleen JAYSON Sou, [facility], 10/26/2024  3:34 PM

## 2024-10-30 ENCOUNTER — Other Ambulatory Visit: Payer: Self-pay

## 2024-10-30 ENCOUNTER — Other Ambulatory Visit (HOSPITAL_BASED_OUTPATIENT_CLINIC_OR_DEPARTMENT_OTHER): Payer: Self-pay

## 2024-11-01 ENCOUNTER — Ambulatory Visit: Payer: Self-pay | Admitting: Hematology & Oncology

## 2024-11-01 ENCOUNTER — Other Ambulatory Visit (HOSPITAL_BASED_OUTPATIENT_CLINIC_OR_DEPARTMENT_OTHER): Payer: Self-pay

## 2024-11-01 ENCOUNTER — Other Ambulatory Visit: Payer: Self-pay | Admitting: Hematology & Oncology

## 2024-11-01 ENCOUNTER — Ambulatory Visit (HOSPITAL_COMMUNITY)
Admission: RE | Admit: 2024-11-01 | Discharge: 2024-11-01 | Disposition: A | Source: Ambulatory Visit | Attending: Hematology & Oncology | Admitting: Hematology & Oncology

## 2024-11-01 ENCOUNTER — Ambulatory Visit (HOSPITAL_COMMUNITY)
Admission: RE | Admit: 2024-11-01 | Discharge: 2024-11-01 | Disposition: A | Discharge status: Home / Self Care | Source: Ambulatory Visit | Attending: Vascular Surgery | Admitting: Vascular Surgery

## 2024-11-01 ENCOUNTER — Other Ambulatory Visit: Payer: Self-pay

## 2024-11-01 DIAGNOSIS — C50012 Malignant neoplasm of nipple and areola, left female breast: Secondary | ICD-10-CM

## 2024-11-01 LAB — ECHOCARDIOGRAM LIMITED
AR max vel: 2.31 cm2
AV Area VTI: 2.65 cm2
AV Area mean vel: 2.36 cm2
AV Mean grad: 3 mmHg
AV Peak grad: 6.7 mmHg
Ao pk vel: 1.29 m/s
Area-P 1/2: 3.97 cm2
S' Lateral: 2.39 cm

## 2024-11-01 LAB — GLUCOSE, CAPILLARY: Glucose-Capillary: 162 mg/dL — ABNORMAL HIGH (ref 70–99)

## 2024-11-01 MED ORDER — HYDROCODONE-ACETAMINOPHEN 7.5-325 MG PO TABS
1.0000 | ORAL_TABLET | Freq: Four times a day (QID) | ORAL | 0 refills | Status: DC | PRN
  Filled 2024-11-01: qty 120, 30d supply, fill #0

## 2024-11-01 MED ORDER — PERFLUTREN LIPID MICROSPHERE
1.0000 mL | INTRAVENOUS | Status: AC | PRN
  Administered 2024-11-01: 4 mL via INTRAVENOUS

## 2024-11-01 MED ORDER — FLUDEOXYGLUCOSE F - 18 (FDG) INJECTION
6.2000 | Freq: Once | INTRAVENOUS | Status: AC
  Administered 2024-11-01: 6.71 via INTRAVENOUS

## 2024-11-02 ENCOUNTER — Encounter: Payer: Self-pay | Admitting: Hematology & Oncology

## 2024-11-02 ENCOUNTER — Inpatient Hospital Stay

## 2024-11-02 ENCOUNTER — Inpatient Hospital Stay: Admitting: Hematology & Oncology

## 2024-11-03 ENCOUNTER — Other Ambulatory Visit: Payer: Self-pay

## 2024-11-05 ENCOUNTER — Other Ambulatory Visit: Payer: Self-pay

## 2024-11-06 ENCOUNTER — Inpatient Hospital Stay

## 2024-11-06 ENCOUNTER — Inpatient Hospital Stay: Admitting: Family

## 2024-11-07 ENCOUNTER — Other Ambulatory Visit (HOSPITAL_COMMUNITY)

## 2024-11-08 ENCOUNTER — Other Ambulatory Visit (HOSPITAL_BASED_OUTPATIENT_CLINIC_OR_DEPARTMENT_OTHER): Payer: Self-pay

## 2024-11-08 ENCOUNTER — Ambulatory Visit: Admitting: Family Medicine

## 2024-11-08 ENCOUNTER — Encounter: Payer: Self-pay | Admitting: Family Medicine

## 2024-11-08 VITALS — BP 108/59 | HR 106 | Ht 62.0 in | Wt 130.0 lb

## 2024-11-08 DIAGNOSIS — L989 Disorder of the skin and subcutaneous tissue, unspecified: Secondary | ICD-10-CM

## 2024-11-08 DIAGNOSIS — E1165 Type 2 diabetes mellitus with hyperglycemia: Secondary | ICD-10-CM

## 2024-11-08 DIAGNOSIS — Z794 Long term (current) use of insulin: Secondary | ICD-10-CM

## 2024-11-08 DIAGNOSIS — K219 Gastro-esophageal reflux disease without esophagitis: Secondary | ICD-10-CM

## 2024-11-08 LAB — MICROALBUMIN / CREATININE URINE RATIO
Creatinine,U: 81.4 mg/dL
Microalb Creat Ratio: UNDETERMINED mg/g (ref 0.0–30.0)
Microalb, Ur: <0.7 mg/dL

## 2024-11-08 MED ORDER — PANTOPRAZOLE SODIUM 40 MG PO TBEC
40.0000 mg | DELAYED_RELEASE_TABLET | Freq: Every day | ORAL | 1 refills | Status: DC
  Filled 2024-11-08: qty 90, 90d supply, fill #0

## 2024-11-08 NOTE — Progress Notes (Signed)
 "  Acute Office Visit  Subjective:  Patient ID: Ariana Herrera, female    DOB: 1966-03-03  Age: 59 y.o. MRN: [id number]  CC:  Chief Complaint  Patient presents with   Medical Management of Chronic Issues      HPI [name] is here for bumps/lumps and diabetes labs.    Discussed the use of AI scribe software for clinical note transcription with the patient, who gave verbal consent to proceed.  History of Present Illness Ariana Herrera is a 59 year old female with a history of breast cancer who presents with skin lesions around her left breast.  For approximately two months, she has developed multiple hard nodules around the perimeter of her left breast. Initially, there were two spots, but they have since multiplied. She does not report pain She has a history of lymphedema in the affected breast with chronic discoloration post-radiation.. A recent PET scan ordered by Dr. Timmy did not show evidence of obvious breast cancer.  She is currently on pantoprazole  (needs refill) for heartburn and uses an insulin  pump for diabetes management. Her blood sugar readings in the morning are typically between 120 and 139 mg/dL. She also takes metformin .  She reports a persistent but improving cough and wheezing following a recent hospitalization for pneumonia. The cough can be productive productive of thick, clear mucus. No fever, hemoptysis, or difficulty breathing.      Diabetes: - Checking glucose at home: Dexcom - Medications: Insulin  aspart 100 units as directed via pump and Metformin  1000 mg daily.  - Compliance: good - Diet:  - Exercise:  - Eye exam: 08/07/2024, retinopathy detected.  - Foot exam: 02/20/2024 - Microalbumin: today - Denies symptoms of hypoglycemia, polyuria, polydipsia, numbness extremities, foot ulcers/trauma, wounds that are not healing, medication side effects    Lab Results  Component Value Date   HGBA1C 9.3 05/02/2023    Past Medical  History:  Diagnosis Date   Anginal pain    Anxiety    Arthritis    Asthma    Bipolar 1 disorder (HCC)    Bipolar disease, chronic (HCC)    Breast cancer in female (HCC)    2019 and recurrent in 2023 to right breast, right axillary, and left neck   Coronary artery disease    Deep vein blood clot of right lower extremity (HCC)    Diabetes mellitus type 2 in obese    Goals of care, counseling/discussion 11/23/2021   History of external beam radiation therapy    Left neck-10/07/23-11/24/23-[facility]   Hyperlipidemia    Lymphedema    MI (myocardial infarction) (HCC)    was in [place]    Obesity    Pancreatitis    Personal history of chemotherapy    Personal history of radiation therapy    Sleep apnea     Past Surgical History:  Procedure Laterality Date   BREAST BIOPSY Left 10/03/2023   times 2   BREAST LUMPECTOMY Left 03/2019   CATARACT EXTRACTION Bilateral    CHOLECYSTECTOMY     IR IMAGING GUIDED PORT INSERTION  12/15/2021   IR PATIENT EVAL TECH 0-60 MINS  10/06/2023   IR REMOVAL TUN ACCESS W/ PORT W/O FL MOD SED  12/20/2019   IR US  GUIDE BX ASP/DRAIN  11/02/2021   LEFT HEART CATH AND CORONARY ANGIOGRAPHY N/A 08/22/2020   Procedure: LEFT HEART CATH AND CORONARY ANGIOGRAPHY;  Surgeon: Verlin Lonni BIRCH, MD;  Location: [facility];  Service: Cardiovascular;  Laterality:  N/A;   TRIGGER FINGER RELEASE Bilateral    x 5   TUBAL LIGATION     UMBILICAL HERNIA REPAIR     x 2   uterine ablation      Family History  Problem Relation Age of Onset   Heart attack Mother 35   Diabetes Mother    Breast cancer Mother    Breast cancer Cousin    Colon cancer Neg Hx    Esophageal cancer Neg Hx    Pancreatic cancer Neg Hx    Stomach cancer Neg Hx     Social History   Socioeconomic History   Marital status: Married    Spouse name: Not on file   Number of children: Not on file   Years of education: Not on file   Highest education level: Some college, no  degree  Occupational History   Occupation: disabled due to neuropathy and cancer treatments  Tobacco Use   Smoking status: Every Day    Current packs/day: 1.00    Average packs/day: 1 pack/day for 40.0 years (40.0 ttl pk-yrs)    Types: Cigarettes    Start date: 75   Smokeless tobacco: Never  Vaping Use   Vaping status: Never Used  Substance and Sexual Activity   Alcohol use: Never   Drug use: Yes    Types: Marijuana    Comment: occasionally   Sexual activity: Not Currently  Other Topics Concern   Not on file  Social History Narrative   Not on file   Social Drivers of Health   Tobacco Use: High Risk (11/08/2024)   Patient History    Smoking Tobacco Use: Every Day    Smokeless Tobacco Use: Never    Passive Exposure: Not on file  Financial Resource Strain: High Risk (08/15/2024)   Overall Financial Resource Strain (CARDIA)    Difficulty of Paying Living Expenses: Hard  Food Insecurity: Food Insecurity Present (10/16/2024)   Epic    Worried About Programme Researcher, Broadcasting/film/video in the Last Year: Sometimes true    Ran Out of Food in the Last Year: Never true  Transportation Needs: No Transportation Needs (10/16/2024)   Epic    Lack of Transportation (Medical): No    Lack of Transportation (Non-Medical): No  Physical Activity: Insufficiently Active (08/15/2024)   Exercise Vital Sign    Days of Exercise per Week: 3 days    Minutes of Exercise per Session: 10 min  Stress: Stress Concern Present (08/15/2024)   [employer] of Occupational Health - Occupational Stress Questionnaire    Feeling of Stress: To some extent  Social Connections: Moderately Isolated (08/15/2024)   Social Connection and Isolation Panel    Frequency of Communication with Friends and Family: Twice a week    Frequency of Social Gatherings with Friends and Family: More than three times a week    Attends Religious Services: Never    Database Administrator or Organizations: No    Attends Banker  Meetings: Not on file    Marital Status: Married  Intimate Partner Violence: Not At Risk (10/16/2024)   Epic    Fear of Current or Ex-Partner: No    Emotionally Abused: No    Physically Abused: No    Sexually Abused: No  Depression (PHQ2-9): High Risk (11/08/2024)   Depression (PHQ2-9)    PHQ-2 Score: 19  Alcohol Screen: Low Risk (12/27/2023)   Alcohol Screen    Last Alcohol Screening Score (AUDIT): 1  Housing: Unknown (10/16/2024)  Epic    Unable to Pay for Housing in the Last Year: No    Number of Times Moved in the Last Year: Not on file    Homeless in the Last Year: No  Utilities: Not At Risk (10/16/2024)   Epic    Threatened with loss of utilities: No  Health Literacy: Not on file    ROS All ROS negative except what is listed in the HPI.   Objective:   Today's Vitals: BP (!) 108/59   Pulse (!) 106   Ht 5' 2 (1.575 m)   Wt 130 lb (59 kg)   LMP 10/04/2016 Comment: after procedure to stop bleeding  SpO2 99%   BMI 23.78 kg/m   Physical Exam Vitals reviewed.  Constitutional:      Appearance: Normal appearance.  Cardiovascular:     Rate and Rhythm: Regular rhythm. Tachycardia present.  Pulmonary:     Effort: Pulmonary effort is normal.     Breath sounds: Normal breath sounds.  Skin:    General: Skin is warm and dry.     Comments: See pictures of small cyst-like nodules around left breast  Neurological:     Mental Status: She is alert and oriented to person, place, and time.  Psychiatric:        Mood and Affect: Mood normal.        Behavior: Behavior normal.        Thought Content: Thought content normal.        Judgment: Judgment normal.              Assessment & Plan:   Problem List Items Addressed This Visit       Active Problems   Type 2 diabetes mellitus with hyperglycemia, with long-term current use of insulin  (HCC) - Primary   Blood glucose levels monitored with Dexcom sensor, morning readings 120-139 mg/dL. Insulin  pump used with  occasional manual bolusing. Metformin  continued. Endocrinologist follow-up scheduled for April 20th. - Ordered A1c test today. - Continue insulin  pump and metformin . - Ensure follow-up with endocrinologist in April.      Relevant Orders   Hemoglobin A1c   Microalbumin / creatinine urine ratio   CBC with Differential/Platelet   Basic metabolic panel with GFR   Magnesium    Other Visit Diagnoses       Gastroesophageal reflux disease, unspecified whether esophagitis present       Relevant Medications   pantoprazole  (PROTONIX ) 40 MG tablet     Lesion of subcutaneous tissue     Multiple hard lesions around the left breast for about two months. PET scan negative for breast cancer or unusual lymph nodes.  - Took pictures of lesions for chart documentation  - Messaged Dr. Timmy to discuss surveillance and  next steps. - Monitor lesions for changes in size or number. - Contact Dr. Timmy if lesions increase significantly.           Follow-up: Return if symptoms worsen or fail to improve.   Waddell FURY Almarie, DNP, FNP-C  I,Emily Lagle,acting as a neurosurgeon for Waddell KATHEE Almarie, NP.,have documented all relevant documentation on the behalf of Waddell KATHEE Almarie, NP.   I, Waddell KATHEE Almarie, NP, have reviewed all documentation for this visit. The documentation on 11/08/2024 for the exam, diagnosis, procedures, and orders are all accurate and complete. "

## 2024-11-08 NOTE — Assessment & Plan Note (Signed)
 Blood glucose levels monitored with Dexcom sensor, morning readings 120-139 mg/dL. Insulin  pump used with occasional manual bolusing. Metformin  continued. Endocrinologist follow-up scheduled for April 20th. - Ordered A1c test today. - Continue insulin  pump and metformin . - Ensure follow-up with endocrinologist in April.

## 2024-11-09 ENCOUNTER — Ambulatory Visit

## 2024-11-09 ENCOUNTER — Encounter: Payer: Self-pay | Admitting: Hematology & Oncology

## 2024-11-09 ENCOUNTER — Encounter: Payer: Self-pay | Admitting: Family

## 2024-11-09 ENCOUNTER — Other Ambulatory Visit (HOSPITAL_BASED_OUTPATIENT_CLINIC_OR_DEPARTMENT_OTHER): Payer: Self-pay

## 2024-11-09 ENCOUNTER — Ambulatory Visit: Payer: Self-pay | Admitting: Family Medicine

## 2024-11-09 LAB — CBC WITH DIFFERENTIAL/PLATELET
Basophils Absolute: 0 K/uL (ref 0.0–0.1)
Basophils Relative: 0.7 % (ref 0.0–3.0)
Eosinophils Absolute: 0 K/uL (ref 0.0–0.7)
Eosinophils Relative: 0.4 % (ref 0.0–5.0)
HCT: 34.9 % — ABNORMAL LOW (ref 36.0–46.0)
Hemoglobin: 11.9 g/dL — ABNORMAL LOW (ref 12.0–15.0)
Lymphocytes Relative: 34 % (ref 12.0–46.0)
Lymphs Abs: 1.1 K/uL (ref 0.7–4.0)
MCHC: 34.1 g/dL (ref 30.0–36.0)
MCV: 109.9 fl — ABNORMAL HIGH (ref 78.0–100.0)
Monocytes Absolute: 0.4 K/uL (ref 0.1–1.0)
Monocytes Relative: 10.9 % (ref 3.0–12.0)
Neutro Abs: 1.8 K/uL (ref 1.4–7.7)
Neutrophils Relative %: 54 % (ref 43.0–77.0)
Platelets: 147 K/uL — ABNORMAL LOW (ref 150.0–400.0)
RBC: 3.18 Mil/uL — ABNORMAL LOW (ref 3.87–5.11)
RDW: 18.5 % — ABNORMAL HIGH (ref 11.5–15.5)
WBC: 3.3 K/uL — ABNORMAL LOW (ref 4.0–10.5)

## 2024-11-09 LAB — BASIC METABOLIC PANEL WITH GFR
BUN: 19 mg/dL (ref 6–23)
CO2: 27 meq/L (ref 19–32)
Calcium: 8.3 mg/dL — ABNORMAL LOW (ref 8.4–10.5)
Chloride: 102 meq/L (ref 96–112)
Creatinine, Ser: 0.71 mg/dL (ref 0.40–1.20)
GFR: 95.27 mL/min
Glucose, Bld: 112 mg/dL — ABNORMAL HIGH (ref 70–99)
Potassium: 4.2 meq/L (ref 3.5–5.1)
Sodium: 137 meq/L (ref 135–145)

## 2024-11-09 LAB — MAGNESIUM: Magnesium: 1.3 mg/dL — ABNORMAL LOW (ref 1.5–2.5)

## 2024-11-09 LAB — VITAMIN D 25 HYDROXY (VIT D DEFICIENCY, FRACTURES): VITD: 14.01 ng/mL — ABNORMAL LOW (ref 30.00–100.00)

## 2024-11-09 LAB — HEMOGLOBIN A1C: Hgb A1c MFr Bld: 7.1 % — ABNORMAL HIGH (ref 4.6–6.5)

## 2024-11-09 MED ORDER — MAGNESIUM OXIDE 400 MG PO TABS
400.0000 mg | ORAL_TABLET | Freq: Two times a day (BID) | ORAL | 0 refills | Status: AC
  Filled 2024-11-09: qty 120, 60d supply, fill #0

## 2024-11-12 ENCOUNTER — Encounter: Payer: Self-pay | Admitting: Family Medicine

## 2024-11-12 ENCOUNTER — Other Ambulatory Visit (HOSPITAL_BASED_OUTPATIENT_CLINIC_OR_DEPARTMENT_OTHER): Payer: Self-pay

## 2024-11-12 ENCOUNTER — Ambulatory Visit: Payer: Self-pay | Admitting: Family Medicine

## 2024-11-12 DIAGNOSIS — E559 Vitamin D deficiency, unspecified: Secondary | ICD-10-CM

## 2024-11-12 MED ORDER — VITAMIN D (ERGOCALCIFEROL) 1.25 MG (50000 UNIT) PO CAPS
50000.0000 [IU] | ORAL_CAPSULE | ORAL | 0 refills | Status: AC
  Filled 2024-11-12: qty 12, 84d supply, fill #0

## 2024-11-13 ENCOUNTER — Encounter: Payer: Self-pay | Admitting: Internal Medicine

## 2024-11-13 ENCOUNTER — Other Ambulatory Visit: Payer: Self-pay

## 2024-11-13 NOTE — Telephone Encounter (Signed)
 Pt chart was reviewed and noted that pt pantoprazole  prescription was  to [facility] on 11/08/2024 . Pt made aware.  Pt verbalized understanding with all questions answered.

## 2024-11-14 ENCOUNTER — Encounter: Admitting: Family Medicine

## 2024-11-20 ENCOUNTER — Encounter: Payer: Self-pay | Admitting: Family Medicine

## 2024-11-20 ENCOUNTER — Other Ambulatory Visit (HOSPITAL_BASED_OUTPATIENT_CLINIC_OR_DEPARTMENT_OTHER): Payer: Self-pay

## 2024-11-20 DIAGNOSIS — K219 Gastro-esophageal reflux disease without esophagitis: Secondary | ICD-10-CM

## 2024-11-20 MED ORDER — OMEPRAZOLE 40 MG PO CPDR
40.0000 mg | DELAYED_RELEASE_CAPSULE | Freq: Two times a day (BID) | ORAL | 1 refills | Status: AC
  Filled 2024-11-20: qty 180, 90d supply, fill #0

## 2024-11-23 ENCOUNTER — Inpatient Hospital Stay: Attending: Hematology & Oncology

## 2024-11-23 ENCOUNTER — Inpatient Hospital Stay: Admitting: Hematology & Oncology

## 2024-11-23 ENCOUNTER — Inpatient Hospital Stay

## 2024-11-23 ENCOUNTER — Encounter: Payer: Self-pay | Admitting: Hematology & Oncology

## 2024-11-23 VITALS — Ht 62.0 in | Wt 127.9 lb

## 2024-11-23 VITALS — BP 103/58 | HR 105 | Resp 20

## 2024-11-23 DIAGNOSIS — C50012 Malignant neoplasm of nipple and areola, left female breast: Secondary | ICD-10-CM

## 2024-11-23 DIAGNOSIS — Z86718 Personal history of other venous thrombosis and embolism: Secondary | ICD-10-CM

## 2024-11-23 DIAGNOSIS — R112 Nausea with vomiting, unspecified: Secondary | ICD-10-CM | POA: Insufficient documentation

## 2024-11-23 DIAGNOSIS — E538 Deficiency of other specified B group vitamins: Secondary | ICD-10-CM | POA: Diagnosis not present

## 2024-11-23 DIAGNOSIS — F17200 Nicotine dependence, unspecified, uncomplicated: Secondary | ICD-10-CM | POA: Insufficient documentation

## 2024-11-23 DIAGNOSIS — C50912 Malignant neoplasm of unspecified site of left female breast: Secondary | ICD-10-CM | POA: Insufficient documentation

## 2024-11-23 DIAGNOSIS — C50011 Malignant neoplasm of nipple and areola, right female breast: Secondary | ICD-10-CM

## 2024-11-23 DIAGNOSIS — Z17421 Hormone receptor negative with human epidermal growth factor receptor 2 negative status: Secondary | ICD-10-CM | POA: Insufficient documentation

## 2024-11-23 DIAGNOSIS — R634 Abnormal weight loss: Secondary | ICD-10-CM | POA: Insufficient documentation

## 2024-11-23 DIAGNOSIS — Z7901 Long term (current) use of anticoagulants: Secondary | ICD-10-CM | POA: Diagnosis not present

## 2024-11-23 DIAGNOSIS — G620 Drug-induced polyneuropathy: Secondary | ICD-10-CM

## 2024-11-23 LAB — CMP (CANCER CENTER ONLY)
ALT: 14 U/L (ref 0–44)
AST: 13 U/L — ABNORMAL LOW (ref 15–41)
Albumin: 3.6 g/dL (ref 3.5–5.0)
Alkaline Phosphatase: 75 U/L (ref 38–126)
Anion gap: 9 (ref 5–15)
BUN: 15 mg/dL (ref 6–20)
CO2: 24 mmol/L (ref 22–32)
Calcium: 8.9 mg/dL (ref 8.9–10.3)
Chloride: 105 mmol/L (ref 98–111)
Creatinine: 0.5 mg/dL (ref 0.44–1.00)
GFR, Estimated: >60 mL/min
Glucose, Bld: 205 mg/dL — ABNORMAL HIGH (ref 70–99)
Potassium: 3.9 mmol/L (ref 3.5–5.1)
Sodium: 139 mmol/L (ref 135–145)
Total Bilirubin: 0.3 mg/dL (ref 0.0–1.2)
Total Protein: 5.9 g/dL — ABNORMAL LOW (ref 6.5–8.1)

## 2024-11-23 LAB — CBC WITH DIFFERENTIAL (CANCER CENTER ONLY)
Abs Immature Granulocytes: 0.01 10*3/uL (ref 0.00–0.07)
Basophils Absolute: 0 10*3/uL (ref 0.0–0.1)
Basophils Relative: 0 %
Eosinophils Absolute: 0 10*3/uL (ref 0.0–0.5)
Eosinophils Relative: 0 %
HCT: 31.8 % — ABNORMAL LOW (ref 36.0–46.0)
Hemoglobin: 11.1 g/dL — ABNORMAL LOW (ref 12.0–15.0)
Immature Granulocytes: 0 %
Lymphocytes Relative: 28 %
Lymphs Abs: 0.7 10*3/uL (ref 0.7–4.0)
MCH: 37.8 pg — ABNORMAL HIGH (ref 26.0–34.0)
MCHC: 34.9 g/dL (ref 30.0–36.0)
MCV: 108.2 fL — ABNORMAL HIGH (ref 80.0–100.0)
Monocytes Absolute: 0.2 10*3/uL (ref 0.1–1.0)
Monocytes Relative: 8 %
Neutro Abs: 1.7 10*3/uL (ref 1.7–7.7)
Neutrophils Relative %: 64 %
Platelet Count: 165 10*3/uL (ref 150–400)
RBC: 2.94 MIL/uL — ABNORMAL LOW (ref 3.87–5.11)
RDW: 16.2 % — ABNORMAL HIGH (ref 11.5–15.5)
WBC Count: 2.7 10*3/uL — ABNORMAL LOW (ref 4.0–10.5)
nRBC: 0 % (ref 0.0–0.2)

## 2024-11-23 LAB — MAGNESIUM: Magnesium: 1.4 mg/dL — ABNORMAL LOW (ref 1.7–2.4)

## 2024-11-23 LAB — IRON AND IRON BINDING CAPACITY (CC-WL,HP ONLY)
Iron: 86 ug/dL (ref 28–170)
Saturation Ratios: 44 % — ABNORMAL HIGH (ref 10.4–31.8)
TIBC: 195 ug/dL — ABNORMAL LOW (ref 250–450)
UIBC: 109 ug/dL

## 2024-11-23 LAB — SAMPLE TO BLOOD BANK

## 2024-11-23 LAB — LACTATE DEHYDROGENASE: LDH: 160 U/L (ref 105–235)

## 2024-11-23 LAB — FERRITIN: Ferritin: 414 ng/mL — ABNORMAL HIGH (ref 11–307)

## 2024-11-23 LAB — SAVE SMEAR(SSMR), FOR PROVIDER SLIDE REVIEW

## 2024-11-23 MED ORDER — DRONABINOL 2.5 MG PO CAPS: 2.5000 mg | ORAL_CAPSULE | Freq: Two times a day (BID) | ORAL | 0 refills | Status: DC

## 2024-11-23 MED ORDER — MAGNESIUM SULFATE 2 GM/50ML IV SOLN
2.0000 g | Freq: Once | INTRAVENOUS | Status: AC
  Administered 2024-11-23: 2 g via INTRAVENOUS
  Filled 2024-11-23: qty 50

## 2024-11-23 MED ORDER — SODIUM CHLORIDE 0.9 % IV SOLN: INTRAVENOUS | Status: DC

## 2024-11-23 NOTE — Progress Notes (Signed)
 DISCONTINUE ON PATHWAY REGIMEN - Breast     A cycle is every 21 days:     Sacituzumab govitecan -hziy   **Always confirm dose/schedule in your pharmacy ordering system**  PRIOR TREATMENT: [id number]: Sacituzumab Govitecan  10 mg/kg D1, 8 q21 Days  START ON PATHWAY REGIMEN - Breast     A cycle is every 28 days:     Liposomal doxorubicin   **Always confirm dose/schedule in your pharmacy ordering system**  Patient Characteristics: Distant Metastases or Locoregional Recurrent Disease - Unresectable, M0 or Locally Advanced Unresectable Disease Progressing after Neoadjuvant and Local Therapies, M0, HER2 Negative/Ultralow/Low, ER Negative, Chemotherapy, HER2 Negative/Ultralow, Third  Line and Beyond, [employer] or Not a Candidate for Molecular Targeted Therapy Therapeutic Status: Distant Metastases ER Status: Negative (-) PR Status: Negative (-) HER2 Status: Negative (-) Therapy Approach Indicated: Standard Chemotherapy/Endocrine Therapy Line of Therapy: Third Line and Beyond Intent of Therapy: Non-Curative / Palliative Intent, Discussed with Patient this

## 2024-11-23 NOTE — Patient Instructions (Signed)
 Device With a Small Disc Placed for Long-Term IV Use (Implanted Port): Care at Home An implanted port is a device with a small disc that's put under your skin. In most cases, it's placed in your chest. It can be used to draw blood and send medicines and fluids quickly into your bloodstream. You may need a port to give you: IV medicines that would bother the small veins in your hands or arms. IV medicines for a long time, such as medicines to kill cancer cells (chemotherapy). IV liquid nutrition for a long time. An implanted port has 2 main parts: The portal. This is the round disc where the needle is put in. It may look like a small, raised area under your skin. The catheter. This is a soft tube that connects the port to a vein. You may be able to do more everyday tasks with a port than with other types of long-term IVs. How is my port accessed?  A numbing cream may be put on the skin over the port site. Your skin will be cleaned with a solution that kills germs. Your health care provider will gently pinch the port and put a needle in it. The port will be checked to make sure it's in the vein and working. If you need to get medicine all the time, the needle may be left in the port. Your provider will put a clear bandage over the needle site. The bandage and needle will need to be changed each week. What is flushing? Flushing helps keep the port working like it should. Flush your port as told. If your port is only used from time to time to give medicines or draw blood, you may need to flush it: Before and after medicines have been given. Before and after blood has been drawn. Every 4-6 weeks to keep it working well. If you get medicine through your port all the time, you may not need to flush it. Talk with your provider to learn more. How long will my port stay in? The port will stay in for as long as it's needed. When it's time for it to come out, you'll have a procedure to remove it. Follow  these instructions at home: Caring for your port and port site Flush your port as told. If you need to get an infusion of medicine over a few days, take care of your port as told. Make sure you: Take care of your port site. Wash your hands with soap and water  for at least 20 seconds before and after you change your bandage. If you can't use soap and water , use hand sanitizer. Put any used bandages or infusion bags in a plastic bag. Throw the bag in the trash. Keep the bandage that covers the needle clean and dry. Do not let it get wet. Do not use scissors or sharp objects near the tubes. Keep any tubes clamped, unless they're being used. Check the area around your port site every day for signs of infection. Check for: Redness, swelling, or pain. Fluid or blood. Warmth. Pus or a bad smell. Protect the skin around the port site. Avoid wearing bra straps that rub or irritate the site. Protect the skin around your port from seat belts. Place a soft pad over your chest if needed. Do not take baths, swim, or use a hot tub until you're told it's OK. Ask if you can shower. General instructions  Throw away any syringes in a container that's meant for sharp  items (sharps container). You can buy a sharps container from a pharmacy. You can also make one by using an empty, hard plastic bottle with a lid. Always carry a medical alert card or wear a medical alert bracelet. Ask what things are safe for you to do at home. Ask when you can go back to work or school. Where to find more information To learn more, go to: American Cancer Society at prombar.it. Click on the magnifying glass and type IV lines and ports. Find the link you need. American Society of Clinical Oncology at fabvets.de. Click on the magnifying glass and type getting chemo infusions. Find the link you need. Contact a health care provider if: You can't flush your port. You can't draw blood from the port. You have a fever or  chills. You have any signs of infection. You have swelling in your arm, neck, or shoulder. This information is not intended to replace advice given to you by your health care provider. Make sure you discuss any questions you have with your health care provider. Document Revised: 07/24/2024 Document Reviewed: 07/24/2024 Elsevier Patient Education  2025 [employer].

## 2024-11-23 NOTE — Patient Instructions (Signed)
 Magnesium Sulfate Injection What is this medication? MAGNESIUM SULFATE (mag NEE zee um SUL fate) prevents and treats low levels of magnesium in your body. It may also be used to prevent and treat seizures during pregnancy in people with high blood pressure disorders, such as preeclampsia or eclampsia. Magnesium plays an important role in maintaining the health of your muscles and nervous system. This medicine may be used for other purposes; ask your health care provider or pharmacist if you have questions. What should I tell my care team before I take this medication? They need to know if you have any of these conditions: Heart disease History of irregular heart beat Kidney disease An unusual or allergic reaction to magnesium sulfate, medications, foods, dyes, or preservatives Pregnant or trying to get pregnant Breast-feeding How should I use this medication? This medication is for infusion into a vein. It is given in a hospital or clinic setting. Talk to your care team about the use of this medication in children. While this medication may be prescribed for selected conditions, precautions do apply. Overdosage: If you think you have taken too much of this medicine contact a poison control center or emergency room at once. NOTE: This medicine is only for you. Do not share this medicine with others. What if I miss a dose? This does not apply. What may interact with this medication? Certain medications for anxiety or sleep Certain medications for seizures, such phenobarbital Digoxin Medications that relax muscles for surgery Narcotic medications for pain This list may not describe all possible interactions. Give your health care provider a list of all the medicines, herbs, non-prescription drugs, or dietary supplements you use. Also tell them if you smoke, drink alcohol, or use illegal drugs. Some items may interact with your medicine. What should I watch for while using this  medication? Your condition will be monitored carefully while you are receiving this medication. You may need blood work done while you are receiving this medication. What side effects may I notice from receiving this medication? Side effects that you should report to your care team as soon as possible: Allergic reactions--skin rash, itching, hives, swelling of the face, lips, tongue, or throat High magnesium level--confusion, drowsiness, facial flushing, redness, sweating, muscle weakness, fast or irregular heartbeat, trouble breathing Low blood pressure--dizziness, feeling faint or lightheaded, blurry vision Side effects that usually do not require medical attention (report to your care team if they continue or are bothersome): Headache Nausea This list may not describe all possible side effects. Call your doctor for medical advice about side effects. You may report side effects to FDA at 1-800-FDA-1088. Where should I keep my medication? This medication is given in a hospital or clinic and will not be stored at home. NOTE: This sheet is a summary. It may not cover all possible information. If you have questions about this medicine, talk to your doctor, pharmacist, or health care provider.  2024 Elsevier/Gold Standard (2021-06-24 00:00:00)

## 2024-11-23 NOTE — Progress Notes (Signed)
 " Hematology and Oncology Follow Up Visit  Ariana Herrera [id number] May 29, 1966 59 y.o. 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 daily-start on 12/22/2023 -  changed to 0.350 mg po q day on 03/06/2024, now 3 weeks on/1 week off since 07/20/2024 -DC on 11/23/2024 B12 oral 1,000 mcg once daily    Interim History:  Ariana Herrera is here today for follow-up.  She has had a lot of problems with nausea and vomiting.  I do not know if this might be the talazoparib.  I am going to going to stop this for right now.  She actually had a PET scan that was done on 11/01/2024.  This, surprisingly did not show any evidence of metabolically active disease.  She has small pulmonary nodules that were stable.  However, she has these subcutaneous nodules on her skin.  They measure about 4 to 5 mm each.  I am not sure what they are.  However, they are not had to be biopsied.  I will see if one of our surgeons can biopsy this.  This will be very helpful for us .  I will try her on some Marinol  to see if this may help with some of this nausea.  Hopefully, stopping the talazoparib will help.  Her last CA 27.29 was holding steady at 60.  We are holding her chemotherapy right now until we have more proof of obvious metastatic disease.  She is on Xarelto  for the thrombus in the left internal jugular vein.  We  really need to see if the thrombus is resolving.  I will have to see about maybe an MRI of the neck.  Overall, she is still smoking.  She is having some hoarseness.  She is having some shortness of breath.  Remember to think about a chest x-ray on her.  Overall, I would say that her performance status is probably ECOG 1-2.   Medications:  Allergies as of 11/23/2024       Reactions   Lithium Other (See Comments)   Spinal fluid built up in brain   Dulaglutide Nausea And Vomiting, Other (See Comments)   TRULICITY   Nitrofuran Derivatives Itching   Nitrofurantoin  Mono- MCR   Penicillins Other (See Comments)   UNKNOWN CHILDHOOD REACTION        Medication List        Accurate as of November 23, 2024  4:51 PM. If you have any questions, ask your nurse or doctor.          STOP taking these medications    rosuvastatin  40 MG tablet Commonly known as: CRESTOR  Stopped by: Maude Crease, MD       TAKE these medications    Accu-Chek Guide test strip Generic drug: glucose blood 3 (three) times daily.   albuterol  1.25 MG/3ML nebulizer solution Commonly known as: ACCUNEB  Take 1 ampule by nebulization  every 6 (six) hours as needed for wheezing or shortness of breath.   albuterol  108 (90 Base) MCG/ACT inhaler Commonly known as: VENTOLIN  HFA Inhale 2 puffs into the lungs every 6 (six) hours as needed for wheezing or shortness of breath.   Dexcom G7 Sensor Misc Change sensor every 10 days   fenofibrate  145 MG tablet Commonly known as: TRICOR  Take 145 mg by mouth daily.   gabapentin  400 MG capsule Commonly known as: Neurontin  Take 1 capsule (400 mg total) by mouth 3 (three) times daily.   HYDROcodone -acetaminophen  7.5-325 MG tablet Commonly known as: NORCO Take 1 tablet by mouth every 6 (six) hours as needed for moderate pain (pain score 4-6).   Linzess  290 MCG Caps capsule Generic drug: linaclotide  TAKE 1 CAPSULE BY MOUTH DAILY BEFORE BREAKFAST.   lubiprostone  24  MCG capsule Commonly known as: AMITIZA  Take 1 capsule (24 mcg total) by mouth 2 (two) times daily with a meal.   magnesium  oxide 400 MG tablet Commonly known as: MAG-OX Take 1 tablet (400 mg total) by mouth 2 (two) times daily for 3 days. (Can discard remainder)   metFORMIN  1000 MG tablet Commonly known as: GLUCOPHAGE  Take 1 tablet (1,000 mg total) by mouth in the morning and in the evening with meals.   midodrine  5 MG tablet Commonly known as: PROAMATINE  Take 1 tablet (5 mg total) by mouth 3 (three) times daily with meals.   nitroGLYCERIN  0.4 MG SL tablet Commonly known as: NITROSTAT  Place 1 tablet (0.4 mg total) under the tongue every 5 (five) minutes as needed for chest pain.   NovoLOG  100 UNIT/ML injection Generic drug: insulin  aspart Inject into the skin as directed.   insulin  aspart 100 UNIT/ML injection Commonly known as: novoLOG  Use as directed via insulin  pump. Total daily dose 100 units.   OLANZapine  10 MG tablet Commonly known as: ZYPREXA  TAKE 1 TABLET BY MOUTH EVERYDAY AT BEDTIME   omeprazole  40 MG capsule Commonly known as: PRILOSEC Take 1 capsule (40 mg total) by mouth 2 (two) times daily before a meal.   Omnipod 5 DexG7G6 Pods Gen 5 Misc Apply 1 pod every 3 days for insulin  administration.   ondansetron  8 MG disintegrating tablet Commonly known as: ZOFRAN -ODT Take 1 tablet (8 mg total) by mouth every 8 (eight) hours as needed for nausea or vomiting.   oxybutynin  5 MG 24 hr tablet Commonly known as: DITROPAN -XL TAKE 1 TABLET BY MOUTH EVERYDAY AT BEDTIME   PEG 3350  17 g Pack Take 17 g by mouth daily as needed.   promethazine  12.5 MG tablet Commonly known as: PHENERGAN  Take 1 tablet (12.5 mg total) by mouth every 6 (six) hours as needed for nausea or vomiting.   QUEtiapine  400 MG tablet Commonly known as: SEROQUEL  Take 2 tablets (800 mg total) by mouth at bedtime.   rivaroxaban  10 MG Tabs tablet Commonly known as: XARELTO  Take 1 tablet (10 mg  total) by mouth daily.   senna 8.6 MG Tabs tablet Commonly known as: SENOKOT Take 1 tablet (8.6 mg total) by mouth 2 (two) times daily.   talazoparib tosylate  0.35 MG capsule Commonly known as: Talzenna  Take 1 capsule (0.35 mg total) by mouth daily. Take for 21 days on, then off for 7 days. Repeat every 28 days.   Vitamin D  (Ergocalciferol ) 1.25 MG (50000 UNIT) Caps capsule Commonly known as: DRISDOL  Take 1 capsule (50,000 Units total) by mouth every 7 (seven) days.        Allergies:  Allergies  Allergen  Reactions   Lithium Other (See Comments)    Spinal fluid built up in brain   Dulaglutide Nausea And Vomiting and Other (See Comments)    TRULICITY   Nitrofuran Derivatives Itching    Nitrofurantoin  Mono- MCR   Penicillins Other (See Comments)    UNKNOWN CHILDHOOD REACTION    Past Medical History, Surgical history, Social history, and Family History were reviewed and updated.  Review of Systems:  Review of Systems  Constitutional:  Positive for malaise/fatigue and weight loss.  HENT: Negative.    Eyes: Negative.   Respiratory:  Positive for cough.   Cardiovascular:  Positive for palpitations.  Gastrointestinal:  Positive for diarrhea, nausea and vomiting.  Genitourinary: Negative.   Musculoskeletal:  Positive for back pain, falls and myalgias.  Skin: Negative.   Neurological:  Positive for dizziness and headaches.  Psychiatric/Behavioral:  The patient is nervous/anxious.      Physical Exam:  height is 5' 2 (1.575 m) and weight is 127 lb 14.4 oz (58 kg).   Wt Readings from Last 3 Encounters:  11/23/24 127 lb 14.4 oz (58 kg)  11/08/24 130 lb (59 kg)  10/23/24 135 lb (61.2 kg)    Physical Exam Vitals reviewed.  Constitutional:      Comments: Right breast shows no masses, edema or erythema.  There is no right axillary adenopathy.  Her left breast is quite contracted and hyperpigmented from radiation.  It is not tender to palpation.  It is somewhat firm in  general.  There is no obvious left axillary adenopathy.  HENT:     Head: Normocephalic and atraumatic.  Eyes:     Pupils: Pupils are equal, round, and reactive to light.  Cardiovascular:     Rate and Rhythm: Normal rate and regular rhythm.     Heart sounds: Normal heart sounds.  Pulmonary:     Effort: Pulmonary effort is normal.     Breath sounds: Normal breath sounds.  Abdominal:     General: Bowel sounds are normal.     Palpations: Abdomen is soft.  Musculoskeletal:        General: No tenderness or deformity. Normal range of motion.     Cervical back: Normal range of motion.     Comments: In the lower portion of the sternum, she does have 3 subcutaneous nodules.  There is slightly hyperpigmented.  They probably measure about 4-5 mm.  They are nontender.  Lymphadenopathy:     Cervical: No cervical adenopathy.  Skin:    General: Skin is warm and dry.     Findings: No erythema or rash.  Neurological:     Mental Status: She is alert and oriented to person, place, and time.  Psychiatric:        Behavior: Behavior normal.        Thought Content: Thought content normal.        Judgment: Judgment normal.      Lab Results  Component Value Date   WBC 2.7 (L) 11/23/2024   HGB 11.1 (L) 11/23/2024   HCT 31.8 (L) 11/23/2024   MCV 108.2 (H) 11/23/2024   PLT 165 11/23/2024   Lab Results  Component Value Date   FERRITIN 810 (H) 09/14/2024   IRON 87 09/14/2024   TIBC 304 09/14/2024   UIBC 217 09/14/2024   IRONPCTSAT 29 09/14/2024   Lab Results  Component Value Date   RETICCTPCT 3.2 (H) 07/20/2024   RBC 2.94 (L) 11/23/2024   No results found for: KPAFRELGTCHN, LAMBDASER, KAPLAMBRATIO  No results found for: IGGSERUM, IGA, IGMSERUM No results found for: STEPHANY CARLOTA BENSON MARKEL EARLA JOANNIE DOC VICK, SPEI   Chemistry      Component Value Date/Time   NA 139 11/23/2024 0857   K 3.9 11/23/2024 0857   CL 105 11/23/2024 0857    CO2 24 11/23/2024 0857   BUN 15 11/23/2024 0857   CREATININE 0.50 11/23/2024 0857      Component Value Date/Time   CALCIUM  8.9 11/23/2024 0857   ALKPHOS 75 11/23/2024 0857   AST 13 (L) 11/23/2024 0857   ALT 14 11/23/2024 0857   BILITOT 0.3 11/23/2024 0857       Impression and Plan: Ms. Falconi is a very pleasant 59 yo caucasian female with recurrent ductal carcinoma of the left breast.  Recurrence was in the neck. She was treated with incredibly aggressive chemo radiation therapy.  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   As always, there are issues that we have to deal with.  Again, I am not sure where the subcutaneous nodules are.  I will have to see if one of our surgeons might be able to see her and see about doing a biopsy of 1 of these nodules.  We will have to see about MRI of the neck to see about the thrombus in the left internal jugular vein.  Hopefully stopping the talazoparib will help with the nausea and vomiting.  She has lost weight.  Hopefully the Marinol  will help her gain a little weight.  I will plan to see her back probably within a month's time.  I know she is trying hard.  I have to give her a lot of credit for being so resilient.    Maude JONELLE Crease, MD 1/30/20264:51 PM  "

## 2024-11-24 LAB — CANCER ANTIGEN 27.29: CA 27.29: 109.1 U/mL — ABNORMAL HIGH (ref 0.0–38.6)

## 2024-11-27 ENCOUNTER — Encounter: Payer: Self-pay | Admitting: Hematology & Oncology

## 2024-11-27 ENCOUNTER — Other Ambulatory Visit: Payer: Self-pay

## 2024-11-27 ENCOUNTER — Other Ambulatory Visit (HOSPITAL_BASED_OUTPATIENT_CLINIC_OR_DEPARTMENT_OTHER): Payer: Self-pay

## 2024-11-27 ENCOUNTER — Ambulatory Visit: Payer: Self-pay | Admitting: Hematology & Oncology

## 2024-11-27 ENCOUNTER — Ambulatory Visit (HOSPITAL_BASED_OUTPATIENT_CLINIC_OR_DEPARTMENT_OTHER)
Admission: RE | Admit: 2024-11-27 | Discharge: 2024-11-27 | Disposition: A | Source: Ambulatory Visit | Attending: Hematology & Oncology | Admitting: Hematology & Oncology

## 2024-11-27 DIAGNOSIS — C50012 Malignant neoplasm of nipple and areola, left female breast: Secondary | ICD-10-CM

## 2024-11-27 MED ORDER — DRONABINOL 2.5 MG PO CAPS
2.5000 mg | ORAL_CAPSULE | Freq: Two times a day (BID) | ORAL | 0 refills | Status: AC
  Filled 2024-11-27: qty 60, 30d supply, fill #0

## 2024-11-27 NOTE — Telephone Encounter (Signed)
 Advised via MyChart.

## 2024-11-27 NOTE — Telephone Encounter (Signed)
-----   Message from Maude Crease, MD sent at 11/27/2024  2:22 PM EST ----- Please call and let her know that the chest x-ray looks okay.

## 2024-11-28 ENCOUNTER — Telehealth: Payer: Self-pay

## 2024-11-28 ENCOUNTER — Other Ambulatory Visit (HOSPITAL_COMMUNITY): Payer: Self-pay

## 2024-11-28 ENCOUNTER — Other Ambulatory Visit (HOSPITAL_BASED_OUTPATIENT_CLINIC_OR_DEPARTMENT_OTHER): Payer: Self-pay

## 2024-11-28 NOTE — Telephone Encounter (Signed)
 Oral Oncology Patient Advocate Encounter  Prior Authorization for Dronabinol  has been approved.    PA# [id number] Effective dates: 11/28/2024 through 11/28/2025   Patient has been notified via MyChart   Charlott Hamilton,  CPhT-Adv  she/her/hers [facility]  [facility] Pharmacy Technician Patient Advocate Specialist III [facility] Phone: [phone]  Fax: [phone] [email or web address]

## 2024-11-28 NOTE — Telephone Encounter (Signed)
 Oral Oncology Patient Advocate Encounter   Received notification that prior authorization for Dronabinol   is required.   PA submitted on 11/28/24 Key [id number] Status is pending      Charlott Hamilton,  CPhT-Adv  she/her/hers [facility]  [facility] Pharmacy Technician Patient Advocate Specialist III [facility] Phone: [phone]  Fax: [phone] [email or web address]

## 2024-11-29 ENCOUNTER — Other Ambulatory Visit (HOSPITAL_BASED_OUTPATIENT_CLINIC_OR_DEPARTMENT_OTHER): Payer: Self-pay

## 2024-11-29 ENCOUNTER — Telehealth: Payer: Self-pay | Admitting: Dietician

## 2024-11-29 NOTE — Telephone Encounter (Signed)
 Patient screened on MST. First attempt to reach. Provided my cell# on voice mail to return call to set up a nutrition consult.  Micheline Craven, RDN, LDN Registered Dietitian, [facility] Part Time Remote (Usual office hours: Tuesday-Thursday) Cell: [phone]

## 2024-11-30 ENCOUNTER — Other Ambulatory Visit: Payer: Self-pay | Admitting: Hematology & Oncology

## 2024-11-30 ENCOUNTER — Inpatient Hospital Stay

## 2024-11-30 ENCOUNTER — Inpatient Hospital Stay: Admitting: Family

## 2024-11-30 ENCOUNTER — Other Ambulatory Visit (HOSPITAL_BASED_OUTPATIENT_CLINIC_OR_DEPARTMENT_OTHER): Payer: Self-pay

## 2024-11-30 MED ORDER — HYDROCODONE-ACETAMINOPHEN 7.5-325 MG PO TABS
1.0000 | ORAL_TABLET | Freq: Four times a day (QID) | ORAL | 0 refills | Status: AC | PRN
  Filled 2024-11-30: qty 120, 30d supply, fill #0

## 2024-11-30 NOTE — Progress Notes (Incomplete)
{  [id number]}

## 2024-12-03 ENCOUNTER — Ambulatory Visit: Admitting: Family Medicine

## 2024-12-11 ENCOUNTER — Ambulatory Visit (HOSPITAL_BASED_OUTPATIENT_CLINIC_OR_DEPARTMENT_OTHER)

## 2024-12-17 ENCOUNTER — Ambulatory Visit: Admitting: Family Medicine

## 2024-12-21 ENCOUNTER — Inpatient Hospital Stay: Attending: Hematology & Oncology

## 2024-12-21 ENCOUNTER — Inpatient Hospital Stay

## 2024-12-21 ENCOUNTER — Inpatient Hospital Stay: Admitting: Hematology & Oncology

## 2024-12-28 ENCOUNTER — Inpatient Hospital Stay

## 2024-12-28 ENCOUNTER — Inpatient Hospital Stay: Admitting: Hematology & Oncology

## 2025-01-18 ENCOUNTER — Inpatient Hospital Stay

## 2025-01-18 ENCOUNTER — Inpatient Hospital Stay: Attending: Hematology & Oncology

## 2025-01-18 ENCOUNTER — Inpatient Hospital Stay: Admitting: Hematology & Oncology

## 2025-02-15 ENCOUNTER — Inpatient Hospital Stay: Admitting: Hematology & Oncology

## 2025-02-15 ENCOUNTER — Inpatient Hospital Stay

## 2025-02-15 ENCOUNTER — Inpatient Hospital Stay: Attending: Hematology & Oncology
